# Patient Record
Sex: Female | Born: 1955 | Race: White | Hispanic: No | Marital: Married
Health system: Southern US, Academic
[De-identification: ages and names within clinical notes are randomized; demographics above are authoritative.]

## PROBLEM LIST (undated history)

## (undated) ENCOUNTER — Ambulatory Visit

## (undated) ENCOUNTER — Encounter

## (undated) ENCOUNTER — Ambulatory Visit
Payer: MEDICARE | Attending: Student in an Organized Health Care Education/Training Program | Primary: Student in an Organized Health Care Education/Training Program

## (undated) ENCOUNTER — Ambulatory Visit: Payer: MEDICARE | Attending: Rheumatology | Primary: Rheumatology

## (undated) ENCOUNTER — Telehealth

## (undated) ENCOUNTER — Encounter: Attending: Dermatology | Primary: Dermatology

## (undated) ENCOUNTER — Ambulatory Visit: Payer: MEDICARE

## (undated) ENCOUNTER — Encounter
Attending: Student in an Organized Health Care Education/Training Program | Primary: Student in an Organized Health Care Education/Training Program

## (undated) DIAGNOSIS — Q2112 Patent foramen ovale: Secondary | ICD-10-CM

## (undated) DIAGNOSIS — I471 Supraventricular tachycardia: Secondary | ICD-10-CM

## (undated) DIAGNOSIS — I341 Nonrheumatic mitral (valve) prolapse: Secondary | ICD-10-CM

## (undated) DIAGNOSIS — T4145XA Adverse effect of unspecified anesthetic, initial encounter: Secondary | ICD-10-CM

## (undated) DIAGNOSIS — E063 Autoimmune thyroiditis: Secondary | ICD-10-CM

## (undated) DIAGNOSIS — E781 Pure hyperglyceridemia: Secondary | ICD-10-CM

## (undated) DIAGNOSIS — M549 Dorsalgia, unspecified: Secondary | ICD-10-CM

## (undated) DIAGNOSIS — R5382 Chronic fatigue, unspecified: Secondary | ICD-10-CM

## (undated) DIAGNOSIS — I4719 Other supraventricular tachycardia: Secondary | ICD-10-CM

## (undated) DIAGNOSIS — M310 Hypersensitivity angiitis: Secondary | ICD-10-CM

## (undated) DIAGNOSIS — L719 Rosacea, unspecified: Secondary | ICD-10-CM

## (undated) DIAGNOSIS — C55 Malignant neoplasm of uterus, part unspecified: Secondary | ICD-10-CM

## (undated) DIAGNOSIS — K219 Gastro-esophageal reflux disease without esophagitis: Secondary | ICD-10-CM

## (undated) DIAGNOSIS — Z9889 Other specified postprocedural states: Secondary | ICD-10-CM

## (undated) DIAGNOSIS — C541 Malignant neoplasm of endometrium: Secondary | ICD-10-CM

## (undated) DIAGNOSIS — G9332 Myalgic encephalomyelitis/chronic fatigue syndrome: Secondary | ICD-10-CM

## (undated) DIAGNOSIS — IMO0001 Reserved for inherently not codable concepts without codable children: Secondary | ICD-10-CM

## (undated) DIAGNOSIS — R05 Cough: Secondary | ICD-10-CM

## (undated) DIAGNOSIS — R413 Other amnesia: Secondary | ICD-10-CM

## (undated) DIAGNOSIS — D510 Vitamin B12 deficiency anemia due to intrinsic factor deficiency: Secondary | ICD-10-CM

## (undated) DIAGNOSIS — J45909 Unspecified asthma, uncomplicated: Secondary | ICD-10-CM

## (undated) DIAGNOSIS — M79673 Pain in unspecified foot: Secondary | ICD-10-CM

## (undated) DIAGNOSIS — R112 Nausea with vomiting, unspecified: Secondary | ICD-10-CM

## (undated) DIAGNOSIS — T8859XA Other complications of anesthesia, initial encounter: Secondary | ICD-10-CM

## (undated) DIAGNOSIS — M797 Fibromyalgia: Secondary | ICD-10-CM

## (undated) DIAGNOSIS — G2581 Restless legs syndrome: Secondary | ICD-10-CM

## (undated) DIAGNOSIS — E039 Hypothyroidism, unspecified: Secondary | ICD-10-CM

## (undated) DIAGNOSIS — R059 Cough, unspecified: Secondary | ICD-10-CM

## (undated) DIAGNOSIS — G4730 Sleep apnea, unspecified: Secondary | ICD-10-CM

## (undated) DIAGNOSIS — E786 Lipoprotein deficiency: Secondary | ICD-10-CM

## (undated) DIAGNOSIS — I499 Cardiac arrhythmia, unspecified: Secondary | ICD-10-CM

## (undated) DIAGNOSIS — K224 Dyskinesia of esophagus: Secondary | ICD-10-CM

## (undated) HISTORY — PX: BUNIONECTOMY: SHX129

## (undated) HISTORY — DX: Restless legs syndrome: G25.81

## (undated) HISTORY — DX: Supraventricular tachycardia: I47.1

## (undated) HISTORY — DX: Dorsalgia, unspecified: M54.9

## (undated) HISTORY — DX: Malignant neoplasm of uterus, part unspecified: C55

## (undated) HISTORY — DX: Myalgic encephalomyelitis/chronic fatigue syndrome: G93.32

## (undated) HISTORY — DX: Gastro-esophageal reflux disease without esophagitis: K21.9

## (undated) HISTORY — PX: US ECHOCARDIOGRAPHY: HXRAD669

## (undated) HISTORY — DX: Reserved for inherently not codable concepts without codable children: IMO0001

## (undated) HISTORY — DX: Autoimmune thyroiditis: E06.3

## (undated) HISTORY — DX: Other amnesia: R41.3

## (undated) HISTORY — PX: LUNG BIOPSY: SHX5088

## (undated) HISTORY — PX: HAMMER TOE SURGERY: SHX385

## (undated) HISTORY — PX: METATARSAL OSTEOTOMY: SHX1641

## (undated) HISTORY — DX: Lipoprotein deficiency: E78.6

## (undated) HISTORY — DX: Cough, unspecified: R05.9

## (undated) HISTORY — PX: NECK SURGERY: SHX720

## (undated) HISTORY — DX: Malignant neoplasm of endometrium: C54.1

## (undated) HISTORY — PX: COLONOSCOPY: SHX5424

## (undated) HISTORY — PX: VESICOVAGINAL FISTULA CLOSURE W/ TAH: SUR271

## (undated) HISTORY — PX: OTHER SURGICAL HISTORY: SHX169

## (undated) HISTORY — PX: TONSILLECTOMY: SUR1361

## (undated) HISTORY — DX: Other supraventricular tachycardia: I47.19

## (undated) HISTORY — DX: Pain in unspecified foot: M79.673

## (undated) HISTORY — DX: Dyskinesia of esophagus: K22.4

## (undated) HISTORY — DX: Chronic fatigue, unspecified: R53.82

## (undated) HISTORY — DX: Pure hyperglyceridemia: E78.1

## (undated) HISTORY — DX: Hypothyroidism, unspecified: E03.9

## (undated) HISTORY — PX: CHOLECYSTECTOMY: SHX55

## (undated) HISTORY — DX: Hypersensitivity angiitis: M31.0

## (undated) HISTORY — DX: Rosacea, unspecified: L71.9

## (undated) HISTORY — DX: Vitamin B12 deficiency anemia due to intrinsic factor deficiency: D51.0

## (undated) HISTORY — DX: Cough: R05

## (undated) HISTORY — PX: ABDOMINAL HYSTERECTOMY: SHX81

---
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
## 1998-01-20 ENCOUNTER — Other Ambulatory Visit: Admission: RE | Admit: 1998-01-20 | Discharge: 1998-01-20 | Payer: Self-pay | Admitting: *Deleted

## 1998-05-24 ENCOUNTER — Ambulatory Visit (HOSPITAL_COMMUNITY): Admission: RE | Admit: 1998-05-24 | Discharge: 1998-05-24 | Payer: Self-pay | Admitting: Family Medicine

## 1998-09-07 ENCOUNTER — Other Ambulatory Visit: Admission: RE | Admit: 1998-09-07 | Discharge: 1998-09-07 | Payer: Self-pay | Admitting: *Deleted

## 1999-03-19 ENCOUNTER — Emergency Department (HOSPITAL_COMMUNITY): Admission: EM | Admit: 1999-03-19 | Discharge: 1999-03-19 | Payer: Self-pay | Admitting: *Deleted

## 1999-03-19 ENCOUNTER — Encounter: Payer: Self-pay | Admitting: Emergency Medicine

## 1999-09-26 ENCOUNTER — Other Ambulatory Visit: Admission: RE | Admit: 1999-09-26 | Discharge: 1999-09-26 | Payer: Self-pay | Admitting: Obstetrics & Gynecology

## 2000-06-01 ENCOUNTER — Encounter: Payer: Self-pay | Admitting: Emergency Medicine

## 2000-06-01 ENCOUNTER — Emergency Department (HOSPITAL_COMMUNITY): Admission: EM | Admit: 2000-06-01 | Discharge: 2000-06-02 | Payer: Self-pay | Admitting: Emergency Medicine

## 2000-08-16 ENCOUNTER — Ambulatory Visit (HOSPITAL_COMMUNITY): Admission: RE | Admit: 2000-08-16 | Discharge: 2000-08-16 | Payer: Self-pay | Admitting: Gastroenterology

## 2000-08-16 ENCOUNTER — Encounter (INDEPENDENT_AMBULATORY_CARE_PROVIDER_SITE_OTHER): Payer: Self-pay | Admitting: Specialist

## 2000-11-22 ENCOUNTER — Other Ambulatory Visit: Admission: RE | Admit: 2000-11-22 | Discharge: 2000-11-22 | Payer: Self-pay | Admitting: *Deleted

## 2001-12-12 ENCOUNTER — Encounter: Admission: RE | Admit: 2001-12-12 | Discharge: 2001-12-12 | Payer: Self-pay | Admitting: Gastroenterology

## 2001-12-12 ENCOUNTER — Encounter: Payer: Self-pay | Admitting: Gastroenterology

## 2003-07-07 ENCOUNTER — Encounter: Admission: RE | Admit: 2003-07-07 | Discharge: 2003-10-05 | Payer: Self-pay | Admitting: Family Medicine

## 2003-07-25 ENCOUNTER — Emergency Department (HOSPITAL_COMMUNITY): Admission: EM | Admit: 2003-07-25 | Discharge: 2003-07-25 | Payer: Self-pay | Admitting: Emergency Medicine

## 2003-07-25 ENCOUNTER — Encounter: Payer: Self-pay | Admitting: Emergency Medicine

## 2003-12-23 ENCOUNTER — Encounter: Admission: RE | Admit: 2003-12-23 | Discharge: 2003-12-23 | Payer: Self-pay | Admitting: Family Medicine

## 2004-01-06 ENCOUNTER — Encounter: Admission: RE | Admit: 2004-01-06 | Discharge: 2004-01-06 | Payer: Self-pay | Admitting: Family Medicine

## 2005-03-02 ENCOUNTER — Ambulatory Visit (HOSPITAL_BASED_OUTPATIENT_CLINIC_OR_DEPARTMENT_OTHER): Admission: RE | Admit: 2005-03-02 | Discharge: 2005-03-02 | Payer: Self-pay | Admitting: Urology

## 2005-07-19 ENCOUNTER — Ambulatory Visit (HOSPITAL_COMMUNITY): Admission: RE | Admit: 2005-07-19 | Discharge: 2005-07-19 | Payer: Self-pay | Admitting: Family Medicine

## 2005-08-22 ENCOUNTER — Ambulatory Visit (HOSPITAL_COMMUNITY): Admission: RE | Admit: 2005-08-22 | Discharge: 2005-08-22 | Payer: Self-pay | Admitting: Gastroenterology

## 2005-08-22 ENCOUNTER — Encounter (INDEPENDENT_AMBULATORY_CARE_PROVIDER_SITE_OTHER): Payer: Self-pay | Admitting: *Deleted

## 2006-10-30 HISTORY — PX: LYMPH NODE DISSECTION: SHX5087

## 2006-10-30 HISTORY — PX: HYSTERECTOMY ABDOMINAL WITH SALPINGO-OOPHORECTOMY: SHX6792

## 2007-01-02 ENCOUNTER — Ambulatory Visit (HOSPITAL_BASED_OUTPATIENT_CLINIC_OR_DEPARTMENT_OTHER): Admission: RE | Admit: 2007-01-02 | Discharge: 2007-01-02 | Payer: Self-pay | Admitting: Urology

## 2007-10-31 HISTORY — PX: CHOLECYSTECTOMY: SHX55

## 2007-12-19 ENCOUNTER — Inpatient Hospital Stay (HOSPITAL_COMMUNITY): Admission: RE | Admit: 2007-12-19 | Discharge: 2007-12-20 | Payer: Self-pay | Admitting: Orthopedic Surgery

## 2008-02-19 ENCOUNTER — Ambulatory Visit (HOSPITAL_COMMUNITY): Admission: RE | Admit: 2008-02-19 | Discharge: 2008-02-19 | Payer: Self-pay | Admitting: Dermatology

## 2008-02-19 ENCOUNTER — Encounter (INDEPENDENT_AMBULATORY_CARE_PROVIDER_SITE_OTHER): Payer: Self-pay | Admitting: Interventional Radiology

## 2008-05-13 ENCOUNTER — Ambulatory Visit (HOSPITAL_BASED_OUTPATIENT_CLINIC_OR_DEPARTMENT_OTHER): Admission: RE | Admit: 2008-05-13 | Discharge: 2008-05-13 | Payer: Self-pay | Admitting: Urology

## 2009-02-11 ENCOUNTER — Encounter: Admission: RE | Admit: 2009-02-11 | Discharge: 2009-02-11 | Payer: Self-pay | Admitting: Gastroenterology

## 2009-02-16 ENCOUNTER — Encounter (INDEPENDENT_AMBULATORY_CARE_PROVIDER_SITE_OTHER): Payer: Self-pay | Admitting: General Surgery

## 2009-02-16 ENCOUNTER — Ambulatory Visit (HOSPITAL_COMMUNITY): Admission: RE | Admit: 2009-02-16 | Discharge: 2009-02-17 | Payer: Self-pay | Admitting: General Surgery

## 2009-05-05 ENCOUNTER — Ambulatory Visit (HOSPITAL_BASED_OUTPATIENT_CLINIC_OR_DEPARTMENT_OTHER): Admission: RE | Admit: 2009-05-05 | Discharge: 2009-05-06 | Payer: Self-pay | Admitting: Urology

## 2010-05-26 ENCOUNTER — Encounter: Admission: RE | Admit: 2010-05-26 | Discharge: 2010-05-26 | Payer: Self-pay | Admitting: Family Medicine

## 2010-06-23 ENCOUNTER — Ambulatory Visit: Payer: Self-pay | Admitting: Internal Medicine

## 2010-06-24 ENCOUNTER — Encounter: Payer: Self-pay | Admitting: Internal Medicine

## 2010-11-21 ENCOUNTER — Encounter
Admission: RE | Admit: 2010-11-21 | Discharge: 2010-11-21 | Payer: Self-pay | Source: Home / Self Care | Attending: Obstetrics and Gynecology | Admitting: Obstetrics and Gynecology

## 2010-12-01 NOTE — Miscellaneous (Signed)
Summary: Orders Update pft charges  Clinical Lists Changes  Orders: Added new Service order of Carbon Monoxide diffusing w/capacity (94720) - Signed Added new Service order of Lung Volumes (94240) - Signed Added new Service order of Spirometry (Pre & Post) (94060) - Signed 

## 2011-01-16 ENCOUNTER — Ambulatory Visit
Admission: RE | Admit: 2011-01-16 | Discharge: 2011-01-16 | Disposition: A | Discharge status: Home / Self Care | Payer: Self-pay | Source: Ambulatory Visit | Attending: Orthopedic Surgery | Admitting: Orthopedic Surgery

## 2011-01-16 ENCOUNTER — Other Ambulatory Visit: Payer: Self-pay | Admitting: Orthopedic Surgery

## 2011-01-16 DIAGNOSIS — M5090 Cervical disc disorder, unspecified, unspecified cervical region: Secondary | ICD-10-CM

## 2011-01-27 ENCOUNTER — Other Ambulatory Visit: Payer: Self-pay | Admitting: Orthopedic Surgery

## 2011-01-27 DIAGNOSIS — M542 Cervicalgia: Secondary | ICD-10-CM

## 2011-01-31 ENCOUNTER — Ambulatory Visit
Admission: RE | Admit: 2011-01-31 | Discharge: 2011-01-31 | Disposition: A | Discharge status: Home / Self Care | Payer: Medicare Other | Source: Ambulatory Visit | Attending: Orthopedic Surgery | Admitting: Orthopedic Surgery

## 2011-01-31 DIAGNOSIS — M542 Cervicalgia: Secondary | ICD-10-CM

## 2011-02-05 LAB — POCT I-STAT 4, (NA,K, GLUC, HGB,HCT)
HCT: 43 % (ref 36.0–46.0)
Sodium: 141 mEq/L (ref 135–145)

## 2011-02-08 LAB — DIFFERENTIAL
Eosinophils Absolute: 0.2 10*3/uL (ref 0.0–0.7)
Eosinophils Relative: 3 % (ref 0–5)
Lymphs Abs: 1.4 10*3/uL (ref 0.7–4.0)
Monocytes Relative: 8 % (ref 3–12)
Neutro Abs: 5 10*3/uL (ref 1.7–7.7)
Neutrophils Relative %: 69 % (ref 43–77)

## 2011-02-08 LAB — COMPREHENSIVE METABOLIC PANEL
ALT: 16 U/L (ref 0–35)
Albumin: 4 g/dL (ref 3.5–5.2)
BUN: 10 mg/dL (ref 6–23)
CO2: 29 mEq/L (ref 19–32)
Calcium: 9.2 mg/dL (ref 8.4–10.5)
Chloride: 102 mEq/L (ref 96–112)
Creatinine, Ser: 1.01 mg/dL (ref 0.4–1.2)
Glucose, Bld: 96 mg/dL (ref 70–99)
Total Protein: 7.4 g/dL (ref 6.0–8.3)

## 2011-02-08 LAB — CBC
MCHC: 34.5 g/dL (ref 30.0–36.0)
MCV: 89.1 fL (ref 78.0–100.0)
Platelets: 331 10*3/uL (ref 150–400)
RDW: 12.6 % (ref 11.5–15.5)
WBC: 7.2 10*3/uL (ref 4.0–10.5)

## 2011-03-01 ENCOUNTER — Inpatient Hospital Stay (HOSPITAL_COMMUNITY)
Admission: RE | Admit: 2011-03-01 | Discharge: 2011-03-02 | DRG: 473 | Disposition: A | Discharge status: Home / Self Care | Payer: PRIVATE HEALTH INSURANCE | Source: Ambulatory Visit | Attending: Orthopedic Surgery | Admitting: Orthopedic Surgery

## 2011-03-01 ENCOUNTER — Ambulatory Visit (HOSPITAL_COMMUNITY): Payer: PRIVATE HEALTH INSURANCE

## 2011-03-01 DIAGNOSIS — M5380 Other specified dorsopathies, site unspecified: Secondary | ICD-10-CM | POA: Diagnosis present

## 2011-03-01 DIAGNOSIS — K219 Gastro-esophageal reflux disease without esophagitis: Secondary | ICD-10-CM | POA: Diagnosis present

## 2011-03-01 DIAGNOSIS — K589 Irritable bowel syndrome without diarrhea: Secondary | ICD-10-CM | POA: Diagnosis present

## 2011-03-01 DIAGNOSIS — IMO0001 Reserved for inherently not codable concepts without codable children: Secondary | ICD-10-CM | POA: Diagnosis present

## 2011-03-01 DIAGNOSIS — D649 Anemia, unspecified: Secondary | ICD-10-CM | POA: Diagnosis present

## 2011-03-01 DIAGNOSIS — J45909 Unspecified asthma, uncomplicated: Secondary | ICD-10-CM | POA: Diagnosis present

## 2011-03-01 DIAGNOSIS — F329 Major depressive disorder, single episode, unspecified: Secondary | ICD-10-CM | POA: Diagnosis present

## 2011-03-01 DIAGNOSIS — Z79899 Other long term (current) drug therapy: Secondary | ICD-10-CM

## 2011-03-01 DIAGNOSIS — M5020 Other cervical disc displacement, unspecified cervical region: Principal | ICD-10-CM | POA: Diagnosis present

## 2011-03-01 DIAGNOSIS — F3289 Other specified depressive episodes: Secondary | ICD-10-CM | POA: Diagnosis present

## 2011-03-01 LAB — CBC
HCT: 36.6 % (ref 36.0–46.0)
Hemoglobin: 12.7 g/dL (ref 12.0–15.0)
Hemoglobin: 13 g/dL (ref 12.0–15.0)
MCHC: 34.4 g/dL (ref 30.0–36.0)
Platelets: 391 10*3/uL (ref 150–400)
RBC: 3.86 MIL/uL — ABNORMAL LOW (ref 3.87–5.11)
RDW: 14.6 % (ref 11.5–15.5)
RDW: 14.5 % (ref 11.5–15.5)
WBC: 11.7 10*3/uL — ABNORMAL HIGH (ref 4.0–10.5)

## 2011-03-01 LAB — BASIC METABOLIC PANEL
Calcium: 9.7 mg/dL (ref 8.4–10.5)
Calcium: 9.7 mg/dL (ref 8.4–10.5)
Chloride: 100 mEq/L (ref 96–112)
Creatinine, Ser: 0.9 mg/dL (ref 0.4–1.2)
GFR calc Af Amer: >60 mL/min (ref >60)
GFR calc non Af Amer: >60 mL/min (ref >60)
GFR calc non Af Amer: >60 mL/min (ref >60)
Potassium: 4.3 mEq/L (ref 3.5–5.1)
Potassium: 4.5 mEq/L (ref 3.5–5.1)
Sodium: 136 mEq/L (ref 135–145)

## 2011-03-01 LAB — SURGICAL PCR SCREEN: MRSA, PCR: NEGATIVE

## 2011-03-02 NOTE — Op Note (Signed)
[name], [name]                  ACCOUNT NO.:  [id number]  MEDICAL RECORD NO.:  [id number]           PATIENT TYPE:  I  LOCATION:  5011                         FACILITY:  [facility]  PHYSICIAN:  Alvy Beal, MD    DATE OF BIRTH:  February 10, 1956  DATE OF PROCEDURE:  03/01/2011 DATE OF DISCHARGE:                              OPERATIVE REPORT   PREOPERATIVE DIAGNOSIS:  C6-7 disk herniation.  POSTOPERATIVE DIAGNOSIS:  C6-7 disk herniation.  OPERATIVE PROCEDURE:  Removal of exostosis at C5-6, 2ACDF at C6-7.  COMPLICATIONS:  None.  CONDITION:  Stable.  HISTORY:  This is a very pleasant 55 year old who 3 years ago had a C5-6 total disk replacement, has done exceptionally well.  Recently, she started having significant left arm pain, weakness, and severe pain. Clinically, she had weakness in the triceps in the C7 distribution on the left side as well as numbness and dysesthesias.  CT myelogram demonstrated the disk herniation at C6-7.  As a result, we elected to proceed with surgery.  All appropriate risks, benefits, and alternatives were discussed with the patient.  Consent was obtained.  INSTRUMENTATION SYSTEM USED:  Synthes zero profile anterior cervical PEEK cage and plate with a 04-VW down screw and 14 mm up screw.  OPERATIVE NOTE:  The patient was brought to the operating room, placed supine on the operating table.  After successful induction of general anesthesia and endotracheal intubation, TEDs and SCDs were applied. Rolled towels were placed in the shoulders.  The shoulders were taped down.  Neck was prepped and draped in standard fashion.  Appropriate time-out was then done to confirm patient and procedure, affected extremity, and all other pertinent important data.  Because of the previous left-sided approach, I elected to go on the right in order to decrease the scar mobilization.  She did have preoperative ENT clearance, which demonstrated normal vocal cord motion.   Midline incision and transverse was made over the C6-7 disk space.  Sharp dissection was carried out down deep to the platysma.  Platysma was sharply incised.  I continued dissecting along the medial border of sternocleidomastoid.  I identified the carotid sheath and protected it laterally with a finger, mobilize the trachea and esophagus medially, and protected it with a thyroid retractor.  I then dissected down to the remaining deep cervical and prevertebral fascia to the anterior longitudinal ligament.  At this point, I could clearly visualize the anterior cervical spine.  I placed a needle into the C6-7 disk and confirmed that I was at the appropriate level.  I then mobilized the anterior longitudinal ligament at the level of the disk space.  I then mobilized the longus coli muscles out laterally to expose the uncovertebral joints.  Self-retaining Caspar retractor was placed in the wound, expanded, endotracheal cuff was deflated and reinflated.  It was held in place and then distraction pins were placed into bodies of C6-7 and C6-7 disk space was distracted.  An annulotomy was performed with 15- blade scalpel and then I used a combination to a tear pituitary rongeurs, curettes, and Kerrison rongeurs to remove all of the  disk material.  There was a fragment of disk material in the posterior lateral aspect on the left side, which I was able to remove with a micropituitary rongeur.  I then took down the osteophyte.  I then used the nerve hook to create a defect in the posterior longitudinal ligament and then resected the posterior longitudinal ligament with a #1 Kerrison punch.  At this point, I can then pass the nerve hook underneath the uncovertebral joints bilaterally and centrally and there was no further compression.  The thecal sac and nerve root were well decompressed.  I rasped the endplates, I had bleeding bone, and then I trialed with the spacers, took a 7 mm lordotic zero  profile plate, packed it with 2 mL of Osteocel mixed with DBX and malleted into position.  I had excellent positioning.  I then used an awl and placed a 40-mm screw through the plate and into the C6 vertebral body and 16-mm screw into the C7 vertebral body.  The screws were torqued to the appropriate depth.  I then evaluated C5-6 prosthesis.  There was a small osteophyte, which I was able to resect with 1-mm Kerrison.  I did not do an aggressive exostosis for fear of damaging the disk prosthesis itself.  I was able to remove some osteophyte.  I irrigated copiously normal saline, returned the trachea and esophagus midline, closed the platysma with interrupted 2-0 Vicryl sutures and 3-0 Monocryl for the skin.  Steri- Strips and dry dressing were applied.  The patient was extubated and transferred to PACU without incident.  At the end of the case, all needle and sponge counts were correct.     Alvy Beal, MD     DDB/MEDQ  D:  03/01/2011  T:  03/02/2011  Job:  [id number]  Electronically Signed by Venita Lick MD on 03/02/2011 09:42:09 PM

## 2011-03-14 NOTE — Op Note (Signed)
NAME:  [name], [name]                  ACCOUNT NO.:  [id number]   MEDICAL RECORD NO.:  [id number]          PATIENT TYPE:  OIB   LOCATION:  5118                         FACILITY:  [facility]   PHYSICIAN:  Ollen Gross. Vernell Morgans, M.D. DATE OF BIRTH:  [birth year]   DATE OF PROCEDURE:  02/16/2009  DATE OF DISCHARGE:                               OPERATIVE REPORT   PREOPERATIVE DIAGNOSIS:  Gallstones.   POSTOPERATIVE DIAGNOSIS:  Gallstones.   PROCEDURE:  Laparoscopic cholecystectomy with intraoperative  cholangiogram.   SURGEON:  Ollen Gross. Vernell Morgans, MD   ASSISTANT:  Anselm Pancoast. Zachery Dakins, MD   ANESTHESIA:  General endotracheal.   PROCEDURE:  After informed consent was obtained, the patient was brought  to the operating room and placed in a supine position on the operating  room table.  After adequate induction of general anesthesia, the  patient's abdomen was prepped with ChloraPrep and allowed to dry and  then draped in usual sterile fashion.  The area below the umbilicus was  infiltrated with 0.25% Marcaine.  A small incision was made with a 15  blade knife.  This incision was carried down through the subcutaneous  tissue bluntly with a hemostat and [employer] retractors until the linea  alba was identified.  The linea alba was incised with a 15 blade knife  and each side was grasped with Kocher clamps and elevated anteriorly.  The preperitoneal space was then probed bluntly with a hemostat until  the peritoneum was opened and access was gained to the abdominal cavity.  A 0 Vicryl pursestring stitch was placed in the fascia around the  opening.  A Hasson cannula was placed through the opening and anchored  in place with a previously placed Vicryl pursestring stitch.  The  abdomen was then insufflated with carbon dioxide without difficulty.  The patient was placed in reverse Trendelenburg position and rotated  with the right side up.  A laparoscope was then inserted through the  Hasson cannula and  the right upper quadrant was inspected.  The dome of  the gallbladder and liver were readily identified.  Next, the epigastric  region was infiltrated with 0.25% Marcaine.  A small incision was made  with 15 blade knife and 10 and 11 mm port was placed bluntly through  this incision into the abdominal cavity under direct vision.  Sites were  then chosen on the right side of the abdomen for placement of 5-mm  ports.  Each of these areas was infiltrated with 0.25% Marcaine.  Small  stab incisions were made with a 15 blade knife and 5 mm ports were  placed bluntly through these incisions into the abdominal cavity under  direct vision.  The patient's abdomen was actually very free of  adhesions on the anterior abdominal wall even though she had had  previous abdominal surgery.  A blunt grasper was placed at the lateral  most 5 mm port and used to grasp the dome of the gallbladder and  elevated anteriorly and superiorly.  Another blunt grasper was placed  through the other 5 mm port and used  to retract the body and neck of the  gallbladder.  A small filmy adhesion along the body and down near the  neck of the gallbladder was taken down sharply with laparoscopic  scissors.  A blunt dissector was then placed through the epigastric port  and using the electrocautery, the peritoneal reflection at the  gallbladder neck was opened.  Blunt dissection was then carried out in  this area until the gallbladder neck cystic duct junction was readily  identified and a good window was created.  A single clip was placed on  the gallbladder neck.  A small ductotomy was made just below the clip  with laparoscopic scissors.  A Cook catheter was then placed  percutaneously through the anterior abdominal wall under direct vision.  The [facility] catheter was then flushed and then placed within the cystic  duct and anchored in place with a clip.  Cholangiogram was obtained that  showed no filling defects, good emptying in  duodenum and good length on  the cystic duct.  The anchoring clip and catheters were then removed  from the patient.  Three clips were placed proximally on the cystic duct  and divided.  The dome was divided between the two sets of clips.  Posterior to this, the cystic artery was identified and again dissected  bluntly in circumferential manner until a good window was created.  Two  clips were placed proximally on the artery and one distally and the  artery was divided between the two.  Next, a laparoscopic hook cautery  device was used to separate the gallbladder from liver bed.  Prior to  completely detaching all of gallbladder from liver bed, the liver bed  was inspected and several small bleeding points were coagulated with the  electrocautery until the area was completely hemostatic.  The  gallbladder was then detached and wrested away from the liver bed  without difficulty with the hook cautery.  A laparoscopic bag was  inserted through the epigastric port.  The gallbladder was placed in the  bag and the bag was sealed.  The abdomen was then irrigated with copious  amounts of saline until the effluent was clear.  The liver bed was  inspected again and looked good and found to be hemostatic.  The  laparoscope was then removed from the epigastric port.  A gallbladder  grasper was placed through the Hasson cannula used to grasp and open the  bag.  The bag with the gallbladder was removed with the Hasson cannula  through the infraumbilical port without difficulty.  The fascial defect  was closed with the previously placed Vicryl pursestring stitch as well  as with another figure-of-eight 0 Vicryl stitch.  The rest of the ports  were removed under direct vision and were found to be hemostatic.  The  skin incisions were all closed with interrupted 4-0 Monocryl  subcuticular stitches and Dermabond dressings were applied.  The patient  tolerated the procedure well.  At the end of the case  all needle, sponge  and instrument counts were correct.  The patient was then extubated and  taken to recovery in stable condition.      Ollen Gross. Vernell Morgans, M.D.  Electronically Signed     PST/MEDQ  D:  02/16/2009  T:  02/16/2009  Job:  [id number]

## 2011-03-14 NOTE — Op Note (Signed)
[name], [name]                  ACCOUNT NO.:  [id number]   MEDICAL RECORD NO.:  [id number]          PATIENT TYPE:  AMB   LOCATION:  SDS                          FACILITY:  [facility]   PHYSICIAN:  Alvy Beal, MD    DATE OF BIRTH:  11/26/1955   DATE OF PROCEDURE:  12/19/2007  DATE OF DISCHARGE:                               OPERATIVE REPORT   PREOPERATIVE DIAGNOSIS:  C5-6 disk herniation with left C6 radiculopathy  and weakness.   POSTOPERATIVE DIAGNOSIS:  C5-6 disk herniation with left C6  radiculopathy and weakness.   OPERATIVE PROCEDURE:  Cervical total disk replacement, C5-6.   FIRST ASSISTANT:  Crissie Reese, PA   COMPLICATIONS:  None.   INSTRUMENTATION USED:  Synthes ProDisc-C, size 5.   HISTORY:  Is a very pleasant 55 year old woman who presents with acute  onset of severe neck and left arm pain.  Clinical and radiographic  analysis confirmed the diagnosis of C6 radicular weakness and pain as  well as a C5-6 06 disk herniation posterolateral to the left.  As a  result, appropriate treatment modalities were discussed with the patient  and husband and she elected to proceed with surgery.  All appropriate  risks, benefits and alternatives to the case were discussed with the  patient and her husband and consent was obtained.   OPERATIVE NOTE:  The patient is brought to the operating room, placed  supine on the operating table.  After successful induction of general  anesthesia and endotracheal intubation, TED SCDs were applied.  Rolled  towels were placed between the shoulder blades.  The shoulders  themselves were taped down and the neck was put into a gentle extension.  The anterior cervical spine was prepped and draped in standard fashion.  A left transverse incision was then made just below the thyroid  cartilage.  Sharp dissection was carried out down to the platysma.  The  platysma was sharply incised in accordance with a standard Clementeen Graham approach to  the anterior cervical spine.  I then identified the  omohyoid.  Because this was directly crossing the field, I elected to  sacrifice it so we could improve visualization of the C5-6 disk space.  The omohyoid was sacrificed and hemostasis was obtained.  I then was  able to sharply dissect through the deep cervical and prevertebral  fascia onto the anterior aspect of the spine.  At this point I was able  to sweep the trachea and esophagus medially and protect the carotid  sheath laterally.  I then placed a needle into the C5-6 space, took a  lateral fluoroscopy view and confirmed the appropriate position.   Once confirmed, I then I then mobilized the longus colli muscles  bilaterally out to the level of uncovertebrals.  At this point I had the  C5-6 0506 disk space clearly visualize from uncovertebral joint to  uncovertebral joint.  I then in the AP plane found the midline utilizing  the spinous processes and marker.  I then placed the awl into the  midline position and a distraction pin into the  bodies of C5 and C6.  In  the lateral plane it was assured that the pins were parallel to the  inferior endplate of C5 and the superior endplate of C6, respectively.  It should be noted that prior to this I did place the shadow line  retractor system into the wound, deflating the endotracheal cuff, and  expanded it to the appropriate length.  At this point I had excellent  mediolateral, superoinferior visualization of the C5-6 disk space.   At this point with the retractors in place, I then incised the annulus  with a 15 blade scalpel.   Using a combination of pituitary rongeur, Leksell rongeurs, curettes and  Kerrisons, I resected all of the disk material.  There was a sizeable  posterolateral central in both to the left disk herniation, which I was  able to remove.  I then developed a plane underneath the posterior  longitudinal ligament and then resected it with a 1-mm Kerrison.  At  this  point I was able to sweep a nerve hook behind the bodies of C5 and  C6 and out over into the uncovertebral joints to assure that there were  no retained free fragments of material or significant osteophytes.   The wound was copiously irrigated with normal saline and then we began  trialling.   Once at the appropriate-size trial, I checked its position in the AP and  lateral planes, confirming depth and center of position.  Once confirmed  I placed the milling guide over it and then milled the keel cuts  superior and inferior.  I then used the chisel to ensure that I had the  appropriate depth of the keel.  Once confirmed, I then cleaned out the  keel cuts and then obtained a size 5 prosthesis.  I then malleted the 5  prosthesis down to the appropriate depth.  I then irrigated copiously  with normal saline and then removed all the retracting devices and used  bone wax to ensure that there was no ongoing bleeding.  The keel cuts  and distraction pin holes were sealed with bone wax and any soft tissue  bleeders were coagulated with bipolar electrocautery.  I irrigated the  wound copiously with normal saline and at no point did I have any  significant blood-tinged irrigant coming out of the wound.  I then under  live fluoroscopy took the neck through a full range of motion and the  prosthesis itself was noted to be functioning adequately.  At this point  I then took final AP and lateral views and placed the trachea and  esophagus back to the midline.  I then closed the platysma with  interrupted 2-0 Vicryl sutures and skin with 3-0 Monocryl.  Steri-Strips  and dry dressing were applied.  The patient was extubated and put into a  soft collar and transferred to the PACU without incident.  At the end of  the case all needle and sponge counts were correct.      Alvy Beal, MD  Electronically Signed     DDB/MEDQ  D:  12/19/2007  T:  12/20/2007  Job:  [id number]

## 2011-03-14 NOTE — Op Note (Signed)
[name], BARE NO.:  [id number]   MEDICAL RECORD NO.:  [id number]           PATIENT TYPE:   LOCATION:                                 FACILITY:   PHYSICIAN:  Valetta Fuller, M.D.  DATE OF BIRTH:  16-Dec-1955   DATE OF PROCEDURE:  05/05/2009  DATE OF DISCHARGE:                               OPERATIVE REPORT   PREOPERATIVE DIAGNOSIS:  Stress urinary incontinence.   POSTOPERATIVE DIAGNOSIS:  Stress urinary incontinence.   PROCEDURE PERFORMED:  1. Transvaginal tape mid-urethral sling Virgina Jock).  2. Flexible cystourethroscopy.   SURGEON:  Dr. Barron Alvine.   RESIDENT:  Magda Paganini.   ANESTHESIA:  General endotracheal anesthesia.   DRAINS:  18-French Foley catheter to gravity.   COMPLICATIONS:  None.   INDICATIONS FOR PROCEDURE:  [name] is a 55 year old Caucasian female  who underwent a mid-urethral sling approximately 2 years ago.  Initially  she did well.  However, 6 months afterwards underwent a hysterectomy and  then subsequently began developing progression and recurrence of her  stress urinary incontinence.  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  She has interstitial cystitis, as well, which is at its  normal baseline level.  She was counseled on treatment options and  elected to proceed with a redo mid-urethral sling.   DESCRIPTION OF PROCEDURE IN DETAIL:  [name] was brought back to the  operating room, correctly identified via wrist band.  A preoperative  time-out was called to confirm the correct patient, procedure and site.  After successful induction of general endotracheal anesthesia, she was  positioned in the dorsal lithotomy position and great efforts were made  to reduce any pressure on bony prominences.  Intravenous antibiotics  were administered.  Her perineum and lower abdomen were prepped and  draped in the usual sterile fashion.  Surgeons were  sterilely gowned and  gloved.   A weighted vaginal speculum was placed.  A mixture of epinephrine and  saline was used to hydrodissect the anterior vaginal wall.  We then  proceeded to make approximately a 1-inch incision in the anterior  vaginal wall.  We then dissected, using sharp and blunt dissection,  towards the patient's right shoulder, breaking through the endopelvic  fascia with blunt dissection behind the pubic bone.  We then performed a  similar dissection on the left side, breaking through the endopelvic  fascia with blunt dissection, up towards the left shoulder, feeling for  the inferior pubic ramus.  Please note a Foley catheter was inserted and  the patient's bladder was drained at the beginning of the procedure.   We then palpated the lower abdomen in the suprapubic region for the  superior portion of the pubis.  Approximately one fingerbreadth cranial  to the suprapubic ramus, we made two separate incisions through the  skin, just lateral to the midline on each side.  We then proceeded by  using a needle passer, which was passed suprapubically down through the  vaginal  incision with a finger in place to guide the placement of the  needle in the correct orientation.  This was performed on the right and  the left sides.   The Foley catheter was then removed.  Flexible cystoscopy was performed.  It was evident there was perforation of the left needle passer.  This  was removed and additional passage was performed more laterally.  Flexible cystourethroscopy was then once again performed and no  perforation of the bladder was identified.  The bladder was normal in  shape and capacity.  There was no evidence of any abnormal diverticula,  lesions, stones or foreign bodies.  Bilateral ureteral orifices were  noted in the usual anatomic position and seen to be effluxing clear  yellow urine.  The cystoscope was then removed.  Foley catheter was  placed.  The mesh was then  placed onto the passers and pulled into  place.  A right-angled hemostat was then placed between the mesh and the  urethra to ensure an adequate amount of tension.  The arms of the mesh  were pulled up into place through the abdominal wall.  They were trimmed  at the level of the skin.  Dermabond was applied to these small  incisions.   The wound was copiously irrigated with antibiotic solution.  The vaginal  incision was then closed with a running 2-0 Vicryl.  Estrogen-soaked  vaginal packing was then placed into the vagina which was noted to be  hemostatic at the termination of the procedure.   It was at that point that the procedure was terminated.  Please note  that Dr. Barron Alvine was present, available and participating in all  aspects of this patient's care.   DISPOSITION:  The patient tolerated the procedure well, was extubated  and transported to the PACU in stable condition.     ______________________________  Valentina Lucks, M.D.  Electronically Signed    KR/MEDQ  D:  05/05/2009  T:  05/05/2009  Job:  [id number]

## 2011-03-14 NOTE — Op Note (Signed)
NAMEALICIA, SEIB                  ACCOUNT NO.:  [id number]   MEDICAL RECORD NO.:  [id number]          PATIENT TYPE:  AMB   LOCATION:  [facility]                         FACILITY:  [facility]   PHYSICIAN:  Valetta Fuller, M.D.  DATE OF BIRTH:  August 07, 1956   DATE OF PROCEDURE:  05/13/2008  DATE OF DISCHARGE:                               OPERATIVE REPORT   PREOPERATIVE DIAGNOSIS:  Chronic pelvic pain, with painful bladder  syndrome.   POSTOPERATIVE DIAGNOSIS:  Chronic pelvic pain, with painful bladder  syndrome.   PROCEDURE PERFORMED:  Cystoscopy, with hydraulic overdistention of  bladder and instillation of Clorpactin and Marcaine.   SURGEON:  Valetta Fuller, M.D.   ANESTHESIA:  General.   INDICATIONS:  Ms. Kopper is a 55 year old female.  She has had a pretty  longstanding history of pelvic pain and in the past undergone cystoscopy  with hydraulic overdistention of the bladder.  Endoscopically, she had  evidence of interstitial cystitis at that time and did get improvement  with that procedure.  She subsequently has had suburethral sling  performed, and that was uncomplicated and generally has worked well for  her.  She came in recently, complaining of increased bladder pain with  dyspareunia and increased bladder overactivity.  No evidence of  infection.  We talked about several options, and she opted for repeat  hydraulic overdistention of the bladder.  We thought that was a  reasonable approach.   TECHNIQUE AND FINDINGS:  The patient was brought to the operating room,  where she had successful induction of general anesthesia.  She was  placed in the lithotomy position and prepped and draped in the usual  manner.  Initial cystoscopy revealed unremarkable bladder, with no other  pathology.  She underwent hydraulic over distention of her bladder for 5  minutes at 80-100 cmH2O pressure.  Capacity was measured at 600 mL.  The  patient had 3-4+ diffuse glomerular hemorrhaging, with grossly  bloody  effluent at the end of distention.  She had a repeat hydraulic  overdistention for an additional 2-3 minutes, and her bladder was then  decompressed.  Clorpactin was instilled in her bladder, with a standard  concentration, a few hundred mL at a time under gravity drainage.  That  was then drained, and Marcaine was instilled in her bladder.  She  appeared to tolerate the procedure well was taken to the recovery room  in stable condition.           ______________________________  Valetta Fuller, M.D.  Electronically Signed     DSG/MEDQ  D:  05/13/2008  T:  05/13/2008  Job:  [id number]

## 2011-03-17 NOTE — Consult Note (Signed)
[place]  Patient:    [name], [name]                           MRN: [id number] Proc. Date: 06/02/00 Adm. Date:  [id number] Attending:  Lanell Persons CC:         Darci Needle, M.D.             Miguel Aschoff, M.D.             Chales Salmon. Abigail Miyamoto, M.D.                          Consultation Report  REFERRING PHYSICIAN:  Miguel Aschoff, M.D.  REASON FOR CONSULTATION:  Chest pain.  HISTORY OF PRESENT ILLNESS:  Lynnita Somma is a 55 year old female who presented to the emergency room on June 01, 2000, with complaints of retrosternal chest pressure, which initially started on August 2.  The patient took one sublingual nitroglycerin, went to bed and the pain subsequently resolved.  On August 3 she again noted retrosternal chest pressure scored as a 7/10 and presented to the emergency room and the pain was then relieved with two sublingual nitroglycerins.  She had associated shortness of breath but no nausea or vomiting.  Upon arrival to the emergency room the pain resolved to a 2/10.  Past medical history is significant for ER presentation in May of 2000 for chest pain.  She was discharged from the emergency room and a stress Cardiolite was performed as an outpatient.  There was no evidence of inducible ischemia by report.  The patient has been having recent problems with tachyarrhythmias and presyncope.  An Event monitor was placed.  She was noted to have a heart rate of over 200 beats per minute per the patient.  The Event monitor strips are not currently available.  She has a scheduled appointment with Dr. Katrinka Blazing on August 17.  There is no radiation of the pain.  There is no aggravating or precipitating factors noted.  The pain appears to be relieved somewhat by a sublingual nitroglycerin.  CARDIAC RISKS FACTORS:  No identifiable cardiac risk factors.  PAST MEDICAL HISTORY:  Significant for chronic fatigue syndrome, depression, thyroiditis, allergic  rhinitis, vasculitis, insomnia.  Mitral valve prolapse by patients report.  Anemia.  Urticaria vasculitis.  PAST SURGICAL HISTORY:  Significant for tonsillectomy, exploratory laparotomy for pelvic pain.  SOCIAL HISTORY:  She is married, has no occupation.  No tobacco use.  Rare alcohol use.  FAMILY HISTORY:  Significant for arthritis, thyroiditis, asthma, ulcerative colitis.  ALLERGIES:  CIPRO which results in vomiting and ______ results in a rash. ASPIRIN and NSAIDs result in antephialtics.  REVIEW OF SYSTEMS:  Presyncope as noted above.  Insomnia.  Increasing fluttering sensation.  Hospital course was reviewed.  The patient has been admitted for observation. Serial cardiac enzymes have been negative.  Her first initial CK was 167, troponin I was less than 0.03.  Second set of CKs was 114 with a CK-MB of 0.7. Troponin I was less than 0.03.  Hematocrit was noted to be 32.3, white count was 7.7, platelet count was 427.  INR was within normal limits.  Chest x-ray revealed no acute disease.  Electrolytes are unremarkable.  Several ECGs have revealed normal sinus rhythm.  No evidence of ischemia. There are frequent PACs noted on both the ECGs and telemetry strips.  PHYSICAL EXAMINATION:  VITAL SIGNS:  Her serial blood pressure has ranged from 120/130 over a diastolic of 80.   She has been afebrile.  Her heart rate has ranged from 84 to 106 beats per minute.  O2 saturation have been 98%.  The patient was reading a book upon my initial examination.  HEENT:  Unremarkable.  There is no neck vein distension noted.  The carotids reveal no bruit.  There is no thyromegaly.  PULMONARY:  Examination reveals breath sounds which are equal and clear to auscultation.  CARDIOVASCULAR:  Examination reveals a regular rate and rhythm.  There is no murmur noted on examination.  PMI is within normal limits.  ABDOMEN:  Soft and benign.  No bruits are noted.  EXTREMITIES:  Did not reveal any  peripheral edema.  NEUROLOGICAL:  Nonfocal.  SKIN:  Warm and dry.  IMPRESSION: 1. Prolonged episode of chest pain in a patient with no identifiable risk    factors, normal ECG and normal stress Cardiolite in May of 2000.  The    stress Cardiolite will be reviewed but at this point no further ischemic    cardiac work-up is indicated. 2. Palpitations and history of thyroiditis and questionable mitral valve    prolapse.  By examination there is no evidence of mitral valve prolapse.    Would initiate Lopressor 50 mg p.o. b.i.d.  This may off symptomatic    relief.  The patient should keep her scheduled followup with Dr. Katrinka Blazing    for further evaluation of her Event monitors strips. 3. Chest pain.  This may be gastrointestinal esophageal spasm in nature.    Esophageal spasm will respond to nitroglycerin as well.  Would initiate    Protonix 40 mg p.o. q.d. and GI cocktail.  Thank you for this consultation.  Will discuss the patient further with you. DD:  06/02/00 TD:  06/04/00 Job: [id number]

## 2011-03-17 NOTE — Procedures (Signed)
[facility]  Patient:    [name], [name]                           MRN: [id number] Proc. Date: 08/16/00 Attending:  Verlin Grills, M.D.                           Procedure Report  REFERRING PHYSICIAN:  Chales Salmon. Abigail Miyamoto, M.D.  PROCEDURE:  Esophagogastroduodenoscopy, small bowel biopsy, colonoscopy, and colonic biopsy.  PROCEDURE INDICATION:  Ms. [name] is a 55 year old female with unexplained iron deficiency anemia and alternating diarrhea with constipated bowel movements.  Her father has inflammatory bowel disease.  She denies a personal or family history of colorectal cancer.  [name] also has chronic gastroesophageal reflux and has been on antireflux therapy for years.  I discussed with [name] the complications associated with esophagogastroduodenoscopy, colonoscopy, biopsy, and polypectomy, including intestinal bleeding and intestinal perforation.  Ms. Macmullen has signed the operative permit.  MEDICATION ALLERGIES:  CIPRO, MINOCIN, and ASPIRIN.  CHRONIC MEDICATIONS:  Protonix, Wellbutrin SR, Synthroid, Claritin, Colchicine, Dapsone, Nu-Iron, Ambien, Lopressor, Reglan, amitriptyline, Oxycontin, vitamin C, coenzyme Q, vitamin A, calcium, vitamin B12 sublingually.  PAST MEDICAL HISTORY:  Chronic fatigue syndrome, leukoplastic vasculitis, Hashimotos thyroiditis, paroxysmal atrial tachycardia, gastroesophageal reflux disease, laparoscopy in 1987.  ENDOSCOPIST:  Verlin Grills, M.D.  PREMEDICATION:  Versed 12 mg, Demerol 100 mg.  ENDOSCOPE:  Olympus pediatric colonoscope.  PROCEDURE:  Esophagogastroduodenoscopy with small bowel biopsies using the pediatric colonoscope.  DESCRIPTION OF PROCEDURE:  After obtaining informed consent, the patient was placed in the left lateral decubitus position.  I administered intravenous Demerol and intravenous Versed to achieve conscious sedation for the procedure.  The patients blood pressure,  oxygen saturation, and cardiac rhythm were monitored throughout the procedure and documented in the medical record.  The Olympus pediatric colonoscope was passed through the posterior hypopharynx into the proximal esophagus without difficulty.  The hypopharynx, larynx, and vocal cords appeared normal.  Esophagoscopy:  The proximal, mid-, and lower segments of the esophagus appeared completely normal.  The squamocolumnar junction and the esophagogastric junction are noted at 41 cm from the incisor teeth. Endoscopically there is no evidence for the presence of Barretts esophagitis, esophageal mucosal scarring, erosive esophagitis, or esophageal ulcers.  Gastroscopy:  Endoscopically there is no evidence of a hiatal hernia. Retroflexed view of the gastric cardia and fundus was normal.  The diaphragmatic hiatus was minimally patulous.  Endoscopic appearance of the gastric body, antrum, and pylorus appeared completely normal.  Duodenoscopy:  The duodenal bulb, mid-duodenum, and distal duodenum appeared normal using the pediatric colonoscope.  Five biopsies were taken from the second and third portions of the duodenum to rule out celiac sprue.  ASSESSMENT:  Normal esophagogastroduodenoscopy.  Small bowel biopsies to rule out celiac sprue pending.  PROCEDURE:  Proctocolonoscopy to the cecum with colonic biopsies.  DESCRIPTION OF PROCEDURE:  Anal inspection revealed small but soft external hemorrhoids.  Digital rectal exam was normal.  The Olympus pediatric video colonoscope was introduced into the rectum and under direct vision advanced to the cecum as identified by a normal-appearing ileocecal valve.  Colonic preparation for the exam today was excellent.  Rectum normal.  Sigmoid colon and descending colon normal.  Splenic flexure normal.  Transverse colon normal.  Hepatic flexure normal.  Ascending colon normal.  Cecum and ileocecal valve normal.  Three biopsies were taken  from  the cecum-ascending colon; three biopsies were taken from the descending colon.  All colonic biopsies were submitted in one pathology bottle to rule out lymphocytic-collagenous colitis.  ASSESSMENT:  Normal proctocolonoscopy to the cecum.  Colonic biopsies to rule out lymphocytic-collagenous colitis pending. DD:  08/16/00 TD:  08/16/00 Job: [id number]

## 2011-03-17 NOTE — Op Note (Signed)
[name], [name]                  ACCOUNT NO.:  [id number]   MEDICAL RECORD NO.:  [id number]          PATIENT TYPE:  AMB   LOCATION:  [facility]                         FACILITY:  [facility]   PHYSICIAN:  Valetta Fuller, M.D.  DATE OF BIRTH:  [birth year]   DATE OF PROCEDURE:  03/02/2005  DATE OF DISCHARGE:                                 OPERATIVE REPORT   PREOPERATIVE DIAGNOSES:  Chronic pelvic pain, urinary frequency, rule out  interstitial cystitis.   POSTOPERATIVE DIAGNOSES:  Chronic pelvic pain, urinary frequency, rule out  interstitial cystitis.   PROCEDURES PERFORMED:  Urethral dilation, cystoscopy, hydraulic over-  distention of bladder with instillation of Clorpactin and Marcaine.   SURGEON:  Valetta Fuller, M.D.   INDICATIONS:  [name] is a 55 year old female who has had problems with  recurrent bacterial cystitis but also long-standing symptoms of bladder  overactivity and more recently increased signs and symptoms suggesting  potential painful bladder syndrome. The patient has been in to see [place]  recently with some increased symptoms, and culture was negative. Her urines  have been relatively clear, yet she has had persistent problems with  frequency, nocturia, dyspareunia, and some chronic suprapubic and pelvic  pressure and discomfort. We discussed several options with her and did  discuss the potential diagnostic and therapeutic benefits of hydraulic over-  distention of the bladder. She wanted to proceed with that evaluation.   TECHNIQUE AND FINDINGS:  The patient was brought to the operating room where  she had successful induction of general anesthesia. She was given 1 gram of  IV Ancef. She was placed in the lithotomy position and prepped and draped in  the usual manner. Urethral dilation was performed to approximately 28-  [place]. She had slight urethral snugness but no real significant  obstruction. Initial cystoscopy revealed a completely unremarkable bladder.  She  underwent hydraulic over-distention to approximately 100 cm of water  pressure for five minutes. Capacity was measured to 700 cc. The patient did  have moderate diffuse glomerular hemorrhaging, consistent at least with some  evidence of interstitial cystitis. She underwent a repeat hydraulic over-  distention for two additional minutes. We then drained her bladder and  instilled some Clorpactin under gravity drainage at a standard  concentration. The Clorpactin was then irrigated out of her bladder, and  Marcaine was instilled. The patient appeared to tolerate the procedure well  with no obvious complications. She was brought to the recovery room in  stable condition.      DSG/MEDQ  D:  03/03/2005  T:  03/03/2005  Job:  [id number]

## 2011-03-17 NOTE — Op Note (Signed)
NAMEANAE, HAMS                  ACCOUNT NO.:  [id number]   MEDICAL RECORD NO.:  [id number]          PATIENT TYPE:  AMB   LOCATION:  [facility]                         FACILITY:  [facility]   PHYSICIAN:  Valetta Fuller, M.D.  DATE OF BIRTH:  April 21, 1956   DATE OF PROCEDURE:  01/02/2007  DATE OF DISCHARGE:                               OPERATIVE REPORT   PREOPERATIVE DIAGNOSIS:  Stress incontinence.   POSTOPERATIVE DIAGNOSIS:  Stress incontinence.   PROCEDURE PERFORMED:  Suburethral sling Virgina Jock).   SURGEON:  Valetta Fuller, M.D.   ANESTHESIA:  General.   INDICATIONS:  Ms. Dam is a 55 year old female who has been a  longstanding patient of mine.  She has had some chronic problems with  painful bladder syndrome and probable interstitial cystitis.  She has  had progressive longstanding stress incontinence.  The patient did have  video urodynamics which showed good bladder emptying.  The patient did  have some mild loss of bladder compliance.  She had documented stress  incontinence with a Valsalva leak point pressure of 60 cm of water  pressure.  She had no obvious instability.  We felt she was a reasonable  candidate for suburethral sling and the patient underwent extensive  counseling with regard to advantages and disadvantages, potential  complications.  Full informed consent has been obtained.   TECHNIQUE AND FINDINGS:  The patient was brought to the operating room.  She had received preoperative Keflex.  She had successful induction of  general anesthesia and was placed a moderate lithotomy position.  Foley  catheter was inserted and the bladder completely drained.  A weighted  vaginal speculum was utilized.  The patient did have a grade II  cystocele, but we felt that that was not particular asymptomatic and did  not feel that a formal grafting or cystocele repair was really required.  The anterior vaginal mucosa was infiltrated.  A small midline incision  was made over the mid  urethra.  Vaginal planes were then established  until we were able to palpate the retropubic space bilaterally.  Two  small poke holes were made just lateral to the midline above the pubic  symphysis.  Again, we made sure the bladder was completely decompressed  and then passed the needles with direct digital finger control  bilaterally.  Once the needles were in good position and coming out on  both sides of the urethra, the Foley catheter was removed and flexible  cystoscopy was performed.  There was no evidence of any needle injury or  perforation of the bladder and the needle position appeared to be good.  The [place] sling was then attached to the end of the needles on both sides  and the sling was brought up through the suprapubic incisions.  A right  angle clamp was placed at the mid urethra to assure loose release  tensioning of the sling.  The sleeve was then removed and redundant  sling material was cut at the level of the skin.  The sling appeared to  be nicely position at the mid urethra without undue  tension.  The area  was copiously irrigated.  The vaginal incision was then closed with a  running 2-0 Vicryl  suture.  Some vaginal packing with bacitracin was utilized.  Foley  catheter was left indwelling and was draining clear urine.  Suprapubic  incisions were closed with Dermabond.  The patient was brought to the  recovery room in stable condition.  Sponge and needle counts were  correct.           ______________________________  Valetta Fuller, M.D.  Electronically Signed     DSG/MEDQ  D:  01/02/2007  T:  01/02/2007  Job:  [id number]

## 2011-03-17 NOTE — Op Note (Signed)
[name], [name]                  ACCOUNT NO.:  [id number]   MEDICAL RECORD NO.:  [id number]          PATIENT TYPE:  AMB   LOCATION:  ENDO                         FACILITY:  [facility]   PHYSICIAN:  Danise Edge, M.D.   DATE OF BIRTH:  06-09-1956   DATE OF PROCEDURE:  08/22/2005  DATE OF DISCHARGE:                                 OPERATIVE REPORT   PROCEDURE:  Esophagogastroduodenoscopy.   ENDOSCOPIST:  Danise Edge, M.D.   REFERRING PHYSICIAN:  Chales Salmon. Abigail Miyamoto, M.D.   INDICATIONS:  [name] is a 55 year old female born July 22, 1956.  [name] has intermittent esophageal dysphagia localized to the mid  retrosternal area.  She underwent a swallowing study performed by speech  pathology.  Her oral pharyngeal swallowing function is completely normal.  Esophagogastroduodenoscopy is scheduled to assess the patency of her  esophageal lumen and to perform esophageal dilation of esophageal strictures  if present.   [name] esophagogastroduodenoscopy and colonoscopy were completely  normal.   PREMEDICATION:  Versed 7.5 mg, Demerol 50 mg.   PROCEDURE:  After obtaining informed consent, [name] was placed in the  left lateral decubitus position.  I administered intravenous Demerol and  intravenous Versed to achieve conscious sedation for the procedure.  The  patient's blood pressure, oxygen saturation and cardiac rhythm were  monitored throughout the procedure and documented in the medical record.   The Olympus gastroscope was passed through the posterior hypopharynx into  the  proximal esophagus without difficulty. The hypopharynx, larynx and  vocal cords appeared completely normal.   Esophagoscopy: The proximal mid and lower segments of the esophageal mucosa  appear completely normal.  The squamocolumnar junction and esophagogastric  junction are noted at 40 cm from the incisor teeth. There is no endoscopic  evidence for the presence of Barrett's esophagus,  erosive esophagitis,  esophageal mucosal stricture formation or esophageal obstruction.   Gastroscopy:  Retroflexed view of the gastric cardia and fundus was normal.  There are a few small (less than 2 mm) polyps in the distal gastric body  which appear completely benign; biopsies were performed to rule out  Helicobacter pylori.  The gastric antrum and pylorus appear completely  normal.   Duodenoscopy:  The duodenal bulb and second portion of duodenum appear  completely normal.   ASSESSMENT:  Normal esophagogastroduodenoscopy except for the presence of a  few small polyps in the distal gastric body which were biopsied to rule out  Helicobacter pylori.   RECOMMENDATIONS:  I suspect [name] has esophageal dysmotility accounting  for her intermittent solid food dysphagia.  The lower esophageal sphincter  was widely patent and I doubt she has achalasia.   I will discuss starting sublingual Nulev (hyoscyamine) 0.25 mg prior to  meals.           ______________________________  Danise Edge, M.D.     MJ/MEDQ  D:  08/22/2005  T:  08/22/2005  Job:  [id number]   cc:   Chales Salmon. Abigail Miyamoto, M.D.  Fax: [phone]

## 2011-03-17 NOTE — H&P (Signed)
[place]. [facility]  Patient:    [name], [name]                           MRN: [id number] Adm. Date:  [id number] Disc. Date: [id number] Attending:  Lanell Persons                         History and Physical  NO DICTATION DD:  06/02/00 TD:  06/02/00 Job: [id number]

## 2011-07-24 LAB — CBC
HCT: 33.5 — ABNORMAL LOW
HCT: 40.7
Hemoglobin: 13.8
MCV: 89.3
Platelets: 426 — ABNORMAL HIGH
RBC: 4.61
RDW: 13.9
WBC: 11.4 — ABNORMAL HIGH
WBC: 6.7

## 2011-07-24 LAB — BASIC METABOLIC PANEL
BUN: 6
CO2: 28
Chloride: 104
GFR calc non Af Amer: >60
Glucose, Bld: 109 — ABNORMAL HIGH
Potassium: 4.1
Potassium: 4.3
Sodium: 137

## 2011-07-25 LAB — CBC
HCT: 37.6
Platelets: 358
RDW: 14.9

## 2011-07-28 LAB — POCT I-STAT 4, (NA,K, GLUC, HGB,HCT)
HCT: 44
Hemoglobin: 15
Operator id: 114531

## 2011-08-24 DIAGNOSIS — R9389 Abnormal findings on diagnostic imaging of other specified body structures: Secondary | ICD-10-CM | POA: Insufficient documentation

## 2011-11-06 DIAGNOSIS — I776 Arteritis, unspecified: Secondary | ICD-10-CM | POA: Diagnosis not present

## 2011-11-06 DIAGNOSIS — Z79899 Other long term (current) drug therapy: Secondary | ICD-10-CM | POA: Diagnosis not present

## 2011-11-06 DIAGNOSIS — L282 Other prurigo: Secondary | ICD-10-CM | POA: Diagnosis not present

## 2011-11-06 DIAGNOSIS — D239 Other benign neoplasm of skin, unspecified: Secondary | ICD-10-CM | POA: Diagnosis not present

## 2011-11-14 DIAGNOSIS — I776 Arteritis, unspecified: Secondary | ICD-10-CM | POA: Diagnosis not present

## 2011-11-16 DIAGNOSIS — Z780 Asymptomatic menopausal state: Secondary | ICD-10-CM | POA: Diagnosis not present

## 2011-11-16 DIAGNOSIS — Z13820 Encounter for screening for osteoporosis: Secondary | ICD-10-CM | POA: Diagnosis not present

## 2011-11-18 DIAGNOSIS — J069 Acute upper respiratory infection, unspecified: Secondary | ICD-10-CM | POA: Diagnosis not present

## 2011-12-11 DIAGNOSIS — I471 Supraventricular tachycardia: Secondary | ICD-10-CM | POA: Diagnosis not present

## 2011-12-14 DIAGNOSIS — M542 Cervicalgia: Secondary | ICD-10-CM | POA: Diagnosis not present

## 2012-01-15 DIAGNOSIS — R1310 Dysphagia, unspecified: Secondary | ICD-10-CM | POA: Diagnosis not present

## 2012-01-15 DIAGNOSIS — K219 Gastro-esophageal reflux disease without esophagitis: Secondary | ICD-10-CM | POA: Diagnosis not present

## 2012-01-16 ENCOUNTER — Other Ambulatory Visit: Payer: Self-pay | Admitting: Family Medicine

## 2012-01-16 DIAGNOSIS — K219 Gastro-esophageal reflux disease without esophagitis: Secondary | ICD-10-CM

## 2012-01-19 DIAGNOSIS — N3010 Interstitial cystitis (chronic) without hematuria: Secondary | ICD-10-CM | POA: Diagnosis not present

## 2012-01-23 ENCOUNTER — Ambulatory Visit
Admission: RE | Admit: 2012-01-23 | Discharge: 2012-01-23 | Disposition: A | Discharge status: Home / Self Care | Payer: PRIVATE HEALTH INSURANCE | Source: Ambulatory Visit | Attending: Family Medicine | Admitting: Family Medicine

## 2012-01-23 DIAGNOSIS — K219 Gastro-esophageal reflux disease without esophagitis: Secondary | ICD-10-CM

## 2012-02-02 DIAGNOSIS — E039 Hypothyroidism, unspecified: Secondary | ICD-10-CM | POA: Diagnosis not present

## 2012-02-05 DIAGNOSIS — L719 Rosacea, unspecified: Secondary | ICD-10-CM | POA: Diagnosis not present

## 2012-02-05 DIAGNOSIS — L282 Other prurigo: Secondary | ICD-10-CM | POA: Diagnosis not present

## 2012-02-05 DIAGNOSIS — I776 Arteritis, unspecified: Secondary | ICD-10-CM | POA: Diagnosis not present

## 2012-02-05 DIAGNOSIS — Z79899 Other long term (current) drug therapy: Secondary | ICD-10-CM | POA: Diagnosis not present

## 2012-02-13 ENCOUNTER — Other Ambulatory Visit (HOSPITAL_COMMUNITY): Payer: Self-pay | Admitting: Gastroenterology

## 2012-02-13 DIAGNOSIS — R110 Nausea: Secondary | ICD-10-CM | POA: Diagnosis not present

## 2012-02-13 DIAGNOSIS — K219 Gastro-esophageal reflux disease without esophagitis: Secondary | ICD-10-CM | POA: Diagnosis not present

## 2012-02-21 ENCOUNTER — Ambulatory Visit (HOSPITAL_COMMUNITY)
Admission: RE | Admit: 2012-02-21 | Discharge: 2012-02-21 | Disposition: A | Discharge status: Home / Self Care | Payer: PRIVATE HEALTH INSURANCE | Source: Ambulatory Visit | Attending: Gastroenterology | Admitting: Gastroenterology

## 2012-02-21 DIAGNOSIS — R142 Eructation: Secondary | ICD-10-CM | POA: Insufficient documentation

## 2012-02-21 DIAGNOSIS — R110 Nausea: Secondary | ICD-10-CM

## 2012-02-21 DIAGNOSIS — K3184 Gastroparesis: Secondary | ICD-10-CM | POA: Diagnosis not present

## 2012-02-21 DIAGNOSIS — R141 Gas pain: Secondary | ICD-10-CM | POA: Diagnosis not present

## 2012-02-21 DIAGNOSIS — R143 Flatulence: Secondary | ICD-10-CM | POA: Insufficient documentation

## 2012-02-21 MED ORDER — TECHNETIUM TC 99M SULFUR COLLOID
2.0000 | Freq: Once | INTRAVENOUS | Status: AC | PRN
  Administered 2012-02-21: 2 via INTRAVENOUS

## 2012-03-06 DIAGNOSIS — J209 Acute bronchitis, unspecified: Secondary | ICD-10-CM | POA: Diagnosis not present

## 2012-03-06 DIAGNOSIS — J019 Acute sinusitis, unspecified: Secondary | ICD-10-CM | POA: Diagnosis not present

## 2012-03-19 DIAGNOSIS — M5000 Cervical disc disorder with myelopathy, unspecified cervical region: Secondary | ICD-10-CM | POA: Diagnosis not present

## 2012-03-19 DIAGNOSIS — Q761 Klippel-Feil syndrome: Secondary | ICD-10-CM | POA: Diagnosis not present

## 2012-04-05 DIAGNOSIS — R1310 Dysphagia, unspecified: Secondary | ICD-10-CM | POA: Diagnosis not present

## 2012-04-05 DIAGNOSIS — K219 Gastro-esophageal reflux disease without esophagitis: Secondary | ICD-10-CM | POA: Diagnosis not present

## 2012-04-09 DIAGNOSIS — Z79899 Other long term (current) drug therapy: Secondary | ICD-10-CM | POA: Diagnosis not present

## 2012-04-09 DIAGNOSIS — N959 Unspecified menopausal and perimenopausal disorder: Secondary | ICD-10-CM | POA: Diagnosis not present

## 2012-04-09 DIAGNOSIS — I498 Other specified cardiac arrhythmias: Secondary | ICD-10-CM | POA: Diagnosis not present

## 2012-04-09 DIAGNOSIS — I776 Arteritis, unspecified: Secondary | ICD-10-CM | POA: Diagnosis not present

## 2012-04-09 DIAGNOSIS — R5382 Chronic fatigue, unspecified: Secondary | ICD-10-CM | POA: Diagnosis not present

## 2012-04-09 DIAGNOSIS — Z808 Family history of malignant neoplasm of other organs or systems: Secondary | ICD-10-CM | POA: Diagnosis not present

## 2012-04-09 DIAGNOSIS — R918 Other nonspecific abnormal finding of lung field: Secondary | ICD-10-CM | POA: Diagnosis not present

## 2012-04-09 DIAGNOSIS — E039 Hypothyroidism, unspecified: Secondary | ICD-10-CM | POA: Diagnosis not present

## 2012-04-09 DIAGNOSIS — N3010 Interstitial cystitis (chronic) without hematuria: Secondary | ICD-10-CM | POA: Diagnosis not present

## 2012-04-09 DIAGNOSIS — C549 Malignant neoplasm of corpus uteri, unspecified: Secondary | ICD-10-CM | POA: Diagnosis not present

## 2012-04-09 DIAGNOSIS — D510 Vitamin B12 deficiency anemia due to intrinsic factor deficiency: Secondary | ICD-10-CM | POA: Diagnosis not present

## 2012-04-09 DIAGNOSIS — IMO0002 Reserved for concepts with insufficient information to code with codable children: Secondary | ICD-10-CM | POA: Diagnosis not present

## 2012-04-16 DIAGNOSIS — G4700 Insomnia, unspecified: Secondary | ICD-10-CM | POA: Diagnosis not present

## 2012-04-16 DIAGNOSIS — K219 Gastro-esophageal reflux disease without esophagitis: Secondary | ICD-10-CM | POA: Diagnosis not present

## 2012-04-16 DIAGNOSIS — R5382 Chronic fatigue, unspecified: Secondary | ICD-10-CM | POA: Diagnosis not present

## 2012-04-16 DIAGNOSIS — J45909 Unspecified asthma, uncomplicated: Secondary | ICD-10-CM | POA: Diagnosis not present

## 2012-04-16 DIAGNOSIS — E039 Hypothyroidism, unspecified: Secondary | ICD-10-CM | POA: Diagnosis not present

## 2012-04-16 DIAGNOSIS — M2550 Pain in unspecified joint: Secondary | ICD-10-CM | POA: Diagnosis not present

## 2012-04-17 DIAGNOSIS — Z79899 Other long term (current) drug therapy: Secondary | ICD-10-CM | POA: Diagnosis not present

## 2012-04-18 DIAGNOSIS — M25579 Pain in unspecified ankle and joints of unspecified foot: Secondary | ICD-10-CM | POA: Diagnosis not present

## 2012-05-04 DIAGNOSIS — M545 Low back pain: Secondary | ICD-10-CM | POA: Diagnosis not present

## 2012-05-04 DIAGNOSIS — M62838 Other muscle spasm: Secondary | ICD-10-CM | POA: Diagnosis not present

## 2012-05-12 DIAGNOSIS — J019 Acute sinusitis, unspecified: Secondary | ICD-10-CM | POA: Diagnosis not present

## 2012-05-20 DIAGNOSIS — C549 Malignant neoplasm of corpus uteri, unspecified: Secondary | ICD-10-CM | POA: Diagnosis not present

## 2012-05-22 DIAGNOSIS — D510 Vitamin B12 deficiency anemia due to intrinsic factor deficiency: Secondary | ICD-10-CM | POA: Diagnosis not present

## 2012-05-22 DIAGNOSIS — E039 Hypothyroidism, unspecified: Secondary | ICD-10-CM | POA: Diagnosis not present

## 2012-05-22 DIAGNOSIS — G609 Hereditary and idiopathic neuropathy, unspecified: Secondary | ICD-10-CM | POA: Diagnosis not present

## 2012-06-03 DIAGNOSIS — L282 Other prurigo: Secondary | ICD-10-CM | POA: Diagnosis not present

## 2012-06-03 DIAGNOSIS — M25579 Pain in unspecified ankle and joints of unspecified foot: Secondary | ICD-10-CM | POA: Diagnosis not present

## 2012-06-03 DIAGNOSIS — I776 Arteritis, unspecified: Secondary | ICD-10-CM | POA: Diagnosis not present

## 2012-06-05 DIAGNOSIS — K219 Gastro-esophageal reflux disease without esophagitis: Secondary | ICD-10-CM | POA: Diagnosis not present

## 2012-06-05 DIAGNOSIS — R1310 Dysphagia, unspecified: Secondary | ICD-10-CM | POA: Diagnosis not present

## 2012-07-10 DIAGNOSIS — R1310 Dysphagia, unspecified: Secondary | ICD-10-CM | POA: Diagnosis not present

## 2012-07-10 DIAGNOSIS — IMO0001 Reserved for inherently not codable concepts without codable children: Secondary | ICD-10-CM | POA: Diagnosis not present

## 2012-07-10 DIAGNOSIS — R5382 Chronic fatigue, unspecified: Secondary | ICD-10-CM | POA: Diagnosis not present

## 2012-07-10 DIAGNOSIS — I471 Supraventricular tachycardia: Secondary | ICD-10-CM | POA: Diagnosis not present

## 2012-07-10 DIAGNOSIS — E039 Hypothyroidism, unspecified: Secondary | ICD-10-CM | POA: Diagnosis not present

## 2012-07-10 DIAGNOSIS — R1312 Dysphagia, oropharyngeal phase: Secondary | ICD-10-CM | POA: Diagnosis not present

## 2012-07-10 DIAGNOSIS — K219 Gastro-esophageal reflux disease without esophagitis: Secondary | ICD-10-CM | POA: Diagnosis not present

## 2012-07-17 DIAGNOSIS — R5382 Chronic fatigue, unspecified: Secondary | ICD-10-CM | POA: Diagnosis not present

## 2012-07-17 DIAGNOSIS — Z23 Encounter for immunization: Secondary | ICD-10-CM | POA: Diagnosis not present

## 2012-07-17 DIAGNOSIS — F411 Generalized anxiety disorder: Secondary | ICD-10-CM | POA: Diagnosis not present

## 2012-07-17 DIAGNOSIS — J45909 Unspecified asthma, uncomplicated: Secondary | ICD-10-CM | POA: Diagnosis not present

## 2012-07-17 DIAGNOSIS — R1310 Dysphagia, unspecified: Secondary | ICD-10-CM | POA: Diagnosis not present

## 2012-07-23 DIAGNOSIS — H2510 Age-related nuclear cataract, unspecified eye: Secondary | ICD-10-CM | POA: Diagnosis not present

## 2012-07-23 DIAGNOSIS — H2513 Age-related nuclear cataract, bilateral: Secondary | ICD-10-CM | POA: Insufficient documentation

## 2012-07-23 DIAGNOSIS — H43819 Vitreous degeneration, unspecified eye: Secondary | ICD-10-CM | POA: Diagnosis not present

## 2012-07-24 ENCOUNTER — Other Ambulatory Visit: Payer: Self-pay | Admitting: Family Medicine

## 2012-07-24 DIAGNOSIS — R911 Solitary pulmonary nodule: Secondary | ICD-10-CM

## 2012-07-26 ENCOUNTER — Ambulatory Visit
Admission: RE | Admit: 2012-07-26 | Discharge: 2012-07-26 | Disposition: A | Discharge status: Home / Self Care | Payer: PRIVATE HEALTH INSURANCE | Source: Ambulatory Visit | Attending: Family Medicine | Admitting: Family Medicine

## 2012-07-26 DIAGNOSIS — R911 Solitary pulmonary nodule: Secondary | ICD-10-CM

## 2012-07-26 DIAGNOSIS — R918 Other nonspecific abnormal finding of lung field: Secondary | ICD-10-CM | POA: Diagnosis not present

## 2012-07-31 DIAGNOSIS — R1310 Dysphagia, unspecified: Secondary | ICD-10-CM | POA: Diagnosis not present

## 2012-07-31 DIAGNOSIS — K229 Disease of esophagus, unspecified: Secondary | ICD-10-CM | POA: Diagnosis not present

## 2012-08-08 DIAGNOSIS — R351 Nocturia: Secondary | ICD-10-CM | POA: Diagnosis not present

## 2012-08-09 DIAGNOSIS — E039 Hypothyroidism, unspecified: Secondary | ICD-10-CM | POA: Diagnosis not present

## 2012-08-21 DIAGNOSIS — Z79899 Other long term (current) drug therapy: Secondary | ICD-10-CM | POA: Diagnosis not present

## 2012-08-22 DIAGNOSIS — H43319 Vitreous membranes and strands, unspecified eye: Secondary | ICD-10-CM | POA: Diagnosis not present

## 2012-09-06 DIAGNOSIS — R1310 Dysphagia, unspecified: Secondary | ICD-10-CM | POA: Diagnosis not present

## 2012-09-06 DIAGNOSIS — K219 Gastro-esophageal reflux disease without esophagitis: Secondary | ICD-10-CM | POA: Diagnosis not present

## 2012-09-09 DIAGNOSIS — I776 Arteritis, unspecified: Secondary | ICD-10-CM | POA: Diagnosis not present

## 2012-09-09 DIAGNOSIS — L282 Other prurigo: Secondary | ICD-10-CM | POA: Diagnosis not present

## 2012-09-09 DIAGNOSIS — Z79899 Other long term (current) drug therapy: Secondary | ICD-10-CM | POA: Diagnosis not present

## 2012-09-12 DIAGNOSIS — R351 Nocturia: Secondary | ICD-10-CM | POA: Diagnosis not present

## 2012-09-12 DIAGNOSIS — R350 Frequency of micturition: Secondary | ICD-10-CM | POA: Diagnosis not present

## 2012-10-08 DIAGNOSIS — Z124 Encounter for screening for malignant neoplasm of cervix: Secondary | ICD-10-CM | POA: Diagnosis not present

## 2012-10-08 DIAGNOSIS — F526 Dyspareunia not due to a substance or known physiological condition: Secondary | ICD-10-CM | POA: Diagnosis not present

## 2012-10-08 DIAGNOSIS — Z09 Encounter for follow-up examination after completed treatment for conditions other than malignant neoplasm: Secondary | ICD-10-CM | POA: Diagnosis not present

## 2012-10-08 DIAGNOSIS — Z8542 Personal history of malignant neoplasm of other parts of uterus: Secondary | ICD-10-CM | POA: Diagnosis not present

## 2012-10-08 DIAGNOSIS — N951 Menopausal and female climacteric states: Secondary | ICD-10-CM | POA: Diagnosis not present

## 2012-10-08 DIAGNOSIS — Z90710 Acquired absence of both cervix and uterus: Secondary | ICD-10-CM | POA: Diagnosis not present

## 2012-10-08 DIAGNOSIS — R918 Other nonspecific abnormal finding of lung field: Secondary | ICD-10-CM | POA: Diagnosis not present

## 2012-10-10 DIAGNOSIS — J3089 Other allergic rhinitis: Secondary | ICD-10-CM | POA: Diagnosis not present

## 2012-10-10 DIAGNOSIS — J45909 Unspecified asthma, uncomplicated: Secondary | ICD-10-CM | POA: Diagnosis not present

## 2012-10-10 DIAGNOSIS — J301 Allergic rhinitis due to pollen: Secondary | ICD-10-CM | POA: Diagnosis not present

## 2012-10-10 DIAGNOSIS — T781XXA Other adverse food reactions, not elsewhere classified, initial encounter: Secondary | ICD-10-CM | POA: Diagnosis not present

## 2012-10-15 DIAGNOSIS — E039 Hypothyroidism, unspecified: Secondary | ICD-10-CM | POA: Diagnosis not present

## 2012-10-16 DIAGNOSIS — F411 Generalized anxiety disorder: Secondary | ICD-10-CM | POA: Diagnosis not present

## 2012-10-16 DIAGNOSIS — M79609 Pain in unspecified limb: Secondary | ICD-10-CM | POA: Diagnosis not present

## 2012-10-16 DIAGNOSIS — R5382 Chronic fatigue, unspecified: Secondary | ICD-10-CM | POA: Diagnosis not present

## 2012-10-16 DIAGNOSIS — J45909 Unspecified asthma, uncomplicated: Secondary | ICD-10-CM | POA: Diagnosis not present

## 2012-10-16 DIAGNOSIS — G4700 Insomnia, unspecified: Secondary | ICD-10-CM | POA: Diagnosis not present

## 2012-10-16 DIAGNOSIS — E559 Vitamin D deficiency, unspecified: Secondary | ICD-10-CM | POA: Diagnosis not present

## 2012-10-16 DIAGNOSIS — Z0000 Encounter for general adult medical examination without abnormal findings: Secondary | ICD-10-CM | POA: Diagnosis not present

## 2012-10-16 DIAGNOSIS — Z13220 Encounter for screening for lipoid disorders: Secondary | ICD-10-CM | POA: Diagnosis not present

## 2012-11-13 DIAGNOSIS — L719 Rosacea, unspecified: Secondary | ICD-10-CM | POA: Diagnosis not present

## 2012-11-13 DIAGNOSIS — L820 Inflamed seborrheic keratosis: Secondary | ICD-10-CM | POA: Diagnosis not present

## 2013-02-05 DIAGNOSIS — R351 Nocturia: Secondary | ICD-10-CM | POA: Diagnosis not present

## 2013-02-10 DIAGNOSIS — L719 Rosacea, unspecified: Secondary | ICD-10-CM | POA: Diagnosis not present

## 2013-02-10 DIAGNOSIS — L282 Other prurigo: Secondary | ICD-10-CM | POA: Diagnosis not present

## 2013-02-10 DIAGNOSIS — L259 Unspecified contact dermatitis, unspecified cause: Secondary | ICD-10-CM | POA: Diagnosis not present

## 2013-02-10 DIAGNOSIS — I776 Arteritis, unspecified: Secondary | ICD-10-CM | POA: Diagnosis not present

## 2013-02-12 DIAGNOSIS — I471 Supraventricular tachycardia: Secondary | ICD-10-CM | POA: Diagnosis not present

## 2013-02-12 DIAGNOSIS — K219 Gastro-esophageal reflux disease without esophagitis: Secondary | ICD-10-CM | POA: Diagnosis not present

## 2013-02-18 DIAGNOSIS — R5382 Chronic fatigue, unspecified: Secondary | ICD-10-CM | POA: Diagnosis not present

## 2013-02-18 DIAGNOSIS — G4700 Insomnia, unspecified: Secondary | ICD-10-CM | POA: Diagnosis not present

## 2013-02-18 DIAGNOSIS — F329 Major depressive disorder, single episode, unspecified: Secondary | ICD-10-CM | POA: Diagnosis not present

## 2013-02-18 DIAGNOSIS — J45909 Unspecified asthma, uncomplicated: Secondary | ICD-10-CM | POA: Diagnosis not present

## 2013-05-21 DIAGNOSIS — L821 Other seborrheic keratosis: Secondary | ICD-10-CM | POA: Diagnosis not present

## 2013-05-21 DIAGNOSIS — Z79899 Other long term (current) drug therapy: Secondary | ICD-10-CM | POA: Diagnosis not present

## 2013-05-21 DIAGNOSIS — I776 Arteritis, unspecified: Secondary | ICD-10-CM | POA: Diagnosis not present

## 2013-05-21 DIAGNOSIS — M2550 Pain in unspecified joint: Secondary | ICD-10-CM | POA: Diagnosis not present

## 2013-05-21 DIAGNOSIS — D239 Other benign neoplasm of skin, unspecified: Secondary | ICD-10-CM | POA: Diagnosis not present

## 2013-05-21 DIAGNOSIS — L719 Rosacea, unspecified: Secondary | ICD-10-CM | POA: Diagnosis not present

## 2013-05-21 DIAGNOSIS — L282 Other prurigo: Secondary | ICD-10-CM | POA: Diagnosis not present

## 2013-06-05 DIAGNOSIS — C549 Malignant neoplasm of corpus uteri, unspecified: Secondary | ICD-10-CM | POA: Diagnosis not present

## 2013-07-02 ENCOUNTER — Other Ambulatory Visit: Payer: Self-pay | Admitting: Dermatology

## 2013-07-02 DIAGNOSIS — R238 Other skin changes: Secondary | ICD-10-CM | POA: Diagnosis not present

## 2013-07-02 DIAGNOSIS — L608 Other nail disorders: Secondary | ICD-10-CM | POA: Diagnosis not present

## 2013-07-02 DIAGNOSIS — IMO0002 Reserved for concepts with insufficient information to code with codable children: Secondary | ICD-10-CM | POA: Diagnosis not present

## 2013-07-02 DIAGNOSIS — L282 Other prurigo: Secondary | ICD-10-CM | POA: Diagnosis not present

## 2013-07-24 DIAGNOSIS — M2550 Pain in unspecified joint: Secondary | ICD-10-CM | POA: Diagnosis not present

## 2013-07-24 DIAGNOSIS — Z23 Encounter for immunization: Secondary | ICD-10-CM | POA: Diagnosis not present

## 2013-08-05 DIAGNOSIS — L723 Sebaceous cyst: Secondary | ICD-10-CM | POA: Diagnosis not present

## 2013-08-05 DIAGNOSIS — Z09 Encounter for follow-up examination after completed treatment for conditions other than malignant neoplasm: Secondary | ICD-10-CM | POA: Diagnosis not present

## 2013-08-05 DIAGNOSIS — L719 Rosacea, unspecified: Secondary | ICD-10-CM | POA: Diagnosis not present

## 2013-08-07 DIAGNOSIS — E039 Hypothyroidism, unspecified: Secondary | ICD-10-CM | POA: Diagnosis not present

## 2013-09-04 ENCOUNTER — Encounter: Payer: Self-pay | Admitting: Podiatrist

## 2013-09-04 ENCOUNTER — Ambulatory Visit (INDEPENDENT_AMBULATORY_CARE_PROVIDER_SITE_OTHER): Payer: Medicare Other | Admitting: Podiatrist

## 2013-09-04 ENCOUNTER — Ambulatory Visit (INDEPENDENT_AMBULATORY_CARE_PROVIDER_SITE_OTHER): Payer: Medicare Other

## 2013-09-04 VITALS — BP 132/70 | HR 84 | Resp 12 | Ht 65.0 in | Wt 171.0 lb

## 2013-09-04 DIAGNOSIS — M2010 Hallux valgus (acquired), unspecified foot: Secondary | ICD-10-CM | POA: Diagnosis not present

## 2013-09-04 DIAGNOSIS — M19079 Primary osteoarthritis, unspecified ankle and foot: Secondary | ICD-10-CM | POA: Diagnosis not present

## 2013-09-04 DIAGNOSIS — M19072 Primary osteoarthritis, left ankle and foot: Secondary | ICD-10-CM

## 2013-09-04 DIAGNOSIS — M2040 Other hammer toe(s) (acquired), unspecified foot: Secondary | ICD-10-CM | POA: Diagnosis not present

## 2013-09-04 DIAGNOSIS — R52 Pain, unspecified: Secondary | ICD-10-CM

## 2013-09-04 NOTE — Patient Instructions (Signed)
Pre-Operative Instructions  Congratulations, you have decided to take an important step to improving your quality of life.  You can be assured that the doctors of [facility] will be with you every step of the way.  1. Plan to be at the surgery center/hospital at least 1 (one) hour prior to your scheduled time unless otherwise directed by the surgical center/hospital staff.  You must have a responsible adult accompany you, remain during the surgery and drive you home.  Make sure you have directions to the surgical center/hospital and know how to get there on time. 2. For hospital based surgery you will need to obtain a history and physical form from your family physician within 1 month prior to the date of surgery- we will give you a form for you primary physician.  3. We make every effort to accommodate the date you request for surgery.  There are however, times where surgery dates or times have to be moved.  We will contact you as soon as possible if a change in schedule is required.   4. No Aspirin/Ibuprofen for one week before surgery.  If you are on aspirin, any non-steroidal anti-inflammatory medications (Mobic, Aleve, Ibuprofen) you should stop taking it 7 days prior to your surgery.  You make take Tylenol  For pain prior to surgery.  5. Medications- If you are taking daily heart and blood pressure medications, seizure, reflux, allergy, asthma, anxiety, pain or diabetes medications, make sure the surgery center/hospital is aware before the day of surgery so they may notify you which medications to take or avoid the day of surgery. 6. No food or drink after midnight the night before surgery unless directed otherwise by surgical center/hospital staff. 7. No alcoholic beverages 24 hours prior to surgery.  No smoking 24 hours prior to or 24 hours after surgery. 8. Wear loose pants or shorts- loose enough to fit over bandages, boots, and casts. 9. No slip on shoes, sneakers are best. 10. Bring  your boot with you to the surgery center/hospital.  Also bring crutches or a walker if your physician has prescribed it for you.  If you do not have this equipment, it will be provided for you after surgery. 11. If you have not been contracted by the surgery center/hospital by the day before your surgery, call to confirm the date and time of your surgery. 12. Leave-time from work may vary depending on the type of surgery you have.  Appropriate arrangements should be made prior to surgery with your employer. 13. Prescriptions will be provided immediately following surgery by your doctor.  Have these filled as soon as possible after surgery and take the medication as directed. 14. Remove nail polish on the operative foot. 15. Wash the night before surgery.  The night before surgery wash the foot and leg well with the antibacterial soap provided and water paying special attention to beneath the toenails and in between the toes.  Rinse thoroughly with water and dry well with a towel.  Perform this wash unless told not to do so by your physician.  Enclosed: 1 Ice pack (please put in freezer the night before surgery)   1 Hibiclens skin cleaner   Pre-op Instructions  If you have any questions regarding the instructions, do not hesitate to call our office.  [place]: [address] [place], [place] [address] [phone]  [place]: [address] [place], [place] [address] [phone]  [place]: [address]  [place], [place] [address] [phone]  Dr. Richard   Tuchman DPM, Dr. Norman Regal DPM Dr. Richard Sikora DPM, Dr. M. Todd Hyatt DPM, Dr. Sherill Wegener DPM 

## 2013-09-04 NOTE — Progress Notes (Signed)
Bunions right over left. Medial drifting toe 2nd b/l, swollen left 3rd toe.  MRN: [id number] Name: [name]  Sex: female Age: 57 y.o. DOB: 09/27/1956  Provider: Marlowe Aschoff P  Allergies: Aspirin; Ciprofloxacin; and Latex   Chief Complaint  Patient presents with  . Toe Pain    '' B/L 2ND TOE ARE SORE AND CHECK TOENAILS FOR FUNGUS''     HPI: Patient is 57 y.o. female who presents today for pain in the second toes bilaterally. Pain is more severe on the left foot compared to the right. she also complains of pain in the bunion prominences bilaterally as well. Swelling of the left third digit is noted as well.  No past medical history on file.     Medication List       This list is accurate as of: 09/04/13  4:59 PM.  Always use your most recent med list.               buPROPion 150 MG 12 hr tablet  Commonly known as:  ZYBAN  Take 150 mg by mouth 2 (two) times daily.     calcium-vitamin D 500-200 MG-UNIT per tablet  Commonly known as:  OSCAL WITH D  Take 1 tablet by mouth.     cephALEXin 250 MG capsule  Commonly known as:  KEFLEX  Take by mouth 4 (four) times daily.     DULoxetine 30 MG capsule  Commonly known as:  CYMBALTA  Take 30 mg by mouth daily.     estradiol 2 MG vaginal ring  Commonly known as:  ESTRING  Place 2 mg vaginally every 3 (three) months. follow package directions     estrogens (conjugated) 0.45 MG tablet  Commonly known as:  PREMARIN  Take 0.45 mg by mouth daily. Take daily for 21 days then do not take for 7 days.     ferrous gluconate 325 MG tablet  Commonly known as:  FERGON  Take 325 mg by mouth daily with breakfast.     fluconazole 100 MG tablet  Commonly known as:  DIFLUCAN  Take 100 mg by mouth daily.     fluticasone-salmeterol 115-21 MCG/ACT inhaler  Commonly known as:  ADVAIR HFA  Inhale 2 puffs into the lungs 2 (two) times daily.     levothyroxine 150 MCG tablet  Commonly known as:  SYNTHROID, LEVOTHROID  Take 150 mcg by  mouth daily before breakfast.     metoprolol succinate 50 MG 24 hr tablet  Commonly known as:  TOPROL-XL  Take 50 mg by mouth daily. Take with or immediately following a meal.     nitroGLYCERIN 0.4 MG SL tablet  Commonly known as:  NITROSTAT  Place 0.4 mg under the tongue every 5 (five) minutes as needed for chest pain.     pentosan polysulfate 100 MG capsule  Commonly known as:  ELMIRON  Take 100 mg by mouth 3 (three) times daily.     traMADol 50 MG tablet  Commonly known as:  ULTRAM  Take by mouth every 6 (six) hours as needed.        Meds ordered this encounter  Medications  . buPROPion (ZYBAN) 150 MG 12 hr tablet    Sig: Take 150 mg by mouth 2 (two) times daily.  . traMADol (ULTRAM) 50 MG tablet    Sig: Take by mouth every 6 (six) hours as needed.  . DULoxetine (CYMBALTA) 30 MG capsule    Sig: Take 30 mg by mouth daily.  Marland Kitchen  fluticasone-salmeterol (ADVAIR HFA) 115-21 MCG/ACT inhaler    Sig: Inhale 2 puffs into the lungs 2 (two) times daily.  . ferrous gluconate (FERGON) 325 MG tablet    Sig: Take 325 mg by mouth daily with breakfast.  . levothyroxine (SYNTHROID, LEVOTHROID) 150 MCG tablet    Sig: Take 150 mcg by mouth daily before breakfast.  . pentosan polysulfate (ELMIRON) 100 MG capsule    Sig: Take 100 mg by mouth 3 (three) times daily.  . metoprolol succinate (TOPROL-XL) 50 MG 24 hr tablet    Sig: Take 50 mg by mouth daily. Take with or immediately following a meal.  . nitroGLYCERIN (NITROSTAT) 0.4 MG SL tablet    Sig: Place 0.4 mg under the tongue every 5 (five) minutes as needed for chest pain.  Marland Kitchen estrogens, conjugated, (PREMARIN) 0.45 MG tablet    Sig: Take 0.45 mg by mouth daily. Take daily for 21 days then do not take for 7 days.  Marland Kitchen estradiol (ESTRING) 2 MG vaginal ring    Sig: Place 2 mg vaginally every 3 (three) months. follow package directions  . cephALEXin (KEFLEX) 250 MG capsule    Sig: Take by mouth 4 (four) times daily.  . fluconazole (DIFLUCAN) 100  MG tablet    Sig: Take 100 mg by mouth daily.  . calcium-vitamin D (OSCAL WITH D) 500-200 MG-UNIT per tablet    Sig: Take 1 tablet by mouth.    No past surgical history on file.   History  Substance Use Topics  . Smoking status: Never Smoker   . Smokeless tobacco: Not on file  . Alcohol Use: No    No family history on file.  Review of Systems  DATA OBTAINED: from patient  GENERAL: Feels well no fevers, no fatigue, no changes in appetite SKIN: No itching, no rashes, no open lesions, no wounds EYES: No eye pain,no redness, no discharge EARS: No earache,no ringing of ears, no recent change in hearing NOSE: No congestion, no drainage, no bleeding  MOUTH/THROAT: No mouth pain, No sore throat, No difficulty chewing or swallowing  RESPIRATORY: No cough, no wheezing, no SOB CARDIAC: No chest pain,no heart palpitations,no new onset lower extremity edema  GI: No abdominal pain, No Nausea, no vomiting, no diarrhea, no heartburn or no reflux  GU: No dysuria, no increased frequency or urgency MUSCULOSKELETAL: + joint and muscle pain NEUROLOGIC: Awake, alert, appropriate to situation, No change in mental status. SKIN:  + change in toenails PSYCHIATRIC: No overt anxiety or sadness.No behavior issue.  AMBULATION:  Ambulates unassisted  Filed Vitals:   09/04/13 1342  BP: 132/70  Pulse: 84  Resp: 12    Physical Exam  GENERAL APPEARANCE: Alert, conversant. Appropriately groomed. No acute distress.  VASCULAR: Pedal pulses palpable and strong bilateral.  Capillary refill time is immediate to all digits,  Proximal to distal cooling it warm to warm.  Digital hair growth is present bilateral  NEUROLOGIC: sensation is intact epicritically and protectively to 5.07 monofilament at 5/5 sites bilateral.  Light touch is intact bilateral, vibratory sensation intact bilateral, achilles tendon reflex is intact bilateral.  MUSCULOSKELETAL: acceptable muscle strength, tone and stability bilateral.   Moderate hallux abductovalgus deformity is noted bilateral with significant decrease in range of motion at the first metatarsophalangeal joint right compared to the left. Medial deviation of the second digit at the metatarsophalangeal joint is noted bilaterally. Swelling of the left third digit is also present. Pes planovalgus deformity noted.  DERMATOLOGIC: skin color, texture, and turger are  within normal limits. Mild pre-ulcerative lesion of the second digit left is noted from friction against the hallux. Mild yellowish discoloration of the left hallux where the second toe is overriding the hallux is noted. It does not appear to be fungal in nature. 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 At this time discussed surgical correction of the deformities as mentioned above. The patient is interested in surgery and we did discuss a hammertoe correction with shortening metatarsal osteotomy re\re balancing of the metatarsophalangeal joint at the second as well as a hallux abductovalgus to correction left with possible screw fixation. We did go over the consent form the patient's questions were encouraged and answered to the best of my ability. She will be scheduled for surgery on this left foot are her convenience. If she has any questions or concerns regarding the surgery she is instructed to contact me.   Delories Heinz, DPM

## 2013-09-08 DIAGNOSIS — Z1231 Encounter for screening mammogram for malignant neoplasm of breast: Secondary | ICD-10-CM | POA: Diagnosis not present

## 2013-09-23 ENCOUNTER — Encounter: Payer: 59 | Admitting: Podiatry

## 2013-10-01 ENCOUNTER — Telehealth: Payer: Self-pay | Admitting: *Deleted

## 2013-10-01 NOTE — Telephone Encounter (Signed)
Pt cancelled surgery states her husban has to have surgery 10/03/2013.  Rescheduled to 11/05/2013, and informed Dr Irving Shows and Dr Stacie Acres.

## 2013-10-08 ENCOUNTER — Telehealth: Payer: Self-pay | Admitting: Cardiology

## 2013-10-08 MED ORDER — CLOPIDOGREL BISULFATE 75 MG PO TABS: 75.0000 mg | ORAL_TABLET | Freq: Every day | ORAL | Status: DC

## 2013-10-08 NOTE — Telephone Encounter (Signed)
Chang in medication, Plavix 75 mg added once daily.

## 2013-10-10 ENCOUNTER — Telehealth: Payer: Self-pay | Admitting: *Deleted

## 2013-10-10 NOTE — Telephone Encounter (Signed)
Pt states her cardiologist recently put her on Plavix, does she need to go off it?  I instructed pt to ask her cardiologist and follow his instruction and to inform our office.  I will inform Dr Irving Shows.

## 2013-10-13 ENCOUNTER — Telehealth: Payer: Self-pay | Admitting: Cardiology

## 2013-10-13 NOTE — Telephone Encounter (Signed)
Pt called because Dr. Anne Fu order Plavix medication on the last office visit. Pt has not started taken this medication. Pt is scheduled for foot surgery 11/05/13 then another surgery in march. Pt would like to know what DR. Skains recommends for her to do.

## 2013-10-13 NOTE — Telephone Encounter (Signed)
Pt is aware of MD's recommendations. Pt will call back when she start the medication in march 2015.

## 2013-10-13 NOTE — Telephone Encounter (Signed)
Hold Plavix start until after the surgeries.

## 2013-10-13 NOTE — Telephone Encounter (Signed)
New Problem:  Pt states she is scheduled for foot surgery on  1/7 then another foot surgery in March. Pt is wanting to know how she needs to take her plavix. Pt is requesting a call back.

## 2013-10-14 DIAGNOSIS — Z90710 Acquired absence of both cervix and uterus: Secondary | ICD-10-CM | POA: Diagnosis not present

## 2013-10-14 DIAGNOSIS — C549 Malignant neoplasm of corpus uteri, unspecified: Secondary | ICD-10-CM | POA: Diagnosis not present

## 2013-10-14 DIAGNOSIS — I498 Other specified cardiac arrhythmias: Secondary | ICD-10-CM | POA: Diagnosis not present

## 2013-10-14 DIAGNOSIS — E063 Autoimmune thyroiditis: Secondary | ICD-10-CM | POA: Diagnosis not present

## 2013-10-14 DIAGNOSIS — L408 Other psoriasis: Secondary | ICD-10-CM | POA: Diagnosis not present

## 2013-10-14 DIAGNOSIS — K130 Diseases of lips: Secondary | ICD-10-CM | POA: Diagnosis not present

## 2013-10-14 DIAGNOSIS — Z79899 Other long term (current) drug therapy: Secondary | ICD-10-CM | POA: Diagnosis not present

## 2013-10-14 DIAGNOSIS — R5382 Chronic fatigue, unspecified: Secondary | ICD-10-CM | POA: Diagnosis not present

## 2013-10-14 DIAGNOSIS — I776 Arteritis, unspecified: Secondary | ICD-10-CM | POA: Diagnosis not present

## 2013-10-14 DIAGNOSIS — I789 Disease of capillaries, unspecified: Secondary | ICD-10-CM | POA: Diagnosis not present

## 2013-10-14 DIAGNOSIS — R911 Solitary pulmonary nodule: Secondary | ICD-10-CM | POA: Diagnosis not present

## 2013-10-14 DIAGNOSIS — N3010 Interstitial cystitis (chronic) without hematuria: Secondary | ICD-10-CM | POA: Diagnosis not present

## 2013-10-14 DIAGNOSIS — E039 Hypothyroidism, unspecified: Secondary | ICD-10-CM | POA: Diagnosis not present

## 2013-10-14 DIAGNOSIS — Z808 Family history of malignant neoplasm of other organs or systems: Secondary | ICD-10-CM | POA: Diagnosis not present

## 2013-10-14 DIAGNOSIS — D510 Vitamin B12 deficiency anemia due to intrinsic factor deficiency: Secondary | ICD-10-CM | POA: Diagnosis not present

## 2013-10-17 DIAGNOSIS — G4700 Insomnia, unspecified: Secondary | ICD-10-CM | POA: Diagnosis not present

## 2013-10-17 DIAGNOSIS — I471 Supraventricular tachycardia: Secondary | ICD-10-CM | POA: Diagnosis not present

## 2013-10-17 DIAGNOSIS — L988 Other specified disorders of the skin and subcutaneous tissue: Secondary | ICD-10-CM | POA: Diagnosis not present

## 2013-10-17 DIAGNOSIS — R5382 Chronic fatigue, unspecified: Secondary | ICD-10-CM | POA: Diagnosis not present

## 2013-10-17 DIAGNOSIS — K219 Gastro-esophageal reflux disease without esophagitis: Secondary | ICD-10-CM | POA: Diagnosis not present

## 2013-10-17 DIAGNOSIS — Z136 Encounter for screening for cardiovascular disorders: Secondary | ICD-10-CM | POA: Diagnosis not present

## 2013-10-17 DIAGNOSIS — E039 Hypothyroidism, unspecified: Secondary | ICD-10-CM | POA: Diagnosis not present

## 2013-10-17 DIAGNOSIS — Z0000 Encounter for general adult medical examination without abnormal findings: Secondary | ICD-10-CM | POA: Diagnosis not present

## 2013-10-17 DIAGNOSIS — J45909 Unspecified asthma, uncomplicated: Secondary | ICD-10-CM | POA: Diagnosis not present

## 2013-11-04 ENCOUNTER — Telehealth: Payer: Self-pay | Admitting: *Deleted

## 2013-11-04 NOTE — Telephone Encounter (Signed)
Pt asked if she needed crutches.  I informed pt, she would have minimal weight-bearing post-op 5 minutes/hour for the 1st week, she could use crutches, cane or walker for balance.  Pt states understanding.

## 2013-11-05 ENCOUNTER — Encounter: Payer: Self-pay | Admitting: Podiatrist

## 2013-11-05 DIAGNOSIS — M2010 Hallux valgus (acquired), unspecified foot: Secondary | ICD-10-CM

## 2013-11-05 DIAGNOSIS — K219 Gastro-esophageal reflux disease without esophagitis: Secondary | ICD-10-CM | POA: Diagnosis not present

## 2013-11-05 DIAGNOSIS — M21549 Acquired clubfoot, unspecified foot: Secondary | ICD-10-CM | POA: Diagnosis not present

## 2013-11-05 DIAGNOSIS — M2040 Other hammer toe(s) (acquired), unspecified foot: Secondary | ICD-10-CM | POA: Diagnosis not present

## 2013-11-05 DIAGNOSIS — M216X9 Other acquired deformities of unspecified foot: Secondary | ICD-10-CM | POA: Diagnosis not present

## 2013-11-05 DIAGNOSIS — M25579 Pain in unspecified ankle and joints of unspecified foot: Secondary | ICD-10-CM | POA: Diagnosis not present

## 2013-11-07 ENCOUNTER — Telehealth: Payer: Self-pay | Admitting: *Deleted

## 2013-11-07 MED ORDER — MEPERIDINE HCL 50 MG PO TABS: ORAL_TABLET | ORAL | Status: DC

## 2013-11-07 MED ORDER — PROMETHAZINE HCL 25 MG PO TABS: ORAL_TABLET | ORAL | Status: DC

## 2013-11-07 NOTE — Telephone Encounter (Addendum)
Pt states she is having severe itching with the Oxycodone, and Tylenol isn't helping to pain.  Pt states no other problems other tan the pain.  Dr Valentina Lucks ordered Demerol 50mg  #20 one tablet every 4 - 6 hours prn pain, and Phenergan 25mg  #20 one every 4 - 6 hours prn nausea.  I informed pt, the rx would need to be picked up at the [facility] office .

## 2013-11-11 NOTE — Progress Notes (Signed)
1) [place] bunionectomy left foot 2) Metatarsal osteotomy 2nd met left foot 3) Hammer toe repair 2nd toe left foot

## 2013-11-12 ENCOUNTER — Ambulatory Visit (INDEPENDENT_AMBULATORY_CARE_PROVIDER_SITE_OTHER): Payer: Medicare Other | Admitting: Podiatrist

## 2013-11-12 ENCOUNTER — Ambulatory Visit (INDEPENDENT_AMBULATORY_CARE_PROVIDER_SITE_OTHER): Payer: Medicare Other

## 2013-11-12 ENCOUNTER — Encounter: Payer: Self-pay | Admitting: Podiatrist

## 2013-11-12 VITALS — BP 106/64 | HR 74 | Resp 16

## 2013-11-12 DIAGNOSIS — M2010 Hallux valgus (acquired), unspecified foot: Secondary | ICD-10-CM

## 2013-11-12 DIAGNOSIS — Z9889 Other specified postprocedural states: Secondary | ICD-10-CM

## 2013-11-13 ENCOUNTER — Telehealth: Payer: Self-pay | Admitting: *Deleted

## 2013-11-13 MED ORDER — MEPERIDINE HCL 50 MG PO TABS: ORAL_TABLET | ORAL | Status: DC

## 2013-11-13 NOTE — Telephone Encounter (Addendum)
Saw Dr. Valentina Lucks yesterday for post-op appointment.  We talked about additional pain medicine.  She forgot to write it for me and I forgot to remind her.  I have 5 pills left don't want to run out over the weekend.  She had prescribed me Demerol.  I can have someone to come by to pick it up.  Please let me know.  I returned her call and told her Dr. Valentina Lucks said she did forget.  She wrote the prescription and you can come by to pick it up.

## 2013-11-13 NOTE — Telephone Encounter (Signed)
Message copied by Lolita Rieger on Thu Nov 13, 2013  4:42 PM ------      Message from: Bronson Ing      Created: Thu Nov 13, 2013  4:37 PM      Regarding: pain med       Demerol Rx Written-- sorry!! I did forget.  She can come by whenever ------

## 2013-11-14 NOTE — Progress Notes (Signed)
Subjective: Patient presents today1 week status post foot surgery of the left foot.  Date of surgery 11/05/2013. Patient denies nausea, vomiting, fevers, chills or night sweats.  Denies calf pain or tenderness to the operative side. Relates her foot is uncomfortable however the Demerol pain medication is helping with her discomfort.  Objective:  Neurovascular status is intact with palpable pedal pulses DP and PT bilateral at 2+ out of 4. Neurological sensation is intact and unchanged as per prior to surgery. Excellent appearance of the postoperative foot is noted. Moderate swelling is present which is normal one week postoperative. No redness, no streaking no signs of infection are present. Excellent alignment and position of the operative foot is seen status post left foot surgery  Assessment: Status post [facility] bunion correction, hammertoe correction second toe, shortening second metatarsal osteotomy all on the left foot. Date of surgery 11/05/2013   Plan: Sterile dressing was removed and excellent appearance of the foot was seen. X-rays reveal good alignment and position of the left foot status post foot surgery. A new dry sterile compressive dressing was applied and the patient was instructed on continued use of her air fracture walker. I will see her back in one week and she will have her sutures removed he'll be able to get the foot wet at that time. The patient is taking Demerol and is doing well with his pain medication. Will write her a prescription refill. I will see her back in one week

## 2013-11-19 ENCOUNTER — Encounter: Payer: Self-pay | Admitting: Podiatrist

## 2013-11-19 ENCOUNTER — Ambulatory Visit (INDEPENDENT_AMBULATORY_CARE_PROVIDER_SITE_OTHER): Payer: Medicare Other | Admitting: Podiatrist

## 2013-11-19 VITALS — BP 88/51 | HR 71

## 2013-11-19 DIAGNOSIS — M216X2 Other acquired deformities of left foot: Secondary | ICD-10-CM

## 2013-11-19 DIAGNOSIS — M2010 Hallux valgus (acquired), unspecified foot: Secondary | ICD-10-CM

## 2013-11-19 DIAGNOSIS — M216X9 Other acquired deformities of unspecified foot: Secondary | ICD-10-CM

## 2013-11-19 DIAGNOSIS — M2040 Other hammer toe(s) (acquired), unspecified foot: Secondary | ICD-10-CM

## 2013-11-19 DIAGNOSIS — Z9889 Other specified postprocedural states: Secondary | ICD-10-CM

## 2013-11-19 NOTE — Progress Notes (Signed)
   Subjective: Patient presents today 2 week status post foot surgery of the left foot. Date of surgery 11/05/2013. Patient denies nausea, vomiting, fevers, chills or night sweats. Denies calf pain or tenderness to the operative side. States it does get tender and hurts on top of the left foot in the second toe is a little funny.  Objective: Neurovascular status is intact with palpable pedal pulses DP and PT bilateral at 2+ out of 4. Neurological sensation is intact and unchanged as per prior to surgery. Excellent appearance of the postoperative foot is noted. Moderate swelling is present which is normal postoperative. No redness, no streaking no signs of infection are present. Excellent alignment and position of the operative foot is seen status post left foot surgery incision sites are well coapted in sutures are removed today without complication. The left second toe is a little  hypermobile but is healing nicely.  Assessment: Status post [facility] bunion correction, hammertoe correction second toe, shortening second metatarsal osteotomy all on the left foot. Date of surgery 11/05/2013   Plan: Sutures are removed and A new dry sterile compressive dressing was applied and the patient was instructed on continued use of her air fracture walker. I did dispense in [place]co shoe and gave her instructions on weaning into it. He doing very well with this left foot and is considering right foot surgery in the future. I'll see her back in 2 weeks and we can discuss how the left foot is doing decide when she is ready to the right.

## 2013-12-03 ENCOUNTER — Ambulatory Visit (INDEPENDENT_AMBULATORY_CARE_PROVIDER_SITE_OTHER): Payer: Medicare Other

## 2013-12-03 ENCOUNTER — Ambulatory Visit (INDEPENDENT_AMBULATORY_CARE_PROVIDER_SITE_OTHER): Payer: Medicare Other | Admitting: Podiatrist

## 2013-12-03 ENCOUNTER — Encounter: Payer: Self-pay | Admitting: Podiatrist

## 2013-12-03 VITALS — BP 100/57 | HR 77 | Resp 16

## 2013-12-03 DIAGNOSIS — Z9889 Other specified postprocedural states: Secondary | ICD-10-CM

## 2013-12-03 NOTE — Progress Notes (Signed)
    Subjective: Patient presents today 4 weeks status post foot surgery of the left foot- [place] bunion correction, hammertoe repair at dipj, shortening 2nd metatarsal osteotomy. Date of surgery 11/05/2013. Patient denies nausea, vomiting, fevers, chills or night sweats. Denies calf pain or tenderness to the operative side. States the second toe continues to be a little floppy. She also notices a purple color to the second toe when she stands for long periods of time. She's been wearing some slip on sandals and she's not quite ready for regular shoes.   Objective: Neurovascular status is intact with palpable pedal pulses DP and PT bilateral at 2+ out of 4. Neurological sensation is intact and unchanged as per prior to surgery. Excellent appearance of the postoperative foot is noted. Moderate swelling is present which is normal postoperative. No redness, no streaking no signs of infection are present. Excellent alignment and position of the operative foot is seen status post left foot surgery incision sites are well coapted in sutures are removed today without complication. The left second toe is still a little hypermobile but is healing nicely. X-rays are reviewed and show good postoperative appearance and radiographic healing.  Assessment: Status post [facility] bunion correction, hammertoe correction second toe, shortening second metatarsal osteotomy all on the left foot. Date of surgery 11/05/2013   Plan: Recommended continued range of motion exercises at the first metatarsophalangeal joint. Recommended she wean into a good supportive running shoe. I'll see her back in one month for followup she needs any pain medication she will call. She's also to massage the second toe.

## 2014-01-02 ENCOUNTER — Ambulatory Visit (INDEPENDENT_AMBULATORY_CARE_PROVIDER_SITE_OTHER): Payer: Medicare Other | Admitting: Podiatrist

## 2014-01-02 ENCOUNTER — Encounter: Payer: Self-pay | Admitting: Podiatrist

## 2014-01-02 ENCOUNTER — Ambulatory Visit (INDEPENDENT_AMBULATORY_CARE_PROVIDER_SITE_OTHER): Payer: Medicare Other

## 2014-01-02 VITALS — BP 126/73 | HR 72 | Resp 18

## 2014-01-02 DIAGNOSIS — Z9889 Other specified postprocedural states: Secondary | ICD-10-CM

## 2014-01-02 DIAGNOSIS — Z09 Encounter for follow-up examination after completed treatment for conditions other than malignant neoplasm: Secondary | ICD-10-CM

## 2014-01-02 DIAGNOSIS — M2010 Hallux valgus (acquired), unspecified foot: Secondary | ICD-10-CM

## 2014-01-02 DIAGNOSIS — M216X2 Other acquired deformities of left foot: Secondary | ICD-10-CM

## 2014-01-02 DIAGNOSIS — M216X9 Other acquired deformities of unspecified foot: Secondary | ICD-10-CM

## 2014-01-02 NOTE — Progress Notes (Signed)
   Subjective: Patient presents today 8 weeks status post foot surgery of the left foot- [place] bunion correction, hammertoe repair at dipj, shortening 2nd metatarsal osteotomy. Date of surgery 11/05/2013. She is wearing regular shoes and has noticed improvement.  She still has swelling of the second toe and has noticed stiffness in the great toe joint that is not improving with home exercise.  She is also interested in considering surgical correction of the right foot with a similar procedure to be performed.   Objective: Neurovascular status is intact with palpable pedal pulses DP and PT bilateral at 2+ out of 4. Neurological sensation is intact and unchanged as per prior to surgery. Excellent appearance of the postoperative foot is noted. Mild swelling is present which is normal postoperative. No redness, no streaking no signs of infection are present. Excellent alignment and position of the operative foot is seen status post left foot surgery.  Decrease in range of motion at the first MPJ is noted and The left second toe is still a little hypermobile but is healing nicely. X-rays are reviewed and show good postoperative appearance and radiographic healing. Swelling at the second toe itself is also present  Assessment: Status post [facility] bunion correction, hammertoe correction second toe, shortening second metatarsal osteotomy all on the left foot. Date of surgery 11/05/2013   Plan: recommended physical therapy with Barbaraann Barthel to help the rehab on the left foot.  He will assist with range of motion exercises in restoring the range of motion back to the toe.  We also discussed surgery on the right foot.  The consent form was discussed and all 3 pages were signed.  I will see her for the surgical correction and for her post op follow up.  Plan is to correct her bunion and her second toe as we did on the left.   If any ?'s or concerns arise, she is to call.

## 2014-01-02 NOTE — Progress Notes (Deleted)
° °  Subjective:    Patient ID: [name], female    DOB: 1956-01-29, 58 y.o.   MRN: [id number]  HPI I am doing good on my left foot and it was done on 11-05-13     Review of Systems     Objective:   Physical Exam        Assessment & Plan:

## 2014-01-02 NOTE — Patient Instructions (Signed)
Pre-Operative Instructions  Congratulations, you have decided to take an important step to improving your quality of life.  You can be assured that the doctors of [facility] will be with you every step of the way.  1. Plan to be at the surgery center/hospital at least 1 (one) hour prior to your scheduled time unless otherwise directed by the surgical center/hospital staff.  You must have a responsible adult accompany you, remain during the surgery and drive you home.  Make sure you have directions to the surgical center/hospital and know how to get there on time. 2. For hospital based surgery you will need to obtain a history and physical form from your family physician within 1 month prior to the date of surgery- we will give you a form for you primary physician.  3. We make every effort to accommodate the date you request for surgery.  There are however, times where surgery dates or times have to be moved.  We will contact you as soon as possible if a change in schedule is required.   4. No Aspirin/Ibuprofen for one week before surgery.  If you are on aspirin, any non-steroidal anti-inflammatory medications (Mobic, Aleve, Ibuprofen) you should stop taking it 7 days prior to your surgery.  You make take Tylenol  For pain prior to surgery.  5. Medications- If you are taking daily heart and blood pressure medications, seizure, reflux, allergy, asthma, anxiety, pain or diabetes medications, make sure the surgery center/hospital is aware before the day of surgery so they may notify you which medications to take or avoid the day of surgery. 6. No food or drink after midnight the night before surgery unless directed otherwise by surgical center/hospital staff. 7. No alcoholic beverages 24 hours prior to surgery.  No smoking 24 hours prior to or 24 hours after surgery. 8. Wear loose pants or shorts- loose enough to fit over bandages, boots, and casts. 9. No slip on shoes, sneakers are best. 10. Bring  your boot with you to the surgery center/hospital.  Also bring crutches or a walker if your physician has prescribed it for you.  If you do not have this equipment, it will be provided for you after surgery. 11. If you have not been contracted by the surgery center/hospital by the day before your surgery, call to confirm the date and time of your surgery. 12. Leave-time from work may vary depending on the type of surgery you have.  Appropriate arrangements should be made prior to surgery with your employer. 13. Prescriptions will be provided immediately following surgery by your doctor.  Have these filled as soon as possible after surgery and take the medication as directed. 14. Remove nail polish on the operative foot. 15. Wash the night before surgery.  The night before surgery wash the foot and leg well with the antibacterial soap provided and water paying special attention to beneath the toenails and in between the toes.  Rinse thoroughly with water and dry well with a towel.  Perform this wash unless told not to do so by your physician.  Enclosed: 1 Ice pack (please put in freezer the night before surgery)   1 Hibiclens skin cleaner   Pre-op Instructions  If you have any questions regarding the instructions, do not hesitate to call our office.  [place]: [address] [place], [place] [address] [phone]  [place]: [address] [place], [place] [address] [phone]  [place]: [address]  [place], [place] [address] [phone]  Dr. Richard   Tuchman DPM, Dr. Norman Regal DPM Dr. Richard Sikora DPM, Dr. M. Todd Hyatt DPM, Dr. Vonceil Upshur DPM 

## 2014-01-12 DIAGNOSIS — M25579 Pain in unspecified ankle and joints of unspecified foot: Secondary | ICD-10-CM | POA: Diagnosis not present

## 2014-01-12 DIAGNOSIS — M21619 Bunion of unspecified foot: Secondary | ICD-10-CM | POA: Diagnosis not present

## 2014-01-12 DIAGNOSIS — M25676 Stiffness of unspecified foot, not elsewhere classified: Secondary | ICD-10-CM | POA: Diagnosis not present

## 2014-01-12 DIAGNOSIS — M25673 Stiffness of unspecified ankle, not elsewhere classified: Secondary | ICD-10-CM | POA: Diagnosis not present

## 2014-01-16 DIAGNOSIS — M21619 Bunion of unspecified foot: Secondary | ICD-10-CM | POA: Diagnosis not present

## 2014-01-16 DIAGNOSIS — M25579 Pain in unspecified ankle and joints of unspecified foot: Secondary | ICD-10-CM | POA: Diagnosis not present

## 2014-01-16 DIAGNOSIS — M25673 Stiffness of unspecified ankle, not elsewhere classified: Secondary | ICD-10-CM | POA: Diagnosis not present

## 2014-01-16 DIAGNOSIS — M25676 Stiffness of unspecified foot, not elsewhere classified: Secondary | ICD-10-CM | POA: Diagnosis not present

## 2014-01-19 DIAGNOSIS — M25676 Stiffness of unspecified foot, not elsewhere classified: Secondary | ICD-10-CM | POA: Diagnosis not present

## 2014-01-19 DIAGNOSIS — M25579 Pain in unspecified ankle and joints of unspecified foot: Secondary | ICD-10-CM | POA: Diagnosis not present

## 2014-01-19 DIAGNOSIS — M21619 Bunion of unspecified foot: Secondary | ICD-10-CM | POA: Diagnosis not present

## 2014-01-19 DIAGNOSIS — M25673 Stiffness of unspecified ankle, not elsewhere classified: Secondary | ICD-10-CM | POA: Diagnosis not present

## 2014-01-22 DIAGNOSIS — M21619 Bunion of unspecified foot: Secondary | ICD-10-CM | POA: Diagnosis not present

## 2014-01-22 DIAGNOSIS — M25676 Stiffness of unspecified foot, not elsewhere classified: Secondary | ICD-10-CM | POA: Diagnosis not present

## 2014-01-22 DIAGNOSIS — M25579 Pain in unspecified ankle and joints of unspecified foot: Secondary | ICD-10-CM | POA: Diagnosis not present

## 2014-01-22 DIAGNOSIS — M25673 Stiffness of unspecified ankle, not elsewhere classified: Secondary | ICD-10-CM | POA: Diagnosis not present

## 2014-01-23 ENCOUNTER — Other Ambulatory Visit: Payer: Self-pay | Admitting: Physician Assistant

## 2014-01-23 DIAGNOSIS — L280 Lichen simplex chronicus: Secondary | ICD-10-CM | POA: Diagnosis not present

## 2014-01-23 DIAGNOSIS — L988 Other specified disorders of the skin and subcutaneous tissue: Secondary | ICD-10-CM | POA: Diagnosis not present

## 2014-01-23 DIAGNOSIS — D485 Neoplasm of uncertain behavior of skin: Secondary | ICD-10-CM | POA: Diagnosis not present

## 2014-01-26 DIAGNOSIS — M25673 Stiffness of unspecified ankle, not elsewhere classified: Secondary | ICD-10-CM | POA: Diagnosis not present

## 2014-01-26 DIAGNOSIS — M25579 Pain in unspecified ankle and joints of unspecified foot: Secondary | ICD-10-CM | POA: Diagnosis not present

## 2014-01-26 DIAGNOSIS — M21619 Bunion of unspecified foot: Secondary | ICD-10-CM | POA: Diagnosis not present

## 2014-01-26 DIAGNOSIS — M25676 Stiffness of unspecified foot, not elsewhere classified: Secondary | ICD-10-CM | POA: Diagnosis not present

## 2014-02-03 DIAGNOSIS — M25673 Stiffness of unspecified ankle, not elsewhere classified: Secondary | ICD-10-CM | POA: Diagnosis not present

## 2014-02-03 DIAGNOSIS — M25579 Pain in unspecified ankle and joints of unspecified foot: Secondary | ICD-10-CM | POA: Diagnosis not present

## 2014-02-03 DIAGNOSIS — M21619 Bunion of unspecified foot: Secondary | ICD-10-CM | POA: Diagnosis not present

## 2014-02-03 DIAGNOSIS — M25676 Stiffness of unspecified foot, not elsewhere classified: Secondary | ICD-10-CM | POA: Diagnosis not present

## 2014-02-04 ENCOUNTER — Encounter: Payer: Self-pay | Admitting: Podiatrist

## 2014-02-04 DIAGNOSIS — M21549 Acquired clubfoot, unspecified foot: Secondary | ICD-10-CM | POA: Diagnosis not present

## 2014-02-04 DIAGNOSIS — M25579 Pain in unspecified ankle and joints of unspecified foot: Secondary | ICD-10-CM | POA: Diagnosis not present

## 2014-02-04 DIAGNOSIS — M2010 Hallux valgus (acquired), unspecified foot: Secondary | ICD-10-CM

## 2014-02-04 DIAGNOSIS — J45909 Unspecified asthma, uncomplicated: Secondary | ICD-10-CM | POA: Diagnosis not present

## 2014-02-04 DIAGNOSIS — M21619 Bunion of unspecified foot: Secondary | ICD-10-CM | POA: Diagnosis not present

## 2014-02-04 DIAGNOSIS — M216X9 Other acquired deformities of unspecified foot: Secondary | ICD-10-CM | POA: Diagnosis not present

## 2014-02-04 NOTE — Progress Notes (Unsigned)
Dr Valentina Lucks performed - correction bunion of right foot by shifting bone into correct position and holding with screw                                     - shortening 2nd metatarsal head right foot Dr Valentina Lucks ordered - Demerol 50mg  #60 1 or 2 tablets every 4 to 6 hours prn pain                                 - Phenergan 25mg  #30 1 tablet every 6 hours prn nausea or vomiting.                                 - Keflex 500mg  #15 1 tablet tid.

## 2014-02-11 ENCOUNTER — Ambulatory Visit: Payer: PRIVATE HEALTH INSURANCE | Admitting: Cardiology

## 2014-02-11 ENCOUNTER — Ambulatory Visit (INDEPENDENT_AMBULATORY_CARE_PROVIDER_SITE_OTHER): Payer: Medicare Other

## 2014-02-11 ENCOUNTER — Ambulatory Visit (INDEPENDENT_AMBULATORY_CARE_PROVIDER_SITE_OTHER): Payer: Medicare Other | Admitting: Podiatrist

## 2014-02-11 ENCOUNTER — Encounter: Payer: Self-pay | Admitting: Podiatrist

## 2014-02-11 VITALS — BP 132/68 | HR 60 | Resp 16 | Ht 65.0 in | Wt 155.0 lb

## 2014-02-11 DIAGNOSIS — M2010 Hallux valgus (acquired), unspecified foot: Secondary | ICD-10-CM

## 2014-02-11 MED ORDER — MEPERIDINE HCL 50 MG PO TABS: ORAL_TABLET | ORAL | Status: DC

## 2014-02-11 NOTE — Progress Notes (Signed)
Subjective: Patient presents with her husband today1 week status post foot surgery of the right foot.  Date of surgery 02/04/14. Patient denies nausea, vomiting, fevers, chills or night sweats.  Denies calf pain or tenderness to the operative side. Relates this foot has been much more painful than the left foot was right after its surgery in December.  States she feels a heartbeat in her foot.  States she has been elevating as instructed and has had her boot off moreso this time.  Relates she has taken more pain medication with this foot.  She is tolerating the pain medication well now.    Objective:  Neurovascular status is intact with palpable pedal pulses DP and PT bilateral at 2+ out of 4. Neurological sensation is intact and unchanged as per prior to surgery. Significant swelling is present at the first and second metatarsal head an dmidfoot region.  Incision sites are well coapted and sutures are intact.   Excellent alignment of the postoperative foot is noted.  2nd toe left foot is still loose at the mpj. Patient states it catches on her great toe and is annoying.  We are utilizing physical therapy with minimal improvement.    xrays show good alignment and position of osteotomy sites of the right foot.  Swelling is present and notable on xray  Assessment: Status post  [facility] bunion correction, 2nd metatarsal osteotomy right foot  Plan:  Redressed the foot with a dry sterile and compressive dressing.  Recommended strict elevation above the heart.  Recommended icing.  New rx for demerol dispensed.  Discussed "stumpy" the left 2nd toe and we can consider a possible skin plasty if it does not stabilize in the future.  For now we will focus on the right foot healing.

## 2014-02-11 NOTE — Patient Instructions (Signed)
Continue to ice and elevate your foot-- it is doing great-- xrays look wonderful.  A little on the swollen side but that will go down.  Call if you have any ?'s!

## 2014-02-18 ENCOUNTER — Encounter: Payer: Medicare Other | Admitting: Podiatrist

## 2014-02-18 ENCOUNTER — Ambulatory Visit (INDEPENDENT_AMBULATORY_CARE_PROVIDER_SITE_OTHER): Payer: Self-pay | Admitting: Podiatrist

## 2014-02-18 ENCOUNTER — Encounter: Payer: Self-pay | Admitting: Podiatrist

## 2014-02-18 VITALS — BP 112/60 | HR 74 | Resp 12

## 2014-02-18 DIAGNOSIS — Z09 Encounter for follow-up examination after completed treatment for conditions other than malignant neoplasm: Secondary | ICD-10-CM

## 2014-02-18 DIAGNOSIS — M2010 Hallux valgus (acquired), unspecified foot: Secondary | ICD-10-CM

## 2014-02-18 NOTE — Progress Notes (Signed)
Subjective: Patient presents with her husband today 2 weeks status post foot surgery of the right foot. Date of surgery 02/04/14. Patient denies nausea, vomiting, fevers, chills or night sweats. Relates her swelling has gone down significantly over the past week.  She has been wearing her boot and elevating her foot.   Objective: Neurovascular status is intact with palpable pedal pulses DP and PT bilateral at 2+ out of 4. Neurological sensation is intact and unchanged as per prior to surgery. Improvement in swelling is present at the first and second metatarsal head and midfoot region. Incision sites are well coapted and sutures are intact. Excellent alignment of the postoperative foot is noted. 2nd toe left foot is still loose at the mpj. Patient states it catches on her great toe and is annoying. We are utilizing physical therapy with minimal improvement.  xrays show good alignment and position of osteotomy sites of the right foot. Swelling is improving Assessment: Status post [place] bunion correction, 2nd metatarsal osteotomy right foot  Plan: Redressed the foot with a dry sterile and compressive dressing. Recommended use of a darco shoe and a new shoe was dispensed.   Discussed "stumpy" the left 2nd toe and we can consider a possible skin plasty if it does not stabilize in the future. For now we will focus on the right foot healing. I will recheck again in 2-3  Weeks

## 2014-02-23 ENCOUNTER — Ambulatory Visit (INDEPENDENT_AMBULATORY_CARE_PROVIDER_SITE_OTHER): Payer: Medicare Other

## 2014-02-23 ENCOUNTER — Encounter: Payer: Self-pay | Admitting: Podiatry

## 2014-02-23 ENCOUNTER — Ambulatory Visit (INDEPENDENT_AMBULATORY_CARE_PROVIDER_SITE_OTHER): Payer: Medicare Other | Admitting: Podiatry

## 2014-02-23 VITALS — BP 127/69 | HR 80 | Temp 98.7°F | Resp 12

## 2014-02-23 DIAGNOSIS — Z9889 Other specified postprocedural states: Secondary | ICD-10-CM | POA: Diagnosis not present

## 2014-02-23 DIAGNOSIS — L02619 Cutaneous abscess of unspecified foot: Secondary | ICD-10-CM

## 2014-02-23 DIAGNOSIS — L03119 Cellulitis of unspecified part of limb: Secondary | ICD-10-CM

## 2014-02-23 MED ORDER — AMOXICILLIN-POT CLAVULANATE 875-125 MG PO TABS: 1.0000 | ORAL_TABLET | Freq: Two times a day (BID) | ORAL | Status: DC

## 2014-02-23 NOTE — Progress Notes (Signed)
Subjective:     Patient ID: [name], female   DOB: 1956-05-20, 58 y.o.   MRN: [id number]  HPI patient states she needs started to notice swelling of her right foot last night and that she had been on her foot over the weekend but not excessive. She is approximate 20 days after foot surgery of the right foot by Dr. Rolley Sims   Review of Systems     Objective:   Physical Exam Neurovascular status intact with erythema and edema surrounding the first metatarsal right were previous bunion was done. It is localized to this area and there is no current drainage and there was no proximal edema erythema or lymph node distention. Temperature was 98.8 and she does not give a history of systemic indications of infection    Assessment:     Probable localized infection right with possibility of inflammation secondary activity present    Plan:     Placed on Augmentin 875 mg and gave instructions on elevation and sterile dressing that we applied today. If any systemic signs of infection were to occur she is to go straight to the emergency room and she is to check her temperature at least 2 times per day. She is to see Dr. entertain on Thursday and earlier if any problems should occur

## 2014-02-27 ENCOUNTER — Encounter: Payer: Self-pay | Admitting: Podiatrist

## 2014-02-27 ENCOUNTER — Ambulatory Visit (INDEPENDENT_AMBULATORY_CARE_PROVIDER_SITE_OTHER): Payer: Self-pay | Admitting: Podiatrist

## 2014-02-27 VITALS — BP 121/72 | HR 96 | Resp 18

## 2014-02-27 DIAGNOSIS — Z9889 Other specified postprocedural states: Secondary | ICD-10-CM

## 2014-02-27 DIAGNOSIS — L03119 Cellulitis of unspecified part of limb: Principal | ICD-10-CM

## 2014-02-27 DIAGNOSIS — L02619 Cutaneous abscess of unspecified foot: Secondary | ICD-10-CM

## 2014-02-27 MED ORDER — AMOXICILLIN-POT CLAVULANATE 875-125 MG PO TABS: 1.0000 | ORAL_TABLET | Freq: Two times a day (BID) | ORAL | Status: DC

## 2014-02-27 NOTE — Progress Notes (Signed)
I had surgery 02/04/14 on my right foot and I saw Dr. Paulla Dolly this past Monday and was hot and discolored over the weekend and very swollen and have been on antibotics since Monday and it is a little better today  [name] presents today for a followup after developing a post op infection to the right foot.  She has been on augmentin since Monday and states the discoloration and swelling is starting to subside.  She denies any calf pain or tenderness. Denies an elevated temperature or systemic signs of infection  Objective:  No streaking or lyphangitis is present.  No dehiscence of the incisions present on the right foot. The swelling is present and redness is subsiding on the right foot.  Mild calor is present.  xrays taken and are unchanged.  Good alignment is present. No changes in soft tissue seen.  Assessment:  Post operative infection right foot Plan recommended [name] stay on the antibiotics for another 10 days and wrote her another rx to extend the one she is currently taking.  i will then see her back for her post op check and if she has any problems or concerns she will call

## 2014-03-04 ENCOUNTER — Ambulatory Visit (INDEPENDENT_AMBULATORY_CARE_PROVIDER_SITE_OTHER): Payer: Medicare Other | Admitting: Cardiology

## 2014-03-04 ENCOUNTER — Encounter: Payer: Self-pay | Admitting: *Deleted

## 2014-03-04 VITALS — BP 133/66 | HR 82 | Ht 65.0 in | Wt 160.2 lb

## 2014-03-04 DIAGNOSIS — Z823 Family history of stroke: Secondary | ICD-10-CM | POA: Diagnosis not present

## 2014-03-04 DIAGNOSIS — I471 Supraventricular tachycardia, unspecified: Secondary | ICD-10-CM

## 2014-03-04 NOTE — Patient Instructions (Signed)
Your physician wants you to follow-up in: 1 year with DR. Skains You will receive a reminder letter in the mail two months in advance. If you don't receive a letter, please call our office to schedule the follow-up appointment.  Your physician recommends that you continue on your current medications as directed. Please refer to the Current Medication list given to you today.

## 2014-03-04 NOTE — Progress Notes (Signed)
[place]. [address], [address] [place], [place]  [address] Phone: [phone] Fax:  [phone]  Date:  03/04/2014   ID:  [name], DOB May 27, 1956, MRN [id number]  PCP:  Tawanna Solo, MD   History of Present Illness: [name] is a 58 y.o. female with PAT (paroxysmal atrial tachycardia) who underwent echocardiogram and nuclear stress test on 07/11/10 both reassuring showing normal EF, no ischemia, mild diastolic dysfunction here for followup. This testing was performed secondary to burning/chest tightness and breathlessness. Possible GERD.she has had an evaluation by pulmonary medicine and was given Advair and this has helped considerably with her breathlessness. She was diagnosed with asthma.  She's also had paroxysmal atrial tachycardia for which beta blocker is being utilized and helpful.   Unfortunately her main concerns currently are her autoimmune condition, leukocytoclastic vasculitis. Her dermatologist has been prescribing her methotrexate. She is being monitored closely. This can make her skin very sensitive to the touch.  02/12/13 - this has been a stressful year for her. Her mother with Alzheimer's has been sick. She has moved. She found that she had a sister. Overall, no significant cardiac symptoms. She has gained some weight.   03/04/14 - Sister with stroke. Feeding tube. At one point she was concerned about her on primary prevention for stroke and she cannot take aspirin because of severe allergy. She has not been taking Plavix for primary prevention. I'm fine with this. She has lost a good amount of weight. Recent hammertoe surgery, bunion removal. Had a postoperative infection. Taking antibiotics.    Wt Readings from Last 3 Encounters:  03/04/14 160 lb 3.2 oz (72.666 kg)  02/11/14 155 lb (70.308 kg)  09/04/13 171 lb (77.565 kg)     Past Medical History  Diagnosis Date  . Chronic fatigue syndrome     Dr. Sabra Heck  . High triglycerides   . Low HDL (under 40)    . Back pain   . Hypothyroidism     Dr. Lewis Shock 4/14  . Esophageal spasm     GERD Dr. Sabra Heck, Dr. Wynetta Emery; egd 2006 nl; UGI 3/13 Small HH, moderate GERD, nl motility; seen at [facility] ([place]), egd? neg, sone improvement on sucralfate susp  . Paroxysmal atrial tachycardia     Dr. Marlou Porch  . Uterine carcinoma     Dr. Polly Cobia  . Pernicious anemia     Dr. Sabra Heck  . Leukocytoclastic vasculitis     Dr. Tonia Brooms  . Foot pain     Dr. Rushie Nyhan  . RLS (restless legs syndrome)     low ferritin Dr. Sabra Heck  . Cough     /SOB, PFT's nl, improved with Bronchodilation 8/11  . Reflux     ? delayed gastric emptying--GES nl 01/2012 (6% at 2hrs)    Past Surgical History  Procedure Laterality Date  . Bunionectomy    . Metatarsal osteotomy    . Hammer toe surgery    . US echocardiography      with Nuclear test, Dr. Marlou Porch, Low risk  . Lung biopsy      L Lower lung pulm nodules, largest 4.88mm, low risk, repeat CT in 1 yr now following with pulm at [facility]; 9/13 Stable tiny lung nodules, no f/u needed  . Colonoscopy      scr, 12/2008 ([facility]) nl    Current Outpatient Prescriptions  Medication Sig Dispense Refill  . amoxicillin-clavulanate (AUGMENTIN) 875-125 MG per tablet Take 1 tablet by mouth 2 (two) times daily.  20 tablet  0  . buPROPion (WELLBUTRIN SR) 150 MG 12 hr tablet Take 150 mg by mouth 2 (two) times daily.      . calcium-vitamin D (OSCAL WITH D) 500-200 MG-UNIT per tablet Take 1 tablet by mouth.      . cephALEXin (KEFLEX) 250 MG capsule Take by mouth daily.       . Clindamycin Phos-Benzoyl Perox (ACANYA EX) Apply topically as directed.      . colchicine 0.6 MG tablet Take 0.6 mg by mouth daily.      . DULoxetine (CYMBALTA) 30 MG capsule Take 90 mg by mouth daily.       Marland Kitchen estradiol (ESTRING) 2 MG vaginal ring Place 2 mg vaginally every 3 (three) months. follow package directions      . estrogens, conjugated, (PREMARIN) 0.45 MG tablet Take 0.45 mg by mouth daily. Take daily for 21 days then do  not take for 7 days.      . ferrous gluconate (FERGON) 325 MG tablet Take 325 mg by mouth daily with breakfast.      . fexofenadine (ALLEGRA) 180 MG tablet Take 180 mg by mouth daily.      . fluconazole (DIFLUCAN) 100 MG tablet Take 100 mg by mouth daily.      . fluticasone-salmeterol (ADVAIR HFA) 115-21 MCG/ACT inhaler Inhale 2 puffs into the lungs 2 (two) times daily.      Marland Kitchen levothyroxine (SYNTHROID, LEVOTHROID) 150 MCG tablet Take 150 mcg by mouth daily before breakfast.      . meperidine (DEMEROL) 50 MG tablet 1-2 tabs po every 4 to 6 hours as needed for pain  40 tablet  0  . metoprolol succinate (TOPROL-XL) 50 MG 24 hr tablet Take 50 mg by mouth daily. Take with or immediately following a meal.      . nitroGLYCERIN (NITROSTAT) 0.4 MG SL tablet Place 0.4 mg under the tongue every 5 (five) minutes as needed for chest pain.      . NON FORMULARY Clobestasol As directed      . pentosan polysulfate (ELMIRON) 100 MG capsule Take 100 mg by mouth 2 (two) times daily.       . promethazine (PHENERGAN) 25 MG tablet as needed. Take one tablet every 4 to 6 hours for nausea.      . traMADol (ULTRAM) 50 MG tablet Take by mouth every 6 (six) hours as needed.       No current facility-administered medications for this visit.    Allergies:    Allergies  Allergen Reactions  . Aspirin Anaphylaxis  . Ciprofloxacin Nausea And Vomiting  . Ambien [Zolpidem Tartrate]     Hallucinations  . Minocycline     Rash  . Shellfish Allergy     Rash  . Latex Rash    Social History:  The patient  reports that she has never smoked. She does not have any smokeless tobacco history on file. She reports that she does not drink alcohol or use illicit drugs.   No family history on file.  ROS:  Please see the history of present illness.   Denies any fevers, chills, orthopnea, PND   All other systems reviewed and negative.   PHYSICAL EXAM: VS:  BP 133/66  Pulse 82  Ht 5\' 5"  (1.651 m)  Wt 160 lb 3.2 oz (72.666 kg)   BMI 26.66 kg/m2 Well nourished, well developed, in no acute distress HEENT: normal, [place]/AT, EOMI Neck: no JVD, normal carotid upstroke, no bruit Cardiac:  normal  S1, S2; RRR; no murmur Lungs:  clear to auscultation bilaterally, no wheezing, rhonchi or rales Abd: soft, nontender, no hepatomegaly, no bruits Ext: no edema, foot in soft device Skin: warm and dry GU: deferred Neuro: no focal abnormalities noted, AAO x 3  EKG:  03/04/14-sinus rhythm, 83, LVH, left axis deviation     ASSESSMENT AND PLAN:  1. Paroxysmal atrial tachycardia-continue with metoprolol 50 mg. No changes made. 2. Family history of stroke-sister. Continue with aggressive primary prevention strategies, diet, exercise. LDL was 100 on 12/14. At one point I have given her Plavix to take at her request for primary prevention because she cannot take aspirin. She has not been taking the Plavix. I did tell her that this was rather aggressive to take for primary prevention and could cause bleeding. At this point, we will not use Plavix.  Signed, Candee Furbish, MD [facility]  03/04/2014 11:35 AM

## 2014-03-06 ENCOUNTER — Ambulatory Visit (INDEPENDENT_AMBULATORY_CARE_PROVIDER_SITE_OTHER): Payer: Medicare Other | Admitting: Podiatrist

## 2014-03-06 ENCOUNTER — Ambulatory Visit (INDEPENDENT_AMBULATORY_CARE_PROVIDER_SITE_OTHER): Payer: Medicare Other

## 2014-03-06 ENCOUNTER — Encounter: Payer: Self-pay | Admitting: Podiatrist

## 2014-03-06 VITALS — BP 109/59 | HR 75 | Resp 18

## 2014-03-06 DIAGNOSIS — Z09 Encounter for follow-up examination after completed treatment for conditions other than malignant neoplasm: Secondary | ICD-10-CM

## 2014-03-06 NOTE — Progress Notes (Signed)
Had surgery done on 02/04/14 on my right foot and I am better  Subjective: Patient presents today for followup of her right foot cellulitis. She states the foot is better in the redness has resolved. She relates she does not have any pain in the foot at this time. Overall she states she's improved.  Objective: Excellent clinical and radiographic appearance of the right foot is seen on x-ray. No redness, no swelling, no sign of infection is present on the right foot. Incision line is well coapted with no dehiscence present. A very superficial eschar is present which is about to slough off. No systemic or localized sign of infection is present right foot.  Assessment: Cellulitis status post right foot surgery  Plan: Recommended the patient stay in her anklet. She may also wean out of her Darco shoe into a good supportive athletic sneaker. She is to increase the frequency of her range of motion exercises at the first metatarsophalangeal joint right. She may also get back to walking for exercise. I will see her back for her two-month followup in one month. If any redness, swelling or signs of infection arise or any concerns arise she is instructed to call me immediately.

## 2014-03-11 ENCOUNTER — Encounter: Payer: Medicare Other | Admitting: Podiatrist

## 2014-03-11 DIAGNOSIS — E039 Hypothyroidism, unspecified: Secondary | ICD-10-CM | POA: Diagnosis not present

## 2014-03-11 DIAGNOSIS — Z8249 Family history of ischemic heart disease and other diseases of the circulatory system: Secondary | ICD-10-CM | POA: Diagnosis not present

## 2014-03-19 ENCOUNTER — Other Ambulatory Visit (HOSPITAL_COMMUNITY): Payer: Self-pay | Admitting: "Endocrinology

## 2014-03-19 DIAGNOSIS — Z823 Family history of stroke: Secondary | ICD-10-CM

## 2014-03-26 ENCOUNTER — Ambulatory Visit (HOSPITAL_COMMUNITY)
Admission: RE | Admit: 2014-03-26 | Discharge: 2014-03-26 | Disposition: A | Discharge status: Home / Self Care | Payer: Medicare Other | Source: Ambulatory Visit | Attending: Family Medicine | Admitting: Family Medicine

## 2014-03-26 DIAGNOSIS — Z823 Family history of stroke: Secondary | ICD-10-CM | POA: Insufficient documentation

## 2014-03-26 NOTE — Progress Notes (Signed)
VASCULAR LAB PRELIMINARY  PRELIMINARY  PRELIMINARY  PRELIMINARY  Carotid Dopplers completed.    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  Vertebral artery flow is antegrade.  Iantha Fallen, RVT 03/26/2014, 11:16 AM

## 2014-04-08 ENCOUNTER — Ambulatory Visit (INDEPENDENT_AMBULATORY_CARE_PROVIDER_SITE_OTHER): Payer: Medicare Other

## 2014-04-08 ENCOUNTER — Encounter: Payer: Self-pay | Admitting: Podiatrist

## 2014-04-08 ENCOUNTER — Ambulatory Visit (INDEPENDENT_AMBULATORY_CARE_PROVIDER_SITE_OTHER): Payer: Medicare Other | Admitting: Podiatrist

## 2014-04-08 VITALS — BP 105/64 | HR 78 | Resp 12

## 2014-04-08 DIAGNOSIS — Z09 Encounter for follow-up examination after completed treatment for conditions other than malignant neoplasm: Secondary | ICD-10-CM

## 2014-04-08 DIAGNOSIS — M2010 Hallux valgus (acquired), unspecified foot: Secondary | ICD-10-CM

## 2014-04-09 NOTE — Progress Notes (Signed)
Subjective: Patient presents today for followup of her right foot status post bunion correction and shortening 2nd metatarsal osteotomy.  She states she still has discomfort and she gets a discreet area of purplish discoloration on the dorsal aspect of the foot near the area of the incision sites.  Overall the swelling has improved and the redness she was experiencing has resolved.  She is also concerned at the contracture of the left 2nd digit at the mpj and the slight curvature of the distal ipj of the right 2nd toe.    Objective: Excellent appearance of the right foot is seen at this time.  No area of purplish discoloration is appreciated but she has been sitting today. No redness, no swelling, no sign of infection is present on the right foot. Incision line is well coapted with no dehiscence present.  Distal ipj of the 2nd toe right has a slight medial curvature.  This is not new since surgery it was just not addressed as it was not a painful concern.  Assessment: status post right foot surgery   Plan: recommended physical therapy for both feet to address her concerns.  She sill see [name].  I will see her back in 1 month for continued follow up.

## 2014-04-17 DIAGNOSIS — E039 Hypothyroidism, unspecified: Secondary | ICD-10-CM | POA: Diagnosis not present

## 2014-04-17 DIAGNOSIS — R5382 Chronic fatigue, unspecified: Secondary | ICD-10-CM | POA: Diagnosis not present

## 2014-04-17 DIAGNOSIS — J45909 Unspecified asthma, uncomplicated: Secondary | ICD-10-CM | POA: Diagnosis not present

## 2014-04-17 DIAGNOSIS — F329 Major depressive disorder, single episode, unspecified: Secondary | ICD-10-CM | POA: Diagnosis not present

## 2014-04-17 DIAGNOSIS — G9332 Myalgic encephalomyelitis/chronic fatigue syndrome: Secondary | ICD-10-CM | POA: Diagnosis not present

## 2014-04-20 DIAGNOSIS — N3010 Interstitial cystitis (chronic) without hematuria: Secondary | ICD-10-CM | POA: Diagnosis not present

## 2014-04-20 DIAGNOSIS — R351 Nocturia: Secondary | ICD-10-CM | POA: Diagnosis not present

## 2014-04-20 DIAGNOSIS — R3129 Other microscopic hematuria: Secondary | ICD-10-CM | POA: Diagnosis not present

## 2014-04-20 DIAGNOSIS — N393 Stress incontinence (female) (male): Secondary | ICD-10-CM | POA: Diagnosis not present

## 2014-04-21 DIAGNOSIS — M25676 Stiffness of unspecified foot, not elsewhere classified: Secondary | ICD-10-CM | POA: Diagnosis not present

## 2014-04-21 DIAGNOSIS — M25579 Pain in unspecified ankle and joints of unspecified foot: Secondary | ICD-10-CM | POA: Diagnosis not present

## 2014-04-21 DIAGNOSIS — M21619 Bunion of unspecified foot: Secondary | ICD-10-CM | POA: Diagnosis not present

## 2014-04-21 DIAGNOSIS — M25673 Stiffness of unspecified ankle, not elsewhere classified: Secondary | ICD-10-CM | POA: Diagnosis not present

## 2014-04-23 DIAGNOSIS — M21619 Bunion of unspecified foot: Secondary | ICD-10-CM | POA: Diagnosis not present

## 2014-04-23 DIAGNOSIS — M25579 Pain in unspecified ankle and joints of unspecified foot: Secondary | ICD-10-CM | POA: Diagnosis not present

## 2014-04-23 DIAGNOSIS — M25676 Stiffness of unspecified foot, not elsewhere classified: Secondary | ICD-10-CM | POA: Diagnosis not present

## 2014-04-27 DIAGNOSIS — M25676 Stiffness of unspecified foot, not elsewhere classified: Secondary | ICD-10-CM | POA: Diagnosis not present

## 2014-04-27 DIAGNOSIS — M25579 Pain in unspecified ankle and joints of unspecified foot: Secondary | ICD-10-CM | POA: Diagnosis not present

## 2014-04-27 DIAGNOSIS — M21619 Bunion of unspecified foot: Secondary | ICD-10-CM | POA: Diagnosis not present

## 2014-04-30 DIAGNOSIS — M25676 Stiffness of unspecified foot, not elsewhere classified: Secondary | ICD-10-CM | POA: Diagnosis not present

## 2014-04-30 DIAGNOSIS — M25579 Pain in unspecified ankle and joints of unspecified foot: Secondary | ICD-10-CM | POA: Diagnosis not present

## 2014-04-30 DIAGNOSIS — M21619 Bunion of unspecified foot: Secondary | ICD-10-CM | POA: Diagnosis not present

## 2014-05-06 ENCOUNTER — Ambulatory Visit (INDEPENDENT_AMBULATORY_CARE_PROVIDER_SITE_OTHER): Payer: Medicare Other | Admitting: Podiatrist

## 2014-05-06 ENCOUNTER — Encounter: Payer: Self-pay | Admitting: Podiatrist

## 2014-05-06 VITALS — BP 113/63 | HR 73 | Resp 18

## 2014-05-06 DIAGNOSIS — Z09 Encounter for follow-up examination after completed treatment for conditions other than malignant neoplasm: Secondary | ICD-10-CM

## 2014-05-06 DIAGNOSIS — M25673 Stiffness of unspecified ankle, not elsewhere classified: Secondary | ICD-10-CM | POA: Diagnosis not present

## 2014-05-06 DIAGNOSIS — M2040 Other hammer toe(s) (acquired), unspecified foot: Secondary | ICD-10-CM

## 2014-05-06 DIAGNOSIS — M25579 Pain in unspecified ankle and joints of unspecified foot: Secondary | ICD-10-CM | POA: Diagnosis not present

## 2014-05-06 DIAGNOSIS — M25676 Stiffness of unspecified foot, not elsewhere classified: Secondary | ICD-10-CM | POA: Diagnosis not present

## 2014-05-06 DIAGNOSIS — M21619 Bunion of unspecified foot: Secondary | ICD-10-CM | POA: Diagnosis not present

## 2014-05-06 MED ORDER — PREGABALIN 25 MG PO CAPS: 25.0000 mg | ORAL_CAPSULE | Freq: Every day | ORAL | Status: DC

## 2014-05-06 NOTE — Progress Notes (Signed)
Right foot is doing ok and end of day it is swollen and from the top to the end of the toes it stays purple and the left 2nd toe stays purple and is curved and the physical therapy is going good but they really move the toes a lot  [name] presents today for followup of bilateral feet status post surgery. She states that Barbaraann Barthel is doing well with physical therapy however her right foot still has swelling at the little purple. She also states that the left second toe is starting to flex and she has a bump. She also has a curvature at the tip of the right second toe as well. She also has some numbness at night.  Objective: Neurovascular status is unchanged improvement in the right foot is noted. Swelling is decreased. At the distal interphalangeal joint there is a slight lateral deviation of the toe right second, left second toe has a contracture at the proximal interphalangeal joint which has developed status post surgery  Assessment: Status post bilateral foot surgery, hammertoe contracture  Plan: Discussed tweaking the second toes with arthroplasty procedures a distal interphalangeal joint procedure will be performed on the right second toe and a proximal interphalangeal joint procedure will be performed on the left with pin fixation left only. We discussed the risks alternatives and benefits of surgery and the patient wishes to proceed she will be seen at [facility] specialty surgery center on outpatient basis. Consents were signed and questions were encouraged and answered to the best of my ability. Also started her on Neurontin for the nerve pain a very low dose was prescribed

## 2014-05-06 NOTE — Patient Instructions (Signed)
Pre-Operative Instructions  Congratulations, you have decided to take an important step to improving your quality of life.  You can be assured that the doctors of [facility] will be with you every step of the way.  1. Plan to be at the surgery center/hospital at least 1 (one) hour prior to your scheduled time unless otherwise directed by the surgical center/hospital staff.  You must have a responsible adult accompany you, remain during the surgery and drive you home.  Make sure you have directions to the surgical center/hospital and know how to get there on time. 2. For hospital based surgery you will need to obtain a history and physical form from your family physician within 1 month prior to the date of surgery- we will give you a form for you primary physician.  3. We make every effort to accommodate the date you request for surgery.  There are however, times where surgery dates or times have to be moved.  We will contact you as soon as possible if a change in schedule is required.   4. No Aspirin/Ibuprofen for one week before surgery.  If you are on aspirin, any non-steroidal anti-inflammatory medications (Mobic, Aleve, Ibuprofen) you should stop taking it 7 days prior to your surgery.  You make take Tylenol  For pain prior to surgery.  5. Medications- If you are taking daily heart and blood pressure medications, seizure, reflux, allergy, asthma, anxiety, pain or diabetes medications, make sure the surgery center/hospital is aware before the day of surgery so they may notify you which medications to take or avoid the day of surgery. 6. No food or drink after midnight the night before surgery unless directed otherwise by surgical center/hospital staff. 7. No alcoholic beverages 24 hours prior to surgery.  No smoking 24 hours prior to or 24 hours after surgery. 8. Wear loose pants or shorts- loose enough to fit over bandages, boots, and casts. 9. No slip on shoes, sneakers are best. 10. Bring  your boot with you to the surgery center/hospital.  Also bring crutches or a walker if your physician has prescribed it for you.  If you do not have this equipment, it will be provided for you after surgery. 11. If you have not been contracted by the surgery center/hospital by the day before your surgery, call to confirm the date and time of your surgery. 12. Leave-time from work may vary depending on the type of surgery you have.  Appropriate arrangements should be made prior to surgery with your employer. 13. Prescriptions will be provided immediately following surgery by your doctor.  Have these filled as soon as possible after surgery and take the medication as directed. 14. Remove nail polish on the operative foot. 15. Wash the night before surgery.  The night before surgery wash the foot and leg well with the antibacterial soap provided and water paying special attention to beneath the toenails and in between the toes.  Rinse thoroughly with water and dry well with a towel.  Perform this wash unless told not to do so by your physician.  Enclosed: 1 Ice pack (please put in freezer the night before surgery)   1 Hibiclens skin cleaner   Pre-op Instructions  If you have any questions regarding the instructions, do not hesitate to call our office.  [place]: [address] [place], [place] [address] [phone]  [place]: [address] [place], [place] [address] [phone]  [place]: [address]  [place], [place] [address] [phone]  Dr. Richard   Tuchman DPM, Dr. Norman Regal DPM Dr. Richard Sikora DPM, Dr. M. Todd Hyatt DPM, Dr. Dionne Knoop DPM 

## 2014-05-08 ENCOUNTER — Telehealth: Payer: Self-pay | Admitting: *Deleted

## 2014-05-08 NOTE — Telephone Encounter (Signed)
Pt called stating she is having surgery on 7.15.15 and she is wanting to know how long the pin will be in her toe. Told pt usually 4 weeks. Pt understood.

## 2014-05-13 DIAGNOSIS — I499 Cardiac arrhythmia, unspecified: Secondary | ICD-10-CM | POA: Diagnosis not present

## 2014-05-13 DIAGNOSIS — M2040 Other hammer toe(s) (acquired), unspecified foot: Secondary | ICD-10-CM | POA: Diagnosis not present

## 2014-05-15 ENCOUNTER — Telehealth: Payer: Self-pay | Admitting: *Deleted

## 2014-05-15 NOTE — Telephone Encounter (Signed)
Had surgery on Wednesday.  She put a pin in my left foot.  I fell out of bed last night.  I caught myself putting down my foot.  I don't think I hurt it.  I did feel it!  I want to make sure I didn't hurt anything.  Call me back.  I returned her call.  I asked her how her foot is doing.  She stated it hurts but it's only been 2 days. She stated she was lying on the bed and tried to reach down to get her dog and dog moved.  She stated she just tumbled over and put her foot down by instinct.  I asked her if she hit the pin.  She stated no she didn't hit the pin, foot landed on the side away from the pin.  She said she just wanted to make sure it was okay.  I told her it should be fine.  She stated thank you for calling.

## 2014-05-20 ENCOUNTER — Encounter: Payer: Self-pay | Admitting: Podiatrist

## 2014-05-20 ENCOUNTER — Ambulatory Visit (INDEPENDENT_AMBULATORY_CARE_PROVIDER_SITE_OTHER): Payer: Medicare Other

## 2014-05-20 ENCOUNTER — Ambulatory Visit (INDEPENDENT_AMBULATORY_CARE_PROVIDER_SITE_OTHER): Payer: Medicare Other | Admitting: Podiatrist

## 2014-05-20 VITALS — BP 120/69 | HR 89 | Resp 13 | Ht 65.0 in | Wt 157.0 lb

## 2014-05-20 DIAGNOSIS — M2040 Other hammer toe(s) (acquired), unspecified foot: Secondary | ICD-10-CM | POA: Diagnosis not present

## 2014-05-20 MED ORDER — AMOXICILLIN-POT CLAVULANATE 875-125 MG PO TABS: 1.0000 | ORAL_TABLET | Freq: Two times a day (BID) | ORAL | Status: DC

## 2014-05-22 NOTE — Progress Notes (Signed)
Subjective: Patient presents today1 week status post foot surgery of the both feet-- distal arthroplasty 2nd toe right and proximal ipj arthroplasty 2nd toe left with pin fixation.  Date of surgery 05-13-2014. Patient denies nausea, vomiting, fevers, chills or night sweats.  Denies calf pain or tenderness to the operative side. Relates the right 2nd toe is a little pink but not painful  Objective:  Neurovascular status is intact with palpable pedal pulses DP and PT bilateral at 2+ out of 4. Neurological sensation is intact and unchanged as per prior to surgery. Excellent appearance of the postoperative foot is noted. Pink discoloration to the right 2nd toe is noted.  Incision site is well coapted with sutures in place. Is in good position and placement left second toe  Assessment: Status post second toe surgery bilateral  Plan:  Redressed the incision sites with a dry sterile compressive dressing. Instructed the patient to keep the feet dry. Resuming antibiotic for the second toe on the right foot and I will see her back next week for followup and removal of sutures only. She will continue wearing her [place] shoes.

## 2014-05-27 ENCOUNTER — Ambulatory Visit (INDEPENDENT_AMBULATORY_CARE_PROVIDER_SITE_OTHER): Payer: Medicare Other | Admitting: Podiatrist

## 2014-05-27 ENCOUNTER — Encounter: Payer: Self-pay | Admitting: Podiatrist

## 2014-05-27 VITALS — BP 98/60 | HR 66 | Resp 16

## 2014-05-27 DIAGNOSIS — M2040 Other hammer toe(s) (acquired), unspecified foot: Secondary | ICD-10-CM

## 2014-05-28 NOTE — Progress Notes (Signed)
Subjective: [name] presents today for HER-2 week postoperative followup regarding surgery and second toes bilateral. She states she's doing well overall and taken antibiotics instructed.  Objective: Excellent appearance of second digits bilateral is noted. The right second toe slightly laterally deviates from the pressure of the hallux. The second toe is in rectus alignment with pin fixation in place. No redness, no swelling, no sign of infection is present.  Assessment: Status post digital arthroplasty second digit bilateral  Plan: Removed sutures at today's visit she may get the foot wet but she does need to dry it and wear protective dressing of the left second toe at the pin. I will see her back in 2 weeks for her 4 week followup where x-rays will be taken and we will decide if it's time to remove the pin. If she has any concerns prior that visit she will call

## 2014-06-10 ENCOUNTER — Other Ambulatory Visit: Payer: Self-pay | Admitting: Podiatrist

## 2014-06-10 ENCOUNTER — Telehealth: Payer: Self-pay | Admitting: *Deleted

## 2014-06-10 MED ORDER — AMOXICILLIN-POT CLAVULANATE 875-125 MG PO TABS: 1.0000 | ORAL_TABLET | Freq: Two times a day (BID) | ORAL | Status: DC

## 2014-06-10 NOTE — Telephone Encounter (Signed)
I returned her call.  I told her Dr. Valentina Lucks said to soak in Epsom salt water twice a day, apply a bandaid and triple antibiotic ointment.  She said she doesn't think it's ingrown, it's in the nail bed.  However, she said my toe is red and swollen.  It wakes me up at night.  I told her she said she would order an antibiotic.  Will send it to [facility].  She said tell her thanks so much!

## 2014-06-10 NOTE — Telephone Encounter (Signed)
Big toe on right foot, my nail is so painful.  It has awakened me the past 2 nights.  What can I do until my appointment on Friday?  It's painful.

## 2014-06-12 ENCOUNTER — Ambulatory Visit (INDEPENDENT_AMBULATORY_CARE_PROVIDER_SITE_OTHER): Payer: Medicare Other

## 2014-06-12 ENCOUNTER — Encounter: Payer: Self-pay | Admitting: Podiatrist

## 2014-06-12 ENCOUNTER — Telehealth: Payer: Self-pay | Admitting: *Deleted

## 2014-06-12 ENCOUNTER — Ambulatory Visit (INDEPENDENT_AMBULATORY_CARE_PROVIDER_SITE_OTHER): Payer: Medicare Other | Admitting: Podiatrist

## 2014-06-12 VITALS — BP 100/64 | HR 77 | Resp 16

## 2014-06-12 DIAGNOSIS — M2040 Other hammer toe(s) (acquired), unspecified foot: Secondary | ICD-10-CM | POA: Diagnosis not present

## 2014-06-12 NOTE — Progress Notes (Signed)
Subjective: [name] presents today for followup of bilateral hammertoe surgery. Her pin is going to be removed on the left foot today. She states she's been doing well and following instructions. She also states she developed pain behind the right great toe and right second toe and I put her on antibiotics and it seems to have helped. There was no sign of drainage or malodor to the toe. Objective neurovascular status is intact and unchanged. The right hallux does not appear to be inflamed, red, swollen or no malodor is noted. No pus or purulence is seen. Right second toe is purplish discoloration and a bit cooler than the remainder of the toes the distal interphalangeal joint is still slightly laterally deviated postoperatively and swollen. The left second toe is in rectus alignment good alignment and position with pin fixation in good position both clinically and radiographically. Assessment: Status post hammertoe correction bilateral Plan: Removed the pin without complication at today's visit. Also recommended she continue on the antibiotic for the right foot. Recommended a cream through [facility] pharmacy. This will be delivered to her. I will see her back in 3 weeks for followup. We are keeping a eye on the right second toe.

## 2014-06-12 NOTE — Patient Instructions (Signed)
Wear your surgical shoe on your left foot for 5 more days then you may transition into sneakers or sandals you find comfortable.  I'll have the cream company send you the compounding medication to help the nerve endings on the right foot shape up!

## 2014-06-12 NOTE — Telephone Encounter (Signed)
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  [place] Ph: [phone]

## 2014-06-15 DIAGNOSIS — N3020 Other chronic cystitis without hematuria: Secondary | ICD-10-CM | POA: Diagnosis not present

## 2014-06-15 DIAGNOSIS — R3129 Other microscopic hematuria: Secondary | ICD-10-CM | POA: Diagnosis not present

## 2014-06-17 DIAGNOSIS — R5382 Chronic fatigue, unspecified: Secondary | ICD-10-CM | POA: Diagnosis not present

## 2014-06-17 DIAGNOSIS — G9332 Myalgic encephalomyelitis/chronic fatigue syndrome: Secondary | ICD-10-CM | POA: Diagnosis not present

## 2014-06-17 DIAGNOSIS — R42 Dizziness and giddiness: Secondary | ICD-10-CM | POA: Diagnosis not present

## 2014-06-24 DIAGNOSIS — E7800 Pure hypercholesterolemia, unspecified: Secondary | ICD-10-CM | POA: Diagnosis not present

## 2014-06-24 DIAGNOSIS — M949 Disorder of cartilage, unspecified: Secondary | ICD-10-CM | POA: Diagnosis not present

## 2014-06-24 DIAGNOSIS — M899 Disorder of bone, unspecified: Secondary | ICD-10-CM | POA: Diagnosis not present

## 2014-06-24 DIAGNOSIS — E039 Hypothyroidism, unspecified: Secondary | ICD-10-CM | POA: Diagnosis not present

## 2014-06-24 DIAGNOSIS — Z8249 Family history of ischemic heart disease and other diseases of the circulatory system: Secondary | ICD-10-CM | POA: Diagnosis not present

## 2014-06-26 ENCOUNTER — Telehealth: Payer: Self-pay | Admitting: *Deleted

## 2014-06-26 NOTE — Telephone Encounter (Signed)
I called and informed her to go ahead and wrap it with a bandage and apply a little compression.  If it's not better by Monday, call and schedule an appointment.  She stated that is what I went ahead and did.  I will call on Monday if I need to, thanks for calling.

## 2014-06-26 NOTE — Telephone Encounter (Signed)
I'm calling regarding, actually she did surgery on both of my feet, my left foot in particular.  My second toe which was my Hammer Toe is very very swollen.  So much so it's putting pressure on my big toe.  So, I didn't know if she would have me wrap that in a compression kind of manner to hopefully deal with some of the swelling?  It's really really aggravating the feeling out of my big toe.  Maybe you can talk to her about that and call me back.  I'd appreciate that, thank you so much.

## 2014-07-03 ENCOUNTER — Encounter: Payer: Medicare Other | Admitting: Podiatrist

## 2014-07-10 ENCOUNTER — Ambulatory Visit (INDEPENDENT_AMBULATORY_CARE_PROVIDER_SITE_OTHER): Payer: Medicare Other | Admitting: Podiatrist

## 2014-07-10 ENCOUNTER — Encounter: Payer: Self-pay | Admitting: Podiatrist

## 2014-07-10 ENCOUNTER — Ambulatory Visit (INDEPENDENT_AMBULATORY_CARE_PROVIDER_SITE_OTHER): Payer: Medicare Other

## 2014-07-10 VITALS — BP 114/72 | HR 93 | Resp 13 | Ht 65.0 in | Wt 160.0 lb

## 2014-07-10 DIAGNOSIS — M2040 Other hammer toe(s) (acquired), unspecified foot: Secondary | ICD-10-CM

## 2014-07-10 DIAGNOSIS — Z09 Encounter for follow-up examination after completed treatment for conditions other than malignant neoplasm: Secondary | ICD-10-CM

## 2014-07-10 NOTE — Progress Notes (Signed)
   Subjective:    Patient ID: [name], female    DOB: Dec 16, 1955, 58 y.o.   MRN: [id number]  HPI Comments: Clarisse Gouge 05/13/2014 B/L 2nd toe hammer toe repair  Pt states left 2nd toe has a knot and is painful to squeeze, right 2nd toe hurts in shoes.     Review of Systems     Objective:   Physical Exam Neurovascular status intact and unchanged. Left 2nd toe still swollen at base pain with direct pressure is noted.. Rectus alignment and position is noted at the proximal interphalangeal joint. Right 2nd toe still slightly laterally deviated at the distal interphalangeal joint. No pain is reported.    Assessment & Plan:  Healing arthroplasty second toe bilateral  Plan: She will continue to wrap the second toe of the left foot and use compression stockings. May consider an injection was 3 months postoperative has past and all the swelling has resolved. Also discussed a skin plasty of the right second toe to help straighten it perfectly however we will wait until the swelling resolves to find out final position.

## 2014-07-13 ENCOUNTER — Other Ambulatory Visit: Payer: Self-pay | Admitting: Obstetrics & Gynecology

## 2014-07-13 DIAGNOSIS — N898 Other specified noninflammatory disorders of vagina: Secondary | ICD-10-CM | POA: Diagnosis not present

## 2014-07-13 DIAGNOSIS — N951 Menopausal and female climacteric states: Secondary | ICD-10-CM | POA: Diagnosis not present

## 2014-07-16 LAB — CYTOLOGY - PAP

## 2014-07-21 ENCOUNTER — Other Ambulatory Visit: Payer: Self-pay | Admitting: Obstetrics & Gynecology

## 2014-07-21 DIAGNOSIS — Z01419 Encounter for gynecological examination (general) (routine) without abnormal findings: Secondary | ICD-10-CM | POA: Diagnosis not present

## 2014-07-21 DIAGNOSIS — Z124 Encounter for screening for malignant neoplasm of cervix: Secondary | ICD-10-CM | POA: Diagnosis not present

## 2014-07-22 LAB — CYTOLOGY - PAP

## 2014-08-12 ENCOUNTER — Other Ambulatory Visit: Payer: Self-pay | Admitting: Physician Assistant

## 2014-08-12 DIAGNOSIS — L281 Prurigo nodularis: Secondary | ICD-10-CM | POA: Diagnosis not present

## 2014-08-12 DIAGNOSIS — D485 Neoplasm of uncertain behavior of skin: Secondary | ICD-10-CM | POA: Diagnosis not present

## 2014-08-14 ENCOUNTER — Ambulatory Visit (INDEPENDENT_AMBULATORY_CARE_PROVIDER_SITE_OTHER): Payer: Medicare Other | Admitting: Podiatrist

## 2014-08-14 ENCOUNTER — Encounter: Payer: Self-pay | Admitting: Podiatrist

## 2014-08-14 ENCOUNTER — Ambulatory Visit (INDEPENDENT_AMBULATORY_CARE_PROVIDER_SITE_OTHER): Payer: Medicare Other

## 2014-08-14 VITALS — BP 129/68 | HR 78 | Resp 13

## 2014-08-14 DIAGNOSIS — M2040 Other hammer toe(s) (acquired), unspecified foot: Secondary | ICD-10-CM

## 2014-08-14 DIAGNOSIS — Z09 Encounter for follow-up examination after completed treatment for conditions other than malignant neoplasm: Secondary | ICD-10-CM

## 2014-08-14 NOTE — Progress Notes (Signed)
  Chief Complaint  Patient presents with  . Routine Post Op    DOS 05/13/2014 "I guess they're okay, I still don't have the same sensation, and it still doesn't look like what I thought it should, and I'm not up to my normal activities yet."  B/L 2nd Hammer toe repair     HPI: Patient is 58 y.o. female who presents today for post of follow up s/p most recently arthroplasty dipj right and fusion pipj left second digits.  She is overall still unhappy with the appearance and feeling in the feet. She relates the second toes are crowded by the first toes and uncomfortable.  There is still swelling in the left second toe and some discomfort when walking  Physical Exam  Neurovascular status is intact and unchanged. The right hallux is slightly laterally deviated and pressing on the right second toe. The left hallux is also slightly laterally deviated and pressing on the left second toe. Second toe is moderately swollen and in rectus alignment and position. X-rays look great. Fixation in good alignment and position. Status post bilateral foot surgery  Assessment: Status post bilateral foot surgery  Plan: Recommended another round of physical therapy with Barbaraann Barthel. We'll try and get the swelling down and help with range of motion. Would consider an Aiken osteotomy bilateral if she still complains of crowding. We'll see her back in 4 weeks for recheck to check on her progress.

## 2014-08-18 NOTE — Addendum Note (Signed)
Addended by: Lolita Rieger on: 08/18/2014 12:20 PM   Modules accepted: Orders

## 2014-08-19 DIAGNOSIS — Z23 Encounter for immunization: Secondary | ICD-10-CM | POA: Diagnosis not present

## 2014-08-31 DIAGNOSIS — M2042 Other hammer toe(s) (acquired), left foot: Secondary | ICD-10-CM | POA: Diagnosis not present

## 2014-08-31 DIAGNOSIS — M25674 Stiffness of right foot, not elsewhere classified: Secondary | ICD-10-CM | POA: Diagnosis not present

## 2014-08-31 DIAGNOSIS — M2041 Other hammer toe(s) (acquired), right foot: Secondary | ICD-10-CM | POA: Diagnosis not present

## 2014-08-31 DIAGNOSIS — M25675 Stiffness of left foot, not elsewhere classified: Secondary | ICD-10-CM | POA: Diagnosis not present

## 2014-09-03 DIAGNOSIS — M25674 Stiffness of right foot, not elsewhere classified: Secondary | ICD-10-CM | POA: Diagnosis not present

## 2014-09-03 DIAGNOSIS — M2041 Other hammer toe(s) (acquired), right foot: Secondary | ICD-10-CM | POA: Diagnosis not present

## 2014-09-03 DIAGNOSIS — M2042 Other hammer toe(s) (acquired), left foot: Secondary | ICD-10-CM | POA: Diagnosis not present

## 2014-09-03 DIAGNOSIS — M25675 Stiffness of left foot, not elsewhere classified: Secondary | ICD-10-CM | POA: Diagnosis not present

## 2014-09-08 DIAGNOSIS — M2041 Other hammer toe(s) (acquired), right foot: Secondary | ICD-10-CM | POA: Diagnosis not present

## 2014-09-08 DIAGNOSIS — M2042 Other hammer toe(s) (acquired), left foot: Secondary | ICD-10-CM | POA: Diagnosis not present

## 2014-09-08 DIAGNOSIS — M25674 Stiffness of right foot, not elsewhere classified: Secondary | ICD-10-CM | POA: Diagnosis not present

## 2014-09-08 DIAGNOSIS — M25675 Stiffness of left foot, not elsewhere classified: Secondary | ICD-10-CM | POA: Diagnosis not present

## 2014-09-11 ENCOUNTER — Encounter: Payer: Self-pay | Admitting: Podiatrist

## 2014-09-11 ENCOUNTER — Ambulatory Visit (INDEPENDENT_AMBULATORY_CARE_PROVIDER_SITE_OTHER): Payer: Medicare Other | Admitting: Podiatrist

## 2014-09-11 VITALS — BP 99/56 | HR 81 | Resp 16

## 2014-09-11 DIAGNOSIS — M2041 Other hammer toe(s) (acquired), right foot: Secondary | ICD-10-CM | POA: Diagnosis not present

## 2014-09-11 DIAGNOSIS — M25674 Stiffness of right foot, not elsewhere classified: Secondary | ICD-10-CM | POA: Diagnosis not present

## 2014-09-11 DIAGNOSIS — M2042 Other hammer toe(s) (acquired), left foot: Secondary | ICD-10-CM | POA: Diagnosis not present

## 2014-09-11 DIAGNOSIS — Z09 Encounter for follow-up examination after completed treatment for conditions other than malignant neoplasm: Secondary | ICD-10-CM

## 2014-09-11 DIAGNOSIS — M2012 Hallux valgus (acquired), left foot: Secondary | ICD-10-CM

## 2014-09-11 DIAGNOSIS — M25675 Stiffness of left foot, not elsewhere classified: Secondary | ICD-10-CM | POA: Diagnosis not present

## 2014-09-11 DIAGNOSIS — M2040 Other hammer toe(s) (acquired), unspecified foot: Secondary | ICD-10-CM

## 2014-09-11 NOTE — Patient Instructions (Signed)
Raynaud's Syndrome Raynaud's Syndrome is a disorder of the blood vessels in your hands and feet. It occurs when small arteries of the arms/hands or legs/feet become sensitive to cold or emotional upset. This causes the arteries to constrict, or narrow, and reduces blood flow to the area. The color in the fingers or toes changes from white to bluish to red and this is not usually painful. There may be numbness and tingling. Sores on the skin (ulcers) can form. Symptoms are usually relieved by warming. HOME CARE INSTRUCTIONS   Avoid exposure to cold. Keep your whole body warm and dry. Dress in layers. Wear mittens or gloves when handling ice or frozen food and when outdoors. Use holders for glasses or cans containing cold drinks. If possible, stay indoors during cold weather.  Limit your use of caffeine. Switch to decaffeinated coffee, tea, and soda pop. Avoid chocolate.  Avoid smoking or being around cigarette smoke. Smoke will make symptoms worse.  Wear loose fitting socks and comfortable, roomy shoes.  Avoid vibrating tools and machinery.  If possible, avoid stressful and emotional situations. Exercise, meditation and yoga may help you cope with stress. Biofeedback may be useful.  Ask your caregiver about medicine (calcium channel blockers) that may control Raynaud's phenomena. SEEK MEDICAL CARE IF:   Your discomfort becomes worse, despite conservative treatment.  You develop sores on your fingers and toes that do not heal. Document Released: 10/13/2000 Document Revised: 01/08/2012 Document Reviewed: 10/20/2008 ExitCare Patient Information 2015 [employer]. This information is not intended to replace advice given to you by your health care provider. Make sure you discuss any questions you have with your health care provider.  

## 2014-09-11 NOTE — Progress Notes (Signed)
Inject left 2nd toedya.. raynauds               HPI: Patient is 58 y.o. female who presents today for post of follow up s/p most recently arthroplasty dipj right and fusion pipj left second digits.  She states the second toes are still swollen and are rubbing against the great toes. There is still swelling in the left second toe and some discomfort when walking.     Physical Exam  Neurovascular status is intact and unchanged. The right hallux is slightly laterally deviated and pressing on the right second toe. The left hallux is also slightly laterally deviated and pressing on the left second toe. Second toe left is moderately swollen and in rectus alignment and position.  Assessment: Status post bilateral foot surgery  Plan: injected the left 2nd toe with kenalog and marcaine mix at the base due to her continued swelling.  Recommended she continue to see Barbaraann Barthel.  Discussed the possibility that she may have signs of raynauds, dispensed information so that she may read over and see if she notices any of these signs or symptoms.  We will wait until the swelling has had an adequate amount of time to decrease.  She will be seen back in 4 weeks.

## 2014-09-14 DIAGNOSIS — Z1231 Encounter for screening mammogram for malignant neoplasm of breast: Secondary | ICD-10-CM | POA: Diagnosis not present

## 2014-09-28 DIAGNOSIS — Z8249 Family history of ischemic heart disease and other diseases of the circulatory system: Secondary | ICD-10-CM | POA: Diagnosis not present

## 2014-09-28 DIAGNOSIS — E039 Hypothyroidism, unspecified: Secondary | ICD-10-CM | POA: Diagnosis not present

## 2014-09-28 DIAGNOSIS — Z780 Asymptomatic menopausal state: Secondary | ICD-10-CM | POA: Diagnosis not present

## 2014-09-30 DIAGNOSIS — R202 Paresthesia of skin: Secondary | ICD-10-CM | POA: Diagnosis not present

## 2014-09-30 DIAGNOSIS — Z09 Encounter for follow-up examination after completed treatment for conditions other than malignant neoplasm: Secondary | ICD-10-CM | POA: Diagnosis not present

## 2014-09-30 DIAGNOSIS — R208 Other disturbances of skin sensation: Secondary | ICD-10-CM | POA: Diagnosis not present

## 2014-09-30 DIAGNOSIS — T798XXA Other early complications of trauma, initial encounter: Secondary | ICD-10-CM | POA: Diagnosis not present

## 2014-09-30 DIAGNOSIS — B958 Unspecified staphylococcus as the cause of diseases classified elsewhere: Secondary | ICD-10-CM | POA: Diagnosis not present

## 2014-10-09 ENCOUNTER — Encounter: Payer: Self-pay | Admitting: Podiatrist

## 2014-10-09 ENCOUNTER — Ambulatory Visit (INDEPENDENT_AMBULATORY_CARE_PROVIDER_SITE_OTHER): Payer: Medicare Other | Admitting: Podiatrist

## 2014-10-09 VITALS — BP 114/63 | HR 94 | Resp 16

## 2014-10-09 DIAGNOSIS — M2040 Other hammer toe(s) (acquired), unspecified foot: Secondary | ICD-10-CM

## 2014-10-09 NOTE — Patient Instructions (Signed)
You have been prescribed a topical pain medications through a specialty compounding pharmacy called [facility] out of [place], [place]. A representative from the pharmacy will call to ensure you would like to receive the medication. If you do not hear from them within two business days please call to confirm they have received the prescription. Their telephone number is [phone].    If you have any other questions or concerns regarding the medication please call our office.

## 2014-10-12 NOTE — Progress Notes (Signed)
                HPI: Patient is 58 y.o. female who presents today for post of follow up s/p most recently arthroplasty dipj right and fusion pipj left second digits.  She states the second toes are still swollen, purple and are rubbing against the great toes. There is still swelling in the left second toe and some discomfort when walking.  She has been going to PT with Barbaraann Barthel and is finding no improvement.     Physical Exam  Neurovascular status is intact.  There is a purlpish discoloration to the left 2nd toe in comparison to the right. The right hallux is slightly laterally deviated and pressing on the right second toe. The left hallux is also slightly laterally deviated and pressing on the left second toe. Second toe left is moderately swollen and in rectus alignment and position.-- slightly improved from the last visit where I injected the base of the toe.   Assessment: Status post bilateral foot surgery, purplish discoloration of the left 2nd toe.   Plan: recommened a topical compounding cream to try and increase blood flow and decrease the skin discoloration to the toe.  Will rx this at [facility] for [place].  She will be seen back in 6 weeks for follow up.

## 2014-10-13 ENCOUNTER — Telehealth: Payer: Self-pay | Admitting: *Deleted

## 2014-10-13 NOTE — Telephone Encounter (Signed)
Prescription sent to [facility] for Verapamil 10% cream, smallest size available. Diagnosis: Hammer Toe, Refills: 0

## 2014-10-13 NOTE — Telephone Encounter (Signed)
-----   Message from Bronson Ing, DPM sent at 10/12/2014 12:58 PM EST ----- Regarding: [facility] rx Hey D,  Could you send an RX into [facility] for [place].  I need a write in rx of plain verapimil 10% and the smallest size they have.  Thanks!!

## 2014-10-15 DIAGNOSIS — N951 Menopausal and female climacteric states: Secondary | ICD-10-CM | POA: Diagnosis not present

## 2014-11-03 DIAGNOSIS — E780 Pure hypercholesterolemia: Secondary | ICD-10-CM | POA: Diagnosis not present

## 2014-11-03 DIAGNOSIS — E039 Hypothyroidism, unspecified: Secondary | ICD-10-CM | POA: Diagnosis not present

## 2014-11-03 DIAGNOSIS — N951 Menopausal and female climacteric states: Secondary | ICD-10-CM | POA: Diagnosis not present

## 2014-11-03 DIAGNOSIS — Z8249 Family history of ischemic heart disease and other diseases of the circulatory system: Secondary | ICD-10-CM | POA: Diagnosis not present

## 2014-11-20 ENCOUNTER — Ambulatory Visit: Payer: Medicare Other | Admitting: Podiatrist

## 2014-12-02 ENCOUNTER — Encounter: Payer: Self-pay | Admitting: Podiatrist

## 2014-12-02 ENCOUNTER — Ambulatory Visit (INDEPENDENT_AMBULATORY_CARE_PROVIDER_SITE_OTHER): Payer: Medicare Other | Admitting: Podiatrist

## 2014-12-02 VITALS — BP 121/72 | HR 76 | Resp 12

## 2014-12-02 DIAGNOSIS — Z09 Encounter for follow-up examination after completed treatment for conditions other than malignant neoplasm: Secondary | ICD-10-CM

## 2014-12-04 DIAGNOSIS — J45998 Other asthma: Secondary | ICD-10-CM | POA: Diagnosis not present

## 2014-12-04 DIAGNOSIS — E039 Hypothyroidism, unspecified: Secondary | ICD-10-CM | POA: Diagnosis not present

## 2014-12-04 DIAGNOSIS — K219 Gastro-esophageal reflux disease without esophagitis: Secondary | ICD-10-CM | POA: Diagnosis not present

## 2014-12-04 DIAGNOSIS — Z0000 Encounter for general adult medical examination without abnormal findings: Secondary | ICD-10-CM | POA: Diagnosis not present

## 2014-12-04 DIAGNOSIS — Z23 Encounter for immunization: Secondary | ICD-10-CM | POA: Diagnosis not present

## 2014-12-04 DIAGNOSIS — R5382 Chronic fatigue, unspecified: Secondary | ICD-10-CM | POA: Diagnosis not present

## 2014-12-04 DIAGNOSIS — F329 Major depressive disorder, single episode, unspecified: Secondary | ICD-10-CM | POA: Diagnosis not present

## 2014-12-04 DIAGNOSIS — E663 Overweight: Secondary | ICD-10-CM | POA: Diagnosis not present

## 2014-12-04 DIAGNOSIS — T63441A Toxic effect of venom of bees, accidental (unintentional), initial encounter: Secondary | ICD-10-CM | POA: Diagnosis not present

## 2014-12-04 DIAGNOSIS — F411 Generalized anxiety disorder: Secondary | ICD-10-CM | POA: Diagnosis not present

## 2014-12-04 DIAGNOSIS — L959 Vasculitis limited to the skin, unspecified: Secondary | ICD-10-CM | POA: Diagnosis not present

## 2014-12-10 DIAGNOSIS — T798XXA Other early complications of trauma, initial encounter: Secondary | ICD-10-CM | POA: Diagnosis not present

## 2014-12-10 DIAGNOSIS — B958 Unspecified staphylococcus as the cause of diseases classified elsewhere: Secondary | ICD-10-CM | POA: Diagnosis not present

## 2014-12-10 DIAGNOSIS — L080 Pyoderma: Secondary | ICD-10-CM | POA: Diagnosis not present

## 2014-12-10 DIAGNOSIS — R208 Other disturbances of skin sensation: Secondary | ICD-10-CM | POA: Diagnosis not present

## 2014-12-20 NOTE — Progress Notes (Signed)
Chief Complaint  Patient presents with  . Routine Post Op    "General stuff I have numbness in two toes and discolor when I've been up on them too much and they're definitely swollen at the end of the day."  right 1, 2nd toes - numb, and left 2nd toe changes color                   HPI: Patient is 59 y.o. female who presents today for post of follow up s/p most recently arthroplasty dipj right and fusion pipj left second digits.  She states the second toes are still swollen, purple and are rubbing against the great toes. There is still swelling in the left second toe and some discomfort when walking.  She has tried topical creams, physical therapy, manual manipulation and has had no relief or improvement  Physical Exam  Neurovascular status is intact.  There is a purlpish discoloration to the left 2nd toe in comparison to the right. The right hallux is slightly laterally deviated and pressing on the right second toe. The left hallux is also slightly laterally deviated and pressing on the left second toe. Second toe left is moderately swollen and in rectus alignment and position.-- no improvement where I injected the base of the toe.  Chronic swelling, discoloration, and discomfort are reported since surgery with minimal improvement,  Coolness to the toes and purplish discoloration could indicate small vessel raynauds type symptoms  Assessment: Status post bilateral foot surgery, purplish discoloration of the left 2nd toe. - questionable raynauds  Plan: discussed that we have exhausted conservative therapies with the exception of TENS. She is willing to try a tens unit to address this post operative condition.  I will have this ordered for her.  She will call if she feels no improvement is noted after 4 weeks of therapy.

## 2014-12-21 ENCOUNTER — Telehealth: Payer: Self-pay | Admitting: *Deleted

## 2014-12-21 NOTE — Telephone Encounter (Signed)
Entered in error.  Patient has 2 charts.

## 2014-12-21 NOTE — Telephone Encounter (Signed)
-----   Message from Bronson Ing, DPM sent at 12/20/2014 10:14 PM EST ----- Regarding: tens unit for [name]? Did we order the tens unit for [name] yet?  Just happened to think about it.  Thanks!  E

## 2014-12-21 NOTE — Telephone Encounter (Signed)
"  Call me today.  Follow-up on TENS Unit from 12/02/2014 appointment.  This is my second call.  I am really feeling like you do not want to keep me as a patient."    I attempted to contact [name] and [name] at [facility] for the TENS units but both numbers were no longer in service.  I left a message at the [place] office to give me a call.  I returned her call and apologized.  We are trying to find a representative to order it from.  The company we normally use, [employer], is apparently going through some transitions.  We are trying to find out who our new representative is and where we can order it from if not through them.  "You'll keep me in the loop I hope."  Yes, we will definitely keep you informed.

## 2014-12-21 NOTE — Telephone Encounter (Signed)
I received a call from [facility] and they gave me Hunter Caudle's number.  It is [phone].  I called and spoke to [facility] and he requested demographic and insurance information.  Fax it to [phone].  I faxed him the requested information.

## 2014-12-25 DIAGNOSIS — D239 Other benign neoplasm of skin, unspecified: Secondary | ICD-10-CM | POA: Diagnosis not present

## 2014-12-25 DIAGNOSIS — L281 Prurigo nodularis: Secondary | ICD-10-CM | POA: Diagnosis not present

## 2014-12-28 DIAGNOSIS — E063 Autoimmune thyroiditis: Secondary | ICD-10-CM | POA: Diagnosis not present

## 2014-12-28 DIAGNOSIS — N951 Menopausal and female climacteric states: Secondary | ICD-10-CM | POA: Diagnosis not present

## 2014-12-30 DIAGNOSIS — D539 Nutritional anemia, unspecified: Secondary | ICD-10-CM | POA: Diagnosis not present

## 2014-12-30 DIAGNOSIS — E559 Vitamin D deficiency, unspecified: Secondary | ICD-10-CM | POA: Diagnosis not present

## 2014-12-30 DIAGNOSIS — E063 Autoimmune thyroiditis: Secondary | ICD-10-CM | POA: Diagnosis not present

## 2014-12-30 DIAGNOSIS — R7989 Other specified abnormal findings of blood chemistry: Secondary | ICD-10-CM | POA: Diagnosis not present

## 2014-12-30 DIAGNOSIS — E780 Pure hypercholesterolemia: Secondary | ICD-10-CM | POA: Diagnosis not present

## 2015-01-12 DIAGNOSIS — N951 Menopausal and female climacteric states: Secondary | ICD-10-CM | POA: Diagnosis not present

## 2015-01-12 DIAGNOSIS — N898 Other specified noninflammatory disorders of vagina: Secondary | ICD-10-CM | POA: Diagnosis not present

## 2015-01-13 DIAGNOSIS — E063 Autoimmune thyroiditis: Secondary | ICD-10-CM | POA: Diagnosis not present

## 2015-01-13 DIAGNOSIS — N951 Menopausal and female climacteric states: Secondary | ICD-10-CM | POA: Diagnosis not present

## 2015-03-10 DIAGNOSIS — N951 Menopausal and female climacteric states: Secondary | ICD-10-CM | POA: Diagnosis not present

## 2015-03-10 DIAGNOSIS — E780 Pure hypercholesterolemia: Secondary | ICD-10-CM | POA: Diagnosis not present

## 2015-03-15 DIAGNOSIS — E063 Autoimmune thyroiditis: Secondary | ICD-10-CM | POA: Diagnosis not present

## 2015-03-15 DIAGNOSIS — R635 Abnormal weight gain: Secondary | ICD-10-CM | POA: Diagnosis not present

## 2015-03-15 DIAGNOSIS — E039 Hypothyroidism, unspecified: Secondary | ICD-10-CM | POA: Diagnosis not present

## 2015-03-22 ENCOUNTER — Encounter: Payer: Self-pay | Admitting: Cardiology

## 2015-03-22 ENCOUNTER — Ambulatory Visit (INDEPENDENT_AMBULATORY_CARE_PROVIDER_SITE_OTHER): Payer: Medicare Other | Admitting: Cardiology

## 2015-03-22 VITALS — BP 110/70 | HR 88 | Ht 65.0 in | Wt 174.2 lb

## 2015-03-22 DIAGNOSIS — I951 Orthostatic hypotension: Secondary | ICD-10-CM | POA: Diagnosis not present

## 2015-03-22 DIAGNOSIS — I471 Supraventricular tachycardia: Secondary | ICD-10-CM

## 2015-03-22 MED ORDER — METOPROLOL SUCCINATE ER 25 MG PO TB24: 25.0000 mg | ORAL_TABLET | Freq: Every day | ORAL | Status: DC

## 2015-03-22 NOTE — Progress Notes (Signed)
[facility] [address], [address], [place]  [address] Phone: [phone] Fax:  [phone]  Date:  03/22/2015   ID:  [name], DOB 06-May-1956, MRN [id number]  PCP:  Tawanna Solo, MD   History of Present Illness: [name] is a 59 y.o. female with PAT (paroxysmal atrial tachycardia) who underwent echocardiogram and nuclear stress test on 07/11/10 both reassuring showing normal EF, no ischemia, mild diastolic dysfunction here for followup. This testing was performed secondary to burning/chest tightness and breathlessness. Possible GERD.she has had an evaluation by pulmonary medicine and was given Advair and this has helped considerably with her breathlessness. She was diagnosed with asthma.  She's also had paroxysmal atrial tachycardia for which beta blocker is being utilized and helpful.   Unfortunately her main concerns currently are her autoimmune condition, leukocytoclastic vasculitis. Her dermatologist has been prescribing her methotrexate. She is being monitored closely. This can make her skin very sensitive to the touch.  02/12/13 - this has been a stressful year for her. Her mother with Alzheimer's has been sick. She has moved. She found that she had a sister. Overall, no significant cardiac symptoms. She has gained some weight.   03/04/14 - Sister with stroke. Feeding tube. At one point she was concerned about her on primary prevention for stroke and she cannot take aspirin because of severe allergy. She has not been taking Plavix for primary prevention. I'm fine with this. She has lost a good amount of weight. Recent hammertoe surgery, bunion removal. Had a postoperative infection. Taking antibiotics.  02/2015 - Orthostatic, dizzy cut back on Toprol.  Sister died 13-Dec-2022, mother died as well. No chest pain, no shortness of breath, no syncope. She was having orthostatic dizziness.   Wt Readings from Last 3 Encounters:  03/22/15 174 lb 4 oz (79.039 kg)  07/10/14 160 lb  (72.576 kg)  05/20/14 157 lb (71.215 kg)     Past Medical History  Diagnosis Date  . Chronic fatigue syndrome     Dr. Sabra Heck  . High triglycerides   . Low HDL (under 40)   . Back pain   . Hypothyroidism     Dr. Lewis Shock 4/14  . Esophageal spasm     GERD Dr. Sabra Heck, Dr. Wynetta Emery; egd 2006 nl; UGI 3/13 Small HH, moderate GERD, nl motility; seen at [facility] ([place]), egd? neg, sone improvement on sucralfate susp  . Paroxysmal atrial tachycardia     Dr. Marlou Porch  . Uterine carcinoma     Dr. Polly Cobia  . Pernicious anemia     Dr. Sabra Heck  . Leukocytoclastic vasculitis     Dr. Tonia Brooms  . Foot pain     Dr. Rushie Nyhan  . RLS (restless legs syndrome)     low ferritin Dr. Sabra Heck  . Cough     /SOB, PFT's nl, improved with Bronchodilation 8/11  . Reflux     ? delayed gastric emptying--GES nl 01/2012 (6% at 2hrs)    Past Surgical History  Procedure Laterality Date  . Bunionectomy    . Metatarsal osteotomy    . Hammer toe surgery    . US echocardiography      with Nuclear test, Dr. Marlou Porch, Low risk  . Lung biopsy      L Lower lung pulm nodules, largest 4.29mm, low risk, repeat CT in 1 yr now following with pulm at [facility]; 9/13 Stable tiny lung nodules, no f/u needed  . Colonoscopy      scr, 12/2008 (  [facility]) nl    Current Outpatient Prescriptions  Medication Sig Dispense Refill  . buPROPion (WELLBUTRIN XL) 150 MG 24 hr tablet Take 150 mg by mouth daily.    Marland Kitchen dexlansoprazole (DEXILANT) 60 MG capsule Take 60 mg by mouth daily.    . DULoxetine (CYMBALTA) 30 MG capsule Take 90 mg by mouth daily.     Marland Kitchen estradiol (CLIMARA - DOSED IN MG/24 HR) 0.075 mg/24hr patch Place 0.075 mg onto the skin once a week.    . estradiol (ESTRACE) 0.1 MG/GM vaginal cream Place 1 Applicatorful vaginally at bedtime.    . fexofenadine (ALLEGRA) 180 MG tablet Take 180 mg by mouth daily.    . fluticasone-salmeterol (ADVAIR HFA) 115-21 MCG/ACT inhaler Inhale 2 puffs into the lungs 2 (two) times daily.    Marland Kitchen gabapentin  (NEURONTIN) 100 MG capsule Take 100 mg by mouth 4 (four) times daily.    Marland Kitchen levothyroxine (SYNTHROID, LEVOTHROID) 150 MCG tablet Take 150 mcg by mouth daily before breakfast.    . metoprolol succinate (TOPROL-XL) 25 MG 24 hr tablet Take 25 mg by mouth daily.    . nitroGLYCERIN (NITROSTAT) 0.4 MG SL tablet Place 0.4 mg under the tongue every 5 (five) minutes as needed for chest pain.    . NON FORMULARY Clobestasol As directed    . pentosan polysulfate (ELMIRON) 100 MG capsule Take 100 mg by mouth 2 (two) times daily.     . traMADol (ULTRAM) 50 MG tablet Take by mouth every 6 (six) hours as needed.    . mirabegron ER (MYRBETRIQ) 50 MG TB24 tablet Take 50 mg by mouth daily.    . vitamin B-12 (CYANOCOBALAMIN) 1000 MCG tablet Take 1,000 mcg by mouth daily.     No current facility-administered medications for this visit.    Allergies:    Allergies  Allergen Reactions  . Aspirin Anaphylaxis  . Ciprofloxacin Nausea And Vomiting  . Ambien [Zolpidem Tartrate]     Hallucinations  . Minocycline     Rash  . Shellfish Allergy     Rash  . Latex Rash    Social History:  The patient  reports that she has never smoked. She does not have any smokeless tobacco history on file. She reports that she does not drink alcohol or use illicit drugs.   No family history on file.  ROS:  Please see the history of present illness.   Denies any fevers, chills, orthopnea, PND   All other systems reviewed and negative.   PHYSICAL EXAM: VS:  BP 110/70 mmHg  Pulse 88  Ht 5\' 5"  (1.651 m)  Wt 174 lb 4 oz (79.039 kg)  BMI 29.00 kg/m2  SpO2 98% Well nourished, well developed, in no acute distress HEENT: normal, [place]/AT, EOMI Neck: no JVD, normal carotid upstroke, no bruit Cardiac:  normal S1, S2; RRR; no murmur Lungs:  clear to auscultation bilaterally, no wheezing, rhonchi or rales Abd: soft, nontender, no hepatomegaly, no bruits Ext: no edema, foot in soft device Skin: warm and dry GU: deferred Neuro: no  focal abnormalities noted, AAO x 3  EKG:  03/22/15-normal rhythm, 82, nonspecific T-wave changes-03/04/14-sinus rhythm, 83, LVH, left axis deviation     ASSESSMENT AND PLAN:  1. Paroxysmal atrial tachycardia-decreased metoprolol from 50 to 25 mg. Felt dizzy at one point. Orthostatic.  2. Family history of stroke-sister, unfortunately died. Continue with aggressive primary prevention strategies, diet, exercise. LDL was 100 on 12/14. At one point I have given her Plavix to take at her  request for primary prevention because she cannot take aspirin. She has not been taking the Plavix. I did tell her that this was rather aggressive to take for primary prevention and could cause bleeding. At this point, we will not use Plavix. Continue exercise. 3. 1 year follow up.  Signed, Candee Furbish, MD [facility]  03/22/2015 11:15 AM

## 2015-03-22 NOTE — Patient Instructions (Signed)
Medication Instructions:  Your physician recommends that you continue on your current medications as directed. Please refer to the Current Medication list given to you today.  Follow-Up: Follow up in 1 year with Dr. Skains.  You will receive a letter in the mail 2 months before you are due.  Please call us when you receive this letter to schedule your follow up appointment.  Thank you for choosing [facility]!!       

## 2015-04-14 DIAGNOSIS — M62838 Other muscle spasm: Secondary | ICD-10-CM | POA: Diagnosis not present

## 2015-04-14 DIAGNOSIS — R0789 Other chest pain: Secondary | ICD-10-CM | POA: Diagnosis not present

## 2015-04-28 DIAGNOSIS — E039 Hypothyroidism, unspecified: Secondary | ICD-10-CM | POA: Diagnosis not present

## 2015-04-29 ENCOUNTER — Telehealth: Payer: Self-pay | Admitting: *Deleted

## 2015-04-29 ENCOUNTER — Ambulatory Visit: Payer: Medicare Other

## 2015-04-29 ENCOUNTER — Ambulatory Visit (INDEPENDENT_AMBULATORY_CARE_PROVIDER_SITE_OTHER): Payer: Medicare Other | Admitting: Podiatry

## 2015-04-29 ENCOUNTER — Encounter: Payer: Self-pay | Admitting: Podiatry

## 2015-04-29 VITALS — BP 116/72 | HR 77 | Resp 15

## 2015-04-29 DIAGNOSIS — M79671 Pain in right foot: Secondary | ICD-10-CM

## 2015-04-29 DIAGNOSIS — M779 Enthesopathy, unspecified: Secondary | ICD-10-CM

## 2015-04-29 DIAGNOSIS — M79672 Pain in left foot: Secondary | ICD-10-CM

## 2015-04-29 MED ORDER — DICLOFENAC SODIUM 75 MG PO TBEC: 75.0000 mg | DELAYED_RELEASE_TABLET | Freq: Two times a day (BID) | ORAL | Status: DC

## 2015-04-29 MED ORDER — TRIAMCINOLONE ACETONIDE 10 MG/ML IJ SUSP
10.0000 mg | Freq: Once | INTRAMUSCULAR | Status: AC
  Administered 2015-04-29: 10 mg

## 2015-04-29 NOTE — Telephone Encounter (Signed)
Pt states she was seen today and was to get an antiinflammatory, but it was not in the pharmacy.  Dr. Paulla Dolly ordered Diclofenac 75mg  #30 1 tablet bid no refills.

## 2015-04-29 NOTE — Telephone Encounter (Signed)
Entered in error

## 2015-04-29 NOTE — Progress Notes (Signed)
Subjective:     Patient ID: [name], female   DOB: 05/04/1956, 59 y.o.   MRN: [id number]  HPI patient presents stating that I have been having a lot of pain around my big toe joint right and my second toe joint left with inflammation especially over the last month. My mother passed away and I was on my feet quite a bit and that may be part of this problem but it seems to be getting gradually worse   Review of Systems     Objective:   Physical Exam Neurovascular status intact with an approximate two-year history of having osteotomy first and second metatarsal bilateral by Dr. Valentina Lucks. Patient's found to have inflammation and pain around the right hallux lateral side with fluid buildup and is noted to have some restriction of motion and on the left second MPJ it is quite tender when pressed    Assessment:     Inflammatory capsulitis of the first MPJ right and the second MPJ left with possibility of excessive standing causing some of the symptoms she is experiencing    Plan:     H&P x-rays reviewed and conditions discussed. Today I went ahead did a careful injection lateral joint right 3 mg Kenalog 5 mg Xylocaine and then went ahead for the left did proximal nerve block 60 mg I can Marcaine mixture aspirated the joint getting out clear fluid and injected with half cc of dexamethasone Kenalog. Reappoint to reevaluate in the next several weeks and also start diclofenac 75 mg twice a day

## 2015-06-02 DIAGNOSIS — M2550 Pain in unspecified joint: Secondary | ICD-10-CM | POA: Diagnosis not present

## 2015-06-02 DIAGNOSIS — M791 Myalgia: Secondary | ICD-10-CM | POA: Diagnosis not present

## 2015-06-02 DIAGNOSIS — E663 Overweight: Secondary | ICD-10-CM | POA: Diagnosis not present

## 2015-06-02 DIAGNOSIS — F411 Generalized anxiety disorder: Secondary | ICD-10-CM | POA: Diagnosis not present

## 2015-06-02 DIAGNOSIS — J45998 Other asthma: Secondary | ICD-10-CM | POA: Diagnosis not present

## 2015-06-02 DIAGNOSIS — R5382 Chronic fatigue, unspecified: Secondary | ICD-10-CM | POA: Diagnosis not present

## 2015-06-02 DIAGNOSIS — R109 Unspecified abdominal pain: Secondary | ICD-10-CM | POA: Diagnosis not present

## 2015-06-02 DIAGNOSIS — F329 Major depressive disorder, single episode, unspecified: Secondary | ICD-10-CM | POA: Diagnosis not present

## 2015-06-03 ENCOUNTER — Other Ambulatory Visit: Payer: Self-pay | Admitting: Family Medicine

## 2015-06-03 DIAGNOSIS — R109 Unspecified abdominal pain: Secondary | ICD-10-CM

## 2015-06-11 ENCOUNTER — Other Ambulatory Visit: Payer: Medicare Other

## 2015-06-11 ENCOUNTER — Ambulatory Visit
Admission: RE | Admit: 2015-06-11 | Discharge: 2015-06-11 | Disposition: A | Discharge status: Home / Self Care | Payer: Medicare Other | Source: Ambulatory Visit | Attending: Family Medicine | Admitting: Family Medicine

## 2015-06-11 DIAGNOSIS — M2550 Pain in unspecified joint: Secondary | ICD-10-CM | POA: Diagnosis not present

## 2015-06-11 DIAGNOSIS — M79641 Pain in right hand: Secondary | ICD-10-CM | POA: Diagnosis not present

## 2015-06-11 DIAGNOSIS — M79642 Pain in left hand: Secondary | ICD-10-CM | POA: Diagnosis not present

## 2015-06-11 DIAGNOSIS — M791 Myalgia: Secondary | ICD-10-CM | POA: Diagnosis not present

## 2015-06-11 DIAGNOSIS — M310 Hypersensitivity angiitis: Secondary | ICD-10-CM | POA: Diagnosis not present

## 2015-06-11 DIAGNOSIS — R109 Unspecified abdominal pain: Secondary | ICD-10-CM | POA: Diagnosis not present

## 2015-06-11 DIAGNOSIS — R5382 Chronic fatigue, unspecified: Secondary | ICD-10-CM | POA: Diagnosis not present

## 2015-06-11 MED ORDER — IOPAMIDOL (ISOVUE-300) INJECTION 61%
100.0000 mL | Freq: Once | INTRAVENOUS | Status: AC | PRN
  Administered 2015-06-11: 100 mL via INTRAVENOUS

## 2015-06-24 DIAGNOSIS — M545 Low back pain: Secondary | ICD-10-CM | POA: Diagnosis not present

## 2015-06-24 DIAGNOSIS — M310 Hypersensitivity angiitis: Secondary | ICD-10-CM | POA: Diagnosis not present

## 2015-06-24 DIAGNOSIS — R5382 Chronic fatigue, unspecified: Secondary | ICD-10-CM | POA: Diagnosis not present

## 2015-06-24 DIAGNOSIS — M791 Myalgia: Secondary | ICD-10-CM | POA: Diagnosis not present

## 2015-06-24 DIAGNOSIS — M2550 Pain in unspecified joint: Secondary | ICD-10-CM | POA: Diagnosis not present

## 2015-06-24 DIAGNOSIS — G8929 Other chronic pain: Secondary | ICD-10-CM | POA: Diagnosis not present

## 2015-06-29 DIAGNOSIS — N3010 Interstitial cystitis (chronic) without hematuria: Secondary | ICD-10-CM | POA: Diagnosis not present

## 2015-07-08 DIAGNOSIS — M545 Low back pain: Secondary | ICD-10-CM | POA: Diagnosis not present

## 2015-07-08 DIAGNOSIS — R2689 Other abnormalities of gait and mobility: Secondary | ICD-10-CM | POA: Diagnosis not present

## 2015-07-08 DIAGNOSIS — M5432 Sciatica, left side: Secondary | ICD-10-CM | POA: Diagnosis not present

## 2015-07-08 DIAGNOSIS — M6281 Muscle weakness (generalized): Secondary | ICD-10-CM | POA: Diagnosis not present

## 2015-07-12 DIAGNOSIS — M545 Low back pain: Secondary | ICD-10-CM | POA: Diagnosis not present

## 2015-07-12 DIAGNOSIS — M6281 Muscle weakness (generalized): Secondary | ICD-10-CM | POA: Diagnosis not present

## 2015-07-12 DIAGNOSIS — M5432 Sciatica, left side: Secondary | ICD-10-CM | POA: Diagnosis not present

## 2015-07-12 DIAGNOSIS — R2689 Other abnormalities of gait and mobility: Secondary | ICD-10-CM | POA: Diagnosis not present

## 2015-07-14 DIAGNOSIS — M5432 Sciatica, left side: Secondary | ICD-10-CM | POA: Diagnosis not present

## 2015-07-14 DIAGNOSIS — M545 Low back pain: Secondary | ICD-10-CM | POA: Diagnosis not present

## 2015-07-14 DIAGNOSIS — M6281 Muscle weakness (generalized): Secondary | ICD-10-CM | POA: Diagnosis not present

## 2015-07-14 DIAGNOSIS — R2689 Other abnormalities of gait and mobility: Secondary | ICD-10-CM | POA: Diagnosis not present

## 2015-07-15 DIAGNOSIS — R2689 Other abnormalities of gait and mobility: Secondary | ICD-10-CM | POA: Diagnosis not present

## 2015-07-15 DIAGNOSIS — M6281 Muscle weakness (generalized): Secondary | ICD-10-CM | POA: Diagnosis not present

## 2015-07-15 DIAGNOSIS — M545 Low back pain: Secondary | ICD-10-CM | POA: Diagnosis not present

## 2015-07-15 DIAGNOSIS — M5432 Sciatica, left side: Secondary | ICD-10-CM | POA: Diagnosis not present

## 2015-07-27 DIAGNOSIS — N951 Menopausal and female climacteric states: Secondary | ICD-10-CM | POA: Diagnosis not present

## 2015-07-28 DIAGNOSIS — M5432 Sciatica, left side: Secondary | ICD-10-CM | POA: Diagnosis not present

## 2015-07-28 DIAGNOSIS — M545 Low back pain: Secondary | ICD-10-CM | POA: Diagnosis not present

## 2015-07-28 DIAGNOSIS — M6281 Muscle weakness (generalized): Secondary | ICD-10-CM | POA: Diagnosis not present

## 2015-07-28 DIAGNOSIS — R2689 Other abnormalities of gait and mobility: Secondary | ICD-10-CM | POA: Diagnosis not present

## 2015-07-29 DIAGNOSIS — M5432 Sciatica, left side: Secondary | ICD-10-CM | POA: Diagnosis not present

## 2015-07-29 DIAGNOSIS — R2689 Other abnormalities of gait and mobility: Secondary | ICD-10-CM | POA: Diagnosis not present

## 2015-07-29 DIAGNOSIS — M6281 Muscle weakness (generalized): Secondary | ICD-10-CM | POA: Diagnosis not present

## 2015-07-29 DIAGNOSIS — M545 Low back pain: Secondary | ICD-10-CM | POA: Diagnosis not present

## 2015-08-02 ENCOUNTER — Other Ambulatory Visit: Payer: Self-pay | Admitting: Obstetrics & Gynecology

## 2015-08-02 DIAGNOSIS — M545 Low back pain: Secondary | ICD-10-CM | POA: Diagnosis not present

## 2015-08-02 DIAGNOSIS — M5432 Sciatica, left side: Secondary | ICD-10-CM | POA: Diagnosis not present

## 2015-08-02 DIAGNOSIS — R2689 Other abnormalities of gait and mobility: Secondary | ICD-10-CM | POA: Diagnosis not present

## 2015-08-02 DIAGNOSIS — M6281 Muscle weakness (generalized): Secondary | ICD-10-CM | POA: Diagnosis not present

## 2015-08-02 DIAGNOSIS — Z1272 Encounter for screening for malignant neoplasm of vagina: Secondary | ICD-10-CM | POA: Diagnosis not present

## 2015-08-03 LAB — CYTOLOGY - PAP

## 2015-08-04 DIAGNOSIS — M545 Low back pain: Secondary | ICD-10-CM | POA: Diagnosis not present

## 2015-08-04 DIAGNOSIS — M5432 Sciatica, left side: Secondary | ICD-10-CM | POA: Diagnosis not present

## 2015-08-04 DIAGNOSIS — R2689 Other abnormalities of gait and mobility: Secondary | ICD-10-CM | POA: Diagnosis not present

## 2015-08-04 DIAGNOSIS — M6281 Muscle weakness (generalized): Secondary | ICD-10-CM | POA: Diagnosis not present

## 2015-08-05 DIAGNOSIS — M6281 Muscle weakness (generalized): Secondary | ICD-10-CM | POA: Diagnosis not present

## 2015-08-05 DIAGNOSIS — M545 Low back pain: Secondary | ICD-10-CM | POA: Diagnosis not present

## 2015-08-05 DIAGNOSIS — R2689 Other abnormalities of gait and mobility: Secondary | ICD-10-CM | POA: Diagnosis not present

## 2015-08-05 DIAGNOSIS — M5432 Sciatica, left side: Secondary | ICD-10-CM | POA: Diagnosis not present

## 2015-08-11 DIAGNOSIS — R2689 Other abnormalities of gait and mobility: Secondary | ICD-10-CM | POA: Diagnosis not present

## 2015-08-11 DIAGNOSIS — M5432 Sciatica, left side: Secondary | ICD-10-CM | POA: Diagnosis not present

## 2015-08-11 DIAGNOSIS — M545 Low back pain: Secondary | ICD-10-CM | POA: Diagnosis not present

## 2015-08-11 DIAGNOSIS — M6281 Muscle weakness (generalized): Secondary | ICD-10-CM | POA: Diagnosis not present

## 2015-08-12 DIAGNOSIS — M6281 Muscle weakness (generalized): Secondary | ICD-10-CM | POA: Diagnosis not present

## 2015-08-12 DIAGNOSIS — R2689 Other abnormalities of gait and mobility: Secondary | ICD-10-CM | POA: Diagnosis not present

## 2015-08-12 DIAGNOSIS — M5432 Sciatica, left side: Secondary | ICD-10-CM | POA: Diagnosis not present

## 2015-08-12 DIAGNOSIS — M545 Low back pain: Secondary | ICD-10-CM | POA: Diagnosis not present

## 2015-08-16 DIAGNOSIS — R2689 Other abnormalities of gait and mobility: Secondary | ICD-10-CM | POA: Diagnosis not present

## 2015-08-16 DIAGNOSIS — M5432 Sciatica, left side: Secondary | ICD-10-CM | POA: Diagnosis not present

## 2015-08-16 DIAGNOSIS — M545 Low back pain: Secondary | ICD-10-CM | POA: Diagnosis not present

## 2015-08-16 DIAGNOSIS — M6281 Muscle weakness (generalized): Secondary | ICD-10-CM | POA: Diagnosis not present

## 2015-08-18 DIAGNOSIS — M545 Low back pain: Secondary | ICD-10-CM | POA: Diagnosis not present

## 2015-08-18 DIAGNOSIS — M6281 Muscle weakness (generalized): Secondary | ICD-10-CM | POA: Diagnosis not present

## 2015-08-18 DIAGNOSIS — R2689 Other abnormalities of gait and mobility: Secondary | ICD-10-CM | POA: Diagnosis not present

## 2015-08-18 DIAGNOSIS — M5432 Sciatica, left side: Secondary | ICD-10-CM | POA: Diagnosis not present

## 2015-08-19 DIAGNOSIS — M6281 Muscle weakness (generalized): Secondary | ICD-10-CM | POA: Diagnosis not present

## 2015-08-19 DIAGNOSIS — M545 Low back pain: Secondary | ICD-10-CM | POA: Diagnosis not present

## 2015-08-19 DIAGNOSIS — M5432 Sciatica, left side: Secondary | ICD-10-CM | POA: Diagnosis not present

## 2015-08-19 DIAGNOSIS — R2689 Other abnormalities of gait and mobility: Secondary | ICD-10-CM | POA: Diagnosis not present

## 2015-08-19 DIAGNOSIS — Z23 Encounter for immunization: Secondary | ICD-10-CM | POA: Diagnosis not present

## 2015-08-24 DIAGNOSIS — M791 Myalgia: Secondary | ICD-10-CM | POA: Diagnosis not present

## 2015-08-24 DIAGNOSIS — M2550 Pain in unspecified joint: Secondary | ICD-10-CM | POA: Diagnosis not present

## 2015-08-24 DIAGNOSIS — M545 Low back pain: Secondary | ICD-10-CM | POA: Diagnosis not present

## 2015-08-24 DIAGNOSIS — R5382 Chronic fatigue, unspecified: Secondary | ICD-10-CM | POA: Diagnosis not present

## 2015-08-24 DIAGNOSIS — G8929 Other chronic pain: Secondary | ICD-10-CM | POA: Diagnosis not present

## 2015-08-24 DIAGNOSIS — M5416 Radiculopathy, lumbar region: Secondary | ICD-10-CM | POA: Diagnosis not present

## 2015-08-24 DIAGNOSIS — M310 Hypersensitivity angiitis: Secondary | ICD-10-CM | POA: Diagnosis not present

## 2015-08-26 DIAGNOSIS — M5432 Sciatica, left side: Secondary | ICD-10-CM | POA: Diagnosis not present

## 2015-08-26 DIAGNOSIS — R2689 Other abnormalities of gait and mobility: Secondary | ICD-10-CM | POA: Diagnosis not present

## 2015-08-26 DIAGNOSIS — M545 Low back pain: Secondary | ICD-10-CM | POA: Diagnosis not present

## 2015-08-26 DIAGNOSIS — M6281 Muscle weakness (generalized): Secondary | ICD-10-CM | POA: Diagnosis not present

## 2015-08-27 ENCOUNTER — Other Ambulatory Visit: Payer: Self-pay | Admitting: Rheumatology

## 2015-08-27 DIAGNOSIS — M5416 Radiculopathy, lumbar region: Secondary | ICD-10-CM

## 2015-08-30 DIAGNOSIS — M5432 Sciatica, left side: Secondary | ICD-10-CM | POA: Diagnosis not present

## 2015-08-30 DIAGNOSIS — M545 Low back pain: Secondary | ICD-10-CM | POA: Diagnosis not present

## 2015-08-30 DIAGNOSIS — M6281 Muscle weakness (generalized): Secondary | ICD-10-CM | POA: Diagnosis not present

## 2015-08-30 DIAGNOSIS — R2689 Other abnormalities of gait and mobility: Secondary | ICD-10-CM | POA: Diagnosis not present

## 2015-08-31 DIAGNOSIS — M6281 Muscle weakness (generalized): Secondary | ICD-10-CM | POA: Diagnosis not present

## 2015-08-31 DIAGNOSIS — M5432 Sciatica, left side: Secondary | ICD-10-CM | POA: Diagnosis not present

## 2015-08-31 DIAGNOSIS — M545 Low back pain: Secondary | ICD-10-CM | POA: Diagnosis not present

## 2015-08-31 DIAGNOSIS — R2689 Other abnormalities of gait and mobility: Secondary | ICD-10-CM | POA: Diagnosis not present

## 2015-09-02 DIAGNOSIS — M6281 Muscle weakness (generalized): Secondary | ICD-10-CM | POA: Diagnosis not present

## 2015-09-02 DIAGNOSIS — M545 Low back pain: Secondary | ICD-10-CM | POA: Diagnosis not present

## 2015-09-02 DIAGNOSIS — R2689 Other abnormalities of gait and mobility: Secondary | ICD-10-CM | POA: Diagnosis not present

## 2015-09-02 DIAGNOSIS — M5432 Sciatica, left side: Secondary | ICD-10-CM | POA: Diagnosis not present

## 2015-09-06 DIAGNOSIS — M545 Low back pain: Secondary | ICD-10-CM | POA: Diagnosis not present

## 2015-09-06 DIAGNOSIS — R2689 Other abnormalities of gait and mobility: Secondary | ICD-10-CM | POA: Diagnosis not present

## 2015-09-06 DIAGNOSIS — M5432 Sciatica, left side: Secondary | ICD-10-CM | POA: Diagnosis not present

## 2015-09-06 DIAGNOSIS — M6281 Muscle weakness (generalized): Secondary | ICD-10-CM | POA: Diagnosis not present

## 2015-09-08 ENCOUNTER — Other Ambulatory Visit: Payer: Medicare Other

## 2015-09-08 DIAGNOSIS — M6281 Muscle weakness (generalized): Secondary | ICD-10-CM | POA: Diagnosis not present

## 2015-09-08 DIAGNOSIS — M545 Low back pain: Secondary | ICD-10-CM | POA: Diagnosis not present

## 2015-09-08 DIAGNOSIS — M5432 Sciatica, left side: Secondary | ICD-10-CM | POA: Diagnosis not present

## 2015-09-08 DIAGNOSIS — R2689 Other abnormalities of gait and mobility: Secondary | ICD-10-CM | POA: Diagnosis not present

## 2015-09-09 DIAGNOSIS — M6281 Muscle weakness (generalized): Secondary | ICD-10-CM | POA: Diagnosis not present

## 2015-09-09 DIAGNOSIS — M5432 Sciatica, left side: Secondary | ICD-10-CM | POA: Diagnosis not present

## 2015-09-09 DIAGNOSIS — R2689 Other abnormalities of gait and mobility: Secondary | ICD-10-CM | POA: Diagnosis not present

## 2015-09-09 DIAGNOSIS — M545 Low back pain: Secondary | ICD-10-CM | POA: Diagnosis not present

## 2015-09-15 DIAGNOSIS — M6281 Muscle weakness (generalized): Secondary | ICD-10-CM | POA: Diagnosis not present

## 2015-09-15 DIAGNOSIS — R2689 Other abnormalities of gait and mobility: Secondary | ICD-10-CM | POA: Diagnosis not present

## 2015-09-15 DIAGNOSIS — M545 Low back pain: Secondary | ICD-10-CM | POA: Diagnosis not present

## 2015-09-15 DIAGNOSIS — M5432 Sciatica, left side: Secondary | ICD-10-CM | POA: Diagnosis not present

## 2015-09-19 DIAGNOSIS — M6281 Muscle weakness (generalized): Secondary | ICD-10-CM | POA: Diagnosis not present

## 2015-09-19 DIAGNOSIS — M545 Low back pain: Secondary | ICD-10-CM | POA: Diagnosis not present

## 2015-09-19 DIAGNOSIS — R2689 Other abnormalities of gait and mobility: Secondary | ICD-10-CM | POA: Diagnosis not present

## 2015-09-19 DIAGNOSIS — M5432 Sciatica, left side: Secondary | ICD-10-CM | POA: Diagnosis not present

## 2015-09-20 DIAGNOSIS — R2689 Other abnormalities of gait and mobility: Secondary | ICD-10-CM | POA: Diagnosis not present

## 2015-09-20 DIAGNOSIS — M545 Low back pain: Secondary | ICD-10-CM | POA: Diagnosis not present

## 2015-09-20 DIAGNOSIS — M6281 Muscle weakness (generalized): Secondary | ICD-10-CM | POA: Diagnosis not present

## 2015-09-20 DIAGNOSIS — M5432 Sciatica, left side: Secondary | ICD-10-CM | POA: Diagnosis not present

## 2015-09-27 DIAGNOSIS — M545 Low back pain: Secondary | ICD-10-CM | POA: Diagnosis not present

## 2015-09-27 DIAGNOSIS — R2689 Other abnormalities of gait and mobility: Secondary | ICD-10-CM | POA: Diagnosis not present

## 2015-09-27 DIAGNOSIS — M5432 Sciatica, left side: Secondary | ICD-10-CM | POA: Diagnosis not present

## 2015-09-27 DIAGNOSIS — M6281 Muscle weakness (generalized): Secondary | ICD-10-CM | POA: Diagnosis not present

## 2015-10-19 DIAGNOSIS — D239 Other benign neoplasm of skin, unspecified: Secondary | ICD-10-CM | POA: Diagnosis not present

## 2015-10-19 DIAGNOSIS — L281 Prurigo nodularis: Secondary | ICD-10-CM | POA: Diagnosis not present

## 2015-10-19 DIAGNOSIS — L719 Rosacea, unspecified: Secondary | ICD-10-CM | POA: Diagnosis not present

## 2015-10-20 DIAGNOSIS — Z1231 Encounter for screening mammogram for malignant neoplasm of breast: Secondary | ICD-10-CM | POA: Diagnosis not present

## 2015-10-21 DIAGNOSIS — M5432 Sciatica, left side: Secondary | ICD-10-CM | POA: Diagnosis not present

## 2015-10-21 DIAGNOSIS — M6281 Muscle weakness (generalized): Secondary | ICD-10-CM | POA: Diagnosis not present

## 2015-10-21 DIAGNOSIS — M545 Low back pain: Secondary | ICD-10-CM | POA: Diagnosis not present

## 2015-10-21 DIAGNOSIS — R2689 Other abnormalities of gait and mobility: Secondary | ICD-10-CM | POA: Diagnosis not present

## 2015-11-01 DIAGNOSIS — M545 Low back pain: Secondary | ICD-10-CM | POA: Diagnosis not present

## 2015-11-01 DIAGNOSIS — M5432 Sciatica, left side: Secondary | ICD-10-CM | POA: Diagnosis not present

## 2015-11-01 DIAGNOSIS — R2689 Other abnormalities of gait and mobility: Secondary | ICD-10-CM | POA: Diagnosis not present

## 2015-11-01 DIAGNOSIS — M6281 Muscle weakness (generalized): Secondary | ICD-10-CM | POA: Diagnosis not present

## 2015-11-03 DIAGNOSIS — M5432 Sciatica, left side: Secondary | ICD-10-CM | POA: Diagnosis not present

## 2015-11-03 DIAGNOSIS — M6281 Muscle weakness (generalized): Secondary | ICD-10-CM | POA: Diagnosis not present

## 2015-11-03 DIAGNOSIS — M545 Low back pain: Secondary | ICD-10-CM | POA: Diagnosis not present

## 2015-11-03 DIAGNOSIS — R2689 Other abnormalities of gait and mobility: Secondary | ICD-10-CM | POA: Diagnosis not present

## 2015-11-04 DIAGNOSIS — M5432 Sciatica, left side: Secondary | ICD-10-CM | POA: Diagnosis not present

## 2015-11-04 DIAGNOSIS — R2689 Other abnormalities of gait and mobility: Secondary | ICD-10-CM | POA: Diagnosis not present

## 2015-11-04 DIAGNOSIS — M545 Low back pain: Secondary | ICD-10-CM | POA: Diagnosis not present

## 2015-11-04 DIAGNOSIS — M6281 Muscle weakness (generalized): Secondary | ICD-10-CM | POA: Diagnosis not present

## 2015-11-10 DIAGNOSIS — M6281 Muscle weakness (generalized): Secondary | ICD-10-CM | POA: Diagnosis not present

## 2015-11-10 DIAGNOSIS — R2689 Other abnormalities of gait and mobility: Secondary | ICD-10-CM | POA: Diagnosis not present

## 2015-11-10 DIAGNOSIS — M5432 Sciatica, left side: Secondary | ICD-10-CM | POA: Diagnosis not present

## 2015-11-10 DIAGNOSIS — M545 Low back pain: Secondary | ICD-10-CM | POA: Diagnosis not present

## 2015-11-17 DIAGNOSIS — J069 Acute upper respiratory infection, unspecified: Secondary | ICD-10-CM | POA: Diagnosis not present

## 2015-12-08 DIAGNOSIS — Z0000 Encounter for general adult medical examination without abnormal findings: Secondary | ICD-10-CM | POA: Diagnosis not present

## 2015-12-08 DIAGNOSIS — E039 Hypothyroidism, unspecified: Secondary | ICD-10-CM | POA: Diagnosis not present

## 2015-12-08 DIAGNOSIS — I471 Supraventricular tachycardia: Secondary | ICD-10-CM | POA: Diagnosis not present

## 2015-12-08 DIAGNOSIS — E782 Mixed hyperlipidemia: Secondary | ICD-10-CM | POA: Diagnosis not present

## 2015-12-08 DIAGNOSIS — F329 Major depressive disorder, single episode, unspecified: Secondary | ICD-10-CM | POA: Diagnosis not present

## 2015-12-08 DIAGNOSIS — K219 Gastro-esophageal reflux disease without esophagitis: Secondary | ICD-10-CM | POA: Diagnosis not present

## 2015-12-08 DIAGNOSIS — L959 Vasculitis limited to the skin, unspecified: Secondary | ICD-10-CM | POA: Diagnosis not present

## 2015-12-08 DIAGNOSIS — R5382 Chronic fatigue, unspecified: Secondary | ICD-10-CM | POA: Diagnosis not present

## 2015-12-08 DIAGNOSIS — J45998 Other asthma: Secondary | ICD-10-CM | POA: Diagnosis not present

## 2015-12-08 DIAGNOSIS — F411 Generalized anxiety disorder: Secondary | ICD-10-CM | POA: Diagnosis not present

## 2015-12-16 DIAGNOSIS — R2689 Other abnormalities of gait and mobility: Secondary | ICD-10-CM | POA: Diagnosis not present

## 2015-12-16 DIAGNOSIS — M5432 Sciatica, left side: Secondary | ICD-10-CM | POA: Diagnosis not present

## 2015-12-16 DIAGNOSIS — M545 Low back pain: Secondary | ICD-10-CM | POA: Diagnosis not present

## 2015-12-16 DIAGNOSIS — M6281 Muscle weakness (generalized): Secondary | ICD-10-CM | POA: Diagnosis not present

## 2015-12-27 DIAGNOSIS — M545 Low back pain: Secondary | ICD-10-CM | POA: Diagnosis not present

## 2015-12-27 DIAGNOSIS — R2689 Other abnormalities of gait and mobility: Secondary | ICD-10-CM | POA: Diagnosis not present

## 2015-12-27 DIAGNOSIS — D473 Essential (hemorrhagic) thrombocythemia: Secondary | ICD-10-CM | POA: Diagnosis not present

## 2015-12-27 DIAGNOSIS — M6281 Muscle weakness (generalized): Secondary | ICD-10-CM | POA: Diagnosis not present

## 2015-12-27 DIAGNOSIS — M5432 Sciatica, left side: Secondary | ICD-10-CM | POA: Diagnosis not present

## 2015-12-29 DIAGNOSIS — R2689 Other abnormalities of gait and mobility: Secondary | ICD-10-CM | POA: Diagnosis not present

## 2015-12-29 DIAGNOSIS — M5432 Sciatica, left side: Secondary | ICD-10-CM | POA: Diagnosis not present

## 2015-12-29 DIAGNOSIS — M545 Low back pain: Secondary | ICD-10-CM | POA: Diagnosis not present

## 2015-12-29 DIAGNOSIS — M6281 Muscle weakness (generalized): Secondary | ICD-10-CM | POA: Diagnosis not present

## 2016-01-03 DIAGNOSIS — R2689 Other abnormalities of gait and mobility: Secondary | ICD-10-CM | POA: Diagnosis not present

## 2016-01-03 DIAGNOSIS — M545 Low back pain: Secondary | ICD-10-CM | POA: Diagnosis not present

## 2016-01-03 DIAGNOSIS — M5432 Sciatica, left side: Secondary | ICD-10-CM | POA: Diagnosis not present

## 2016-01-03 DIAGNOSIS — Z4789 Encounter for other orthopedic aftercare: Secondary | ICD-10-CM | POA: Diagnosis not present

## 2016-01-03 DIAGNOSIS — M6281 Muscle weakness (generalized): Secondary | ICD-10-CM | POA: Diagnosis not present

## 2016-01-06 DIAGNOSIS — M1812 Unilateral primary osteoarthritis of first carpometacarpal joint, left hand: Secondary | ICD-10-CM | POA: Diagnosis not present

## 2016-01-18 DIAGNOSIS — M542 Cervicalgia: Secondary | ICD-10-CM | POA: Diagnosis not present

## 2016-01-24 DIAGNOSIS — Z4789 Encounter for other orthopedic aftercare: Secondary | ICD-10-CM | POA: Diagnosis not present

## 2016-01-24 DIAGNOSIS — M542 Cervicalgia: Secondary | ICD-10-CM | POA: Diagnosis not present

## 2016-02-01 DIAGNOSIS — M1812 Unilateral primary osteoarthritis of first carpometacarpal joint, left hand: Secondary | ICD-10-CM | POA: Diagnosis not present

## 2016-02-22 DIAGNOSIS — M310 Hypersensitivity angiitis: Secondary | ICD-10-CM | POA: Diagnosis not present

## 2016-02-22 DIAGNOSIS — M5412 Radiculopathy, cervical region: Secondary | ICD-10-CM | POA: Diagnosis not present

## 2016-02-22 DIAGNOSIS — Z79899 Other long term (current) drug therapy: Secondary | ICD-10-CM | POA: Diagnosis not present

## 2016-02-22 DIAGNOSIS — M2550 Pain in unspecified joint: Secondary | ICD-10-CM | POA: Diagnosis not present

## 2016-02-22 DIAGNOSIS — M5416 Radiculopathy, lumbar region: Secondary | ICD-10-CM | POA: Diagnosis not present

## 2016-02-22 DIAGNOSIS — M150 Primary generalized (osteo)arthritis: Secondary | ICD-10-CM | POA: Diagnosis not present

## 2016-03-14 DIAGNOSIS — E063 Autoimmune thyroiditis: Secondary | ICD-10-CM | POA: Diagnosis not present

## 2016-03-14 DIAGNOSIS — E039 Hypothyroidism, unspecified: Secondary | ICD-10-CM | POA: Diagnosis not present

## 2016-03-21 ENCOUNTER — Ambulatory Visit (INDEPENDENT_AMBULATORY_CARE_PROVIDER_SITE_OTHER): Payer: Medicare Other | Admitting: Cardiology

## 2016-03-21 ENCOUNTER — Encounter: Payer: Self-pay | Admitting: Cardiology

## 2016-03-21 VITALS — BP 122/78 | HR 87 | Ht 65.0 in | Wt 181.1 lb

## 2016-03-21 DIAGNOSIS — R0789 Other chest pain: Secondary | ICD-10-CM | POA: Diagnosis not present

## 2016-03-21 DIAGNOSIS — E785 Hyperlipidemia, unspecified: Secondary | ICD-10-CM | POA: Diagnosis not present

## 2016-03-21 DIAGNOSIS — I471 Supraventricular tachycardia: Secondary | ICD-10-CM

## 2016-03-21 DIAGNOSIS — I951 Orthostatic hypotension: Secondary | ICD-10-CM | POA: Diagnosis not present

## 2016-03-21 MED ORDER — NITROGLYCERIN 0.4 MG SL SUBL: 0.4000 mg | SUBLINGUAL_TABLET | SUBLINGUAL | Status: DC | PRN

## 2016-03-21 MED ORDER — ASPIRIN EC 81 MG PO TBEC: 81.0000 mg | DELAYED_RELEASE_TABLET | Freq: Every day | ORAL | Status: DC

## 2016-03-21 MED ORDER — ATORVASTATIN CALCIUM 10 MG PO TABS: 10.0000 mg | ORAL_TABLET | Freq: Every day | ORAL | Status: DC

## 2016-03-21 NOTE — Progress Notes (Signed)
[facility] [address] [place], [place]  [address] Phone: [phone] Fax:  [phone]  Date:  03/21/2016   ID:  [name], DOB 12-Jun-1956, MRN [id number]  PCP:  Tawanna Solo, MD   History of Present Illness: [name] is a 60 y.o. female with PAT (paroxysmal atrial tachycardia) who underwent echocardiogram and nuclear stress test on 07/11/10 both reassuring showing normal EF, no ischemia, mild diastolic dysfunction here for followup. This testing was performed secondary to burning/chest tightness and breathlessness. Possible GERD.she has had an evaluation by pulmonary medicine and was given Advair and this has helped considerably with her breathlessness. She was diagnosed with asthma.  She's also had paroxysmal atrial tachycardia for which beta blocker is being utilized and helpful.   Unfortunately her main concerns currently are her autoimmune condition, leukocytoclastic vasculitis. Her dermatologist has been prescribing her methotrexate. She is being monitored closely. This can make her skin very sensitive to the touch.  02/12/13 - this has been a stressful year for her. Her mother with Alzheimer's has been sick. She has moved. She found that she had a sister. Overall, no significant cardiac symptoms. She has gained some weight.   03/04/14 - Sister with stroke. Feeding tube. At one point she was concerned about her on primary prevention for stroke and she cannot take aspirin because of severe allergy. She has not been taking Plavix for primary prevention. I'm fine with this. She has lost a good amount of weight. Recent hammertoe surgery, bunion removal. Had a postoperative infection. Taking antibiotics.  02/2015 - Orthostatic, dizzy cut back on Toprol.  Sister died 12-07-2022, mother died as well. No chest pain, no shortness of breath, no syncope. She was having orthostatic dizziness.  03/21/16 C7 surgery disc, still grieving loss of sister, Dr. Rolena Infante. Left tingling arm. Chest  pain last weeks, no fluttering.neuropathy in feet. Hard to sleep. Atypical chest discomfort associated with arm pain and neck disease. Nonexertional. Discussed lipids. Discussed feet burning at rest.   Wt Readings from Last 3 Encounters:  03/21/16 181 lb 1.9 oz (82.155 kg)  03/22/15 174 lb 4 oz (79.039 kg)  07/10/14 160 lb (72.576 kg)     Past Medical History  Diagnosis Date  . Chronic fatigue syndrome     Dr. Sabra Heck  . High triglycerides   . Low HDL (under 40)   . Back pain   . Hypothyroidism     Dr. Lewis Shock 4/14  . Esophageal spasm     GERD Dr. Sabra Heck, Dr. Wynetta Emery; egd 2006 nl; UGI 3/13 Small HH, moderate GERD, nl motility; seen at [facility] ([place]), egd? neg, sone improvement on sucralfate susp  . Paroxysmal atrial tachycardia (HCC)     Dr. Marlou Porch  . Uterine carcinoma ([place])     Dr. Polly Cobia  . Pernicious anemia     Dr. Sabra Heck  . Leukocytoclastic vasculitis ([place])     Dr. Tonia Brooms  . Foot pain     Dr. Rushie Nyhan  . RLS (restless legs syndrome)     low ferritin Dr. Sabra Heck  . Cough     /SOB, PFT's nl, improved with Bronchodilation 8/11  . Reflux     ? delayed gastric emptying--GES nl 01/2012 (6% at 2hrs)    Past Surgical History  Procedure Laterality Date  . Bunionectomy    . Metatarsal osteotomy    . Hammer toe surgery    . US echocardiography      with Nuclear test, Dr. Marlou Porch,  Low risk  . Lung biopsy      L Lower lung pulm nodules, largest 4.20mm, low risk, repeat CT in 1 yr now following with pulm at [facility]; 9/13 Stable tiny lung nodules, no f/u needed  . Colonoscopy      scr, 12/2008 ([facility]) nl    Current Outpatient Prescriptions  Medication Sig Dispense Refill  . buPROPion (WELLBUTRIN XL) 150 MG 24 hr tablet Take 150 mg by mouth daily.    . Cholecalciferol (VITAMIN D3) 1000 units CAPS Take 2,000 capsules by mouth daily.    . Clindamycin Phos-Benzoyl Perox gel Apply 1 application topically at bedtime. (ROSACEA)    . dexlansoprazole (DEXILANT) 60 MG capsule Take 60 mg  by mouth daily.    . DULoxetine (CYMBALTA) 30 MG capsule Take 90 mg by mouth daily.     Marland Kitchen EPINEPHrine (EPIPEN 2-PAK) 0.3 mg/0.3 mL IJ SOAJ injection Inject 0.3 mg into the muscle as needed (ALLERGIC REACTIONS).    Marland Kitchen estradiol (ESTRACE) 0.1 MG/GM vaginal cream Place 1 Applicatorful vaginally at bedtime.    Marland Kitchen estradiol (ESTRACE) 2 MG tablet Take 2 mg by mouth daily.    . fluticasone-salmeterol (ADVAIR HFA) 115-21 MCG/ACT inhaler Inhale 2 puffs into the lungs 2 (two) times daily as needed ((ASTHMA)).     Marland Kitchen levothyroxine (SYNTHROID, LEVOTHROID) 150 MCG tablet Take 150 mcg by mouth daily before breakfast.    . meloxicam (MOBIC) 15 MG tablet Take 15 mg by mouth daily.    . metoprolol succinate (TOPROL-XL) 25 MG 24 hr tablet Take 1 tablet (25 mg total) by mouth daily. 30 tablet 11  . mirabegron ER (MYRBETRIQ) 50 MG TB24 tablet Take 50 mg by mouth daily.    . nitroGLYCERIN (NITROSTAT) 0.4 MG SL tablet Place 1 tablet (0.4 mg total) under the tongue every 5 (five) minutes as needed for chest pain. 25 tablet 3  . NON FORMULARY Clobestasol As directed    . pentosan polysulfate (ELMIRON) 100 MG capsule Take 100 mg by mouth 2 (two) times daily.     Marland Kitchen URELLE (URELLE/URISED) 81 MG TABS tablet Take 1-2 tablets by mouth every 6 (six) hours as needed for bladder spasms.    . vitamin B-12 (CYANOCOBALAMIN) 1000 MCG tablet Take 1,000 mcg by mouth daily.     No current facility-administered medications for this visit.    Allergies:    Allergies  Allergen Reactions  . Aspirin Anaphylaxis  . Ciprofloxacin Nausea And Vomiting  . Ambien [Zolpidem Tartrate]     Hallucinations  . Minocycline     Rash  . Shellfish Allergy     Rash  . Latex Rash    Social History:  The patient  reports that she has never smoked. She does not have any smokeless tobacco history on file. She reports that she does not drink alcohol or use illicit drugs.   No family history on file.  ROS:  Please see the history of present illness.    Denies any fevers, chills, orthopnea, PND   All other systems reviewed and negative.   PHYSICAL EXAM: VS:  BP 122/78 mmHg  Pulse 87  Ht 5\' 5"  (1.651 m)  Wt 181 lb 1.9 oz (82.155 kg)  BMI 30.14 kg/m2  SpO2 99% Well nourished, well developed, in no acute distress HEENT: normal, [place]/AT, EOMI Neck: no JVD, normal carotid upstroke, no bruit Cardiac:  normal S1, S2; RRR; no murmur Lungs:  clear to auscultation bilaterally, no wheezing, rhonchi or rales Abd: soft, nontender, no hepatomegaly,  no bruits Ext: no edema, foot in soft device Skin: warm and drygood capillary refill GU: deferred Neuro: no focal abnormalities noted, AAO x 3  EKG:  EKG was ordered today 03/21/16-sinus rhythm, sinus arrhythmia, heart rate 82, PAC noted. 03/22/15-normal rhythm, 82, nonspecific T-wave changes-03/04/14-sinus rhythm, 83, LVH, left axis deviation    Carotid Doppler 03/26/14 - 1-39% ICA stenosis. Mild.   LDL 133, HDL 30s  ASSESSMENT AND PLAN:  1. Paroxysmal atrial tachycardia-decreased metoprolol from 50 to 25 mg. Felt dizzy at one point. Orthostatic.  2. Family history of stroke-sister, unfortunately died. Continue with aggressive primary prevention strategies, diet, exercise. LDL was 33 most recent check. She tried once again aspirin 81 mg and was able to take this without difficulty. She discussed this with her allergist and I am proponent of this especially with her sister and father's history of stroke. No need for Plavix.  Continue exercise. She is limited however by her fibromyalgia and peripheral neuropathies. 3. Hyperlipidemia-LDL 133, atorvastatin 10 mg. We will try. If she is having any worsening side effects, she will let me know. In 2 months we will recheck a lipid panel and ALT. 4. Bilateral feet burning at night only-not with exertion. Sounds neuropathic. She will discuss this further with Dr. Sabra Heck. I do not think this is a circulatory issue. 5. Arm pain/cervical disc disease. Dr. Rolena Infante has been  monitoring. Prior neck surgery. 6. 1 year follow up.  Signed, Candee Furbish, MD [facility]  03/21/2016 9:29 AM

## 2016-03-21 NOTE — Patient Instructions (Addendum)
Medication Instructions:  Your physician has recommended you make the following change in your medication:  1.  START Atorvastatin 10 mg taking 1 tablet daily 2.  START Aspirin 81 mg taking 1 tablet daily  Labwork: 2 MONTHS:  FASTING LIPID & ALT  Testing/Procedures:   None ordered  Follow-Up: Your physician wants you to follow-up in: [place] will receive a reminder letter in the mail two months in advance. If you don't receive a letter, please call our office to schedule the follow-up appointment.   Any Other Special Instructions Will Be Listed Below (If Applicable).    If you need a refill on your cardiac medications before your next appointment, please call your pharmacy.

## 2016-03-28 DIAGNOSIS — M5090 Cervical disc disorder, unspecified, unspecified cervical region: Secondary | ICD-10-CM | POA: Diagnosis not present

## 2016-04-16 ENCOUNTER — Other Ambulatory Visit: Payer: Self-pay | Admitting: Cardiology

## 2016-04-18 DIAGNOSIS — M5136 Other intervertebral disc degeneration, lumbar region: Secondary | ICD-10-CM | POA: Diagnosis not present

## 2016-04-19 DIAGNOSIS — L309 Dermatitis, unspecified: Secondary | ICD-10-CM | POA: Diagnosis not present

## 2016-04-19 DIAGNOSIS — L281 Prurigo nodularis: Secondary | ICD-10-CM | POA: Diagnosis not present

## 2016-04-19 DIAGNOSIS — L710 Perioral dermatitis: Secondary | ICD-10-CM | POA: Diagnosis not present

## 2016-05-10 DIAGNOSIS — M50222 Other cervical disc displacement at C5-C6 level: Secondary | ICD-10-CM | POA: Diagnosis not present

## 2016-05-16 DIAGNOSIS — G959 Disease of spinal cord, unspecified: Secondary | ICD-10-CM | POA: Diagnosis not present

## 2016-05-16 DIAGNOSIS — M5442 Lumbago with sciatica, left side: Secondary | ICD-10-CM | POA: Diagnosis not present

## 2016-05-16 DIAGNOSIS — M542 Cervicalgia: Secondary | ICD-10-CM | POA: Diagnosis not present

## 2016-05-23 ENCOUNTER — Other Ambulatory Visit (INDEPENDENT_AMBULATORY_CARE_PROVIDER_SITE_OTHER): Payer: Medicare Other

## 2016-05-23 DIAGNOSIS — I4719 Other supraventricular tachycardia: Secondary | ICD-10-CM

## 2016-05-23 DIAGNOSIS — I471 Supraventricular tachycardia: Secondary | ICD-10-CM

## 2016-05-23 DIAGNOSIS — Z79899 Other long term (current) drug therapy: Secondary | ICD-10-CM | POA: Diagnosis not present

## 2016-05-23 DIAGNOSIS — I951 Orthostatic hypotension: Secondary | ICD-10-CM

## 2016-05-23 LAB — LIPID PANEL
CHOLESTEROL: 132 mg/dL (ref 125–200)
HDL: 46 mg/dL (ref >=46)
LDL CALC: 40 mg/dL (ref <130)
Total CHOL/HDL Ratio: 2.9 Ratio (ref <=5.0)
Triglycerides: 232 mg/dL — ABNORMAL HIGH (ref <150)
VLDL: 46 mg/dL — AB (ref <30)

## 2016-05-23 LAB — ALT: ALT: 12 U/L (ref 6–29)

## 2016-05-25 ENCOUNTER — Telehealth: Payer: Self-pay | Admitting: Cardiology

## 2016-05-25 DIAGNOSIS — M5442 Lumbago with sciatica, left side: Secondary | ICD-10-CM | POA: Diagnosis not present

## 2016-05-25 NOTE — Telephone Encounter (Signed)
Follow Up ° ° ° ° °Pt is returning call from earlier. Please call. °

## 2016-05-26 NOTE — Telephone Encounter (Signed)
Follow Up: ° ° ° °Returning your call from yesterday. °

## 2016-05-26 NOTE — Telephone Encounter (Signed)
Left message for pt that her results are available for review on MyChart.  Not changes are needed but she should continue to follow a low fat diet.  Requested she call back with any questions/concerns.

## 2016-06-05 DIAGNOSIS — M5442 Lumbago with sciatica, left side: Secondary | ICD-10-CM | POA: Diagnosis not present

## 2016-06-05 DIAGNOSIS — M5416 Radiculopathy, lumbar region: Secondary | ICD-10-CM | POA: Diagnosis not present

## 2016-06-05 DIAGNOSIS — M1288 Other specific arthropathies, not elsewhere classified, other specified site: Secondary | ICD-10-CM | POA: Diagnosis not present

## 2016-06-08 DIAGNOSIS — I471 Supraventricular tachycardia: Secondary | ICD-10-CM | POA: Diagnosis not present

## 2016-06-08 DIAGNOSIS — J45998 Other asthma: Secondary | ICD-10-CM | POA: Diagnosis not present

## 2016-06-08 DIAGNOSIS — F329 Major depressive disorder, single episode, unspecified: Secondary | ICD-10-CM | POA: Diagnosis not present

## 2016-06-08 DIAGNOSIS — Z23 Encounter for immunization: Secondary | ICD-10-CM | POA: Diagnosis not present

## 2016-06-08 DIAGNOSIS — F411 Generalized anxiety disorder: Secondary | ICD-10-CM | POA: Diagnosis not present

## 2016-06-08 DIAGNOSIS — R5382 Chronic fatigue, unspecified: Secondary | ICD-10-CM | POA: Diagnosis not present

## 2016-06-20 DIAGNOSIS — E559 Vitamin D deficiency, unspecified: Secondary | ICD-10-CM | POA: Diagnosis not present

## 2016-06-20 DIAGNOSIS — K5901 Slow transit constipation: Secondary | ICD-10-CM | POA: Diagnosis not present

## 2016-06-20 DIAGNOSIS — K219 Gastro-esophageal reflux disease without esophagitis: Secondary | ICD-10-CM | POA: Diagnosis not present

## 2016-06-20 DIAGNOSIS — Z79899 Other long term (current) drug therapy: Secondary | ICD-10-CM | POA: Diagnosis not present

## 2016-06-27 DIAGNOSIS — M542 Cervicalgia: Secondary | ICD-10-CM | POA: Diagnosis not present

## 2016-06-28 DIAGNOSIS — M47816 Spondylosis without myelopathy or radiculopathy, lumbar region: Secondary | ICD-10-CM | POA: Diagnosis not present

## 2016-07-17 DIAGNOSIS — M5416 Radiculopathy, lumbar region: Secondary | ICD-10-CM | POA: Diagnosis not present

## 2016-07-17 DIAGNOSIS — M542 Cervicalgia: Secondary | ICD-10-CM | POA: Diagnosis not present

## 2016-07-17 DIAGNOSIS — M1288 Other specific arthropathies, not elsewhere classified, other specified site: Secondary | ICD-10-CM | POA: Diagnosis not present

## 2016-07-25 DIAGNOSIS — M47816 Spondylosis without myelopathy or radiculopathy, lumbar region: Secondary | ICD-10-CM | POA: Diagnosis not present

## 2016-08-14 DIAGNOSIS — M1288 Other specific arthropathies, not elsewhere classified, other specified site: Secondary | ICD-10-CM | POA: Diagnosis not present

## 2016-08-14 DIAGNOSIS — M5090 Cervical disc disorder, unspecified, unspecified cervical region: Secondary | ICD-10-CM | POA: Diagnosis not present

## 2016-08-14 DIAGNOSIS — M5416 Radiculopathy, lumbar region: Secondary | ICD-10-CM | POA: Diagnosis not present

## 2016-08-14 DIAGNOSIS — M5442 Lumbago with sciatica, left side: Secondary | ICD-10-CM | POA: Diagnosis not present

## 2016-08-22 DIAGNOSIS — M5416 Radiculopathy, lumbar region: Secondary | ICD-10-CM | POA: Diagnosis not present

## 2016-08-22 DIAGNOSIS — Z79899 Other long term (current) drug therapy: Secondary | ICD-10-CM | POA: Diagnosis not present

## 2016-08-22 DIAGNOSIS — M5412 Radiculopathy, cervical region: Secondary | ICD-10-CM | POA: Diagnosis not present

## 2016-08-22 DIAGNOSIS — M150 Primary generalized (osteo)arthritis: Secondary | ICD-10-CM | POA: Diagnosis not present

## 2016-08-22 DIAGNOSIS — M310 Hypersensitivity angiitis: Secondary | ICD-10-CM | POA: Diagnosis not present

## 2016-08-22 DIAGNOSIS — M2550 Pain in unspecified joint: Secondary | ICD-10-CM | POA: Diagnosis not present

## 2016-08-25 DIAGNOSIS — M47818 Spondylosis without myelopathy or radiculopathy, sacral and sacrococcygeal region: Secondary | ICD-10-CM | POA: Diagnosis not present

## 2016-08-25 DIAGNOSIS — M47816 Spondylosis without myelopathy or radiculopathy, lumbar region: Secondary | ICD-10-CM | POA: Diagnosis not present

## 2016-08-30 ENCOUNTER — Other Ambulatory Visit: Payer: Self-pay | Admitting: Obstetrics & Gynecology

## 2016-08-30 DIAGNOSIS — Z1272 Encounter for screening for malignant neoplasm of vagina: Secondary | ICD-10-CM | POA: Diagnosis not present

## 2016-08-30 DIAGNOSIS — N951 Menopausal and female climacteric states: Secondary | ICD-10-CM | POA: Diagnosis not present

## 2016-08-30 DIAGNOSIS — Z131 Encounter for screening for diabetes mellitus: Secondary | ICD-10-CM | POA: Diagnosis not present

## 2016-08-31 DIAGNOSIS — M533 Sacrococcygeal disorders, not elsewhere classified: Secondary | ICD-10-CM | POA: Diagnosis not present

## 2016-08-31 LAB — CYTOLOGY - PAP

## 2016-09-06 DIAGNOSIS — R5382 Chronic fatigue, unspecified: Secondary | ICD-10-CM | POA: Diagnosis not present

## 2016-09-06 DIAGNOSIS — G8929 Other chronic pain: Secondary | ICD-10-CM | POA: Diagnosis not present

## 2016-09-06 DIAGNOSIS — M5416 Radiculopathy, lumbar region: Secondary | ICD-10-CM | POA: Diagnosis not present

## 2016-09-06 DIAGNOSIS — M5412 Radiculopathy, cervical region: Secondary | ICD-10-CM | POA: Diagnosis not present

## 2016-09-11 DIAGNOSIS — R5382 Chronic fatigue, unspecified: Secondary | ICD-10-CM | POA: Diagnosis not present

## 2016-09-11 DIAGNOSIS — M5416 Radiculopathy, lumbar region: Secondary | ICD-10-CM | POA: Diagnosis not present

## 2016-09-11 DIAGNOSIS — M5412 Radiculopathy, cervical region: Secondary | ICD-10-CM | POA: Diagnosis not present

## 2016-09-11 DIAGNOSIS — G8929 Other chronic pain: Secondary | ICD-10-CM | POA: Diagnosis not present

## 2016-09-13 DIAGNOSIS — M5412 Radiculopathy, cervical region: Secondary | ICD-10-CM | POA: Diagnosis not present

## 2016-09-13 DIAGNOSIS — G8929 Other chronic pain: Secondary | ICD-10-CM | POA: Diagnosis not present

## 2016-09-13 DIAGNOSIS — R5382 Chronic fatigue, unspecified: Secondary | ICD-10-CM | POA: Diagnosis not present

## 2016-09-13 DIAGNOSIS — M5416 Radiculopathy, lumbar region: Secondary | ICD-10-CM | POA: Diagnosis not present

## 2016-09-14 ENCOUNTER — Encounter: Payer: Self-pay | Admitting: Podiatry

## 2016-09-14 ENCOUNTER — Ambulatory Visit (INDEPENDENT_AMBULATORY_CARE_PROVIDER_SITE_OTHER): Payer: Medicare Other

## 2016-09-14 ENCOUNTER — Ambulatory Visit (INDEPENDENT_AMBULATORY_CARE_PROVIDER_SITE_OTHER): Payer: Medicare Other | Admitting: Podiatry

## 2016-09-14 DIAGNOSIS — M898X9 Other specified disorders of bone, unspecified site: Secondary | ICD-10-CM

## 2016-09-14 DIAGNOSIS — L600 Ingrowing nail: Secondary | ICD-10-CM

## 2016-09-14 DIAGNOSIS — M2021 Hallux rigidus, right foot: Secondary | ICD-10-CM

## 2016-09-14 NOTE — Patient Instructions (Signed)

## 2016-09-14 NOTE — Progress Notes (Signed)
Subjective:     Patient ID: [name], female   DOB: Dec 13, 1955, 60 y.o.   MRN: [id number]  HPI patient states she has a painful ingrown on her left big toe that she cannot take out herself and the big toe joint of her right is becoming increasingly sore and making it hard to walk with and she's tried anti-inflammatories and other treatment without relief   Review of Systems     Objective:   Physical Exam Neurovascular status intact muscle strength was adequate range of motion within normal limits with patient found to have significant loss of motion first MPJ left with crepitus and pain around the first MPJ and on the left medial border the hallux is incurvated sore when pressed and difficult to wear shoe gear    Assessment:     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 She wants this done and will reappoint for consult will also be removal of screw at the same time and for the left hallux I recommended correction and reviewed correction going over all possible complications with procedure and at this time infiltrated 60 [place] times like Marcaine mixture removing the medial border exposing matrix and applying phenol 3 applications 30 seconds followed by alcohol lavage and sterile dressing. Gave instructions on soaks and reappoint

## 2016-10-02 DIAGNOSIS — G8929 Other chronic pain: Secondary | ICD-10-CM | POA: Diagnosis not present

## 2016-10-02 DIAGNOSIS — M5416 Radiculopathy, lumbar region: Secondary | ICD-10-CM | POA: Diagnosis not present

## 2016-10-02 DIAGNOSIS — R5382 Chronic fatigue, unspecified: Secondary | ICD-10-CM | POA: Diagnosis not present

## 2016-10-02 DIAGNOSIS — M5412 Radiculopathy, cervical region: Secondary | ICD-10-CM | POA: Diagnosis not present

## 2016-10-04 DIAGNOSIS — R5382 Chronic fatigue, unspecified: Secondary | ICD-10-CM | POA: Diagnosis not present

## 2016-10-04 DIAGNOSIS — M5416 Radiculopathy, lumbar region: Secondary | ICD-10-CM | POA: Diagnosis not present

## 2016-10-04 DIAGNOSIS — M5412 Radiculopathy, cervical region: Secondary | ICD-10-CM | POA: Diagnosis not present

## 2016-10-04 DIAGNOSIS — G8929 Other chronic pain: Secondary | ICD-10-CM | POA: Diagnosis not present

## 2016-10-11 DIAGNOSIS — F419 Anxiety disorder, unspecified: Secondary | ICD-10-CM | POA: Diagnosis not present

## 2016-10-11 DIAGNOSIS — N951 Menopausal and female climacteric states: Secondary | ICD-10-CM | POA: Diagnosis not present

## 2016-10-16 ENCOUNTER — Telehealth: Payer: Self-pay | Admitting: Podiatry

## 2016-10-16 NOTE — Telephone Encounter (Signed)
Patient called saying she has hurt her hand and needs to cancel her surgery scheduled for 07 November 2016 with Dr. Paulla Dolly. She will call back at a later time to reschedule the surgery consult and the surgery. I have cancelled the surgery consult scheduled on 01 November 2016.

## 2016-10-20 DIAGNOSIS — Z1231 Encounter for screening mammogram for malignant neoplasm of breast: Secondary | ICD-10-CM | POA: Diagnosis not present

## 2016-10-25 ENCOUNTER — Other Ambulatory Visit: Payer: Self-pay | Admitting: Physician Assistant

## 2016-10-25 DIAGNOSIS — K5904 Chronic idiopathic constipation: Secondary | ICD-10-CM | POA: Diagnosis not present

## 2016-10-25 DIAGNOSIS — M545 Low back pain: Secondary | ICD-10-CM | POA: Diagnosis not present

## 2016-10-25 DIAGNOSIS — R1032 Left lower quadrant pain: Secondary | ICD-10-CM

## 2016-10-25 DIAGNOSIS — R197 Diarrhea, unspecified: Secondary | ICD-10-CM | POA: Diagnosis not present

## 2016-10-26 ENCOUNTER — Ambulatory Visit
Admission: RE | Admit: 2016-10-26 | Discharge: 2016-10-26 | Disposition: A | Discharge status: Home / Self Care | Payer: Medicare Other | Source: Ambulatory Visit | Attending: Physician Assistant | Admitting: Physician Assistant

## 2016-10-26 DIAGNOSIS — R1032 Left lower quadrant pain: Secondary | ICD-10-CM | POA: Diagnosis not present

## 2016-10-26 MED ORDER — IOPAMIDOL (ISOVUE-300) INJECTION 61%: 100.0000 mL | Freq: Once | INTRAVENOUS | Status: DC | PRN

## 2016-10-27 DIAGNOSIS — R351 Nocturia: Secondary | ICD-10-CM | POA: Diagnosis not present

## 2016-10-27 DIAGNOSIS — N3010 Interstitial cystitis (chronic) without hematuria: Secondary | ICD-10-CM | POA: Diagnosis not present

## 2016-10-31 DIAGNOSIS — M1812 Unilateral primary osteoarthritis of first carpometacarpal joint, left hand: Secondary | ICD-10-CM | POA: Diagnosis not present

## 2016-11-01 ENCOUNTER — Ambulatory Visit: Payer: Medicare Other | Admitting: Podiatry

## 2016-11-14 DIAGNOSIS — L509 Urticaria, unspecified: Secondary | ICD-10-CM | POA: Diagnosis not present

## 2016-11-14 DIAGNOSIS — M791 Myalgia: Secondary | ICD-10-CM | POA: Diagnosis not present

## 2016-11-14 DIAGNOSIS — M310 Hypersensitivity angiitis: Secondary | ICD-10-CM | POA: Diagnosis not present

## 2016-11-14 DIAGNOSIS — Z6830 Body mass index (BMI) 30.0-30.9, adult: Secondary | ICD-10-CM | POA: Diagnosis not present

## 2016-11-14 DIAGNOSIS — E669 Obesity, unspecified: Secondary | ICD-10-CM | POA: Diagnosis not present

## 2016-11-14 DIAGNOSIS — M2550 Pain in unspecified joint: Secondary | ICD-10-CM | POA: Diagnosis not present

## 2016-11-28 DIAGNOSIS — M318 Other specified necrotizing vasculopathies: Secondary | ICD-10-CM | POA: Diagnosis not present

## 2016-11-28 DIAGNOSIS — L719 Rosacea, unspecified: Secondary | ICD-10-CM | POA: Diagnosis not present

## 2016-12-04 DIAGNOSIS — N951 Menopausal and female climacteric states: Secondary | ICD-10-CM | POA: Diagnosis not present

## 2016-12-04 DIAGNOSIS — R5382 Chronic fatigue, unspecified: Secondary | ICD-10-CM | POA: Diagnosis not present

## 2016-12-12 ENCOUNTER — Encounter (HOSPITAL_COMMUNITY): Payer: Self-pay | Admitting: Emergency Medicine

## 2016-12-12 ENCOUNTER — Emergency Department (HOSPITAL_COMMUNITY)
Admission: EM | Admit: 2016-12-12 | Discharge: 2016-12-13 | Disposition: A | Discharge status: Home / Self Care | Payer: Medicare Other | Attending: Emergency Medicine | Admitting: Emergency Medicine

## 2016-12-12 DIAGNOSIS — R0602 Shortness of breath: Secondary | ICD-10-CM | POA: Insufficient documentation

## 2016-12-12 DIAGNOSIS — Z91040 Latex allergy status: Secondary | ICD-10-CM | POA: Insufficient documentation

## 2016-12-12 DIAGNOSIS — Z7982 Long term (current) use of aspirin: Secondary | ICD-10-CM | POA: Insufficient documentation

## 2016-12-12 DIAGNOSIS — E039 Hypothyroidism, unspecified: Secondary | ICD-10-CM | POA: Diagnosis not present

## 2016-12-12 DIAGNOSIS — T7801XA Anaphylactic reaction due to peanuts, initial encounter: Secondary | ICD-10-CM | POA: Diagnosis not present

## 2016-12-12 DIAGNOSIS — Z8542 Personal history of malignant neoplasm of other parts of uterus: Secondary | ICD-10-CM | POA: Diagnosis not present

## 2016-12-12 DIAGNOSIS — T782XXA Anaphylactic shock, unspecified, initial encounter: Secondary | ICD-10-CM

## 2016-12-12 DIAGNOSIS — T7840XA Allergy, unspecified, initial encounter: Secondary | ICD-10-CM | POA: Diagnosis present

## 2016-12-12 MED ORDER — METHYLPREDNISOLONE SODIUM SUCC 125 MG IJ SOLR
125.0000 mg | Freq: Once | INTRAMUSCULAR | Status: AC
  Administered 2016-12-12: 125 mg via INTRAVENOUS
  Filled 2016-12-12: qty 2

## 2016-12-12 MED ORDER — EPINEPHRINE 0.3 MG/0.3ML IJ SOAJ
0.3000 mg | Freq: Once | INTRAMUSCULAR | Status: AC
  Administered 2016-12-12: 0.3 mg via INTRAMUSCULAR
  Filled 2016-12-12: qty 0.3

## 2016-12-12 MED ORDER — ALBUTEROL SULFATE (2.5 MG/3ML) 0.083% IN NEBU
5.0000 mg | INHALATION_SOLUTION | Freq: Once | RESPIRATORY_TRACT | Status: AC
  Administered 2016-12-12: 5 mg via RESPIRATORY_TRACT
  Filled 2016-12-12: qty 6

## 2016-12-12 MED ORDER — DIPHENHYDRAMINE HCL 50 MG/ML IJ SOLN
50.0000 mg | Freq: Once | INTRAMUSCULAR | Status: AC
Start: 2016-12-12 — End: 2016-12-12
  Administered 2016-12-12: 50 mg via INTRAVENOUS
  Filled 2016-12-12: qty 1

## 2016-12-12 NOTE — ED Triage Notes (Signed)
Pt here for coughing and hoarse voice after eating peanuts today; pt denies prior hx of peanut allergy

## 2016-12-12 NOTE — ED Provider Notes (Signed)
[place] DEPT Provider Note   CSN: [id number] Arrival date & time: 12/12/16  1817     History   Chief Complaint Chief Complaint  Patient presents with  . Allergic Reaction    HPI Jill UEHARA is a 61 y.o. female.Patient developed shortness of breath and hoarseness as well as red rash, slightly pruritic 5:30 PM today. Other associated symptoms include cough. Symptoms started 30 minutes after eating peanuts. She treated self with Allegra and an EpiPen, symptoms of slightly worsened. No other associated symptoms.  HPI  Past Medical History:  Diagnosis Date  . Back pain   . Chronic fatigue syndrome    Dr. Sabra Heck  . Cough    /SOB, PFT's nl, improved with Bronchodilation 8/11  . Esophageal spasm    GERD Dr. Sabra Heck, Dr. Wynetta Emery; egd 2006 nl; UGI 3/13 Small HH, moderate GERD, nl motility; seen at [facility] ([place]), egd? neg, sone improvement on sucralfate susp  . Foot pain    Dr. Rushie Nyhan  . High triglycerides   . Hypothyroidism    Dr. Lewis Shock 4/14  . Leukocytoclastic vasculitis ([place])    Dr. Tonia Brooms  . Low HDL (under 40)   . Paroxysmal atrial tachycardia (HCC)    Dr. Marlou Porch  . Pernicious anemia    Dr. Sabra Heck  . Reflux    ? delayed gastric emptying--GES nl 01/2012 (6% at 2hrs)  . RLS (restless legs syndrome)    low ferritin Dr. Sabra Heck  . Uterine carcinoma ([place])    Dr. Polly Cobia  Rosacea Urticarial vasculitis  Patient Active Problem List   Diagnosis Date Noted  . Orthostatic hypotension 03/22/2015  . Paroxysmal atrial tachycardia [facility])     Past Surgical History:  Procedure Laterality Date  . BUNIONECTOMY    . COLONOSCOPY     scr, 12/2008 ([facility]) nl  . HAMMER TOE SURGERY    . LUNG BIOPSY     L Lower lung pulm nodules, largest 4.28mm, low risk, repeat CT in 1 yr now following with pulm at [facility]; 9/13 Stable tiny lung nodules, no f/u needed  . METATARSAL OSTEOTOMY    . US ECHOCARDIOGRAPHY     with Nuclear test, Dr. Marlou Porch, Low risk    OB History    No data  available       Home Medications    Prior to Admission medications   Medication Sig Start Date End Date Taking? Authorizing Provider  aspirin EC 81 MG tablet Take 1 tablet (81 mg total) by mouth daily. 03/21/16   Jerline Pain, MD  atorvastatin (LIPITOR) 10 MG tablet Take 1 tablet (10 mg total) by mouth daily. 03/21/16   Jerline Pain, MD  buPROPion (WELLBUTRIN XL) 150 MG 24 hr tablet Take 150 mg by mouth daily.    Historical Provider, MD  Cholecalciferol (VITAMIN D3) 1000 units CAPS Take 2,000 capsules by mouth daily.    Historical Provider, MD  Clindamycin Phos-Benzoyl Perox gel Apply 1 application topically at bedtime. (ROSACEA)    Historical Provider, MD  dexlansoprazole (DEXILANT) 60 MG capsule Take 60 mg by mouth daily.    Historical Provider, MD  DULoxetine (CYMBALTA) 30 MG capsule Take 90 mg by mouth daily.     Historical Provider, MD  EPINEPHrine (EPIPEN 2-PAK) 0.3 mg/0.3 mL IJ SOAJ injection Inject 0.3 mg into the muscle as needed (ALLERGIC REACTIONS).    Historical Provider, MD  estradiol (ESTRACE) 0.1 MG/GM vaginal cream Place 1 Applicatorful vaginally at bedtime.    Historical Provider, MD  estradiol (ESTRACE)  2 MG tablet Take 2 mg by mouth daily.    Historical Provider, MD  fluticasone-salmeterol (ADVAIR HFA) 115-21 MCG/ACT inhaler Inhale 2 puffs into the lungs 2 (two) times daily as needed ((ASTHMA)).     Historical Provider, MD  levothyroxine (SYNTHROID, LEVOTHROID) 150 MCG tablet Take 150 mcg by mouth daily before breakfast.    Historical Provider, MD  meloxicam (MOBIC) 15 MG tablet Take 15 mg by mouth daily.    Historical Provider, MD  metoprolol succinate (TOPROL-XL) 25 MG 24 hr tablet Take 1 tablet (25 mg total) by mouth daily. 04/17/16   Jerline Pain, MD  mirabegron ER (MYRBETRIQ) 50 MG TB24 tablet Take 50 mg by mouth daily.    Historical Provider, MD  nitroGLYCERIN (NITROSTAT) 0.4 MG SL tablet Place 1 tablet (0.4 mg total) under the tongue every 5 (five) minutes as needed  for chest pain. 03/21/16   Jerline Pain, MD  NON FORMULARY Clobestasol As directed    Historical Provider, MD  pentosan polysulfate (ELMIRON) 100 MG capsule Take 100 mg by mouth 2 (two) times daily.     Historical Provider, MD  URELLE (URELLE/URISED) 81 MG TABS tablet Take 1-2 tablets by mouth every 6 (six) hours as needed for bladder spasms.    Historical Provider, MD  vitamin B-12 (CYANOCOBALAMIN) 1000 MCG tablet Take 1,000 mcg by mouth daily.    Historical Provider, MD    Family History History reviewed. No pertinent family history.  Social History Social History  Substance Use Topics  . Smoking status: Never Smoker  . Smokeless tobacco: Not on file  . Alcohol use No     Allergies   Ciprofloxacin; Ambien [zolpidem tartrate]; Minocycline; Peanut-containing drug products; Shellfish allergy; and Latex   Review of Systems Review of Systems  Constitutional: Negative.   HENT: Positive for voice change.   Respiratory: Positive for cough and shortness of breath.   Cardiovascular: Negative.   Gastrointestinal: Negative.   Musculoskeletal: Negative.   Skin: Positive for rash.  Neurological: Negative.   Psychiatric/Behavioral: Negative.   All other systems reviewed and are negative.    Physical Exam Updated Vital Signs BP 148/81 (BP Location: Right Arm)   Pulse 107   Temp 98.5 F (36.9 C) (Oral)   Resp 18   SpO2 100%   Physical Exam  Constitutional: She appears well-developed and well-nourished.  HENT:  Head: Normocephalic and atraumatic.  Mouth/Throat: Oropharynx is clear and moist.  Slightly hoarse  Eyes: Conjunctivae are normal. Pupils are equal, round, and reactive to light.  Neck: Neck supple. No tracheal deviation present. No thyromegaly present.  Cardiovascular: Regular rhythm.   No murmur heard. Mildly tachycardic  Pulmonary/Chest: Effort normal.  Minimal and neck sclerae wheeze. Occasional cough  Abdominal: Soft. Bowel sounds are normal. She exhibits no  distension. There is no tenderness.  Musculoskeletal: Normal range of motion. She exhibits no edema or tenderness.  Neurological: She is alert. Coordination normal.  Skin: Skin is warm and dry. No rash noted.  Pinkish rash or red about throat no definite lesion  Psychiatric: She has a normal mood and affect.  Nursing note and vitals reviewed.    ED Treatments / Results  Labs (all labs ordered are listed, but only abnormal results are displayed) Labs Reviewed - No data to display  EKG  EKG Interpretation None       Radiology No results found.  Procedures Procedures (including critical care time)  Medications Ordered in ED Medications  methylPREDNISolone sodium succinate (SOLU-MEDROL) 125  mg/2 mL injection 125 mg (not administered)  EPINEPHrine (EPI-PEN) injection 0.3 mg (not administered)  diphenhydrAMINE (BENADRYL) injection 50 mg (not administered)  albuterol (PROVENTIL) (2.5 MG/3ML) 0.083% nebulizer solution 5 mg (not administered)     Initial Impression / Assessment and Plan / ED Course  I have reviewed the triage vital signs and the nursing notes.  Pertinent labs & imaging results that were available during my care of the patient were reviewed by me and considered in my medical decision making (see chart for details).   7:35 p.m. feels improved after treatment with intramuscular epinephrine, IV Solu-Medrol, nebulized albuterol and IV Benadryl.  11:55 PM patient is alert. Skin no longer reddened. Telling secretions well. Denies dyspnea. She feels "slight mucus in my throat" otherwise asymptomatic. She has no swelling of oropharynx. Voice has returned to normal. He feels ready to go home. She is not lightheaded on standing. Plan prescription EpiPen, prednisone, Benadryl 50 mg every hours as needed for itch.  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

## 2016-12-13 MED ORDER — EPINEPHRINE 0.3 MG/0.3ML IJ SOAJ: 0.3000 mg | Freq: Once | INTRAMUSCULAR | 0 refills | Status: AC

## 2016-12-13 MED ORDER — PREDNISONE 20 MG PO TABS: ORAL_TABLET | ORAL | 0 refills | Status: DC

## 2016-12-13 NOTE — Discharge Instructions (Signed)
Avoid peanuts until all of your doctors that you are allergic to peanuts. If your symptoms worsen, use the EpiPen immediately and return to the emergency Department immediately. Take prednisone as directed starting tomorrow morning. Take Benadryl 50 mg every 6 hours as needed for itch.

## 2016-12-18 DIAGNOSIS — E669 Obesity, unspecified: Secondary | ICD-10-CM | POA: Diagnosis not present

## 2016-12-18 DIAGNOSIS — R5382 Chronic fatigue, unspecified: Secondary | ICD-10-CM | POA: Diagnosis not present

## 2016-12-18 DIAGNOSIS — M310 Hypersensitivity angiitis: Secondary | ICD-10-CM | POA: Diagnosis not present

## 2016-12-18 DIAGNOSIS — M150 Primary generalized (osteo)arthritis: Secondary | ICD-10-CM | POA: Diagnosis not present

## 2016-12-18 DIAGNOSIS — Z91010 Allergy to peanuts: Secondary | ICD-10-CM | POA: Diagnosis not present

## 2016-12-18 DIAGNOSIS — Z6830 Body mass index (BMI) 30.0-30.9, adult: Secondary | ICD-10-CM | POA: Diagnosis not present

## 2016-12-20 DIAGNOSIS — G8918 Other acute postprocedural pain: Secondary | ICD-10-CM | POA: Diagnosis not present

## 2016-12-20 DIAGNOSIS — M1812 Unilateral primary osteoarthritis of first carpometacarpal joint, left hand: Secondary | ICD-10-CM | POA: Diagnosis not present

## 2017-01-02 DIAGNOSIS — Z4789 Encounter for other orthopedic aftercare: Secondary | ICD-10-CM | POA: Diagnosis not present

## 2017-01-02 DIAGNOSIS — M1812 Unilateral primary osteoarthritis of first carpometacarpal joint, left hand: Secondary | ICD-10-CM | POA: Diagnosis not present

## 2017-01-04 DIAGNOSIS — M310 Hypersensitivity angiitis: Secondary | ICD-10-CM | POA: Diagnosis not present

## 2017-01-08 DIAGNOSIS — R1032 Left lower quadrant pain: Secondary | ICD-10-CM | POA: Diagnosis not present

## 2017-01-08 DIAGNOSIS — K5904 Chronic idiopathic constipation: Secondary | ICD-10-CM | POA: Diagnosis not present

## 2017-01-09 DIAGNOSIS — Z4789 Encounter for other orthopedic aftercare: Secondary | ICD-10-CM | POA: Diagnosis not present

## 2017-01-09 DIAGNOSIS — M1812 Unilateral primary osteoarthritis of first carpometacarpal joint, left hand: Secondary | ICD-10-CM | POA: Diagnosis not present

## 2017-01-10 ENCOUNTER — Encounter (HOSPITAL_COMMUNITY): Payer: Self-pay | Admitting: Emergency Medicine

## 2017-01-10 ENCOUNTER — Emergency Department (HOSPITAL_COMMUNITY)
Admission: EM | Admit: 2017-01-10 | Discharge: 2017-01-10 | Disposition: A | Discharge status: Home / Self Care | Payer: Medicare Other | Attending: Emergency Medicine | Admitting: Emergency Medicine

## 2017-01-10 DIAGNOSIS — R21 Rash and other nonspecific skin eruption: Secondary | ICD-10-CM | POA: Diagnosis not present

## 2017-01-10 DIAGNOSIS — Z91040 Latex allergy status: Secondary | ICD-10-CM | POA: Insufficient documentation

## 2017-01-10 DIAGNOSIS — E039 Hypothyroidism, unspecified: Secondary | ICD-10-CM | POA: Insufficient documentation

## 2017-01-10 DIAGNOSIS — Z7982 Long term (current) use of aspirin: Secondary | ICD-10-CM | POA: Insufficient documentation

## 2017-01-10 DIAGNOSIS — Z8542 Personal history of malignant neoplasm of other parts of uterus: Secondary | ICD-10-CM | POA: Diagnosis not present

## 2017-01-10 DIAGNOSIS — Z91010 Allergy to peanuts: Secondary | ICD-10-CM | POA: Insufficient documentation

## 2017-01-10 MED ORDER — METHYLPREDNISOLONE SODIUM SUCC 125 MG IJ SOLR
125.0000 mg | Freq: Once | INTRAMUSCULAR | Status: AC
  Administered 2017-01-10: 125 mg via INTRAVENOUS
  Filled 2017-01-10: qty 2

## 2017-01-10 MED ORDER — FAMOTIDINE IN NACL 20-0.9 MG/50ML-% IV SOLN
20.0000 mg | Freq: Once | INTRAVENOUS | Status: AC
  Administered 2017-01-10: 20 mg via INTRAVENOUS
  Filled 2017-01-10: qty 50

## 2017-01-10 MED ORDER — DIPHENHYDRAMINE HCL 50 MG/ML IJ SOLN
50.0000 mg | Freq: Once | INTRAMUSCULAR | Status: AC
  Administered 2017-01-10: 50 mg via INTRAVENOUS
  Filled 2017-01-10: qty 1

## 2017-01-10 MED ORDER — PREDNISONE 20 MG PO TABS
60.0000 mg | ORAL_TABLET | Freq: Once | ORAL | Status: AC
  Administered 2017-01-10: 60 mg via ORAL
  Filled 2017-01-10: qty 3

## 2017-01-10 MED ORDER — PREDNISONE 10 MG (21) PO TBPK: ORAL_TABLET | Freq: Every day | ORAL | 0 refills | Status: DC

## 2017-01-10 MED ORDER — SODIUM CHLORIDE 0.9 % IV BOLUS (SEPSIS)
500.0000 mL | Freq: Once | INTRAVENOUS | Status: AC
  Administered 2017-01-10: 500 mL via INTRAVENOUS

## 2017-01-10 NOTE — ED Triage Notes (Signed)
Pt states she has a rash on neck and chest and throat with hoarseness and a feeling feeling in throat. Pt denies any sob or difficultly swallowing.

## 2017-01-10 NOTE — Discharge Instructions (Signed)
You were given benadryl, famotidine, prednisone and solumedrol in the ED.  You are being discharged with a prednisone taper, please take this as prescribed.  Continue taking benadryl and your zantac as recommended by your rheumatologist.  Monitor your symptoms and monitor for signs of airway compromise, return to the ED if your symptoms become concerning in any way.  Please call your rheumatologist and schedule an appointment for a re-evaluation within 2-3 days to ensure your symptoms are well controlled.  Follow up with your allergist/immunologist appointment as scheduled.

## 2017-01-10 NOTE — ED Notes (Signed)
Stats has vasculititis and now thinks it is affecting her vocal cords , she is hoarse, started when she work up this am, also has hive s all over urticaria , body and limbs

## 2017-01-10 NOTE — ED Provider Notes (Signed)
[name] Provider Note   CSN: [id number] Arrival date & time: 01/10/17  1019     History   Chief Complaint Chief Complaint  Patient presents with  . Rash  . Sore Throat    HPI [name] is a 61 y.o. female with pertinent pmh of urticarial vasculitis Presents to the emergency department reporting burning/pruritic/based erythematous diffuse rash all over her body consistent with her typical vasculitis rash associated with voice hoarseness. Patient states she was first diagnosed with vasculitis 30 years ago, it intermittently goes into remission and flares up. Patient states that January 2018 she developed her typical rash which is persisting. Before January 2018 patient states her last vasculitis flare was in 2014. Patient states that since January 2018 she has been dealing with this rash, has seen her rheumatologist (Dr. Judene Companion) who has prescribed her bursts of prednisone, Benadryl, Zantac, Solu-Medrol which sometimes help. Patient states that last week she called her rheumatologist for her worsening rash and was given a shot of Solu-Medrol and 3 days of prednisone which "did not do anything".  Patient states that last night she was unable to sleep due to her rash. Patient states that her rash has never caused her voice to be hoarse and was concerned which brought her to the emergency department. Patient has a history of peanut allergy, was seen in the ED 1 month ago with rash and hoarse voice. At that time patient was treated and discharged with improvement in her symptoms and rash.  Patient has an upcoming appointment with an allergist/immunologist 01/24/17.  HPI  Past Medical History:  Diagnosis Date  . Back pain   . Chronic fatigue syndrome    Dr. Sabra Heck  . Cough    /SOB, PFT's nl, improved with Bronchodilation 8/11  . Esophageal spasm    GERD Dr. Sabra Heck, Dr. Wynetta Emery; egd 2006 nl; UGI 3/13 Small HH, moderate GERD, nl motility; seen at [facility] ([place]), egd? neg, sone  improvement on sucralfate susp  . Foot pain    Dr. Rushie Nyhan  . High triglycerides   . Hypothyroidism    Dr. Lewis Shock 4/14  . Leukocytoclastic vasculitis ([place])    Dr. Tonia Brooms  . Low HDL (under 40)   . Paroxysmal atrial tachycardia (HCC)    Dr. Marlou Porch  . Pernicious anemia    Dr. Sabra Heck  . Reflux    ? delayed gastric emptying--GES nl 01/2012 (6% at 2hrs)  . RLS (restless legs syndrome)    low ferritin Dr. Sabra Heck  . Uterine carcinoma ([place])    Dr. Polly Cobia    Patient Active Problem List   Diagnosis Date Noted  . Orthostatic hypotension 03/22/2015  . Paroxysmal atrial tachycardia [facility])     Past Surgical History:  Procedure Laterality Date  . BUNIONECTOMY    . COLONOSCOPY     scr, 12/2008 ([facility]) nl  . HAMMER TOE SURGERY    . LUNG BIOPSY     L Lower lung pulm nodules, largest 4.40mm, low risk, repeat CT in 1 yr now following with pulm at [facility]; 9/13 Stable tiny lung nodules, no f/u needed  . METATARSAL OSTEOTOMY    . US ECHOCARDIOGRAPHY     with Nuclear test, Dr. Marlou Porch, Low risk    OB History    No data available       Home Medications    Prior to Admission medications   Medication Sig Start Date End Date Taking? Authorizing Provider  ALPRAZolam Duanne Moron) 1 MG tablet Take 0.5 mg by  mouth daily.    Yes Historical Provider, MD  aspirin EC 81 MG tablet Take 1 tablet (81 mg total) by mouth daily. 03/21/16  Yes Jerline Pain, MD  atorvastatin (LIPITOR) 10 MG tablet Take 1 tablet (10 mg total) by mouth daily. 03/21/16  Yes Jerline Pain, MD  Cholecalciferol (VITAMIN D3) 1000 units CAPS Take 2,000 Units by mouth daily.    Yes Historical Provider, MD  dexlansoprazole (DEXILANT) 60 MG capsule Take 60 mg by mouth daily.   Yes Historical Provider, MD  DULoxetine (CYMBALTA) 30 MG capsule Take 90 mg by mouth daily.    Yes Historical Provider, MD  fluticasone-salmeterol (ADVAIR HFA) 115-21 MCG/ACT inhaler Inhale 2 puffs into the lungs 2 (two) times daily as needed ((ASTHMA)).    Yes  Historical Provider, MD  gabapentin (NEURONTIN) 300 MG capsule Take 300 mg by mouth at bedtime.   Yes Historical Provider, MD  levothyroxine (SYNTHROID, LEVOTHROID) 150 MCG tablet Take 150 mcg by mouth daily before breakfast.   Yes Historical Provider, MD  linaclotide (LINZESS) 145 MCG CAPS capsule Take 145 mcg by mouth daily before breakfast.   Yes Historical Provider, MD  meloxicam (MOBIC) 15 MG tablet Take 15 mg by mouth daily.   Yes Historical Provider, MD  metoprolol succinate (TOPROL-XL) 25 MG 24 hr tablet Take 1 tablet (25 mg total) by mouth daily. 04/17/16  Yes Jerline Pain, MD  mirabegron ER (MYRBETRIQ) 50 MG TB24 tablet Take 50 mg by mouth daily.   Yes Historical Provider, MD  nitroGLYCERIN (NITROSTAT) 0.4 MG SL tablet Place 1 tablet (0.4 mg total) under the tongue every 5 (five) minutes as needed for chest pain. 03/21/16  Yes Jerline Pain, MD  pentosan polysulfate (ELMIRON) 100 MG capsule Take 100 mg by mouth 2 (two) times daily.    Yes Historical Provider, MD  venlafaxine XR (EFFEXOR-XR) 75 MG 24 hr capsule Take 150 mg by mouth daily with breakfast.   Yes Historical Provider, MD  vitamin B-12 (CYANOCOBALAMIN) 1000 MCG tablet Take 1,000 mcg by mouth daily.   Yes Historical Provider, MD  predniSONE (STERAPRED UNI-PAK 21 TAB) 10 MG (21) TBPK tablet Take by mouth daily. Take 6 tabs by mouth daily  for 2 days, then 5 tabs for 2 days, then 4 tabs for 2 days, then 3 tabs for 2 days, 2 tabs for 2 days, then 1 tab by mouth daily for 2 days 01/10/17   Kinnie Feil, PA-C    Family History No family history on file.  Social History Social History  Substance Use Topics  . Smoking status: Never Smoker  . Smokeless tobacco: Not on file  . Alcohol use No     Allergies   Ambien [zolpidem tartrate]; Minocycline; Peanut-containing drug products; Ciprofloxacin; Latex; and Shellfish allergy   Review of Systems Review of Systems  Constitutional: Negative for appetite change, chills and  fever.  HENT: Positive for voice change. Negative for congestion and sore throat.   Eyes: Negative for visual disturbance.  Respiratory: Negative for cough, choking, chest tightness and shortness of breath.   Cardiovascular: Negative for chest pain and palpitations.  Gastrointestinal: Negative for abdominal pain, constipation, diarrhea, nausea and vomiting.  Genitourinary: Negative for difficulty urinating and hematuria.  Musculoskeletal: Negative for arthralgias and myalgias.  Skin: Positive for rash. Negative for wound.  Neurological: Negative for dizziness, seizures, syncope, weakness, light-headedness, numbness and headaches.  Hematological: Does not bruise/bleed easily.  Psychiatric/Behavioral: Negative.      Physical Exam Updated  Vital Signs BP 133/68 (BP Location: Left Arm)   Pulse 77   Temp 98.8 F (37.1 C) (Oral)   Resp 16   Ht 5\' 5"  (1.651 m)   Wt 82.1 kg   SpO2 100%   BMI 30.12 kg/m   Physical Exam  Constitutional: She is oriented to person, place, and time. She appears well-developed and well-nourished.  HENT:  Head: Normocephalic and atraumatic.  Nose: Nose normal.  Mouth/Throat: Oropharynx is clear and moist. No oropharyngeal exudate.  No perioral or oral lesions.  Oral airway is widely patient.    Eyes: Conjunctivae and EOM are normal. Pupils are equal, round, and reactive to light.  Neck: Normal range of motion. Neck supple. No JVD present.  Cardiovascular: Normal rate, regular rhythm, normal heart sounds and intact distal pulses.   No murmur heard. Pulmonary/Chest: Effort normal and breath sounds normal. No respiratory distress. She has no wheezes. She has no rales. She exhibits no tenderness.  Patient speaking in full sentences without signs of respiratory distress.   Abdominal: Soft. Bowel sounds are normal. There is no tenderness.  Musculoskeletal: Normal range of motion. She exhibits no deformity.  Lymphadenopathy:    She has no cervical adenopathy.    Neurological: She is alert and oriented to person, place, and time. No sensory deficit.  Skin: Skin is warm and dry. Capillary refill takes less than 2 seconds. Rash noted.  Blanchable, tender, raised and erythematous rash diffusely over anterior/posterior neck, trunk, bilateral upper and lower extremities. Rash does not affect palms of the hands or soles of the feet.  Psychiatric: She has a normal mood and affect. Her behavior is normal. Judgment and thought content normal.  Nursing note and vitals reviewed.    ED Treatments / Results  Labs (all labs ordered are listed, but only abnormal results are displayed) Labs Reviewed - No data to display  EKG  EKG Interpretation None       Radiology No results found.  Procedures Procedures (including critical care time)  Medications Ordered in ED Medications  sodium chloride 0.9 % bolus 500 mL (0 mLs Intravenous Stopped 01/10/17 1505)  diphenhydrAMINE (BENADRYL) injection 50 mg (50 mg Intravenous Given 01/10/17 1249)  predniSONE (DELTASONE) tablet 60 mg (60 mg Oral Given 01/10/17 1249)  famotidine (PEPCID) IVPB 20 mg premix (0 mg Intravenous Stopped 01/10/17 1505)  methylPREDNISolone sodium succinate (SOLU-MEDROL) 125 mg/2 mL injection 125 mg (125 mg Intravenous Given 01/10/17 1442)     Initial Impression / Assessment and Plan / ED Course  I have reviewed the triage vital signs and the nursing notes.  Pertinent labs & imaging results that were available during my care of the patient were reviewed by me and considered in my medical decision making (see chart for details).    61 year old female with history of urticarial vasculitis presents with typical flare of her vasculitis rash. She is closely followed by Dr. Oran Rein, rheumatologist.  It appears the patient has been struggling to control her rash since recent flare on January 2018. She has received oral and IV steroids by her rheumatologist in clinic which occasionally help. She is  taking daily Zantac and Benadryl for her symptoms. Patient has upcoming appointment with allergist/immunologist for further workup of her vasculitis rash. Patient requesting medications to help her with her rash, states she has not been able to sleep, states that getting rid of her rash and symptoms is her priority today. Patient's voice is slightly hoarse, difficult to assess however her oral airway is  widely patent and patient is not having any issues breathing. She denies throat swelling or difficulty breathing or talking. She tolerated oral fluids and food in the ED. I personally called Dr. Oran Rein, her rheumatologist, to discuss ED treatment. Dr. Oran Rein advised Benadryl and oral prednisone taper. Dr. Oran Rein advised that patient's rash is atypical vasculitis, resembling urticaria more than a typical vasculitis rash. Dr. Oran Rein reinforced the importance of follow-up with allergist/immunologist for further workup. I communicated this with the patient and his spouse at bedside. Discussed plan to discharge with Benadryl in prednisone taper, however patient states she would like IV steroids. I think this is reasonable. Patient was given IV fluids, Benadryl and Solu-Medrol in the ED and was discharged with prednisone taper. Advised patient to follow up with appointment with allergist. She will follow-up with her rheumatologist in 2-3 days to ensure her symptoms are tolerable. Patient and spouse are agreeable to discharge.  Patient, ED treatment and discharge plan was discussed with supervising physician who is agreeable with plan.   Final Clinical Impressions(s) / ED Diagnoses   Final diagnoses:  Rash    New Prescriptions New Prescriptions   PREDNISONE (STERAPRED UNI-PAK 21 TAB) 10 MG (21) TBPK TABLET    Take by mouth daily. Take 6 tabs by mouth daily  for 2 days, then 5 tabs for 2 days, then 4 tabs for 2 days, then 3 tabs for 2 days, 2 tabs for 2 days, then 1 tab by mouth daily for 2 days     Kinnie Feil,  PA-C 01/10/17 [place], MD 01/10/17 [phone]

## 2017-01-10 NOTE — ED Notes (Signed)
Pt able to ambulate to restroom ?

## 2017-01-24 DIAGNOSIS — J3081 Allergic rhinitis due to animal (cat) (dog) hair and dander: Secondary | ICD-10-CM | POA: Diagnosis not present

## 2017-01-24 DIAGNOSIS — J4540 Moderate persistent asthma, uncomplicated: Secondary | ICD-10-CM | POA: Diagnosis not present

## 2017-01-24 DIAGNOSIS — J3089 Other allergic rhinitis: Secondary | ICD-10-CM | POA: Diagnosis not present

## 2017-01-24 DIAGNOSIS — Z91010 Allergy to peanuts: Secondary | ICD-10-CM | POA: Diagnosis not present

## 2017-01-25 DIAGNOSIS — Z1159 Encounter for screening for other viral diseases: Secondary | ICD-10-CM | POA: Diagnosis not present

## 2017-01-25 DIAGNOSIS — Z5181 Encounter for therapeutic drug level monitoring: Secondary | ICD-10-CM | POA: Diagnosis not present

## 2017-01-25 DIAGNOSIS — M318 Other specified necrotizing vasculopathies: Secondary | ICD-10-CM | POA: Diagnosis not present

## 2017-01-25 DIAGNOSIS — Z79899 Other long term (current) drug therapy: Secondary | ICD-10-CM | POA: Diagnosis not present

## 2017-01-26 DIAGNOSIS — Z91010 Allergy to peanuts: Secondary | ICD-10-CM | POA: Diagnosis not present

## 2017-01-30 DIAGNOSIS — M1812 Unilateral primary osteoarthritis of first carpometacarpal joint, left hand: Secondary | ICD-10-CM | POA: Diagnosis not present

## 2017-01-30 DIAGNOSIS — Z4789 Encounter for other orthopedic aftercare: Secondary | ICD-10-CM | POA: Diagnosis not present

## 2017-01-31 DIAGNOSIS — E669 Obesity, unspecified: Secondary | ICD-10-CM | POA: Diagnosis not present

## 2017-01-31 DIAGNOSIS — Z0000 Encounter for general adult medical examination without abnormal findings: Secondary | ICD-10-CM | POA: Diagnosis not present

## 2017-01-31 DIAGNOSIS — E782 Mixed hyperlipidemia: Secondary | ICD-10-CM | POA: Diagnosis not present

## 2017-01-31 DIAGNOSIS — I471 Supraventricular tachycardia: Secondary | ICD-10-CM | POA: Diagnosis not present

## 2017-01-31 DIAGNOSIS — N951 Menopausal and female climacteric states: Secondary | ICD-10-CM | POA: Diagnosis not present

## 2017-01-31 DIAGNOSIS — I776 Arteritis, unspecified: Secondary | ICD-10-CM | POA: Diagnosis not present

## 2017-01-31 DIAGNOSIS — J45998 Other asthma: Secondary | ICD-10-CM | POA: Diagnosis not present

## 2017-01-31 DIAGNOSIS — Z6830 Body mass index (BMI) 30.0-30.9, adult: Secondary | ICD-10-CM | POA: Diagnosis not present

## 2017-01-31 DIAGNOSIS — F329 Major depressive disorder, single episode, unspecified: Secondary | ICD-10-CM | POA: Diagnosis not present

## 2017-01-31 DIAGNOSIS — K219 Gastro-esophageal reflux disease without esophagitis: Secondary | ICD-10-CM | POA: Diagnosis not present

## 2017-01-31 DIAGNOSIS — E039 Hypothyroidism, unspecified: Secondary | ICD-10-CM | POA: Diagnosis not present

## 2017-02-06 DIAGNOSIS — M318 Other specified necrotizing vasculopathies: Secondary | ICD-10-CM | POA: Diagnosis not present

## 2017-02-06 DIAGNOSIS — Z79899 Other long term (current) drug therapy: Secondary | ICD-10-CM | POA: Diagnosis not present

## 2017-02-08 DIAGNOSIS — M1812 Unilateral primary osteoarthritis of first carpometacarpal joint, left hand: Secondary | ICD-10-CM | POA: Diagnosis not present

## 2017-02-12 DIAGNOSIS — M1812 Unilateral primary osteoarthritis of first carpometacarpal joint, left hand: Secondary | ICD-10-CM | POA: Diagnosis not present

## 2017-02-15 DIAGNOSIS — M1812 Unilateral primary osteoarthritis of first carpometacarpal joint, left hand: Secondary | ICD-10-CM | POA: Diagnosis not present

## 2017-02-28 DIAGNOSIS — L959 Vasculitis limited to the skin, unspecified: Secondary | ICD-10-CM | POA: Diagnosis present

## 2017-02-28 DIAGNOSIS — N951 Menopausal and female climacteric states: Secondary | ICD-10-CM | POA: Diagnosis not present

## 2017-02-28 DIAGNOSIS — N3010 Interstitial cystitis (chronic) without hematuria: Secondary | ICD-10-CM | POA: Insufficient documentation

## 2017-03-01 DIAGNOSIS — Z4789 Encounter for other orthopedic aftercare: Secondary | ICD-10-CM | POA: Diagnosis not present

## 2017-03-01 DIAGNOSIS — M1812 Unilateral primary osteoarthritis of first carpometacarpal joint, left hand: Secondary | ICD-10-CM | POA: Diagnosis not present

## 2017-03-14 DIAGNOSIS — E063 Autoimmune thyroiditis: Secondary | ICD-10-CM | POA: Diagnosis not present

## 2017-03-14 DIAGNOSIS — E039 Hypothyroidism, unspecified: Secondary | ICD-10-CM | POA: Diagnosis not present

## 2017-03-16 DIAGNOSIS — Z79899 Other long term (current) drug therapy: Secondary | ICD-10-CM | POA: Diagnosis not present

## 2017-03-16 DIAGNOSIS — M318 Other specified necrotizing vasculopathies: Secondary | ICD-10-CM | POA: Diagnosis not present

## 2017-03-27 DIAGNOSIS — M25521 Pain in right elbow: Secondary | ICD-10-CM | POA: Diagnosis not present

## 2017-03-27 DIAGNOSIS — M318 Other specified necrotizing vasculopathies: Secondary | ICD-10-CM | POA: Diagnosis not present

## 2017-03-27 DIAGNOSIS — M25522 Pain in left elbow: Secondary | ICD-10-CM | POA: Diagnosis not present

## 2017-03-27 DIAGNOSIS — Z5181 Encounter for therapeutic drug level monitoring: Secondary | ICD-10-CM | POA: Diagnosis not present

## 2017-03-29 ENCOUNTER — Ambulatory Visit (INDEPENDENT_AMBULATORY_CARE_PROVIDER_SITE_OTHER): Payer: Medicare Other | Admitting: Cardiology

## 2017-03-29 ENCOUNTER — Encounter: Payer: Self-pay | Admitting: Cardiology

## 2017-03-29 VITALS — BP 116/66 | HR 75 | Ht 65.0 in | Wt 181.6 lb

## 2017-03-29 DIAGNOSIS — L958 Other vasculitis limited to the skin: Secondary | ICD-10-CM

## 2017-03-29 DIAGNOSIS — E7800 Pure hypercholesterolemia, unspecified: Secondary | ICD-10-CM | POA: Diagnosis not present

## 2017-03-29 DIAGNOSIS — I471 Supraventricular tachycardia: Secondary | ICD-10-CM | POA: Diagnosis not present

## 2017-03-29 DIAGNOSIS — I4719 Other supraventricular tachycardia: Secondary | ICD-10-CM

## 2017-03-29 DIAGNOSIS — M318 Other specified necrotizing vasculopathies: Secondary | ICD-10-CM | POA: Diagnosis not present

## 2017-03-29 NOTE — Progress Notes (Signed)
[name]. [address], [address] [place], [place]  [address] Phone: [phone] Fax:  [phone]  Date:  03/29/2017   ID:  [name], DOB May 08, 1956, MRN [id number]  PCP:  Kathyrn Lass, MD   History of Present Illness: [name] is a 61 y.o. female with PAT (paroxysmal atrial tachycardia) who underwent echocardiogram and nuclear stress test on 07/11/10 both reassuring showing normal EF, no ischemia, mild diastolic dysfunction here for followup. This testing was performed secondary to burning/chest tightness and breathlessness. Possible GERD.she has had an evaluation by pulmonary medicine and was given Advair and this has helped considerably with her breathlessness. She was diagnosed with asthma.  She's also had paroxysmal atrial tachycardia for which beta blocker is being utilized and helpful.   Unfortunately her main concerns currently are her autoimmune condition, leukocytoclastic vasculitis. Her dermatologist has been prescribing her methotrexate. She is being monitored closely. This can make her skin very sensitive to the touch.  02/12/13 - this has been a stressful year for her. Her mother with Alzheimer's has been sick. She has moved. She found that she had a sister. Overall, no significant cardiac symptoms. She has gained some weight.   03/04/14 - Sister with stroke. Feeding tube. At one point she was concerned about her on primary prevention for stroke and she cannot take aspirin because of severe allergy. She has not been taking Plavix for primary prevention. I'm fine with this. She has lost a good amount of weight. Recent hammertoe surgery, bunion removal. Had a postoperative infection. Taking antibiotics.  02/2015 - Orthostatic, dizzy cut back on Toprol.  Sister died 11/19/2022, mother died as well. No chest pain, no shortness of breath, no syncope. She was having orthostatic dizziness.  03/21/16 C7 surgery disc, still grieving loss of sister, Dr. Rolena Infante. Left tingling arm. Chest  pain last weeks, no fluttering.neuropathy in feet. Hard to sleep. Atypical chest discomfort associated with arm pain and neck disease. Nonexertional. Discussed lipids. Discussed feet burning at rest.  03/29/17 - heart doing well, no CP, no SOB. Rare palpitations. Vasculitis came out of remission. Rash is quite severe, painful.   Wt Readings from Last 3 Encounters:  03/29/17 181 lb 9.6 oz (82.4 kg)  01/10/17 181 lb (82.1 kg)  03/21/16 181 lb 1.9 oz (82.2 kg)     Past Medical History:  Diagnosis Date  . Back pain   . Chronic fatigue syndrome    Dr. Sabra Heck  . Cough    /SOB, PFT's nl, improved with Bronchodilation 8/11  . Esophageal spasm    GERD Dr. Sabra Heck, Dr. Wynetta Emery; egd 2006 nl; UGI 3/13 Small HH, moderate GERD, nl motility; seen at [facility] ([place]), egd? neg, sone improvement on sucralfate susp  . Foot pain    Dr. Rushie Nyhan  . High triglycerides   . Hypothyroidism    Dr. Lewis Shock 4/14  . Leukocytoclastic vasculitis ([place])    Dr. Tonia Brooms  . Low HDL (under 40)   . Paroxysmal atrial tachycardia (HCC)    Dr. Marlou Porch  . Pernicious anemia    Dr. Sabra Heck  . Reflux    ? delayed gastric emptying--GES nl 01/2012 (6% at 2hrs)  . RLS (restless legs syndrome)    low ferritin Dr. Sabra Heck  . Uterine carcinoma (HCC)    Dr. Polly Cobia    Past Surgical History:  Procedure Laterality Date  . BUNIONECTOMY    . COLONOSCOPY     scr, 12/2008 ([facility]) nl  . HAMMER TOE SURGERY    .  LUNG BIOPSY     L Lower lung pulm nodules, largest 4.80mm, low risk, repeat CT in 1 yr now following with pulm at [facility]; 9/13 Stable tiny lung nodules, no f/u needed  . METATARSAL OSTEOTOMY    . US ECHOCARDIOGRAPHY     with Nuclear test, Dr. Marlou Porch, Low risk    Current Outpatient Prescriptions  Medication Sig Dispense Refill  . albuterol (PROVENTIL HFA;VENTOLIN HFA) 108 (90 Base) MCG/ACT inhaler Inhale 2 puffs into the lungs every 4 (four) hours as needed for wheezing or shortness of breath.    . ALPRAZolam (XANAX) 1 MG  tablet Take 0.5 mg by mouth daily.     Marland Kitchen aspirin EC 81 MG tablet Take 1 tablet (81 mg total) by mouth daily. 90 tablet 3  . atorvastatin (LIPITOR) 10 MG tablet Take 1 tablet (10 mg total) by mouth daily. 90 tablet 3  . Bioflavonoid Products (PERIDIN-C PO) Take 200 mg by mouth daily.    Marland Kitchen buPROPion (ZYBAN) 150 MG 12 hr tablet Take 450 mg by mouth every morning.    . Cholecalciferol (VITAMIN D3) 1000 units CAPS Take 2,000 Units by mouth daily.     . clindamycin (CLINDAGEL) 1 % gel Apply 1 application topically daily.    Marland Kitchen dexlansoprazole (DEXILANT) 60 MG capsule Take 60 mg by mouth daily.    . Digestive Enzymes (ENZYMATIC DIGESTANT PO) Take 1 tablet by mouth 2 (two) times daily.    . DULoxetine (CYMBALTA) 30 MG capsule Take 90 mg by mouth daily.     Marland Kitchen EPINEPHrine 0.3 mg/0.3 mL IJ SOAJ injection Inject 0.3 mg into the muscle once.    . fexofenadine (ALLEGRA) 180 MG tablet Take 180 mg by mouth daily.    . fluticasone-salmeterol (ADVAIR HFA) 115-21 MCG/ACT inhaler Inhale 2 puffs into the lungs 2 (two) times daily as needed ((ASTHMA)).     . folic acid (FOLVITE) 1 MG tablet Take 1 mg by mouth daily.    Marland Kitchen gabapentin (NEURONTIN) 300 MG capsule Take 300 mg by mouth at bedtime.    . hydroxychloroquine (PLAQUENIL) 200 MG tablet Take 200 mg by mouth 2 (two) times daily.    Marland Kitchen levothyroxine (SYNTHROID, LEVOTHROID) 150 MCG tablet Take 150 mcg by mouth daily before breakfast.    . linaclotide (LINZESS) 145 MCG CAPS capsule Take 145 mcg by mouth daily before breakfast.    . meloxicam (MOBIC) 15 MG tablet Take 15 mg by mouth daily.    . Methotrexate, PF, 25 MG/0.5ML SOAJ Inject 1 mL into the skin once a week.    . metoprolol succinate (TOPROL-XL) 25 MG 24 hr tablet Take 1 tablet (25 mg total) by mouth daily. 30 tablet 11  . mirabegron ER (MYRBETRIQ) 50 MG TB24 tablet Take 50 mg by mouth daily.    . nitroGLYCERIN (NITROSTAT) 0.4 MG SL tablet Place 1 tablet (0.4 mg total) under the tongue every 5 (five) minutes as  needed for chest pain. 25 tablet 3  . NONFORMULARY OR COMPOUNDED ITEM Apply 1 application topically at bedtime. azaleic acid 15%/metronidazole 1%/ivermectin 1%    . pentosan polysulfate (ELMIRON) 100 MG capsule Take 100 mg by mouth daily.     . ranitidine (ZANTAC) 150 MG tablet Take 150 mg by mouth at bedtime.    . vitamin B-12 (CYANOCOBALAMIN) 1000 MCG tablet Take 1,000 mcg by mouth daily.     No current facility-administered medications for this visit.     Allergies:    Allergies  Allergen Reactions  .  Ambien [Zolpidem Tartrate] Other (See Comments)    Hallucinations  . Bee Venom Itching, Shortness Of Breath and Swelling  . Minocycline     Rash  . Peanut-Containing Drug Products Anaphylaxis  . Tape Itching, Rash and Swelling  . Ciprofloxacin Nausea And Vomiting  . Latex Rash  . Shellfish Allergy Rash    Social History:  The patient  reports that she has never smoked. She has never used smokeless tobacco. She reports that she does not drink alcohol or use drugs.   No family history on file.  ROS:  Please see the history of present illness.   No syncope, no bleeding, no orthopnea, no PND.   PHYSICAL EXAM: VS:  BP 116/66   Pulse 75   Ht 5\' 5"  (1.651 m)   Wt 181 lb 9.6 oz (82.4 kg)   BMI 30.22 kg/m  GEN: Well nourished, well developed, in no acute distress  HEENT: normal  Neck: no JVD, carotid bruits, or masses Cardiac: RRR; no murmurs, rubs, or gallops,no edema  Respiratory:  clear to auscultation bilaterally, normal work of breathing GI: soft, nontender, nondistended, + BS MS: no deformity or atrophy  Skin: warm and dry, no rash Neuro:  Alert and Oriented x 3, Strength and sensation are intact Psych: euthymic mood, full affect   EKG:  EKG was ordered today 03/29/17-sinus rhythm sinus arrhythmia 75 personally viewed --03/21/16-sinus rhythm, sinus arrhythmia, heart rate 82, PAC noted. 03/22/15-normal rhythm, 82, nonspecific T-wave changes-03/04/14-sinus rhythm, 83, LVH, left  axis deviation    Carotid Doppler 03/26/14 - 1-39% ICA stenosis. Mild.   LDL 133, HDL 30s  ASSESSMENT AND PLAN:  Paroxysmal atrial tachycardia  - Doing well on metoprolol. No side effects. No recent severe palpitations.  Vasculitis urticarial  - Now seeing [facility] immune dermatologist, Dr. Will Bonnet. She showed me pictures of her rashes. Methotrexate.  Hyperlipidemia  - Continue with statin use. Doing well.   Arm pain/cervical disc disease. Dr. Rolena Infante has been monitoring. Prior neck surgery.  1 year follow up.  Signed, Candee Furbish, MD [facility]  03/29/2017 2:39 PM

## 2017-03-29 NOTE — Patient Instructions (Signed)

## 2017-03-30 ENCOUNTER — Other Ambulatory Visit: Payer: Self-pay

## 2017-03-30 DIAGNOSIS — I471 Supraventricular tachycardia: Secondary | ICD-10-CM

## 2017-03-30 DIAGNOSIS — M1812 Unilateral primary osteoarthritis of first carpometacarpal joint, left hand: Secondary | ICD-10-CM | POA: Diagnosis not present

## 2017-05-01 DIAGNOSIS — N3010 Interstitial cystitis (chronic) without hematuria: Secondary | ICD-10-CM | POA: Diagnosis not present

## 2017-05-05 ENCOUNTER — Other Ambulatory Visit: Payer: Self-pay | Admitting: Cardiology

## 2017-05-09 DIAGNOSIS — M25521 Pain in right elbow: Secondary | ICD-10-CM | POA: Diagnosis not present

## 2017-05-09 DIAGNOSIS — M25522 Pain in left elbow: Secondary | ICD-10-CM | POA: Diagnosis not present

## 2017-05-09 DIAGNOSIS — Z5181 Encounter for therapeutic drug level monitoring: Secondary | ICD-10-CM | POA: Diagnosis not present

## 2017-06-04 MED ORDER — LYRICA 75 MG CAPSULE
ORAL_CAPSULE | 0 refills | 0.00000 days | Status: CP
Start: 2017-06-04 — End: 2017-08-06

## 2017-06-13 MED ORDER — HYDROXYCHLOROQUINE 200 MG TABLET
ORAL_TABLET | Freq: Two times a day (BID) | ORAL | 2 refills | 0.00000 days | Status: CP
Start: 2017-06-13 — End: 2017-06-29

## 2017-06-16 DIAGNOSIS — M542 Cervicalgia: Secondary | ICD-10-CM | POA: Diagnosis not present

## 2017-06-16 DIAGNOSIS — G8929 Other chronic pain: Secondary | ICD-10-CM | POA: Diagnosis not present

## 2017-06-16 DIAGNOSIS — M5442 Lumbago with sciatica, left side: Secondary | ICD-10-CM | POA: Diagnosis not present

## 2017-06-20 MED ORDER — METHOTREXATE SODIUM (PF) 25 MG/ML INJECTION SOLUTION
0 refills | 0.00000 days | Status: CP
Start: 2017-06-20 — End: 2017-06-29

## 2017-06-29 ENCOUNTER — Ambulatory Visit
Admission: RE | Admit: 2017-06-29 | Discharge: 2017-06-29 | Payer: MEDICARE | Attending: Dermatology | Admitting: Dermatology

## 2017-06-29 DIAGNOSIS — R682 Dry mouth, unspecified: Secondary | ICD-10-CM

## 2017-06-29 DIAGNOSIS — M318 Other specified necrotizing vasculopathies: Principal | ICD-10-CM

## 2017-06-29 DIAGNOSIS — L282 Other prurigo: Secondary | ICD-10-CM

## 2017-06-29 DIAGNOSIS — Z79899 Other long term (current) drug therapy: Secondary | ICD-10-CM

## 2017-06-29 DIAGNOSIS — L309 Dermatitis, unspecified: Secondary | ICD-10-CM

## 2017-06-29 DIAGNOSIS — L308 Other specified dermatitis: Secondary | ICD-10-CM | POA: Diagnosis not present

## 2017-06-29 MED ORDER — LEUCOVORIN CALCIUM 5 MG TABLET
ORAL_TABLET | 1 refills | 0 days | Status: CP
Start: 2017-06-29 — End: 2017-09-28

## 2017-06-29 MED ORDER — METHOTREXATE SODIUM (PF) 25 MG/ML INJECTION SOLUTION
0 refills | 0 days | Status: CP
Start: 2017-06-29 — End: 2017-09-28

## 2017-06-29 MED ORDER — HYDROXYCHLOROQUINE 200 MG TABLET
ORAL_TABLET | Freq: Two times a day (BID) | ORAL | 2 refills | 0 days | Status: CP
Start: 2017-06-29 — End: 2017-08-24

## 2017-06-29 MED ORDER — NEEDLE (DISP) 27 GAUGE X 1/2"
0 refills | 0 days | Status: CP
Start: 2017-06-29 — End: ?

## 2017-07-03 ENCOUNTER — Other Ambulatory Visit: Payer: Self-pay | Admitting: Cardiology

## 2017-07-05 DIAGNOSIS — M533 Sacrococcygeal disorders, not elsewhere classified: Secondary | ICD-10-CM | POA: Diagnosis not present

## 2017-07-09 DIAGNOSIS — M1812 Unilateral primary osteoarthritis of first carpometacarpal joint, left hand: Secondary | ICD-10-CM | POA: Diagnosis not present

## 2017-07-17 DIAGNOSIS — M542 Cervicalgia: Secondary | ICD-10-CM | POA: Diagnosis not present

## 2017-07-18 MED ORDER — ONDANSETRON 8 MG DISINTEGRATING TABLET
ORAL_TABLET | Freq: Three times a day (TID) | ORAL | 2 refills | 0.00000 days | Status: CP | PRN
Start: 2017-07-18 — End: 2017-08-17

## 2017-08-06 MED ORDER — PREGABALIN 75 MG CAPSULE
ORAL_CAPSULE | 0 refills | 0.00000 days | Status: CP
Start: 2017-08-06 — End: 2017-08-24

## 2017-08-08 DIAGNOSIS — M2550 Pain in unspecified joint: Secondary | ICD-10-CM | POA: Diagnosis not present

## 2017-08-08 DIAGNOSIS — E663 Overweight: Secondary | ICD-10-CM | POA: Diagnosis not present

## 2017-08-08 DIAGNOSIS — R5382 Chronic fatigue, unspecified: Secondary | ICD-10-CM | POA: Diagnosis not present

## 2017-08-08 DIAGNOSIS — F329 Major depressive disorder, single episode, unspecified: Secondary | ICD-10-CM | POA: Diagnosis not present

## 2017-08-08 DIAGNOSIS — J45998 Other asthma: Secondary | ICD-10-CM | POA: Diagnosis not present

## 2017-08-08 DIAGNOSIS — Z23 Encounter for immunization: Secondary | ICD-10-CM | POA: Diagnosis not present

## 2017-08-08 DIAGNOSIS — Z6828 Body mass index (BMI) 28.0-28.9, adult: Secondary | ICD-10-CM | POA: Diagnosis not present

## 2017-08-17 ENCOUNTER — Other Ambulatory Visit: Payer: Self-pay

## 2017-08-17 MED ORDER — ATORVASTATIN CALCIUM 10 MG PO TABS: 10.0000 mg | ORAL_TABLET | Freq: Every day | ORAL | 3 refills | Status: DC

## 2017-08-17 MED ORDER — METOPROLOL SUCCINATE ER 25 MG PO TB24: 25.0000 mg | ORAL_TABLET | Freq: Every day | ORAL | 3 refills | Status: DC

## 2017-08-17 MED ORDER — METHOTREXATE SODIUM 25 MG/ML INJECTION SOLUTION
SUBCUTANEOUS | 0 refills | 0.00000 days | Status: CP
Start: 2017-08-17 — End: 2017-09-28

## 2017-08-21 MED ORDER — ATORVASTATIN CALCIUM 10 MG PO TABS: 10.0000 mg | ORAL_TABLET | Freq: Every day | ORAL | 3 refills | Status: DC

## 2017-08-21 MED ORDER — METOPROLOL SUCCINATE ER 25 MG PO TB24: 25.0000 mg | ORAL_TABLET | Freq: Every day | ORAL | 3 refills | Status: DC

## 2017-08-21 NOTE — Addendum Note (Signed)
Addended by: Derl Barrow on: 08/21/2017 08:33 AM   Modules accepted: Orders

## 2017-08-24 MED ORDER — PREGABALIN 75 MG CAPSULE
ORAL_CAPSULE | 0 refills | 0.00000 days | Status: CP
Start: 2017-08-24 — End: 2017-12-06

## 2017-08-24 MED ORDER — HYDROXYCHLOROQUINE 200 MG TABLET
ORAL_TABLET | Freq: Two times a day (BID) | ORAL | 2 refills | 0.00000 days | Status: CP
Start: 2017-08-24 — End: 2017-09-28

## 2017-09-13 DIAGNOSIS — Z79899 Other long term (current) drug therapy: Secondary | ICD-10-CM | POA: Diagnosis not present

## 2017-09-13 DIAGNOSIS — Z124 Encounter for screening for malignant neoplasm of cervix: Secondary | ICD-10-CM | POA: Diagnosis not present

## 2017-09-24 DIAGNOSIS — J3089 Other allergic rhinitis: Secondary | ICD-10-CM | POA: Diagnosis not present

## 2017-09-24 DIAGNOSIS — Z91010 Allergy to peanuts: Secondary | ICD-10-CM | POA: Diagnosis not present

## 2017-09-24 DIAGNOSIS — J4540 Moderate persistent asthma, uncomplicated: Secondary | ICD-10-CM | POA: Diagnosis not present

## 2017-09-24 DIAGNOSIS — J3081 Allergic rhinitis due to animal (cat) (dog) hair and dander: Secondary | ICD-10-CM | POA: Diagnosis not present

## 2017-09-28 ENCOUNTER — Ambulatory Visit
Admission: RE | Admit: 2017-09-28 | Discharge: 2017-09-28 | Payer: MEDICARE | Attending: Dermatology | Admitting: Dermatology

## 2017-09-28 DIAGNOSIS — R110 Nausea: Secondary | ICD-10-CM

## 2017-09-28 DIAGNOSIS — L281 Prurigo nodularis: Secondary | ICD-10-CM

## 2017-09-28 DIAGNOSIS — Z79899 Other long term (current) drug therapy: Secondary | ICD-10-CM

## 2017-09-28 DIAGNOSIS — L539 Erythematous condition, unspecified: Secondary | ICD-10-CM

## 2017-09-28 DIAGNOSIS — M318 Other specified necrotizing vasculopathies: Principal | ICD-10-CM

## 2017-09-28 DIAGNOSIS — L538 Other specified erythematous conditions: Secondary | ICD-10-CM | POA: Diagnosis not present

## 2017-09-28 MED ORDER — METHOTREXATE SODIUM (PF) 25 MG/ML INJECTION SOLUTION
0 refills | 0 days | Status: CP
Start: 2017-09-28 — End: 2017-12-10

## 2017-09-28 MED ORDER — HYDROXYCHLOROQUINE 200 MG TABLET
ORAL_TABLET | Freq: Two times a day (BID) | ORAL | 2 refills | 0.00000 days | Status: CP
Start: 2017-09-28 — End: 2018-01-04

## 2017-09-28 MED ORDER — LEUCOVORIN CALCIUM 5 MG TABLET
ORAL_TABLET | 1 refills | 0 days | Status: CP
Start: 2017-09-28 — End: 2017-11-28

## 2017-09-28 MED ORDER — ONDANSETRON HCL 8 MG TABLET
ORAL_TABLET | Freq: Three times a day (TID) | ORAL | 1 refills | 0 days | Status: CP | PRN
Start: 2017-09-28 — End: 2017-10-29

## 2017-10-31 DIAGNOSIS — Z1231 Encounter for screening mammogram for malignant neoplasm of breast: Secondary | ICD-10-CM | POA: Diagnosis not present

## 2017-11-28 ENCOUNTER — Ambulatory Visit: Admit: 2017-11-28 | Discharge: 2017-11-28 | Payer: MEDICARE

## 2017-11-28 DIAGNOSIS — M79641 Pain in right hand: Secondary | ICD-10-CM

## 2017-11-28 DIAGNOSIS — M797 Fibromyalgia: Secondary | ICD-10-CM

## 2017-11-28 DIAGNOSIS — M318 Other specified necrotizing vasculopathies: Principal | ICD-10-CM

## 2017-11-28 DIAGNOSIS — M79642 Pain in left hand: Secondary | ICD-10-CM

## 2017-11-28 DIAGNOSIS — M2550 Pain in unspecified joint: Secondary | ICD-10-CM

## 2017-11-28 DIAGNOSIS — M150 Primary generalized (osteo)arthritis: Secondary | ICD-10-CM

## 2017-11-28 DIAGNOSIS — Z5181 Encounter for therapeutic drug level monitoring: Secondary | ICD-10-CM

## 2017-11-28 DIAGNOSIS — Z79899 Other long term (current) drug therapy: Secondary | ICD-10-CM

## 2017-11-28 DIAGNOSIS — M7062 Trochanteric bursitis, left hip: Secondary | ICD-10-CM

## 2017-11-28 DIAGNOSIS — Z9225 Personal history of immunosupression therapy: Secondary | ICD-10-CM | POA: Diagnosis not present

## 2017-11-28 DIAGNOSIS — G9332 Myalgic encephalomyelitis/chronic fatigue syndrome: Secondary | ICD-10-CM | POA: Diagnosis present

## 2017-11-28 DIAGNOSIS — M19071 Primary osteoarthritis, right ankle and foot: Secondary | ICD-10-CM | POA: Diagnosis not present

## 2017-11-28 DIAGNOSIS — R5382 Chronic fatigue, unspecified: Secondary | ICD-10-CM | POA: Diagnosis present

## 2017-11-28 DIAGNOSIS — M545 Low back pain: Secondary | ICD-10-CM | POA: Diagnosis not present

## 2017-11-28 DIAGNOSIS — M19072 Primary osteoarthritis, left ankle and foot: Secondary | ICD-10-CM | POA: Diagnosis not present

## 2017-11-28 DIAGNOSIS — Z7982 Long term (current) use of aspirin: Secondary | ICD-10-CM | POA: Diagnosis not present

## 2017-11-28 DIAGNOSIS — M1811 Unilateral primary osteoarthritis of first carpometacarpal joint, right hand: Secondary | ICD-10-CM | POA: Diagnosis not present

## 2017-11-28 DIAGNOSIS — M25552 Pain in left hip: Secondary | ICD-10-CM | POA: Diagnosis not present

## 2017-11-28 DIAGNOSIS — R208 Other disturbances of skin sensation: Secondary | ICD-10-CM | POA: Diagnosis not present

## 2017-11-28 DIAGNOSIS — D510 Vitamin B12 deficiency anemia due to intrinsic factor deficiency: Secondary | ICD-10-CM | POA: Diagnosis present

## 2017-11-28 DIAGNOSIS — M19042 Primary osteoarthritis, left hand: Secondary | ICD-10-CM | POA: Diagnosis not present

## 2017-11-28 DIAGNOSIS — M79674 Pain in right toe(s): Secondary | ICD-10-CM | POA: Diagnosis not present

## 2017-11-28 DIAGNOSIS — M25512 Pain in left shoulder: Secondary | ICD-10-CM | POA: Diagnosis not present

## 2017-11-28 DIAGNOSIS — I471 Supraventricular tachycardia: Secondary | ICD-10-CM | POA: Diagnosis not present

## 2017-11-28 DIAGNOSIS — M25511 Pain in right shoulder: Secondary | ICD-10-CM | POA: Diagnosis not present

## 2017-11-28 DIAGNOSIS — Z7951 Long term (current) use of inhaled steroids: Secondary | ICD-10-CM | POA: Diagnosis not present

## 2017-11-28 DIAGNOSIS — R5383 Other fatigue: Secondary | ICD-10-CM | POA: Diagnosis present

## 2017-11-28 DIAGNOSIS — R110 Nausea: Secondary | ICD-10-CM | POA: Diagnosis not present

## 2017-11-28 DIAGNOSIS — Z792 Long term (current) use of antibiotics: Secondary | ICD-10-CM | POA: Diagnosis not present

## 2017-11-28 DIAGNOSIS — E039 Hypothyroidism, unspecified: Secondary | ICD-10-CM | POA: Diagnosis not present

## 2017-11-28 DIAGNOSIS — M542 Cervicalgia: Secondary | ICD-10-CM | POA: Diagnosis not present

## 2017-11-28 DIAGNOSIS — Z7409 Other reduced mobility: Secondary | ICD-10-CM | POA: Diagnosis not present

## 2017-11-28 DIAGNOSIS — M180 Bilateral primary osteoarthritis of first carpometacarpal joints: Secondary | ICD-10-CM | POA: Diagnosis not present

## 2017-11-28 DIAGNOSIS — E063 Autoimmune thyroiditis: Secondary | ICD-10-CM | POA: Insufficient documentation

## 2017-11-28 DIAGNOSIS — M19041 Primary osteoarthritis, right hand: Secondary | ICD-10-CM | POA: Diagnosis not present

## 2017-11-28 MED ORDER — LEUCOVORIN CALCIUM 5 MG TABLET
ORAL_TABLET | ORAL | 1 refills | 0 days | Status: CP
Start: 2017-11-28 — End: 2018-01-04

## 2017-11-28 MED ORDER — DICLOFENAC 1 % TOPICAL GEL
Freq: Four times a day (QID) | TOPICAL | 6 refills | 0.00000 days | Status: CP
Start: 2017-11-28 — End: 2018-11-28

## 2017-12-06 DIAGNOSIS — M25652 Stiffness of left hip, not elsewhere classified: Secondary | ICD-10-CM | POA: Diagnosis not present

## 2017-12-06 DIAGNOSIS — M6281 Muscle weakness (generalized): Secondary | ICD-10-CM | POA: Diagnosis not present

## 2017-12-06 DIAGNOSIS — M25552 Pain in left hip: Secondary | ICD-10-CM | POA: Diagnosis not present

## 2017-12-06 DIAGNOSIS — M25651 Stiffness of right hip, not elsewhere classified: Secondary | ICD-10-CM | POA: Diagnosis not present

## 2017-12-06 MED ORDER — PREGABALIN 75 MG CAPSULE
ORAL_CAPSULE | 0 refills | 0.00000 days | Status: CP
Start: 2017-12-06 — End: ?

## 2017-12-10 DIAGNOSIS — M25651 Stiffness of right hip, not elsewhere classified: Secondary | ICD-10-CM | POA: Diagnosis not present

## 2017-12-10 DIAGNOSIS — M6281 Muscle weakness (generalized): Secondary | ICD-10-CM | POA: Diagnosis not present

## 2017-12-10 DIAGNOSIS — M25652 Stiffness of left hip, not elsewhere classified: Secondary | ICD-10-CM | POA: Diagnosis not present

## 2017-12-10 DIAGNOSIS — M25552 Pain in left hip: Secondary | ICD-10-CM | POA: Diagnosis not present

## 2017-12-10 MED ORDER — METHOTREXATE SODIUM (PF) 25 MG/ML INJECTION SOLUTION
0 refills | 0 days | Status: CP
Start: 2017-12-10 — End: 2018-01-04

## 2017-12-11 DIAGNOSIS — M1812 Unilateral primary osteoarthritis of first carpometacarpal joint, left hand: Secondary | ICD-10-CM | POA: Insufficient documentation

## 2017-12-11 DIAGNOSIS — M13842 Other specified arthritis, left hand: Secondary | ICD-10-CM | POA: Diagnosis not present

## 2017-12-12 ENCOUNTER — Encounter: Payer: Self-pay | Admitting: Cardiology

## 2017-12-12 ENCOUNTER — Ambulatory Visit (INDEPENDENT_AMBULATORY_CARE_PROVIDER_SITE_OTHER): Payer: Medicare Other | Admitting: Cardiology

## 2017-12-12 VITALS — BP 108/64 | HR 72 | Ht 65.0 in | Wt 164.4 lb

## 2017-12-12 DIAGNOSIS — R0789 Other chest pain: Secondary | ICD-10-CM | POA: Diagnosis not present

## 2017-12-12 DIAGNOSIS — I471 Supraventricular tachycardia: Secondary | ICD-10-CM | POA: Diagnosis not present

## 2017-12-12 DIAGNOSIS — E7800 Pure hypercholesterolemia, unspecified: Secondary | ICD-10-CM | POA: Diagnosis not present

## 2017-12-12 DIAGNOSIS — I4719 Other supraventricular tachycardia: Secondary | ICD-10-CM

## 2017-12-12 MED ORDER — NITROGLYCERIN 0.4 MG SL SUBL: 0.4000 mg | SUBLINGUAL_TABLET | SUBLINGUAL | 3 refills | Status: DC | PRN

## 2017-12-12 NOTE — Progress Notes (Signed)
[place] [facility]. [address], [address] [place], [place]  [address] Phone: [phone] Fax:  [phone]  Date:  12/12/2017   ID:  [name], DOB 03/28/1956, MRN [id number]  PCP:  Kathyrn Lass, MD   History of Present Illness: [name] is a 62 y.o. female with PAT (paroxysmal atrial tachycardia) who underwent echocardiogram and nuclear stress test on 07/11/10 both reassuring showing normal EF, no ischemia, mild diastolic dysfunction here for followup. This testing was performed secondary to burning/chest tightness and breathlessness. Possible GERD.she has had an evaluation by pulmonary medicine and was given Advair and this has helped considerably with her breathlessness. She was diagnosed with asthma.  She's also had paroxysmal atrial tachycardia for which beta blocker is being utilized and helpful.   Unfortunately her main concerns currently are her autoimmune condition, leukocytoclastic vasculitis. Her dermatologist has been prescribing her methotrexate. She is being monitored closely. This can make her skin very sensitive to the touch.  02/12/13 - this has been a stressful year for her. Her mother with Alzheimer's has been sick. She has moved. She found that she had a sister. Overall, no significant cardiac symptoms. She has gained some weight.   03/04/14 - Sister with stroke. Feeding tube. At one point she was concerned about her on primary prevention for stroke and she cannot take aspirin because of severe allergy. She has not been taking Plavix for primary prevention. I'm fine with this. She has lost a good amount of weight. Recent hammertoe surgery, bunion removal. Had a postoperative infection. Taking antibiotics.  02/2015 - Orthostatic, dizzy cut back on Toprol.  Sister died November 24, 2022, mother died as well. No chest pain, no shortness of breath, no syncope. She was having orthostatic dizziness.  03/21/16 C7 surgery disc, still grieving loss of sister, Dr. Rolena Infante. Left tingling arm. Chest  pain last weeks, no fluttering.neuropathy in feet. Hard to sleep. Atypical chest discomfort associated with arm pain and neck disease. Nonexertional. Discussed lipids. Discussed feet burning at rest.  03/29/17 - heart doing well, no CP, no SOB. Rare palpitations. Vasculitis came out of remission. Rash is quite severe, painful.  12/12/17 - had to use NTG once. Had been feeling some financial stress.  Bill had recently changed his job.  This was a centralized chest discomfort that seem to be relieved with the nitroglycerin.  This is not been a recurring theme.  This was nonexertional.  She has been diagnosed with fibromyalgia as well.  No shortness of breath, no syncope.  No associated palpitations.  She is back in the swimming pool and she has lost 20 pounds.   Wt Readings from Last 3 Encounters:  12/12/17 164 lb 6.4 oz (74.6 kg)  03/29/17 181 lb 9.6 oz (82.4 kg)  01/10/17 181 lb (82.1 kg)     Past Medical History:  Diagnosis Date  . Back pain   . Chronic fatigue syndrome    Dr. Sabra Heck  . Cough    /SOB, PFT's nl, improved with Bronchodilation 8/11  . Esophageal spasm    GERD Dr. Sabra Heck, Dr. Wynetta Emery; egd 2006 nl; UGI 3/13 Small HH, moderate GERD, nl motility; seen at [facility] ([place]), egd? neg, sone improvement on sucralfate susp  . Foot pain    Dr. Rushie Nyhan  . High triglycerides   . Hypothyroidism    Dr. Lewis Shock 4/14  . Leukocytoclastic vasculitis ([place])    Dr. Tonia Brooms  . Low HDL (under 40)   . Paroxysmal atrial tachycardia ([place])  Dr. Marlou Porch  . Pernicious anemia    Dr. Sabra Heck  . Reflux    ? delayed gastric emptying--GES nl 01/2012 (6% at 2hrs)  . RLS (restless legs syndrome)    low ferritin Dr. Sabra Heck  . Uterine carcinoma (HCC)    Dr. Polly Cobia    Past Surgical History:  Procedure Laterality Date  . BUNIONECTOMY    . COLONOSCOPY     scr, 12/2008 ([facility]) nl  . HAMMER TOE SURGERY    . LUNG BIOPSY     L Lower lung pulm nodules, largest 4.60mm, low risk, repeat CT in 1 yr now  following with pulm at [facility]; 9/13 Stable tiny lung nodules, no f/u needed  . METATARSAL OSTEOTOMY    . US ECHOCARDIOGRAPHY     with Nuclear test, Dr. Marlou Porch, Low risk    Current Outpatient Medications  Medication Sig Dispense Refill  . albuterol (PROVENTIL HFA;VENTOLIN HFA) 108 (90 Base) MCG/ACT inhaler Inhale 2 puffs into the lungs every 4 (four) hours as needed for wheezing or shortness of breath.    . ALPRAZolam (XANAX) 1 MG tablet Take 0.5 mg by mouth daily.     Marland Kitchen aspirin EC 81 MG tablet Take 1 tablet (81 mg total) by mouth daily. 90 tablet 3  . atorvastatin (LIPITOR) 10 MG tablet Take 1 tablet (10 mg total) by mouth daily. 90 tablet 3  . Bioflavonoid Products (PERIDIN-C PO) Take 200 mg by mouth daily.    Marland Kitchen buPROPion (ZYBAN) 150 MG 12 hr tablet Take 450 mg by mouth every morning.    . Cholecalciferol (VITAMIN D3) 1000 units CAPS Take 2,000 Units by mouth daily.     Marland Kitchen dexlansoprazole (DEXILANT) 60 MG capsule Take 60 mg by mouth daily.    . diclofenac sodium (VOLTAREN) 1 % GEL Apply 2 g topically 3 (three) times daily.    . Digestive Enzymes (ENZYMATIC DIGESTANT PO) Take 1 tablet by mouth 2 (two) times daily.    Marland Kitchen doxycycline (PERIOSTAT) 20 MG tablet Take 20 mg by mouth 2 (two) times daily.    . DULoxetine (CYMBALTA) 30 MG capsule Take 90 mg by mouth daily.     Marland Kitchen EPINEPHrine 0.3 mg/0.3 mL IJ SOAJ injection Inject 0.3 mg into the muscle as needed (allergies).     . fexofenadine (ALLEGRA) 180 MG tablet Take 180 mg by mouth daily.    . fluticasone-salmeterol (ADVAIR HFA) 115-21 MCG/ACT inhaler Inhale 2 puffs into the lungs 2 (two) times daily as needed ((ASTHMA)).     . hydroxychloroquine (PLAQUENIL) 200 MG tablet Take 200 mg by mouth 2 (two) times daily.    Marland Kitchen leucovorin (WELLCOVORIN) 5 MG tablet Take 5 tablets by mouth every 7 (seven) days.    Marland Kitchen levothyroxine (SYNTHROID, LEVOTHROID) 150 MCG tablet Take 150 mcg by mouth daily before breakfast.    . linaclotide (LINZESS) 145 MCG CAPS  capsule Take 145 mcg by mouth daily before breakfast.    . meloxicam (MOBIC) 15 MG tablet Take 15 mg by mouth daily.    . Methotrexate, PF, 25 MG/0.5ML SOAJ Inject 1 mL into the skin once a week.    . metoprolol succinate (TOPROL-XL) 25 MG 24 hr tablet Take 1 tablet (25 mg total) by mouth daily. 90 tablet 3  . mirabegron ER (MYRBETRIQ) 50 MG TB24 tablet Take 50 mg by mouth daily.    . nitroGLYCERIN (NITROSTAT) 0.4 MG SL tablet Place 1 tablet (0.4 mg total) under the tongue every 5 (five) minutes as needed for chest  pain. 25 tablet 3  . NONFORMULARY OR COMPOUNDED ITEM Apply 1 application topically at bedtime. azaleic acid 15%/metronidazole 1%/ivermectin 1%    . ondansetron (ZOFRAN) 8 MG tablet Take 8 mg by mouth every 8 (eight) hours as needed for nausea or vomiting.    . pentosan polysulfate (ELMIRON) 100 MG capsule Take 100 mg by mouth daily.     . pregabalin (LYRICA) 75 MG capsule Take 75 mg by mouth 2 (two) times daily.    . ranitidine (ZANTAC) 150 MG tablet Take 150 mg by mouth at bedtime.    . vitamin B-12 (CYANOCOBALAMIN) 1000 MCG tablet Take 1,000 mcg by mouth daily.     No current facility-administered medications for this visit.     Allergies:    Allergies  Allergen Reactions  . Ambien [Zolpidem Tartrate] Other (See Comments)    Hallucinations  . Bee Venom Itching, Shortness Of Breath and Swelling  . Minocycline     Rash  . Peanut-Containing Drug Products Anaphylaxis  . Tape Itching, Rash and Swelling  . Ciprofloxacin Nausea And Vomiting  . Latex Rash  . Shellfish Allergy Rash    Social History:  The patient  reports that  has never smoked. she has never used smokeless tobacco. She reports that she does not drink alcohol or use drugs.   No family history on file.  ROS:  Please see the history of present illness.   No syncope, no bleeding, no orthopnea, no PND.   PHYSICAL EXAM: VS:  BP 108/64   Pulse 72   Ht 5\' 5"  (1.651 m)   Wt 164 lb 6.4 oz (74.6 kg)   BMI 27.36  kg/m  GEN: Well nourished, well developed, in no acute distress  HEENT: normal  Neck: no JVD, carotid bruits, or masses Cardiac: RRR; no murmurs, rubs, or gallops,no edema  Respiratory:  clear to auscultation bilaterally, normal work of breathing GI: soft, nontender, nondistended, + BS MS: no deformity or atrophy  Skin: warm and dry, no rash Neuro:  Alert and Oriented x 3, Strength and sensation are intact Psych: euthymic mood, full affect    EKG:  EKG was ordered today 03/29/17-sinus rhythm sinus arrhythmia 75 personally viewed --03/21/16-sinus rhythm, sinus arrhythmia, heart rate 82, PAC noted. 03/22/15-normal rhythm, 82, nonspecific T-wave changes-03/04/14-sinus rhythm, 83, LVH, left axis deviation    Carotid Doppler 03/26/14 - 1-39% ICA stenosis. Mild.   LDL 133, HDL 30s  ASSESSMENT AND PLAN:   Atypical chest pain  - isolated NTG use. If returns will have low threshold for stress testing. Discussed. May be stress related.   Paroxysmal atrial tachycardia  - Doing well on metoprolol. No side effects. No recent severe palpitations. No change.   Vasculitis urticarial  - Now seeing [facility] immune dermatologist, Dr. Will Bonnet. She showed me pictures of her rashes. Methotrexate. Doing well. Has lost weight possibly because of nausea associated with this.   Hyperlipidemia  - Continue with statin use. Doing well. No changes   Arm pain/cervical disc disease. Dr. Rolena Infante has been monitoring. Prior neck surgery.   1 year follow up.  Signed, Candee Furbish, MD [facility]  12/12/2017 2:01 PM

## 2017-12-12 NOTE — Patient Instructions (Signed)
Medication Instructions:  Your physician recommends that you continue on your current medications as directed. Please refer to the Current Medication list given to you today.  Labwork: None  Testing/Procedures: None  Follow-Up: Your physician wants you to follow-up in: 1 year with Dr. Skains.  You will receive a reminder letter in the mail two months in advance. If you don't receive a letter, please call our office to schedule the follow-up appointment.   Any Other Special Instructions Will Be Listed Below (If Applicable).     If you need a refill on your cardiac medications before your next appointment, please call your pharmacy.   

## 2017-12-14 DIAGNOSIS — M7062 Trochanteric bursitis, left hip: Secondary | ICD-10-CM | POA: Diagnosis not present

## 2017-12-14 DIAGNOSIS — M25552 Pain in left hip: Secondary | ICD-10-CM | POA: Diagnosis not present

## 2017-12-17 DIAGNOSIS — M6281 Muscle weakness (generalized): Secondary | ICD-10-CM | POA: Diagnosis not present

## 2017-12-17 DIAGNOSIS — M25552 Pain in left hip: Secondary | ICD-10-CM | POA: Diagnosis not present

## 2017-12-17 DIAGNOSIS — M25651 Stiffness of right hip, not elsewhere classified: Secondary | ICD-10-CM | POA: Diagnosis not present

## 2017-12-17 DIAGNOSIS — M25652 Stiffness of left hip, not elsewhere classified: Secondary | ICD-10-CM | POA: Diagnosis not present

## 2017-12-19 DIAGNOSIS — M25552 Pain in left hip: Secondary | ICD-10-CM | POA: Diagnosis not present

## 2017-12-19 DIAGNOSIS — M25651 Stiffness of right hip, not elsewhere classified: Secondary | ICD-10-CM | POA: Diagnosis not present

## 2017-12-19 DIAGNOSIS — M25652 Stiffness of left hip, not elsewhere classified: Secondary | ICD-10-CM | POA: Diagnosis not present

## 2017-12-19 DIAGNOSIS — M6281 Muscle weakness (generalized): Secondary | ICD-10-CM | POA: Diagnosis not present

## 2017-12-21 DIAGNOSIS — M25652 Stiffness of left hip, not elsewhere classified: Secondary | ICD-10-CM | POA: Diagnosis not present

## 2017-12-21 DIAGNOSIS — M25552 Pain in left hip: Secondary | ICD-10-CM | POA: Diagnosis not present

## 2017-12-21 DIAGNOSIS — M25651 Stiffness of right hip, not elsewhere classified: Secondary | ICD-10-CM | POA: Diagnosis not present

## 2017-12-21 DIAGNOSIS — M6281 Muscle weakness (generalized): Secondary | ICD-10-CM | POA: Diagnosis not present

## 2017-12-27 DIAGNOSIS — M25552 Pain in left hip: Secondary | ICD-10-CM | POA: Diagnosis not present

## 2017-12-27 DIAGNOSIS — M25652 Stiffness of left hip, not elsewhere classified: Secondary | ICD-10-CM | POA: Diagnosis not present

## 2017-12-27 DIAGNOSIS — M25651 Stiffness of right hip, not elsewhere classified: Secondary | ICD-10-CM | POA: Diagnosis not present

## 2017-12-27 DIAGNOSIS — M6281 Muscle weakness (generalized): Secondary | ICD-10-CM | POA: Diagnosis not present

## 2018-01-02 DIAGNOSIS — M6281 Muscle weakness (generalized): Secondary | ICD-10-CM | POA: Diagnosis not present

## 2018-01-02 DIAGNOSIS — M25652 Stiffness of left hip, not elsewhere classified: Secondary | ICD-10-CM | POA: Diagnosis not present

## 2018-01-02 DIAGNOSIS — M25552 Pain in left hip: Secondary | ICD-10-CM | POA: Diagnosis not present

## 2018-01-02 DIAGNOSIS — M25651 Stiffness of right hip, not elsewhere classified: Secondary | ICD-10-CM | POA: Diagnosis not present

## 2018-01-04 ENCOUNTER — Ambulatory Visit: Admit: 2018-01-04 | Discharge: 2018-01-05 | Payer: MEDICARE

## 2018-01-04 DIAGNOSIS — M318 Other specified necrotizing vasculopathies: Principal | ICD-10-CM

## 2018-01-04 DIAGNOSIS — L719 Rosacea, unspecified: Secondary | ICD-10-CM

## 2018-01-04 DIAGNOSIS — L609 Nail disorder, unspecified: Secondary | ICD-10-CM

## 2018-01-04 MED ORDER — METHOTREXATE SODIUM (PF) 25 MG/ML INJECTION SOLUTION
2 refills | 0 days | Status: CP
Start: 2018-01-04 — End: 2018-04-19

## 2018-01-04 MED ORDER — LEUCOVORIN CALCIUM 5 MG TABLET
ORAL_TABLET | ORAL | 1 refills | 0.00000 days | Status: CP
Start: 2018-01-04 — End: 2018-04-19

## 2018-01-04 MED ORDER — HYDROXYCHLOROQUINE 200 MG TABLET
ORAL_TABLET | Freq: Two times a day (BID) | ORAL | 2 refills | 0.00000 days | Status: CP
Start: 2018-01-04 — End: 2018-04-19

## 2018-01-04 MED ORDER — METRONIDAZOLE 0.75 % TOPICAL CREAM
Freq: Two times a day (BID) | TOPICAL | 5 refills | 0 days | Status: CP
Start: 2018-01-04 — End: 2018-04-19

## 2018-01-07 DIAGNOSIS — M25652 Stiffness of left hip, not elsewhere classified: Secondary | ICD-10-CM | POA: Diagnosis not present

## 2018-01-07 DIAGNOSIS — M25552 Pain in left hip: Secondary | ICD-10-CM | POA: Diagnosis not present

## 2018-01-07 DIAGNOSIS — M6281 Muscle weakness (generalized): Secondary | ICD-10-CM | POA: Diagnosis not present

## 2018-01-07 DIAGNOSIS — M25651 Stiffness of right hip, not elsewhere classified: Secondary | ICD-10-CM | POA: Diagnosis not present

## 2018-01-09 DIAGNOSIS — M25552 Pain in left hip: Secondary | ICD-10-CM | POA: Diagnosis not present

## 2018-01-09 DIAGNOSIS — M25651 Stiffness of right hip, not elsewhere classified: Secondary | ICD-10-CM | POA: Diagnosis not present

## 2018-01-09 DIAGNOSIS — M25652 Stiffness of left hip, not elsewhere classified: Secondary | ICD-10-CM | POA: Diagnosis not present

## 2018-01-09 DIAGNOSIS — M6281 Muscle weakness (generalized): Secondary | ICD-10-CM | POA: Diagnosis not present

## 2018-01-22 DIAGNOSIS — M7062 Trochanteric bursitis, left hip: Secondary | ICD-10-CM | POA: Insufficient documentation

## 2018-01-29 DIAGNOSIS — N3010 Interstitial cystitis (chronic) without hematuria: Secondary | ICD-10-CM | POA: Diagnosis not present

## 2018-01-29 DIAGNOSIS — R8279 Other abnormal findings on microbiological examination of urine: Secondary | ICD-10-CM | POA: Diagnosis not present

## 2018-02-04 DIAGNOSIS — M2550 Pain in unspecified joint: Secondary | ICD-10-CM | POA: Diagnosis not present

## 2018-02-04 DIAGNOSIS — Z0000 Encounter for general adult medical examination without abnormal findings: Secondary | ICD-10-CM | POA: Diagnosis not present

## 2018-02-04 DIAGNOSIS — F325 Major depressive disorder, single episode, in full remission: Secondary | ICD-10-CM | POA: Diagnosis not present

## 2018-02-04 DIAGNOSIS — J45998 Other asthma: Secondary | ICD-10-CM | POA: Diagnosis not present

## 2018-02-04 DIAGNOSIS — Z23 Encounter for immunization: Secondary | ICD-10-CM | POA: Diagnosis not present

## 2018-02-04 DIAGNOSIS — E663 Overweight: Secondary | ICD-10-CM | POA: Diagnosis not present

## 2018-02-04 DIAGNOSIS — F329 Major depressive disorder, single episode, unspecified: Secondary | ICD-10-CM | POA: Diagnosis not present

## 2018-02-04 DIAGNOSIS — T63441A Toxic effect of venom of bees, accidental (unintentional), initial encounter: Secondary | ICD-10-CM | POA: Diagnosis not present

## 2018-02-04 DIAGNOSIS — Z8542 Personal history of malignant neoplasm of other parts of uterus: Secondary | ICD-10-CM | POA: Diagnosis not present

## 2018-02-04 DIAGNOSIS — Z79899 Other long term (current) drug therapy: Secondary | ICD-10-CM | POA: Diagnosis not present

## 2018-02-04 DIAGNOSIS — R5382 Chronic fatigue, unspecified: Secondary | ICD-10-CM | POA: Diagnosis not present

## 2018-02-04 DIAGNOSIS — Z1389 Encounter for screening for other disorder: Secondary | ICD-10-CM | POA: Diagnosis not present

## 2018-02-13 DIAGNOSIS — N951 Menopausal and female climacteric states: Secondary | ICD-10-CM | POA: Diagnosis not present

## 2018-02-13 DIAGNOSIS — G4700 Insomnia, unspecified: Secondary | ICD-10-CM | POA: Diagnosis not present

## 2018-02-14 ENCOUNTER — Ambulatory Visit: Admit: 2018-02-14 | Discharge: 2018-02-14 | Payer: MEDICARE | Attending: Rheumatology | Primary: Rheumatology

## 2018-02-14 DIAGNOSIS — M79641 Pain in right hand: Principal | ICD-10-CM

## 2018-02-25 DIAGNOSIS — M25652 Stiffness of left hip, not elsewhere classified: Secondary | ICD-10-CM | POA: Diagnosis not present

## 2018-02-25 DIAGNOSIS — M25552 Pain in left hip: Secondary | ICD-10-CM | POA: Diagnosis not present

## 2018-02-25 DIAGNOSIS — M6281 Muscle weakness (generalized): Secondary | ICD-10-CM | POA: Diagnosis not present

## 2018-02-25 DIAGNOSIS — M25651 Stiffness of right hip, not elsewhere classified: Secondary | ICD-10-CM | POA: Diagnosis not present

## 2018-02-27 DIAGNOSIS — M25552 Pain in left hip: Secondary | ICD-10-CM | POA: Diagnosis not present

## 2018-02-27 DIAGNOSIS — M6281 Muscle weakness (generalized): Secondary | ICD-10-CM | POA: Diagnosis not present

## 2018-02-27 DIAGNOSIS — M25651 Stiffness of right hip, not elsewhere classified: Secondary | ICD-10-CM | POA: Diagnosis not present

## 2018-02-27 DIAGNOSIS — M25652 Stiffness of left hip, not elsewhere classified: Secondary | ICD-10-CM | POA: Diagnosis not present

## 2018-03-11 DIAGNOSIS — M25552 Pain in left hip: Secondary | ICD-10-CM | POA: Diagnosis not present

## 2018-03-11 DIAGNOSIS — M25652 Stiffness of left hip, not elsewhere classified: Secondary | ICD-10-CM | POA: Diagnosis not present

## 2018-03-11 DIAGNOSIS — M25651 Stiffness of right hip, not elsewhere classified: Secondary | ICD-10-CM | POA: Diagnosis not present

## 2018-03-11 DIAGNOSIS — M6281 Muscle weakness (generalized): Secondary | ICD-10-CM | POA: Diagnosis not present

## 2018-03-13 DIAGNOSIS — M25652 Stiffness of left hip, not elsewhere classified: Secondary | ICD-10-CM | POA: Diagnosis not present

## 2018-03-13 DIAGNOSIS — M25651 Stiffness of right hip, not elsewhere classified: Secondary | ICD-10-CM | POA: Diagnosis not present

## 2018-03-13 DIAGNOSIS — M6281 Muscle weakness (generalized): Secondary | ICD-10-CM | POA: Diagnosis not present

## 2018-03-13 DIAGNOSIS — M25552 Pain in left hip: Secondary | ICD-10-CM | POA: Diagnosis not present

## 2018-03-14 DIAGNOSIS — E039 Hypothyroidism, unspecified: Secondary | ICD-10-CM | POA: Diagnosis not present

## 2018-03-14 DIAGNOSIS — E063 Autoimmune thyroiditis: Secondary | ICD-10-CM | POA: Diagnosis not present

## 2018-04-01 DIAGNOSIS — M25552 Pain in left hip: Secondary | ICD-10-CM | POA: Diagnosis not present

## 2018-04-01 DIAGNOSIS — M6281 Muscle weakness (generalized): Secondary | ICD-10-CM | POA: Diagnosis not present

## 2018-04-01 DIAGNOSIS — M25651 Stiffness of right hip, not elsewhere classified: Secondary | ICD-10-CM | POA: Diagnosis not present

## 2018-04-01 DIAGNOSIS — M25652 Stiffness of left hip, not elsewhere classified: Secondary | ICD-10-CM | POA: Diagnosis not present

## 2018-04-03 ENCOUNTER — Ambulatory Visit: Payer: Medicare Other | Admitting: Cardiology

## 2018-04-03 DIAGNOSIS — M25552 Pain in left hip: Secondary | ICD-10-CM | POA: Diagnosis not present

## 2018-04-03 DIAGNOSIS — M25652 Stiffness of left hip, not elsewhere classified: Secondary | ICD-10-CM | POA: Diagnosis not present

## 2018-04-03 DIAGNOSIS — M25651 Stiffness of right hip, not elsewhere classified: Secondary | ICD-10-CM | POA: Diagnosis not present

## 2018-04-03 DIAGNOSIS — M6281 Muscle weakness (generalized): Secondary | ICD-10-CM | POA: Diagnosis not present

## 2018-04-08 DIAGNOSIS — M6281 Muscle weakness (generalized): Secondary | ICD-10-CM | POA: Diagnosis not present

## 2018-04-08 DIAGNOSIS — M25651 Stiffness of right hip, not elsewhere classified: Secondary | ICD-10-CM | POA: Diagnosis not present

## 2018-04-08 DIAGNOSIS — M25552 Pain in left hip: Secondary | ICD-10-CM | POA: Diagnosis not present

## 2018-04-08 DIAGNOSIS — M25652 Stiffness of left hip, not elsewhere classified: Secondary | ICD-10-CM | POA: Diagnosis not present

## 2018-04-10 DIAGNOSIS — M25651 Stiffness of right hip, not elsewhere classified: Secondary | ICD-10-CM | POA: Diagnosis not present

## 2018-04-10 DIAGNOSIS — M25652 Stiffness of left hip, not elsewhere classified: Secondary | ICD-10-CM | POA: Diagnosis not present

## 2018-04-10 DIAGNOSIS — M25552 Pain in left hip: Secondary | ICD-10-CM | POA: Diagnosis not present

## 2018-04-10 DIAGNOSIS — M6281 Muscle weakness (generalized): Secondary | ICD-10-CM | POA: Diagnosis not present

## 2018-04-19 ENCOUNTER — Ambulatory Visit: Admit: 2018-04-19 | Discharge: 2018-04-20 | Payer: MEDICARE

## 2018-04-19 DIAGNOSIS — Z79899 Other long term (current) drug therapy: Principal | ICD-10-CM

## 2018-04-19 DIAGNOSIS — L719 Rosacea, unspecified: Secondary | ICD-10-CM

## 2018-04-19 DIAGNOSIS — M318 Other specified necrotizing vasculopathies: Secondary | ICD-10-CM

## 2018-04-19 DIAGNOSIS — R110 Nausea: Secondary | ICD-10-CM

## 2018-04-19 MED ORDER — LEUCOVORIN CALCIUM 5 MG TABLET
ORAL_TABLET | ORAL | 1 refills | 0.00000 days | Status: CP
Start: 2018-04-19 — End: ?

## 2018-04-19 MED ORDER — METRONIDAZOLE 0.75 % TOPICAL CREAM
Freq: Two times a day (BID) | TOPICAL | 5 refills | 0 days | Status: CP
Start: 2018-04-19 — End: ?

## 2018-04-19 MED ORDER — ONDANSETRON HCL 8 MG TABLET
ORAL_TABLET | Freq: Three times a day (TID) | ORAL | 1 refills | 0 days | Status: CP | PRN
Start: 2018-04-19 — End: 2019-03-26

## 2018-04-19 MED ORDER — METHOTREXATE SODIUM (PF) 25 MG/ML INJECTION SOLUTION
2 refills | 0 days | Status: CP
Start: 2018-04-19 — End: ?

## 2018-04-19 MED ORDER — HYDROXYCHLOROQUINE 200 MG TABLET
ORAL_TABLET | Freq: Two times a day (BID) | ORAL | 5 refills | 0.00000 days | Status: CP
Start: 2018-04-19 — End: 2019-04-19

## 2018-04-24 DIAGNOSIS — M25552 Pain in left hip: Secondary | ICD-10-CM | POA: Diagnosis not present

## 2018-04-24 DIAGNOSIS — M25651 Stiffness of right hip, not elsewhere classified: Secondary | ICD-10-CM | POA: Diagnosis not present

## 2018-04-24 DIAGNOSIS — M6281 Muscle weakness (generalized): Secondary | ICD-10-CM | POA: Diagnosis not present

## 2018-04-24 DIAGNOSIS — M25652 Stiffness of left hip, not elsewhere classified: Secondary | ICD-10-CM | POA: Diagnosis not present

## 2018-05-15 DIAGNOSIS — E039 Hypothyroidism, unspecified: Secondary | ICD-10-CM | POA: Diagnosis not present

## 2018-05-21 DIAGNOSIS — Z79899 Other long term (current) drug therapy: Secondary | ICD-10-CM | POA: Diagnosis not present

## 2018-06-03 ENCOUNTER — Encounter (HOSPITAL_COMMUNITY): Payer: Self-pay | Admitting: General Practice

## 2018-06-03 ENCOUNTER — Inpatient Hospital Stay (HOSPITAL_COMMUNITY)
Admission: EM | Admit: 2018-06-03 | Discharge: 2018-06-15 | DRG: 329 | Disposition: A | Discharge status: Home Health | Payer: Medicare Other | Attending: Internal Medicine | Admitting: Internal Medicine

## 2018-06-03 ENCOUNTER — Other Ambulatory Visit: Payer: Self-pay

## 2018-06-03 ENCOUNTER — Emergency Department (HOSPITAL_COMMUNITY): Payer: Medicare Other

## 2018-06-03 DIAGNOSIS — Z791 Long term (current) use of non-steroidal anti-inflammatories (NSAID): Secondary | ICD-10-CM

## 2018-06-03 DIAGNOSIS — E876 Hypokalemia: Secondary | ICD-10-CM | POA: Clinically undetermined

## 2018-06-03 DIAGNOSIS — Z79890 Hormone replacement therapy (postmenopausal): Secondary | ICD-10-CM

## 2018-06-03 DIAGNOSIS — R10817 Generalized abdominal tenderness: Secondary | ICD-10-CM | POA: Diagnosis not present

## 2018-06-03 DIAGNOSIS — K631 Perforation of intestine (nontraumatic): Secondary | ICD-10-CM | POA: Diagnosis present

## 2018-06-03 DIAGNOSIS — E781 Pure hyperglyceridemia: Secondary | ICD-10-CM | POA: Diagnosis present

## 2018-06-03 DIAGNOSIS — Z91010 Allergy to peanuts: Secondary | ICD-10-CM

## 2018-06-03 DIAGNOSIS — K659 Peritonitis, unspecified: Secondary | ICD-10-CM | POA: Diagnosis present

## 2018-06-03 DIAGNOSIS — K529 Noninfective gastroenteritis and colitis, unspecified: Secondary | ICD-10-CM

## 2018-06-03 DIAGNOSIS — Z91013 Allergy to seafood: Secondary | ICD-10-CM

## 2018-06-03 DIAGNOSIS — G9332 Myalgic encephalomyelitis/chronic fatigue syndrome: Secondary | ICD-10-CM | POA: Diagnosis present

## 2018-06-03 DIAGNOSIS — D72829 Elevated white blood cell count, unspecified: Secondary | ICD-10-CM

## 2018-06-03 DIAGNOSIS — N179 Acute kidney failure, unspecified: Secondary | ICD-10-CM

## 2018-06-03 DIAGNOSIS — Z888 Allergy status to other drugs, medicaments and biological substances status: Secondary | ICD-10-CM

## 2018-06-03 DIAGNOSIS — I776 Arteritis, unspecified: Secondary | ICD-10-CM | POA: Diagnosis present

## 2018-06-03 DIAGNOSIS — K562 Volvulus: Principal | ICD-10-CM | POA: Diagnosis present

## 2018-06-03 DIAGNOSIS — G2581 Restless legs syndrome: Secondary | ICD-10-CM | POA: Diagnosis present

## 2018-06-03 DIAGNOSIS — E669 Obesity, unspecified: Secondary | ICD-10-CM | POA: Diagnosis present

## 2018-06-03 DIAGNOSIS — D510 Vitamin B12 deficiency anemia due to intrinsic factor deficiency: Secondary | ICD-10-CM | POA: Diagnosis present

## 2018-06-03 DIAGNOSIS — K55069 Acute infarction of intestine, part and extent unspecified: Secondary | ICD-10-CM | POA: Diagnosis present

## 2018-06-03 DIAGNOSIS — R739 Hyperglycemia, unspecified: Secondary | ICD-10-CM | POA: Diagnosis present

## 2018-06-03 DIAGNOSIS — J45909 Unspecified asthma, uncomplicated: Secondary | ICD-10-CM | POA: Diagnosis present

## 2018-06-03 DIAGNOSIS — D649 Anemia, unspecified: Secondary | ICD-10-CM | POA: Clinically undetermined

## 2018-06-03 DIAGNOSIS — Z7982 Long term (current) use of aspirin: Secondary | ICD-10-CM

## 2018-06-03 DIAGNOSIS — Z0189 Encounter for other specified special examinations: Secondary | ICD-10-CM

## 2018-06-03 DIAGNOSIS — R1084 Generalized abdominal pain: Secondary | ICD-10-CM | POA: Diagnosis not present

## 2018-06-03 DIAGNOSIS — K5641 Fecal impaction: Secondary | ICD-10-CM | POA: Diagnosis present

## 2018-06-03 DIAGNOSIS — L959 Vasculitis limited to the skin, unspecified: Secondary | ICD-10-CM | POA: Diagnosis present

## 2018-06-03 DIAGNOSIS — I10 Essential (primary) hypertension: Secondary | ICD-10-CM | POA: Diagnosis present

## 2018-06-03 DIAGNOSIS — M797 Fibromyalgia: Secondary | ICD-10-CM | POA: Diagnosis present

## 2018-06-03 DIAGNOSIS — R110 Nausea: Secondary | ICD-10-CM | POA: Diagnosis not present

## 2018-06-03 DIAGNOSIS — Z6828 Body mass index (BMI) 28.0-28.9, adult: Secondary | ICD-10-CM

## 2018-06-03 DIAGNOSIS — N3090 Cystitis, unspecified without hematuria: Secondary | ICD-10-CM | POA: Diagnosis present

## 2018-06-03 DIAGNOSIS — R1030 Lower abdominal pain, unspecified: Secondary | ICD-10-CM | POA: Diagnosis not present

## 2018-06-03 DIAGNOSIS — K9189 Other postprocedural complications and disorders of digestive system: Secondary | ICD-10-CM

## 2018-06-03 DIAGNOSIS — Z66 Do not resuscitate: Secondary | ICD-10-CM | POA: Diagnosis present

## 2018-06-03 DIAGNOSIS — Z79899 Other long term (current) drug therapy: Secondary | ICD-10-CM

## 2018-06-03 DIAGNOSIS — E8770 Fluid overload, unspecified: Secondary | ICD-10-CM | POA: Diagnosis not present

## 2018-06-03 DIAGNOSIS — Z91040 Latex allergy status: Secondary | ICD-10-CM

## 2018-06-03 DIAGNOSIS — R5382 Chronic fatigue, unspecified: Secondary | ICD-10-CM | POA: Diagnosis present

## 2018-06-03 DIAGNOSIS — K567 Ileus, unspecified: Secondary | ICD-10-CM | POA: Diagnosis not present

## 2018-06-03 DIAGNOSIS — N3010 Interstitial cystitis (chronic) without hematuria: Secondary | ICD-10-CM | POA: Diagnosis present

## 2018-06-03 DIAGNOSIS — Z90710 Acquired absence of both cervix and uterus: Secondary | ICD-10-CM

## 2018-06-03 DIAGNOSIS — E039 Hypothyroidism, unspecified: Secondary | ICD-10-CM | POA: Diagnosis present

## 2018-06-03 DIAGNOSIS — R5383 Other fatigue: Secondary | ICD-10-CM | POA: Diagnosis present

## 2018-06-03 DIAGNOSIS — F418 Other specified anxiety disorders: Secondary | ICD-10-CM | POA: Diagnosis present

## 2018-06-03 DIAGNOSIS — Z91030 Bee allergy status: Secondary | ICD-10-CM

## 2018-06-03 DIAGNOSIS — K5289 Other specified noninfective gastroenteritis and colitis: Secondary | ICD-10-CM | POA: Diagnosis present

## 2018-06-03 DIAGNOSIS — K5900 Constipation, unspecified: Secondary | ICD-10-CM | POA: Diagnosis not present

## 2018-06-03 DIAGNOSIS — E785 Hyperlipidemia, unspecified: Secondary | ICD-10-CM | POA: Diagnosis present

## 2018-06-03 DIAGNOSIS — F329 Major depressive disorder, single episode, unspecified: Secondary | ICD-10-CM | POA: Diagnosis not present

## 2018-06-03 DIAGNOSIS — Z881 Allergy status to other antibiotic agents status: Secondary | ICD-10-CM

## 2018-06-03 DIAGNOSIS — R109 Unspecified abdominal pain: Secondary | ICD-10-CM

## 2018-06-03 DIAGNOSIS — G894 Chronic pain syndrome: Secondary | ICD-10-CM | POA: Insufficient documentation

## 2018-06-03 HISTORY — DX: Fibromyalgia: M79.7

## 2018-06-03 HISTORY — DX: Nonrheumatic mitral (valve) prolapse: I34.1

## 2018-06-03 LAB — CBC
HCT: 41.9 % (ref 36.0–46.0)
Hemoglobin: 14.1 g/dL (ref 12.0–15.0)
MCH: 30.9 pg (ref 26.0–34.0)
MCHC: 33.7 g/dL (ref 30.0–36.0)
MCV: 91.9 fL (ref 78.0–100.0)
PLATELETS: 380 10*3/uL (ref 150–400)
RBC: 4.56 MIL/uL (ref 3.87–5.11)
RDW: 11.6 % (ref 11.5–15.5)
WBC: 16.6 10*3/uL — AB (ref 4.0–10.5)

## 2018-06-03 LAB — COMPREHENSIVE METABOLIC PANEL
ALBUMIN: 3.9 g/dL (ref 3.5–5.0)
ALK PHOS: 91 U/L (ref 38–126)
ALT: 21 U/L (ref 0–44)
AST: 20 U/L (ref 15–41)
Anion gap: 12 (ref 5–15)
BILIRUBIN TOTAL: 1.1 mg/dL (ref 0.3–1.2)
BUN: 22 mg/dL (ref 8–23)
CO2: 23 mmol/L (ref 22–32)
CREATININE: 0.84 mg/dL (ref 0.44–1.00)
Calcium: 9.1 mg/dL (ref 8.9–10.3)
Chloride: 104 mmol/L (ref 98–111)
GFR calc Af Amer: >60 mL/min (ref >60)
GLUCOSE: 165 mg/dL — AB (ref 70–99)
POTASSIUM: 3.3 mmol/L — AB (ref 3.5–5.1)
Sodium: 139 mmol/L (ref 135–145)
Total Protein: 6.9 g/dL (ref 6.5–8.1)

## 2018-06-03 LAB — LIPASE, BLOOD: Lipase: 42 U/L (ref 11–51)

## 2018-06-03 LAB — HEMOGLOBIN A1C
HEMOGLOBIN A1C: 5.3 % (ref 4.8–5.6)
MEAN PLASMA GLUCOSE: 105.41 mg/dL

## 2018-06-03 LAB — LACTIC ACID, PLASMA: LACTIC ACID, VENOUS: 1.4 mmol/L (ref 0.5–1.9)

## 2018-06-03 MED ORDER — METOPROLOL SUCCINATE ER 25 MG PO TB24
25.0000 mg | ORAL_TABLET | Freq: Every day | ORAL | Status: DC
  Administered 2018-06-04 – 2018-06-05 (×2): 25 mg via ORAL
  Filled 2018-06-03 (×3): qty 1

## 2018-06-03 MED ORDER — DULOXETINE HCL 60 MG PO CPEP
90.0000 mg | ORAL_CAPSULE | Freq: Every day | ORAL | Status: DC
  Administered 2018-06-03 – 2018-06-05 (×3): 90 mg via ORAL
  Filled 2018-06-03 (×4): qty 1

## 2018-06-03 MED ORDER — LEVOTHYROXINE SODIUM 25 MCG PO TABS
137.0000 ug | ORAL_TABLET | Freq: Every day | ORAL | Status: DC
  Administered 2018-06-03 – 2018-06-06 (×3): 137 ug via ORAL
  Filled 2018-06-03 (×4): qty 1

## 2018-06-03 MED ORDER — ALPRAZOLAM 0.25 MG PO TABS: 0.5000 mg | ORAL_TABLET | Freq: Every evening | ORAL | Status: DC | PRN

## 2018-06-03 MED ORDER — ACETAMINOPHEN 325 MG PO TABS
650.0000 mg | ORAL_TABLET | Freq: Four times a day (QID) | ORAL | Status: DC | PRN
  Administered 2018-06-03 – 2018-06-04 (×2): 650 mg via ORAL
  Filled 2018-06-03 (×2): qty 2

## 2018-06-03 MED ORDER — PENTOSAN POLYSULFATE SODIUM 100 MG PO CAPS
100.0000 mg | ORAL_CAPSULE | Freq: Two times a day (BID) | ORAL | Status: DC
  Administered 2018-06-03 – 2018-06-05 (×5): 100 mg via ORAL
  Filled 2018-06-03 (×7): qty 1

## 2018-06-03 MED ORDER — ONDANSETRON HCL 4 MG/2ML IJ SOLN
4.0000 mg | Freq: Once | INTRAMUSCULAR | Status: AC | PRN
  Administered 2018-06-03: 4 mg via INTRAVENOUS
  Filled 2018-06-03 (×2): qty 2

## 2018-06-03 MED ORDER — SODIUM CHLORIDE 0.9 % IV SOLN
8.0000 mg | Freq: Three times a day (TID) | INTRAVENOUS | Status: DC
  Administered 2018-06-03 – 2018-06-14 (×32): 8 mg via INTRAVENOUS
  Filled 2018-06-03 (×40): qty 4

## 2018-06-03 MED ORDER — ACETAMINOPHEN 650 MG RE SUPP: 650.0000 mg | Freq: Four times a day (QID) | RECTAL | Status: DC | PRN

## 2018-06-03 MED ORDER — POLYETHYLENE GLYCOL 3350 17 G PO PACK: 17.0000 g | PACK | Freq: Every day | ORAL | Status: DC | PRN

## 2018-06-03 MED ORDER — CIPROFLOXACIN IN D5W 400 MG/200ML IV SOLN
400.0000 mg | Freq: Once | INTRAVENOUS | Status: DC
  Filled 2018-06-03: qty 200

## 2018-06-03 MED ORDER — MORPHINE SULFATE (PF) 4 MG/ML IV SOLN
4.0000 mg | Freq: Once | INTRAVENOUS | Status: AC
  Administered 2018-06-03: 4 mg via INTRAVENOUS
  Filled 2018-06-03: qty 1

## 2018-06-03 MED ORDER — SODIUM CHLORIDE 0.9 % IV SOLN: 8.0000 mg | Freq: Three times a day (TID) | INTRAVENOUS | Status: DC

## 2018-06-03 MED ORDER — ENOXAPARIN SODIUM 40 MG/0.4ML ~~LOC~~ SOLN
40.0000 mg | Freq: Every day | SUBCUTANEOUS | Status: DC
  Administered 2018-06-03 – 2018-06-14 (×12): 40 mg via SUBCUTANEOUS
  Filled 2018-06-03 (×14): qty 0.4

## 2018-06-03 MED ORDER — POTASSIUM CHLORIDE 10 MEQ/100ML IV SOLN
10.0000 meq | INTRAVENOUS | Status: AC
  Administered 2018-06-03: 10 meq via INTRAVENOUS
  Filled 2018-06-03: qty 100

## 2018-06-03 MED ORDER — ALBUTEROL SULFATE (2.5 MG/3ML) 0.083% IN NEBU: 2.5000 mg | INHALATION_SOLUTION | RESPIRATORY_TRACT | Status: DC | PRN

## 2018-06-03 MED ORDER — VITAMIN B-12 1000 MCG PO TABS
1000.0000 ug | ORAL_TABLET | Freq: Every day | ORAL | Status: DC
  Administered 2018-06-03 – 2018-06-05 (×3): 1000 ug via ORAL
  Filled 2018-06-03 (×5): qty 1

## 2018-06-03 MED ORDER — PERIDIN-C 200-50-150 MG PO TABS: 2.0000 | ORAL_TABLET | Freq: Three times a day (TID) | ORAL | Status: DC

## 2018-06-03 MED ORDER — LINACLOTIDE 145 MCG PO CAPS
145.0000 ug | ORAL_CAPSULE | Freq: Every day | ORAL | Status: DC | PRN
  Administered 2018-06-04: 145 ug via ORAL
  Filled 2018-06-03 (×3): qty 1

## 2018-06-03 MED ORDER — SODIUM CHLORIDE 0.9 % IV BOLUS
1000.0000 mL | Freq: Once | INTRAVENOUS | Status: AC
  Administered 2018-06-03: 1000 mL via INTRAVENOUS

## 2018-06-03 MED ORDER — LEUCOVORIN CALCIUM 25 MG PO TABS
125.0000 mg | ORAL_TABLET | ORAL | Status: DC
  Filled 2018-06-03: qty 5

## 2018-06-03 MED ORDER — ONDANSETRON HCL 4 MG/2ML IJ SOLN
4.0000 mg | Freq: Four times a day (QID) | INTRAMUSCULAR | Status: DC | PRN
  Administered 2018-06-03 (×2): 4 mg via INTRAVENOUS
  Filled 2018-06-03: qty 2

## 2018-06-03 MED ORDER — FAMOTIDINE IN NACL 20-0.9 MG/50ML-% IV SOLN
20.0000 mg | Freq: Two times a day (BID) | INTRAVENOUS | Status: DC
  Administered 2018-06-03 – 2018-06-07 (×9): 20 mg via INTRAVENOUS
  Filled 2018-06-03 (×9): qty 50

## 2018-06-03 MED ORDER — MELOXICAM 7.5 MG PO TABS
15.0000 mg | ORAL_TABLET | Freq: Every day | ORAL | Status: DC
  Administered 2018-06-03: 15 mg via ORAL
  Filled 2018-06-03: qty 2

## 2018-06-03 MED ORDER — KETOROLAC TROMETHAMINE 15 MG/ML IJ SOLN
15.0000 mg | Freq: Four times a day (QID) | INTRAMUSCULAR | Status: DC | PRN
  Administered 2018-06-03: 15 mg via INTRAVENOUS
  Filled 2018-06-03: qty 1

## 2018-06-03 MED ORDER — HYDROXYCHLOROQUINE SULFATE 200 MG PO TABS
200.0000 mg | ORAL_TABLET | Freq: Two times a day (BID) | ORAL | Status: DC
  Administered 2018-06-03 – 2018-06-04 (×4): 200 mg via ORAL
  Filled 2018-06-03 (×5): qty 1

## 2018-06-03 MED ORDER — IOHEXOL 300 MG/ML  SOLN
100.0000 mL | Freq: Once | INTRAMUSCULAR | Status: AC | PRN
  Administered 2018-06-03: 100 mL via INTRAVENOUS

## 2018-06-03 MED ORDER — BISACODYL 10 MG RE SUPP
10.0000 mg | Freq: Every day | RECTAL | Status: DC | PRN
  Administered 2018-06-04: 10 mg via RECTAL
  Filled 2018-06-03: qty 1

## 2018-06-03 MED ORDER — ONDANSETRON HCL 4 MG PO TABS: 4.0000 mg | ORAL_TABLET | Freq: Four times a day (QID) | ORAL | Status: DC | PRN

## 2018-06-03 MED ORDER — PIPERACILLIN-TAZOBACTAM 3.375 G IVPB
3.3750 g | Freq: Once | INTRAVENOUS | Status: AC
Start: 2018-06-03 — End: 2018-06-03
  Administered 2018-06-03: 3.375 g via INTRAVENOUS
  Filled 2018-06-03: qty 50

## 2018-06-03 MED ORDER — MORPHINE SULFATE (PF) 4 MG/ML IV SOLN
4.0000 mg | Freq: Once | INTRAVENOUS | Status: AC
Start: 2018-06-03 — End: 2018-06-03
  Administered 2018-06-03: 4 mg via INTRAVENOUS
  Filled 2018-06-03: qty 1

## 2018-06-03 MED ORDER — SODIUM CHLORIDE 0.9 % IV SOLN
INTRAVENOUS | Status: DC
  Administered 2018-06-03 – 2018-06-04 (×4): via INTRAVENOUS

## 2018-06-03 MED ORDER — METHOTREXATE (PF) 25 MG/0.5ML ~~LOC~~ SOAJ: 0.6000 mL | SUBCUTANEOUS | Status: DC

## 2018-06-03 MED ORDER — ASPIRIN EC 81 MG PO TBEC
81.0000 mg | DELAYED_RELEASE_TABLET | Freq: Every day | ORAL | Status: DC
  Administered 2018-06-03 – 2018-06-05 (×3): 81 mg via ORAL
  Filled 2018-06-03 (×4): qty 1

## 2018-06-03 MED ORDER — LORATADINE 10 MG PO TABS
10.0000 mg | ORAL_TABLET | Freq: Every day | ORAL | Status: DC
  Administered 2018-06-03 – 2018-06-05 (×3): 10 mg via ORAL
  Filled 2018-06-03 (×3): qty 1

## 2018-06-03 MED ORDER — MOMETASONE FURO-FORMOTEROL FUM 200-5 MCG/ACT IN AERO
2.0000 | INHALATION_SPRAY | Freq: Two times a day (BID) | RESPIRATORY_TRACT | Status: DC
  Administered 2018-06-03 – 2018-06-15 (×21): 2 via RESPIRATORY_TRACT
  Filled 2018-06-03 (×2): qty 8.8

## 2018-06-03 MED ORDER — MAGNESIUM CITRATE PO SOLN: 1.0000 | Freq: Once | ORAL | Status: DC | PRN

## 2018-06-03 MED ORDER — PROMETHAZINE HCL 25 MG/ML IJ SOLN
12.5000 mg | Freq: Four times a day (QID) | INTRAMUSCULAR | Status: DC | PRN
  Administered 2018-06-04 – 2018-06-05 (×2): 12.5 mg via INTRAVENOUS
  Filled 2018-06-03 (×4): qty 1

## 2018-06-03 MED ORDER — METRONIDAZOLE IN NACL 5-0.79 MG/ML-% IV SOLN: 500.0000 mg | Freq: Once | INTRAVENOUS | Status: DC

## 2018-06-03 MED ORDER — MILK AND MOLASSES ENEMA
1.0000 | Freq: Once | RECTAL | Status: AC
  Administered 2018-06-03: 250 mL via RECTAL
  Filled 2018-06-03: qty 250

## 2018-06-03 MED ORDER — LEUCOVORIN CALCIUM 25 MG PO TABS: 125.0000 mg | ORAL_TABLET | ORAL | Status: DC

## 2018-06-03 MED ORDER — PREGABALIN 75 MG PO CAPS
75.0000 mg | ORAL_CAPSULE | Freq: Two times a day (BID) | ORAL | Status: DC
  Administered 2018-06-03 – 2018-06-05 (×5): 75 mg via ORAL
  Filled 2018-06-03: qty 1
  Filled 2018-06-03 (×5): qty 3

## 2018-06-03 MED ORDER — POTASSIUM CHLORIDE 10 MEQ/100ML IV SOLN
10.0000 meq | Freq: Once | INTRAVENOUS | Status: AC
  Administered 2018-06-03: 10 meq via INTRAVENOUS
  Filled 2018-06-03: qty 100

## 2018-06-03 MED ORDER — ATORVASTATIN CALCIUM 10 MG PO TABS
10.0000 mg | ORAL_TABLET | Freq: Every day | ORAL | Status: DC
  Administered 2018-06-03 – 2018-06-05 (×3): 10 mg via ORAL
  Filled 2018-06-03 (×4): qty 1

## 2018-06-03 MED ORDER — ONDANSETRON HCL 4 MG/2ML IJ SOLN
4.0000 mg | Freq: Once | INTRAMUSCULAR | Status: AC
  Administered 2018-06-03: 4 mg via INTRAVENOUS
  Filled 2018-06-03: qty 2

## 2018-06-03 MED ORDER — VENLAFAXINE HCL 37.5 MG PO TABS
37.5000 mg | ORAL_TABLET | Freq: Two times a day (BID) | ORAL | Status: DC
  Administered 2018-06-03 – 2018-06-05 (×5): 37.5 mg via ORAL
  Filled 2018-06-03 (×6): qty 1

## 2018-06-03 NOTE — ED Notes (Signed)
Admitting at bedside 

## 2018-06-03 NOTE — Progress Notes (Signed)
Patient states she can not do the enema right now but will do later today.

## 2018-06-03 NOTE — ED Notes (Signed)
Patient's husband reports that she is unable to take Cipro because "she'll have projectile vomit."

## 2018-06-03 NOTE — ED Notes (Signed)
Pt is having some results primarily liquid

## 2018-06-03 NOTE — ED Notes (Signed)
The pt has had mostly liquid with some small scattered stool

## 2018-06-03 NOTE — Consult Note (Signed)
Referring Provider: Dr. Lorin Mercy Primary Care Physician:  Kathyrn Lass, MD Primary Gastroenterologist:  Dr. Cristina Gong  Reason for Consultation:  Fecal impaction  HPI: [name] is a 62 y.o. female with chronic constipation on Linzess for over a year presents with fecal impaction. She has been on Linzess 145 mcg/day prn after feeling like the 145 mcg dose was too strong for her compared to the 72 mcg dose. She had not taken any Linzess for a week stating that she has felt too sick to take it. Last BM was Wednesday. Had small amount of stool from an enema in ER. CT showed a fecal impaction and stercoral colitis. Has not been willing to take the enemas today in the hospital and her husband reports that she had vomiting today. Complaining of abdominal pain. Denies rectal bleeding. Reports a normal colonoscopy almost 10 years ago by an outside facility.  Past Medical History:  Diagnosis Date  . Back pain   . Chronic fatigue syndrome    Dr. Sabra Heck  . Cough    /SOB, PFT's nl, improved with Bronchodilation 8/11  . Esophageal spasm    GERD Dr. Sabra Heck, Dr. Wynetta Emery; egd 2006 nl; UGI 3/13 Small HH, moderate GERD, nl motility; seen at [facility] ([place]), egd? neg, sone improvement on sucralfate susp  . Fibromyalgia   . Foot pain    Dr. Rushie Nyhan  . High triglycerides   . Hypothyroidism    Dr. Lewis Shock 4/14  . Leukocytoclastic vasculitis ([place])    Dr. Tonia Brooms  . Low HDL (under 40)   . Mitral valve prolapse   . Paroxysmal atrial tachycardia (HCC)    Dr. Marlou Porch  . Pernicious anemia    Dr. Sabra Heck  . Reflux    ? delayed gastric emptying--GES nl 01/2012 (6% at 2hrs)  . RLS (restless legs syndrome)    low ferritin Dr. Sabra Heck  . Uterine carcinoma (HCC)    Dr. Polly Cobia    Past Surgical History:  Procedure Laterality Date  . ABDOMINAL HYSTERECTOMY    . BUNIONECTOMY    . CHOLECYSTECTOMY    . COLONOSCOPY     scr, 12/2008 ([facility]) nl  . HAMMER TOE SURGERY    . LUNG BIOPSY     L Lower lung pulm nodules,  largest 4.76mm, low risk, repeat CT in 1 yr now following with pulm at [facility]; 9/13 Stable tiny lung nodules, no f/u needed  . METATARSAL OSTEOTOMY    . NECK SURGERY    . US ECHOCARDIOGRAPHY     with Nuclear test, Dr. Marlou Porch, Low risk  . VESICOVAGINAL FISTULA CLOSURE W/ TAH      Prior to Admission medications   Medication Sig Start Date End Date Taking? Authorizing Provider  albuterol (PROVENTIL HFA;VENTOLIN HFA) 108 (90 Base) MCG/ACT inhaler Inhale 2 puffs into the lungs every 4 (four) hours as needed for wheezing or shortness of breath. 06/13/16  Yes [provider]  ALPRAZolam Duanne Moron) 1 MG tablet Take 0.5-1 mg by mouth at bedtime as needed for anxiety or sleep.    Yes [provider]  aspirin EC 81 MG tablet Take 1 tablet (81 mg total) by mouth daily. 03/21/16  Yes Jerline Pain, MD  atorvastatin (LIPITOR) 10 MG tablet Take 1 tablet (10 mg total) by mouth daily. 08/21/17  Yes Jerline Pain, MD  Bioflavonoid Products (PERIDIN-C PO) Take 2 tablets by mouth 3 (three) times daily.    Yes [provider]  Cholecalciferol (VITAMIN D3) 1000 units CAPS Take 2,000 Units  by mouth daily.    Yes [provider]  clindamycin (CLINDAGEL) 1 % gel Apply 1 application topically daily.   Yes [provider]  dexlansoprazole (DEXILANT) 60 MG capsule Take 60 mg by mouth daily.   Yes [provider]  diclofenac sodium (VOLTAREN) 1 % GEL Apply 2 g topically 3 (three) times daily as needed (pain).    Yes [provider]  doxycycline (PERIOSTAT) 20 MG tablet Take 20 mg by mouth 2 (two) times daily.   Yes [provider]  DULoxetine (CYMBALTA) 30 MG capsule Take 90 mg by mouth daily.    Yes [provider]  EPINEPHrine 0.3 mg/0.3 mL IJ SOAJ injection Inject 0.3 mg into the muscle daily as needed (allergic reaction).    Yes [provider]  fexofenadine (ALLEGRA) 180 MG tablet Take 180 mg by mouth daily.   Yes [provider]  fluticasone-salmeterol (ADVAIR HFA) 115-21 MCG/ACT inhaler Inhale 2 puffs into the lungs 2 (two) times daily as needed ((ASTHMA)).    Yes [provider]  hydroxychloroquine (PLAQUENIL) 200 MG tablet Take 200 mg by mouth 2 (two) times daily.   Yes [provider]  leucovorin (WELLCOVORIN) 5 MG tablet Take 25 tablets by mouth every 7 (seven) days. wednesday 11/28/17  Yes [provider]  levothyroxine (SYNTHROID, LEVOTHROID) 150 MCG tablet Take 137 mcg by mouth daily before breakfast.    Yes [provider]  linaclotide (LINZESS) 145 MCG CAPS capsule Take 145 mcg by mouth daily as needed (constipation).    Yes [provider]  meloxicam (MOBIC) 15 MG tablet Take 15 mg by mouth daily.   Yes [provider]  Methotrexate, PF, 25 MG/0.5ML SOAJ Inject 0.6 mLs into the skin once a week. tuesday   Yes [provider]  metoprolol succinate (TOPROL-XL) 25 MG 24 hr tablet Take 1 tablet (25 mg total) by mouth daily. 08/21/17  Yes Jerline Pain, MD  METRONIDAZOLE EX Apply 1 application topically 2 (two) times daily.   Yes [provider]  nitroGLYCERIN (NITROSTAT) 0.4 MG SL tablet Place 1 tablet (0.4 mg total) under the tongue every 5 (five) minutes as needed for chest pain. 12/12/17  Yes Jerline Pain, MD  ondansetron (ZOFRAN) 8 MG tablet Take 8 mg by mouth every 8 (eight) hours as needed for nausea or vomiting.   Yes [provider]  pentosan polysulfate (ELMIRON) 100 MG capsule Take 100 mg by mouth 2 (two) times daily.    Yes [provider]  pregabalin (LYRICA) 75 MG capsule Take 75 mg by mouth 2 (two) times daily. 12/06/17  Yes [provider]  venlafaxine (EFFEXOR) 37.5 MG tablet Take 37.5 mg by mouth 2 (two) times daily.   Yes [provider]  vitamin B-12 (CYANOCOBALAMIN) 1000 MCG tablet Take 1,000 mcg by mouth daily.   Yes [provider]    Scheduled Meds: . aspirin EC   81 mg Oral Daily  . atorvastatin  10 mg Oral Daily  . DULoxetine  90 mg Oral Daily  . enoxaparin (LOVENOX) injection  40 mg Subcutaneous Daily  . hydroxychloroquine  200 mg Oral BID  . [START ON 06/05/2018] leucovorin  125 mg Oral Q7 days  . levothyroxine  137 mcg Oral QAC breakfast  . loratadine  10 mg Oral Daily  . [START ON 06/04/2018] Methotrexate (PF)  0.6 mL Subcutaneous Weekly  . [START ON 06/04/2018] metoprolol succinate  25 mg Oral Daily  . mometasone-formoterol  2 puff Inhalation BID  . pentosan polysulfate  100 mg Oral BID  . pregabalin  75 mg Oral BID  . venlafaxine  37.5 mg Oral BID  . vitamin B-12  1,000 mcg Oral Daily   Continuous Infusions: . sodium chloride 100 mL/hr at 06/03/18 0845  . famotidine (PEPCID) IV 20 mg (06/03/18 1019)  . ondansetron (ZOFRAN) IV     PRN Meds:.acetaminophen **OR** acetaminophen, albuterol, ALPRAZolam, bisacodyl, ketorolac, linaclotide, magnesium citrate, polyethylene glycol, promethazine  Allergies as of 06/03/2018 - Review Complete 06/03/2018  Allergen Reaction Noted  . Ambien [zolpidem tartrate] Other (See Comments) 03/04/2014  . Bee venom Itching, Shortness Of Breath, and Swelling 05/21/1962  . Minocycline  03/04/2014  . Peanut-containing drug products Anaphylaxis 12/12/2016  . Tape Itching, Rash, and Swelling 07/23/2007  . Ciprofloxacin Nausea And Vomiting 01/31/2011  . Latex Rash 01/31/2011  . Shellfish allergy Rash 03/04/2014    History reviewed. No pertinent family history.  Social History   Socioeconomic History  . Marital status: Married    Spouse name: Not on file  . Number of children: Not on file  . Years of education: Not on file  . Highest education level: Not on file  Occupational History  . Not on file  Social Needs  . Financial resource strain: Not on file  . Food insecurity:    Worry: Not on file    Inability: Not on file  . Transportation needs:    Medical: Not on file    Non-medical: Not on file  Tobacco  Use  . Smoking status: Never Smoker  . Smokeless tobacco: Never Used  Substance and Sexual Activity  . Alcohol use: No  . Drug use: No  . Sexual activity: Not on file  Lifestyle  . Physical activity:    Days per week: Not on file    Minutes per session: Not on file  . Stress: Not on file  Relationships  . Social connections:    Talks on phone: Not on file    Gets together: Not on file    Attends religious service: Not on file    Active member of club or organization: Not on file    Attends meetings of clubs or organizations: Not on file    Relationship status: Not on file  . Intimate partner violence:    Fear of current or ex partner: Not on file    Emotionally abused: Not on file    Physically abused: Not on file    Forced sexual activity: Not on file  Other Topics Concern  . Not on file  Social History Narrative  . Not on file    Review of Systems: All negative except as stated above in HPI.  Physical Exam: Vital signs: Vitals:   06/03/18 0830 06/03/18 1336  BP: (!) 149/65 135/60  Pulse: 79 91  Resp: 16 14  Temp: 98.3 F (36.8 C) 98.1 F (36.7 C)  SpO2: 96% 96%   Last BM Date: 06/03/18 General:  Lethargic, mild acute distress, uncomfortable, pleasant Head: normocephalic, atraumatic Eyes: anicteric sclera ENT: oropharynx clear Neck: supple, nontender Lungs:  Clear throughout to auscultation.   No wheezes, crackles, or rhonchi. No acute distress. Heart:  Regular rate and rhythm; no murmurs, clicks, rubs,  or gallops. Abdomen: diffuse tenderness (greatest in LLQ) with guarding, soft, nondistended, +BS  Rectal:  Deferred Ext: no edema  GI:  Lab Results: Recent Labs    06/03/18 0140  WBC 16.6*  HGB 14.1  HCT 41.9  PLT 380   BMET Recent Labs    06/03/18 0140  NA 139  K 3.3*  CL 104  CO2 23  GLUCOSE 165*  BUN 22  CREATININE 0.84  CALCIUM 9.1   LFT Recent Labs    06/03/18 0140  PROT 6.9  ALBUMIN 3.9  AST 20  ALT 21  ALKPHOS 91   BILITOT 1.1   PT/INR No results for input(s): LABPROT, INR in the last 72 hours.   Studies/Results: Ct Abdomen Pelvis W Contrast  Result Date: 06/03/2018 CLINICAL DATA:  Lower abdominal pain x2 days with nausea and vomiting. EXAM: CT ABDOMEN AND PELVIS WITH CONTRAST TECHNIQUE: Multidetector CT imaging of the abdomen and pelvis was performed using the standard protocol following bolus administration of intravenous contrast. CONTRAST:  140mL OMNIPAQUE IOHEXOL 300 MG/ML  SOLN COMPARISON:  10/26/2016 FINDINGS: Lower chest: Normal heart size without pericardial effusion. Subsegmental atelectasis at the lung bases. Small hiatal hernia. Hepatobiliary: Cholecystectomy. No space-occupying mass of the liver. No biliary dilatation. Pancreas: Normal Spleen: Normal Adrenals/Urinary Tract: Normal bilateral adrenal glands. Symmetric cortical enhancement of both kidneys without nephrolithiasis nor obstructive uropathy. The urinary bladder is unremarkable for the degree of distention. Stomach/Bowel: The stomach is decompressed. The duodenal sweep and ligament of Treitz position is normal. Small bowel dilatation or inflammation. The distal and terminal ileum are unremarkable. The appendix is not well visualized. Increased colonic stool burden is noted within the cecum and ascending colon. Additionally there is a large amount of stool with transmural thickening and involving the rectosigmoid suspicious for stercoral colitis. Associated small amount free fluid is seen in the pelvis. Vascular/Lymphatic: No significant vascular findings are present. No enlarged abdominal or pelvic lymph nodes. Reproductive: Status post hysterectomy. No adnexal masses. Other: Small amount of free fluid in the pelvis as described. Musculoskeletal: No acute nor suspicious osseous abnormalities. Lower lumbar facet arthropathy with mild disc flattening at L4-5 and anterolisthesis of L4 on L5. IMPRESSION: Moderate stool retention within the  rectosigmoid with transmural thickening, adjacent small volume of free fluid and inflammation consistent with stercoral colitis. Electronically Signed   By: Ashley Royalty M.D.   On: 06/03/2018 03:54    Impression/Plan: Fecal impaction and stercoral colitis on CT in need of aggressive enemas that after discussing she is willing to receive. If the enemas work, then will consider Miralax bowel prep. I do not think a flex sig is needed but will need an updated colonoscopy this year as an outpt. At discharge needs to do the Linzess 145 mcg/day unless diarrhea occurs and then would use it every other day or every 2 days instead of as needed. Will follow.    LOS: 0 days   [place] C.  06/03/2018, 4:39 PM  Questions please call [phone]

## 2018-06-03 NOTE — ED Provider Notes (Signed)
[facility] EMERGENCY DEPARTMENT Provider Note   CSN: [id number] Arrival date & time: 06/03/18  0115     History   Chief Complaint Chief Complaint  Patient presents with  . Abdominal Pain    HPI [name] is a 62 y.o. female.  Patient is a 62 year old female with past medical history of GERD, chronic fatigue, mitral valve prolapse.  She presents today for evaluation of abdominal pain.  This is been ongoing for the past 3 days and worsening.  Her pain is across the lower abdomen in the left lower quadrant, right lower quadrant, and suprapubic region.  The pain is constant and worse with movement and change in position and palpation.  She states that she has not had a bowel movement in several days.  She tried an enema with no relief.  Prior surgical history includes cholecystectomy and total hysterectomy.  The history is provided by the patient.  Abdominal Pain   This is a new problem. Episode onset: 3 days ago. The problem occurs constantly. The problem has been gradually worsening. The pain is associated with an unknown factor. The pain is located in the RLQ, LLQ and suprapubic region. The quality of the pain is cramping. The pain is moderate. Associated symptoms include nausea, vomiting and constipation. Pertinent negatives include fever, diarrhea and hematochezia. Nothing relieves the symptoms.    Past Medical History:  Diagnosis Date  . Back pain   . Chronic fatigue syndrome    Dr. Sabra Heck  . Cough    /SOB, PFT's nl, improved with Bronchodilation 8/11  . Esophageal spasm    GERD Dr. Sabra Heck, Dr. Wynetta Emery; egd 2006 nl; UGI 3/13 Small HH, moderate GERD, nl motility; seen at [facility] ([place]), egd? neg, sone improvement on sucralfate susp  . Foot pain    Dr. Rushie Nyhan  . High triglycerides   . Hypothyroidism    Dr. Lewis Shock 4/14  . Leukocytoclastic vasculitis ([place])    Dr. Tonia Brooms  . Low HDL (under 40)   . Paroxysmal atrial tachycardia (HCC)    Dr. Marlou Porch    . Pernicious anemia    Dr. Sabra Heck  . Reflux    ? delayed gastric emptying--GES nl 01/2012 (6% at 2hrs)  . RLS (restless legs syndrome)    low ferritin Dr. Sabra Heck  . Uterine carcinoma ([place])    Dr. Polly Cobia    Patient Active Problem List   Diagnosis Date Noted  . Orthostatic hypotension 03/22/2015  . Paroxysmal atrial tachycardia [facility])     Past Surgical History:  Procedure Laterality Date  . BUNIONECTOMY    . COLONOSCOPY     scr, 12/2008 ([facility]) nl  . HAMMER TOE SURGERY    . LUNG BIOPSY     L Lower lung pulm nodules, largest 4.58mm, low risk, repeat CT in 1 yr now following with pulm at [facility]; 9/13 Stable tiny lung nodules, no f/u needed  . METATARSAL OSTEOTOMY    . US ECHOCARDIOGRAPHY     with Nuclear test, Dr. Marlou Porch, Low risk     OB History   None      Home Medications    Prior to Admission medications   Medication Sig Start Date End Date Taking? Authorizing Provider  albuterol (PROVENTIL HFA;VENTOLIN HFA) 108 (90 Base) MCG/ACT inhaler Inhale 2 puffs into the lungs every 4 (four) hours as needed for wheezing or shortness of breath. 06/13/16   [provider]  ALPRAZolam Duanne Moron) 1 MG tablet Take 0.5 mg by mouth daily.  [provider]  aspirin EC 81 MG tablet Take 1 tablet (81 mg total) by mouth daily. 03/21/16   Jerline Pain, MD  atorvastatin (LIPITOR) 10 MG tablet Take 1 tablet (10 mg total) by mouth daily. 08/21/17   Jerline Pain, MD  Bioflavonoid Products (PERIDIN-C PO) Take 200 mg by mouth daily.    [provider]  buPROPion (ZYBAN) 150 MG 12 hr tablet Take 450 mg by mouth every morning.    [provider]  Cholecalciferol (VITAMIN D3) 1000 units CAPS Take 2,000 Units by mouth daily.     [provider]  dexlansoprazole (DEXILANT) 60 MG capsule Take 60 mg by mouth daily.    [provider]  diclofenac sodium (VOLTAREN) 1 % GEL Apply 2 g topically 3 (three) times daily.    [provider]  Digestive  Enzymes (ENZYMATIC DIGESTANT PO) Take 1 tablet by mouth 2 (two) times daily.    [provider]  doxycycline (PERIOSTAT) 20 MG tablet Take 20 mg by mouth 2 (two) times daily.    [provider]  DULoxetine (CYMBALTA) 30 MG capsule Take 90 mg by mouth daily.     [provider]  EPINEPHrine 0.3 mg/0.3 mL IJ SOAJ injection Inject 0.3 mg into the muscle as needed (allergies).     [provider]  fexofenadine (ALLEGRA) 180 MG tablet Take 180 mg by mouth daily.    [provider]  fluticasone-salmeterol (ADVAIR HFA) 115-21 MCG/ACT inhaler Inhale 2 puffs into the lungs 2 (two) times daily as needed ((ASTHMA)).     [provider]  hydroxychloroquine (PLAQUENIL) 200 MG tablet Take 200 mg by mouth 2 (two) times daily.    [provider]  leucovorin (WELLCOVORIN) 5 MG tablet Take 5 tablets by mouth every 7 (seven) days. 11/28/17   [provider]  levothyroxine (SYNTHROID, LEVOTHROID) 150 MCG tablet Take 150 mcg by mouth daily before breakfast.    [provider]  linaclotide (LINZESS) 145 MCG CAPS capsule Take 145 mcg by mouth daily before breakfast.    [provider]  meloxicam (MOBIC) 15 MG tablet Take 15 mg by mouth daily.    [provider]  Methotrexate, PF, 25 MG/0.5ML SOAJ Inject 1 mL into the skin once a week.    [provider]  metoprolol succinate (TOPROL-XL) 25 MG 24 hr tablet Take 1 tablet (25 mg total) by mouth daily. 08/21/17   Jerline Pain, MD  mirabegron ER (MYRBETRIQ) 50 MG TB24 tablet Take 50 mg by mouth daily.    [provider]  nitroGLYCERIN (NITROSTAT) 0.4 MG SL tablet Place 1 tablet (0.4 mg total) under the tongue every 5 (five) minutes as needed for chest pain. 12/12/17   Jerline Pain, MD  NONFORMULARY OR COMPOUNDED ITEM Apply 1 application topically at bedtime. azaleic acid 15%/metronidazole 1%/ivermectin 1%    [provider]  ondansetron (ZOFRAN) 8 MG  tablet Take 8 mg by mouth every 8 (eight) hours as needed for nausea or vomiting.    [provider]  pentosan polysulfate (ELMIRON) 100 MG capsule Take 100 mg by mouth daily.     [provider]  pregabalin (LYRICA) 75 MG capsule Take 75 mg by mouth 2 (two) times daily. 12/06/17   [provider]  ranitidine (ZANTAC) 150 MG tablet Take 150 mg by mouth at bedtime.    [provider]  vitamin B-12 (CYANOCOBALAMIN) 1000 MCG tablet Take 1,000 mcg by mouth daily.  [provider]    Family History No family history on file.  Social History Social History   Tobacco Use  . Smoking status: Never Smoker  . Smokeless tobacco: Never Used  Substance Use Topics  . Alcohol use: No  . Drug use: No     Allergies   Ambien [zolpidem tartrate]; Bee venom; Minocycline; Peanut-containing drug products; Tape; Ciprofloxacin; Latex; and Shellfish allergy   Review of Systems Review of Systems  Constitutional: Negative for fever.  Gastrointestinal: Positive for abdominal pain, constipation, nausea and vomiting. Negative for diarrhea and hematochezia.  All other systems reviewed and are negative.    Physical Exam Updated Vital Signs BP (!) 157/69 (BP Location: Left Arm)   Pulse 74   Temp 98.7 F (37.1 C) (Oral)   Resp 16   Ht 5\' 5"  (1.651 m)   Wt 74.8 kg (165 lb)   SpO2 97%   BMI 27.46 kg/m   Physical Exam  Constitutional: She is oriented to person, place, and time. She appears well-developed and well-nourished. No distress.  HENT:  Head: Normocephalic and atraumatic.  Neck: Normal range of motion. Neck supple.  Cardiovascular: Normal rate and regular rhythm. Exam reveals no gallop and no friction rub.  No murmur heard. Pulmonary/Chest: Effort normal and breath sounds normal. No respiratory distress. She has no wheezes.  Abdominal: Soft. Bowel sounds are normal. She exhibits no distension. There is tenderness in the right lower quadrant,  suprapubic area and left lower quadrant. There is no rigidity, no rebound and no guarding.  Musculoskeletal: Normal range of motion.  Neurological: She is alert and oriented to person, place, and time.  Skin: Skin is warm and dry. She is not diaphoretic.  Nursing note and vitals reviewed.    ED Treatments / Results  Labs (all labs ordered are listed, but only abnormal results are displayed) Labs Reviewed  LIPASE, BLOOD  COMPREHENSIVE METABOLIC PANEL  CBC  URINALYSIS, ROUTINE W REFLEX MICROSCOPIC    EKG None  Radiology No results found.  Procedures Procedures (including critical care time)  Medications Ordered in ED Medications  ondansetron (ZOFRAN) injection 4 mg (has no administration in time range)  ondansetron (ZOFRAN) injection 4 mg (has no administration in time range)  sodium chloride 0.9 % bolus 1,000 mL (has no administration in time range)  morphine 4 MG/ML injection 4 mg (has no administration in time range)     Initial Impression / Assessment and Plan / ED Course  I have reviewed the triage vital signs and the nursing notes.  Pertinent labs & imaging results that were available during my care of the patient were reviewed by me and considered in my medical decision making (see chart for details).  Patient presenting with a 3-day history of lower abdominal pain.  She has a white count of 17,000 and CT scan shows stercoral colitis.  She was given a soapsuds enema, however was unable to produce a bowel movement.  The stool is not amenable to manual disimpaction due to location.  She was given Cipro and Flagyl and will be admitted to the hospitalist service under the care of Dr. Myna Hidalgo.  Final Clinical Impressions(s) / ED Diagnoses   Final diagnoses:  None    ED Discharge Orders    None       Veryl Speak, MD 06/03/18 [id number]

## 2018-06-03 NOTE — ED Triage Notes (Signed)
Patient c/o lower abd pain x2 days with nausea/vomiting. Last BM was Wednesday.

## 2018-06-03 NOTE — H&P (Addendum)
History and Physical    [name] WRU:[id number] DOB: 12-18-1955 DOA: 06/03/2018   PCP: Kathyrn Lass, MD   Patient coming from:  Home    Chief Complaint: Abdominal pain   HPI: [name] is a 62 y.o. female with medical history significant for chronic fatigue syndrome, history of GERD, with last with last EGD 2013, normal, history of upper GI small hiatal hernia, history of irritable bowel syndrome followed by Dr. Cristina Gong, hypothyroidism, hyperlipidemia, history of pernicious anemia, restless leg syndrome, depression, feels like a site of vasculitis, presenting with 3-day history of acute worsening, cramping abdominal pain, diffuse, tender to light palpation, extending to the suprapubic area.  The patient states that has not had a bowel movement since Sunday prior to sending to the hospital.  She tried an enema with no relief.  No recent surgeries, but she did have a cholecystectomy a hysterectomy in the past.  She denies any fever, chills, hematochezia, but she does have nausea, and vomiting associated with this.   Denies any respiratory complaints. Denies any chest pain or palpitations. Denies lower extremity swelling.  Denies any dysuria. Denies abnormal skin rashes, or neuropathy. Denies any bleeding issues such as epistaxis, hematemesis, hematuria or hematochezia. Ambulating without difficulty but admits has not been walking frequently or drinking enough water   ED Course:  BP (!) 149/65 (BP Location: Left Arm)   Pulse 79   Temp 98.3 F (36.8 C) (Oral)   Resp 16   Ht 5\' 5"  (1.651 m)   Wt 75.3 kg (166 lb 0.1 oz)   SpO2 96%   BMI 27.62 kg/m   Received Zofran, IV fluids, and also received 1 dose of morphine. WBC 17 Potassium is 3.3  Glucose 165 Lipase 42 CT scan of the abdomen and pelvis shows stercoral colitis.  She was given a soapsuds enema, but was unable to produce a bowel movement.  The stool was not amenable to manual disimpaction due to location. Patient has been given  Cipro and Flagyl, and is going to be admitted for further management.  Review of Systems:  As per HPI otherwise all other systems reviewed and are negative  Past Medical History:  Diagnosis Date  . Back pain   . Chronic fatigue syndrome    Dr. Sabra Heck  . Cough    /SOB, PFT's nl, improved with Bronchodilation 8/11  . Esophageal spasm    GERD Dr. Sabra Heck, Dr. Wynetta Emery; egd 2006 nl; UGI 3/13 Small HH, moderate GERD, nl motility; seen at [facility] ([place]), egd? neg, sone improvement on sucralfate susp  . Fibromyalgia   . Foot pain    Dr. Rushie Nyhan  . High triglycerides   . Hypothyroidism    Dr. Lewis Shock 4/14  . Leukocytoclastic vasculitis ([place])    Dr. Tonia Brooms  . Low HDL (under 40)   . Mitral valve prolapse   . Paroxysmal atrial tachycardia (HCC)    Dr. Marlou Porch  . Pernicious anemia    Dr. Sabra Heck  . Reflux    ? delayed gastric emptying--GES nl 01/2012 (6% at 2hrs)  . RLS (restless legs syndrome)    low ferritin Dr. Sabra Heck  . Uterine carcinoma (HCC)    Dr. Polly Cobia    Past Surgical History:  Procedure Laterality Date  . ABDOMINAL HYSTERECTOMY    . BUNIONECTOMY    . CHOLECYSTECTOMY    . COLONOSCOPY     scr, 12/2008 ([facility]) nl  . HAMMER TOE SURGERY    . LUNG BIOPSY  L Lower lung pulm nodules, largest 4.47mm, low risk, repeat CT in 1 yr now following with pulm at [facility]; 9/13 Stable tiny lung nodules, no f/u needed  . METATARSAL OSTEOTOMY    . NECK SURGERY    . US ECHOCARDIOGRAPHY     with Nuclear test, Dr. Marlou Porch, Low risk  . VESICOVAGINAL FISTULA CLOSURE W/ TAH      Social History Social History   Socioeconomic History  . Marital status: Married    Spouse name: Not on file  . Number of children: Not on file  . Years of education: Not on file  . Highest education level: Not on file  Occupational History  . Not on file  Social Needs  . Financial resource strain: Not on file  . Food insecurity:    Worry: Not on file    Inability: Not on file  . Transportation needs:     Medical: Not on file    Non-medical: Not on file  Tobacco Use  . Smoking status: Never Smoker  . Smokeless tobacco: Never Used  Substance and Sexual Activity  . Alcohol use: No  . Drug use: No  . Sexual activity: Not on file  Lifestyle  . Physical activity:    Days per week: Not on file    Minutes per session: Not on file  . Stress: Not on file  Relationships  . Social connections:    Talks on phone: Not on file    Gets together: Not on file    Attends religious service: Not on file    Active member of club or organization: Not on file    Attends meetings of clubs or organizations: Not on file    Relationship status: Not on file  . Intimate partner violence:    Fear of current or ex partner: Not on file    Emotionally abused: Not on file    Physically abused: Not on file    Forced sexual activity: Not on file  Other Topics Concern  . Not on file  Social History Narrative  . Not on file     Allergies  Allergen Reactions  . Ambien [Zolpidem Tartrate] Other (See Comments)    Hallucinations  . Bee Venom Itching, Shortness Of Breath and Swelling  . Minocycline     Rash  . Peanut-Containing Drug Products Anaphylaxis  . Tape Itching, Rash and Swelling  . Ciprofloxacin Nausea And Vomiting  . Latex Rash  . Shellfish Allergy Rash    History reviewed. No pertinent family history.     Prior to Admission medications   Medication Sig Start Date End Date Taking? Authorizing Provider  albuterol (PROVENTIL HFA;VENTOLIN HFA) 108 (90 Base) MCG/ACT inhaler Inhale 2 puffs into the lungs every 4 (four) hours as needed for wheezing or shortness of breath. 06/13/16   [provider]  ALPRAZolam Duanne Moron) 1 MG tablet Take 0.5 mg by mouth daily.     [provider]  aspirin EC 81 MG tablet Take 1 tablet (81 mg total) by mouth daily. 03/21/16   Jerline Pain, MD  atorvastatin (LIPITOR) 10 MG tablet Take 1 tablet (10 mg total) by mouth daily. 08/21/17   Jerline Pain, MD    Bioflavonoid Products (PERIDIN-C PO) Take 200 mg by mouth daily.    [provider]  buPROPion (ZYBAN) 150 MG 12 hr tablet Take 450 mg by mouth every morning.    [provider]  Cholecalciferol (VITAMIN D3) 1000 units CAPS Take 2,000 Units  by mouth daily.     [provider]  dexlansoprazole (DEXILANT) 60 MG capsule Take 60 mg by mouth daily.    [provider]  diclofenac sodium (VOLTAREN) 1 % GEL Apply 2 g topically 3 (three) times daily.    [provider]  Digestive Enzymes (ENZYMATIC DIGESTANT PO) Take 1 tablet by mouth 2 (two) times daily.    [provider]  doxycycline (PERIOSTAT) 20 MG tablet Take 20 mg by mouth 2 (two) times daily.    [provider]  DULoxetine (CYMBALTA) 30 MG capsule Take 90 mg by mouth daily.     [provider]  EPINEPHrine 0.3 mg/0.3 mL IJ SOAJ injection Inject 0.3 mg into the muscle as needed (allergies).     [provider]  fexofenadine (ALLEGRA) 180 MG tablet Take 180 mg by mouth daily.    [provider]  fluticasone-salmeterol (ADVAIR HFA) 115-21 MCG/ACT inhaler Inhale 2 puffs into the lungs 2 (two) times daily as needed ((ASTHMA)).     [provider]  hydroxychloroquine (PLAQUENIL) 200 MG tablet Take 200 mg by mouth 2 (two) times daily.    [provider]  leucovorin (WELLCOVORIN) 5 MG tablet Take 5 tablets by mouth every 7 (seven) days. 11/28/17   [provider]  levothyroxine (SYNTHROID, LEVOTHROID) 150 MCG tablet Take 150 mcg by mouth daily before breakfast.    [provider]  linaclotide (LINZESS) 145 MCG CAPS capsule Take 145 mcg by mouth daily before breakfast.    [provider]  meloxicam (MOBIC) 15 MG tablet Take 15 mg by mouth daily.    [provider]  Methotrexate, PF, 25 MG/0.5ML SOAJ Inject 1 mL into the skin once a week.    [provider]  metoprolol succinate (TOPROL-XL) 25 MG 24 hr  tablet Take 1 tablet (25 mg total) by mouth daily. 08/21/17   Jerline Pain, MD  mirabegron ER (MYRBETRIQ) 50 MG TB24 tablet Take 50 mg by mouth daily.    [provider]  nitroGLYCERIN (NITROSTAT) 0.4 MG SL tablet Place 1 tablet (0.4 mg total) under the tongue every 5 (five) minutes as needed for chest pain. 12/12/17   Jerline Pain, MD  NONFORMULARY OR COMPOUNDED ITEM Apply 1 application topically at bedtime. azaleic acid 15%/metronidazole 1%/ivermectin 1%    [provider]  ondansetron (ZOFRAN) 8 MG tablet Take 8 mg by mouth every 8 (eight) hours as needed for nausea or vomiting.    [provider]  pentosan polysulfate (ELMIRON) 100 MG capsule Take 100 mg by mouth daily.     [provider]  pregabalin (LYRICA) 75 MG capsule Take 75 mg by mouth 2 (two) times daily. 12/06/17   [provider]  ranitidine (ZANTAC) 150 MG tablet Take 150 mg by mouth at bedtime.    [provider]  vitamin B-12 (CYANOCOBALAMIN) 1000 MCG tablet Take 1,000 mcg by mouth daily.    [provider]     Physical Exam:  Vitals:   06/03/18 4696 06/03/18 0808 06/03/18 0809 06/03/18 0830  BP:    (!) 149/65  Pulse: 81 79 80 79  Resp:    16  Temp:    98.3 F (36.8 C)  TempSrc:    Oral  SpO2: 100% 99% 95% 96%  Weight:    75.3 kg (166 lb 0.1 oz)  Height:    5\' 5"  (1.651 m)   Constitutional: NAD, but very uncomfortable due to abdominal pain  Eyes: PERRL,  lids and conjunctivae normal ENMT: Mucous membranes are dry without exudate or lesions  Neck: normal, supple, no masses, no thyromegaly Respiratory: clear to auscultation bilaterally, no wheezing, no crackles. Normal respiratory effort  Cardiovascular: Regular rate and rhythm,  murmur, rubs or gallops. No extremity edema. 2+ pedal pulses. No carotid bruits.  Abdomen: distended, diffusely tender to light palpation  No hepatosplenomegaly. Bowel sounds positive.  Musculoskeletal: no clubbing / cyanosis.  Moves all extremities Skin: no jaundice, No lesions.  Neurologic: Sensation intact  Strength equal in all extremities Psychiatric:   Alert and oriented x 3. Tearful due to pain     Labs on Admission: I have personally reviewed following labs and imaging studies  CBC: Recent Labs  Lab 06/03/18 0140  WBC 16.6*  HGB 14.1  HCT 41.9  MCV 91.9  PLT 681    Basic Metabolic Panel: Recent Labs  Lab 06/03/18 0140  NA 139  K 3.3*  CL 104  CO2 23  GLUCOSE 165*  BUN 22  CREATININE 0.84  CALCIUM 9.1    GFR: Estimated Creatinine Clearance: 69.6 mL/min (by C-G formula based on SCr of 0.84 mg/dL).  Liver Function Tests: Recent Labs  Lab 06/03/18 0140  AST 20  ALT 21  ALKPHOS 91  BILITOT 1.1  PROT 6.9  ALBUMIN 3.9   Recent Labs  Lab 06/03/18 0140  LIPASE 42   No results for input(s): AMMONIA in the last 168 hours.  Coagulation Profile: No results for input(s): INR, PROTIME in the last 168 hours.  Cardiac Enzymes: No results for input(s): CKTOTAL, CKMB, CKMBINDEX, TROPONINI in the last 168 hours.  BNP (last 3 results) No results for input(s): PROBNP in the last 8760 hours.  HbA1C: No results for input(s): HGBA1C in the last 72 hours.  CBG: No results for input(s): GLUCAP in the last 168 hours.  Lipid Profile: No results for input(s): CHOL, HDL, LDLCALC, TRIG, CHOLHDL, LDLDIRECT in the last 72 hours.  Thyroid Function Tests: No results for input(s): TSH, T4TOTAL, FREET4, T3FREE, THYROIDAB in the last 72 hours.  Anemia Panel: No results for input(s): VITAMINB12, FOLATE, FERRITIN, TIBC, IRON, RETICCTPCT in the last 72 hours.  Urine analysis: No results found for: COLORURINE, APPEARANCEUR, LABSPEC, PHURINE, GLUCOSEU, HGBUR, BILIRUBINUR, KETONESUR, PROTEINUR, UROBILINOGEN, NITRITE, LEUKOCYTESUR  Sepsis Labs: @LABRCNTIP (procalcitonin:4,lacticidven:4) )No results found for this or any previous visit (from the past 240 hour(s)).   Radiological Exams on  Admission: Ct Abdomen Pelvis W Contrast  Result Date: 06/03/2018 CLINICAL DATA:  Lower abdominal pain x2 days with nausea and vomiting. EXAM: CT ABDOMEN AND PELVIS WITH CONTRAST TECHNIQUE: Multidetector CT imaging of the abdomen and pelvis was performed using the standard protocol following bolus administration of intravenous contrast. CONTRAST:  114mL OMNIPAQUE IOHEXOL 300 MG/ML  SOLN COMPARISON:  10/26/2016 FINDINGS: Lower chest: Normal heart size without pericardial effusion. Subsegmental atelectasis at the lung bases. Small hiatal hernia. Hepatobiliary: Cholecystectomy. No space-occupying mass of the liver. No biliary dilatation. Pancreas: Normal Spleen: Normal Adrenals/Urinary Tract: Normal bilateral adrenal glands. Symmetric cortical enhancement of both kidneys without nephrolithiasis nor obstructive uropathy. The urinary bladder is unremarkable for the degree of distention. Stomach/Bowel: The stomach is decompressed. The duodenal sweep and ligament of Treitz position is normal. Small bowel dilatation or inflammation. The distal and terminal ileum are unremarkable. The appendix is not well visualized. Increased colonic stool burden is noted within the cecum and ascending colon. Additionally there is a large amount of stool with transmural thickening and involving the rectosigmoid suspicious for stercoral colitis.  Associated small amount free fluid is seen in the pelvis. Vascular/Lymphatic: No significant vascular findings are present. No enlarged abdominal or pelvic lymph nodes. Reproductive: Status post hysterectomy. No adnexal masses. Other: Small amount of free fluid in the pelvis as described. Musculoskeletal: No acute nor suspicious osseous abnormalities. Lower lumbar facet arthropathy with mild disc flattening at L4-5 and anterolisthesis of L4 on L5. IMPRESSION: Moderate stool retention within the rectosigmoid with transmural thickening, adjacent small volume of free fluid and inflammation consistent  with stercoral colitis. Electronically Signed   By: Ashley Royalty M.D.   On: 06/03/2018 03:54    EKG: Independently reviewed.  Assessment/Plan Principal Problem:   Fecal impaction (HCC) Active Problems:   Enterocolitis   Asthma   Chronic fatigue syndrome   Hypothyroidism   Pernicious anemia   Vasculitis of skin    Fecal impaction with severe abdominal pain due to stercoral colitis. History of  IBS, chronic constipation on Linzess not taken for 1 week , no history of bowel obstruction. Last bowel movement 3 days ago. CT scan of the abdomen and pelvis shows stercoral colitis.WBC 17. Lipase 42  She was given a soapsuds enema, but was unable to produce a bowel movement.  The stool was not amenable to manual disimpaction due to location. Had one warm enema with minimal amount of stool.  Received Zofran, 1 L  IV fluids, and also received 1 dose of morphine. Patient has been given Cipro and Flagyl, and is going to be admitted for further management.  Admit to floor/Obs  Liquid diet  Lactic acid IVF at 100 cc/h prn Zofran   Limited IV narcotics for pain to prevent worsening constipation  Encourage mobilization Continue Zosyn order for intrabdominal coverage  Blood cultures Follow CBC daily Lactic acid  GI consult was called, Dr. Michail Sermon . He recommended Warm water enema x2 today for constipation and will see patient this afternoon. Low suspicion for invasive procedures  Resume Linzess   Hypothyroidism: Continue home Synthroid  History of vasculitis of the skin, followed by dermatology.  The patient is on methotrexate ,leucovorin weekly, plaquenil Resume MTX and Leucovorin weekly on 8/6 and 8/7 respectively Resume plaquenil  History of cystitis, on Elmiron Continue Elmiron Check UA   Anxiety and Depression Continue Xanax for anxiety and Cymbalta, Effexor  for depression   Restless leg syndrome Continue Lyrica    Hypertension BP 149/65  Pulse 79   Continue home  anti-hypertensive medications   Hyperlipidemia Continue home statins   Elevated Glucose Patient noted to have elevated Glucose in labs at 165 at NPO  . No prior documented history of DM Check A1C Check CBG bid   Hypokalemia, may be due  Current K 3.3, likely dietary  Replenish KCL x2 runs  Repeat BMET in am    DVT prophylaxis:  Lovenox Code Status:    FUll  Family Communication:  Discussed with patient Disposition Plan: Expect patient to be discharged to home after condition improves Consults called:    GI, Dr. Michail Sermon  Admission status:  Obs [place]    Sharene Butters, PA-C [facility]   Amion text  [phone]   06/03/2018, 9:26 AM

## 2018-06-03 NOTE — Progress Notes (Signed)
Patient states she feels nauseous. Will administer prn zofran. Patient still not willing to do enema.

## 2018-06-03 NOTE — Progress Notes (Signed)
Received patient from ED. Patient independently ambulated to BR from wheelchair and to bed. Patient instructed on use of call bell and room phone. Patient resting in bed. Will continue to monitor.

## 2018-06-03 NOTE — Progress Notes (Addendum)
Patient will not take Miralax, she states it makes her very sick.

## 2018-06-04 DIAGNOSIS — J45909 Unspecified asthma, uncomplicated: Secondary | ICD-10-CM | POA: Diagnosis not present

## 2018-06-04 DIAGNOSIS — Z4682 Encounter for fitting and adjustment of non-vascular catheter: Secondary | ICD-10-CM | POA: Diagnosis not present

## 2018-06-04 DIAGNOSIS — L959 Vasculitis limited to the skin, unspecified: Secondary | ICD-10-CM | POA: Diagnosis not present

## 2018-06-04 DIAGNOSIS — K562 Volvulus: Secondary | ICD-10-CM | POA: Diagnosis not present

## 2018-06-04 DIAGNOSIS — E039 Hypothyroidism, unspecified: Secondary | ICD-10-CM | POA: Diagnosis not present

## 2018-06-04 DIAGNOSIS — Z7982 Long term (current) use of aspirin: Secondary | ICD-10-CM | POA: Diagnosis not present

## 2018-06-04 DIAGNOSIS — Z79890 Hormone replacement therapy (postmenopausal): Secondary | ICD-10-CM | POA: Diagnosis not present

## 2018-06-04 DIAGNOSIS — M797 Fibromyalgia: Secondary | ICD-10-CM | POA: Diagnosis present

## 2018-06-04 DIAGNOSIS — K559 Vascular disorder of intestine, unspecified: Secondary | ICD-10-CM | POA: Diagnosis not present

## 2018-06-04 DIAGNOSIS — N179 Acute kidney failure, unspecified: Secondary | ICD-10-CM | POA: Diagnosis not present

## 2018-06-04 DIAGNOSIS — R1084 Generalized abdominal pain: Secondary | ICD-10-CM | POA: Diagnosis not present

## 2018-06-04 DIAGNOSIS — Z79899 Other long term (current) drug therapy: Secondary | ICD-10-CM | POA: Diagnosis not present

## 2018-06-04 DIAGNOSIS — R109 Unspecified abdominal pain: Secondary | ICD-10-CM | POA: Diagnosis not present

## 2018-06-04 DIAGNOSIS — K529 Noninfective gastroenteritis and colitis, unspecified: Secondary | ICD-10-CM | POA: Diagnosis not present

## 2018-06-04 DIAGNOSIS — K631 Perforation of intestine (nontraumatic): Secondary | ICD-10-CM | POA: Diagnosis not present

## 2018-06-04 DIAGNOSIS — E8770 Fluid overload, unspecified: Secondary | ICD-10-CM | POA: Diagnosis not present

## 2018-06-04 DIAGNOSIS — F329 Major depressive disorder, single episode, unspecified: Secondary | ICD-10-CM | POA: Diagnosis not present

## 2018-06-04 DIAGNOSIS — R5382 Chronic fatigue, unspecified: Secondary | ICD-10-CM | POA: Diagnosis present

## 2018-06-04 DIAGNOSIS — E876 Hypokalemia: Secondary | ICD-10-CM | POA: Diagnosis not present

## 2018-06-04 DIAGNOSIS — Z91040 Latex allergy status: Secondary | ICD-10-CM | POA: Diagnosis not present

## 2018-06-04 DIAGNOSIS — D72829 Elevated white blood cell count, unspecified: Secondary | ICD-10-CM | POA: Diagnosis not present

## 2018-06-04 DIAGNOSIS — Z881 Allergy status to other antibiotic agents status: Secondary | ICD-10-CM | POA: Diagnosis not present

## 2018-06-04 DIAGNOSIS — D510 Vitamin B12 deficiency anemia due to intrinsic factor deficiency: Secondary | ICD-10-CM | POA: Diagnosis not present

## 2018-06-04 DIAGNOSIS — K5289 Other specified noninfective gastroenteritis and colitis: Secondary | ICD-10-CM | POA: Diagnosis not present

## 2018-06-04 DIAGNOSIS — K5641 Fecal impaction: Secondary | ICD-10-CM | POA: Diagnosis not present

## 2018-06-04 DIAGNOSIS — Z91013 Allergy to seafood: Secondary | ICD-10-CM | POA: Diagnosis not present

## 2018-06-04 DIAGNOSIS — K55069 Acute infarction of intestine, part and extent unspecified: Secondary | ICD-10-CM | POA: Diagnosis not present

## 2018-06-04 DIAGNOSIS — Z791 Long term (current) use of non-steroidal anti-inflammatories (NSAID): Secondary | ICD-10-CM | POA: Diagnosis not present

## 2018-06-04 DIAGNOSIS — Z91010 Allergy to peanuts: Secondary | ICD-10-CM | POA: Diagnosis not present

## 2018-06-04 DIAGNOSIS — K659 Peritonitis, unspecified: Secondary | ICD-10-CM | POA: Diagnosis not present

## 2018-06-04 DIAGNOSIS — Z888 Allergy status to other drugs, medicaments and biological substances status: Secondary | ICD-10-CM | POA: Diagnosis not present

## 2018-06-04 DIAGNOSIS — Z90710 Acquired absence of both cervix and uterus: Secondary | ICD-10-CM | POA: Diagnosis not present

## 2018-06-04 DIAGNOSIS — K5900 Constipation, unspecified: Secondary | ICD-10-CM | POA: Diagnosis not present

## 2018-06-04 DIAGNOSIS — Z91030 Bee allergy status: Secondary | ICD-10-CM | POA: Diagnosis not present

## 2018-06-04 DIAGNOSIS — K9189 Other postprocedural complications and disorders of digestive system: Secondary | ICD-10-CM | POA: Diagnosis not present

## 2018-06-04 DIAGNOSIS — I739 Peripheral vascular disease, unspecified: Secondary | ICD-10-CM | POA: Diagnosis not present

## 2018-06-04 DIAGNOSIS — K567 Ileus, unspecified: Secondary | ICD-10-CM | POA: Diagnosis not present

## 2018-06-04 LAB — DIFFERENTIAL
Abs Immature Granulocytes: 0.2 10*3/uL — ABNORMAL HIGH (ref 0.0–0.1)
BASOS ABS: 0.1 10*3/uL (ref 0.0–0.1)
BASOS PCT: 0 %
Eosinophils Absolute: 0 10*3/uL (ref 0.0–0.7)
Eosinophils Relative: 0 %
Immature Granulocytes: 1 %
LYMPHS PCT: 2 %
Lymphs Abs: 0.6 10*3/uL — ABNORMAL LOW (ref 0.7–4.0)
MONO ABS: 1.6 10*3/uL — AB (ref 0.1–1.0)
MONOS PCT: 5 %
NEUTROS PCT: 92 %
Neutro Abs: 29 10*3/uL — ABNORMAL HIGH (ref 1.7–7.7)

## 2018-06-04 LAB — CBC
HEMATOCRIT: 36.6 % (ref 36.0–46.0)
Hemoglobin: 12.5 g/dL (ref 12.0–15.0)
MCH: 31 pg (ref 26.0–34.0)
MCHC: 34.2 g/dL (ref 30.0–36.0)
MCV: 90.8 fL (ref 78.0–100.0)
Platelets: 334 10*3/uL (ref 150–400)
RBC: 4.03 MIL/uL (ref 3.87–5.11)
RDW: 11.7 % (ref 11.5–15.5)
WBC: 31.6 10*3/uL — ABNORMAL HIGH (ref 4.0–10.5)

## 2018-06-04 LAB — BASIC METABOLIC PANEL
Anion gap: 12 (ref 5–15)
BUN: 27 mg/dL — AB (ref 8–23)
CHLORIDE: 102 mmol/L (ref 98–111)
CO2: 21 mmol/L — ABNORMAL LOW (ref 22–32)
CREATININE: 1.42 mg/dL — AB (ref 0.44–1.00)
Calcium: 8.6 mg/dL — ABNORMAL LOW (ref 8.9–10.3)
GFR calc Af Amer: 45 mL/min — ABNORMAL LOW (ref >60)
GFR calc non Af Amer: 38 mL/min — ABNORMAL LOW (ref >60)
GLUCOSE: 123 mg/dL — AB (ref 70–99)
POTASSIUM: 3.5 mmol/L (ref 3.5–5.1)
Sodium: 135 mmol/L (ref 135–145)

## 2018-06-04 LAB — MAGNESIUM: MAGNESIUM: 1.7 mg/dL (ref 1.7–2.4)

## 2018-06-04 MED ORDER — MINERAL OIL RE ENEM
1.0000 | ENEMA | Freq: Once | RECTAL | Status: DC
  Filled 2018-06-04: qty 1

## 2018-06-04 MED ORDER — SORBITOL 70 % SOLN
960.0000 mL | TOPICAL_OIL | Freq: Once | ORAL | Status: AC
  Administered 2018-06-04: 960 mL via RECTAL
  Filled 2018-06-04 (×2): qty 473

## 2018-06-04 MED ORDER — POTASSIUM CHLORIDE CRYS ER 20 MEQ PO TBCR
40.0000 meq | EXTENDED_RELEASE_TABLET | Freq: Once | ORAL | Status: AC
  Administered 2018-06-04: 40 meq via ORAL
  Filled 2018-06-04: qty 2

## 2018-06-04 MED ORDER — PANTOPRAZOLE SODIUM 40 MG PO TBEC
40.0000 mg | DELAYED_RELEASE_TABLET | Freq: Every day | ORAL | Status: DC
  Administered 2018-06-04 – 2018-06-05 (×2): 40 mg via ORAL
  Filled 2018-06-04 (×3): qty 1

## 2018-06-04 NOTE — Progress Notes (Signed)
No BM for my shift, following SMOG enema.

## 2018-06-04 NOTE — Progress Notes (Signed)
[name]  [name] 62 y.o. 1956-09-09   Subjective: Minimal loose stool yesterday following tap water enemas. No stool today. Complaining of abd pain. Ambulated in hall.  Objective: Vital signs: Vitals:   06/04/18 0839 06/04/18 1630  BP:  (!) 112/51  Pulse:  90  Resp:  18  Temp:  98.4 F (36.9 C)  SpO2: 96% 99%    Physical Exam: Gen: lethargic, mild acute distress  HEENT: anicteric sclera CV: RRR Chest: CTA B Abd: diffuse tenderness with guarding, mild distention, +BS Ext: no edema  Lab Results: Recent Labs    06/03/18 0140 06/04/18 0445  NA 139 135  K 3.3* 3.5  CL 104 102  CO2 23 21*  GLUCOSE 165* 123*  BUN 22 27*  CREATININE 0.84 1.42*  CALCIUM 9.1 8.6*  MG  --  1.7   Recent Labs    06/03/18 0140  AST 20  ALT 21  ALKPHOS 91  BILITOT 1.1  PROT 6.9  ALBUMIN 3.9   Recent Labs    06/03/18 0140 06/04/18 0445  WBC 16.6* 31.6*  NEUTROABS  --  29.0*  HGB 14.1 12.5  HCT 41.9 36.6  MCV 91.9 90.8  PLT 380 334      Assessment/Plan: Fecal impaction with no significant results from tap water enema. SMOG enema ordered and if that is successful then will give Trilyte tomorrow since she reports severe nausea with Miralax in the past. Continue clear liquids and supportive care.   Jill C. 06/04/2018, 4:59 PM  Questions please call [phone] ID: [name], female   DOB: 1956-02-23, 62 y.o.   MRN: [id number]

## 2018-06-04 NOTE — Progress Notes (Signed)
Administered 736mL of 970mL SMOG enema. Patient stated she can not tolerate any more solution. Will continue to monitor.

## 2018-06-04 NOTE — Progress Notes (Signed)
Progress Note    [name]  UYQ:[id number] DOB: 10-27-1956  DOA: 06/03/2018 PCP: Kathyrn Lass, MD    Brief Narrative:     Medical records reviewed and are as summarized below:  [name] is an 62 y.o. female with a Past Medical History of uterine CA; RLS; PAT; hypothyroidism; fibromyalgia/chronic fatigue (not on chronic opiates); and leukocytoclastic vasculitis who presents with severe abdominal pain x 2 days with n/v.  Patient has a h/o constipation and takes Linzess prn.  Her last BM was Wednesday.  She was found to have a stercoral ulcer with surrounding colitis and moderate stool retention.  Antibiotics are probably not overly beneficial in this situation, but will defer to GI.  She needs relief from constipation; Dr. Michail Sermon will see the patient and recommends treatment with a warm water enema.   Assessment/Plan:   Principal Problem:   Fecal impaction (HCC) Active Problems:   Enterocolitis   Asthma   Chronic fatigue syndrome   Hypothyroidism   Pernicious anemia   Vasculitis of skin   Leukocytosis (leucocytosis)  Fecal impaction with severe abdominal pain due to stercoral colitis.  -History of  IBS, chronic constipation on Linzess not taken for 1 week , no history of bowel obstruction. Last bowel movement 3 days ago. CT scan of the abdomen and pelvis shows stercoral colitis. -stool was not amenable to manual disimpaction due to location. Had one warm enema with minimal amount of stool.   Encourage mobilization Blood cultures Follow CBC daily GI consult: Dr. Schooler:in need of aggressive enemas that after discussing she is willing to receive. If the enemas work, then will consider Miralax bowel prep. I do not think a flex sig is needed but will need an updated colonoscopy this year as an outpt. At discharge needs to do the Linzess 145 mcg/day unless diarrhea occurs and then would use it every other day or every 2 days instead of as needed.   Leukocytosis -?  Etiology -U/A pending -patient is on immunocompromising medications so will need to be monitored closely -hold on abx but will monitor for signs of perforation  AKI -IVF -bladder scan 0 -recheck in AM  Hypothyroidism: Continue home Synthroid  History of vasculitis of the skin, followed by dermatology.  The patient is on methotrexate ,leucovorin weekly, plaquenil Resume MTX and Leucovorin weekly on 8/6 and 8/7 respectively Resume plaquenil -monitor cautiously as patient has WBC count elevating  History of cystitis, on Elmiron Continue Elmiron Check UA   Anxiety and Depression Continue Xanax for anxiety and Cymbalta, Effexor  for depression   Restless leg syndrome Continue Lyrica   Hypertension  Continue home anti-hypertensive medications   Hyperlipidemia Continue home statins  Elevated Glucose  HgbA1c <6  Hypokalemia -replete and check Mg    Family Communication/Anticipated D/C date and plan/Code Status   DVT prophylaxis: Lovenox ordered. Code Status: DNR.  Family Communication: none Disposition Plan:    Medical Consultants:    GI     Subjective:   Only a small amount of stool after enema  Objective:    Vitals:   06/03/18 1336 06/03/18 2045 06/04/18 0533 06/04/18 0839  BP: 135/60 135/61 132/69   Pulse: 91 90 97   Resp: 14 17 17    Temp: 98.1 F (36.7 C) 98.5 F (36.9 C) 99.2 F (37.3 C)   TempSrc: Oral Oral Oral   SpO2: 96% 96% 96% 96%  Weight:      Height:  Intake/Output Summary (Last 24 hours) at 06/04/2018 1355 Last data filed at 06/04/2018 0938 Gross per 24 hour  Intake 2166.3 ml  Output 300 ml  Net 1866.3 ml   Filed Weights   06/03/18 0118 06/03/18 0830  Weight: 74.8 kg (165 lb) 75.3 kg (166 lb 0.1 oz)    Exam: Awake but ill appearing rrr Diminished bowel sounds, tenderness in LLQ, no rebound tenderness No LE edema guarded  Data Reviewed:   I have personally reviewed following labs and imaging  studies:  Labs: Labs show the following:   Basic Metabolic Panel: Recent Labs  Lab 06/03/18 0140 06/04/18 0445  NA 139 135  K 3.3* 3.5  CL 104 102  CO2 23 21*  GLUCOSE 165* 123*  BUN 22 27*  CREATININE 0.84 1.42*  CALCIUM 9.1 8.6*   GFR Estimated Creatinine Clearance: 41.2 mL/min (A) (by C-G formula based on SCr of 1.42 mg/dL (H)). Liver Function Tests: Recent Labs  Lab 06/03/18 0140  AST 20  ALT 21  ALKPHOS 91  BILITOT 1.1  PROT 6.9  ALBUMIN 3.9   Recent Labs  Lab 06/03/18 0140  LIPASE 42   No results for input(s): AMMONIA in the last 168 hours. Coagulation profile No results for input(s): INR, PROTIME in the last 168 hours.  CBC: Recent Labs  Lab 06/03/18 0140 06/04/18 0445  WBC 16.6* 31.6*  NEUTROABS  --  29.0*  HGB 14.1 12.5  HCT 41.9 36.6  MCV 91.9 90.8  PLT 380 334   Cardiac Enzymes: No results for input(s): CKTOTAL, CKMB, CKMBINDEX, TROPONINI in the last 168 hours. BNP (last 3 results) No results for input(s): PROBNP in the last 8760 hours. CBG: No results for input(s): GLUCAP in the last 168 hours. D-Dimer: No results for input(s): DDIMER in the last 72 hours. Hgb A1c: Recent Labs    06/03/18 0918  HGBA1C 5.3   Lipid Profile: No results for input(s): CHOL, HDL, LDLCALC, TRIG, CHOLHDL, LDLDIRECT in the last 72 hours. Thyroid function studies: No results for input(s): TSH, T4TOTAL, T3FREE, THYROIDAB in the last 72 hours.  Invalid input(s): FREET3 Anemia work up: No results for input(s): VITAMINB12, FOLATE, FERRITIN, TIBC, IRON, RETICCTPCT in the last 72 hours. Sepsis Labs: Recent Labs  Lab 06/03/18 0140 06/03/18 0918 06/04/18 0445  WBC 16.6*  --  31.6*  LATICACIDVEN  --  1.4  --     Microbiology Recent Results (from the past 240 hour(s))  Culture, blood (routine x 2)     Status: None (Preliminary result)   Collection Time: 06/03/18  9:20 AM  Result Value Ref Range Status   Specimen Description BLOOD LEFT ANTECUBITAL   Final   Special Requests   Final    BOTTLES DRAWN AEROBIC ONLY Blood Culture adequate volume   Culture   Final    NO GROWTH 1 DAY Performed at [facility], [address] [place], [place] [address]    Report Status PENDING  Incomplete  Culture, blood (routine x 2)     Status: None (Preliminary result)   Collection Time: 06/03/18  9:30 AM  Result Value Ref Range Status   Specimen Description BLOOD BLOOD LEFT HAND  Final   Special Requests   Final    BOTTLES DRAWN AEROBIC ONLY Blood Culture adequate volume   Culture   Final    NO GROWTH 1 DAY Performed at [facility], [place] [address] [place], [place] [address]    Report Status PENDING  Incomplete  Procedures and diagnostic studies:  Ct Abdomen Pelvis W Contrast  Result Date: 06/03/2018 CLINICAL DATA:  Lower abdominal pain x2 days with nausea and vomiting. EXAM: CT ABDOMEN AND PELVIS WITH CONTRAST TECHNIQUE: Multidetector CT imaging of the abdomen and pelvis was performed using the standard protocol following bolus administration of intravenous contrast. CONTRAST:  180mL OMNIPAQUE IOHEXOL 300 MG/ML  SOLN COMPARISON:  10/26/2016 FINDINGS: Lower chest: Normal heart size without pericardial effusion. Subsegmental atelectasis at the lung bases. Small hiatal hernia. Hepatobiliary: Cholecystectomy. No space-occupying mass of the liver. No biliary dilatation. Pancreas: Normal Spleen: Normal Adrenals/Urinary Tract: Normal bilateral adrenal glands. Symmetric cortical enhancement of both kidneys without nephrolithiasis nor obstructive uropathy. The urinary bladder is unremarkable for the degree of distention. Stomach/Bowel: The stomach is decompressed. The duodenal sweep and ligament of Treitz position is normal. Small bowel dilatation or inflammation. The distal and terminal ileum are unremarkable. The appendix is not well visualized. Increased colonic stool burden is noted within the cecum and ascending colon. Additionally there is a  large amount of stool with transmural thickening and involving the rectosigmoid suspicious for stercoral colitis. Associated small amount free fluid is seen in the pelvis. Vascular/Lymphatic: No significant vascular findings are present. No enlarged abdominal or pelvic lymph nodes. Reproductive: Status post hysterectomy. No adnexal masses. Other: Small amount of free fluid in the pelvis as described. Musculoskeletal: No acute nor suspicious osseous abnormalities. Lower lumbar facet arthropathy with mild disc flattening at L4-5 and anterolisthesis of L4 on L5. IMPRESSION: Moderate stool retention within the rectosigmoid with transmural thickening, adjacent small volume of free fluid and inflammation consistent with stercoral colitis. Electronically Signed   By: Ashley Royalty M.D.   On: 06/03/2018 03:54    Medications:   . aspirin EC  81 mg Oral Daily  . atorvastatin  10 mg Oral Daily  . DULoxetine  90 mg Oral Daily  . enoxaparin (LOVENOX) injection  40 mg Subcutaneous Daily  . hydroxychloroquine  200 mg Oral BID  . [START ON 06/05/2018] leucovorin  125 mg Oral Q7 days  . levothyroxine  137 mcg Oral QAC breakfast  . loratadine  10 mg Oral Daily  . Methotrexate (PF)  0.6 mL Subcutaneous Weekly  . metoprolol succinate  25 mg Oral Daily  . mineral oil  1 enema Rectal Once  . mometasone-formoterol  2 puff Inhalation BID  . pentosan polysulfate  100 mg Oral BID  . pregabalin  75 mg Oral BID  . venlafaxine  37.5 mg Oral BID  . vitamin B-12  1,000 mcg Oral Daily   Continuous Infusions: . sodium chloride 100 mL/hr at 06/04/18 1117  . famotidine (PEPCID) IV 20 mg (06/04/18 1126)  . ondansetron (ZOFRAN) IV 8 mg (06/04/18 0526)     LOS: 0 days   Geradine Girt  [facility]   *Please refer to [email or web address], password [email or web address] to get updated schedule on who will round on this patient, as hospitalists switch teams weekly. If 7PM-7AM, please contact night-coverage at [email or web address], password [email or web address] for any  overnight needs.  06/04/2018, 1:55 PM

## 2018-06-05 ENCOUNTER — Inpatient Hospital Stay (HOSPITAL_COMMUNITY): Payer: Medicare Other | Admitting: Anesthesiology

## 2018-06-05 ENCOUNTER — Inpatient Hospital Stay (HOSPITAL_COMMUNITY): Payer: Medicare Other

## 2018-06-05 ENCOUNTER — Encounter (HOSPITAL_COMMUNITY): Payer: Self-pay | Admitting: Anesthesiology

## 2018-06-05 ENCOUNTER — Encounter (HOSPITAL_COMMUNITY)
Admission: EM | Disposition: A | Discharge status: Home Health | Payer: Self-pay | Source: Home / Self Care | Attending: Internal Medicine

## 2018-06-05 DIAGNOSIS — R109 Unspecified abdominal pain: Secondary | ICD-10-CM

## 2018-06-05 HISTORY — PX: PARTIAL COLECTOMY: SHX5273

## 2018-06-05 HISTORY — PX: COLOSTOMY: SHX63

## 2018-06-05 LAB — URINALYSIS, ROUTINE W REFLEX MICROSCOPIC
Bilirubin Urine: NEGATIVE
GLUCOSE, UA: NEGATIVE mg/dL
Hgb urine dipstick: NEGATIVE
Ketones, ur: NEGATIVE mg/dL
Leukocytes, UA: NEGATIVE
NITRITE: NEGATIVE
Protein, ur: 100 mg/dL — AB
SPECIFIC GRAVITY, URINE: 1.033 — AB (ref 1.005–1.030)
pH: 5 (ref 5.0–8.0)

## 2018-06-05 LAB — BASIC METABOLIC PANEL
Anion gap: 12 (ref 5–15)
BUN: 31 mg/dL — AB (ref 8–23)
CHLORIDE: 105 mmol/L (ref 98–111)
CO2: 16 mmol/L — AB (ref 22–32)
CREATININE: 1.81 mg/dL — AB (ref 0.44–1.00)
Calcium: 8.5 mg/dL — ABNORMAL LOW (ref 8.9–10.3)
GFR calc Af Amer: 33 mL/min — ABNORMAL LOW (ref >60)
GFR calc non Af Amer: 29 mL/min — ABNORMAL LOW (ref >60)
Glucose, Bld: 108 mg/dL — ABNORMAL HIGH (ref 70–99)
Potassium: 3.3 mmol/L — ABNORMAL LOW (ref 3.5–5.1)
Sodium: 133 mmol/L — ABNORMAL LOW (ref 135–145)

## 2018-06-05 LAB — CBC
HEMATOCRIT: 34.2 % — AB (ref 36.0–46.0)
HEMOGLOBIN: 11.2 g/dL — AB (ref 12.0–15.0)
MCH: 30.2 pg (ref 26.0–34.0)
MCHC: 32.7 g/dL (ref 30.0–36.0)
MCV: 92.2 fL (ref 78.0–100.0)
Platelets: 323 10*3/uL (ref 150–400)
RBC: 3.71 MIL/uL — ABNORMAL LOW (ref 3.87–5.11)
RDW: 12.2 % (ref 11.5–15.5)
WBC: 32.6 10*3/uL — ABNORMAL HIGH (ref 4.0–10.5)

## 2018-06-05 LAB — SURGICAL PCR SCREEN
MRSA, PCR: NEGATIVE
STAPHYLOCOCCUS AUREUS: NEGATIVE

## 2018-06-05 SURGERY — COLECTOMY, PARTIAL
Anesthesia: General | Site: Abdomen

## 2018-06-05 MED ORDER — MAGNESIUM SULFATE 2 GM/50ML IV SOLN
2.0000 g | Freq: Once | INTRAVENOUS | Status: AC
  Administered 2018-06-05: 2 g via INTRAVENOUS
  Filled 2018-06-05: qty 50

## 2018-06-05 MED ORDER — OXYCODONE HCL 5 MG/5ML PO SOLN
5.0000 mg | Freq: Once | ORAL | Status: DC | PRN
Start: 2018-06-05 — End: 2018-06-05

## 2018-06-05 MED ORDER — PHENYLEPHRINE 40 MCG/ML (10ML) SYRINGE FOR IV PUSH (FOR BLOOD PRESSURE SUPPORT)
PREFILLED_SYRINGE | INTRAVENOUS | Status: AC
  Filled 2018-06-05: qty 10

## 2018-06-05 MED ORDER — LIDOCAINE HCL (CARDIAC) PF 100 MG/5ML IV SOSY
PREFILLED_SYRINGE | INTRAVENOUS | Status: DC | PRN
  Administered 2018-06-05: 60 mg via INTRAVENOUS

## 2018-06-05 MED ORDER — SUCCINYLCHOLINE CHLORIDE 20 MG/ML IJ SOLN
INTRAMUSCULAR | Status: DC | PRN
  Administered 2018-06-05: 70 mg via INTRAVENOUS

## 2018-06-05 MED ORDER — DIPHENHYDRAMINE HCL 50 MG/ML IJ SOLN
12.5000 mg | Freq: Four times a day (QID) | INTRAMUSCULAR | Status: DC | PRN
  Filled 2018-06-05: qty 0.25

## 2018-06-05 MED ORDER — FENTANYL CITRATE (PF) 250 MCG/5ML IJ SOLN
INTRAMUSCULAR | Status: AC
  Filled 2018-06-05: qty 5

## 2018-06-05 MED ORDER — LACTATED RINGERS IV SOLN
INTRAVENOUS | Status: DC
  Administered 2018-06-05 (×3): via INTRAVENOUS

## 2018-06-05 MED ORDER — HYDROMORPHONE 1 MG/ML IV SOLN
INTRAVENOUS | Status: DC
  Administered 2018-06-05: 25 mg via INTRAVENOUS
  Administered 2018-06-06: 0.6 mg via INTRAVENOUS
  Administered 2018-06-06: 0.9 mg via INTRAVENOUS
  Administered 2018-06-06: 0.3 mg via INTRAVENOUS
  Administered 2018-06-06: 0.9 mg via INTRAVENOUS
  Administered 2018-06-07: 0.6 mg via INTRAVENOUS
  Administered 2018-06-07: 9 mg via INTRAVENOUS
  Administered 2018-06-07: 0.6 mg via INTRAVENOUS
  Administered 2018-06-07: 0.9 mg via INTRAVENOUS
  Administered 2018-06-07: 0 mg via INTRAVENOUS
  Administered 2018-06-07: 0.6 mg via INTRAVENOUS
  Administered 2018-06-08 (×2): 1.5 mg via INTRAVENOUS
  Administered 2018-06-08: 1.2 mg via INTRAVENOUS
  Administered 2018-06-08: 2.4 mg via INTRAVENOUS
  Administered 2018-06-08: 1.5 mg via INTRAVENOUS
  Administered 2018-06-08: 0.6 mL via INTRAVENOUS
  Administered 2018-06-08: 0.6 mg via INTRAVENOUS
  Administered 2018-06-09: 3.3 mg via INTRAVENOUS
  Administered 2018-06-09: 0 mg via INTRAVENOUS
  Administered 2018-06-09: 2.1 mg via INTRAVENOUS
  Administered 2018-06-09: 0.6 mg via INTRAVENOUS
  Administered 2018-06-10: 0.9 mg via INTRAVENOUS
  Administered 2018-06-10: 2.1 mg via INTRAVENOUS
  Administered 2018-06-10: 1.5 mg via INTRAVENOUS
  Administered 2018-06-10: 0 mg via INTRAVENOUS
  Filled 2018-06-05 (×2): qty 25

## 2018-06-05 MED ORDER — FENTANYL CITRATE (PF) 100 MCG/2ML IJ SOLN
25.0000 ug | INTRAMUSCULAR | Status: DC | PRN
  Administered 2018-06-05 (×2): 50 ug via INTRAVENOUS

## 2018-06-05 MED ORDER — ROCURONIUM BROMIDE 100 MG/10ML IV SOLN
INTRAVENOUS | Status: DC | PRN
  Administered 2018-06-05: 40 mg via INTRAVENOUS
  Administered 2018-06-05: 10 mg via INTRAVENOUS

## 2018-06-05 MED ORDER — FENTANYL CITRATE (PF) 100 MCG/2ML IJ SOLN
INTRAMUSCULAR | Status: DC | PRN
  Administered 2018-06-05: 100 ug via INTRAVENOUS

## 2018-06-05 MED ORDER — NALOXONE HCL 0.4 MG/ML IJ SOLN
0.4000 mg | INTRAMUSCULAR | Status: DC | PRN
  Filled 2018-06-05: qty 1

## 2018-06-05 MED ORDER — SODIUM CHLORIDE 0.9 % IV SOLN
INTRAVENOUS | Status: DC | PRN
  Administered 2018-06-05: 2 g via INTRAVENOUS

## 2018-06-05 MED ORDER — LIDOCAINE 2% (20 MG/ML) 5 ML SYRINGE
INTRAMUSCULAR | Status: AC
  Filled 2018-06-05: qty 5

## 2018-06-05 MED ORDER — POTASSIUM CHLORIDE IN NACL 20-0.9 MEQ/L-% IV SOLN
INTRAVENOUS | Status: DC
  Administered 2018-06-05: 08:00:00 via INTRAVENOUS
  Filled 2018-06-05: qty 1000

## 2018-06-05 MED ORDER — MORPHINE SULFATE (PF) 2 MG/ML IV SOLN
1.0000 mg | INTRAVENOUS | Status: DC | PRN
  Administered 2018-06-07: 1 mg via INTRAVENOUS
  Filled 2018-06-05: qty 1

## 2018-06-05 MED ORDER — DEXTROSE-NACL 5-0.9 % IV SOLN
INTRAVENOUS | Status: AC
  Administered 2018-06-05 – 2018-06-07 (×4): via INTRAVENOUS

## 2018-06-05 MED ORDER — SODIUM CHLORIDE 0.9% FLUSH: 9.0000 mL | INTRAVENOUS | Status: DC | PRN

## 2018-06-05 MED ORDER — ROCURONIUM BROMIDE 10 MG/ML (PF) SYRINGE
PREFILLED_SYRINGE | INTRAVENOUS | Status: AC
  Filled 2018-06-05: qty 10

## 2018-06-05 MED ORDER — DEXAMETHASONE SODIUM PHOSPHATE 4 MG/ML IJ SOLN
INTRAMUSCULAR | Status: DC | PRN
  Administered 2018-06-05: 5 mg via INTRAVENOUS

## 2018-06-05 MED ORDER — SODIUM CHLORIDE 0.9 % IV SOLN
INTRAVENOUS | Status: DC | PRN
  Administered 2018-06-05: 25 ug/min via INTRAVENOUS

## 2018-06-05 MED ORDER — PROPOFOL 10 MG/ML IV BOLUS
INTRAVENOUS | Status: AC
  Filled 2018-06-05: qty 20

## 2018-06-05 MED ORDER — DIPHENHYDRAMINE HCL 12.5 MG/5ML PO ELIX
12.5000 mg | ORAL_SOLUTION | Freq: Four times a day (QID) | ORAL | Status: DC | PRN
  Filled 2018-06-05: qty 5

## 2018-06-05 MED ORDER — ONDANSETRON HCL 4 MG/2ML IJ SOLN
INTRAMUSCULAR | Status: AC
  Filled 2018-06-05: qty 2

## 2018-06-05 MED ORDER — CEFOTETAN DISODIUM-DEXTROSE 2-2.08 GM-%(50ML) IV SOLR
2.0000 g | INTRAVENOUS | Status: DC
  Filled 2018-06-05: qty 50

## 2018-06-05 MED ORDER — SUCCINYLCHOLINE CHLORIDE 200 MG/10ML IV SOSY
PREFILLED_SYRINGE | INTRAVENOUS | Status: AC
  Filled 2018-06-05: qty 20

## 2018-06-05 MED ORDER — 0.9 % SODIUM CHLORIDE (POUR BTL) OPTIME
TOPICAL | Status: DC | PRN
  Administered 2018-06-05 (×8): 1000 mL

## 2018-06-05 MED ORDER — PIPERACILLIN-TAZOBACTAM 3.375 G IVPB
3.3750 g | Freq: Three times a day (TID) | INTRAVENOUS | Status: DC
  Administered 2018-06-05: 3.375 g via INTRAVENOUS

## 2018-06-05 MED ORDER — SUGAMMADEX SODIUM 200 MG/2ML IV SOLN
INTRAVENOUS | Status: DC | PRN
  Administered 2018-06-05: 200 mg via INTRAVENOUS

## 2018-06-05 MED ORDER — ONDANSETRON HCL 4 MG/2ML IJ SOLN
INTRAMUSCULAR | Status: DC | PRN
  Administered 2018-06-05: 4 mg via INTRAVENOUS

## 2018-06-05 MED ORDER — SODIUM CHLORIDE 0.9 % IV SOLN
Freq: Once | INTRAVENOUS | Status: AC
Start: 2018-06-05 — End: 2018-06-05
  Administered 2018-06-05: 20:00:00 via INTRAVENOUS

## 2018-06-05 MED ORDER — ONDANSETRON HCL 4 MG/2ML IJ SOLN
4.0000 mg | Freq: Four times a day (QID) | INTRAMUSCULAR | Status: DC | PRN
  Filled 2018-06-05 (×3): qty 2

## 2018-06-05 MED ORDER — PHENYLEPHRINE HCL 10 MG/ML IJ SOLN
INTRAMUSCULAR | Status: DC | PRN
  Administered 2018-06-05: 120 ug via INTRAVENOUS
  Administered 2018-06-05: 80 ug via INTRAVENOUS

## 2018-06-05 MED ORDER — SODIUM CHLORIDE 0.9 % IV SOLN
2.0000 g | INTRAVENOUS | Status: DC
  Filled 2018-06-05: qty 2

## 2018-06-05 MED ORDER — PIPERACILLIN-TAZOBACTAM 3.375 G IVPB
3.3750 g | Freq: Three times a day (TID) | INTRAVENOUS | Status: DC
  Administered 2018-06-05 – 2018-06-14 (×26): 3.375 g via INTRAVENOUS
  Filled 2018-06-05 (×24): qty 50

## 2018-06-05 MED ORDER — MIDAZOLAM HCL 2 MG/2ML IJ SOLN
INTRAMUSCULAR | Status: AC
  Filled 2018-06-05: qty 2

## 2018-06-05 MED ORDER — MIDAZOLAM HCL 5 MG/5ML IJ SOLN
INTRAMUSCULAR | Status: DC | PRN
  Administered 2018-06-05: 1 mg via INTRAVENOUS

## 2018-06-05 MED ORDER — PROPOFOL 10 MG/ML IV BOLUS
INTRAVENOUS | Status: DC | PRN
  Administered 2018-06-05: 130 mg via INTRAVENOUS
  Administered 2018-06-05: 20 mg via INTRAVENOUS

## 2018-06-05 MED ORDER — STERILE WATER FOR IRRIGATION IR SOLN
Status: DC | PRN
  Administered 2018-06-05: 1000 mL

## 2018-06-05 MED ORDER — OXYCODONE HCL 5 MG PO TABS: 5.0000 mg | ORAL_TABLET | Freq: Once | ORAL | Status: DC | PRN

## 2018-06-05 MED ORDER — FENTANYL CITRATE (PF) 100 MCG/2ML IJ SOLN
INTRAMUSCULAR | Status: AC
  Administered 2018-06-05: 20:00:00
  Filled 2018-06-05: qty 2

## 2018-06-05 MED ORDER — DEXAMETHASONE SODIUM PHOSPHATE 10 MG/ML IJ SOLN
INTRAMUSCULAR | Status: AC
  Filled 2018-06-05: qty 1

## 2018-06-05 SURGICAL SUPPLY — 53 items
BLADE CLIPPER SURG (BLADE) ×2 IMPLANT
BNDG GAUZE ELAST 4 BULKY (GAUZE/BANDAGES/DRESSINGS) ×2 IMPLANT
CHLORAPREP W/TINT 26ML (MISCELLANEOUS) ×4 IMPLANT
DRAIN CHANNEL 19F RND (DRAIN) ×2 IMPLANT
DRAPE HALF SHEET 40X57 (DRAPES) ×6 IMPLANT
DRAPE LAPAROSCOPIC ABDOMINAL (DRAPES) ×4 IMPLANT
DRAPE UTILITY XL STRL (DRAPES) ×12 IMPLANT
DRAPE WARM FLUID 44X44 (DRAPE) ×4 IMPLANT
ELECT BLADE 6.5 EXT (BLADE) ×4 IMPLANT
ELECT CAUTERY BLADE 6.4 (BLADE) ×8 IMPLANT
ELECT REM PT RETURN 9FT ADLT (ELECTROSURGICAL) ×4
ELECTRODE REM PT RTRN 9FT ADLT (ELECTROSURGICAL) ×2 IMPLANT
EVACUATOR SILICONE 100CC (DRAIN) ×2 IMPLANT
GAUZE SPONGE 4X4 12PLY STRL (GAUZE/BANDAGES/DRESSINGS) ×2 IMPLANT
GLOVE BIOGEL PI IND STRL 7.5 (GLOVE) IMPLANT
GLOVE BIOGEL PI IND STRL 8 (GLOVE) ×4 IMPLANT
GLOVE BIOGEL PI INDICATOR 7.5 (GLOVE) ×2
GLOVE BIOGEL PI INDICATOR 8 (GLOVE) ×4
GLOVE SURG SS PI 6.5 STRL IVOR (GLOVE) ×4 IMPLANT
GLOVE SURG SS PI 7.0 STRL IVOR (GLOVE) ×8 IMPLANT
GLOVE SURG SS PI 7.5 STRL IVOR (GLOVE) ×4 IMPLANT
GLOVE SURG SS PI 8.0 STRL IVOR (GLOVE) ×4 IMPLANT
GOWN STRL REUS W/ TWL LRG LVL3 (GOWN DISPOSABLE) ×8 IMPLANT
GOWN STRL REUS W/ TWL XL LVL3 (GOWN DISPOSABLE) ×4 IMPLANT
GOWN STRL REUS W/TWL LRG LVL3 (GOWN DISPOSABLE) ×4
GOWN STRL REUS W/TWL XL LVL3 (GOWN DISPOSABLE) ×8
KIT BASIN OR (CUSTOM PROCEDURE TRAY) ×4 IMPLANT
KIT COLOSTOMY ILEOSTOMY 2.75 (WOUND CARE) ×2 IMPLANT
KIT TURNOVER KIT B (KITS) ×4 IMPLANT
LIGASURE IMPACT 36 18CM CVD LR (INSTRUMENTS) ×2 IMPLANT
NS IRRIG 1000ML POUR BTL (IV SOLUTION) ×20 IMPLANT
Neptune e-sep 165mm suction sleeve ×2 IMPLANT
PACK GENERAL/GYN (CUSTOM PROCEDURE TRAY) ×4 IMPLANT
PAD ABD 8X10 STRL (GAUZE/BANDAGES/DRESSINGS) ×2 IMPLANT
PAD ARMBOARD 7.5X6 YLW CONV (MISCELLANEOUS) ×4 IMPLANT
PENCIL SMOKE EVACUATOR (MISCELLANEOUS) ×2 IMPLANT
SPONGE LAP 18X18 X RAY DECT (DISPOSABLE) ×10 IMPLANT
STAPLER CUT CVD 40MM GREEN (STAPLE) ×2 IMPLANT
STAPLER PROXIMATE 75MM BLUE (STAPLE) ×2 IMPLANT
STAPLER VISISTAT 35W (STAPLE) ×4 IMPLANT
SUCTION POOLE TIP (SUCTIONS) ×4 IMPLANT
SUT ETHILON 2 0 FS 18 (SUTURE) ×4 IMPLANT
SUT PDS AB 1 TP1 54 (SUTURE) ×4 IMPLANT
SUT SILK 2 0 SH CR/8 (SUTURE) ×4 IMPLANT
SUT SILK 2 0 TIES 10X30 (SUTURE) ×4 IMPLANT
SUT SILK 3 0 SH CR/8 (SUTURE) ×4 IMPLANT
SUT SILK 3 0 TIES 10X30 (SUTURE) ×4 IMPLANT
SUT VIC AB 3-0 SH 18 (SUTURE) ×2 IMPLANT
TAPE CLOTH SURG 6X10 WHT LF (GAUZE/BANDAGES/DRESSINGS) ×2 IMPLANT
TRAY FOL W/BAG SLVR 16FR STRL (SET/KITS/TRAYS/PACK) IMPLANT
TRAY FOLEY W/BAG SLVR 16FR LF (SET/KITS/TRAYS/PACK) ×4
WATER STERILE IRR 1000ML POUR (IV SOLUTION) ×2 IMPLANT
YANKAUER SUCT BULB TIP NO VENT (SUCTIONS) ×4 IMPLANT

## 2018-06-05 NOTE — Progress Notes (Signed)
Received call from CT stating that CT abd/pelvis would have to be done without contrast due to pt's GFR level.  PA notified.  [name]

## 2018-06-05 NOTE — Progress Notes (Addendum)
Progress Note    [name]  ZJI:[id number] DOB: 19-Jun-1956  DOA: 06/03/2018 PCP: Kathyrn Lass, MD    Brief Narrative:     Medical records reviewed and are as summarized below:  [name] is an 62 y.o. female with a Past Medical History of uterine CA; RLS; PAT; hypothyroidism; fibromyalgia/chronic fatigue (not on chronic opiates); and leukocytoclastic vasculitis who presents with severe abdominal pain x 2 days with n/v.  Patient has a h/o constipation and takes Linzess prn.  Her last BM was Wednesday.  She was found to have a stercoral ulcer with surrounding colitis and moderate stool retention.    Assessment/Plan:   Principal Problem:   Fecal impaction (HCC) Active Problems:   Enterocolitis   Asthma   Chronic fatigue syndrome   Hypothyroidism   Pernicious anemia   Vasculitis of skin   Leukocytosis (leucocytosis)  Fecal impaction with severe abdominal pain due to stercoral colitis.  -History of  IBS, chronic constipation on Linzess not taken for 1 week , no history of bowel obstruction. Last bowel movement 3 days ago. CT scan of the abdomen and pelvis shows stercoral colitis. -stool was not amenable to manual disimpaction due to location. Had one warm enema with minimal amount of stool.   On 8/7 developed rebound tenderness, KUB ordered to r/o perf, General surgery consult placed FBP:ZWCHE dilatation with scattered air-fluid levels. A degree of bowel obstruction is felt to be present. Note that there are loops of both small and large bowel dilatation with air-fluid levels in these areas. Question functional bowel obstruction due to relative obstruction of sigmoid colon due to stool. The appearance currently is consistent with the findings suggesting stercoral colitis distally on recent CT.  Leukocytosis -add abx coverage for abdominal issues  AKI -IVF -trending up, renal U/S w/o obstruction  Hypothyroidism: Continue home Synthroid- may need to change to  IV  History of vasculitis of the skin, followed by dermatology.  The patient is on methotrexate ,leucovorin weekly, plaquenil -hold meds  History of cystitis, on Elmiron Continue Elmiron  Anxiety and Depression Continue Xanax for anxiety and Cymbalta, Effexor  for depression   Restless leg syndrome Continue Lyrica   Hypertension  NPO for now PRN IV   Hyperlipidemia Continue home statins  Elevated Glucose  HgbA1c <6  Hypokalemia -repleted in IVF  Changes in patient's status are concerning, depending on results of scans, may need transfer to a SDU  Family Communication/Anticipated D/C date and plan/Code Status   DVT prophylaxis: Lovenox ordered. Code Status: DNR.  Family Communication: none Disposition Plan:    Medical Consultants:    GI  General surgery     Subjective:   C/o worsening abdominal pain  Objective:    Vitals:   06/04/18 2134 06/05/18 0529 06/05/18 0648 06/05/18 0820  BP:  114/62    Pulse:  92    Resp:  18    Temp:  99.3 F (37.4 C)    TempSrc:  Oral    SpO2: 94% 96%  95%  Weight:   82.2 kg (181 lb 3.5 oz)   Height:        Intake/Output Summary (Last 24 hours) at 06/05/2018 1240 Last data filed at 06/05/2018 1034 Gross per 24 hour  Intake 4527.82 ml  Output 100 ml  Net 4427.82 ml   Filed Weights   06/03/18 0118 06/03/18 0830 06/05/18 0648  Weight: 74.8 kg (165 lb) 75.3 kg (166 lb 0.1 oz) 82.2 kg (181 lb 3.5  oz)    Exam: In  Bed, ill appearing rrr + rebound tenderness Min LE edema A+Ox3  Data Reviewed:   I have personally reviewed following labs and imaging studies:  Labs: Labs show the following:   Basic Metabolic Panel: Recent Labs  Lab 06/03/18 0140 06/04/18 0445 05-Jul-2018 0550  NA 139 135 133*  K 3.3* 3.5 3.3*  CL 104 102 105  CO2 23 21* 16*  GLUCOSE 165* 123* 108*  BUN 22 27* 31*  CREATININE 0.84 1.42* 1.81*  CALCIUM 9.1 8.6* 8.5*  MG  --  1.7  --    GFR Estimated Creatinine Clearance: 33.7  mL/min (A) (by C-G formula based on SCr of 1.81 mg/dL (H)). Liver Function Tests: Recent Labs  Lab 06/03/18 0140  AST 20  ALT 21  ALKPHOS 91  BILITOT 1.1  PROT 6.9  ALBUMIN 3.9   Recent Labs  Lab 06/03/18 0140  LIPASE 42   No results for input(s): AMMONIA in the last 168 hours. Coagulation profile No results for input(s): INR, PROTIME in the last 168 hours.  CBC: Recent Labs  Lab 06/03/18 0140 06/04/18 0445 07-05-18 0550  WBC 16.6* 31.6* 32.6*  NEUTROABS  --  29.0*  --   HGB 14.1 12.5 11.2*  HCT 41.9 36.6 34.2*  MCV 91.9 90.8 92.2  PLT 380 334 323   Cardiac Enzymes: No results for input(s): CKTOTAL, CKMB, CKMBINDEX, TROPONINI in the last 168 hours. BNP (last 3 results) No results for input(s): PROBNP in the last 8760 hours. CBG: No results for input(s): GLUCAP in the last 168 hours. D-Dimer: No results for input(s): DDIMER in the last 72 hours. Hgb A1c: Recent Labs    06/03/18 0918  HGBA1C 5.3   Lipid Profile: No results for input(s): CHOL, HDL, LDLCALC, TRIG, CHOLHDL, LDLDIRECT in the last 72 hours. Thyroid function studies: No results for input(s): TSH, T4TOTAL, T3FREE, THYROIDAB in the last 72 hours.  Invalid input(s): FREET3 Anemia work up: No results for input(s): VITAMINB12, FOLATE, FERRITIN, TIBC, IRON, RETICCTPCT in the last 72 hours. Sepsis Labs: Recent Labs  Lab 06/03/18 0140 06/03/18 0918 06/04/18 0445 07/05/18 0550  WBC 16.6*  --  31.6* 32.6*  LATICACIDVEN  --  1.4  --   --     Microbiology Recent Results (from the past 240 hour(s))  Culture, blood (routine x 2)     Status: None (Preliminary result)   Collection Time: 06/03/18  9:20 AM  Result Value Ref Range Status   Specimen Description BLOOD LEFT ANTECUBITAL  Final   Special Requests   Final    BOTTLES DRAWN AEROBIC ONLY Blood Culture adequate volume   Culture   Final    NO GROWTH 1 DAY Performed at [facility], [address], [place], [place] [address]    Report  Status PENDING  Incomplete  Culture, blood (routine x 2)     Status: None (Preliminary result)   Collection Time: 06/03/18  9:30 AM  Result Value Ref Range Status   Specimen Description BLOOD BLOOD LEFT HAND  Final   Special Requests   Final    BOTTLES DRAWN AEROBIC ONLY Blood Culture adequate volume   Culture   Final    NO GROWTH 1 DAY Performed at [facility], [place] [address] [place], [place] [address]    Report Status PENDING  Incomplete    Procedures and diagnostic studies:  US Renal  Result Date: 07-05-2018 CLINICAL DATA:  Acute kidney injury EXAM: RENAL /  URINARY TRACT ULTRASOUND COMPLETE COMPARISON:  None. FINDINGS: Right Kidney: Length: 11.3 cm. Echogenicity within normal limits. No mass or hydronephrosis visualized. Left Kidney: Length: 11 cm. Echogenicity within normal limits. No mass or hydronephrosis visualized. Bladder: Appears normal for degree of bladder distention. IMPRESSION: Normal renal ultrasound. Electronically Signed   By: Kathreen Devoid   On: 06/05/2018 10:50   Dg Abd Acute W/chest  Result Date: 06/05/2018 CLINICAL DATA:  Constipation; abdominal pain EXAM: DG ABDOMEN ACUTE W/ 1V CHEST COMPARISON:  Chest radiograph Mar 01, 2011; CT abdomen and pelvis June 03, 2018 FINDINGS: PA chest: There is atelectatic change in the right base. Lungs elsewhere are clear. Heart size and pulmonary vascularity are normal. No adenopathy. Supine and upright abdomen: There is distension of the distal sigmoid colon and upper rectum due to stool. There are loops of dilated bowel with scattered air-fluid levels. No free air evident. Surgical clips are noted in the right upper quadrant region. There are phleboliths in the pelvis. IMPRESSION: Bowel dilatation with scattered air-fluid levels. A degree of bowel obstruction is felt to be present. Note that there are loops of both small and large bowel dilatation with air-fluid levels in these areas. Question functional bowel obstruction due to  relative obstruction of sigmoid colon due to stool. The appearance currently is consistent with the findings suggesting stercoral colitis distally on recent CT. No free air evident. Right base atelectasis.  Lungs elsewhere clear. Electronically Signed   By: Lowella Grip III M.D.   On: 06/05/2018 09:06    Medications:   . aspirin EC  81 mg Oral Daily  . atorvastatin  10 mg Oral Daily  . DULoxetine  90 mg Oral Daily  . enoxaparin (LOVENOX) injection  40 mg Subcutaneous Daily  . levothyroxine  137 mcg Oral QAC breakfast  . loratadine  10 mg Oral Daily  . metoprolol succinate  25 mg Oral Daily  . mometasone-formoterol  2 puff Inhalation BID  . pantoprazole  40 mg Oral Daily  . pentosan polysulfate  100 mg Oral BID  . pregabalin  75 mg Oral BID  . venlafaxine  37.5 mg Oral BID  . vitamin B-12  1,000 mcg Oral Daily   Continuous Infusions: . 0.9 % NaCl with KCl 20 mEq / L 75 mL/hr at 06/05/18 0814  . famotidine (PEPCID) IV 20 mg (06/05/18 1032)  . ondansetron [facility]) IV 8 mg (06/05/18 0511)  . piperacillin-tazobactam (ZOSYN)  IV 3.375 g (06/05/18 1129)     LOS: 1 day   Geradine Girt  [facility]   *Please refer to [email or web address], password [email or web address] to get updated schedule on who will round on this patient, as hospitalists switch teams weekly. If 7PM-7AM, please contact night-coverage at [email or web address], password [email or web address] for any overnight needs.  06/05/2018, 12:40 PM

## 2018-06-05 NOTE — Anesthesia Preprocedure Evaluation (Signed)
Anesthesia Evaluation  Patient identified by MRN, date of birth, ID band Patient awake    Reviewed: Allergy & Precautions, NPO status , Patient's Chart, lab work & pertinent test results  History of Anesthesia Complications Negative for: history of anesthetic complications  Airway Mallampati: II  TM Distance: >3 FB Neck ROM: Full    Dental  (+) Teeth Intact   Pulmonary asthma ,    breath sounds clear to auscultation       Cardiovascular + Peripheral Vascular Disease   Rhythm:Regular     Neuro/Psych  Neuromuscular disease negative psych ROS   GI/Hepatic Neg liver ROS, Sigmoid volvulus    Endo/Other  Hypothyroidism   Renal/GU negative Renal ROS  negative genitourinary   Musculoskeletal  (+) Fibromyalgia -  Abdominal   Peds negative pediatric ROS (+)  Hematology  (+) anemia ,   Anesthesia Other Findings   Reproductive/Obstetrics                             Anesthesia Physical Anesthesia Plan  ASA: II  Anesthesia Plan: General   Post-op Pain Management:    Induction: Intravenous, Rapid sequence and Cricoid pressure planned  PONV Risk Score and Plan: 3 and Ondansetron and Dexamethasone  Airway Management Planned: Oral ETT  Additional Equipment: None  Intra-op Plan:   Post-operative Plan: Extubation in OR  Informed Consent: I have reviewed the patients History and Physical, chart, labs and discussed the procedure including the risks, benefits and alternatives for the proposed anesthesia with the patient or authorized representative who has indicated his/her understanding and acceptance.   Dental advisory given  Plan Discussed with: CRNA and Surgeon  Anesthesia Plan Comments:         Anesthesia Quick Evaluation

## 2018-06-05 NOTE — Anesthesia Procedure Notes (Signed)
Procedure Name: Intubation Date/Time: 06/05/2018 5:19 PM Performed by: Oletta Lamas, CRNA Pre-anesthesia Checklist: Patient identified, Emergency Drugs available, Suction available and Patient being monitored Patient Re-evaluated:Patient Re-evaluated prior to induction Oxygen Delivery Method: Circle System Utilized Preoxygenation: Pre-oxygenation with 100% oxygen Induction Type: IV induction and Rapid sequence Laryngoscope Size: Miller and 2 Grade View: Grade II Tube type: Oral Tube size: 7.0 mm Number of attempts: 1 Airway Equipment and Method: Stylet and Oral airway Placement Confirmation: ETT inserted through vocal cords under direct vision,  positive ETCO2 and breath sounds checked- equal and bilateral Secured at: 22 cm Tube secured with: Tape Dental Injury: Teeth and Oropharynx as per pre-operative assessment

## 2018-06-05 NOTE — Anesthesia Postprocedure Evaluation (Signed)
Anesthesia Post Note  Patient: [name]  Procedure(s) Performed: EXPLORATORY LAPAROTOMY AND PARTIAL COLECTOMY (N/A Abdomen) COLOSTOMY (Left Abdomen)     Patient location during evaluation: PACU Anesthesia Type: General Level of consciousness: awake and alert Pain management: pain level controlled Vital Signs Assessment: post-procedure vital signs reviewed and stable Respiratory status: spontaneous breathing, nonlabored ventilation, respiratory function stable and patient connected to nasal cannula oxygen Cardiovascular status: blood pressure returned to baseline and stable Postop Assessment: no apparent nausea or vomiting Anesthetic complications: no    Last Vitals:  Vitals:   06/05/18 2039 06/05/18 2120  BP:  103/61  Pulse: 85 86  Resp: 20 18  Temp:  (!) 36.4 C  SpO2: 97% 97%    Last Pain:  Vitals:   06/05/18 2120  TempSrc: Oral  PainSc:                  Tiajuana Amass

## 2018-06-05 NOTE — Progress Notes (Signed)
Pharmacy Antibiotic Note  [name] is a 62 y.o. female admitted on 06/03/2018 with abdominal pain, found to have fecal impaction with sterocoral colitis.   Pharmacy has been consulted for Zosyn dosing.  Renal function is worsening.  Afebrile and WBC increased to 32.6   Plan: Zosyn EID 3.375gm IV Q8H Monitor renal fxn, clinical progress   Height: 5\' 5"  (165.1 cm) Weight: 181 lb 3.5 oz (82.2 kg) IBW/kg (Calculated) : 57  Temp (24hrs), Avg:98.9 F (37.2 C), Min:98.4 F (36.9 C), Max:99.3 F (37.4 C)  Recent Labs  Lab 06/03/18 0140 06/03/18 0918 06/04/18 0445 06/05/18 0550  WBC 16.6*  --  31.6* 32.6*  CREATININE 0.84  --  1.42* 1.81*  LATICACIDVEN  --  1.4  --   --     Estimated Creatinine Clearance: 33.7 mL/min (A) (by C-G formula based on SCr of 1.81 mg/dL (H)).    Allergies  Allergen Reactions  . Ambien [Zolpidem Tartrate] Other (See Comments)    Hallucinations  . Bee Venom Itching, Shortness Of Breath and Swelling  . Minocycline     Rash  . Peanut-Containing Drug Products Anaphylaxis  . Tape Itching, Rash and Swelling  . Ciprofloxacin Nausea And Vomiting  . Latex Rash  . Shellfish Allergy Rash     Zosyn 8/7 >>  8/5 BCx - NGTD   Adriene Padula D. Mina Marble, PharmD, BCPS, [place] 06/05/2018, 8:39 AM

## 2018-06-05 NOTE — Transfer of Care (Signed)
Immediate Anesthesia Transfer of Care Note  Patient: [name]  Procedure(s) Performed: EXPLORATORY LAPAROTOMY AND PARTIAL COLECTOMY (N/A Abdomen) COLOSTOMY (Left Abdomen)  Patient Location: PACU  Anesthesia Type:General  Level of Consciousness: awake, alert , oriented and patient cooperative  Airway & Oxygen Therapy: Patient Spontanous Breathing and Patient connected to nasal cannula oxygen  Post-op Assessment: Report given to RN and Post -op Vital signs reviewed and stable  Post vital signs: Reviewed and stable  Last Vitals:  Vitals Value Taken Time  BP 107/58 06/05/2018  7:08 PM  Temp 36.6 C 06/05/2018  7:06 PM  Pulse 89 06/05/2018  7:12 PM  Resp 28 06/05/2018  7:12 PM  SpO2 92 % 06/05/2018  7:12 PM  Vitals shown include unvalidated device data.  Last Pain:  Vitals:   06/05/18 1528  TempSrc: Oral  PainSc:       Patients Stated Pain Goal: 0 (31/51/76 1607)  Complications: No apparent anesthesia complications

## 2018-06-05 NOTE — Progress Notes (Signed)
[facility] Progress Note  ESPARANZA KRIDER 62 y.o. Oct 30, 1956   Subjective: Abdominal pain; nausea; No BMs with SMOG enema or tap water enemas. NG placed this morning. Husband at bedside.  Objective: Vital signs: Vitals:   06/05/18 0529 06/05/18 0820  BP: 114/62   Pulse: 92   Resp: 18   Temp: 99.3 F (37.4 C)   SpO2: 96% 95%    Physical Exam: Gen: lethargic, elderly, mild acute distress  HEENT: anicteric sclera CV: RRR Chest: CTA B Abd: diffuse tenderness with guarding, +distention, +BS Ext: no edema  Lab Results: Recent Labs    06/04/18 0445 06/05/18 0550  NA 135 133*  K 3.5 3.3*  CL 102 105  CO2 21* 16*  GLUCOSE 123* 108*  BUN 27* 31*  CREATININE 1.42* 1.81*  CALCIUM 8.6* 8.5*  MG 1.7  --    Recent Labs    06/03/18 0140  AST 20  ALT 21  ALKPHOS 91  BILITOT 1.1  PROT 6.9  ALBUMIN 3.9   Recent Labs    06/04/18 0445 06/05/18 0550  WBC 31.6* 32.6*  NEUTROABS 29.0*  --   HGB 12.5 11.2*  HCT 36.6 34.2*  MCV 90.8 92.2  PLT 334 323      Assessment/Plan: Obstipation/Fecal Impaction - no BMs with enemas; Air fluid levels on Xray and NG placed. Leucocytosis. Lack of benefit from enemas and elevated WBC concerning with abdominal exam and may need surgical intervention. No free air on Xray today. My recommendation would be stat CT and if no perforation then to do manual disimpaction in OR under anethesia. D/W Dr. Rosendo Gros.   [place] 06/05/2018, 12:20 PM  Questions please call [phone] ID: [name], female   DOB: 04/26/1956, 62 y.o.   MRN: [id number]

## 2018-06-05 NOTE — Progress Notes (Signed)
Pt returned from CT After viewing the CT and discussing with Dr. Jacqualyn Posey, it appears that pt had a sigmoid volvulus. I d/w the pt  And her husband findings on CT. The plan is to proceed to the OR emergently for Ex lap possible ostomy. I d/w the pt the risks and benefits of the procedure to include but not limited to: Infection, bleeding, damage to surrounding structure, possible need for further surgery.  The patient voiced understanding and wishes to proceed.

## 2018-06-05 NOTE — Consult Note (Addendum)
[facility] Surgery Consult Note  [name] 05/27/1956  [id number].    Requesting MD: Eliseo Squires, DO Chief Complaint/Reason for Consult: stercoral colitis  HPI:  [name] is a 62 y/o F with a PMH uterine CA, hypothyroidism, fibromyalgia, constipation, and leukocytoclastic vasculitis who presented to [facility] with a cc of abdominal pain. Patient reports that 6 days ago, after dinner, she had an "incomplete BM". The days following this she tried to have another bowel movement by taking gas-x, Linzess, and over the counter medications. She reports abdominal pain starting about 4 days ago and becoming progressively worse, associated with chills, nausea, and vomiting. Reports that her last real BM was Wednesday (7 days ago).   Work-up in ED significant for a stercoral colitis of rectosigmoid colon and increasing colonic stool burden. Patient was admitted and GI consulted. General surgery has been asked to see for worsening abdominal pain and concerns for peritonitis.  ROS: Review of Systems  Constitutional: Positive for chills. Negative for fever.  Gastrointestinal: Positive for abdominal pain, nausea and vomiting.  All other systems reviewed and are negative.   History reviewed. No pertinent family history.  Past Medical History:  Diagnosis Date  . Back pain   . Chronic fatigue syndrome    Dr. Sabra Heck  . Cough    /SOB, PFT's nl, improved with Bronchodilation 8/11  . Esophageal spasm    GERD Dr. Sabra Heck, Dr. Wynetta Emery; egd 2006 nl; UGI 3/13 Small HH, moderate GERD, nl motility; seen at [facility] ([place]), egd? neg, sone improvement on sucralfate susp  . Fibromyalgia   . Foot pain    Dr. Rushie Nyhan  . High triglycerides   . Hypothyroidism    Dr. Lewis Shock 4/14  . Leukocytoclastic vasculitis ([place])    Dr. Tonia Brooms  . Low HDL (under 40)   . Mitral valve prolapse   . Paroxysmal atrial tachycardia (HCC)    Dr. Marlou Porch  . Pernicious anemia    Dr. Sabra Heck  . Reflux    ? delayed gastric emptying--GES  nl 01/2012 (6% at 2hrs)  . RLS (restless legs syndrome)    low ferritin Dr. Sabra Heck  . Uterine carcinoma (HCC)    Dr. Polly Cobia    Past Surgical History:  Procedure Laterality Date  . ABDOMINAL HYSTERECTOMY    . BUNIONECTOMY    . CHOLECYSTECTOMY    . COLONOSCOPY     scr, 12/2008 ([facility]) nl  . HAMMER TOE SURGERY    . LUNG BIOPSY     L Lower lung pulm nodules, largest 4.102m, low risk, repeat CT in 1 yr now [facility] Stable tiny lung nodules, no f/u needed  . METATARSAL OSTEOTOMY    . NECK SURGERY    . [place]OCARDIOGRAPHY     with Nuclear test, Dr. SMarlou Porch Low risk  . VESICOVAGINAL FISTULA CLOSURE W/ TAH      Social History:  reports that she has never smoked. She has never used smokeless tobacco. She reports that she does not drink alcohol or use drugs.  Allergies:  Allergies  Allergen Reactions  . Ambien [Zolpidem Tartrate] Other (See Comments)    Hallucinations  . Bee Venom Itching, Shortness Of Breath and Swelling  . Minocycline     Rash  . Peanut-Containing Drug Products Anaphylaxis  . Tape Itching, Rash and Swelling  . Ciprofloxacin Nausea And Vomiting  . Latex Rash  . Shellfish Allergy Rash    Medications Prior to Admission  Medication Sig Dispense Refill  . albuterol (PROVENTIL  HFA;VENTOLIN HFA) 108 (90 Base) MCG/ACT inhaler Inhale 2 puffs into the lungs every 4 (four) hours as needed for wheezing or shortness of breath.    . ALPRAZolam (XANAX) 1 MG tablet Take 0.5-1 mg by mouth at bedtime as needed for anxiety or sleep.     Marland Kitchen aspirin EC 81 MG tablet Take 1 tablet (81 mg total) by mouth daily. 90 tablet 3  . atorvastatin (LIPITOR) 10 MG tablet Take 1 tablet (10 mg total) by mouth daily. 90 tablet 3  . Bioflavonoid Products (PERIDIN-C PO) Take 2 tablets by mouth 3 (three) times daily.     . Cholecalciferol (VITAMIN D3) 1000 units CAPS Take 2,000 Units by mouth daily.     . clindamycin (CLINDAGEL) 1 % gel Apply 1 application topically daily.    Marland Kitchen  dexlansoprazole (DEXILANT) 60 MG capsule Take 60 mg by mouth daily.    . diclofenac sodium (VOLTAREN) 1 % GEL Apply 2 g topically 3 (three) times daily as needed (pain).     Marland Kitchen doxycycline (PERIOSTAT) 20 MG tablet Take 20 mg by mouth 2 (two) times daily.    . DULoxetine (CYMBALTA) 30 MG capsule Take 90 mg by mouth daily.     Marland Kitchen EPINEPHrine 0.3 mg/0.3 mL IJ SOAJ injection Inject 0.3 mg into the muscle daily as needed (allergic reaction).     . fexofenadine (ALLEGRA) 180 MG tablet Take 180 mg by mouth daily.    . fluticasone-salmeterol (ADVAIR HFA) 115-21 MCG/ACT inhaler Inhale 2 puffs into the lungs 2 (two) times daily as needed ((ASTHMA)).     . hydroxychloroquine (PLAQUENIL) 200 MG tablet Take 200 mg by mouth 2 (two) times daily.    Marland Kitchen leucovorin (WELLCOVORIN) 5 MG tablet Take 25 tablets by mouth every 7 (seven) days. wednesday    . levothyroxine (SYNTHROID, LEVOTHROID) 150 MCG tablet Take 137 mcg by mouth daily before breakfast.     . linaclotide (LINZESS) 145 MCG CAPS capsule Take 145 mcg by mouth daily as needed (constipation).     . meloxicam (MOBIC) 15 MG tablet Take 15 mg by mouth daily.    . Methotrexate, PF, 25 MG/0.5ML SOAJ Inject 0.6 mLs into the skin once a week. tuesday    . metoprolol succinate (TOPROL-XL) 25 MG 24 hr tablet Take 1 tablet (25 mg total) by mouth daily. 90 tablet 3  . METRONIDAZOLE EX Apply 1 application topically 2 (two) times daily.    . nitroGLYCERIN (NITROSTAT) 0.4 MG SL tablet Place 1 tablet (0.4 mg total) under the tongue every 5 (five) minutes as needed for chest pain. 25 tablet 3  . ondansetron (ZOFRAN) 8 MG tablet Take 8 mg by mouth every 8 (eight) hours as needed for nausea or vomiting.    . pentosan polysulfate (ELMIRON) 100 MG capsule Take 100 mg by mouth 2 (two) times daily.     . pregabalin (LYRICA) 75 MG capsule Take 75 mg by mouth 2 (two) times daily.    Marland Kitchen venlafaxine (EFFEXOR) 37.5 MG tablet Take 37.5 mg by mouth 2 (two) times daily.    . vitamin B-12  (CYANOCOBALAMIN) 1000 MCG tablet Take 1,000 mcg by mouth daily.      Blood pressure 114/62, pulse 92, temperature 99.3 F (37.4 C), temperature source Oral, resp. rate 18, height 5' 5"  (1.651 m), weight 82.2 kg (181 lb 3.5 oz), SpO2 95 %. Physical Exam: Physical Exam  Constitutional: She is oriented to person, place, and time. She appears well-developed and well-nourished.  Non-toxic appearance.  HENT:  Head: Normocephalic and atraumatic.  Eyes: Pupils are equal, round, and reactive to light. EOM are normal. Scleral icterus is present.  Cardiovascular: Normal rate, regular rhythm, normal heart sounds and intact distal pulses. Exam reveals no gallop.  No murmur heard. Pulmonary/Chest: Effort normal and breath sounds normal. No stridor. No respiratory distress. She has no wheezes. She has no rhonchi. She has no rales.  Abdominal: Normal appearance. She exhibits distension. Bowel sounds are decreased. There is generalized tenderness and tenderness in the left lower quadrant. There is rebound and guarding. No hernia.  Neurological: She is alert and oriented to person, place, and time.  Skin: Skin is warm and dry. No rash noted.  Psychiatric: She has a normal mood and affect. Her behavior is normal.    Results for orders placed or performed during the hospital encounter of 06/03/18 (from the past 48 hour(s))  Basic metabolic panel     Status: Abnormal   Collection Time: 06/04/18  4:45 AM  Result Value Ref Range   Sodium 135 135 - 145 mmol/L   Potassium 3.5 3.5 - 5.1 mmol/L   Chloride 102 98 - 111 mmol/L   CO2 21 (L) 22 - 32 mmol/L   Glucose, Bld 123 (H) 70 - 99 mg/dL   BUN 27 (H) 8 - 23 mg/dL   Creatinine, Ser 1.42 (H) 0.44 - 1.00 mg/dL   Calcium 8.6 (L) 8.9 - 10.3 mg/dL   GFR calc non Af Amer 38 (L) >60 mL/min   GFR calc Af Amer 45 (L) >60 mL/min    Comment: (NOTE) The eGFR has been calculated using the CKD EPI equation. This calculation has not been validated in all clinical  situations. eGFR's persistently <60 mL/min signify possible Chronic Kidney Disease.    Anion gap 12 5 - 15    Comment: Performed at [facility] [address], [place], [place] [address]  CBC     Status: Abnormal   Collection Time: 06/04/18  4:45 AM  Result Value Ref Range   WBC 31.6 (H) 4.0 - 10.5 K/uL   RBC 4.03 3.87 - 5.11 MIL/uL   Hemoglobin 12.5 12.0 - 15.0 g/dL   HCT 36.6 36.0 - 46.0 %   MCV 90.8 78.0 - 100.0 fL   MCH 31.0 26.0 - 34.0 pg   MCHC 34.2 30.0 - 36.0 g/dL   RDW 11.7 11.5 - 15.5 %   Platelets 334 150 - 400 K/uL    Comment: Performed at [facility], [place] [address] [place], [place] [address]  Differential     Status: Abnormal   Collection Time: 06/04/18  4:45 AM  Result Value Ref Range   Neutrophils Relative % 92 %   Neutro Abs 29.0 (H) 1.7 - 7.7 K/uL   Lymphocytes Relative 2 %   Lymphs Abs 0.6 (L) 0.7 - 4.0 K/uL   Monocytes Relative 5 %   Monocytes Absolute 1.6 (H) 0.1 - 1.0 K/uL   Eosinophils Relative 0 %   Eosinophils Absolute 0.0 0.0 - 0.7 K/uL   Basophils Relative 0 %   Basophils Absolute 0.1 0.0 - 0.1 K/uL   Immature Granulocytes 1 %   Abs Immature Granulocytes 0.2 (H) 0.0 - 0.1 K/uL    Comment: Performed at [facility] [address], [place], [place] [address]  Magnesium     Status: None   Collection Time: 06/04/18  4:45 AM  Result Value Ref Range   Magnesium 1.7 1.7 -  2.4 mg/dL    Comment: Performed at [facility], [place] [address], [place], [place] [address]  Urinalysis, Routine w reflex microscopic     Status: Abnormal   Collection Time: 06/04/18  7:39 PM  Result Value Ref Range   Color, Urine AMBER (A) YELLOW    Comment: BIOCHEMICALS MAY BE AFFECTED BY COLOR   APPearance HAZY (A) CLEAR   Specific Gravity, Urine 1.033 (H) 1.005 - 1.030   pH 5.0 5.0 - 8.0   Glucose, UA NEGATIVE NEGATIVE mg/dL   Hgb urine dipstick NEGATIVE NEGATIVE   Bilirubin Urine NEGATIVE NEGATIVE   Ketones, ur NEGATIVE NEGATIVE mg/dL    Protein, ur 100 (A) NEGATIVE mg/dL   Nitrite NEGATIVE NEGATIVE   Leukocytes, UA NEGATIVE NEGATIVE   RBC / HPF 6-10 0 - 5 RBC/hpf   WBC, UA 6-10 0 - 5 WBC/hpf   Bacteria, UA RARE (A) NONE SEEN   Squamous Epithelial / LPF 0-5 0 - 5   Mucus PRESENT    Hyaline Casts, UA PRESENT    Granular Casts, UA PRESENT     Comment: Performed at [facility], [place] [address], [place], [place] [address]  CBC     Status: Abnormal   Collection Time: 06/05/18  5:50 AM  Result Value Ref Range   WBC 32.6 (H) 4.0 - 10.5 K/uL   RBC 3.71 (L) 3.87 - 5.11 MIL/uL   Hemoglobin 11.2 (L) 12.0 - 15.0 g/dL   HCT 34.2 (L) 36.0 - 46.0 %   MCV 92.2 78.0 - 100.0 fL   MCH 30.2 26.0 - 34.0 pg   MCHC 32.7 30.0 - 36.0 g/dL   RDW 12.2 11.5 - 15.5 %   Platelets 323 150 - 400 K/uL    Comment: Performed at [facility], [place] [address] [place], [place] [address]  Basic metabolic panel     Status: Abnormal   Collection Time: 06/05/18  5:50 AM  Result Value Ref Range   Sodium 133 (L) 135 - 145 mmol/L   Potassium 3.3 (L) 3.5 - 5.1 mmol/L   Chloride 105 98 - 111 mmol/L   CO2 16 (L) 22 - 32 mmol/L   Glucose, Bld 108 (H) 70 - 99 mg/dL   BUN 31 (H) 8 - 23 mg/dL   Creatinine, Ser 1.81 (H) 0.44 - 1.00 mg/dL   Calcium 8.5 (L) 8.9 - 10.3 mg/dL   GFR calc non Af Amer 29 (L) >60 mL/min   GFR calc Af Amer 33 (L) >60 mL/min    Comment: (NOTE) The eGFR has been calculated using the CKD EPI equation. This calculation has not been validated in all clinical situations. eGFR's persistently <60 mL/min signify possible Chronic Kidney Disease.    Anion gap 12 5 - 15    Comment: Performed at [facility] [address], [place], [place] [address]   US Renal  Result Date: 06/05/2018 CLINICAL DATA:  Acute kidney injury EXAM: RENAL / URINARY TRACT ULTRASOUND COMPLETE COMPARISON:  None. FINDINGS: Right Kidney: Length: 11.3 cm. Echogenicity within normal limits. No mass or hydronephrosis visualized. Left Kidney: Length: 11  cm. Echogenicity within normal limits. No mass or hydronephrosis visualized. Bladder: Appears normal for degree of bladder distention. IMPRESSION: Normal renal ultrasound. Electronically Signed   By: Kathreen Devoid   On: 06/05/2018 10:50   Dg Abd Acute W/chest  Result Date: 06/05/2018 CLINICAL DATA:  Constipation; abdominal pain EXAM: DG ABDOMEN ACUTE W/ 1V CHEST COMPARISON:  Chest  radiograph Mar 01, 2011; CT abdomen and pelvis June 03, 2018 FINDINGS: PA chest: There is atelectatic change in the right base. Lungs elsewhere are clear. Heart size and pulmonary vascularity are normal. No adenopathy. Supine and upright abdomen: There is distension of the distal sigmoid colon and upper rectum due to stool. There are loops of dilated bowel with scattered air-fluid levels. No free air evident. Surgical clips are noted in the right upper quadrant region. There are phleboliths in the pelvis. IMPRESSION: Bowel dilatation with scattered air-fluid levels. A degree of bowel obstruction is felt to be present. Note that there are loops of both small and large bowel dilatation with air-fluid levels in these areas. Question functional bowel obstruction due to relative obstruction of sigmoid colon due to stool. The appearance currently is consistent with the findings suggesting stercoral colitis distally on recent CT. No free air evident. Right base atelectasis.  Lungs elsewhere clear. Electronically Signed   By: Lowella Grip III M.D.   On: 06/05/2018 09:06   Assessment/Plan Fecal impaction Stercoral colitis  Obstipation  -  Afebrile, VSS -  WBC 32.6, peritonitis on exam  -  AXR negative for free air  -  No BM/flatus after multiple enemas.  -  STAT CT Abd/pelvis to r/o interim perforation. If not perforated then will likely proceed to OR for fecal disimpaction under general anesthesia.   Jill Alexanders, [facility] 06/05/2018, 12:59 PM Pager: [phone] Consults: [phone] Mon-Fri 7:00  am-4:30 pm Sat-Sun 7:00 am-11:30 am

## 2018-06-05 NOTE — Op Note (Signed)
06/05/2018  6:45 PM  PATIENT:  [name]  62 y.o. female  PRE-OPERATIVE DIAGNOSIS: Sigmoid volvulus  POST-OPERATIVE DIAGNOSIS: Sigmoid volvulus with necrotic bowel  PROCEDURE:  Procedure(s): EXPLORATORY LAPAROTOMY , sigmoid COLECTOMY, COLOSTOMY (N/A)  SURGEON:  Surgeon(s) and Role:    * Ralene Ok, MD - Primary    * Cornett, Marcello Moores, MD - Assisting  ANESTHESIA:   general  EBL: 25 cc  BLOOD ADMINISTERED:none  DRAINS: (18 [place]) Jackson-Pratt drain(s) with closed bulb suction in the pelvis  LOCAL MEDICATIONS USED:  NONE  SPECIMEN:  Source of Specimen: Sigmoid colon, volvulized  DISPOSITION OF SPECIMEN:  PATHOLOGY  COUNTS:  YES  TOURNIQUET:  * No tourniquets in log *  DICTATION: .Dragon Dictation  Indication procedure: Patient is a 62 year old female who was having abdominal pain and peritoneal signs.  Patient underwent CT scan which did reveal sigmoid volvulus.  Secondary to this patient was taken back emergently for exploratory laparotomy.  Details of procedure: Patient was taken to the operating placed supine position with bilateral SCDs in place.  Patient underwent general endotracheal intubation.  Patient was then prepped and draped in standard fashion.  A timeout was called all facts verified.    A #10 blade was used to make a midline lower midline incision, cautery was used to maintain hemostasis and dissection was taken down to the midline fascia.  This was elevated between 2 Kocher's.  The peritoneal cavity was entered bluntly using a hemostat.  The fascia was then extended to the length of the skin incision.  This did extend just cephalad to the umbilicus.  At this time the small bowel was eviscerated.  The sigmoid colon could be seen necrotic within the pelvis.  At this time the small bowel was manually mobilized.  Secondary to necrosis there was a large spillage of stool within the abdominal cavity.  At this time the volvulized portion of the sigmoid colon  was brought up to the skin level.  There was an apparent adhesion to the left lower quadrant/pelvic wall that the area was volvulized at.  This was lysed.  At this time a mesenteric window was made at the rectum at the point of normal rectum.  A green load contour stapler was then used to transect the proximal portion of the viable rectum.  It should be noted that no Prolene stitch was placed at the staple line.  At this time the mesentery was ligated using a LigaSure device.  We proceeded proximally.  A portion of the left colon was visualized to be healthy.  This was transected using a 75 GIA stapler.  At this time secondary to large amount of stool spillage the abdominal cavity was irrigated out with multiple liters of saline.  An 89 [place] Blake drain was then placed via a stab incision to the right lower quadrant into the pelvis.  This was secured to the skin using a 2-0 nylon x1.  At this time the skin of the ostomy site was chosen.  Cautery device was used to retract the skin.  Cautery was used to create a circular incision.  The subcutaneous tissue was then incised.  The anterior fascia was incised in a cruciate fashion.  The muscles were then retracted laterally medially.  The posterior fascia was then incised.  2 fingerbreadths were allowed to be brought up through the ostomy site.  The left colon was then brought up through the ostomy site.  At this time the omentum was brought over the  midline area.  The fascia was then reapproximated using a single-stranded, #1 PDS in a standard running fashion.  Subcutaneous tissue was then irrigated out with sterile saline.  This was packed with saline soaked Kerlix.  At this time the ostomy was matured using 3-0 Vicryl in interrupted fashion.  Patient tired procedure well was taken to the recovery room in stable condition.  Assistant was crucial to help with exposure and mobilization of the sigmoid colon.  PLAN OF CARE: Admit to inpatient   PATIENT  DISPOSITION:  PACU - hemodynamically stable.   Delay start of Pharmacological VTE agent (>24hrs) due to surgical blood loss or risk of bleeding: No

## 2018-06-06 ENCOUNTER — Encounter (HOSPITAL_COMMUNITY): Payer: Self-pay | Admitting: General Surgery

## 2018-06-06 ENCOUNTER — Inpatient Hospital Stay: Payer: Self-pay

## 2018-06-06 LAB — CBC
HCT: 37.8 % (ref 36.0–46.0)
Hemoglobin: 12.6 g/dL (ref 12.0–15.0)
MCH: 30.7 pg (ref 26.0–34.0)
MCHC: 33.3 g/dL (ref 30.0–36.0)
MCV: 92 fL (ref 78.0–100.0)
PLATELETS: 450 10*3/uL — AB (ref 150–400)
RBC: 4.11 MIL/uL (ref 3.87–5.11)
RDW: 12.3 % (ref 11.5–15.5)
WBC: 18.1 10*3/uL — AB (ref 4.0–10.5)

## 2018-06-06 LAB — BASIC METABOLIC PANEL
Anion gap: 12 (ref 5–15)
BUN: 33 mg/dL — ABNORMAL HIGH (ref 8–23)
CO2: 16 mmol/L — ABNORMAL LOW (ref 22–32)
Calcium: 8.1 mg/dL — ABNORMAL LOW (ref 8.9–10.3)
Chloride: 107 mmol/L (ref 98–111)
Creatinine, Ser: 1.42 mg/dL — ABNORMAL HIGH (ref 0.44–1.00)
GFR calc Af Amer: 45 mL/min — ABNORMAL LOW (ref >60)
GFR, EST NON AFRICAN AMERICAN: 38 mL/min — AB (ref >60)
GLUCOSE: 168 mg/dL — AB (ref 70–99)
Potassium: 3.7 mmol/L (ref 3.5–5.1)
Sodium: 135 mmol/L (ref 135–145)

## 2018-06-06 MED ORDER — LORAZEPAM 2 MG/ML IJ SOLN: 0.5000 mg | Freq: Four times a day (QID) | INTRAMUSCULAR | Status: DC | PRN

## 2018-06-06 MED ORDER — METHOCARBAMOL 1000 MG/10ML IJ SOLN
500.0000 mg | Freq: Three times a day (TID) | INTRAVENOUS | Status: DC
  Administered 2018-06-06 – 2018-06-14 (×23): 500 mg via INTRAVENOUS
  Filled 2018-06-06 (×2): qty 5
  Filled 2018-06-06: qty 500
  Filled 2018-06-06: qty 5
  Filled 2018-06-06: qty 500
  Filled 2018-06-06 (×4): qty 5
  Filled 2018-06-06: qty 500
  Filled 2018-06-06 (×22): qty 5

## 2018-06-06 MED ORDER — LEVOTHYROXINE SODIUM 100 MCG IV SOLR: 68.5000 ug | Freq: Every day | INTRAVENOUS | Status: DC

## 2018-06-06 MED ORDER — LEVOTHYROXINE SODIUM 100 MCG IV SOLR
68.5000 ug | Freq: Every day | INTRAVENOUS | Status: DC
  Administered 2018-06-07 – 2018-06-13 (×7): 68.5 ug via INTRAVENOUS
  Filled 2018-06-06 (×7): qty 5

## 2018-06-06 MED ORDER — ACETAMINOPHEN 10 MG/ML IV SOLN
1000.0000 mg | Freq: Four times a day (QID) | INTRAVENOUS | Status: AC
  Administered 2018-06-06 – 2018-06-07 (×4): 1000 mg via INTRAVENOUS
  Filled 2018-06-06 (×4): qty 100

## 2018-06-06 MED ORDER — SODIUM CHLORIDE 0.9% FLUSH
10.0000 mL | INTRAVENOUS | Status: DC | PRN
  Administered 2018-06-11 – 2018-06-14 (×2): 10 mL
  Filled 2018-06-06 (×2): qty 40

## 2018-06-06 MED ORDER — SODIUM CHLORIDE 0.9% FLUSH
10.0000 mL | Freq: Two times a day (BID) | INTRAVENOUS | Status: DC
  Administered 2018-06-06: 10 mL
  Administered 2018-06-08: 30 mL
  Administered 2018-06-09 – 2018-06-14 (×2): 10 mL

## 2018-06-06 NOTE — Progress Notes (Signed)
Peripherally Inserted Central Catheter/Midline Placement  The IV Nurse has discussed with the patient and/or persons authorized to consent for the patient, the purpose of this procedure and the potential benefits and risks involved with this procedure.  The benefits include less needle sticks, lab draws from the catheter, and the patient may be discharged home with the catheter. Risks include, but not limited to, infection, bleeding, blood clot (thrombus formation), and puncture of an artery; nerve damage and irregular heartbeat and possibility to perform a PICC exchange if needed/ordered by physician.  Alternatives to this procedure were also discussed.  Bard Power PICC patient education guide, fact sheet on infection prevention and patient information card has been provided to patient /or left at bedside.    PICC/Midline Placement Documentation  PICC Double Lumen 06/06/18 PICC Right Brachial 34 cm 0 cm (Active)  Indication for Insertion or Continuance of Line Administration of hyperosmolar/irritating solutions (i.e. TPN, Vancomycin, etc.) 06/06/2018  3:01 PM  Exposed Catheter (cm) 0 cm 06/06/2018  3:01 PM  Site Assessment Clean;Dry;Intact 06/06/2018  3:01 PM  Lumen #1 Status Flushed;Saline locked;Blood return noted 06/06/2018  3:01 PM  Lumen #2 Status Flushed;Saline locked;Blood return noted 06/06/2018  3:01 PM  Dressing Type Transparent 06/06/2018  3:01 PM  Dressing Status Clean;Dry;Intact;Antimicrobial disc in place 06/06/2018  3:01 PM  Dressing Change Due 06/13/18 06/06/2018  3:01 PM       Gordan Payment 06/06/2018, 3:03 PM

## 2018-06-06 NOTE — Progress Notes (Signed)
[facility] Progress Note  [name] 62 y.o. 12/13/1955   Subjective: S/P sigmoid colectomy and colostomy for sigmoid volvulus. Postop pain on PCA pump.  Objective: Vital signs: Vitals:   06/06/18 0449 06/06/18 0835  BP:    Pulse:    Resp: 17 17  Temp:    SpO2: 95% 95%  T 97.5, P 86, BP 103/61  Physical Exam: Gen: lethargic, +acute distress, well-nourished HEENT: anicteric sclera CV: RRR Chest: CTA B Abd: abdominal surgical bandage with ostomy bag - not removed Ext: no edema  Lab Results: Recent Labs    06/04/18 0445 06/05/18 0550 06/06/18 0444  NA 135 133* 135  K 3.5 3.3* 3.7  CL 102 105 107  CO2 21* 16* 16*  GLUCOSE 123* 108* 168*  BUN 27* 31* 33*  CREATININE 1.42* 1.81* 1.42*  CALCIUM 8.6* 8.5* 8.1*  MG 1.7  --   --    No results for input(s): AST, ALT, ALKPHOS, BILITOT, PROT, ALBUMIN in the last 72 hours. Recent Labs    06/04/18 0445 06/05/18 0550 06/06/18 0444  WBC 31.6* 32.6* 18.1*  NEUTROABS 29.0*  --   --   HGB 12.5 11.2* 12.6  HCT 36.6 34.2* 37.8  MCV 90.8 92.2 92.0  PLT 334 323 450*      Assessment/Plan: Sigmoid volvulus - s/p sigmoid colectomy and colostomy. Postop management per surgery. Will continue to follow along as well. Appreciate Dr. Johney Frame and his colleagues care of this patient.   Jill C. 06/06/2018, 9:48 AM  Questions please call [phone] ID: [name], female   DOB: Oct 25, 1956, 62 y.o.   MRN: [id number]

## 2018-06-06 NOTE — Progress Notes (Signed)
Progress Note    [name]  EGB:[id number] DOB: 10-16-1956  DOA: 06/03/2018 PCP: Kathyrn Lass, MD    Brief Narrative:     Medical records reviewed and are as summarized below:  [name] is an 62 y.o. female with a Past Medical History of uterine CA; RLS; PAT; hypothyroidism; fibromyalgia/chronic fatigue (not on chronic opiates); and leukocytoclastic vasculitis who presents with severe abdominal pain x 2 days with n/v.  Patient has a h/o constipation and takes Linzess prn.  Her last BM was Wednesday.  She was found to have a stercoral ulcer with surrounding colitis and moderate stool retention.  Did not respond to enemas and on the AM of 8/7 patient's conditioned worsened with peritonitis.  KUB showed air fluid levels, CT scan showed volvulus and patient was taken for an ex lap and eventual sigmoid colectomy/colostomy.  Recovery is expected to be long with anticipated ileus and possible abdominal abscess.     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 Rosendo Gros -IV abx -course/hospitalization most likely will be complicated with possible ileus/abdominal abscess  -PCA per general surgery  Leukocytosis -IV abx  AKI -IVF -no obstruction -improving  Hypothyroidism: Change to 1/2 home dose via IV  History of vasculitis of the skin, followed by dermatology.  The patient is on methotrexate ,leucovorin weekly, plaquenil -hold meds  History of cystitis, on [place] D/c PO meds  Anxiety and Depression Po meds on hold  Restless leg syndrome Hold PO meds  Hypertension  NPO IV if  needed  Hyperlipidemia Hold PO meds  Elevated Glucose  HgbA1c <6  Hypokalemia -repleted in IVF -monitor    Family Communication/Anticipated D/C date and plan/Code Status   DVT prophylaxis: Lovenox ordered. Code Status: DNR.  Family Communication: husband Disposition Plan:    Medical Consultants:    GI  General surgery     Subjective:   sleepy  Objective:    Vitals:   06/06/18 0042 06/06/18 0449 06/06/18 0835 06/06/18 1031  BP:    133/68  Pulse:    98  Resp: 17 17 17  (!) 21  Temp:    98.3 F (36.8 C)  TempSrc:    Oral  SpO2: 96% 95% 95% 93%  Weight:      Height:        Intake/Output Summary (Last 24 hours) at 06/06/2018 1254 Last data filed at 06/06/2018 0700 Gross per 24 hour  Intake 2863.06 ml  Output 600 ml  Net 2263.06 ml   Filed Weights   06/03/18 0118 06/03/18 0830 06/05/18 0648  Weight: 74.8 kg 75.3 kg 82.2 kg    Exam: In chair, NG tube No wheezing Min LE edema rrr Soft/tender abd Foley in place Pleasant/cooperative   Data Reviewed:   I have personally reviewed following labs and imaging studies:  Labs: Labs show the following:   Basic Metabolic Panel: Recent Labs  Lab 06/03/18 0140 06/04/18 0445 06/05/18 0550 06/06/18 0444  NA 139 135 133* 135  K 3.3* 3.5 3.3* 3.7  CL 104 102 105 107  CO2 23 21* 16* 16*  GLUCOSE 165* 123* 108* 168*  BUN 22  27* 31* 33*  CREATININE 0.84 1.42* 1.81* 1.42*  CALCIUM 9.1 8.6* 8.5* 8.1*  MG  --  1.7  --   --    GFR Estimated Creatinine Clearance: 43 mL/min (A) (by C-G formula based on SCr of 1.42 mg/dL (H)). Liver Function Tests: Recent Labs  Lab 06/03/18 0140  AST 20  ALT 21  ALKPHOS 91  BILITOT 1.1  PROT 6.9  ALBUMIN 3.9   Recent Labs  Lab 06/03/18 0140  LIPASE 42   No results for input(s): AMMONIA in the last 168 hours. Coagulation profile No results for input(s): INR, PROTIME in the last 168 hours.  CBC: Recent Labs  Lab 06/03/18 0140 06/04/18 0445  06/05/18 0550 06/06/18 0444  WBC 16.6* 31.6* 32.6* 18.1*  NEUTROABS  --  29.0*  --   --   HGB 14.1 12.5 11.2* 12.6  HCT 41.9 36.6 34.2* 37.8  MCV 91.9 90.8 92.2 92.0  PLT 380 334 323 450*   Cardiac Enzymes: No results for input(s): CKTOTAL, CKMB, CKMBINDEX, TROPONINI in the last 168 hours. BNP (last 3 results) No results for input(s): PROBNP in the last 8760 hours. CBG: No results for input(s): GLUCAP in the last 168 hours. D-Dimer: No results for input(s): DDIMER in the last 72 hours. Hgb A1c: No results for input(s): HGBA1C in the last 72 hours. Lipid Profile: No results for input(s): CHOL, HDL, LDLCALC, TRIG, CHOLHDL, LDLDIRECT in the last 72 hours. Thyroid function studies: No results for input(s): TSH, T4TOTAL, T3FREE, THYROIDAB in the last 72 hours.  Invalid input(s): FREET3 Anemia work up: No results for input(s): VITAMINB12, FOLATE, FERRITIN, TIBC, IRON, RETICCTPCT in the last 72 hours. Sepsis Labs: Recent Labs  Lab 06/03/18 0140 06/03/18 0918 06/04/18 0445 06/05/18 0550 06/06/18 0444  WBC 16.6*  --  31.6* 32.6* 18.1*  LATICACIDVEN  --  1.4  --   --   --     Microbiology Recent Results (from the past 240 hour(s))  Culture, blood (routine x 2)     Status: None (Preliminary result)   Collection Time: 06/03/18  9:20 AM  Result Value Ref Range Status   Specimen Description BLOOD LEFT ANTECUBITAL  Final   Special Requests   Final    BOTTLES DRAWN AEROBIC ONLY Blood Culture adequate volume   Culture   Final    NO GROWTH 2 DAYS Performed at [facility], [address], [place], [place] [address]    Report Status PENDING  Incomplete  Culture, blood (routine x 2)     Status: None (Preliminary result)   Collection Time: 06/03/18  9:30 AM  Result Value Ref Range Status   Specimen Description BLOOD BLOOD LEFT HAND  Final   Special Requests   Final    BOTTLES DRAWN AEROBIC ONLY Blood Culture adequate volume   Culture   Final    NO GROWTH 2 DAYS Performed  at [facility], [place] [address], [place], [place] [address]    Report Status PENDING  Incomplete  Surgical pcr screen     Status: None   Collection Time: 06/05/18  4:27 PM  Result Value Ref Range Status   MRSA, PCR NEGATIVE NEGATIVE Final   Staphylococcus aureus NEGATIVE NEGATIVE Final    Comment: (NOTE) The Xpert SA Assay (FDA approved for NASAL specimens in patients 38 years of age and older), is one component of a comprehensive surveillance program. It is not intended to diagnose infection nor to guide or monitor treatment. Performed at  [facility], [place] [address] [place], [place] [address]     Procedures and diagnostic studies:  Ct Abdomen Pelvis Wo Contrast  Result Date: 06/05/2018 CLINICAL DATA:  62 year old female with a history of abdominal pain with nausea vomiting. Constipation. EXAM: CT ABDOMEN AND PELVIS WITHOUT CONTRAST TECHNIQUE: Multidetector CT imaging of the abdomen and pelvis was performed following the standard protocol without IV contrast. COMPARISON:  06/03/2018 FINDINGS: Lower chest: Trace atelectasis and pleural fluid at the bilateral lung bases. Hepatobiliary: Cranial caudal span of the right liver measures 16.4 cm. Surgical changes of cholecystectomy. Pancreas: Unremarkable pancreas. Spleen: Unremarkable spleen Adrenals/Urinary Tract: Bilateral adrenal glands unremarkable. There has been delayed excretion from the right kidney collecting system with mild hydronephrosis. Contrast present through the length of the proximal and mid segment of the right ureter. Left kidney demonstrates near complete evacuation/excretion of contrast from the collecting system and the left ureter. Urinary bladder demonstrates retention of some contrast. Stomach/Bowel: Gastric tube terminates within the stomach. Stomach is decompressed. Distended loops of small bowel with associated air-fluid levels. Since the prior CT there has been significant fluid accumulation throughout the  length of the colon, with distention of cecum, right colon, transverse colon and descending colon. There is similar configuration of formed stool within the sigmoid colon, with a loop of sigmoid colon and associated twisted mesentery of the low abdomen/pelvis. The distal rectum is decompressed. Crescent of air along the anterior aspect of the sigmoid colon on image 70 favored to be within the lumen of bowel. Vascular/Lymphatic: Mild atherosclerosis of the abdominal aorta. No adenopathy. Small volume of free fluid within the left pelvis, present on the comparison CT, likely reactive. Reproductive: Hysterectomy Other: Small fat containing umbilical hernia. Musculoskeletal: No acute displaced fracture. Degenerative changes of the spine. IMPRESSION: CT demonstrates sigmoid volvulus with signs of worsening bowel obstruction. Crescent of air at the anterior aspect of stool-filled sigmoid colon I would favored to be within bowel lumen, as there are no other signs of bowel perforation on the CT. These results were discussed at the time of interpretation on 06/05/2018 at 3:41 pm with Dr. Ralene Ok. Delayed excretion of contrast from the right collecting system, potentially secondary to distal inflammation or compression of the right ureter secondary to pelvic changes. Electronically Signed   By: Corrie Mckusick D.O.   On: 06/05/2018 15:43   US Renal  Result Date: 06/05/2018 CLINICAL DATA:  Acute kidney injury EXAM: RENAL / URINARY TRACT ULTRASOUND COMPLETE COMPARISON:  None. FINDINGS: Right Kidney: Length: 11.3 cm. Echogenicity within normal limits. No mass or hydronephrosis visualized. Left Kidney: Length: 11 cm. Echogenicity within normal limits. No mass or hydronephrosis visualized. Bladder: Appears normal for degree of bladder distention. IMPRESSION: Normal renal ultrasound. Electronically Signed   By: Kathreen Devoid   On: 06/05/2018 10:50   Dg Abd Acute W/chest  Result Date: 06/05/2018 CLINICAL DATA:   Constipation; abdominal pain EXAM: DG ABDOMEN ACUTE W/ 1V CHEST COMPARISON:  Chest radiograph Mar 01, 2011; CT abdomen and pelvis June 03, 2018 FINDINGS: PA chest: There is atelectatic change in the right base. Lungs elsewhere are clear. Heart size and pulmonary vascularity are normal. No adenopathy. Supine and upright abdomen: There is distension of the distal sigmoid colon and upper rectum due to stool. There are loops of dilated bowel with scattered air-fluid levels. No free air evident. Surgical clips are noted in the right upper quadrant region. There are phleboliths in the pelvis. IMPRESSION: Bowel dilatation with scattered air-fluid levels. A  degree of bowel obstruction is felt to be present. Note that there are loops of both small and large bowel dilatation with air-fluid levels in these areas. Question functional bowel obstruction due to relative obstruction of sigmoid colon due to stool. The appearance currently is consistent with the findings suggesting stercoral colitis distally on recent CT. No free air evident. Right base atelectasis.  Lungs elsewhere clear. Electronically Signed   By: Lowella Grip III M.D.   On: 06/05/2018 09:06   Dg Abd Portable 1v  Result Date: 06/05/2018 CLINICAL DATA:  Nasogastric tube position EXAM: PORTABLE ABDOMEN - 1 VIEW COMPARISON:  Portable exam 1206 hours compared to 06/05/2018 at 0827 hours FINDINGS: Nasogastric tube coiled in proximal stomach with tip near fundus. Air-filled loops of large and small bowel again identified in the upper abdomen. Surgical clips RIGHT upper quadrant. Bones demineralized. IMPRESSION: Nasogastric tube coiled in proximal stomach. Electronically Signed   By: Lavonia Dana M.D.   On: 06/05/2018 13:02   [place] Ekg Site Rite  Result Date: 06/06/2018 If Site Rite image not attached, placement could not be confirmed due to current cardiac rhythm.   Medications:   . cefoTEtan (CEFOTAN) IV  2 g Intravenous 60 min Pre-Op  . enoxaparin  (LOVENOX) injection  40 mg Subcutaneous Daily  . HYDROmorphone   Intravenous Q4H  . [START ON 06/07/2018] levothyroxine  68.5 mcg Intravenous Daily  . metoprolol succinate  25 mg Oral Daily  . mometasone-formoterol  2 puff Inhalation BID   Continuous Infusions: . acetaminophen    . dextrose 5 % and 0.9% NaCl 125 mL/hr at 06/06/18 0834  . famotidine (PEPCID) IV 20 mg (06/06/18 1051)  . methocarbamol (ROBAXIN) IV    . ondansetron (ZOFRAN) IV 8 mg (06/06/18 0530)  . piperacillin-tazobactam (ZOSYN)  IV 3.375 g (06/06/18 0530)     LOS: 2 days   Geradine Girt  [facility]   *Please refer to [email or web address], password [email or web address] to get updated schedule on who will round on this patient, as hospitalists switch teams weekly. If 7PM-7AM, please contact night-coverage at [email or web address], password [email or web address] for any overnight needs.  06/06/2018, 12:54 PM

## 2018-06-06 NOTE — Progress Notes (Signed)
PHARMACY - ADULT TOTAL PARENTERAL NUTRITION CONSULT NOTE   Pharmacy Consult for TPN Indication: s/p Sigmoid Colectomy/colostomy - possible ileus  Patient Measurements: Height: _0  (165.1 cm) Weight: 181 lb 3.5 oz (82.2 kg) IBW/kg (Calculated) : 57 TPN AdjBW (KG): 61.6 Body mass index is 30.16 kg/m. Usual Weight: 82.2kg  Assessment:  62yo female admitted with severe abdominal pain with N&V.  She was found to have sigmoid volvulus with bowel necrosis and perforation  She had a exp. lap on 8/7 and we have been asked to initiate TPN therapy for her.  A PICC line has been ordered but not yet placed.    GI: s/p sigmoid colectomy/colostomy on 8/7  Endo: No hx. of diabetes, A1c = 5.3.  She had an elevated serum glucose but no CBG's being done. Insulin requirements in the past 24 hours:  Lytes:  Electrolytes are within range with a borderline low Magnesium on 8/6.  Her illness presented over 2-3 days and normal eating prior to this. Renal:  She had a normal creatinine upon admit (8/5 = 0.84) which has now trended upward to 1.41 today with an estimated crcl of 61m/min.  Not sure that all UOP is being documented but currently at 0.137mkg/hr. Pulm:  She is currently on room air - was using 2L [place] yesterday Cards: VSS Hepatobil: AST/ALT wnl as is alk. Phos. Neuro:Pain issues - on Diluadid PCA ID: Zosyn 3.375gm q8 - need to watch renal fxn.  TPN Access: TPN start date: Nutritional Goals (per RD recommendation on ): kCal: Protein:  Fluid:  Goal TPN rate is __ ml/hr (provides )  Current Nutrition:   Plan:  -Will start TPN tomorrow since 12N deadline has passed and PICC not yet in place.. - I have ordered TPN labs for AM to evaluate Mag/Phos for potential refeeding syndrome. - Will f/u kcal recommendations per RD and begin TPN to meet these goals.  NiRober MinionPharmD., MS Clinical Pharmacist Pager:  [phone] you for allowing pharmacy to be part of this patients care  team. 06/06/2018,1:19 PM

## 2018-06-06 NOTE — Progress Notes (Signed)
[facility] Progress Note  1 Day Post-Op  Subjective: CC:  Husband at bedside. Endorses pain, PCA helps some but mostly makes her sleepy. Belching a little. Has not looked at her abdomen yet. Discussed the surgery with her this morning and answered her questions. Discussed possibility of post-operative ileus and intra-abdominal abscess.  Objective: Vital signs in last 24 hours: Temp:  [97.5 F (36.4 C)-99.3 F (37.4 C)] 97.5 F (36.4 C) (08/07 2120) Pulse Rate:  [85-99] 86 (08/07 2120) Resp:  [17-27] 17 (08/08 0835) BP: (88-123)/(53-75) 103/61 (08/07 2120) SpO2:  [91 %-97 %] 95 % (08/08 0835) Last BM Date: 06/04/18  Intake/Output from previous day: 08/07 0701 - 08/08 0700 In: 2863.1 [I.V.:2650; IV Piggyback:213.1] Out: 600 [Urine:300; Drains:150; Blood:150] Intake/Output this shift: No intake/output data recorded.  PE: Gen:  Alert, NAD, pleasant and cooperative  HEENT: NG tube with small amt bilious contents in cannister  Card:  Regular rate and rhythm Pulm:  Normal effort Abd: Soft, appropriately tender, midline dressing clean and dry, colostomy left central abdomen with viable stoma, no gas or stool in pouch. Hypoactive BS. R abdominal JP drain with 150 cc overnight. Sanguinous. GU: foley in place, urine a little dark  Skin: warm and dry, no rashes  Psych: A&Ox3   Lab Results:  Recent Labs    06/05/18 0550 06/06/18 0444  WBC 32.6* 18.1*  HGB 11.2* 12.6  HCT 34.2* 37.8  PLT 323 450*   BMET Recent Labs    06/05/18 0550 06/06/18 0444  NA 133* 135  K 3.3* 3.7  CL 105 107  CO2 16* 16*  GLUCOSE 108* 168*  BUN 31* 33*  CREATININE 1.81* 1.42*  CALCIUM 8.5* 8.1*   PT/INR No results for input(s): LABPROT, INR in the last 72 hours. CMP     Component Value Date/Time   NA 135 06/06/2018 0444   K 3.7 06/06/2018 0444   CL 107 06/06/2018 0444   CO2 16 (L) 06/06/2018 0444   GLUCOSE 168 (H) 06/06/2018 0444   BUN 33 (H) 06/06/2018 0444   CREATININE  1.42 (H) 06/06/2018 0444   CALCIUM 8.1 (L) 06/06/2018 0444   PROT 6.9 06/03/2018 0140   ALBUMIN 3.9 06/03/2018 0140   AST 20 06/03/2018 0140   ALT 21 06/03/2018 0140   ALKPHOS 91 06/03/2018 0140   BILITOT 1.1 06/03/2018 0140   GFRNONAA 38 (L) 06/06/2018 0444   GFRAA 45 (L) 06/06/2018 0444   Lipase     Component Value Date/Time   LIPASE 42 06/03/2018 0140       Studies/Results: Ct Abdomen Pelvis Wo Contrast  Result Date: 06/05/2018 CLINICAL DATA:  62 year old female with a history of abdominal pain with nausea vomiting. Constipation. EXAM: CT ABDOMEN AND PELVIS WITHOUT CONTRAST TECHNIQUE: Multidetector CT imaging of the abdomen and pelvis was performed following the standard protocol without IV contrast. COMPARISON:  06/03/2018 FINDINGS: Lower chest: Trace atelectasis and pleural fluid at the bilateral lung bases. Hepatobiliary: Cranial caudal span of the right liver measures 16.4 cm. Surgical changes of cholecystectomy. Pancreas: Unremarkable pancreas. Spleen: Unremarkable spleen Adrenals/Urinary Tract: Bilateral adrenal glands unremarkable. There has been delayed excretion from the right kidney collecting system with mild hydronephrosis. Contrast present through the length of the proximal and mid segment of the right ureter. Left kidney demonstrates near complete evacuation/excretion of contrast from the collecting system and the left ureter. Urinary bladder demonstrates retention of some contrast. Stomach/Bowel: Gastric tube terminates within the stomach. Stomach is decompressed. Distended loops of small bowel  with associated air-fluid levels. Since the prior CT there has been significant fluid accumulation throughout the length of the colon, with distention of cecum, right colon, transverse colon and descending colon. There is similar configuration of formed stool within the sigmoid colon, with a loop of sigmoid colon and associated twisted mesentery of the low abdomen/pelvis. The distal  rectum is decompressed. Crescent of air along the anterior aspect of the sigmoid colon on image 70 favored to be within the lumen of bowel. Vascular/Lymphatic: Mild atherosclerosis of the abdominal aorta. No adenopathy. Small volume of free fluid within the left pelvis, present on the comparison CT, likely reactive. Reproductive: Hysterectomy Other: Small fat containing umbilical hernia. Musculoskeletal: No acute displaced fracture. Degenerative changes of the spine. IMPRESSION: CT demonstrates sigmoid volvulus with signs of worsening bowel obstruction. Crescent of air at the anterior aspect of stool-filled sigmoid colon I would favored to be within bowel lumen, as there are no other signs of bowel perforation on the CT. These results were discussed at the time of interpretation on 06/05/2018 at 3:41 pm with Dr. Ralene Ok. Delayed excretion of contrast from the right collecting system, potentially secondary to distal inflammation or compression of the right ureter secondary to pelvic changes. Electronically Signed   By: Corrie Mckusick D.O.   On: 06/05/2018 15:43   US Renal  Result Date: 06/05/2018 CLINICAL DATA:  Acute kidney injury EXAM: RENAL / URINARY TRACT ULTRASOUND COMPLETE COMPARISON:  None. FINDINGS: Right Kidney: Length: 11.3 cm. Echogenicity within normal limits. No mass or hydronephrosis visualized. Left Kidney: Length: 11 cm. Echogenicity within normal limits. No mass or hydronephrosis visualized. Bladder: Appears normal for degree of bladder distention. IMPRESSION: Normal renal ultrasound. Electronically Signed   By: Kathreen Devoid   On: 06/05/2018 10:50   Dg Abd Acute W/chest  Result Date: 06/05/2018 CLINICAL DATA:  Constipation; abdominal pain EXAM: DG ABDOMEN ACUTE W/ 1V CHEST COMPARISON:  Chest radiograph Mar 01, 2011; CT abdomen and pelvis June 03, 2018 FINDINGS: PA chest: There is atelectatic change in the right base. Lungs elsewhere are clear. Heart size and pulmonary vascularity are  normal. No adenopathy. Supine and upright abdomen: There is distension of the distal sigmoid colon and upper rectum due to stool. There are loops of dilated bowel with scattered air-fluid levels. No free air evident. Surgical clips are noted in the right upper quadrant region. There are phleboliths in the pelvis. IMPRESSION: Bowel dilatation with scattered air-fluid levels. A degree of bowel obstruction is felt to be present. Note that there are loops of both small and large bowel dilatation with air-fluid levels in these areas. Question functional bowel obstruction due to relative obstruction of sigmoid colon due to stool. The appearance currently is consistent with the findings suggesting stercoral colitis distally on recent CT. No free air evident. Right base atelectasis.  Lungs elsewhere clear. Electronically Signed   By: Lowella Grip III M.D.   On: 06/05/2018 09:06   Dg Abd Portable 1v  Result Date: 06/05/2018 CLINICAL DATA:  Nasogastric tube position EXAM: PORTABLE ABDOMEN - 1 VIEW COMPARISON:  Portable exam 1206 hours compared to 06/05/2018 at 0827 hours FINDINGS: Nasogastric tube coiled in proximal stomach with tip near fundus. Air-filled loops of large and small bowel again identified in the upper abdomen. Surgical clips RIGHT upper quadrant. Bones demineralized. IMPRESSION: Nasogastric tube coiled in proximal stomach. Electronically Signed   By: Lavonia Dana M.D.   On: 06/05/2018 13:02    Anti-infectives: Anti-infectives (From admission, onward)   Start  Dose/Rate Route Frequency Ordered Stop   06/06/18 0600  cefoTEtan (CEFOTAN) 2 g in sodium chloride 0.9 % 100 mL IVPB  Status:  Discontinued     2 g 200 mL/hr over 30 Minutes Intravenous On call to O.R. 06/05/18 1608 06/05/18 1624   06/06/18 0600  cefoTEtan in Dextrose 5% (CEFOTAN) IVPB 2 g     2 g 100 mL/hr over 30 Minutes Intravenous 60 min pre-op 06/05/18 1625     06/05/18 1930  piperacillin-tazobactam (ZOSYN) IVPB 3.375 g      3.375 g 12.5 mL/hr over 240 Minutes Intravenous Every 8 hours 06/05/18 1904     06/05/18 0900  piperacillin-tazobactam (ZOSYN) IVPB 3.375 g  Status:  Discontinued     3.375 g 12.5 mL/hr over 240 Minutes Intravenous Every 8 hours 06/05/18 0833 06/05/18 1904   06/03/18 1000  hydroxychloroquine (PLAQUENIL) tablet 200 mg  Status:  Discontinued     200 mg Oral 2 times daily 06/03/18 0742 06/05/18 0731   06/03/18 0730  piperacillin-tazobactam (ZOSYN) IVPB 3.375 g     3.375 g 12.5 mL/hr over 240 Minutes Intravenous  Once 06/03/18 0726 06/03/18 1246   06/03/18 0630  ciprofloxacin (CIPRO) IVPB 400 mg  Status:  Discontinued     400 mg 200 mL/hr over 60 Minutes Intravenous  Once 06/03/18 0617 06/03/18 0725   06/03/18 0630  metroNIDAZOLE (FLAGYL) IVPB 500 mg  Status:  Discontinued     500 mg 100 mL/hr over 60 Minutes Intravenous  Once 06/03/18 0617 06/03/18 0725     Assessment/Plan AKI Hypothyroidism Anxiety and depression Restless leg syndrome HLD  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 Rosendo Gros -  POD#1  -  Afebrile, VSS, leukocytosis and kidney function improving -  Continue NPO, IVF, NG tube to LIWS and await further bowel function  -  BID wet-to-dry dressing changes to midline wound  -  WOC consult for colostomy education -  PCA for pain, add IV tylenol and robaxin  -  PT eval, hopefully OOB to chair today  FEN: NPO, IVF, NG tube to LIWS  ID: Zosyn 8/8 >>  VTE: SCD's, Lovenox  Foley: continue today, follow UOP. D/C tomorrow POD#2    LOS: 2 days    [name] , [facility] 06/06/2018, 10:12 AM Pager: [phone] Consults: [phone] Mon-Fri 7:00 am-4:30 pm Sat-Sun 7:00 am-11:30 am

## 2018-06-06 NOTE — Consult Note (Signed)
[facility] Nurse ostomy consult note Stoma type/location: LLQ Colostomy  Post op day 1 Stomal assessment/size: 1 1/8" dark stoma, visualized through pouch Peristomal assessment: pouch intact Treatment options for stomal/peristomal skin: will order barrier rings Output none in pouch Ostomy pouching: 2pc. 2 1/4" will be used.  2 3/4" in place now Education provided: Spouse at bedside.  Discussed overall ostomy care.  Twice weekly pouch changes.  Emptying when 1/3 full.  Activity as tolerate.  Patient is groggy and dozing in chair. Spouse is interested and attentive.  Written materials are provided for review.  Spouse states they have a neighbor that is a Marine scientist and is interested in helping after discharge.  Enrolled patient in [place] program: No [facility] team will follow.  Domenic Moras RN BSN [place] Pager [phone]

## 2018-06-07 DIAGNOSIS — K562 Volvulus: Secondary | ICD-10-CM | POA: Diagnosis present

## 2018-06-07 DIAGNOSIS — D649 Anemia, unspecified: Secondary | ICD-10-CM | POA: Clinically undetermined

## 2018-06-07 DIAGNOSIS — K631 Perforation of intestine (nontraumatic): Secondary | ICD-10-CM | POA: Diagnosis present

## 2018-06-07 DIAGNOSIS — E876 Hypokalemia: Secondary | ICD-10-CM | POA: Clinically undetermined

## 2018-06-07 DIAGNOSIS — N179 Acute kidney failure, unspecified: Secondary | ICD-10-CM

## 2018-06-07 LAB — GLUCOSE, CAPILLARY
GLUCOSE-CAPILLARY: 109 mg/dL — AB (ref 70–99)
Glucose-Capillary: 104 mg/dL — ABNORMAL HIGH (ref 70–99)

## 2018-06-07 LAB — COMPREHENSIVE METABOLIC PANEL
ALBUMIN: 1.9 g/dL — AB (ref 3.5–5.0)
ALT: 17 U/L (ref 0–44)
ANION GAP: 10 (ref 5–15)
AST: 20 U/L (ref 15–41)
Alkaline Phosphatase: 54 U/L (ref 38–126)
BILIRUBIN TOTAL: 0.7 mg/dL (ref 0.3–1.2)
BUN: 38 mg/dL — AB (ref 8–23)
CHLORIDE: 110 mmol/L (ref 98–111)
CO2: 17 mmol/L — AB (ref 22–32)
Calcium: 7.9 mg/dL — ABNORMAL LOW (ref 8.9–10.3)
Creatinine, Ser: 1.48 mg/dL — ABNORMAL HIGH (ref 0.44–1.00)
GFR calc Af Amer: 42 mL/min — ABNORMAL LOW (ref >60)
GFR calc non Af Amer: 37 mL/min — ABNORMAL LOW (ref >60)
GLUCOSE: 116 mg/dL — AB (ref 70–99)
POTASSIUM: 3.4 mmol/L — AB (ref 3.5–5.1)
SODIUM: 137 mmol/L (ref 135–145)
TOTAL PROTEIN: 4.7 g/dL — AB (ref 6.5–8.1)

## 2018-06-07 LAB — DIFFERENTIAL
Basophils Absolute: 0.2 10*3/uL — ABNORMAL HIGH (ref 0.0–0.1)
Basophils Relative: 1 %
EOS ABS: 0.2 10*3/uL (ref 0.0–0.7)
EOS PCT: 1 %
LYMPHS ABS: 0.7 10*3/uL (ref 0.7–4.0)
Lymphocytes Relative: 4 %
MONO ABS: 1.3 10*3/uL — AB (ref 0.1–1.0)
Monocytes Relative: 8 %
NEUTROS PCT: 86 %
Neutro Abs: 14.3 10*3/uL — ABNORMAL HIGH (ref 1.7–7.7)
WBC MORPHOLOGY: INCREASED

## 2018-06-07 LAB — IRON AND TIBC
IRON: 9 ug/dL — AB (ref 28–170)
Saturation Ratios: 6 % — ABNORMAL LOW (ref 10.4–31.8)
TIBC: 147 ug/dL — AB (ref 250–450)
UIBC: 138 ug/dL

## 2018-06-07 LAB — CBC
HCT: 29.5 % — ABNORMAL LOW (ref 36.0–46.0)
HEMOGLOBIN: 9.5 g/dL — AB (ref 12.0–15.0)
MCH: 29.9 pg (ref 26.0–34.0)
MCHC: 32.2 g/dL (ref 30.0–36.0)
MCV: 92.8 fL (ref 78.0–100.0)
Platelets: 387 10*3/uL (ref 150–400)
RBC: 3.18 MIL/uL — ABNORMAL LOW (ref 3.87–5.11)
RDW: 12.6 % (ref 11.5–15.5)
WBC: 16.7 10*3/uL — ABNORMAL HIGH (ref 4.0–10.5)

## 2018-06-07 LAB — VITAMIN B12: Vitamin B-12: 652 pg/mL (ref 180–914)

## 2018-06-07 LAB — FOLATE: FOLATE: 8.5 ng/mL (ref >5.9)

## 2018-06-07 LAB — PREALBUMIN: Prealbumin: <5 mg/dL — ABNORMAL LOW (ref 18–38)

## 2018-06-07 LAB — TRIGLYCERIDES: TRIGLYCERIDES: 78 mg/dL (ref <150)

## 2018-06-07 LAB — FERRITIN: FERRITIN: 356 ng/mL — AB (ref 11–307)

## 2018-06-07 LAB — MAGNESIUM: Magnesium: 2.4 mg/dL (ref 1.7–2.4)

## 2018-06-07 LAB — PHOSPHORUS: PHOSPHORUS: 2.4 mg/dL — AB (ref 2.5–4.6)

## 2018-06-07 MED ORDER — TRAVASOL 10 % IV SOLN
INTRAVENOUS | Status: AC
  Administered 2018-06-07: 17:00:00 via INTRAVENOUS
  Filled 2018-06-07: qty 432

## 2018-06-07 MED ORDER — METOPROLOL TARTRATE 5 MG/5ML IV SOLN
5.0000 mg | Freq: Four times a day (QID) | INTRAVENOUS | Status: DC
  Administered 2018-06-07 – 2018-06-10 (×12): 5 mg via INTRAVENOUS
  Filled 2018-06-07 (×12): qty 5

## 2018-06-07 MED ORDER — POTASSIUM PHOSPHATES 15 MMOLE/5ML IV SOLN
30.0000 mmol | Freq: Once | INTRAVENOUS | Status: AC
  Administered 2018-06-07: 30 mmol via INTRAVENOUS
  Filled 2018-06-07: qty 10

## 2018-06-07 MED ORDER — POTASSIUM CHLORIDE 10 MEQ/100ML IV SOLN
10.0000 meq | INTRAVENOUS | Status: AC
  Administered 2018-06-07 (×3): 10 meq via INTRAVENOUS
  Filled 2018-06-07: qty 100

## 2018-06-07 MED ORDER — SODIUM CHLORIDE 0.9 % IV SOLN
1.0000 g | Freq: Once | INTRAVENOUS | Status: AC
  Administered 2018-06-07: 1 g via INTRAVENOUS
  Filled 2018-06-07: qty 10

## 2018-06-07 MED ORDER — FAMOTIDINE IN NACL 20-0.9 MG/50ML-% IV SOLN: 20.0000 mg | Freq: Once | INTRAVENOUS | Status: DC

## 2018-06-07 MED ORDER — DEXTROSE-NACL 5-0.9 % IV SOLN
INTRAVENOUS | Status: DC
  Administered 2018-06-08: 08:00:00 via INTRAVENOUS

## 2018-06-07 MED ORDER — INSULIN ASPART 100 UNIT/ML ~~LOC~~ SOLN
0.0000 [IU] | Freq: Four times a day (QID) | SUBCUTANEOUS | Status: DC
  Administered 2018-06-09 – 2018-06-12 (×7): 1 [IU] via SUBCUTANEOUS

## 2018-06-07 NOTE — Consult Note (Addendum)
[facility] Nurse ostomy consult note Stoma type/location: LLQ colostomy.  Pouch change and teaching with spouse at bedside.   Stomal assessment/size: 1 1/4"  Dark and moist, watching bottom hemisphere for separation Peristomal assessment: intact  Midline surgical incision  Treatment options for stomal/peristomal skin: Barrier ring Output No stool in pouch Ostomy pouching: 2pc. 2 1/4" pouch with barrier ring Education provided: Spouse at bedside and observed pouch change.  Asked questions regarding showering, activity level with ostomy (no change because of presence of colostomy) and emptying when 1/3 full.  Twice weekly pouch changes.  Enrolled patient in [place] program: Yes today [facility] team will follow.   Domenic Moras RN BSN [place] Pager [phone]

## 2018-06-07 NOTE — Progress Notes (Signed)
PROGRESS NOTE    [name]  WUJ:[id number] DOB: January 29, 1956 DOA: 06/03/2018 PCP: Kathyrn Lass, MD    Brief Narrative:  [name] is an 62 y.o. female with a Past Medical History of uterine CA; RLS; PAT; hypothyroidism; fibromyalgia/chronic fatigue (not on chronic opiates); and leukocytoclastic vasculitis who presents with severe abdominal pain x 2 days with n/v. Patient has a h/o constipation and takes Linzess prn. Her last BM was Wednesday. She was found to have a stercoral ulcer with surrounding colitis and moderate stool retention.  Did not respond to enemas and on the AM of 8/7 patient's conditioned worsened with peritonitis.  KUB showed air fluid levels, CT scan showed volvulus and patient was taken for an ex lap and eventual sigmoid colectomy/colostomy.  Recovery is expected to be long with anticipated ileus and possible abdominal abscess.     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 -Wound care following. -Patient status post PICC line and on TPN.  Continue empiric IV Zosyn. -General surgery following and managing. -course/hospitalization most likely will be complicated with possible ileus/abdominal abscess  -PCA per general surgery  Leukocytosis -Questionable etiology.  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  Leukocytosis trending down.  Blood cultures with no growth to date.  Patient afebrile.  Urinalysis with negative nitrites, negative leukocytes.  Continue IV Zosyn.    AKI -Likely secondary to prerenal azotemia.  Improving with  gentle hydration.  Follow.   Hypothyroidism: Continue IV Synthroid.  Outpatient follow-up.  History of vasculitis of the skin,followed by dermatology. The patient is on methotrexate ,leucovorin weekly, plaquenil -Patient currently n.p.o.  Could resume oral medications once tolerating oral intake.   History of cystitis, on [place] D/c PO meds  Anxiety and Depression Oral meds on hold.  Restless leg syndrome Patient currently n.p.o.  Continue to hold oral meds until patient on a diet.    Hypertension Change Toprol-XL to IV Lopressor and titrate as needed.   Hyperlipidemia Continue to hold oral meds until patient has been started on a diet and tolerating oral intake.   Elevated Glucose  HgbA1c <6  Hypokalemia -We will give a few rounds of IV potassium.  Follow.     DVT prophylaxis: Lovenox Code Status: DNR Family Communication: Updated patient and husband at bedside. Disposition Plan: To be determined.   Consultants:   Gastroenterology: Dr. Michail Sermon 06/03/2018  General surgery: Dr. Rosendo Gros 06/05/2018  Procedures:  Renal ultrasound 06/05/2018  Acute abdominal series 06/05/2018  Abdominal film 06/05/2018  CT abdomen and pelvis 06/03/2018, 06/05/2018  Exploratory laparotomy, sigmoid colectomy, colostomy per Drs. Ramirez/Cornett 06/05/2018  Antimicrobials:   IV Zosyn 06/03/2018>>>>   Subjective: Denies any nausea or emesis.  No significant overt bleeding.  No flatus.  No significant stool noted in colostomy per patient which was recently changed with very minimal blood noted.  No chest pain.  No shortness of breath.  Objective: Vitals:   06/07/18 0350 06/07/18 0443 06/07/18 0816 06/07/18 1028  BP:  125/62    Pulse:  90    Resp: 18 16 16  Temp:  98 F (36.7 C)    TempSrc:  Oral    SpO2: 96% 97% 97% 96%  Weight:      Height:        Intake/Output Summary (Last 24 hours) at 06/07/2018 1201 Last data filed at 06/07/2018 0845 Gross per 24 hour  Intake 2118.52  ml  Output 1050 ml  Net 1068.52 ml   Filed Weights   06/03/18 0118 06/03/18 0830 06/05/18 0648  Weight: 74.8 kg 75.3 kg 82.2 kg    Examination:  General exam: Appears calm and comfortable.  NG tube in place. Respiratory system: Clear to auscultation anterior lung fields.  No wheezes, no crackles, no rhonchi.  Respiratory effort normal. Cardiovascular system: S1 & S2 heard, RRR. No JVD, murmurs, rubs, gallops or clicks. No pedal edema. Gastrointestinal system: Abdomen is diffusely tender to palpation, soft, nondistended.  Colostomy intact.  Central nervous system: Alert and oriented. No focal neurological deficits. Extremities: Symmetric 5 x 5 power. Skin: No rashes, lesions or ulcers Psychiatry: Judgement and insight appear normal. Mood & affect appropriate.     Data Reviewed: I have personally reviewed following labs and imaging studies  CBC: Recent Labs  Lab 06/03/18 0140 06/04/18 0445 06/05/18 0550 06/06/18 0444 06/07/18 0406  WBC 16.6* 31.6* 32.6* 18.1* 16.7*  NEUTROABS  --  29.0*  --   --  14.3*  HGB 14.1 12.5 11.2* 12.6 9.5*  HCT 41.9 36.6 34.2* 37.8 29.5*  MCV 91.9 90.8 92.2 92.0 92.8  PLT 380 334 323 450* 185   Basic Metabolic Panel: Recent Labs  Lab 06/03/18 0140 06/04/18 0445 06/05/18 0550 06/06/18 0444 06/07/18 0406  NA 139 135 133* 135 137  K 3.3* 3.5 3.3* 3.7 3.4*  CL 104 102 105 107 110  CO2 23 21* 16* 16* 17*  GLUCOSE 165* 123* 108* 168* 116*  BUN 22 27* 31* 33* 38*  CREATININE 0.84 1.42* 1.81* 1.42* 1.48*  CALCIUM 9.1 8.6* 8.5* 8.1* 7.9*  MG  --  1.7  --   --  2.4  PHOS  --   --   --   --  2.4*   GFR: Estimated Creatinine Clearance: 41.2 mL/min (A) (by C-G formula based on SCr of 1.48 mg/dL (H)). Liver Function Tests: Recent Labs  Lab 06/03/18 0140 06/07/18 0406  AST 20 20  ALT 21 17  ALKPHOS 91 54  BILITOT 1.1 0.7  PROT 6.9 4.7*  ALBUMIN 3.9 1.9*   Recent Labs  Lab 06/03/18 0140  LIPASE 42   No results for input(s): AMMONIA  in the last 168 hours. Coagulation Profile: No results for input(s): INR, PROTIME in the last 168 hours. Cardiac Enzymes: No results for input(s): CKTOTAL, CKMB, CKMBINDEX, TROPONINI in the last 168 hours. BNP (last 3 results) No results for input(s): PROBNP in the last 8760 hours. HbA1C: No results for input(s): HGBA1C in the last 72 hours. CBG: No results for input(s): GLUCAP in the last 168 hours. Lipid Profile: Recent Labs    06/07/18 0406  TRIG 78   Thyroid Function Tests: No results for input(s): TSH, T4TOTAL, FREET4, T3FREE, THYROIDAB in the last 72 hours. Anemia Panel: Recent Labs    06/07/18 0406  VITAMINB12 652  FOLATE 8.5  TIBC 147*  IRON 9*   Sepsis Labs: Recent Labs  Lab 06/03/18 0918  LATICACIDVEN 1.4    Recent Results (from the past 240 hour(s))  Culture, blood (routine x 2)     Status: None (Preliminary result)   Collection  Time: 06/03/18  9:20 AM  Result Value Ref Range Status   Specimen Description BLOOD LEFT ANTECUBITAL  Final   Special Requests   Final    BOTTLES DRAWN AEROBIC ONLY Blood Culture adequate volume   Culture   Final    NO GROWTH 3 DAYS Performed at [facility], [address], [place], [place] [address]    Report Status PENDING  Incomplete  Culture, blood (routine x 2)     Status: None (Preliminary result)   Collection Time: 06/03/18  9:30 AM  Result Value Ref Range Status   Specimen Description BLOOD BLOOD LEFT HAND  Final   Special Requests   Final    BOTTLES DRAWN AEROBIC ONLY Blood Culture adequate volume   Culture   Final    NO GROWTH 3 DAYS Performed at [facility], [place] [address] [place], [place] [address]    Report Status PENDING  Incomplete  Surgical pcr screen     Status: None   Collection Time: 06/05/18  4:27 PM  Result Value Ref Range Status   MRSA, PCR NEGATIVE NEGATIVE Final   Staphylococcus aureus NEGATIVE NEGATIVE Final    Comment: (NOTE) The Xpert SA Assay (FDA approved for NASAL specimens  in patients 10 years of age and older), is one component of a comprehensive surveillance program. It is not intended to diagnose infection nor to guide or monitor treatment. Performed at [facility], [place] [address], [place],  [address]          Radiology Studies: Ct Abdomen Pelvis Wo Contrast  Result Date: 06/05/2018 CLINICAL DATA:  62 year old female with a history of abdominal pain with nausea vomiting. Constipation. EXAM: CT ABDOMEN AND PELVIS WITHOUT CONTRAST TECHNIQUE: Multidetector CT imaging of the abdomen and pelvis was performed following the standard protocol without IV contrast. COMPARISON:  06/03/2018 FINDINGS: Lower chest: Trace atelectasis and pleural fluid at the bilateral lung bases. Hepatobiliary: Cranial caudal span of the right liver measures 16.4 cm. Surgical changes of cholecystectomy. Pancreas: Unremarkable pancreas. Spleen: Unremarkable spleen Adrenals/Urinary Tract: Bilateral adrenal glands unremarkable. There has been delayed excretion from the right kidney collecting system with mild hydronephrosis. Contrast present through the length of the proximal and mid segment of the right ureter. Left kidney demonstrates near complete evacuation/excretion of contrast from the collecting system and the left ureter. Urinary bladder demonstrates retention of some contrast. Stomach/Bowel: Gastric tube terminates within the stomach. Stomach is decompressed. Distended loops of small bowel with associated air-fluid levels. Since the prior CT there has been significant fluid accumulation throughout the length of the colon, with distention of cecum, right colon, transverse colon and descending colon. There is similar configuration of formed stool within the sigmoid colon, with a loop of sigmoid colon and associated twisted mesentery of the low abdomen/pelvis. The distal rectum is decompressed. Crescent of air along the anterior aspect of the sigmoid colon on image 70 favored to be  within the lumen of bowel. Vascular/Lymphatic: Mild atherosclerosis of the abdominal aorta. No adenopathy. Small volume of free fluid within the left pelvis, present on the comparison CT, likely reactive. Reproductive: Hysterectomy Other: Small fat containing umbilical hernia. Musculoskeletal: No acute displaced fracture. Degenerative changes of the spine. IMPRESSION: CT demonstrates sigmoid volvulus with signs of worsening bowel obstruction. Crescent of air at the anterior aspect of stool-filled sigmoid colon I would favored to be within bowel lumen, as there are no other signs of bowel perforation on the CT. These results were discussed  at the time of interpretation on 06/05/2018 at 3:41 pm with Dr. Ralene Ok. Delayed excretion of contrast from the right collecting system, potentially secondary to distal inflammation or compression of the right ureter secondary to pelvic changes. Electronically Signed   By: Corrie Mckusick D.O.   On: 06/05/2018 15:43   Dg Abd Portable 1v  Result Date: 06/05/2018 CLINICAL DATA:  Nasogastric tube position EXAM: PORTABLE ABDOMEN - 1 VIEW COMPARISON:  Portable exam 1206 hours compared to 06/05/2018 at 0827 hours FINDINGS: Nasogastric tube coiled in proximal stomach with tip near fundus. Air-filled loops of large and small bowel again identified in the upper abdomen. Surgical clips RIGHT upper quadrant. Bones demineralized. IMPRESSION: Nasogastric tube coiled in proximal stomach. Electronically Signed   By: Lavonia Dana M.D.   On: 06/05/2018 13:02   [place] Ekg Site Rite  Result Date: 06/06/2018 If Site Rite image not attached, placement could not be confirmed due to current cardiac rhythm.       Scheduled Meds: . cefoTEtan (CEFOTAN) IV  2 g Intravenous 60 min Pre-Op  . enoxaparin (LOVENOX) injection  40 mg Subcutaneous Daily  . HYDROmorphone   Intravenous Q4H  . insulin aspart  0-9 Units Subcutaneous Q6H  . levothyroxine  68.5 mcg Intravenous Daily  . metoprolol  tartrate  5 mg Intravenous Q6H  . mometasone-formoterol  2 puff Inhalation BID  . sodium chloride flush  10-40 mL Intracatheter Q12H   Continuous Infusions: . calcium gluconate    . dextrose 5 % and 0.9% NaCl 125 mL/hr at 06/06/18 1735  . dextrose 5 % and 0.9% NaCl    . famotidine (PEPCID) IV    . methocarbamol (ROBAXIN) IV 500 mg (06/07/18 0021)  . ondansetron [facility]) IV 8 mg (06/07/18 0601)  . piperacillin-tazobactam (ZOSYN)  IV 3.375 g (06/07/18 0333)  . potassium chloride 10 mEq (06/07/18 1102)  . potassium PHOSPHATE IVPB (in mmol)    . TPN ADULT (ION)       LOS: 3 days    Time spent: 40 minutes    Irine Seal, MD [facility] Pager [phone]  If 7PM-7AM, please contact night-coverage [email or web address] Password [email or web address] 06/07/2018, 12:01 PM

## 2018-06-07 NOTE — Progress Notes (Signed)
2 Days Post-Op   Subjective/Chief Complaint: Pt doing well   Objective: Vital signs in last 24 hours: Temp:  [98 F (36.7 C)-98.8 F (37.1 C)] 98 F (36.7 C) (08/09 0443) Pulse Rate:  [90-100] 90 (08/09 0443) Resp:  [15-21] 16 (08/09 0816) BP: (119-133)/(58-68) 125/62 (08/09 0443) SpO2:  [93 %-98 %] 97 % (08/09 0816) Last BM Date: 06/04/18  Intake/Output from previous day: 08/08 0701 - 08/09 0700 In: 2118.5 [I.V.:1250; IV Piggyback:868.5] Out: 950 [Urine:600; Emesis/NG output:300; Drains:50] Intake/Output this shift: No intake/output data recorded.  General appearance: alert and cooperative GI: soft, nttp, midline wound dressed, ostomy patent/bruised, Jp SS  Lab Results:  Recent Labs    06/06/18 0444 06/07/18 0406  WBC 18.1* 16.7*  HGB 12.6 9.5*  HCT 37.8 29.5*  PLT 450* 387   BMET Recent Labs    06/06/18 0444 06/07/18 0406  NA 135 137  K 3.7 3.4*  CL 107 110  CO2 16* 17*  GLUCOSE 168* 116*  BUN 33* 38*  CREATININE 1.42* 1.48*  CALCIUM 8.1* 7.9*   PT/INR No results for input(s): LABPROT, INR in the last 72 hours. ABG No results for input(s): PHART, HCO3 in the last 72 hours.  Invalid input(s): PCO2, PO2  Studies/Results: Ct Abdomen Pelvis Wo Contrast  Result Date: 06/05/2018 CLINICAL DATA:  62 year old female with a history of abdominal pain with nausea vomiting. Constipation. EXAM: CT ABDOMEN AND PELVIS WITHOUT CONTRAST TECHNIQUE: Multidetector CT imaging of the abdomen and pelvis was performed following the standard protocol without IV contrast. COMPARISON:  06/03/2018 FINDINGS: Lower chest: Trace atelectasis and pleural fluid at the bilateral lung bases. Hepatobiliary: Cranial caudal span of the right liver measures 16.4 cm. Surgical changes of cholecystectomy. Pancreas: Unremarkable pancreas. Spleen: Unremarkable spleen Adrenals/Urinary Tract: Bilateral adrenal glands unremarkable. There has been delayed excretion from the right kidney collecting  system with mild hydronephrosis. Contrast present through the length of the proximal and mid segment of the right ureter. Left kidney demonstrates near complete evacuation/excretion of contrast from the collecting system and the left ureter. Urinary bladder demonstrates retention of some contrast. Stomach/Bowel: Gastric tube terminates within the stomach. Stomach is decompressed. Distended loops of small bowel with associated air-fluid levels. Since the prior CT there has been significant fluid accumulation throughout the length of the colon, with distention of cecum, right colon, transverse colon and descending colon. There is similar configuration of formed stool within the sigmoid colon, with a loop of sigmoid colon and associated twisted mesentery of the low abdomen/pelvis. The distal rectum is decompressed. Crescent of air along the anterior aspect of the sigmoid colon on image 70 favored to be within the lumen of bowel. Vascular/Lymphatic: Mild atherosclerosis of the abdominal aorta. No adenopathy. Small volume of free fluid within the left pelvis, present on the comparison CT, likely reactive. Reproductive: Hysterectomy Other: Small fat containing umbilical hernia. Musculoskeletal: No acute displaced fracture. Degenerative changes of the spine. IMPRESSION: CT demonstrates sigmoid volvulus with signs of worsening bowel obstruction. Crescent of air at the anterior aspect of stool-filled sigmoid colon I would favored to be within bowel lumen, as there are no other signs of bowel perforation on the CT. These results were discussed at the time of interpretation on 06/05/2018 at 3:41 pm with Dr. Ralene Ok. Delayed excretion of contrast from the right collecting system, potentially secondary to distal inflammation or compression of the right ureter secondary to pelvic changes. Electronically Signed   By: Corrie Mckusick D.O.   On: 06/05/2018 15:43  Dg Abd Portable 1v  Result Date: 06/05/2018 CLINICAL DATA:   Nasogastric tube position EXAM: PORTABLE ABDOMEN - 1 VIEW COMPARISON:  Portable exam 1206 hours compared to 06/05/2018 at 0827 hours FINDINGS: Nasogastric tube coiled in proximal stomach with tip near fundus. Air-filled loops of large and small bowel again identified in the upper abdomen. Surgical clips RIGHT upper quadrant. Bones demineralized. IMPRESSION: Nasogastric tube coiled in proximal stomach. Electronically Signed   By: Lavonia Dana M.D.   On: 06/05/2018 13:02   [place] Ekg Site Rite  Result Date: 06/06/2018 If Site Rite image not attached, placement could not be confirmed due to current cardiac rhythm.   Anti-infectives: Anti-infectives (From admission, onward)   Start     Dose/Rate Route Frequency Ordered Stop   06/06/18 0600  cefoTEtan (CEFOTAN) 2 g in sodium chloride 0.9 % 100 mL IVPB  Status:  Discontinued     2 g 200 mL/hr over 30 Minutes Intravenous On call to O.R. 06/05/18 1608 06/05/18 1624   06/06/18 0600  cefoTEtan in Dextrose 5% (CEFOTAN) IVPB 2 g     2 g 100 mL/hr over 30 Minutes Intravenous 60 min pre-op 06/05/18 1625     06/05/18 1930  piperacillin-tazobactam (ZOSYN) IVPB 3.375 g     3.375 g 12.5 mL/hr over 240 Minutes Intravenous Every 8 hours 06/05/18 1904     06/05/18 0900  piperacillin-tazobactam (ZOSYN) IVPB 3.375 g  Status:  Discontinued     3.375 g 12.5 mL/hr over 240 Minutes Intravenous Every 8 hours 06/05/18 0833 06/05/18 1904   06/03/18 1000  hydroxychloroquine (PLAQUENIL) tablet 200 mg  Status:  Discontinued     200 mg Oral 2 times daily 06/03/18 0742 06/05/18 0731   06/03/18 0730  piperacillin-tazobactam (ZOSYN) IVPB 3.375 g     3.375 g 12.5 mL/hr over 240 Minutes Intravenous  Once 06/03/18 0726 06/03/18 1246   06/03/18 0630  ciprofloxacin (CIPRO) IVPB 400 mg  Status:  Discontinued     400 mg 200 mL/hr over 60 Minutes Intravenous  Once 06/03/18 0617 06/03/18 0725   06/03/18 0630  metroNIDAZOLE (FLAGYL) IVPB 500 mg  Status:  Discontinued     500 mg 100  mL/hr over 60 Minutes Intravenous  Once 06/03/18 0617 06/03/18 0725      Assessment/Plan: s/p Procedure(s): EXPLORATORY LAPAROTOMY AND PARTIAL COLECTOMY (N/A) COLOSTOMY (Left) AKI Hypothyroidism Anxiety and depression Restless leg syndrome HLD  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 Rosendo Gros -  POD#2  -  Afebrile, VSS, leukocytosis trending down and kidney stable -  Continue NPO, IVF, NG tube to LIWS and await further bowel function  -  BID wet-to-dry dressing changes to midline wound  -  PCA for pain, add IV tylenol and robaxin  -  PT eval, hopefully OOB to chair today  FEN: NPO, IVF, NG tube to LIWS  ID: Zosyn 8/8 >>  VTE: SCD's, Lovenox  Foley: DC today    LOS: 3 days    Rosario Jacks., Anne Hahn 06/07/2018

## 2018-06-07 NOTE — Progress Notes (Signed)
Initial Nutrition Assessment  DOCUMENTATION CODES:   Obesity unspecified  INTERVENTION:   -TPN management per pharmacy -RD will follow for diet advancement and supplement as appropriate  NUTRITION DIAGNOSIS:   Inadequate oral intake related to altered GI function as evidenced by NPO status.  GOAL:   Patient will meet greater than or equal to 90% of their needs  MONITOR:   Diet advancement, Labs, Weight trends, Skin, I & O's  REASON FOR ASSESSMENT:   Consult New TPN/TNA  ASSESSMENT:   [name] is a 62 y.o. female with a Past Medical History of uterine CA; RLS; PAT; hypothyroidism; fibromyalgia/chronic fatigue (not on chronic opiates); and leukocytoclastic vasculitis who presents with severe abdominal pain x 2 days with n/v.    Pt admitted with fecal impaction.   8/7- NGT placed; CT revealed sigmoid volvulus; s/p ex-lap. Sigmoid colectomy with colostomy 8/8- PICC placed  Reviewed I/O's: +1.2 L x 24 hours and +9.7 L since admission. 300 ml NGT output x 24 hours  Case discussed with RN prior to visit, who confirms plan to start TPN tonight.   Spoke with pt at bedside, who reports decline in appetite over the past 3 months. Pt would typically consume breakfast of eggs and toast and lunch of soup and sandwich. She endorses she has lost about 20-30# since the beginning of the year, however, this is not consistent with wt hx. Pt reports she has been more weak over the past several months, but has been forcing herself to be active.   Pt understands rationale for NPO diet order. Explained to pt how she will receive nutrition via TPN and PICC line.   Per pharmacy note, plan to initiate TPN @ 30 ml/hr, which will provide 660 kcals and 43 grams protein. This meets 37% of estimated kcal needs and 37% of estimated protein needs.  Labs reviewed: K: 3.4 (on IV supplementation).   NUTRITION - FOCUSED PHYSICAL EXAM:    Most Recent Value  Orbital Region  No depletion  Upper Arm  Region  No depletion  Thoracic and Lumbar Region  No depletion  Buccal Region  No depletion  Temple Region  No depletion  Clavicle Bone Region  No depletion  Clavicle and Acromion Bone Region  No depletion  Scapular Bone Region  No depletion  Dorsal Hand  No depletion  Patellar Region  No depletion  Anterior Thigh Region  No depletion  Posterior Calf Region  No depletion  Edema (RD Assessment)  None  Hair  Reviewed  Eyes  Reviewed  Mouth  Reviewed  Skin  Reviewed  Nails  Reviewed       Diet Order:   Diet Order            Diet NPO time specified  Diet effective now              EDUCATION NEEDS:   Education needs have been addressed  Skin:  Skin Assessment: Skin Integrity Issues: Skin Integrity Issues:: Incisions Incisions: closed abdominal incision  Last BM:  06/04/18 (no colostomy output yet)  Height:   Ht Readings from Last 1 Encounters:  06/03/18 5\' 5"  (1.651 m)    Weight:   Wt Readings from Last 1 Encounters:  06/05/18 82.2 kg    Ideal Body Weight:  56.8 kg  BMI:  Body mass index is 30.16 kg/m.  Estimated Nutritional Needs:   Kcal:  1800-2000  Protein:  115-130 grams  Fluid:  > 1.8 L    Belanna Manring A.  Jimmye Norman, RD, LDN, CDE Pager: [phone] After hours Pager: [phone]

## 2018-06-07 NOTE — Progress Notes (Signed)
PHARMACY - ADULT TOTAL PARENTERAL NUTRITION CONSULT NOTE   Pharmacy Consult:  TPN Indication:  Possible ileus  Patient Measurements: Height: 5\' 5"  (165.1 cm) Weight: 181 lb 3.5 oz (82.2 kg) IBW/kg (Calculated) : 57 TPN AdjBW (KG): 61.6 Body mass index is 30.16 kg/m.  Assessment:  50 YOF admitted on 06/03/18 with severe abdominal pain with N&V for 3 days.  PMH significant for IBS and chronic constipation.  She was found to have sigmoid volvulus with bowel necrosis and underwent ex-lap on 06/05/18.  Patient has been essentially NPO since 05/31/18 and Pharmacy asked to manage TPN.  Patient was eating regularly prior to illness.  GI: ileus.  BL prealbumin < 5, NG O/P 39mL, drain O/P 380mL.  Scheduled Zofran (last QTc 429ms) Endo: IV Synthroid.  A1c 5.3% - AM glucose controlled Insulin requirements in the past 24 hours: N/A Lytes: K 3.4 (goal >/= 4 for ileus, 3 runs ordered), Phos 2.4 (KPhos 30 mmol ordered), low CO2, Mag high normal post 2gm on 8/7 Renal: SCr 1.48 (BL SCr 0.8), BUN 38, U/S negative - UOP 0.4 ml/kg/hr, D5NS at 125 ml/hr Pulm: stable on RA - Dulera Cards: VSS Hepatobil: LFTs / tbili / TG WNL Neuro: Dilaudid PCA, Robaxin - pain score 0-4 ID: Zosyn (8/7 >> ) for IAI - afebrile, WBC down 16.7, BCx NGTD TPN Access: PICC placed 8/8/1 TPN start date: 06/07/18   Nutritional Goals (RD rec pending): 1500-1600 kCal, 90-105gm protein, ~1.8L per day  Current Nutrition:  NPO   Plan:  Initiate TPN at 30 ml/hr (goal rate ~70 ml/hr) TPN will provide 43g AA, 101g CHO and 14g ILE for a total of 660 kCal, meeting 43% of needs Electrolytes in TPN: standard except max acetate Daily multivitamin and trace elements in TPN D/C Pepcid bolus and add 20mg  in daily TPN Start sensitive SSI Q6H Reduce D5NS to 95 ml/hr when TPN starts Standard TPN labs and nursing care orders Pharmacy will sign off of Zosyn dosing and follow peripherally.  Thank you for the consult!   Kamillah Didonato D. Mina Marble, PharmD, BCPS,  [place] 06/07/2018, 9:06 AM

## 2018-06-08 LAB — BASIC METABOLIC PANEL
Anion gap: 7 (ref 5–15)
BUN: 25 mg/dL — AB (ref 8–23)
CO2: 20 mmol/L — ABNORMAL LOW (ref 22–32)
Calcium: 7.8 mg/dL — ABNORMAL LOW (ref 8.9–10.3)
Chloride: 112 mmol/L — ABNORMAL HIGH (ref 98–111)
Creatinine, Ser: 0.92 mg/dL (ref 0.44–1.00)
GFR calc Af Amer: >60 mL/min (ref >60)
GLUCOSE: 117 mg/dL — AB (ref 70–99)
POTASSIUM: 3.4 mmol/L — AB (ref 3.5–5.1)
SODIUM: 139 mmol/L (ref 135–145)

## 2018-06-08 LAB — CULTURE, BLOOD (ROUTINE X 2)
CULTURE: NO GROWTH
Culture: NO GROWTH
SPECIAL REQUESTS: ADEQUATE
Special Requests: ADEQUATE

## 2018-06-08 LAB — GLUCOSE, CAPILLARY
GLUCOSE-CAPILLARY: 104 mg/dL — AB (ref 70–99)
GLUCOSE-CAPILLARY: 109 mg/dL — AB (ref 70–99)
GLUCOSE-CAPILLARY: 115 mg/dL — AB (ref 70–99)
Glucose-Capillary: 124 mg/dL — ABNORMAL HIGH (ref 70–99)

## 2018-06-08 LAB — CBC
HCT: 28.1 % — ABNORMAL LOW (ref 36.0–46.0)
Hemoglobin: 9.4 g/dL — ABNORMAL LOW (ref 12.0–15.0)
MCH: 30.8 pg (ref 26.0–34.0)
MCHC: 33.5 g/dL (ref 30.0–36.0)
MCV: 92.1 fL (ref 78.0–100.0)
PLATELETS: 392 10*3/uL (ref 150–400)
RBC: 3.05 MIL/uL — AB (ref 3.87–5.11)
RDW: 13.1 % (ref 11.5–15.5)
WBC: 17.7 10*3/uL — ABNORMAL HIGH (ref 4.0–10.5)

## 2018-06-08 LAB — MAGNESIUM: MAGNESIUM: 2.1 mg/dL (ref 1.7–2.4)

## 2018-06-08 LAB — PHOSPHORUS: Phosphorus: 2.8 mg/dL (ref 2.5–4.6)

## 2018-06-08 MED ORDER — POTASSIUM CHLORIDE 2 MEQ/ML IV SOLN
INTRAVENOUS | Status: AC
  Filled 2018-06-08 (×2): qty 1000

## 2018-06-08 MED ORDER — POTASSIUM CHLORIDE 2 MEQ/ML IV SOLN
INTRAVENOUS | Status: AC
  Administered 2018-06-08: 10:00:00 via INTRAVENOUS
  Filled 2018-06-08: qty 1000

## 2018-06-08 MED ORDER — POTASSIUM CHLORIDE 10 MEQ/50ML IV SOLN
10.0000 meq | INTRAVENOUS | Status: AC
  Administered 2018-06-08 (×3): 10 meq via INTRAVENOUS
  Filled 2018-06-08 (×3): qty 50

## 2018-06-08 MED ORDER — POTASSIUM CHLORIDE 2 MEQ/ML IV SOLN: INTRAVENOUS | Status: DC

## 2018-06-08 MED ORDER — TRAVASOL 10 % IV SOLN
INTRAVENOUS | Status: AC
  Administered 2018-06-08: 18:00:00 via INTRAVENOUS
  Filled 2018-06-08: qty 831.6

## 2018-06-08 MED ORDER — POTASSIUM PHOSPHATES 15 MMOLE/5ML IV SOLN
15.0000 mmol | Freq: Once | INTRAVENOUS | Status: AC
  Administered 2018-06-08: 15 mmol via INTRAVENOUS
  Filled 2018-06-08: qty 5

## 2018-06-08 NOTE — Progress Notes (Addendum)
3 Days Post-Op  Subjective: Alert and cooperative.  Oriented.  Minimal distress NG in place No flatus or stool per ostomy yet Afebrile.  Heart rate 81.  BP 129/63.  SPO2 97%  Potassium 3.4.  Glucose 117.  Creatinine 0.92.  WBC 17,700.  Hemoglobin 9.4, stable On TNA per pharmacy.  30 cc/h.  Objective: Vital signs in last 24 hours: Temp:  [97.9 F (36.6 C)-99.9 F (37.7 C)] 98.8 F (37.1 C) (08/10 0617) Pulse Rate:  [80-91] 81 (08/10 0617) Resp:  [16-20] 17 (08/10 0617) BP: (129-142)/(63-73) 129/63 (08/10 0617) SpO2:  [96 %-98 %] 97 % (08/10 0617) Weight:  [82.2 kg] 82.2 kg (08/10 0500) Last BM Date: 06/04/18  Intake/Output from previous day: 08/09 0701 - 08/10 0700 In: 3144.5 [I.V.:1779; IV Piggyback:1365.5] Out: 910 [Urine:300; Emesis/NG output:550; Drains:60] Intake/Output this shift: No intake/output data recorded.  General appearance: Pleasant and oriented.  Minimal distress. Resp: clear to auscultation bilaterally GI: Soft.  Obese.  Minimal tenderness.  Midline wound clean and packed open.  Ostomy pink.  No stool or flatus in bag.  JP serosanguineous  Lab Results:  Results for orders placed or performed during the hospital encounter of 06/03/18 (from the past 24 hour(s))  Glucose, capillary     Status: Abnormal   Collection Time: 06/07/18  5:25 PM  Result Value Ref Range   Glucose-Capillary 104 (H) 70 - 99 mg/dL  Glucose, capillary     Status: Abnormal   Collection Time: 06/07/18 11:56 PM  Result Value Ref Range   Glucose-Capillary 109 (H) 70 - 99 mg/dL  CBC     Status: Abnormal   Collection Time: 06/08/18  4:08 AM  Result Value Ref Range   WBC 17.7 (H) 4.0 - 10.5 K/uL   RBC 3.05 (L) 3.87 - 5.11 MIL/uL   Hemoglobin 9.4 (L) 12.0 - 15.0 g/dL   HCT 28.1 (L) 36.0 - 46.0 %   MCV 92.1 78.0 - 100.0 fL   MCH 30.8 26.0 - 34.0 pg   MCHC 33.5 30.0 - 36.0 g/dL   RDW 13.1 11.5 - 15.5 %   Platelets 392 150 - 400 K/uL  Basic metabolic panel     Status: Abnormal    Collection Time: 06/08/18  4:08 AM  Result Value Ref Range   Sodium 139 135 - 145 mmol/L   Potassium 3.4 (L) 3.5 - 5.1 mmol/L   Chloride 112 (H) 98 - 111 mmol/L   CO2 20 (L) 22 - 32 mmol/L   Glucose, Bld 117 (H) 70 - 99 mg/dL   BUN 25 (H) 8 - 23 mg/dL   Creatinine, Ser 0.92 0.44 - 1.00 mg/dL   Calcium 7.8 (L) 8.9 - 10.3 mg/dL   GFR calc non Af Amer >60 >60 mL/min   GFR calc Af Amer >60 >60 mL/min   Anion gap 7 5 - 15  Magnesium     Status: None   Collection Time: 06/08/18  4:08 AM  Result Value Ref Range   Magnesium 2.1 1.7 - 2.4 mg/dL  Phosphorus     Status: None   Collection Time: 06/08/18  4:08 AM  Result Value Ref Range   Phosphorus 2.8 2.5 - 4.6 mg/dL  Glucose, capillary     Status: Abnormal   Collection Time: 06/08/18  6:16 AM  Result Value Ref Range   Glucose-Capillary 124 (H) 70 - 99 mg/dL     Studies/Results: No results found.  . cefoTEtan (CEFOTAN) IV  2 g Intravenous 60 min Pre-Op  .  enoxaparin (LOVENOX) injection  40 mg Subcutaneous Daily  . HYDROmorphone   Intravenous Q4H  . insulin aspart  0-9 Units Subcutaneous Q6H  . levothyroxine  68.5 mcg Intravenous Daily  . metoprolol tartrate  5 mg Intravenous Q6H  . mometasone-formoterol  2 puff Inhalation BID  . 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 Rosendo Gros -  POD#3 -  Afebrile, VSS, leukocytosis and creat stable -High risk for abscess and ileus. -  Continue NPO, IVF, NG tube to LIWS and await further bowel function  -  BID wet-to-dry dressing changes to midline wound  -  PCA for pain, add IV tylenol and robaxin  -  PT eval, hopefully ambulate today  Hypokalemia-IVs adjusted a bit.  Lab tomorrow  FEN:  NPO, IVF, NG tube to LIWS  ID: Zosyn 8/8 >>  VTE: SCD's, Lovenox  Foley: DC 8/9 - voiding  @PROBHOSP @  LOS: 4 days    [name] [name] 06/08/2018  . .prob

## 2018-06-08 NOTE — Progress Notes (Signed)
PROGRESS NOTE    [name]  ENI:[id number] DOB: [birth year] DOA: 06/03/2018 PCP: Kathyrn Lass, MD    Brief Narrative:  [name] is an 62 y.o. female with a Past Medical History of uterine CA; RLS; PAT; hypothyroidism; fibromyalgia/chronic fatigue (not on chronic opiates); and leukocytoclastic vasculitis who presents with severe abdominal pain x 2 days with n/v. Patient has a h/o constipation and takes Linzess prn. Her last BM was Wednesday. She was found to have a stercoral ulcer with surrounding colitis and moderate stool retention.  Did not respond to enemas and on the AM of 8/7 patient's conditioned worsened with peritonitis.  KUB showed air fluid levels, CT scan showed volvulus and patient was taken for an ex lap and eventual sigmoid colectomy/colostomy.  Recovery is expected to be long with anticipated ileus and possible abdominal abscess.     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 -Wound care following. -Patient status post PICC line and on TPN.  Continue empiric IV Zosyn. -General surgery following and managing. -course/hospitalization most likely will be complicated with possible ileus/abdominal abscess  -PCA per general surgery  Leukocytosis -Questionable etiology.  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  Leukocytosis actuating.  Patient currently afebrile.  Blood cultures with no growth to date.  Urinalysis unremarkable.  Continue empiric IV Zosyn.  AKI -Likely secondary to prerenal  azotemia.  Improved with gentle hydration.  Follow.   Hypothyroidism: Continue IV Synthroid.  Outpatient follow-up.  History of vasculitis of the skin,followed by dermatology. The patient is on methotrexate ,leucovorin weekly, plaquenil -Patient currently n.p.o.  Could resume oral medications once tolerating oral intake.   History of cystitis, on [place] Likely resume p.o. medications when bowels are moving and tolerating oral intake.   Anxiety and Depression Oral meds on hold.  Restless leg syndrome Patient currently n.p.o.  Continue to hold oral meds until patient on a diet.    Hypertension Continue IV Lopressor for blood pressure control.  Once tolerating a diet could transition back to home dose oral Toprol-XL.   Hyperlipidemia Continue to hold oral meds until patient has been started on a diet and tolerating oral intake.   Elevated Glucose  HgbA1c <6  Hypokalemia -Replete potassium per pharmacy to keep potassium greater than 4 due to ileus.  Keep magnesium greater than 2.  Follow.     DVT prophylaxis: Lovenox Code Status: DNR Family Communication: Updated patient and family at bedside. Disposition Plan: To be determined.  Per general surgery.   Consultants:   Gastroenterology: Dr. Michail Sermon 06/03/2018  General surgery: Dr. Rosendo Gros 06/05/2018  Procedures:  Renal ultrasound 06/05/2018  Acute abdominal series 06/05/2018  Abdominal film 06/05/2018  CT abdomen and pelvis 06/03/2018, 06/05/2018  Exploratory laparotomy, sigmoid colectomy, colostomy per Drs. Ramirez/Cornett 06/05/2018  Antimicrobials:   IV Zosyn 06/03/2018>>>>   Subjective: Sitting up in chair with family at bedside.  Denies any further nausea or emesis.  Still with abdominal discomfort however states slowly improving.  No flatus.  No stool output in colostomy.  No chest pain.  No shortness of breath.   Objective: Vitals:   06/08/18 0800 06/08/18 0913 06/08/18 1042 06/08/18 1200  BP:   128/74     Pulse:   85   Resp: 15  16 14   Temp:   98.6 F (37 C)   TempSrc:   Oral   SpO2: 96% 97% 96% 96%  Weight:      Height:        Intake/Output Summary (Last 24 hours) at 06/08/2018 1252 Last data filed at 06/08/2018 1018 Gross per 24 hour  Intake 3999.2 ml  Output 920 ml  Net 3079.2 ml   Filed Weights   06/03/18 0830 06/05/18 0648 06/08/18 0500  Weight: 75.3 kg 82.2 kg 82.2 kg    Examination:  General exam: Appears calm and comfortable.  NG tube in place. Respiratory system: Lungs clear to auscultation bilaterally.  No wheezes, no crackles, no rhonchi. Respiratory effort normal. Cardiovascular system: Regular rate rhythm no murmurs rubs or gallops.  No JVD.  No lower extremity edema.  Gastrointestinal system: Abdomen is diffusely tender to palpation, soft, nondistended.  Colostomy intact with minimal bloody drainage.  Central nervous system: Alert and oriented. No focal neurological deficits. Extremities: Symmetric 5 x 5 power. Skin: No rashes, lesions or ulcers Psychiatry: Judgement and insight appear normal. Mood & affect appropriate.     Data Reviewed: I have personally reviewed following labs and imaging studies  CBC: Recent Labs  Lab 06/04/18 0445 06/05/18 0550 06/06/18 0444 06/07/18 0406 06/08/18 0408  WBC 31.6* 32.6* 18.1* 16.7* 17.7*  NEUTROABS 29.0*  --   --  14.3*  --   HGB 12.5 11.2* 12.6 9.5* 9.4*  HCT 36.6 34.2* 37.8 29.5* 28.1*  MCV 90.8 92.2 92.0 92.8 92.1  PLT 334 323 450* 387 244   Basic Metabolic Panel: Recent Labs  Lab 06/04/18 0445 06/05/18 0550 06/06/18 0444 06/07/18 0406 06/08/18 0408  NA 135 133* 135 137 139  K 3.5 3.3* 3.7 3.4* 3.4*  CL 102 105 107 110 112*  CO2 21* 16* 16* 17* 20*  GLUCOSE 123* 108* 168* 116* 117*  BUN 27* 31* 33* 38* 25*  CREATININE 1.42* 1.81* 1.42* 1.48* 0.92  CALCIUM 8.6* 8.5* 8.1* 7.9* 7.8*  MG 1.7  --   --  2.4 2.1  PHOS  --   --   --  2.4* 2.8   GFR: Estimated Creatinine Clearance: 66.3 mL/min (by C-G  formula based on SCr of 0.92 mg/dL). Liver Function Tests: Recent Labs  Lab 06/03/18 0140 06/07/18 0406  AST 20 20  ALT 21 17  ALKPHOS 91 54  BILITOT 1.1 0.7  PROT 6.9 4.7*  ALBUMIN 3.9 1.9*   Recent Labs  Lab 06/03/18 0140  LIPASE 42   No results for input(s): AMMONIA in the last 168 hours. Coagulation Profile: No results for input(s): INR, PROTIME in the last 168 hours. Cardiac Enzymes: No results for input(s): CKTOTAL, CKMB, CKMBINDEX, TROPONINI in the last 168 hours. BNP (last 3 results) No results for input(s): PROBNP in the last 8760 hours. HbA1C: No results for input(s): HGBA1C in the last 72 hours. CBG: Recent Labs  Lab 06/07/18 1725 06/07/18 2356 06/08/18 0616 06/08/18 1234  GLUCAP 104* 109* 124* 104*   Lipid Profile: Recent Labs    06/07/18 0406  TRIG 78   Thyroid Function Tests: No results for input(s): TSH, T4TOTAL, FREET4, T3FREE, THYROIDAB in the last 72 hours. Anemia Panel: Recent Labs    06/07/18 0406  [id number]  652  FOLATE 8.5  FERRITIN 356*  TIBC 147*  IRON 9*   Sepsis Labs: Recent Labs  Lab 06/03/18 0918  LATICACIDVEN 1.4    Recent Results (from the past 240 hour(s))  Culture, blood (routine x 2)     Status: None   Collection Time: 06/03/18  9:20 AM  Result Value Ref Range Status   Specimen Description BLOOD LEFT ANTECUBITAL  Final   Special Requests   Final    BOTTLES DRAWN AEROBIC ONLY Blood Culture adequate volume   Culture   Final    NO GROWTH 5 DAYS Performed at [facility], [place] [address] [place], [place] [address]    Report Status 06/08/2018 FINAL  Final  Culture, blood (routine x 2)     Status: None   Collection Time: 06/03/18  9:30 AM  Result Value Ref Range Status   Specimen Description BLOOD BLOOD LEFT HAND  Final   Special Requests   Final    BOTTLES DRAWN AEROBIC ONLY Blood Culture adequate volume   Culture   Final    NO GROWTH 5 DAYS Performed at [facility], [place] [address],  [place], [place] [address]    Report Status 06/08/2018 FINAL  Final  Surgical pcr screen     Status: None   Collection Time: 06/05/18  4:27 PM  Result Value Ref Range Status   MRSA, PCR NEGATIVE NEGATIVE Final   Staphylococcus aureus NEGATIVE NEGATIVE Final    Comment: (NOTE) The Xpert SA Assay (FDA approved for NASAL specimens in patients 37 years of age and older), is one component of a comprehensive surveillance program. It is not intended to diagnose infection nor to guide or monitor treatment. Performed at [facility], [place] [address], [place], [place] [address]          Radiology Studies: No results found.      Scheduled Meds: . cefoTEtan (CEFOTAN) IV  2 g Intravenous 60 min Pre-Op  . enoxaparin (LOVENOX) injection  40 mg Subcutaneous Daily  . HYDROmorphone   Intravenous Q4H  . insulin aspart  0-9 Units Subcutaneous Q6H  . levothyroxine  68.5 mcg Intravenous Daily  . metoprolol tartrate  5 mg Intravenous Q6H  . mometasone-formoterol  2 puff Inhalation BID  . sodium chloride flush  10-40 mL Intracatheter Q12H   Continuous Infusions: . lactated ringers 1,000 mL with potassium chloride 20 mEq infusion 75 mL/hr at 06/08/18 1010  . lactated ringers 1,000 mL with potassium chloride 20 mEq infusion    . methocarbamol (ROBAXIN) IV Stopped (06/08/18 0559)  . ondansetron (ZOFRAN) IV Stopped (06/08/18 0526)  . piperacillin-tazobactam (ZOSYN)  IV 3.375 g (06/08/18 1211)  . potassium PHOSPHATE IVPB (in mmol)    . TPN ADULT (ION) 30 mL/hr at 06/08/18 1018  . TPN ADULT (ION)       LOS: 4 days    Time spent: 35 minutes    Irine Seal, MD [facility] Pager [phone]  If 7PM-7AM, please contact night-coverage [email or web address] Password [email or web address] 06/08/2018, 12:52 PM

## 2018-06-08 NOTE — Progress Notes (Signed)
PHARMACY - ADULT TOTAL PARENTERAL NUTRITION CONSULT NOTE   Pharmacy Consult:  TPN Indication:  Possible ileus  Patient Measurements: Height: 5\' 5"  (165.1 cm) Weight: 181 lb 3.5 oz (82.2 kg) IBW/kg (Calculated) : 57 TPN AdjBW (KG): 61.6 Body mass index is 30.16 kg/m.  Assessment:  6 YOF admitted on 06/03/18 with severe abdominal pain with N&V for 3 days.  PMH significant for IBS and chronic constipation.  She was found to have sigmoid volvulus with bowel necrosis and underwent ex-lap on 06/05/18.  Patient has been essentially NPO since 05/31/18 and Pharmacy asked to manage TPN.   Per documentation, patient's appetite declined over the past 3 months and has lost 20-30 lbs.  GI: ileus.  BL prealbumin < 5, NG O/P 328mL, drain O/P 363mL.  Scheduled Zofran (last QTc 422ms) Endo: IV Synthroid.  A1c 5.3% - CBGs controlled Insulin requirements in the past 24 hours: 0 unit Lytes: K 3.4 (goal >/= 4 for ileus), high CL and low CO2 - others WNL Renal: SCr down to 0.92, BUN 25, U/S negative - UOP 0.4 ml/kg/hr, LR 20K at 75/hr Pulm: stable on RA - Dulera Cards: VSS - IV metoprolol Hepatobil: LFTs / tbili / TG WNL Neuro: Dilaudid PCA, Robaxin - pain score 0-4 ID: Zosyn (8/7 >> ) for IAI - afebrile, WBC up 17.7, BCx NGTD TPN Access: PICC placed 8/8/1 TPN start date: 06/07/18   Nutritional Goals (RD rec on 8/9): 1800-2000 kCal, 115-130gm protein, ~1.8L per day  Current Nutrition:  TPN   Plan:  Increase TPN to 55 ml/hr (goal rate 80 ml/hr) TPN will provide 83g AA, 198g CHO and 29g ILE for a total of 1296 kCal, meeting ~65% of needs Electrolytes in TPN: increase all with rate increase except Mag, max acetate Daily multivitamin and trace elements in TPN Pepcid 20mg  in daily TPN Continue sensitive SSI Q6H Reduce LR 20K to 50 ml/hr when new TPN bag starts KPhos 15 mmol IV x 1 KCL x 3 runs F/U AM labs   Marlee Armenteros D. Mina Marble, PharmD, BCPS, [place] 06/08/2018, 8:14 AM

## 2018-06-08 NOTE — Progress Notes (Signed)
Patient ID: [name], female   DOB: 05/09/1956, 62 y.o.   MRN: [id number]  In better spirits today. Niece at bedside. Nurse in room.   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 Rosendo Gros on 06/05/18. She will ambulate today. Appreciate surgery's wonderful care of [name]. I will update her primary GI doctor, Dr. Cristina Gong. GI will sign off. Call us back if questions.

## 2018-06-08 NOTE — Evaluation (Signed)
Physical Therapy Evaluation Patient Details Name: [name] MRN: [id number] DOB: 1956/01/22 Today's Date: 06/08/2018   History of Present Illness  Pt is a 62 y/o female admitted secondary to abdominal pain. Pt is s/p EXPLORATORY LAPAROTOMY , sigmoid COLECTOMY, COLOSTOMY. PMH including but not limited to fibromyalgia.  Clinical Impression  Pt presented supine in bed with HOB elevated, awake and willing to participate in therapy session. Prior to admission, pt reported that she was independent with all functional mobility and ADLs. Pt lives with her husband who can provide 24/7 supervision/assistance upon d/c. Pt currently able to perform bed mobility with supervision, transfers with min guard and ambulate in hallway with min guard and use of RW. Pt would continue to benefit from skilled physical therapy services at this time while admitted and after d/c to address the below listed limitations in order to improve overall safety and independence with functional mobility.     Follow Up Recommendations Home health PT;Supervision/Assistance - 24 hour    Equipment Recommendations  Rolling walker with 5" wheels    Recommendations for Other Services       Precautions / Restrictions Precautions Precautions: Fall Precaution Comments: NG tube, colostomy Restrictions Weight Bearing Restrictions: No      Mobility  Bed Mobility Overal bed mobility: Needs Assistance Bed Mobility: Rolling;Sidelying to Sit Rolling: Supervision Sidelying to sit: Min guard       General bed mobility comments: increased time and effort, use of bed rail, min guard to achieve upright sitting  Transfers Overall transfer level: Needs assistance Equipment used: Rolling walker (2 wheeled) Transfers: Sit to/from Stand Sit to Stand: Min guard         General transfer comment: min guard for safety; good technique  Ambulation/Gait Ambulation/Gait assistance: Min guard Gait Distance (Feet): 100 Feet Assistive  device: Rolling walker (2 wheeled) Gait Pattern/deviations: Step-through pattern;Decreased stride length Gait velocity: decreased Gait velocity interpretation: <1.31 ft/sec, indicative of household ambulator General Gait Details: pt with very slow, cautious, guarded gait with RW; steady with no overt LOB or need for physical assistance, min guard for safety  Stairs            Wheelchair Mobility    Modified Rankin (Stroke Patients Only)       Balance Overall balance assessment: Needs assistance Sitting-balance support: Feet supported Sitting balance-Leahy Scale: Good     Standing balance support: During functional activity;Bilateral upper extremity supported Standing balance-Leahy Scale: Poor                               Pertinent Vitals/Pain Pain Assessment: Faces Faces Pain Scale: Hurts whole lot Pain Location: abdomen Pain Descriptors / Indicators: Sore;Grimacing;Guarding Pain Intervention(s): Monitored during session;Repositioned    Home Living Family/patient expects to be discharged to:: Private residence Living Arrangements: Spouse/significant other Available Help at Discharge: Family;Available 24 hours/day Type of Home: House Home Access: Stairs to enter Entrance Stairs-Rails: Right;Left;Can reach both Entrance Stairs-Number of Steps: 5 Home Layout: One level Home Equipment: Shower seat      Prior Function Level of Independence: Independent               Hand Dominance        Extremity/Trunk Assessment   Upper Extremity Assessment Upper Extremity Assessment: Overall WFL for tasks assessed    Lower Extremity Assessment Lower Extremity Assessment: Generalized weakness       Communication   Communication: No difficulties  Cognition Arousal/Alertness:  Awake/alert Behavior During Therapy: WFL for tasks assessed/performed Overall Cognitive Status: Within Functional Limits for tasks assessed                                         General Comments      Exercises     Assessment/Plan    PT Assessment Patient needs continued PT services  PT Problem List Decreased activity tolerance;Decreased balance;Decreased mobility;Decreased coordination;Decreased knowledge of use of DME;Decreased safety awareness;Decreased knowledge of precautions;Pain       PT Treatment Interventions DME instruction;Gait training;Stair training;Functional mobility training;Therapeutic activities;Therapeutic exercise;Neuromuscular re-education;Balance training;Patient/family education    PT Goals (Current goals can be found in the Care Plan section)  Acute Rehab PT Goals Patient Stated Goal: decrease pain PT Goal Formulation: With patient Time For Goal Achievement: 06/22/18 Potential to Achieve Goals: Good    Frequency Min 3X/week   Barriers to discharge        Co-evaluation               AM-PAC PT "6 Clicks" Daily Activity  Outcome Measure Difficulty turning over in bed (including adjusting bedclothes, sheets and blankets)?: Unable Difficulty moving from lying on back to sitting on the side of the bed? : Unable Difficulty sitting down on and standing up from a chair with arms (e.g., wheelchair, bedside commode, etc,.)?: Unable Help needed moving to and from a bed to chair (including a wheelchair)?: None Help needed walking in hospital room?: A Little Help needed climbing 3-5 steps with a railing? : A Little 6 Click Score: 13    End of Session   Activity Tolerance: Patient limited by pain Patient left: in chair;with call bell/phone within reach;with family/visitor present Nurse Communication: Mobility status PT Visit Diagnosis: Other abnormalities of gait and mobility (R26.89);Pain Pain - part of body: (abdomen)    Time: 1111-1140 PT Time Calculation (min) (ACUTE ONLY): 29 min   Charges:   PT Evaluation $PT Eval Moderate Complexity: 1 Mod PT Treatments $Gait Training: 8-22 mins         [place], [place], [place] 06/08/2018, 3:31 PM

## 2018-06-09 LAB — BASIC METABOLIC PANEL
ANION GAP: 8 (ref 5–15)
BUN: 18 mg/dL (ref 8–23)
CALCIUM: 7.9 mg/dL — AB (ref 8.9–10.3)
CO2: 24 mmol/L (ref 22–32)
Chloride: 108 mmol/L (ref 98–111)
Creatinine, Ser: 0.72 mg/dL (ref 0.44–1.00)
GFR calc non Af Amer: >60 mL/min (ref >60)
Glucose, Bld: 129 mg/dL — ABNORMAL HIGH (ref 70–99)
POTASSIUM: 3.7 mmol/L (ref 3.5–5.1)
SODIUM: 140 mmol/L (ref 135–145)

## 2018-06-09 LAB — PHOSPHORUS: Phosphorus: 2.9 mg/dL (ref 2.5–4.6)

## 2018-06-09 LAB — CBC
HCT: 30 % — ABNORMAL LOW (ref 36.0–46.0)
Hemoglobin: 10 g/dL — ABNORMAL LOW (ref 12.0–15.0)
MCH: 30.9 pg (ref 26.0–34.0)
MCHC: 33.3 g/dL (ref 30.0–36.0)
MCV: 92.6 fL (ref 78.0–100.0)
Platelets: 441 10*3/uL — ABNORMAL HIGH (ref 150–400)
RBC: 3.24 MIL/uL — AB (ref 3.87–5.11)
RDW: 13.4 % (ref 11.5–15.5)
WBC: 18.1 10*3/uL — AB (ref 4.0–10.5)

## 2018-06-09 LAB — GLUCOSE, CAPILLARY
GLUCOSE-CAPILLARY: 125 mg/dL — AB (ref 70–99)
GLUCOSE-CAPILLARY: 129 mg/dL — AB (ref 70–99)
Glucose-Capillary: 113 mg/dL — ABNORMAL HIGH (ref 70–99)

## 2018-06-09 LAB — MAGNESIUM: MAGNESIUM: 2 mg/dL (ref 1.7–2.4)

## 2018-06-09 MED ORDER — POTASSIUM CHLORIDE 10 MEQ/50ML IV SOLN
10.0000 meq | INTRAVENOUS | Status: AC
  Administered 2018-06-09 (×4): 10 meq via INTRAVENOUS
  Filled 2018-06-09 (×4): qty 50

## 2018-06-09 MED ORDER — POTASSIUM CHLORIDE 2 MEQ/ML IV SOLN
INTRAVENOUS | Status: DC
  Administered 2018-06-10: 05:00:00 via INTRAVENOUS
  Filled 2018-06-09: qty 1000

## 2018-06-09 MED ORDER — DEXTROSE 5 % IV SOLN
15.0000 mmol | Freq: Once | INTRAVENOUS | Status: AC
  Administered 2018-06-09: 15 mmol via INTRAVENOUS
  Filled 2018-06-09: qty 5

## 2018-06-09 MED ORDER — TRAVASOL 10 % IV SOLN
INTRAVENOUS | Status: AC
  Administered 2018-06-09: 17:00:00 via INTRAVENOUS
  Filled 2018-06-09: qty 1209.6

## 2018-06-09 NOTE — Progress Notes (Signed)
4 Days Post-Op  Subjective: Pleasant and alert.  Feels about the same as yesterday.  Still sore.  No stool or flatus from ostomy yet. Getting out of bed.  Voiding without difficulty. Afebrile.  Heart rate 83.  SPO2 96%. Drain output serosanguineous, 25 cc.  NG output low, 350 cc.  WBC still 18,000.  Hemoglobin 10.  Potassium 3.7.  Creatinine 0.72. TNA to increase to 80 cc/h today  Pathology benign colorectal mucosa.  Perforation.  Objective: Vital signs in last 24 hours: Temp:  [98.6 F (37 C)-99.9 F (37.7 C)] 98.9 F (37.2 C) (08/11 0405) Pulse Rate:  [80-89] 83 (08/11 0405) Resp:  [14-18] 17 (08/11 0805) BP: (128-168)/(70-89) 166/89 (08/11 0405) SpO2:  [96 %-100 %] 96 % (08/11 0856) Last BM Date: 06/04/18(from rectum)  Intake/Output from previous day: 08/10 0701 - 08/11 0700 In: 2980.2 [I.V.:2261.6; IV Piggyback:718.6] Out: 375 [Emesis/NG output:350; Drains:25] Intake/Output this shift: No intake/output data recorded.   General appearance: Pleasant and oriented.  Minimal distress. Resp: clear to auscultation bilaterally GI: Soft.  Obese.  Minimal tenderness.  Midline wound with some exudate.  Fascia intact.  And packed open.  Ostomy pink.  No stool or flatus in bag.  JP serosanguineous   Lab Results:  Results for orders placed or performed during the hospital encounter of 06/03/18 (from the past 24 hour(s))  Glucose, capillary     Status: Abnormal   Collection Time: 06/08/18 12:34 PM  Result Value Ref Range   Glucose-Capillary 104 (H) 70 - 99 mg/dL  Glucose, capillary     Status: Abnormal   Collection Time: 06/08/18  5:40 PM  Result Value Ref Range   Glucose-Capillary 115 (H) 70 - 99 mg/dL  Glucose, capillary     Status: Abnormal   Collection Time: 06/08/18 11:35 PM  Result Value Ref Range   Glucose-Capillary 109 (H) 70 - 99 mg/dL  CBC     Status: Abnormal   Collection Time: 06/09/18  4:31 AM  Result Value Ref Range   WBC 18.1 (H) 4.0 - 10.5 K/uL   RBC 3.24  (L) 3.87 - 5.11 MIL/uL   Hemoglobin 10.0 (L) 12.0 - 15.0 g/dL   HCT 30.0 (L) 36.0 - 46.0 %   MCV 92.6 78.0 - 100.0 fL   MCH 30.9 26.0 - 34.0 pg   MCHC 33.3 30.0 - 36.0 g/dL   RDW 13.4 11.5 - 15.5 %   Platelets 441 (H) 150 - 400 K/uL  Basic metabolic panel     Status: Abnormal   Collection Time: 06/09/18  4:31 AM  Result Value Ref Range   Sodium 140 135 - 145 mmol/L   Potassium 3.7 3.5 - 5.1 mmol/L   Chloride 108 98 - 111 mmol/L   CO2 24 22 - 32 mmol/L   Glucose, Bld 129 (H) 70 - 99 mg/dL   BUN 18 8 - 23 mg/dL   Creatinine, Ser 0.72 0.44 - 1.00 mg/dL   Calcium 7.9 (L) 8.9 - 10.3 mg/dL   GFR calc non Af Amer >60 >60 mL/min   GFR calc Af Amer >60 >60 mL/min   Anion gap 8 5 - 15  Magnesium     Status: None   Collection Time: 06/09/18  4:31 AM  Result Value Ref Range   Magnesium 2.0 1.7 - 2.4 mg/dL  Phosphorus     Status: None   Collection Time: 06/09/18  4:31 AM  Result Value Ref Range   Phosphorus 2.9 2.5 - 4.6 mg/dL  Glucose,  capillary     Status: Abnormal   Collection Time: 06/09/18  5:54 AM  Result Value Ref Range   Glucose-Capillary 125 (H) 70 - 99 mg/dL     Studies/Results: No results found.  . cefoTEtan (CEFOTAN) IV  2 g Intravenous 60 min Pre-Op  . enoxaparin (LOVENOX) injection  40 mg Subcutaneous Daily  . HYDROmorphone   Intravenous Q4H  . insulin aspart  0-9 Units Subcutaneous Q6H  . levothyroxine  68.5 mcg Intravenous Daily  . metoprolol tartrate  5 mg Intravenous Q6H  . mometasone-formoterol  2 puff Inhalation BID  . 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 Rosendo Gros - POD#4 - Afebrile, VSS, leukocytosis  persists. -High risk for  abscess and ileus. -Continue Zosyn - Continue NPO, IVF, NG tube to LIWS and await further bowel function   - BID wet-to-dry dressing changes to midline wound  - PCA for pain, add IV tylenol and robaxin  - PT eval, hopefully ambulate today  -On TNA  Hypokalemia-IVs adjusted a bit.  Correcting  FEN: NPO, IVF, NG tube to LIWS  ID: Zosyn 8/8 >> VTE: SCD's, Lovenox  Foley:DC 8/9 - voiding  @PROBHOSP @  LOS: 5 days    [name] 06/09/2018  .

## 2018-06-09 NOTE — Progress Notes (Signed)
Pt educated on use of pca pump. Has not used pain medicine since midnight per daughter but c/o 22/10 paain. Encouraged to utilize pain meds

## 2018-06-09 NOTE — Progress Notes (Signed)
PHARMACY - ADULT TOTAL PARENTERAL NUTRITION CONSULT NOTE   Pharmacy Consult:  TPN Indication:  Possible ileus  Patient Measurements: Height: 5\' 5"  (165.1 cm) Weight: 181 lb 3.5 oz (82.2 kg) IBW/kg (Calculated) : 57 TPN AdjBW (KG): 61.6 Body mass index is 30.16 kg/m.  Assessment:  56 YOF admitted on 06/03/18 with severe abdominal pain with N&V for 3 days.  PMH significant for IBS and chronic constipation.  She was found to have sigmoid volvulus with bowel necrosis and underwent ex-lap on 06/05/18.  Patient has been essentially NPO since 05/31/18 and Pharmacy asked to manage TPN.   Per documentation, patient's appetite declined over the past 3 months and has lost 20-30 lbs.  GI: ileus.  BL prealbumin < 5, NG O/P 379mL, drain O/P 19mL.  Scheduled Zofran (last QTc 469ms) Endo: IV Synthroid.  A1c 5.3% - CBGs controlled Insulin requirements in the past 24 hours: 0 unit Lytes: K 3.7 (goal >/= 4 for ileus, 4 runs ordered), others WNL Renal: SCr down to 0.92, BUN 25, U/S negative - LR 20K at 50/hr Pulm: stable on RA - Dulera Cards: BP elevated, HR 80s - IV metoprolol Hepatobil: LFTs / tbili / TG WNL Neuro: Dilaudid PCA, Robaxin - pain score 0-4 ID: Zosyn (8/7 >> ) for IAI - afebrile, WBC up 18.1, BCx NGTD TPN Access: PICC placed 8/8/1 TPN start date: 06/07/18   Nutritional Goals (RD rec on 8/9): 1800-2000 kCal, 115-130gm protein, ~1.8L per day  Current Nutrition:  TPN   Plan:  Increase TPN to goal rate 80 ml/hr TPN will provide 121g AA, 288g CHO and 42g ILE for a total of 1886 kCal, meeting 100% of needs Electrolytes in TPN: increase all with rate increase, max acetate Daily multivitamin and trace elements in TPN Pepcid 20mg  in daily TPN Continue sensitive SSI Q6H Reduce LR 20K to 25 ml/hr when new TPN bag starts KPhos 15 mmol IV x 1 F/U AM labs   Veronda Gabor D. Mina Marble, PharmD, BCPS, [place] 06/09/2018, 9:12 AM

## 2018-06-09 NOTE — Progress Notes (Signed)
PROGRESS NOTE    [name]  HEN:[id number] DOB: 10/15/1956 DOA: 06/03/2018 PCP: Kathyrn Lass, MD    Brief Narrative:  [name] is an 62 y.o. female with a Past Medical History of uterine CA; RLS; PAT; hypothyroidism; fibromyalgia/chronic fatigue (not on chronic opiates); and leukocytoclastic vasculitis who presents with severe abdominal pain x 2 days with n/v. Patient has a h/o constipation and takes Linzess prn. Her last BM was Wednesday. She was found to have a stercoral ulcer with surrounding colitis and moderate stool retention.  Did not respond to enemas and on the AM of 8/7 patient's conditioned worsened with peritonitis.  KUB showed air fluid levels, CT scan showed volvulus and patient was taken for an ex lap and eventual sigmoid colectomy/colostomy.  Recovery is expected to be long with anticipated ileus and possible abdominal abscess.     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 -Patient with some complaints of abdominal tightness. -Wound care following. -Patient status post PICC line and on TPN.  Continue empiric IV Zosyn. -General surgery following and managing. -course/hospitalization most likely will be complicated with possible ileus/abdominal abscess  -PCA per general surgery  Leukocytosis -Questionable etiology.  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  Leukocytosis fluctuating.  Patient currently afebrile.  Blood cultures with no growth to date.  Urinalysis unremarkable.  Continue  empiric IV Zosyn.  AKI -Likely secondary to prerenal azotemia.  Improved with hydration.  Follow.   Hypothyroidism: Continue IV Synthroid.  Outpatient follow-up.  History of vasculitis of the skin,followed by dermatology. The patient is on methotrexate ,leucovorin weekly, plaquenil -Patient currently n.p.o.  Could resume oral medications once tolerating oral intake.   History of cystitis, on [place] Likely resume p.o. medications when bowels are moving and tolerating oral intake.   Anxiety and Depression Continue to hold oral medications.   Restless leg syndrome Patient currently n.p.o.  Continue to hold oral meds until patient on a diet.    Hypertension Increase IV Lopressor to 7.5 mg IV every 6 hours.  Once tolerating a diet could transition back to home dose oral Toprol-XL.   Hyperlipidemia Continue to hold oral meds until patient has been started on a diet and tolerating oral intake.   Elevated Glucose  HgbA1c <6  Hypokalemia -Replete potassium per pharmacy to keep potassium greater than 4 due to ileus.  Keep magnesium greater than 2.  Follow.     DVT prophylaxis: Lovenox Code Status: DNR Family Communication: Updated patient and family at bedside. Disposition Plan: To be determined.  Per general surgery.   Consultants:   Gastroenterology: Dr. Michail Sermon 06/03/2018  General surgery: Dr. Rosendo Gros 06/05/2018  Procedures:  Renal ultrasound 06/05/2018  Acute abdominal series 06/05/2018  Abdominal film 06/05/2018  CT abdomen and pelvis 06/03/2018, 06/05/2018  Exploratory laparotomy, sigmoid colectomy, colostomy per Drs. Ramirez/Cornett 06/05/2018  Antimicrobials:   IV Zosyn 06/03/2018>>>>   Subjective: In bed.  States does not feel too well today.  Complaining of some abdominal tightness and pain.  No nausea or  emesis.  No flatus.  No stool output noted in colostomy.  Denies any chest pain no shortness of breath.  Patient states abdominal discomfort with no  significant change since she was seen by general surgery this morning.    Objective: Vitals:   06/09/18 0451 06/09/18 0805 06/09/18 0856 06/09/18 1200  BP:      Pulse:      Resp: 18 17  18   Temp:      TempSrc:      SpO2: 96% 98% 96% 96%  Weight:      Height:        Intake/Output Summary (Last 24 hours) at 06/09/2018 1244 Last data filed at 06/09/2018 0611 Gross per 24 hour  Intake 2075.53 ml  Output 265 ml  Net 1810.53 ml   Filed Weights   06/03/18 0830 06/05/18 0648 06/08/18 0500  Weight: 75.3 kg 82.2 kg 82.2 kg    Examination:  General exam: NAD.  NG tube in place. Respiratory system: CTAB. Respiratory effort normal. Cardiovascular system: RRR no murmurs rubs or gallops.  No JVD.  No lower extremity edema.  Gastrointestinal system: Abdomen is diffusely tender to palpation, soft, nondistended.  Colostomy intact with minimal bloody drainage.  Central nervous system: Alert and oriented. No focal neurological deficits. Extremities: Symmetric 5 x 5 power. Skin: No rashes, lesions or ulcers Psychiatry: Judgement and insight appear normal. Mood & affect appropriate.     Data Reviewed: I have personally reviewed following labs and imaging studies  CBC: Recent Labs  Lab 06/04/18 0445 06/05/18 0550 06/06/18 0444 06/07/18 0406 06/08/18 0408 06/09/18 0431  WBC 31.6* 32.6* 18.1* 16.7* 17.7* 18.1*  NEUTROABS 29.0*  --   --  14.3*  --   --   HGB 12.5 11.2* 12.6 9.5* 9.4* 10.0*  HCT 36.6 34.2* 37.8 29.5* 28.1* 30.0*  MCV 90.8 92.2 92.0 92.8 92.1 92.6  PLT 334 323 450* 387 392 053*   Basic Metabolic Panel: Recent Labs  Lab 06/04/18 0445 06/05/18 0550 06/06/18 0444 06/07/18 0406 06/08/18 0408 06/09/18 0431  NA 135 133* 135 137 139 140  K 3.5 3.3* 3.7 3.4* 3.4* 3.7  CL 102 105 107 110 112* 108  CO2 21* 16* 16* 17* 20* 24  GLUCOSE 123* 108* 168* 116* 117* 129*  BUN 27* 31* 33* 38* 25* 18  CREATININE 1.42* 1.81* 1.42* 1.48* 0.92 0.72  CALCIUM 8.6* 8.5* 8.1* 7.9*  7.8* 7.9*  MG 1.7  --   --  2.4 2.1 2.0  PHOS  --   --   --  2.4* 2.8 2.9   GFR: Estimated Creatinine Clearance: 76.2 mL/min (by C-G formula based on SCr of 0.72 mg/dL). Liver Function Tests: Recent Labs  Lab 06/03/18 0140 06/07/18 0406  AST 20 20  ALT 21 17  ALKPHOS 91 54  BILITOT 1.1 0.7  PROT 6.9 4.7*  ALBUMIN 3.9 1.9*   Recent Labs  Lab 06/03/18 0140  LIPASE 42   No results for input(s): AMMONIA in the last 168 hours. Coagulation Profile: No results for input(s): INR, PROTIME in the last 168 hours. Cardiac Enzymes: No results for input(s): CKTOTAL, CKMB, CKMBINDEX, TROPONINI in the last 168 hours. BNP (last 3 results) No results for input(s): PROBNP in the last 8760 hours. HbA1C: No results for input(s): HGBA1C in the last 72 hours. CBG: Recent Labs  Lab 06/08/18 1234 06/08/18 1740 06/08/18 2335 06/09/18 0554 06/09/18 1130  GLUCAP 104* 115* 109* 125* 129*   Lipid Profile: Recent Labs  06/07/18 0406  TRIG 78   Thyroid Function Tests: No results for input(s): TSH, T4TOTAL, FREET4, T3FREE, THYROIDAB in the last 72 hours. Anemia Panel: Recent Labs    06/07/18 0406  VITAMINB12 652  FOLATE 8.5  FERRITIN 356*  TIBC 147*  IRON 9*   Sepsis Labs: Recent Labs  Lab 06/03/18 4665  LATICACIDVEN 1.4    Recent Results (from the past 240 hour(s))  Culture, blood (routine x 2)     Status: None   Collection Time: 06/03/18  9:20 AM  Result Value Ref Range Status   Specimen Description BLOOD LEFT ANTECUBITAL  Final   Special Requests   Final    BOTTLES DRAWN AEROBIC ONLY Blood Culture adequate volume   Culture   Final    NO GROWTH 5 DAYS Performed at [facility], [place] [address] [place], [place] [address]    Report Status 06/08/2018 FINAL  Final  Culture, blood (routine x 2)     Status: None   Collection Time: 06/03/18  9:30 AM  Result Value Ref Range Status   Specimen Description BLOOD BLOOD LEFT HAND  Final   Special Requests   Final     BOTTLES DRAWN AEROBIC ONLY Blood Culture adequate volume   Culture   Final    NO GROWTH 5 DAYS Performed at [facility], [place] [address] [place], [place] [address]    Report Status 06/08/2018 FINAL  Final  Surgical pcr screen     Status: None   Collection Time: 06/05/18  4:27 PM  Result Value Ref Range Status   MRSA, PCR NEGATIVE NEGATIVE Final   Staphylococcus aureus NEGATIVE NEGATIVE Final    Comment: (NOTE) The Xpert SA Assay (FDA approved for NASAL specimens in patients 61 years of age and older), is one component of a comprehensive surveillance program. It is not intended to diagnose infection nor to guide or monitor treatment. Performed at [facility], [place] [address], [place], [place] [address]          Radiology Studies: No results found.      Scheduled Meds: . cefoTEtan (CEFOTAN) IV  2 g Intravenous 60 min Pre-Op  . enoxaparin (LOVENOX) injection  40 mg Subcutaneous Daily  . HYDROmorphone   Intravenous Q4H  . insulin aspart  0-9 Units Subcutaneous Q6H  . levothyroxine  68.5 mcg Intravenous Daily  . metoprolol tartrate  5 mg Intravenous Q6H  . mometasone-formoterol  2 puff Inhalation BID  . sodium chloride flush  10-40 mL Intracatheter Q12H   Continuous Infusions: . lactated ringers 1,000 mL with potassium chloride 20 mEq infusion 50 mL/hr at 06/08/18 1801  . lactated ringers 1,000 mL with potassium chloride 20 mEq infusion    . methocarbamol (ROBAXIN) IV 500 mg (06/09/18 0556)  . ondansetron (ZOFRAN) IV 8 mg (06/09/18 0530)  . piperacillin-tazobactam (ZOSYN)  IV 3.375 g (06/09/18 1133)  . potassium chloride 10 mEq (06/09/18 1128)  . potassium PHOSPHATE IVPB (in mmol) 15 mmol (06/09/18 1009)  . TPN ADULT (ION) 55 mL/hr at 06/09/18 0611  . TPN ADULT (ION)       LOS: 5 days    Time spent: 35 minutes    Irine Seal, MD [facility] Pager [phone]  If 7PM-7AM, please contact night-coverage [email or web address] Password  [facility] 06/09/2018, 12:44 PM

## 2018-06-10 ENCOUNTER — Inpatient Hospital Stay (HOSPITAL_COMMUNITY): Payer: Medicare Other

## 2018-06-10 LAB — COMPREHENSIVE METABOLIC PANEL
ALT: 18 U/L (ref 0–44)
ANION GAP: 7 (ref 5–15)
AST: 25 U/L (ref 15–41)
Albumin: 1.9 g/dL — ABNORMAL LOW (ref 3.5–5.0)
Alkaline Phosphatase: 92 U/L (ref 38–126)
BUN: 16 mg/dL (ref 8–23)
CALCIUM: 7.7 mg/dL — AB (ref 8.9–10.3)
CO2: 27 mmol/L (ref 22–32)
Chloride: 104 mmol/L (ref 98–111)
Creatinine, Ser: 0.67 mg/dL (ref 0.44–1.00)
GFR calc non Af Amer: >60 mL/min (ref >60)
Glucose, Bld: 121 mg/dL — ABNORMAL HIGH (ref 70–99)
Potassium: 4 mmol/L (ref 3.5–5.1)
SODIUM: 138 mmol/L (ref 135–145)
Total Bilirubin: 0.5 mg/dL (ref 0.3–1.2)
Total Protein: 5.1 g/dL — ABNORMAL LOW (ref 6.5–8.1)

## 2018-06-10 LAB — CBC
HEMATOCRIT: 31 % — AB (ref 36.0–46.0)
Hemoglobin: 9.9 g/dL — ABNORMAL LOW (ref 12.0–15.0)
MCH: 30.3 pg (ref 26.0–34.0)
MCHC: 31.9 g/dL (ref 30.0–36.0)
MCV: 94.8 fL (ref 78.0–100.0)
Platelets: 449 10*3/uL — ABNORMAL HIGH (ref 150–400)
RBC: 3.27 MIL/uL — ABNORMAL LOW (ref 3.87–5.11)
RDW: 13.6 % (ref 11.5–15.5)
WBC: 16.5 10*3/uL — AB (ref 4.0–10.5)

## 2018-06-10 LAB — GLUCOSE, CAPILLARY
GLUCOSE-CAPILLARY: 103 mg/dL — AB (ref 70–99)
GLUCOSE-CAPILLARY: 126 mg/dL — AB (ref 70–99)
GLUCOSE-CAPILLARY: 136 mg/dL — AB (ref 70–99)
Glucose-Capillary: 111 mg/dL — ABNORMAL HIGH (ref 70–99)

## 2018-06-10 LAB — DIFFERENTIAL
BASOS ABS: 0 10*3/uL (ref 0.0–0.1)
Basophils Relative: 0 %
EOS PCT: 3 %
Eosinophils Absolute: 0.5 10*3/uL (ref 0.0–0.7)
Lymphocytes Relative: 8 %
Lymphs Abs: 1.3 10*3/uL (ref 0.7–4.0)
MONO ABS: 2.1 10*3/uL — AB (ref 0.1–1.0)
Monocytes Relative: 13 %
Neutro Abs: 12.6 10*3/uL — ABNORMAL HIGH (ref 1.7–7.7)
Neutrophils Relative %: 76 %

## 2018-06-10 LAB — PHOSPHORUS: PHOSPHORUS: 3.1 mg/dL (ref 2.5–4.6)

## 2018-06-10 LAB — PREALBUMIN: PREALBUMIN: 7.2 mg/dL — AB (ref 18–38)

## 2018-06-10 LAB — TRIGLYCERIDES: TRIGLYCERIDES: 195 mg/dL — AB (ref <150)

## 2018-06-10 LAB — MAGNESIUM: Magnesium: 1.7 mg/dL (ref 1.7–2.4)

## 2018-06-10 MED ORDER — METOPROLOL TARTRATE 5 MG/5ML IV SOLN
7.5000 mg | Freq: Four times a day (QID) | INTRAVENOUS | Status: DC
  Administered 2018-06-10 – 2018-06-13 (×12): 7.5 mg via INTRAVENOUS
  Filled 2018-06-10 (×13): qty 10

## 2018-06-10 MED ORDER — TRAVASOL 10 % IV SOLN
INTRAVENOUS | Status: AC
  Administered 2018-06-10: 18:00:00 via INTRAVENOUS
  Filled 2018-06-10: qty 1209.6

## 2018-06-10 MED ORDER — ALUM & MAG HYDROXIDE-SIMETH 200-200-20 MG/5ML PO SUSP
15.0000 mL | Freq: Four times a day (QID) | ORAL | Status: DC | PRN
  Administered 2018-06-10: 15 mL via ORAL
  Filled 2018-06-10: qty 30

## 2018-06-10 MED ORDER — MAGNESIUM SULFATE 4 GM/100ML IV SOLN
4.0000 g | Freq: Once | INTRAVENOUS | Status: AC
  Administered 2018-06-10: 4 g via INTRAVENOUS
  Filled 2018-06-10: qty 100

## 2018-06-10 MED ORDER — FUROSEMIDE 10 MG/ML IJ SOLN
40.0000 mg | Freq: Two times a day (BID) | INTRAMUSCULAR | Status: AC
  Administered 2018-06-10 – 2018-06-12 (×4): 40 mg via INTRAVENOUS
  Filled 2018-06-10 (×4): qty 4

## 2018-06-10 MED ORDER — HYDROMORPHONE HCL 1 MG/ML IJ SOLN
0.5000 mg | INTRAMUSCULAR | Status: DC | PRN
  Administered 2018-06-10 – 2018-06-13 (×13): 1 mg via INTRAVENOUS
  Administered 2018-06-13: 0.5 mg via INTRAVENOUS
  Filled 2018-06-10 (×14): qty 1

## 2018-06-10 NOTE — Progress Notes (Signed)
Physical Therapy Treatment Patient Details Name: [name] MRN: [id number] DOB: 19-Aug-1956 Today's Date: 06/10/2018    History of Present Illness Pt is a 62 y/o female admitted secondary to abdominal pain. Pt is s/p EXPLORATORY LAPAROTOMY , sigmoid COLECTOMY, COLOSTOMY. PMH including but not limited to fibromyalgia.    PT Comments    Pt progressing with ambulation tolerance, was able to walk with husband this morning and then ambulate 125' with RW and perform exercises with PT. Would like her to ambulate once more time today. Very fatigued after session and reported that she did not sleep last night. PT will continue to follow.    Follow Up Recommendations  Home health PT;Supervision/Assistance - 24 hour     Equipment Recommendations  Rolling walker with 5" wheels    Recommendations for Other Services       Precautions / Restrictions Precautions Precautions: Fall Precaution Comments: NG tube, colostomy, JP Restrictions Weight Bearing Restrictions: No    Mobility  Bed Mobility Overal bed mobility: Needs Assistance Bed Mobility: Rolling;Sidelying to Sit;Sit to Sidelying Rolling: Supervision Sidelying to sit: Min assist     Sit to sidelying: Mod assist General bed mobility comments: mod A to LE's for return to SL from sitting, increased abdominal pain with bed mobility  Transfers Overall transfer level: Needs assistance Equipment used: Rolling walker (2 wheeled) Transfers: Sit to/from Stand Sit to Stand: Min guard         General transfer comment: vc's for hand position  Ambulation/Gait Ambulation/Gait assistance: Min guard Gait Distance (Feet): 125 Feet Assistive device: Rolling walker (2 wheeled) Gait Pattern/deviations: Step-through pattern;Decreased stride length Gait velocity: decreased Gait velocity interpretation: <1.31 ft/sec, indicative of household ambulator General Gait Details: pt became slightly unsteady with increased distance as she fatigued,  min A given at that point   [employer] Mobility    Modified Rankin (Stroke Patients Only)       Balance Overall balance assessment: Needs assistance Sitting-balance support: Feet supported Sitting balance-Leahy Scale: Good     Standing balance support: During functional activity;Bilateral upper extremity supported Standing balance-Leahy Scale: Poor                              Cognition Arousal/Alertness: Awake/alert Behavior During Therapy: WFL for tasks assessed/performed Overall Cognitive Status: Within Functional Limits for tasks assessed                                        Exercises Total Joint Exercises Bridges: Other (comment);5 reps(just initiation) General Exercises - Lower Extremity Ankle Circles/Pumps: AROM;Both;10 reps;Supine Heel Slides: AROM;Both;5 reps;Supine Straight Leg Raises: AROM;Supine;5 reps Hip Flexion/Marching: AROM;Both;5 reps;Standing Heel Raises: AROM;Both;5 reps;Standing Mini-Sqauts: AROM;5 reps;Standing    General Comments        Pertinent Vitals/Pain Pain Assessment: Faces Faces Pain Scale: Hurts whole lot Pain Location: abdomen Pain Descriptors / Indicators: Sore;Grimacing;Guarding Pain Intervention(s): Limited activity within patient's tolerance;Monitored during session    Home Living                      Prior Function            PT Goals (current goals can now be found in the care plan section) Acute Rehab PT Goals Patient Stated Goal: decrease pain PT  Goal Formulation: With patient Time For Goal Achievement: 06/22/18 Potential to Achieve Goals: Good Progress towards PT goals: Progressing toward goals    Frequency    Min 3X/week      PT Plan Current plan remains appropriate    Co-evaluation              AM-PAC PT "6 Clicks" Daily Activity  Outcome Measure  Difficulty turning over in bed (including adjusting bedclothes, sheets and  blankets)?: Unable Difficulty moving from lying on back to sitting on the side of the bed? : Unable Difficulty sitting down on and standing up from a chair with arms (e.g., wheelchair, bedside commode, etc,.)?: Unable Help needed moving to and from a bed to chair (including a wheelchair)?: None Help needed walking in hospital room?: A Little Help needed climbing 3-5 steps with a railing? : A Little 6 Click Score: 13    End of Session Equipment Utilized During Treatment: Gait belt Activity Tolerance: Patient limited by fatigue Patient left: with call bell/phone within reach;with family/visitor present;in bed Nurse Communication: Mobility status PT Visit Diagnosis: Other abnormalities of gait and mobility (R26.89);Pain Pain - part of body: (abdomen)     Time: 6606-0045 PT Time Calculation (min) (ACUTE ONLY): 31 min  Charges:  $Gait Training: 8-22 mins $Therapeutic Exercise: 8-22 mins                     Leighton Roach, PT  Acute Rehab Services  [place] 06/10/2018, 2:09 PM

## 2018-06-10 NOTE — Progress Notes (Signed)
PROGRESS NOTE    [name]  JYN:[id number] DOB: February 16, 1956 DOA: 06/03/2018 PCP: Kathyrn Lass, MD    Brief Narrative:  [name] is an 62 y.o. female with a Past Medical History of uterine CA; RLS; PAT; hypothyroidism; fibromyalgia/chronic fatigue (not on chronic opiates); and leukocytoclastic vasculitis who presents with severe abdominal pain x 2 days with n/v. Patient has a h/o constipation and takes Linzess prn. Her last BM was Wednesday. She was found to have a stercoral ulcer with surrounding colitis and moderate stool retention.  Did not respond to enemas and on the AM of 8/7 patient's conditioned worsened with peritonitis.  KUB showed air fluid levels, CT scan showed volvulus and patient was taken for an ex lap and eventual sigmoid colectomy/colostomy.  Recovery is expected to be long with anticipated ileus and possible abdominal abscess.     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 -Patient with some complaints of abdominal tightness. -Wound care following. -Patient status post PICC line and on TPN.  Continue empiric IV Zosyn. -General surgery following and managing. -course/hospitalization most likely will be complicated with possible ileus/abdominal abscess  -PCA has been changed to IV pain medication boluses per general surgery.   -Per Surgery.   Leukocytosis -Questionable etiology.  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  Leukocytosis fluctuating and slowly trending down.  Patient currently  afebrile.  Blood cultures with no growth to date.  Urinalysis unremarkable.  Continue empiric IV Zosyn.  AKI -Likely secondary to prerenal azotemia.  Improved with hydration.  Follow.   Hypothyroidism: IV Synthroid.  Outpatient follow-up.  History of vasculitis of the skin,followed by dermatology. The patient is on methotrexate ,leucovorin weekly, plaquenil -Patient currently n.p.o.  Could resume oral medications once tolerating oral intake.   History of cystitis, on Elmiron Likely resume p.o. medications when bowels are moving and tolerating oral intake.   Anxiety and Depression Continue to hold oral medications.   Restless leg syndrome Oral medications on hold as patient currently n.p.o.  Resume when on a diet.    Hypertension Increase IV Lopressor to 7.5 mg IV every 6 hours.  Once tolerating a diet could transition back to home dose oral Toprol-XL.   Hyperlipidemia Oral medications on hold as patient currently n.p.o.  Will resume once tolerating oral intake.    Elevated Glucose  HgbA1c <6  Hypokalemia -Replete potassium per pharmacy to keep potassium greater than 4 due to ileus.  Keep magnesium greater than 2.  IV magnesium 4 g x 1.  Follow.     DVT prophylaxis: Lovenox Code Status: DNR Family Communication: Updated patient and family at bedside. Disposition Plan: To be determined.  Per general surgery.   Consultants:   Gastroenterology: Dr. Michail Sermon 06/03/2018  General surgery: Dr. Rosendo Gros 06/05/2018  Procedures:  Renal ultrasound 06/05/2018  Acute abdominal series 06/05/2018  Abdominal film 06/05/2018  CT abdomen and pelvis 06/03/2018, 06/05/2018  Exploratory laparotomy, sigmoid colectomy, colostomy per Drs. Ramirez/Cornett 06/05/2018  Antimicrobials:   IV Zosyn 06/03/2018>>>>   Subjective:  Sitting up in bed working with PT.  Complaining of swelling in the lower extremities.  Still with some abdominal discomfort and tightness with no significant change  since yesterday.  Colostomy with no output.  Denies any chest pain or shortness of breath.    Objective: Vitals:   06/10/18 0405 06/10/18 0444 06/10/18 0700 06/10/18 1015  BP: (!) 157/74     Pulse: 86     Resp: 18   12  Temp: 98.7 F (37.1 C)     TempSrc: Oral     SpO2: 98%  96% 97%  Weight:  91.9 kg    Height:        Intake/Output Summary (Last 24 hours) at 06/10/2018 1320 Last data filed at 06/10/2018 0800 Gross per 24 hour  Intake 2450.69 ml  Output 935 ml  Net 1515.69 ml   Filed Weights   06/05/18 0648 06/08/18 0500 06/10/18 0444  Weight: 82.2 kg 82.2 kg 91.9 kg    Examination:  General exam: NG tube in place. Respiratory system: Lungs clear to auscultation bilaterally.  No wheezes, no crackles, no rhonchi. Respiratory effort normal. Cardiovascular system: Regular rate and rhythm no murmurs rubs or gallops. No JVD. 2 + BLE edema.  Gastrointestinal system: Abdomen is some diffuse tenderness to palpation, soft, non-distended. Colostomy intact with minimal bloody drainage.  Central nervous system: Alert and oriented. No focal neurological deficits. Extremities: 2 + BLE edemaSymmetric 5 x 5 power. Skin: No rashes, lesions or ulcers Psychiatry: Judgement and insight appear normal. Mood & affect appropriate.     Data Reviewed: I have personally reviewed following labs and imaging studies  CBC: Recent Labs  Lab 06/04/18 0445  06/06/18 0444 06/07/18 0406 06/08/18 0408 06/09/18 0431 06/10/18 0425  WBC 31.6*   < > 18.1* 16.7* 17.7* 18.1* 16.5*  NEUTROABS 29.0*  --   --  14.3*  --   --  12.6*  HGB 12.5   < > 12.6 9.5* 9.4* 10.0* 9.9*  HCT 36.6   < > 37.8 29.5* 28.1* 30.0* 31.0*  MCV 90.8   < > 92.0 92.8 92.1 92.6 94.8  PLT 334   < > 450* 387 392 441* 449*   < > = values in this interval not displayed.   Basic Metabolic Panel: Recent Labs  Lab 06/04/18 0445  06/06/18 0444 06/07/18 0406 06/08/18 0408 06/09/18 0431 06/10/18 0425  NA 135   < > 135 137 139 140  138  K 3.5   < > 3.7 3.4* 3.4* 3.7 4.0  CL 102   < > 107 110 112* 108 104  CO2 21*   < > 16* 17* 20* 24 27  GLUCOSE 123*   < > 168* 116* 117* 129* 121*  BUN 27*   < > 33* 38* 25* 18 16  CREATININE 1.42*   < > 1.42* 1.48* 0.92 0.72 0.67  CALCIUM 8.6*   < > 8.1* 7.9* 7.8* 7.9* 7.7*  MG 1.7  --   --  2.4 2.1 2.0 1.7  PHOS  --   --   --  2.4* 2.8 2.9 3.1   < > = values in this interval not displayed.   GFR: Estimated Creatinine Clearance: 80.7 mL/min (by C-G formula based on SCr of 0.67 mg/dL). Liver Function Tests: Recent Labs  Lab 06/07/18 0406 06/10/18 0425  AST 20 25  ALT 17 18  ALKPHOS 54 92  BILITOT 0.7 0.5  PROT 4.7* 5.1*  ALBUMIN 1.9* 1.9*   No results  for input(s): LIPASE, AMYLASE in the last 168 hours. No results for input(s): AMMONIA in the last 168 hours. Coagulation Profile: No results for input(s): INR, PROTIME in the last 168 hours. Cardiac Enzymes: No results for input(s): CKTOTAL, CKMB, CKMBINDEX, TROPONINI in the last 168 hours. BNP (last 3 results) No results for input(s): PROBNP in the last 8760 hours. HbA1C: No results for input(s): HGBA1C in the last 72 hours. CBG: Recent Labs  Lab 06/09/18 1130 06/09/18 1748 06/10/18 0023 06/10/18 0558 06/10/18 1211  GLUCAP 129* 113* 126* 111* 136*   Lipid Profile: Recent Labs    06/10/18 0425  TRIG 195*   Thyroid Function Tests: No results for input(s): TSH, T4TOTAL, FREET4, T3FREE, THYROIDAB in the last 72 hours. Anemia Panel: No results for input(s): VITAMINB12, FOLATE, FERRITIN, TIBC, IRON, RETICCTPCT in the last 72 hours. Sepsis Labs: No results for input(s): PROCALCITON, LATICACIDVEN in the last 168 hours.  Recent Results (from the past 240 hour(s))  Culture, blood (routine x 2)     Status: None   Collection Time: 06/03/18  9:20 AM  Result Value Ref Range Status   Specimen Description BLOOD LEFT ANTECUBITAL  Final   Special Requests   Final    BOTTLES DRAWN AEROBIC ONLY Blood Culture adequate  volume   Culture   Final    NO GROWTH 5 DAYS Performed at [facility], [address] [place], [place] [address]    Report Status 06/08/2018 FINAL  Final  Culture, blood (routine x 2)     Status: None   Collection Time: 06/03/18  9:30 AM  Result Value Ref Range Status   Specimen Description BLOOD BLOOD LEFT HAND  Final   Special Requests   Final    BOTTLES DRAWN AEROBIC ONLY Blood Culture adequate volume   Culture   Final    NO GROWTH 5 DAYS Performed at [facility], [place] [address] [place], [place] [address]    Report Status 06/08/2018 FINAL  Final  Surgical pcr screen     Status: None   Collection Time: 06/05/18  4:27 PM  Result Value Ref Range Status   MRSA, PCR NEGATIVE NEGATIVE Final   Staphylococcus aureus NEGATIVE NEGATIVE Final    Comment: (NOTE) The Xpert SA Assay (FDA approved for NASAL specimens in patients 34 years of age and older), is one component of a comprehensive surveillance program. It is not intended to diagnose infection nor to guide or monitor treatment. Performed at [facility], [place] [address], [place], [place] [address]          Radiology Studies: Dg Abd Portable 1v  Result Date: 06/10/2018 CLINICAL DATA:  Postoperative abdominal pain and ileus. EXAM: PORTABLE ABDOMEN - 1 VIEW COMPARISON:  CT scan and radiograph of June 05, 2018. FINDINGS: The bowel gas pattern is normal. Surgical drain is seen in the pelvis. Status post cholecystectomy. Distal tip of nasogastric tube seen in proximal stomach. IMPRESSION: No evidence of bowel obstruction or ileus. Surgical drain is noted in the pelvis. Electronically Signed   By: Marijo Conception, M.D.   On: 06/10/2018 13:12        Scheduled Meds: . enoxaparin (LOVENOX) injection  40 mg Subcutaneous Daily  . insulin aspart  0-9 Units Subcutaneous Q6H  . levothyroxine  68.5 mcg Intravenous Daily  . metoprolol tartrate  7.5 mg Intravenous Q6H  . mometasone-formoterol  2 puff Inhalation  BID  . sodium chloride flush  10-40 mL Intracatheter Q12H  Continuous Infusions: . lactated ringers 1,000 mL with potassium chloride 20 mEq infusion 25 mL/hr at 06/10/18 0442  . methocarbamol (ROBAXIN) IV Stopped (06/10/18 5927)  . ondansetron (ZOFRAN) IV Stopped (06/10/18 0546)  . piperacillin-tazobactam (ZOSYN)  IV 3.375 g (06/10/18 1301)  . TPN ADULT (ION) 80 mL/hr at 06/10/18 0636  . TPN ADULT (ION)       LOS: 6 days    Time spent: 40 minutes    Irine Seal, MD [facility] Pager [phone]  If 7PM-7AM, please contact night-coverage [email or web address] Password [email or web address] 06/10/2018, 1:20 PM

## 2018-06-10 NOTE — Care Management Note (Signed)
Case Management Note  Patient Details  Name: [name] MRN: [id number] Date of Birth: Nov 20, 1955  Subjective/Objective:                    Action/Plan:  Discussed discharge planning with patient and husband at bedside. Confirmed face sheet information. Husband Bill [phone] will be with his wife at home 24/7 for at least 2 weeks . Both aware they will be taught dressing changes and ostomy care before discharge and the home health nurse will not visit every day.   Patient has transportation and can get prescriptions filled. Expected Discharge Date:                  Expected Discharge Plan:  [place]  In-House Referral:     Discharge planning Services  CM Consult  Post Acute Care Choice:  Home Health Choice offered to:  Patient, Spouse  DME Arranged:  N/A DME Agency:  NA  HH Arranged:  RN [place] Agency:  [place]  Status of Service:  In process, will continue to follow  If discussed at Long Length of Stay Meetings, dates discussed:    Additional Comments:  Marilu Favre, RN 06/10/2018, 10:59 AM

## 2018-06-10 NOTE — Progress Notes (Addendum)
PHARMACY - ADULT TOTAL PARENTERAL NUTRITION  Pharmacy Consult:  TPN Indication:  Possible ileus  Patient Measurements: Height: 5\' 5"  (165.1 cm) Weight: 202 lb 9.6 oz (91.9 kg) IBW/kg (Calculated) : 57 TPN AdjBW (KG): 61.6 Body mass index is 33.71 kg/m.  Assessment:  79 YOF admitted on 06/03/18 with severe abdominal pain with N&V for 3 days.  PMH significant for IBS and chronic constipation.  She was found to have sigmoid volvulus with bowel necrosis and underwent ex-lap on 06/05/18.  Patient has been essentially NPO since 05/31/18 and Pharmacy asked to manage TPN.  Per documentation, patient's appetite declined over the past 3 months and has lost 20-30 lbs.  GI: ileus.  BL prealbumin 5 > 7.2, NG O/P 357mL, drain O/P 83mL.  Scheduled Zofran (last QTc 442ms) Endo: IV Synthroid.  A1c 5.3% - CBGs controlled Insulin requirements in the past 24 hours: 1 unit Lytes: K 4, Coca 9.3 others WNL Renal: SCr down to 0.6, U/S negative - LR 20K at 25/hr Pulm: stable on RA - Dulera Cards: BP elevated, HR 80s - IV metoprolol Hepatobil: LFTs / tbili / TG WNL Neuro: Dilaudid PCA, Robaxin - pain score 0-4 ID: Zosyn (8/7 >> ) for IAI - afebrile, WBC up 18.1, BCx NGTD TPN Access: PICC placed 8/8/1 TPN start date: 06/07/18   Nutritional Goals (RD rec on 8/9): 1800-2000 kCal, 115-130gm protein, ~1.8L per day  Current Nutrition:  TPN   Plan:  Increase TPN at goal rate 80 ml/hr TPN will provide 121g AA, 288g CHO and 42g ILE for a total of 1886 kCal, meeting 100% of needs Electrolytes in TPN: standard with [id number] 1:1 Daily multivitamin and trace elements in TPN Pepcid 20mg  in daily TPN Continue sensitive SSI Q6H LR 20K at 25 ml/hr Magnesium 4 g per MD     Dalayah Deahl, Jake Church 06/10/2018 9:14 AM

## 2018-06-10 NOTE — Consult Note (Signed)
[place] Nurse ostomy follow up Pouch change and teaching completed at [facility] with spouse Stoma type/location: LLQ colostomy Stomal assessment/size: Oval shaped 1 1/4 inches top to bottom, 1 1/2 inches side to side Peristomal assessment: Tissue is normal color and texture, however, the circumferential peristomal skin margin is pink.  It is not denuded, wet, or weeping, just a narrow, pink "ring" of skin right along the margin.  At the 11 - 12 o'clock position it appears that there may be a developing area of mucocutaneous separation. Treatment options for stomal/peristomal skin: Barrier ring placed Output: thick serosanginous Ostomy pouching: 2pc. Flat barrier. Education provided: Patient's spouse performed the entire emptying, removal, and placement of a new pouching system except that I cut the barrier due to the oval shape.  Also, I explained to the patient and his spouse that tomorrow morning I will come back and we will try one of our convex pouching systems and an ostomy belt.  It appears the patient is well on her way to developing a retracted stoma.  Right now it is basically flat.  It is moist but has darkened areas.   Val Riles, RN, MSN, CWOCN, CNS-BC, pager [phone]

## 2018-06-10 NOTE — Progress Notes (Signed)
Patient ID: [name], female   DOB: December 27, 1955, 62 y.o.   MRN: [id number]    5 Days Post-Op  Subjective: Patient with no new complaints.  Isn't using PCA much at all.  Pain around 6 or 7.  Ambulated once yesterday with a walker.  Objective: Vital signs in last 24 hours: Temp:  [97.7 F (36.5 C)-98.7 F (37.1 C)] 98.7 F (37.1 C) (08/12 0405) Pulse Rate:  [78-87] 86 (08/12 0405) Resp:  [9-19] 18 (08/12 0405) BP: (157-174)/(74-89) 157/74 (08/12 0405) SpO2:  [96 %-99 %] 96 % (08/12 0700) Weight:  [91.9 kg] 91.9 kg (08/12 0444) Last BM Date: 05/29/18  Intake/Output from previous day: 08/11 0701 - 08/12 0700 In: 2450.7 [I.V.:1990.2; IV Piggyback:460.5] Out: 535 [Emesis/NG output:500; Drains:10; Stool:25] Intake/Output this shift: Total I/O In: -  Out: 400 [Emesis/NG output:400]  PE: Abd: soft, appropriately tender, ostomy with no air or essentially output.  Stoma is viable.  Ostomy is leaking.  Wound is packed.  Inferior aspect of wound with some partially necrotic appearing adipose tissue on the right side.  JP drain with minimal serous output.  NGT with about 400cc of bilious output yesterdary  Lab Results:  Recent Labs    06/09/18 0431 06/10/18 0425  WBC 18.1* 16.5*  HGB 10.0* 9.9*  HCT 30.0* 31.0*  PLT 441* 449*   BMET Recent Labs    06/09/18 0431 06/10/18 0425  NA 140 138  K 3.7 4.0  CL 108 104  CO2 24 27  GLUCOSE 129* 121*  BUN 18 16  CREATININE 0.72 0.67  CALCIUM 7.9* 7.7*   PT/INR No results for input(s): LABPROT, INR in the last 72 hours. CMP     Component Value Date/Time   NA 138 06/10/2018 0425   K 4.0 06/10/2018 0425   CL 104 06/10/2018 0425   CO2 27 06/10/2018 0425   GLUCOSE 121 (H) 06/10/2018 0425   BUN 16 06/10/2018 0425   CREATININE 0.67 06/10/2018 0425   CALCIUM 7.7 (L) 06/10/2018 0425   PROT 5.1 (L) 06/10/2018 0425   ALBUMIN 1.9 (L) 06/10/2018 0425   AST 25 06/10/2018 0425   ALT 18 06/10/2018 0425   ALKPHOS 92 06/10/2018 0425   BILITOT 0.5 06/10/2018 0425   GFRNONAA >60 06/10/2018 0425   GFRAA >60 06/10/2018 0425   Lipase     Component Value Date/Time   LIPASE 42 06/03/2018 0140       Studies/Results: No results found.  Anti-infectives: Anti-infectives (From admission, onward)   Start     Dose/Rate Route Frequency Ordered Stop   06/06/18 0600  cefoTEtan (CEFOTAN) 2 g in sodium chloride 0.9 % 100 mL IVPB  Status:  Discontinued     2 g 200 mL/hr over 30 Minutes Intravenous On call to O.R. 06/05/18 1608 06/05/18 1624   06/06/18 0600  cefoTEtan in Dextrose 5% (CEFOTAN) IVPB 2 g  Status:  Discontinued     2 g 100 mL/hr over 30 Minutes Intravenous 60 min pre-op 06/05/18 1625 06/10/18 0918   06/05/18 1930  piperacillin-tazobactam (ZOSYN) IVPB 3.375 g     3.375 g 12.5 mL/hr over 240 Minutes Intravenous Every 8 hours 06/05/18 1904     06/05/18 0900  piperacillin-tazobactam (ZOSYN) IVPB 3.375 g  Status:  Discontinued     3.375 g 12.5 mL/hr over 240 Minutes Intravenous Every 8 hours 06/05/18 0833 06/05/18 1904   06/03/18 1000  hydroxychloroquine (PLAQUENIL) tablet 200 mg  Status:  Discontinued     200 mg Oral  2 times daily 06/03/18 0742 06/05/18 0731   06/03/18 0730  piperacillin-tazobactam (ZOSYN) IVPB 3.375 g     3.375 g 12.5 mL/hr over 240 Minutes Intravenous  Once 06/03/18 0726 06/03/18 1246   06/03/18 0630  ciprofloxacin (CIPRO) IVPB 400 mg  Status:  Discontinued     400 mg 200 mL/hr over 60 Minutes Intravenous  Once 06/03/18 0617 06/03/18 0725   06/03/18 0630  metroNIDAZOLE (FLAGYL) IVPB 500 mg  Status:  Discontinued     500 mg 100 mL/hr over 60 Minutes Intravenous  Once 06/03/18 0617 06/03/18 0725       Assessment/Plan s/p Procedure(s): EXPLORATORY LAPAROTOMY AND PARTIAL COLECTOMY COLOSTOMY  AKI Hypothyroidism Anxiety and depression Restless leg syndrome HLD  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 Rosendo Gros - POD#5 - Afebrile, VSS, leukocytosisimproving -ileus persists. -Continue Zosyn - Continue NPO, IVF, NG tube to LIWS and await further bowel function  -DC PCA as she isn't using it and start just IV pushes prn -PT ordered for mobilization -pulm toilet - BID wet-to-dry dressing changes to midline wound  - IV tylenol and robaxin   PCM -On TNA  Hypokalemia -resolved  FEN: NPO, IVF, NG tube to LIWS  ID: Zosyn 8/8 >> VTE: SCD's, Lovenox  Foley:DC8/9    LOS: 6 days    Henreitta Cea , [facility] 06/10/2018, 10:13 AM Pager: [phone]

## 2018-06-11 DIAGNOSIS — K9189 Other postprocedural complications and disorders of digestive system: Secondary | ICD-10-CM

## 2018-06-11 DIAGNOSIS — E8770 Fluid overload, unspecified: Secondary | ICD-10-CM

## 2018-06-11 DIAGNOSIS — K567 Ileus, unspecified: Secondary | ICD-10-CM

## 2018-06-11 LAB — CBC
HEMATOCRIT: 33.1 % — AB (ref 36.0–46.0)
Hemoglobin: 10.8 g/dL — ABNORMAL LOW (ref 12.0–15.0)
MCH: 30.3 pg (ref 26.0–34.0)
MCHC: 32.6 g/dL (ref 30.0–36.0)
MCV: 93 fL (ref 78.0–100.0)
Platelets: 534 10*3/uL — ABNORMAL HIGH (ref 150–400)
RBC: 3.56 MIL/uL — ABNORMAL LOW (ref 3.87–5.11)
RDW: 13.2 % (ref 11.5–15.5)
WBC: 19 10*3/uL — ABNORMAL HIGH (ref 4.0–10.5)

## 2018-06-11 LAB — GLUCOSE, CAPILLARY
GLUCOSE-CAPILLARY: 101 mg/dL — AB (ref 70–99)
GLUCOSE-CAPILLARY: 102 mg/dL — AB (ref 70–99)
GLUCOSE-CAPILLARY: 131 mg/dL — AB (ref 70–99)
Glucose-Capillary: 126 mg/dL — ABNORMAL HIGH (ref 70–99)

## 2018-06-11 LAB — MAGNESIUM: Magnesium: 1.9 mg/dL (ref 1.7–2.4)

## 2018-06-11 LAB — URINALYSIS, ROUTINE W REFLEX MICROSCOPIC
Bilirubin Urine: NEGATIVE
GLUCOSE, UA: NEGATIVE mg/dL
HGB URINE DIPSTICK: NEGATIVE
KETONES UR: NEGATIVE mg/dL
Leukocytes, UA: NEGATIVE
Nitrite: NEGATIVE
PROTEIN: NEGATIVE mg/dL
Specific Gravity, Urine: 1.016 (ref 1.005–1.030)
pH: 8 (ref 5.0–8.0)

## 2018-06-11 MED ORDER — PANTOPRAZOLE SODIUM 40 MG PO TBEC
40.0000 mg | DELAYED_RELEASE_TABLET | Freq: Every day | ORAL | Status: DC
  Administered 2018-06-11 – 2018-06-15 (×5): 40 mg via ORAL
  Filled 2018-06-11 (×5): qty 1

## 2018-06-11 MED ORDER — GI COCKTAIL ~~LOC~~
30.0000 mL | Freq: Once | ORAL | Status: AC
  Administered 2018-06-11: 30 mL via ORAL
  Filled 2018-06-11: qty 30

## 2018-06-11 MED ORDER — GI COCKTAIL ~~LOC~~
30.0000 mL | Freq: Three times a day (TID) | ORAL | Status: DC | PRN
  Filled 2018-06-11: qty 30

## 2018-06-11 MED ORDER — TRAVASOL 10 % IV SOLN
INTRAVENOUS | Status: DC
  Administered 2018-06-11: 18:00:00 via INTRAVENOUS
  Filled 2018-06-11: qty 1209.6

## 2018-06-11 NOTE — Care Management Important Message (Signed)
Important Message  Patient Details  Name: [name] MRN: [id number] Date of Birth: 09/18/1956   Medicare Important Message Given:  Yes    [name] [name] 06/11/2018, 11:45 AM

## 2018-06-11 NOTE — Consult Note (Signed)
[facility] CM Primary Care Navigator  06/11/2018  [name] October 25, 1956 [id number]   Met with patientand husband Rush Landmark) at the bedside toidentify possible discharge needs.  Patient reports that she had "severe lower abdominal pain and vomiting" thathad ledto thisadmission/ surgery. (Sigmoid volvulus with bowel necrosis and perforation-feculent peritonitis, status post exploratory laparotomy, sigmoid colectomy, colostomy)   Patientendorses Dr. Kath[facility]n Lass [facility].      [place] and [facility] Mail Order service to obtain medications without current difficulty.Husband mentioned that they were not able to afford Lyrica a few months back but did not seek assistance. Patient and husband were made aware to notify primary care provider if patient starts having issues with medications in the future to get assistance. Made them aware of [facility] care management services (with medication issues) if needed in the future.   Patienthas beenmanagingherownmedicationsat homewith use of "pill box" system filled every 2 weeks.  Patientstatesthatshe had been driving prior to admission/ surgery but husband will be able to providetransportation to herdoctors' appointments after discharge.  Patientlives with husband who will serve as the primary caregiver at home. Patient's son Leroy Sea- from [place]) will also assist if needed.  Anticipated plan for dischargeis homewith home health services per therapy recommendation.  Patientand husband voiced understandingto callprimarycare provider's office whenshereturnshome,for a post discharge follow-upvisitwithin1- 2 weeksor sooner if needs arise.Patient letter (with PCP's contact number) was provided asareminder.   Discussed with patient and husband regarding [facility] CM services available for health  managementandresourcesat homebutpatient indicated having no current needs or concerns for now. Patient and husband verbalized understandingof needto seekreferral from primary care provider to [facility] care management ifdeemed necessary and appropriatefor anyservicesin thefuture.  [facility] care management information was provided for futureneeds that she may have.  Patienthowever,verbally agreedand optedforEMMIcalls tofollow-up withherrecoveryat home.   Referral made for [facility] calls after discharge.   For additional questions please contact:  Edwena Felty A. Athony Coppa, BSN, RN-BC [facility] PRIMARY CARE Navigator Cell: [phone]

## 2018-06-11 NOTE — Progress Notes (Signed)
PHARMACY - ADULT TOTAL PARENTERAL NUTRITION  Pharmacy Consult:  TPN Indication:  Possible ileus  Patient Measurements: Height: 5\' 5"  (165.1 cm) Weight: 191 lb 9.3 oz (86.9 kg) IBW/kg (Calculated) : 57 TPN AdjBW (KG): 61.6 Body mass index is 31.88 kg/m.  Assessment:  71 YOF admitted on 06/03/18 with severe abdominal pain with N&V for 3 days.  PMH significant for IBS and chronic constipation.  She was found to have sigmoid volvulus with bowel necrosis and underwent ex-lap on 06/05/18.  Patient has been essentially NPO since 05/31/18. Per documentation, patient's appetite declined over the past 3 months and has lost 20-30 lbs.  GI: ileus.  BL prealbumin 5 > 7.2, NG O/P 326mL, drain O/P 67mL.  Scheduled Zofran (last QTc 438ms) Starting CLD Endo: IV Synthroid.  A1c 5.3% - CBGs controlled Insulin requirements in the past 24 hours: 2 units Lytes: K 4, Coca 9.3 others WNL Renal: SCr down to 0.6, U/S negative - LR 20K at 25/hr Pulm: stable on RA - Dulera Cards: BP elevated, HR 80s - IV metoprolol, Lasix bid Hepatobil: LFTs / tbili / TG WNL Neuro: Dilaudid PCA, Robaxin - pain score 0-4 ID: Zosyn (8/7 >> ) for IAI - afebrile, WBC up 18.1, BCx NGTD TPN Access: PICC placed 8/8/1 TPN start date: 06/07/18   Nutritional Goals (RD rec on 8/9): 1800-2000 kCal, 115-130gm protein, ~1.8L per day  Current Nutrition:  TPN   Plan:  Continue TPN at goal rate 80 ml/hr TPN will provide 121g AA, 288g CHO and 42g ILE for a total of 1886 kCal, meeting 100% of needs Electrolytes in TPN: standard with JT:[id number] 1:1 Daily multivitamin and trace elements in TPN Pepcid 20mg  in daily TPN Continue sensitive SSI Q6H LR 20K at 25 ml/hr F/u diet F/u LOT zosyn      Harvel Quale 06/11/2018 8:46 AM

## 2018-06-11 NOTE — Progress Notes (Signed)
Patient ID: [name], female   DOB: April 12, 1956, 62 y.o.   MRN: [id number]    6 Days Post-Op  Subjective: Pt looks better today and feels somewhat better.  No other complaints.  Moved with PT yesterday and ambulated in the halls.  Passing flatus in ostomy.  Some urgency and pressure with urination.  No cough.  Objective: Vital signs in last 24 hours: Temp:  [98.7 F (37.1 C)-99.1 F (37.3 C)] 98.7 F (37.1 C) (08/13 0541) Pulse Rate:  [76-80] 80 (08/13 0541) Resp:  [12-16] 16 (08/13 0541) BP: (141-158)/(62-76) 141/62 (08/13 0541) SpO2:  [95 %-99 %] 99 % (08/13 0541) Weight:  [86.9 kg] 86.9 kg (08/13 0427) Last BM Date: 05/29/18  Intake/Output from previous day: 08/12 0701 - 08/13 0700 In: 2547.5 [I.V.:2189.3; IV Piggyback:358.1] Out: 4410 [Urine:2850; Emesis/NG output:1550; Drains:10] Intake/Output this shift: No intake/output data recorded.  PE: Abd: soft, appropriately tender, midline wound with some necrotic fat at the base of the wound with still some slightly cloudy drainage from superior part of the wound.  Edges are mostly clean otherwise.  Ostomy with good feculent output today with air in the bag as well.  JP drain with minimal serous drainage. 15cc documented yesterday.  None present today.     Lab Results:  Recent Labs    06/09/18 0431 06/10/18 0425  WBC 18.1* 16.5*  HGB 10.0* 9.9*  HCT 30.0* 31.0*  PLT 441* 449*   BMET Recent Labs    06/09/18 0431 06/10/18 0425  NA 140 138  K 3.7 4.0  CL 108 104  CO2 24 27  GLUCOSE 129* 121*  BUN 18 16  CREATININE 0.72 0.67  CALCIUM 7.9* 7.7*   PT/INR No results for input(s): LABPROT, INR in the last 72 hours. CMP     Component Value Date/Time   NA 138 06/10/2018 0425   K 4.0 06/10/2018 0425   CL 104 06/10/2018 0425   CO2 27 06/10/2018 0425   GLUCOSE 121 (H) 06/10/2018 0425   BUN 16 06/10/2018 0425   CREATININE 0.67 06/10/2018 0425   CALCIUM 7.7 (L) 06/10/2018 0425   PROT 5.1 (L) 06/10/2018 0425   ALBUMIN 1.9 (L) 06/10/2018 0425   AST 25 06/10/2018 0425   ALT 18 06/10/2018 0425   ALKPHOS 92 06/10/2018 0425   BILITOT 0.5 06/10/2018 0425   GFRNONAA >60 06/10/2018 0425   GFRAA >60 06/10/2018 0425   Lipase     Component Value Date/Time   LIPASE 42 06/03/2018 0140       Studies/Results: Dg Abd Portable 1v  Result Date: 06/10/2018 CLINICAL DATA:  Postoperative abdominal pain and ileus. EXAM: PORTABLE ABDOMEN - 1 VIEW COMPARISON:  CT scan and radiograph of June 05, 2018. FINDINGS: The bowel gas pattern is normal. Surgical drain is seen in the pelvis. Status post cholecystectomy. Distal tip of nasogastric tube seen in proximal stomach. IMPRESSION: No evidence of bowel obstruction or ileus. Surgical drain is noted in the pelvis. Electronically Signed   By: Marijo Conception, M.D.   On: 06/10/2018 13:12    Anti-infectives: Anti-infectives (From admission, onward)   Start     Dose/Rate Route Frequency Ordered Stop   06/06/18 0600  cefoTEtan (CEFOTAN) 2 g in sodium chloride 0.9 % 100 mL IVPB  Status:  Discontinued     2 g 200 mL/hr over 30 Minutes Intravenous On call to O.R. 06/05/18 1608 06/05/18 1624   06/06/18 0600  cefoTEtan in Dextrose 5% (CEFOTAN) IVPB 2 g  Status:  Discontinued     2 g 100 mL/hr over 30 Minutes Intravenous 60 min pre-op 06/05/18 1625 06/10/18 0918   06/05/18 1930  piperacillin-tazobactam (ZOSYN) IVPB 3.375 g     3.375 g 12.5 mL/hr over 240 Minutes Intravenous Every 8 hours 06/05/18 1904     06/05/18 0900  piperacillin-tazobactam (ZOSYN) IVPB 3.375 g  Status:  Discontinued     3.375 g 12.5 mL/hr over 240 Minutes Intravenous Every 8 hours 06/05/18 0833 06/05/18 1904   06/03/18 1000  hydroxychloroquine (PLAQUENIL) tablet 200 mg  Status:  Discontinued     200 mg Oral 2 times daily 06/03/18 0742 06/05/18 0731   06/03/18 0730  piperacillin-tazobactam (ZOSYN) IVPB 3.375 g     3.375 g 12.5 mL/hr over 240 Minutes Intravenous  Once 06/03/18 0726 06/03/18 1246    06/03/18 0630  ciprofloxacin (CIPRO) IVPB 400 mg  Status:  Discontinued     400 mg 200 mL/hr over 60 Minutes Intravenous  Once 06/03/18 0617 06/03/18 0725   06/03/18 0630  metroNIDAZOLE (FLAGYL) IVPB 500 mg  Status:  Discontinued     500 mg 100 mL/hr over 60 Minutes Intravenous  Once 06/03/18 0617 06/03/18 0725       Assessment/Plan AKI Hypothyroidism Anxiety and depression Restless leg syndrome HLD  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 Rosendo Gros - POD#6 - Afebrile, VSS, leukocytosisimproving yesterday, awaiting return today (will check UA given some urgency to make sure this isn't source of WBC, although doubtful given she is still on abx therapy) -ileus resolving.  DC NGT and give clear liquids -Continue Zosyn -PT ordered for mobilization, HH PT recommended -pulm toilet - increase dressing changes to TID wet-to-dry dressing changes to midline wound to try and clean the base up some.  If this cleans up then maybe she can eventually have a VAC placed - IV tylenol and robaxin   PCM -On TNA -will try to wean soon if she tolerates diet  Hypokalemia -resolved  FEN: clear liquids, TNA ID: Zosyn 8/8 >> VTE: SCD's, Lovenox  Foley:DC8/9    LOS: 7 days    Henreitta Cea , [facility] 06/11/2018, 8:37 AM Pager: [phone]

## 2018-06-11 NOTE — Consult Note (Signed)
[place] Nurse ostomy follow up F/U teaching and assessment of ostomy completed in [place].  Husband present. The stoma produced 100 ml of brown pudding consistency stool. The surgical PA requested I help get the patient to the bathroom and have her empty her fecal pouch.  The primary RN, Mardene Celeste, disconnected the NG tube and with her husband pushing the IV pole and me providing touch assistance, she ambulated to the bathroom toilet and voided.  I placed an ostomy belt to provide additional support to the pouching system.  Then she ambulated to the bathroom sink where she emptied her pouch with minimal assistance.  She cleaned the pouch tail, closed it, and ambulated to the recliner chair.  She tolerated the events very well.  My plan is to return to her after lunch and evaluate the patency of the pouch. Convex pouching supplies are in the storage closet outside her room.  We still may need to switch to a convex pouch, but at this point it is not clear to me. Val Riles, RN, MSN, CWOCN, CNS-BC, pager [phone]

## 2018-06-11 NOTE — Progress Notes (Signed)
PROGRESS NOTE    [name]  ZOX:[id number] DOB: [birth year] DOA: 06/03/2018 PCP: Kathyrn Lass, MD    Brief Narrative:  [name] is an 62 y.o. female with a Past Medical History of uterine CA; RLS; PAT; hypothyroidism; fibromyalgia/chronic fatigue (not on chronic opiates); and leukocytoclastic vasculitis who presents with severe abdominal pain x 2 days with n/v. Patient has a h/o constipation and takes Linzess prn. Her last BM was Wednesday. She was found to have a stercoral ulcer with surrounding colitis and moderate stool retention.  Did not respond to enemas and on the AM of 8/7 patient's conditioned worsened with peritonitis.  KUB showed air fluid levels, CT scan showed volvulus and patient was taken for an ex lap and eventual sigmoid colectomy/colostomy.  Recovery is expected to be long with anticipated ileus and possible abdominal abscess.     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   -Patient with some complaints of abdominal tightness on 06/10/2018 which is improved.. -Wound care following. -Patient status post PICC line and on TPN.  Continue empiric IV Zosyn. -General surgery following and managing. -course/hospitalization most likely will be complicated with possible ileus/abdominal abscess  -PCA has been changed to IV pain medication boluses per general surgery.   -Per Surgery.   Leukocytosis -Questionable etiology.  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   Leukocytosis fluctuating and slowly trending down.  Patient currently afebrile.  Blood cultures with no growth to date.  Urinalysis unremarkable.  Continue empiric IV Zosyn.  AKI -Likely secondary to prerenal azotemia.  Improved with hydration.  Saline lock IV fluids.  Patient placed on IV Lasix due to volume overload.  Monitor renal function closely.  Follow.   Hypothyroidism: Continue IV Synthroid for now.  Outpatient follow-up.  History of vasculitis of the skin,followed by dermatology. The patient is on methotrexate ,leucovorin weekly, plaquenil -Patient currently n.p.o.  Could resume oral medications once tolerating oral intake.   History of cystitis, on [place] Likely resume p.o. medications when bowels are moving and tolerating oral intake.   Anxiety and Depression Continue to hold oral medications.  Once that has been advanced and patient on a solid diet will resume home regimen of medications.  Restless leg syndrome Oral medications on hold as patient currently n.p.o.  Resume when on a diet.    Hypertension Blood pressure improved with increased dose of IV Lopressor and IV Lasix.  Once patient tolerating oral intake and diet has been advanced to transition back to home regimen of oral beta-blocker.  Hyperlipidemia Oral medications on hold as patient currently n.p.o.  Will resume once tolerating oral intake.    Elevated Glucose  HgbA1c <6  Hypokalemia -Replete potassium.  Keep potassium greater than 4.  We will give a dose of IV magnesium to keep magnesium greater than 2. Follow.    Gastroesophageal reflux disease Protonix 40 mg daily.  GI cocktail as needed.  Volume overload Patient with complaints of lower extremity swelling on 06/10/2018.  Patient placed  on IV Lasix with a urine output of 2.850 L over the past 24 hours.  Continue IV Lasix for 2-3 more doses.  Reassess in the morning.  Follow.   DVT prophylaxis: Lovenox Code Status: DNR Family  Communication: Updated patient and husband at bedside. Disposition Plan: To be determined.  Per general surgery.   Consultants:   Gastroenterology: Dr. Michail Sermon 06/03/2018  General surgery: Dr. Rosendo Gros 06/05/2018  Procedures:  Renal ultrasound 06/05/2018  Acute abdominal series 06/05/2018  Abdominal film 06/05/2018  CT abdomen and pelvis 06/03/2018, 06/05/2018  Exploratory laparotomy, sigmoid colectomy, colostomy per Drs. Ramirez/Cornett 06/05/2018  Antimicrobials:   IV Zosyn 06/03/2018>>>>   Subjective: Sitting up in chair.  States she feels better than yesterday.  Feels lower extremity swelling is improving with diuretics.  No shortness of breath.  No chest pain.  Colostomy with stool and gas noted per patient.  NG tube has been removed.  Patient started on clears.  Patient with complaints of heartburn.  Objective: Vitals:   06/10/18 2205 06/11/18 0427 06/11/18 0541 06/11/18 0855  BP: (!) 158/73  (!) 141/62   Pulse: 79  80   Resp: 16  16   Temp: 99 F (37.2 C)  98.7 F (37.1 C)   TempSrc: Oral  Oral   SpO2: 99%  99% 99%  Weight:  86.9 kg    Height:        Intake/Output Summary (Last 24 hours) at 06/11/2018 1223 Last data filed at 06/11/2018 1117 Gross per 24 hour  Intake 2547.45 ml  Output 4160 ml  Net -1612.55 ml   Filed Weights   06/08/18 0500 06/10/18 0444 06/11/18 0427  Weight: 82.2 kg 91.9 kg 86.9 kg    Examination:  General exam: NAD Respiratory system: Some bibasilar crackles otherwise clear.  No wheezing.  No rhonchi.  Fair air movement. Respiratory effort normal. Cardiovascular system: RRR no murmurs rubs or gallops.  No JVD.  2+ bilateral lower extremity nonpitting edema.  Gastrointestinal system: Abdomen with less diffuse tenderness to palpation, soft, non-distended. Colostomy intact with stool noted.  Central nervous system: Alert and oriented. No focal neurological deficits. Extremities: 2 + BLE nonpitting edemaSymmetric 5 x 5 power. Skin: No rashes,  lesions or ulcers Psychiatry: Judgement and insight appear normal. Mood & affect appropriate.     Data Reviewed: I have personally reviewed following labs and imaging studies  CBC: Recent Labs  Lab 06/06/18 0444 06/07/18 0406 06/08/18 0408 06/09/18 0431 06/10/18 0425  WBC 18.1* 16.7* 17.7* 18.1* 16.5*  NEUTROABS  --  14.3*  --   --  12.6*  HGB 12.6 9.5* 9.4* 10.0* 9.9*  HCT 37.8 29.5* 28.1* 30.0* 31.0*  MCV 92.0 92.8 92.1 92.6 94.8  PLT 450* 387 392 441* 703*   Basic Metabolic Panel: Recent Labs  Lab 06/06/18 0444 06/07/18 0406 06/08/18 0408 06/09/18 0431 06/10/18 0425  NA 135 137 139 140 138  K 3.7 3.4* 3.4* 3.7 4.0  CL 107 110 112* 108 104  CO2 16* 17* 20* 24 27  GLUCOSE 168* 116* 117* 129* 121*  BUN 33* 38* 25* 18 16  CREATININE 1.42* 1.48* 0.92 0.72 0.67  CALCIUM 8.1* 7.9* 7.8* 7.9* 7.7*  MG  --  2.4 2.1 2.0 1.7  PHOS  --  2.4* 2.8 2.9 3.1   GFR: Estimated Creatinine Clearance: 78.4 mL/min (by C-G formula based on SCr of 0.67 mg/dL). Liver Function Tests: Recent Labs  Lab 06/07/18 0406 06/10/18 0425  AST 20 25  ALT 17 18  ALKPHOS 54 92  BILITOT 0.7 0.5  PROT 4.7* 5.1*  ALBUMIN 1.9* 1.9*   No results for input(s): LIPASE, AMYLASE in the last 168 hours. No results for input(s): AMMONIA in the last 168 hours. Coagulation Profile: No results for input(s): INR, PROTIME in the last 168 hours. Cardiac Enzymes: No results for input(s): CKTOTAL, CKMB, CKMBINDEX, TROPONINI in the last 168 hours. BNP (last 3 results) No results for input(s): PROBNP in the last 8760 hours. HbA1C: No results for input(s): HGBA1C in the last 72 hours. CBG: Recent Labs  Lab 06/10/18 1211 06/10/18 1817 06/11/18 0033 06/11/18 0543 06/11/18 1200  GLUCAP 136* 103* 101* 131* 126*   Lipid Profile: Recent Labs    06/10/18 0425  TRIG 195*   Thyroid Function Tests: No results for input(s): TSH, T4TOTAL, FREET4, T3FREE, THYROIDAB in the last 72 hours. Anemia Panel: No  results for input(s): VITAMINB12, FOLATE, FERRITIN, TIBC, IRON, RETICCTPCT in the last 72 hours. Sepsis Labs: No results for input(s): PROCALCITON, LATICACIDVEN in the last 168 hours.  Recent Results (from the past 240 hour(s))  Culture, blood (routine x 2)     Status: None   Collection Time: 06/03/18  9:20 AM  Result Value Ref Range Status   Specimen Description BLOOD LEFT ANTECUBITAL  Final   Special Requests   Final    BOTTLES DRAWN AEROBIC ONLY Blood Culture adequate volume   Culture   Final    NO GROWTH 5 DAYS Performed at [facility], [address], [place], [place] [address]    Report Status 06/08/2018 FINAL  Final  Culture, blood (routine x 2)     Status: None   Collection Time: 06/03/18  9:30 AM  Result Value Ref Range Status   Specimen Description BLOOD BLOOD LEFT HAND  Final   Special Requests   Final    BOTTLES DRAWN AEROBIC ONLY Blood Culture adequate volume   Culture   Final    NO GROWTH 5 DAYS Performed at [facility], [place] [address] [place], [place] [address]    Report Status 06/08/2018 FINAL  Final  Surgical pcr screen     Status: None   Collection Time: 06/05/18  4:27 PM  Result Value Ref Range Status   MRSA, PCR NEGATIVE NEGATIVE Final   Staphylococcus aureus NEGATIVE NEGATIVE Final    Comment: (NOTE) The Xpert SA Assay (FDA approved for NASAL specimens in patients 57 years of age and older), is one component of a comprehensive surveillance program. It is not intended to diagnose infection nor to guide or monitor treatment. Performed at [facility], [place] [address], [place],  [address]          Radiology Studies: Dg Abd Portable 1v  Result Date: 06/10/2018 CLINICAL DATA:  Postoperative abdominal pain and ileus. EXAM: PORTABLE ABDOMEN - 1 VIEW COMPARISON:  CT scan and radiograph of June 05, 2018. FINDINGS: The bowel gas pattern is normal. Surgical drain is seen in the pelvis. Status post cholecystectomy. Distal tip of  nasogastric tube seen in proximal stomach. IMPRESSION: No evidence of bowel obstruction or ileus. Surgical drain is noted in the pelvis. Electronically Signed   By: Marijo Conception, M.D.   On: 06/10/2018 13:12        Scheduled Meds: . enoxaparin (LOVENOX) injection  40 mg Subcutaneous Daily  . furosemide  40 mg Intravenous Q12H  . insulin aspart  0-9 Units Subcutaneous Q6H  . levothyroxine  68.5 mcg Intravenous Daily  .  metoprolol tartrate  7.5 mg Intravenous Q6H  . mometasone-formoterol  2 puff Inhalation BID  . sodium chloride flush  10-40 mL Intracatheter Q12H   Continuous Infusions: . methocarbamol (ROBAXIN) IV Stopped (06/11/18 0629)  . ondansetron (ZOFRAN) IV Stopped (06/11/18 0554)  . piperacillin-tazobactam (ZOSYN)  IV 3.375 g (06/11/18 1132)  . TPN ADULT (ION) 80 mL/hr at 06/11/18 0652  . TPN ADULT (ION)       LOS: 7 days    Time spent: 40 minutes    Irine Seal, MD [facility] Pager [phone]  If 7PM-7AM, please contact night-coverage [email or web address] Password [facility] 06/11/2018, 12:23 PM

## 2018-06-11 NOTE — Consult Note (Signed)
[place] Nurse ostomy follow up The pouch placed the morning of 06/10/18 (flat, 2 piece with barrier ring) continues to hold. No signs of leakage. Ostomy belt in place and assisting with pouch security.  Patient states she emptied the pouch a second time today, independently.  My plan is to see her again tomorrow. Val Riles, RN, MSN, CWOCN, CNS-BC, pager [phone]

## 2018-06-12 ENCOUNTER — Inpatient Hospital Stay (HOSPITAL_COMMUNITY): Payer: Medicare Other

## 2018-06-12 DIAGNOSIS — K562 Volvulus: Principal | ICD-10-CM

## 2018-06-12 DIAGNOSIS — E039 Hypothyroidism, unspecified: Secondary | ICD-10-CM

## 2018-06-12 DIAGNOSIS — L959 Vasculitis limited to the skin, unspecified: Secondary | ICD-10-CM

## 2018-06-12 DIAGNOSIS — N179 Acute kidney failure, unspecified: Secondary | ICD-10-CM

## 2018-06-12 LAB — CBC
HCT: 31.5 % — ABNORMAL LOW (ref 36.0–46.0)
Hemoglobin: 10 g/dL — ABNORMAL LOW (ref 12.0–15.0)
MCH: 29.9 pg (ref 26.0–34.0)
MCHC: 31.7 g/dL (ref 30.0–36.0)
MCV: 94.3 fL (ref 78.0–100.0)
PLATELETS: 469 10*3/uL — AB (ref 150–400)
RBC: 3.34 MIL/uL — ABNORMAL LOW (ref 3.87–5.11)
RDW: 13.1 % (ref 11.5–15.5)
WBC: 17.1 10*3/uL — ABNORMAL HIGH (ref 4.0–10.5)

## 2018-06-12 LAB — BASIC METABOLIC PANEL
Anion gap: 11 (ref 5–15)
BUN: 18 mg/dL (ref 8–23)
CALCIUM: 7.6 mg/dL — AB (ref 8.9–10.3)
CO2: 31 mmol/L (ref 22–32)
Chloride: 94 mmol/L — ABNORMAL LOW (ref 98–111)
Creatinine, Ser: 0.81 mg/dL (ref 0.44–1.00)
GFR calc Af Amer: >60 mL/min (ref >60)
GLUCOSE: 119 mg/dL — AB (ref 70–99)
POTASSIUM: 3.5 mmol/L (ref 3.5–5.1)
Sodium: 136 mmol/L (ref 135–145)

## 2018-06-12 LAB — GLUCOSE, CAPILLARY
Glucose-Capillary: 100 mg/dL — ABNORMAL HIGH (ref 70–99)
Glucose-Capillary: 111 mg/dL — ABNORMAL HIGH (ref 70–99)
Glucose-Capillary: 112 mg/dL — ABNORMAL HIGH (ref 70–99)
Glucose-Capillary: 122 mg/dL — ABNORMAL HIGH (ref 70–99)
Glucose-Capillary: 99 mg/dL (ref 70–99)

## 2018-06-12 MED ORDER — TRACE MINERALS CR-CU-MN-SE-ZN 10-1000-500-60 MCG/ML IV SOLN
INTRAVENOUS | Status: DC
  Filled 2018-06-12: qty 1209.6

## 2018-06-12 MED ORDER — TRACE MINERALS CR-CU-MN-SE-ZN 10-1000-500-60 MCG/ML IV SOLN
INTRAVENOUS | Status: DC
  Administered 2018-06-12 (×2): via INTRAVENOUS
  Filled 2018-06-12: qty 604.8

## 2018-06-12 MED ORDER — IOHEXOL 300 MG/ML  SOLN
100.0000 mL | Freq: Once | INTRAMUSCULAR | Status: AC | PRN
  Administered 2018-06-12: 100 mL via INTRAVENOUS

## 2018-06-12 MED ORDER — TRAVASOL 10 % IV SOLN: INTRAVENOUS | Status: AC

## 2018-06-12 MED ORDER — IOPAMIDOL (ISOVUE-300) INJECTION 61%
INTRAVENOUS | Status: AC
  Administered 2018-06-12: 05:00:00
  Filled 2018-06-12: qty 30

## 2018-06-12 MED ORDER — ENSURE ENLIVE PO LIQD
237.0000 mL | Freq: Two times a day (BID) | ORAL | Status: DC
  Administered 2018-06-12: 237 mL via ORAL

## 2018-06-12 NOTE — Progress Notes (Addendum)
PHARMACY - ADULT TOTAL PARENTERAL NUTRITION  Pharmacy Consult:  TPN Indication:  Possible ileus  Patient Measurements: Height: 5\' 5"  (165.1 cm) Weight: 191 lb 12.8 oz (87 kg) IBW/kg (Calculated) : 57 TPN AdjBW (KG): 61.6 Body mass index is 31.92 kg/m.  Assessment:  62 year old female admitted on 06/03/18 with severe abdominal pain with N&V for 3 days.  PMH significant for IBS and chronic constipation.  She was found to have sigmoid volvulus with bowel necrosis and underwent ex-lap on 06/05/18.  Patient has been essentially NPO since 05/31/18. Per documentation, patient's appetite declined over the past 3 months and has lost 20-30 lbs.  GI: ileus.  BL prealbumin 5 > 7.2, NG O/P 317mL, drain O/P 36mL.  Scheduled Zofran (last QTc 475ms) CLD started  Endo: IV Synthroid.  A1c 5.3% - CBGs controlled Insulin requirements in the past 24 hours: 2 units Lytes: Coca 9.3, K 3.5, lytes WNL Renal: SCr down to 0.8, U/S negative Pulm: stable on RA - Dulera Cards: BP elevated, HR 80s - IV metoprolol, Lasix bid Hepatobil: LFTs / tbili / TG WNL Neuro: Dilaudid PCA, Robaxin - pain score 0-4 ID: Zosyn (8/7 >> ) for IAI - afebrile, BCx NGTD TPN Access: PICC placed 8/8/1 TPN start date: 06/07/18   Nutritional Goals (RD rec on 8/9): 1800-2000 kCal, 115-130gm protein, ~1.8L per day  Current Nutrition:  TPN   Plan:  Continue TPN at goal rate 80 ml/hr TPN will provide 121g AA, 287g CHO and 42g ILE for a total of 1886 kCal, meeting 100% of needs Electrolytes in TPN: Increase K+, others to standard with EH:OZYYQMG 1:1 Daily multivitamin and trace elements in TPN Pepcid 20mg  in daily TPN Continue sensitive SSI q6h F/u diet F/u LOT zosyn     Harvel Quale 06/12/2018 10:10 AM   Addendum -MD asked to reduce TPN rate and wean to off tomorrow -Will keep contents the same, will start tonight's bag at 40 ml/hr and wean to off tomorrow  Harvel Quale 06/12/2018 11:28 AM

## 2018-06-12 NOTE — Consult Note (Signed)
[employer] Nurse ostomy follow up To patient's room 6N23 for pouch change.  Patient in recliner having breakfast.  I plan to return after lunch today.  No leakage from pouch placed 2 days ago. Val Riles, RN, MSN, CWOCN, CNS-BC, pager [phone]

## 2018-06-12 NOTE — Progress Notes (Signed)
Physical Therapy Treatment Patient Details Name: [name] MRN: [id number] DOB: 04/29/1956 Today's Date: 06/12/2018    History of Present Illness Pt is a 62 y/o female admitted secondary to abdominal pain. Pt is s/p exploratory laparotomy, sigmoid colectomy, and colostomy. PMH including but not limited to fibromyalgia.    PT Comments    Pt is supervision for all mobility and gait with RW.  I would like to work towards weaning pt from the [place] as she likes to flex over it.  Today was not the day to do this, however, as she reports feeling very fatigued, sore, and nauseated today.  She did not look to have touched any of her lunch liquids.  Pt left in the chair waiting for some visitors to arrive.  PT will follow acutely to progress safe mobility.    Follow Up Recommendations  No PT follow up;Supervision for mobility/OOB     Equipment Recommendations  Rolling walker with 5" wheels    Recommendations for Other Services   NA     Precautions / Restrictions Precautions Precautions: Fall Precaution Comments: colostomy, drain, abdominal    Mobility  Bed Mobility Overal bed mobility: Needs Assistance Bed Mobility: Rolling;Sidelying to Sit Rolling: Supervision Sidelying to sit: Supervision       General bed mobility comments: supervision for safety, cues for log roll and to brace with pillow, pt using bed rail for leverage to help get up  Transfers Overall transfer level: Needs assistance Equipment used: None;Rolling walker (2 wheeled) Transfers: Sit to/from [facility] Sit to Stand: Supervision Stand pivot transfers: Supervision       General transfer comment: supervision for safety, pt transferred to [facility] without AD, then stood from [facility] to walk with RW  Ambulation/Gait Ambulation/Gait assistance: Supervision Gait Distance (Feet): 130 Feet Assistive device: Rolling walker (2 wheeled) Gait Pattern/deviations: Step-through pattern;Trunk flexed Gait velocity:  decreased Gait velocity interpretation: 1.31 - 2.62 ft/sec, indicative of limited community ambulator General Gait Details: Pt with slow, steady gait pattern, flexed trunk as he abdomen is really sore this PM.  Verbal cues for upright posture.  I would like to work towards walking without an AD as I feel the walker encourages her to hunch over.           Balance Overall balance assessment: Needs assistance Sitting-balance support: Feet supported;No upper extremity supported Sitting balance-Leahy Scale: Good     Standing balance support: Single extremity supported;No upper extremity supported Standing balance-Leahy Scale: Fair                              Cognition Arousal/Alertness: Awake/alert Behavior During Therapy: WFL for tasks assessed/performed Overall Cognitive Status: Within Functional Limits for tasks assessed                                           General Comments General comments (skin integrity, edema, etc.): Pt reports complance with LE exercises given in previous session.  She wants to sit up in the chiar as she has some visitors coming soon.       Pertinent Vitals/Pain Pain Assessment: 0-10 Pain Score: 7  Pain Location: abdomen Pain Descriptors / Indicators: Sore;Grimacing;Guarding Pain Intervention(s): Limited activity within patient's tolerance;Monitored during session;Repositioned;Patient requesting pain meds-RN notified;RN gave pain meds during session  PT Goals (current goals can now be found in the care plan section) Acute Rehab PT Goals Patient Stated Goal: decrease pain Progress towards PT goals: Progressing toward goals    Frequency    Min 3X/week      PT Plan Current plan remains appropriate       AM-PAC PT "6 Clicks" Daily Activity  Outcome Measure  Difficulty turning over in bed (including adjusting bedclothes, sheets and blankets)?: A Little Difficulty moving from lying on back to  sitting on the side of the bed? : A Little Difficulty sitting down on and standing up from a chair with arms (e.g., wheelchair, bedside commode, etc,.)?: None Help needed moving to and from a bed to chair (including a wheelchair)?: None Help needed walking in hospital room?: None Help needed climbing 3-5 steps with a railing? : A Little 6 Click Score: 21    End of Session   Activity Tolerance: Patient limited by fatigue;Patient limited by pain Patient left: in chair;with call bell/phone within reach   PT Visit Diagnosis: Other abnormalities of gait and mobility (R26.89);Pain Pain - Right/Left: (incisional) Pain - part of body: (abdomen)     Time: 8786-7672 PT Time Calculation (min) (ACUTE ONLY): 17 min  Charges:  $Gait Training: 8-22 mins          Richrd Kuzniar B. [place], [place], DPT [phone]            06/12/2018, 5:17 PM

## 2018-06-12 NOTE — Consult Note (Signed)
[place] Nurse ostomy follow up In to [facility] room 6N23 to check on ostomy.  Patient asleep.  Plan to follow up tomorrow. Spouse not in room currently. Val Riles, RN, MSN, CWOCN, CNS-BC, pager [phone]

## 2018-06-12 NOTE — Progress Notes (Signed)
Nutrition Follow-up  DOCUMENTATION CODES:   Obesity unspecified  INTERVENTION:   -Continue Ensure Enlive po BID, each supplement provides 350 kcal and 20 grams of protein -TPN management per pharmacy  NUTRITION DIAGNOSIS:   Increased nutrient needs related to post-op healing, wound healing as evidenced by estimated needs.  Ongoing  GOAL:   Patient will meet greater than or equal to 90% of their needs  Met with TPN  MONITOR:   PO intake, Supplement acceptance, Diet advancement, Labs, Weight trends, Skin, I & O's  REASON FOR ASSESSMENT:   Consult New TPN/TNA  ASSESSMENT:   [name] is a 62 y.o. female with a Past Medical History of uterine CA; RLS; PAT; hypothyroidism; fibromyalgia/chronic fatigue (not on chronic opiates); and leukocytoclastic vasculitis who presents with severe abdominal pain x 2 days with n/v.    8/7- NGT placed; CT revealed sigmoid volvulus; s/p ex-lap. Sigmoid colectomy with colostomy 8/8- PICC placed 8/9- TPN initiated 8/13- NGT removed, advanced to clear liquid diet  Pt sleeping soundly at time of visit. RD did not disturb. Observed breakfast tray- pt consumed 75% of jello, 100% of grape juice, and 75% of chicken broth.   Pt has been advanced to full liquid diet for lunch today. General surgery has also ordered Ensure Enlive BID- first dose to be given at 1300.   Pt remains on TPN- infusing via PICC at 80 ml/hr, which provides 1886 kcals and 121 grams protein, meeting 100% of estimated nutritional needs.   Labs reviewed: K WDL, CBGS: 111-122 (inpatient orders for glycemic control are 0-9 units insulin aspart every 6 hours).   Diet Order:   Diet Order            Diet full liquid Room service appropriate? Yes; Fluid consistency: Thin  Diet effective now              EDUCATION NEEDS:   Education needs have been addressed  Skin:  Skin Assessment: Skin Integrity Issues: Skin Integrity Issues:: Incisions Incisions: closed abdominal  incision  Last BM:  06/11/18 (50 ml output via colostomy)  Height:   Ht Readings from Last 1 Encounters:  06/03/18 5' 5"  (1.651 m)    Weight:   Wt Readings from Last 1 Encounters:  06/12/18 87 kg    Ideal Body Weight:  56.8 kg  BMI:  Body mass index is 31.92 kg/m.  Estimated Nutritional Needs:   Kcal:  1800-2000  Protein:  115-130 grams  Fluid:  > 1.8 L    [name] A. [name], RD, LDN, CDE Pager: [phone] After hours Pager: [phone]

## 2018-06-12 NOTE — Progress Notes (Signed)
PROGRESS NOTE        PATIENT DETAILS Name: [name] Age: 62 y.o. Sex: female Date of Birth: [birth year] Admit Date: 06/03/2018 Admitting Physician Karmen Bongo, MD [id number], Lattie Haw, MD  Brief Narrative: Patient is a 62 y.o. female with history of rhythm, leukocytoclastic vasculitis of the skin, anxiety/depression severe abdominal pain, CT of the abdomen showed volvulus with bowel necrosis and perforation.  Patient underwent exploratory laparotomy with sigmoidectomy and colostomy on 8/7.  Hospital course complicated by mild volume overload and leukocytosis.  See below for further details  Subjective: Feels much better.  Brown stools in ostomy bag.  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  Postop care being managed by general surgery, on empiric antimicrobial therapy.  Still on TNA.  CT of the abdomen done today for evaluation of leukocytosis does not show any abscess.    Leukocytosis: Could be from just inflammation related to bowel surgery-thankfully CT of the abdomen does not show abscess.  Continue Zosyn-we will require a repeat scan at some point especially if leukocytosis worsens.  Acute kidney injury: Likely hemodynamically mediated, improved with hydration.  Volume overload: Improved-but still with excess volume on board-continue Lasix.  History of vasculitis involving the skin: Hold Plaquenil, methotrexate and leucovorin.  Hypothyroidism: Continue with IV levothyroxine-if diet continues to be stable we will change to oral levothyroxine in the next day or so.  Hypertension: Controlled with IV Lopressor-if diet continues to be stable we will change to oral route over the next day or so.  Anxiety with depression: Stable-once diet advanced further, will resume medication the next day or so.  Dyslipidemia: Resume statins when able.  History  of cystitis: Resume elmiron in the next day or so when diet is more stable.  DVT Prophylaxis: Prophylactic Lovenox   Code Status: Full code   Family Communication: Spouse at bedside  Disposition Plan: Remain inpatient-but will plan on Home health vs SNF on discharge and require several more days of hospital stay.  Antimicrobial agents: Anti-infectives (From admission, onward)   Start     Dose/Rate Route Frequency Ordered Stop   06/06/18 0600  cefoTEtan (CEFOTAN) 2 g in sodium chloride 0.9 % 100 mL IVPB  Status:  Discontinued     2 g 200 mL/hr over 30 Minutes Intravenous On call to O.R. 06/05/18 1608 06/05/18 1624   06/06/18 0600  cefoTEtan in Dextrose 5% (CEFOTAN) IVPB 2 g  Status:  Discontinued     2 g 100 mL/hr over 30 Minutes Intravenous 60 min pre-op 06/05/18 1625 06/10/18 0918   06/05/18 1930  piperacillin-tazobactam (ZOSYN) IVPB 3.375 g     3.375 g 12.5 mL/hr over 240 Minutes Intravenous Every 8 hours 06/05/18 1904     06/05/18 0900  piperacillin-tazobactam (ZOSYN) IVPB 3.375 g  Status:  Discontinued     3.375 g 12.5 mL/hr over 240 Minutes Intravenous Every 8 hours 06/05/18 0833 06/05/18 1904   06/03/18 1000  hydroxychloroquine (PLAQUENIL) tablet 200 mg  Status:  Discontinued     200 mg Oral 2 times daily 06/03/18 0742 06/05/18 0731   06/03/18 0730  piperacillin-tazobactam (ZOSYN) IVPB 3.375 g     3.375 g 12.5 mL/hr over 240 Minutes Intravenous  Once 06/03/18 0726 06/03/18 1246   06/03/18 0630  ciprofloxacin (CIPRO) IVPB 400 mg  Status:  Discontinued     400 mg 200 mL/hr over 60 Minutes Intravenous  Once 06/03/18 0617 06/03/18 0725   06/03/18 0630  metroNIDAZOLE (FLAGYL) IVPB 500 mg  Status:  Discontinued     500 mg 100 mL/hr over 60 Minutes Intravenous  Once 06/03/18 0617 06/03/18 0725      Procedures:  Exploratory laparotomy, sigmoid colectomy, colostomy per Drs. Ramirez/Cornett 06/05/2018  CONSULTS:  GI and general surgery  Time spent: 25 minutes-Greater than  50% of this time was spent in counseling, explanation of diagnosis, planning of further management, and coordination of care.  MEDICATIONS: Scheduled Meds: . enoxaparin (LOVENOX) injection  40 mg Subcutaneous Daily  . feeding supplement (ENSURE ENLIVE)  237 mL Oral BID BM  . insulin aspart  0-9 Units Subcutaneous Q6H  . levothyroxine  68.5 mcg Intravenous Daily  . metoprolol tartrate  7.5 mg Intravenous Q6H  . mometasone-formoterol  2 puff Inhalation BID  . pantoprazole  40 mg Oral Q0600  . sodium chloride flush  10-40 mL Intracatheter Q12H   Continuous Infusions: . methocarbamol (ROBAXIN) IV 500 mg (06/12/18 1406)  . ondansetron (ZOFRAN) IV 8 mg (06/12/18 1407)  . piperacillin-tazobactam (ZOSYN)  IV 3.375 g (06/12/18 1200)  . TPN ADULT (ION)    . TPN ADULT (ION)     PRN Meds:.albuterol, gi cocktail, HYDROmorphone (DILAUDID) injection, LORazepam, promethazine, sodium chloride flush   PHYSICAL EXAM: Vital signs: Vitals:   06/11/18 2159 06/11/18 2352 06/12/18 0501 06/12/18 1403  BP: (!) 163/69 (!) 148/68 (!) 156/65 (!) 151/77  Pulse: 83 84 84 73  Resp: 16 16 16 16   Temp: 100 F (37.8 C) (!) 100.4 F (38 C) 98.9 F (37.2 C) 99.2 F (37.3 C)  TempSrc: Oral Oral Oral Oral  SpO2: 99% 97% 99% 100%  Weight:   87 kg   Height:       Filed Weights   06/10/18 0444 06/11/18 0427 06/12/18 0501  Weight: 91.9 kg 86.9 kg 87 kg   Body mass index is 31.92 kg/m.   General appearance :Awake, alert, not in any distress. Speech Clear. Not toxic Looking Eyes:, pupils equally reactive to light and accomodation,no scleral icterus.Pink conjunctiva HEENT: Atraumatic and Normocephalic Neck: supple, no JVD. No cervical lymphadenopathy. No thyromegaly Resp:Good air entry bilaterally, no added sounds  CVS: S1 S2 regular, no murmurs.  GI: Bowel sounds present, mildly tender-dressing in place-brown stools in ostomy bag. Extremities: B/L Lower Ext shows 2+ edema, both legs are warm to  touch Neurology:  speech clear,Non focal, sensation is grossly intact. Psychiatric: Normal judgment and insight. Alert and oriented x 3. Normal mood. Musculoskeletal:No digital cyanosis Skin:No Rash, warm and dry Wounds:N/A  I have personally reviewed following labs and imaging studies  LABORATORY DATA: CBC: Recent Labs  Lab 06/07/18 0406 06/08/18 0408 06/09/18 0431 06/10/18 0425 06/11/18 1236 06/12/18 0359  WBC 16.7* 17.7* 18.1* 16.5* 19.0* 17.1*  NEUTROABS 14.3*  --   --  12.6*  --   --   HGB 9.5* 9.4* 10.0* 9.9* 10.8* 10.0*  HCT 29.5* 28.1* 30.0* 31.0* 33.1* 31.5*  MCV 92.8 92.1 92.6 94.8 93.0 94.3  PLT 387 392 441* 449* 534* 469*    Basic Metabolic Panel: Recent Labs  Lab 06/07/18 0406 06/08/18 0408 06/09/18 0431 06/10/18 0425 06/11/18 2018 06/12/18 0359  NA 137 139 140 138  --  136  K 3.4* 3.4* 3.7 4.0  --  3.5  CL 110 112* 108 104  --  94*  CO2 17*  20* 24 27  --  31  GLUCOSE 116* 117* 129* 121*  --  119*  BUN 38* 25* 18 16  --  18  CREATININE 1.48* 0.92 0.72 0.67  --  0.81  CALCIUM 7.9* 7.8* 7.9* 7.7*  --  7.6*  MG 2.4 2.1 2.0 1.7 1.9  --   PHOS 2.4* 2.8 2.9 3.1  --   --     GFR: Estimated Creatinine Clearance: 77.4 mL/min (by C-G formula based on SCr of 0.81 mg/dL).  Liver Function Tests: Recent Labs  Lab 06/07/18 0406 06/10/18 0425  AST 20 25  ALT 17 18  ALKPHOS 54 92  BILITOT 0.7 0.5  PROT 4.7* 5.1*  ALBUMIN 1.9* 1.9*   No results for input(s): LIPASE, AMYLASE in the last 168 hours. No results for input(s): AMMONIA in the last 168 hours.  Coagulation Profile: No results for input(s): INR, PROTIME in the last 168 hours.  Cardiac Enzymes: No results for input(s): CKTOTAL, CKMB, CKMBINDEX, TROPONINI in the last 168 hours.  BNP (last 3 results) No results for input(s): PROBNP in the last 8760 hours.  HbA1C: No results for input(s): HGBA1C in the last 72 hours.  CBG: Recent Labs  Lab 06/11/18 1200 06/11/18 1755 06/12/18 0034  06/12/18 0656 06/12/18 1148  GLUCAP 126* 102* 111* 122* 112*    Lipid Profile: Recent Labs    06/10/18 0425  TRIG 195*    Thyroid Function Tests: No results for input(s): TSH, T4TOTAL, FREET4, T3FREE, THYROIDAB in the last 72 hours.  Anemia Panel: No results for input(s): VITAMINB12, FOLATE, FERRITIN, TIBC, IRON, RETICCTPCT in the last 72 hours.  Urine analysis:    Component Value Date/Time   COLORURINE YELLOW 06/11/2018 0836   APPEARANCEUR CLEAR 06/11/2018 0836   LABSPEC 1.016 06/11/2018 0836   PHURINE 8.0 06/11/2018 0836   GLUCOSEU NEGATIVE 06/11/2018 0836   HGBUR NEGATIVE 06/11/2018 0836   BILIRUBINUR NEGATIVE 06/11/2018 0836   KETONESUR NEGATIVE 06/11/2018 0836   PROTEINUR NEGATIVE 06/11/2018 0836   NITRITE NEGATIVE 06/11/2018 0836   LEUKOCYTESUR NEGATIVE 06/11/2018 0836    Sepsis Labs: Lactic Acid, Venous    Component Value Date/Time   LATICACIDVEN 1.4 06/03/2018 0918    MICROBIOLOGY: Recent Results (from the past 240 hour(s))  Culture, blood (routine x 2)     Status: None   Collection Time: 06/03/18  9:20 AM  Result Value Ref Range Status   Specimen Description BLOOD LEFT ANTECUBITAL  Final   Special Requests   Final    BOTTLES DRAWN AEROBIC ONLY Blood Culture adequate volume   Culture   Final    NO GROWTH 5 DAYS Performed at [facility], [place] [address], [place], [place] [address]    Report Status 06/08/2018 FINAL  Final  Culture, blood (routine x 2)     Status: None   Collection Time: 06/03/18  9:30 AM  Result Value Ref Range Status   Specimen Description BLOOD BLOOD LEFT HAND  Final   Special Requests   Final    BOTTLES DRAWN AEROBIC ONLY Blood Culture adequate volume   Culture   Final    NO GROWTH 5 DAYS Performed at [facility], [place] [address], [place], [place] [address]    Report Status 06/08/2018 FINAL  Final  Surgical pcr screen     Status: None   Collection Time: 06/05/18  4:27 PM  Result Value Ref Range Status   MRSA,  PCR NEGATIVE NEGATIVE Final   Staphylococcus aureus NEGATIVE  NEGATIVE Final    Comment: (NOTE) The Xpert SA Assay (FDA approved for NASAL specimens in patients 9 years of age and older), is one component of a comprehensive surveillance program. It is not intended to diagnose infection nor to guide or monitor treatment. Performed at [facility], [place] [address], [place], [place] [address]     RADIOLOGY STUDIES/RESULTS: Ct Abdomen Pelvis Wo Contrast  Result Date: 06/05/2018 CLINICAL DATA:  62 year old female with a history of abdominal pain with nausea vomiting. Constipation. EXAM: CT ABDOMEN AND PELVIS WITHOUT CONTRAST TECHNIQUE: Multidetector CT imaging of the abdomen and pelvis was performed following the standard protocol without IV contrast. COMPARISON:  06/03/2018 FINDINGS: Lower chest: Trace atelectasis and pleural fluid at the bilateral lung bases. Hepatobiliary: Cranial caudal span of the right liver measures 16.4 cm. Surgical changes of cholecystectomy. Pancreas: Unremarkable pancreas. Spleen: Unremarkable spleen Adrenals/Urinary Tract: Bilateral adrenal glands unremarkable. There has been delayed excretion from the right kidney collecting system with mild hydronephrosis. Contrast present through the length of the proximal and mid segment of the right ureter. Left kidney demonstrates near complete evacuation/excretion of contrast from the collecting system and the left ureter. Urinary bladder demonstrates retention of some contrast. Stomach/Bowel: Gastric tube terminates within the stomach. Stomach is decompressed. Distended loops of small bowel with associated air-fluid levels. Since the prior CT there has been significant fluid accumulation throughout the length of the colon, with distention of cecum, right colon, transverse colon and descending colon. There is similar configuration of formed stool within the sigmoid colon, with a loop of sigmoid colon and associated twisted mesentery  of the low abdomen/pelvis. The distal rectum is decompressed. Crescent of air along the anterior aspect of the sigmoid colon on image 70 favored to be within the lumen of bowel. Vascular/Lymphatic: Mild atherosclerosis of the abdominal aorta. No adenopathy. Small volume of free fluid within the left pelvis, present on the comparison CT, likely reactive. Reproductive: Hysterectomy Other: Small fat containing umbilical hernia. Musculoskeletal: No acute displaced fracture. Degenerative changes of the spine. IMPRESSION: CT demonstrates sigmoid volvulus with signs of worsening bowel obstruction. Crescent of air at the anterior aspect of stool-filled sigmoid colon I would favored to be within bowel lumen, as there are no other signs of bowel perforation on the CT. These results were discussed at the time of interpretation on 06/05/2018 at 3:41 pm with Dr. Ralene Ok. Delayed excretion of contrast from the right collecting system, potentially secondary to distal inflammation or compression of the right ureter secondary to pelvic changes. Electronically Signed   By: Corrie Mckusick D.O.   On: 06/05/2018 15:43   Ct Abdomen Pelvis W Contrast  Result Date: 06/12/2018 CLINICAL DATA:  Fever, abdominal pain, status post exploratory laparotomy with partial colectomy on 08/07 EXAM: CT ABDOMEN AND PELVIS WITH CONTRAST TECHNIQUE: Multidetector CT imaging of the abdomen and pelvis was performed using the standard protocol following bolus administration of intravenous contrast. CONTRAST:  167mL OMNIPAQUE IOHEXOL 300 MG/ML  SOLN COMPARISON:  06/05/2018 FINDINGS: Lower chest: Trace right pleural effusion. Mild patchy right lower lobe opacity, likely atelectasis. Hepatobiliary: Liver is within normal limits. Status post cholecystectomy. No intrahepatic or extrahepatic ductal dilatation. Pancreas: Within normal limits. Spleen: Within normal limits. Adrenals/Urinary Tract: Adrenal glands are within normal limits. Kidneys are within  normal limits.  No hydronephrosis. Bladder is within normal limits. Stomach/Bowel: Stomach is within normal limits. 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 Mildly prominent loops of small bowel in the  left mid abdomen, favoring adynamic postoperative ileus. Vascular/Lymphatic: No evidence of abdominal aortic aneurysm. Mild atherosclerotic calcifications the abdominal aorta. No suspicious abdominopelvic lymphadenopathy. Reproductive: Status post hysterectomy. No adnexal masses. Other: Tiny scattered foci of nondependent gas beneath the anterior abdominal wall (for example, series 3/images 38 and 50), postsurgical. Multiple interloop fluid collections throughout the abdomen, nonspecific given recent postoperative status. Some of these may be loculated, with mild peripheral enhancement, while many may be free-flowing. The largest collection, along the jejunal mesentery in the left mid abdomen, measures up to 7.8 cm in transverse dimension but only 2.7 cm in AP dimension (series 3/image 42). Additional pelvic fluid with indwelling surgical drain. Musculoskeletal: Postsurgical changes along the midline anterior abdominal wall. Visualized osseous structures are within normal limits. 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 Multiple interloop fluid collections, as described above, nonspecific given recent postoperative status. If symptoms worsen (fever/leukocytosis), consider follow-up CT to assess for resolution and/or abscess development. Mildly prominent loops of small bowel in the left mid abdomen, favoring adynamic postoperative ileus. Additional ancillary findings as above. Electronically Signed   By: Julian Hy M.D.   On: 06/12/2018 10:47   Ct Abdomen Pelvis W Contrast  Result Date: 06/03/2018 CLINICAL DATA:  Lower abdominal pain x2 days with nausea and vomiting. EXAM: CT ABDOMEN AND PELVIS WITH CONTRAST  TECHNIQUE: Multidetector CT imaging of the abdomen and pelvis was performed using the standard protocol following bolus administration of intravenous contrast. CONTRAST:  130mL OMNIPAQUE IOHEXOL 300 MG/ML  SOLN COMPARISON:  10/26/2016 FINDINGS: Lower chest: Normal heart size without pericardial effusion. Subsegmental atelectasis at the lung bases. Small hiatal hernia. Hepatobiliary: Cholecystectomy. No space-occupying mass of the liver. No biliary dilatation. Pancreas: Normal Spleen: Normal Adrenals/Urinary Tract: Normal bilateral adrenal glands. Symmetric cortical enhancement of both kidneys without nephrolithiasis nor obstructive uropathy. The urinary bladder is unremarkable for the degree of distention. Stomach/Bowel: The stomach is decompressed. The duodenal sweep and ligament of Treitz position is normal. Small bowel dilatation or inflammation. The distal and terminal ileum are unremarkable. The appendix is not well visualized. Increased colonic stool burden is noted within the cecum and ascending colon. Additionally there is a large amount of stool with transmural thickening and involving the rectosigmoid suspicious for stercoral colitis. Associated small amount free fluid is seen in the pelvis. Vascular/Lymphatic: No significant vascular findings are present. No enlarged abdominal or pelvic lymph nodes. Reproductive: Status post hysterectomy. No adnexal masses. Other: Small amount of free fluid in the pelvis as described. Musculoskeletal: No acute nor suspicious osseous abnormalities. Lower lumbar facet arthropathy with mild disc flattening at L4-5 and anterolisthesis of L4 on L5. IMPRESSION: Moderate stool retention within the rectosigmoid with transmural thickening, adjacent small volume of free fluid and inflammation consistent with stercoral colitis. Electronically Signed   By: Ashley Royalty M.D.   On: 06/03/2018 03:54   US Renal  Result Date: 06/05/2018 CLINICAL DATA:  Acute kidney injury EXAM: RENAL /  URINARY TRACT ULTRASOUND COMPLETE COMPARISON:  None. FINDINGS: Right Kidney: Length: 11.3 cm. Echogenicity within normal limits. No mass or hydronephrosis visualized. Left Kidney: Length: 11 cm. Echogenicity within normal limits. No mass or hydronephrosis visualized. Bladder: Appears normal for degree of bladder distention. IMPRESSION: Normal renal ultrasound. Electronically Signed   By: Kathreen Devoid   On: 06/05/2018 10:50   Dg Abd Acute W/chest  Result Date: 06/05/2018 CLINICAL DATA:  Constipation; abdominal pain EXAM: DG ABDOMEN ACUTE W/ 1V CHEST COMPARISON:  Chest radiograph Mar 01, 2011; CT abdomen and pelvis June 03, 2018 FINDINGS: PA chest: There is atelectatic change in the right base. Lungs elsewhere are clear. Heart size and pulmonary vascularity are normal. No adenopathy. Supine and upright abdomen: There is distension of the distal sigmoid colon and upper rectum due to stool. There are loops of dilated bowel with scattered air-fluid levels. No free air evident. Surgical clips are noted in the right upper quadrant region. There are phleboliths in the pelvis. IMPRESSION: Bowel dilatation with scattered air-fluid levels. A degree of bowel obstruction is felt to be present. Note that there are loops of both small and large bowel dilatation with air-fluid levels in these areas. Question functional bowel obstruction due to relative obstruction of sigmoid colon due to stool. The appearance currently is consistent with the findings suggesting stercoral colitis distally on recent CT. No free air evident. Right base atelectasis.  Lungs elsewhere clear. Electronically Signed   By: Lowella Grip III M.D.   On: 06/05/2018 09:06   Dg Abd Portable 1v  Result Date: 06/10/2018 CLINICAL DATA:  Postoperative abdominal pain and ileus. EXAM: PORTABLE ABDOMEN - 1 VIEW COMPARISON:  CT scan and radiograph of June 05, 2018. FINDINGS: The bowel gas pattern is normal. Surgical drain is seen in the pelvis. Status post  cholecystectomy. Distal tip of nasogastric tube seen in proximal stomach. IMPRESSION: No evidence of bowel obstruction or ileus. Surgical drain is noted in the pelvis. Electronically Signed   By: Marijo Conception, M.D.   On: 06/10/2018 13:12   Dg Abd Portable 1v  Result Date: 06/05/2018 CLINICAL DATA:  Nasogastric tube position EXAM: PORTABLE ABDOMEN - 1 VIEW COMPARISON:  Portable exam 1206 hours compared to 06/05/2018 at 0827 hours FINDINGS: Nasogastric tube coiled in proximal stomach with tip near fundus. Air-filled loops of large and small bowel again identified in the upper abdomen. Surgical clips RIGHT upper quadrant. Bones demineralized. IMPRESSION: Nasogastric tube coiled in proximal stomach. Electronically Signed   By: Lavonia Dana M.D.   On: 06/05/2018 13:02   [place] Ekg Site Rite  Result Date: 06/06/2018 If Site Rite image not attached, placement could not be confirmed due to current cardiac rhythm.    LOS: 8 days   Oren Binet, MD  [facility]  If 7PM-7AM, please contact night-coverage  Please page via [email or web address]-click on MD name and type text message  06/12/2018, 4:56 PM

## 2018-06-12 NOTE — Progress Notes (Signed)
Patient ID: [name], female   DOB: Nov 30, 1955, 62 y.o.   MRN: [id number]    7 Days Post-Op  Subjective: Patient looks great this morning.  Took in some clear liquids yesterday.  Passing flatus and stool in bag.  No nausea.  Objective: Vital signs in last 24 hours: Temp:  [98.6 F (37 C)-100.4 F (38 C)] 98.9 F (37.2 C) (08/14 0501) Pulse Rate:  [81-86] 84 (08/14 0501) Resp:  [16-18] 16 (08/14 0501) BP: (137-163)/(62-69) 156/65 (08/14 0501) SpO2:  [97 %-99 %] 99 % (08/14 0501) Weight:  [87 kg] 87 kg (08/14 0501) Last BM Date: 06/11/18  Intake/Output from previous day: 08/13 0701 - 08/14 0700 In: 2209.3 [I.V.:1015.6; IV Piggyback:1193.7] Out: 5070 [Urine:4910; Drains:10; Stool:150] Intake/Output this shift: No intake/output data recorded.  PE: Abd: soft, appropriately tender, +BS, ND, wound has just been packed and was not undressed.  Stoma with some retraction and ostomy belt in place, but working well with good output.  JP drain with minimal serous drainage, about 1-2cc in bulb.  Lab Results:  Recent Labs    06/11/18 1236 06/12/18 0359  WBC 19.0* 17.1*  HGB 10.8* 10.0*  HCT 33.1* 31.5*  PLT 534* 469*   BMET Recent Labs    06/10/18 0425 06/12/18 0359  NA 138 136  K 4.0 3.5  CL 104 94*  CO2 27 31  GLUCOSE 121* 119*  BUN 16 18  CREATININE 0.67 0.81  CALCIUM 7.7* 7.6*   PT/INR No results for input(s): LABPROT, INR in the last 72 hours. CMP     Component Value Date/Time   NA 136 06/12/2018 0359   K 3.5 06/12/2018 0359   CL 94 (L) 06/12/2018 0359   CO2 31 06/12/2018 0359   GLUCOSE 119 (H) 06/12/2018 0359   BUN 18 06/12/2018 0359   CREATININE 0.81 06/12/2018 0359   CALCIUM 7.6 (L) 06/12/2018 0359   PROT 5.1 (L) 06/10/2018 0425   ALBUMIN 1.9 (L) 06/10/2018 0425   AST 25 06/10/2018 0425   ALT 18 06/10/2018 0425   ALKPHOS 92 06/10/2018 0425   BILITOT 0.5 06/10/2018 0425   GFRNONAA >60 06/12/2018 0359   GFRAA >60 06/12/2018 0359   Lipase       Component Value Date/Time   LIPASE 42 06/03/2018 0140       Studies/Results: Dg Abd Portable 1v  Result Date: 06/10/2018 CLINICAL DATA:  Postoperative abdominal pain and ileus. EXAM: PORTABLE ABDOMEN - 1 VIEW COMPARISON:  CT scan and radiograph of June 05, 2018. FINDINGS: The bowel gas pattern is normal. Surgical drain is seen in the pelvis. Status post cholecystectomy. Distal tip of nasogastric tube seen in proximal stomach. IMPRESSION: No evidence of bowel obstruction or ileus. Surgical drain is noted in the pelvis. Electronically Signed   By: Marijo Conception, M.D.   On: 06/10/2018 13:12    Anti-infectives: Anti-infectives (From admission, onward)   Start     Dose/Rate Route Frequency Ordered Stop   06/06/18 0600  cefoTEtan (CEFOTAN) 2 g in sodium chloride 0.9 % 100 mL IVPB  Status:  Discontinued     2 g 200 mL/hr over 30 Minutes Intravenous On call to O.R. 06/05/18 1608 06/05/18 1624   06/06/18 0600  cefoTEtan in Dextrose 5% (CEFOTAN) IVPB 2 g  Status:  Discontinued     2 g 100 mL/hr over 30 Minutes Intravenous 60 min pre-op 06/05/18 1625 06/10/18 0918   06/05/18 1930  piperacillin-tazobactam (ZOSYN) IVPB 3.375 g     3.375  g 12.5 mL/hr over 240 Minutes Intravenous Every 8 hours 06/05/18 1904     06/05/18 0900  piperacillin-tazobactam (ZOSYN) IVPB 3.375 g  Status:  Discontinued     3.375 g 12.5 mL/hr over 240 Minutes Intravenous Every 8 hours 06/05/18 0833 06/05/18 1904   06/03/18 1000  hydroxychloroquine (PLAQUENIL) tablet 200 mg  Status:  Discontinued     200 mg Oral 2 times daily 06/03/18 0742 06/05/18 0731   06/03/18 0730  piperacillin-tazobactam (ZOSYN) IVPB 3.375 g     3.375 g 12.5 mL/hr over 240 Minutes Intravenous  Once 06/03/18 0726 06/03/18 1246   06/03/18 0630  ciprofloxacin (CIPRO) IVPB 400 mg  Status:  Discontinued     400 mg 200 mL/hr over 60 Minutes Intravenous  Once 06/03/18 0617 06/03/18 0725   06/03/18 0630  metroNIDAZOLE (FLAGYL) IVPB 500 mg  Status:   Discontinued     500 mg 100 mL/hr over 60 Minutes Intravenous  Once 06/03/18 0617 06/03/18 0725       Assessment/Plan AKI Hypothyroidism Anxiety and depression Restless leg syndrome HLD  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 Rosendo Gros - POD#7 - low grade temp overnight to 100.4.  UA yesterday negative.  WBC down to 17K today from 19K yesterday.  Patient awaiting to go to CT to rule out intra-abdominal abscess -clear liquids, but told her not to drink anything today until we discuss CT scan with her incase she were to need a drain. -Continue Zosyn -PT ordered for mobilization, HH PT recommended -pulm toilet -dressing changes to TID wet-to-dry dressing changes to midline wound to try and clean the base up some.  If this cleans up then maybe she can eventually have a VAC placed - IV tylenol and robaxin  PCM -On TNA -will try to wean soon if she tolerates diet  Hypokalemia -resolved  FEN: clear liquids, TNA ID: Zosyn 8/8 >> VTE: SCD's, Lovenox  Foley:DC8/9   LOS: 8 days    Henreitta Cea , [facility] 06/12/2018, 8:20 AM Pager: [phone]

## 2018-06-13 LAB — COMPREHENSIVE METABOLIC PANEL
ALK PHOS: 99 U/L (ref 38–126)
ALT: 19 U/L (ref 0–44)
ANION GAP: 8 (ref 5–15)
AST: 25 U/L (ref 15–41)
Albumin: 1.9 g/dL — ABNORMAL LOW (ref 3.5–5.0)
BILIRUBIN TOTAL: 0.5 mg/dL (ref 0.3–1.2)
BUN: 14 mg/dL (ref 8–23)
CALCIUM: 7.7 mg/dL — AB (ref 8.9–10.3)
CO2: 29 mmol/L (ref 22–32)
CREATININE: 0.79 mg/dL (ref 0.44–1.00)
Chloride: 99 mmol/L (ref 98–111)
Glucose, Bld: 106 mg/dL — ABNORMAL HIGH (ref 70–99)
Potassium: 3.8 mmol/L (ref 3.5–5.1)
Sodium: 136 mmol/L (ref 135–145)
TOTAL PROTEIN: 4.8 g/dL — AB (ref 6.5–8.1)

## 2018-06-13 LAB — MAGNESIUM: MAGNESIUM: 2 mg/dL (ref 1.7–2.4)

## 2018-06-13 LAB — GLUCOSE, CAPILLARY
Glucose-Capillary: 116 mg/dL — ABNORMAL HIGH (ref 70–99)
Glucose-Capillary: 97 mg/dL (ref 70–99)
Glucose-Capillary: 108 mg/dL — ABNORMAL HIGH (ref 70–99)

## 2018-06-13 LAB — PHOSPHORUS: PHOSPHORUS: 3.1 mg/dL (ref 2.5–4.6)

## 2018-06-13 MED ORDER — PREGABALIN 75 MG PO CAPS
75.0000 mg | ORAL_CAPSULE | Freq: Two times a day (BID) | ORAL | Status: DC
  Administered 2018-06-13 – 2018-06-15 (×5): 75 mg via ORAL
  Filled 2018-06-13 (×5): qty 1

## 2018-06-13 MED ORDER — LEVOTHYROXINE SODIUM 25 MCG PO TABS
137.0000 ug | ORAL_TABLET | Freq: Every day | ORAL | Status: DC
  Administered 2018-06-14 – 2018-06-15 (×2): 137 ug via ORAL
  Filled 2018-06-13 (×2): qty 1

## 2018-06-13 MED ORDER — ADULT MULTIVITAMIN W/MINERALS CH
1.0000 | ORAL_TABLET | Freq: Every day | ORAL | Status: DC
  Administered 2018-06-14 – 2018-06-15 (×2): 1 via ORAL
  Filled 2018-06-13 (×2): qty 1

## 2018-06-13 MED ORDER — METOPROLOL SUCCINATE ER 25 MG PO TB24
25.0000 mg | ORAL_TABLET | Freq: Every day | ORAL | Status: DC
  Administered 2018-06-13 – 2018-06-15 (×3): 25 mg via ORAL
  Filled 2018-06-13 (×3): qty 1

## 2018-06-13 MED ORDER — VENLAFAXINE HCL 37.5 MG PO TABS
37.5000 mg | ORAL_TABLET | Freq: Two times a day (BID) | ORAL | Status: DC
  Administered 2018-06-13 – 2018-06-15 (×4): 37.5 mg via ORAL
  Filled 2018-06-13 (×4): qty 1

## 2018-06-13 MED ORDER — TRAVASOL 10 % IV SOLN: INTRAVENOUS | Status: AC

## 2018-06-13 NOTE — Progress Notes (Signed)
PROGRESS NOTE        PATIENT DETAILS Name: [name] Age: 62 y.o. Sex: female Date of Birth: [birth year] Admit Date: 06/03/2018 Admitting Physician Karmen Bongo, MD RCB:ULAGTX, Lattie Haw, MD  Brief Narrative: Patient is a 62 y.o. female with history of rhythm, leukocytoclastic vasculitis of the skin, anxiety/depression severe abdominal pain, CT of the abdomen showed volvulus with bowel necrosis and perforation.  Patient underwent exploratory laparotomy with sigmoidectomy and colostomy on 8/7.  Hospital course complicated by mild volume overload and leukocytosis.  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 TNA being tapered off today, remains on zosyn. CT Abd on 8/14 with no residual abscess.   Leukocytosis: probably related to inflammation related to bowel surgery, no abscess seen on CT abd on 8/14. Due to perforation-remains at high risk for developing abscess, remains on empiric zosyn. She has a non toxic appearance and is not febrile.    Acute kidney injury: likely hemodynamically mediated-resolved  Volume overload: improved-with IV Lasix-still with lower ext edema-continue IV Lasix-follow weights, lytes closely  History of vasculitis involving the skin: Continue to hold-Plaquenil, methotrexate and leucovorin-given recent surgery.  Hypothyroidism: Previously on IV levothyroxine-now that diet stable-change to oral levothyroxine.  Hypertension: Previously on IV Lopressor-now that diet stable, change to oral metoprolol and follow.  Anxiety with depression: Stable-now that diet stable-resume effexoronce   Dyslipidemia: Resume statins when able.  History of cystitis: Resume elmiron in the next day or so when diet is more stable.  DVT  Prophylaxis: Prophylactic Lovenox   Code Status: Full code   Family Communication: Spouse at bedside  Disposition Plan: Remain inpatient-but will plan on Home health vs SNF on discharge and require several more days of hospital stay.  Antimicrobial agents: Anti-infectives (From admission, onward)   Start     Dose/Rate Route Frequency Ordered Stop   06/06/18 0600  cefoTEtan (CEFOTAN) 2 g in sodium chloride 0.9 % 100 mL IVPB  Status:  Discontinued     2 g 200 mL/hr over 30 Minutes Intravenous On call to O.R. 06/05/18 1608 06/05/18 1624   06/06/18 0600  cefoTEtan in Dextrose 5% (CEFOTAN) IVPB 2 g  Status:  Discontinued     2 g 100 mL/hr over 30 Minutes Intravenous 60 min pre-op 06/05/18 1625 06/10/18 0918   06/05/18 1930  piperacillin-tazobactam (ZOSYN) IVPB 3.375 g     3.375 g 12.5 mL/hr over 240 Minutes Intravenous Every 8 hours 06/05/18 1904     06/05/18 0900  piperacillin-tazobactam (ZOSYN) IVPB 3.375 g  Status:  Discontinued     3.375 g 12.5 mL/hr over 240 Minutes Intravenous Every 8 hours 06/05/18 0833 06/05/18 1904   06/03/18 1000  hydroxychloroquine (PLAQUENIL) tablet 200 mg  Status:  Discontinued     200 mg Oral 2 times daily 06/03/18 0742 06/05/18 0731   06/03/18 0730  piperacillin-tazobactam (ZOSYN) IVPB 3.375 g     3.375 g 12.5 mL/hr over 240 Minutes Intravenous  Once 06/03/18 0726 06/03/18 1246   06/03/18 0630  ciprofloxacin (CIPRO) IVPB 400 mg  Status:  Discontinued     400 mg 200 mL/hr over 60 Minutes Intravenous  Once 06/03/18 0617 06/03/18 0725  06/03/18 0630  metroNIDAZOLE (FLAGYL) IVPB 500 mg  Status:  Discontinued     500 mg 100 mL/hr over 60 Minutes Intravenous  Once 06/03/18 0617 06/03/18 0725      Procedures:  Exploratory laparotomy, sigmoid colectomy, colostomy per Drs. Ramirez/Cornett 06/05/2018  CONSULTS:  GI and general surgery  Time spent: 25 minutes-Greater than 50% of this time was spent in counseling, explanation of diagnosis, planning of  further management, and coordination of care.  MEDICATIONS: Scheduled Meds: . enoxaparin (LOVENOX) injection  40 mg Subcutaneous Daily  . feeding supplement (ENSURE ENLIVE)  237 mL Oral BID BM  . insulin aspart  0-9 Units Subcutaneous Q6H  . levothyroxine  68.5 mcg Intravenous Daily  . metoprolol tartrate  7.5 mg Intravenous Q6H  . mometasone-formoterol  2 puff Inhalation BID  . [START ON 06/14/2018] multivitamin with minerals  1 tablet Oral Daily  . pantoprazole  40 mg Oral Q0600  . sodium chloride flush  10-40 mL Intracatheter Q12H   Continuous Infusions: . methocarbamol (ROBAXIN) IV 500 mg (06/13/18 0543)  . ondansetron [facility]) IV 8 mg (06/13/18 0539)  . piperacillin-tazobactam (ZOSYN)  IV Stopped (06/13/18 0737)  . TPN ADULT (ION)     PRN Meds:.albuterol, gi cocktail, HYDROmorphone (DILAUDID) injection, LORazepam, promethazine, sodium chloride flush   PHYSICAL EXAM: Vital signs: Vitals:   06/12/18 2027 06/12/18 2131 06/13/18 0535 06/13/18 0847  BP:  (!) 145/65 (!) 144/68   Pulse: 76 83 79   Resp: 18 18 18    Temp:  99.7 F (37.6 C) 99 F (37.2 C)   TempSrc:  Oral Oral   SpO2: 100% 100% 95% 97%  Weight:      Height:       Filed Weights   06/10/18 0444 06/11/18 0427 06/12/18 0501  Weight: 91.9 kg 86.9 kg 87 kg   Body mass index is 31.92 kg/m.   General appearance:Awake, alert, not in any distress.  Eyes:no scleral icterus. HEENT: Atraumatic and Normocephalic Neck: supple, no JVD. Resp:Good air entry bilaterally,no rales or rhonchi CVS: S1 S2 regular, no murmurs.  GI: Bowel sounds present, mildly tender-dressing in place. Stool in ostomy bag Extremities: B/L Lower Ext shows ++ edema, both legs are warm to touch Neurology:  Non focal Psychiatric: Normal judgment and insight. Normal mood. Musculoskeletal:No digital cyanosis Skin:No Rash, warm and dry Wounds:N/A  I have personally reviewed following labs and imaging studies  LABORATORY DATA: CBC: Recent  Labs  Lab 06/07/18 0406 06/08/18 0408 06/09/18 0431 06/10/18 0425 06/11/18 1236 06/12/18 0359  WBC 16.7* 17.7* 18.1* 16.5* 19.0* 17.1*  NEUTROABS 14.3*  --   --  12.6*  --   --   HGB 9.5* 9.4* 10.0* 9.9* 10.8* 10.0*  HCT 29.5* 28.1* 30.0* 31.0* 33.1* 31.5*  MCV 92.8 92.1 92.6 94.8 93.0 94.3  PLT 387 392 441* 449* 534* 469*    Basic Metabolic Panel: Recent Labs  Lab 06/07/18 0406 06/08/18 0408 06/09/18 0431 06/10/18 0425 06/11/18 2018 06/12/18 0359 06/13/18 0426  NA 137 139 140 138  --  136 136  K 3.4* 3.4* 3.7 4.0  --  3.5 3.8  CL 110 112* 108 104  --  94* 99  CO2 17* 20* 24 27  --  31 29  GLUCOSE 116* 117* 129* 121*  --  119* 106*  BUN 38* 25* 18 16  --  18 14  CREATININE 1.48* 0.92 0.72 0.67  --  0.81 0.79  CALCIUM 7.9* 7.8* 7.9* 7.7*  --  7.6* 7.7*  MG 2.4 2.1 2.0 1.7 1.9  --  2.0  PHOS 2.4* 2.8 2.9 3.1  --   --  3.1    GFR: Estimated Creatinine Clearance: 78.4 mL/min (by C-G formula based on SCr of 0.79 mg/dL).  Liver Function Tests: Recent Labs  Lab 06/07/18 0406 06/10/18 0425 06/13/18 0426  AST 20 25 25   ALT 17 18 19   ALKPHOS 54 92 99  BILITOT 0.7 0.5 0.5  PROT 4.7* 5.1* 4.8*  ALBUMIN 1.9* 1.9* 1.9*   No results for input(s): LIPASE, AMYLASE in the last 168 hours. No results for input(s): AMMONIA in the last 168 hours.  Coagulation Profile: No results for input(s): INR, PROTIME in the last 168 hours.  Cardiac Enzymes: No results for input(s): CKTOTAL, CKMB, CKMBINDEX, TROPONINI in the last 168 hours.  BNP (last 3 results) No results for input(s): PROBNP in the last 8760 hours.  HbA1C: No results for input(s): HGBA1C in the last 72 hours.  CBG: Recent Labs  Lab 06/12/18 0656 06/12/18 1148 06/12/18 1752 06/12/18 2350 06/13/18 0532  GLUCAP 122* 112* 100* 99 116*    Lipid Profile: No results for input(s): CHOL, HDL, LDLCALC, TRIG, CHOLHDL, LDLDIRECT in the last 72 hours.  Thyroid Function Tests: No results for input(s): TSH,  T4TOTAL, FREET4, T3FREE, THYROIDAB in the last 72 hours.  Anemia Panel: No results for input(s): VITAMINB12, FOLATE, FERRITIN, TIBC, IRON, RETICCTPCT in the last 72 hours.  Urine analysis:    Component Value Date/Time   COLORURINE YELLOW 06/11/2018 0836   APPEARANCEUR CLEAR 06/11/2018 0836   LABSPEC 1.016 06/11/2018 0836   PHURINE 8.0 06/11/2018 0836   GLUCOSEU NEGATIVE 06/11/2018 0836   HGBUR NEGATIVE 06/11/2018 0836   BILIRUBINUR NEGATIVE 06/11/2018 0836   KETONESUR NEGATIVE 06/11/2018 0836   PROTEINUR NEGATIVE 06/11/2018 0836   NITRITE NEGATIVE 06/11/2018 0836   LEUKOCYTESUR NEGATIVE 06/11/2018 0836    Sepsis Labs: Lactic Acid, Venous    Component Value Date/Time   LATICACIDVEN 1.4 06/03/2018 0918    MICROBIOLOGY: Recent Results (from the past 240 hour(s))  Surgical pcr screen     Status: None   Collection Time: 06/05/18  4:27 PM  Result Value Ref Range Status   MRSA, PCR NEGATIVE NEGATIVE Final   Staphylococcus aureus NEGATIVE NEGATIVE Final    Comment: (NOTE) The Xpert SA Assay (FDA approved for NASAL specimens in patients 8 years of age and older), is one component of a comprehensive surveillance program. It is not intended to diagnose infection nor to guide or monitor treatment. Performed at [facility], [place] [address] [place], [place] [address]     RADIOLOGY STUDIES/RESULTS: Ct Abdomen Pelvis Wo Contrast  Result Date: 06/05/2018 CLINICAL DATA:  62 year old female with a history of abdominal pain with nausea vomiting. Constipation. EXAM: CT ABDOMEN AND PELVIS WITHOUT CONTRAST TECHNIQUE: Multidetector CT imaging of the abdomen and pelvis was performed following the standard protocol without IV contrast. COMPARISON:  06/03/2018 FINDINGS: Lower chest: Trace atelectasis and pleural fluid at the bilateral lung bases. Hepatobiliary: Cranial caudal span of the right liver measures 16.4 cm. Surgical changes of cholecystectomy. Pancreas: Unremarkable pancreas.  Spleen: Unremarkable spleen Adrenals/Urinary Tract: Bilateral adrenal glands unremarkable. There has been delayed excretion from the right kidney collecting system with mild hydronephrosis. Contrast present through the length of the proximal and mid segment of the right ureter. Left kidney demonstrates near complete evacuation/excretion of contrast from the collecting system and the left ureter. Urinary bladder demonstrates retention of some contrast. Stomach/Bowel: Gastric tube terminates  within the stomach. Stomach is decompressed. Distended loops of small bowel with associated air-fluid levels. Since the prior CT there has been significant fluid accumulation throughout the length of the colon, with distention of cecum, right colon, transverse colon and descending colon. There is similar configuration of formed stool within the sigmoid colon, with a loop of sigmoid colon and associated twisted mesentery of the low abdomen/pelvis. The distal rectum is decompressed. Crescent of air along the anterior aspect of the sigmoid colon on image 70 favored to be within the lumen of bowel. Vascular/Lymphatic: Mild atherosclerosis of the abdominal aorta. No adenopathy. Small volume of free fluid within the left pelvis, present on the comparison CT, likely reactive. Reproductive: Hysterectomy Other: Small fat containing umbilical hernia. Musculoskeletal: No acute displaced fracture. Degenerative changes of the spine. IMPRESSION: CT demonstrates sigmoid volvulus with signs of worsening bowel obstruction. Crescent of air at the anterior aspect of stool-filled sigmoid colon I would favored to be within bowel lumen, as there are no other signs of bowel perforation on the CT. These results were discussed at the time of interpretation on 06/05/2018 at 3:41 pm with Dr. Ralene Ok. Delayed excretion of contrast from the right collecting system, potentially secondary to distal inflammation or compression of the right ureter secondary  to pelvic changes. Electronically Signed   By: Corrie Mckusick D.O.   On: 06/05/2018 15:43   Ct Abdomen Pelvis W Contrast  Result Date: 06/12/2018 CLINICAL DATA:  Fever, abdominal pain, status post exploratory laparotomy with partial colectomy on 08/07 EXAM: CT ABDOMEN AND PELVIS WITH CONTRAST TECHNIQUE: Multidetector CT imaging of the abdomen and pelvis was performed using the standard protocol following bolus administration of intravenous contrast. CONTRAST:  148mL OMNIPAQUE IOHEXOL 300 MG/ML  SOLN COMPARISON:  06/05/2018 FINDINGS: Lower chest: Trace right pleural effusion. Mild patchy right lower lobe opacity, likely atelectasis. Hepatobiliary: Liver is within normal limits. Status post cholecystectomy. No intrahepatic or extrahepatic ductal dilatation. Pancreas: Within normal limits. Spleen: Within normal limits. Adrenals/Urinary Tract: Adrenal glands are within normal limits. Kidneys are within normal limits.  No hydronephrosis. Bladder is within normal limits. Stomach/Bowel: Stomach is within normal limits. 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 Mildly prominent loops of small bowel in the left mid abdomen, favoring adynamic postoperative ileus. Vascular/Lymphatic: No evidence of abdominal aortic aneurysm. Mild atherosclerotic calcifications the abdominal aorta. No suspicious abdominopelvic lymphadenopathy. Reproductive: Status post hysterectomy. No adnexal masses. Other: Tiny scattered foci of nondependent gas beneath the anterior abdominal wall (for example, series 3/images 38 and 50), postsurgical. Multiple interloop fluid collections throughout the abdomen, nonspecific given recent postoperative status. Some of these may be loculated, with mild peripheral enhancement, while many may be free-flowing. The largest collection, along the jejunal mesentery in the left mid abdomen, measures up to 7.8 cm in transverse dimension but only 2.7 cm in AP dimension  (series 3/image 42). Additional pelvic fluid with indwelling surgical drain. Musculoskeletal: Postsurgical changes along the midline anterior abdominal wall. Visualized osseous structures are within normal limits. 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 Multiple interloop fluid collections, as described above, nonspecific given recent postoperative status. If symptoms worsen (fever/leukocytosis), consider follow-up CT to assess for resolution and/or abscess development. Mildly prominent loops of small bowel in the left mid abdomen, favoring adynamic postoperative ileus. Additional ancillary findings as above. Electronically Signed   By: Julian Hy M.D.   On: 06/12/2018 10:47   Ct Abdomen Pelvis W Contrast  Result Date: 06/03/2018 CLINICAL DATA:  Lower abdominal pain x2 days with nausea and vomiting. EXAM: CT ABDOMEN AND PELVIS WITH CONTRAST TECHNIQUE: Multidetector CT imaging of the abdomen and pelvis was performed using the standard protocol following bolus administration of intravenous contrast. CONTRAST:  139mL OMNIPAQUE IOHEXOL 300 MG/ML  SOLN COMPARISON:  10/26/2016 FINDINGS: Lower chest: Normal heart size without pericardial effusion. Subsegmental atelectasis at the lung bases. Small hiatal hernia. Hepatobiliary: Cholecystectomy. No space-occupying mass of the liver. No biliary dilatation. Pancreas: Normal Spleen: Normal Adrenals/Urinary Tract: Normal bilateral adrenal glands. Symmetric cortical enhancement of both kidneys without nephrolithiasis nor obstructive uropathy. The urinary bladder is unremarkable for the degree of distention. Stomach/Bowel: The stomach is decompressed. The duodenal sweep and ligament of Treitz position is normal. Small bowel dilatation or inflammation. The distal and terminal ileum are unremarkable. The appendix is not well visualized. Increased colonic stool burden is noted within the cecum and ascending colon.  Additionally there is a large amount of stool with transmural thickening and involving the rectosigmoid suspicious for stercoral colitis. Associated small amount free fluid is seen in the pelvis. Vascular/Lymphatic: No significant vascular findings are present. No enlarged abdominal or pelvic lymph nodes. Reproductive: Status post hysterectomy. No adnexal masses. Other: Small amount of free fluid in the pelvis as described. Musculoskeletal: No acute nor suspicious osseous abnormalities. Lower lumbar facet arthropathy with mild disc flattening at L4-5 and anterolisthesis of L4 on L5. IMPRESSION: Moderate stool retention within the rectosigmoid with transmural thickening, adjacent small volume of free fluid and inflammation consistent with stercoral colitis. Electronically Signed   By: Ashley Royalty M.D.   On: 06/03/2018 03:54   US Renal  Result Date: 06/05/2018 CLINICAL DATA:  Acute kidney injury EXAM: RENAL / URINARY TRACT ULTRASOUND COMPLETE COMPARISON:  None. FINDINGS: Right Kidney: Length: 11.3 cm. Echogenicity within normal limits. No mass or hydronephrosis visualized. Left Kidney: Length: 11 cm. Echogenicity within normal limits. No mass or hydronephrosis visualized. Bladder: Appears normal for degree of bladder distention. IMPRESSION: Normal renal ultrasound. Electronically Signed   By: Kathreen Devoid   On: 06/05/2018 10:50   Dg Abd Acute W/chest  Result Date: 06/05/2018 CLINICAL DATA:  Constipation; abdominal pain EXAM: DG ABDOMEN ACUTE W/ 1V CHEST COMPARISON:  Chest radiograph Mar 01, 2011; CT abdomen and pelvis June 03, 2018 FINDINGS: PA chest: There is atelectatic change in the right base. Lungs elsewhere are clear. Heart size and pulmonary vascularity are normal. No adenopathy. Supine and upright abdomen: There is distension of the distal sigmoid colon and upper rectum due to stool. There are loops of dilated bowel with scattered air-fluid levels. No free air evident. Surgical clips are noted in the  right upper quadrant region. There are phleboliths in the pelvis. IMPRESSION: Bowel dilatation with scattered air-fluid levels. A degree of bowel obstruction is felt to be present. Note that there are loops of both small and large bowel dilatation with air-fluid levels in these areas. Question functional bowel obstruction due to relative obstruction of sigmoid colon due to stool. The appearance currently is consistent with the findings suggesting stercoral colitis distally on recent CT. No free air evident. Right base atelectasis.  Lungs elsewhere clear. Electronically Signed   By: Lowella Grip III M.D.   On: 06/05/2018 09:06   Dg Abd Portable 1v  Result Date: 06/10/2018 CLINICAL DATA:  Postoperative abdominal pain and ileus. EXAM: PORTABLE ABDOMEN - 1 VIEW COMPARISON:  CT scan and radiograph of June 05, 2018. FINDINGS: The bowel gas pattern is normal.  Surgical drain is seen in the pelvis. Status post cholecystectomy. Distal tip of nasogastric tube seen in proximal stomach. IMPRESSION: No evidence of bowel obstruction or ileus. Surgical drain is noted in the pelvis. Electronically Signed   By: Marijo Conception, M.D.   On: 06/10/2018 13:12   Dg Abd Portable 1v  Result Date: 06/05/2018 CLINICAL DATA:  Nasogastric tube position EXAM: PORTABLE ABDOMEN - 1 VIEW COMPARISON:  Portable exam 1206 hours compared to 06/05/2018 at 0827 hours FINDINGS: Nasogastric tube coiled in proximal stomach with tip near fundus. Air-filled loops of large and small bowel again identified in the upper abdomen. Surgical clips RIGHT upper quadrant. Bones demineralized. IMPRESSION: Nasogastric tube coiled in proximal stomach. Electronically Signed   By: Lavonia Dana M.D.   On: 06/05/2018 13:02   [place] Ekg Site Rite  Result Date: 06/06/2018 If Site Rite image not attached, placement could not be confirmed due to current cardiac rhythm.    LOS: 9 days   Oren Binet, MD  [facility]  If 7PM-7AM, please contact  night-coverage  Please page via [email or web address]-click on MD name and type text message  06/13/2018, 11:27 AM

## 2018-06-13 NOTE — Progress Notes (Signed)
Patient ID: [name], female   DOB: 17-Oct-1956, 62 y.o.   MRN: [id number]    8 Days Post-Op  Subjective: Patient doing well.  Tolerating full liquids quite well.  No nausea.  Ostomy working well.  Objective: Vital signs in last 24 hours: Temp:  [99 F (37.2 C)-99.7 F (37.6 C)] 99 F (37.2 C) (08/15 0535) Pulse Rate:  [73-83] 79 (08/15 0535) Resp:  [16-18] 18 (08/15 0535) BP: (144-151)/(65-77) 144/68 (08/15 0535) SpO2:  [95 %-100 %] 97 % (08/15 0847) Last BM Date: 06/12/18  Intake/Output from previous day: 08/14 0701 - 08/15 0700 In: 619.7 [P.O.:240; I.V.:197.5; IV Piggyback:182.3] Out: 2895 [Urine:2700; Stool:195] Intake/Output this shift: No intake/output data recorded.  PE: Abd: soft, midline wound necrotic tissue at the base with some mild fascial separation at the inferior base.  Ostomy working well with good output.  JP drain still with minimal output.  Lab Results:  Recent Labs    06/11/18 1236 06/12/18 0359  WBC 19.0* 17.1*  HGB 10.8* 10.0*  HCT 33.1* 31.5*  PLT 534* 469*   BMET Recent Labs    06/12/18 0359 06/13/18 0426  NA 136 136  K 3.5 3.8  CL 94* 99  CO2 31 29  GLUCOSE 119* 106*  BUN 18 14  CREATININE 0.81 0.79  CALCIUM 7.6* 7.7*   PT/INR No results for input(s): LABPROT, INR in the last 72 hours. CMP     Component Value Date/Time   NA 136 06/13/2018 0426   K 3.8 06/13/2018 0426   CL 99 06/13/2018 0426   CO2 29 06/13/2018 0426   GLUCOSE 106 (H) 06/13/2018 0426   BUN 14 06/13/2018 0426   CREATININE 0.79 06/13/2018 0426   CALCIUM 7.7 (L) 06/13/2018 0426   PROT 4.8 (L) 06/13/2018 0426   ALBUMIN 1.9 (L) 06/13/2018 0426   AST 25 06/13/2018 0426   ALT 19 06/13/2018 0426   ALKPHOS 99 06/13/2018 0426   BILITOT 0.5 06/13/2018 0426   GFRNONAA >60 06/13/2018 0426   GFRAA >60 06/13/2018 0426   Lipase     Component Value Date/Time   LIPASE 42 06/03/2018 0140       Studies/Results: Ct Abdomen Pelvis W Contrast  Result Date:  06/12/2018 CLINICAL DATA:  Fever, abdominal pain, status post exploratory laparotomy with partial colectomy on 08/07 EXAM: CT ABDOMEN AND PELVIS WITH CONTRAST TECHNIQUE: Multidetector CT imaging of the abdomen and pelvis was performed using the standard protocol following bolus administration of intravenous contrast. CONTRAST:  140mL OMNIPAQUE IOHEXOL 300 MG/ML  SOLN COMPARISON:  06/05/2018 FINDINGS: Lower chest: Trace right pleural effusion. Mild patchy right lower lobe opacity, likely atelectasis. Hepatobiliary: Liver is within normal limits. Status post cholecystectomy. No intrahepatic or extrahepatic ductal dilatation. Pancreas: Within normal limits. Spleen: Within normal limits. Adrenals/Urinary Tract: Adrenal glands are within normal limits. Kidneys are within normal limits.  No hydronephrosis. Bladder is within normal limits. Stomach/Bowel: Stomach is within normal limits. 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 Mildly prominent loops of small bowel in the left mid abdomen, favoring adynamic postoperative ileus. Vascular/Lymphatic: No evidence of abdominal aortic aneurysm. Mild atherosclerotic calcifications the abdominal aorta. No suspicious abdominopelvic lymphadenopathy. Reproductive: Status post hysterectomy. No adnexal masses. Other: Tiny scattered foci of nondependent gas beneath the anterior abdominal wall (for example, series 3/images 38 and 50), postsurgical. Multiple interloop fluid collections throughout the abdomen, nonspecific given recent postoperative status. Some of these may be loculated, with mild peripheral enhancement, while many may be  free-flowing. The largest collection, along the jejunal mesentery in the left mid abdomen, measures up to 7.8 cm in transverse dimension but only 2.7 cm in AP dimension (series 3/image 42). Additional pelvic fluid with indwelling surgical drain. Musculoskeletal: Postsurgical changes along the midline anterior  abdominal wall. Visualized osseous structures are within normal limits. 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 Multiple interloop fluid collections, as described above, nonspecific given recent postoperative status. If symptoms worsen (fever/leukocytosis), consider follow-up CT to assess for resolution and/or abscess development. Mildly prominent loops of small bowel in the left mid abdomen, favoring adynamic postoperative ileus. Additional ancillary findings as above. Electronically Signed   By: Julian Hy M.D.   On: 06/12/2018 10:47    Anti-infectives: Anti-infectives (From admission, onward)   Start     Dose/Rate Route Frequency Ordered Stop   06/06/18 0600  cefoTEtan (CEFOTAN) 2 g in sodium chloride 0.9 % 100 mL IVPB  Status:  Discontinued     2 g 200 mL/hr over 30 Minutes Intravenous On call to O.R. 06/05/18 1608 06/05/18 1624   06/06/18 0600  cefoTEtan in Dextrose 5% (CEFOTAN) IVPB 2 g  Status:  Discontinued     2 g 100 mL/hr over 30 Minutes Intravenous 60 min pre-op 06/05/18 1625 06/10/18 0918   06/05/18 1930  piperacillin-tazobactam (ZOSYN) IVPB 3.375 g     3.375 g 12.5 mL/hr over 240 Minutes Intravenous Every 8 hours 06/05/18 1904     06/05/18 0900  piperacillin-tazobactam (ZOSYN) IVPB 3.375 g  Status:  Discontinued     3.375 g 12.5 mL/hr over 240 Minutes Intravenous Every 8 hours 06/05/18 0833 06/05/18 1904   06/03/18 1000  hydroxychloroquine (PLAQUENIL) tablet 200 mg  Status:  Discontinued     200 mg Oral 2 times daily 06/03/18 0742 06/05/18 0731   06/03/18 0730  piperacillin-tazobactam (ZOSYN) IVPB 3.375 g     3.375 g 12.5 mL/hr over 240 Minutes Intravenous  Once 06/03/18 0726 06/03/18 1246   06/03/18 0630  ciprofloxacin (CIPRO) IVPB 400 mg  Status:  Discontinued     400 mg 200 mL/hr over 60 Minutes Intravenous  Once 06/03/18 0617 06/03/18 0725   06/03/18 0630  metroNIDAZOLE (FLAGYL) IVPB 500 mg  Status:   Discontinued     500 mg 100 mL/hr over 60 Minutes Intravenous  Once 06/03/18 0617 06/03/18 0725       Assessment/Plan AKI Hypothyroidism Anxiety and depression Restless leg syndrome HLD  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 Rosendo Gros - POD#8 - CT with multiple small interloop fluid collections.  Not drainable.  Cont abx therapy. -adv to soft diet today -abdominal binder to help with mild wound separation.  Cont dressing changes -Continue Zosyn -mobilize -pulm toilet - IV tylenol and robaxin -HH has been ordered for care of wound and ostomy at home. -JP in place, ? DC soon  PCM -wean TNA to off today  Hypokalemia -resolved  GYK:ZLDJ diet, TNA to off today ID: Zosyn 8/8 >>cont for now and will transition to oral abx at discharge VTE: SCD's, Lovenox  Foley:DC8/9   LOS: 9 days    Henreitta Cea , [facility] 06/13/2018, 10:06 AM Pager: [phone]

## 2018-06-13 NOTE — Progress Notes (Signed)
PT Cancellation Note  Patient Details Name: MERINDA VICTORINO MRN: [id number] DOB: 01-Aug-1956   Cancelled Treatment:    Reason Eval/Treat Not Completed: Fatigue/lethargy limiting ability to participate(pt stated she is "worn out" from dressing change, ostomy bag change, etc this morning. She stated she will walk in hall with her spouse later today. She requested PT hold today. Will follow. )   Philomena Doheny 06/13/2018, 11:17 AM [phone]

## 2018-06-13 NOTE — Consult Note (Signed)
[facility] Nurse ostomy follow up Evaluation and care completed in [facility] 6N23.  Spouse in partial way through.  Saverio Danker, PA-C to bedside for stoma assessment at my request.  It is okay for the patient to discontinue the use of the ostomy belt once the abdominal binder is placed.  She can resume use of the belt once the binder is no longer necessary. Stoma type/location: LLQ colostomy Stomal assessment/size: Retracted, sloughing, oval (1.5 inches cephalocaudal, 1 1/4 lateral) Peristomal assessment: Circumferential pink "ring" along the skin margin, not significantly changed from last assessment.  Potential mucocutaneous separation occurring except for 4 to 6 o'clock.  Difficult to determine at this time if true mucocutaneous separation, versus sloughing of stoma and retraction.  Stoma remains viable and functioning. Treatment options for stomal/peristomal skin: barrier ring. One piece convex pouch placed today.  May need to resort back to two piece flat barrier with ring if leakage occurs with the convex. Output: emptied 190ml thin green/brown effluent Ostomy pouching: 1pc. [place] Enrolled patient in [employer] Start Discharge program: Spouse states has not yet received SS package.  Will complete another form for this purpose. Val Riles, RN, MSN, CWOCN, CNS-BC, pager [phone]

## 2018-06-14 LAB — CBC
HCT: 29.5 % — ABNORMAL LOW (ref 36.0–46.0)
Hemoglobin: 9.4 g/dL — ABNORMAL LOW (ref 12.0–15.0)
MCH: 30.3 pg (ref 26.0–34.0)
MCHC: 31.9 g/dL (ref 30.0–36.0)
MCV: 95.2 fL (ref 78.0–100.0)
PLATELETS: 507 10*3/uL — AB (ref 150–400)
RBC: 3.1 MIL/uL — ABNORMAL LOW (ref 3.87–5.11)
RDW: 13.1 % (ref 11.5–15.5)
WBC: 13.5 10*3/uL — ABNORMAL HIGH (ref 4.0–10.5)

## 2018-06-14 LAB — GLUCOSE, CAPILLARY
GLUCOSE-CAPILLARY: 116 mg/dL — AB (ref 70–99)
GLUCOSE-CAPILLARY: 82 mg/dL (ref 70–99)
GLUCOSE-CAPILLARY: 87 mg/dL (ref 70–99)
GLUCOSE-CAPILLARY: 94 mg/dL (ref 70–99)

## 2018-06-14 LAB — BASIC METABOLIC PANEL
ANION GAP: 6 (ref 5–15)
BUN: 9 mg/dL (ref 8–23)
CHLORIDE: 103 mmol/L (ref 98–111)
CO2: 28 mmol/L (ref 22–32)
Calcium: 7.6 mg/dL — ABNORMAL LOW (ref 8.9–10.3)
Creatinine, Ser: 0.84 mg/dL (ref 0.44–1.00)
GFR calc non Af Amer: >60 mL/min (ref >60)
Glucose, Bld: 93 mg/dL (ref 70–99)
POTASSIUM: 3.8 mmol/L (ref 3.5–5.1)
SODIUM: 137 mmol/L (ref 135–145)

## 2018-06-14 MED ORDER — AMOXICILLIN-POT CLAVULANATE 875-125 MG PO TABS: 1.0000 | ORAL_TABLET | Freq: Two times a day (BID) | ORAL | 0 refills | Status: AC

## 2018-06-14 MED ORDER — FUROSEMIDE 10 MG/ML IJ SOLN
40.0000 mg | Freq: Every day | INTRAMUSCULAR | Status: DC
  Administered 2018-06-14: 40 mg via INTRAVENOUS
  Filled 2018-06-14: qty 4

## 2018-06-14 MED ORDER — ACETAMINOPHEN 500 MG PO TABS
1000.0000 mg | ORAL_TABLET | Freq: Four times a day (QID) | ORAL | Status: DC
  Administered 2018-06-14 – 2018-06-15 (×4): 1000 mg via ORAL
  Filled 2018-06-14 (×4): qty 2

## 2018-06-14 MED ORDER — JUVEN PO PACK
1.0000 | PACK | Freq: Two times a day (BID) | ORAL | Status: DC
  Administered 2018-06-14 – 2018-06-15 (×2): 1 via ORAL
  Filled 2018-06-14 (×4): qty 1

## 2018-06-14 MED ORDER — FUROSEMIDE 10 MG/ML IJ SOLN
40.0000 mg | Freq: Two times a day (BID) | INTRAMUSCULAR | Status: DC
  Administered 2018-06-14 – 2018-06-15 (×2): 40 mg via INTRAVENOUS
  Filled 2018-06-14 (×2): qty 4

## 2018-06-14 MED ORDER — AMOXICILLIN-POT CLAVULANATE 875-125 MG PO TABS
1.0000 | ORAL_TABLET | Freq: Two times a day (BID) | ORAL | Status: DC
Start: 2018-06-14 — End: 2018-06-15
  Administered 2018-06-14 – 2018-06-15 (×3): 1 via ORAL
  Filled 2018-06-14 (×3): qty 1

## 2018-06-14 MED ORDER — OXYCODONE HCL 5 MG PO TABS: 5.0000 mg | ORAL_TABLET | ORAL | 0 refills | Status: DC | PRN

## 2018-06-14 MED ORDER — METHOCARBAMOL 500 MG PO TABS: 500.0000 mg | ORAL_TABLET | Freq: Three times a day (TID) | ORAL | 0 refills | Status: DC | PRN

## 2018-06-14 MED ORDER — METHOCARBAMOL 500 MG PO TABS
500.0000 mg | ORAL_TABLET | Freq: Three times a day (TID) | ORAL | Status: DC
  Administered 2018-06-14 – 2018-06-15 (×4): 500 mg via ORAL
  Filled 2018-06-14 (×4): qty 1

## 2018-06-14 MED ORDER — POTASSIUM CHLORIDE CRYS ER 20 MEQ PO TBCR
40.0000 meq | EXTENDED_RELEASE_TABLET | Freq: Every day | ORAL | Status: DC
  Administered 2018-06-14 – 2018-06-15 (×2): 40 meq via ORAL
  Filled 2018-06-14 (×2): qty 2

## 2018-06-14 MED ORDER — ACETAMINOPHEN 500 MG PO TABS: 1000.0000 mg | ORAL_TABLET | Freq: Four times a day (QID) | ORAL | 0 refills | Status: AC | PRN

## 2018-06-14 MED ORDER — OXYCODONE HCL 5 MG PO TABS
5.0000 mg | ORAL_TABLET | ORAL | Status: DC | PRN
  Administered 2018-06-14: 5 mg via ORAL
  Filled 2018-06-14: qty 1

## 2018-06-14 NOTE — Discharge Instructions (Signed)
Go the day prior to your appointment next week with Jill Hurl, PA-C to have your blood work drawn  [employer], [place]  Always review your discharge instruction sheet given to you by the facility where your surgery was performed.  IF YOU HAVE DISABILITY OR FAMILY LEAVE FORMS, YOU MUST BRING THEM TO THE OFFICE FOR PROCESSING.  PLEASE DO NOT GIVE THEM TO YOUR DOCTOR.  1. A prescription for pain medication may be given to you upon discharge.  Take your pain medication as prescribed, if needed.  If narcotic pain medicine is not needed, then you may take acetaminophen (Tylenol) or ibuprofen (Advil) as needed. 2. Take your usually prescribed medications unless otherwise directed. 3. If you need a refill on your pain medication, please contact your pharmacy. They will contact our office to request authorization.  Prescriptions will not be filled after 5pm or on week-ends. 4. You should follow a light diet the first few days after arrival home, such as soup and crackers, pudding, etc.unless your doctor has advised otherwise. A high-fiber, low fat diet can be resumed as tolerated.   Be sure to include lots of fluids daily. Most patients will experience some swelling and bruising on the chest and neck area.  Ice packs will help.  Swelling and bruising can take several days to resolve 5. Most patients will experience some swelling and bruising in the area of the incision. Ice pack will help. Swelling and bruising can take several days to resolve..  6. It is common to experience some constipation if taking pain medication after surgery.  Increasing fluid intake and taking a stool softener will usually help or prevent this problem from occurring.  A mild laxative (Milk of Magnesia or Miralax) should be taken according to package directions if there are no bowel movements after 48 hours. 7.  You may have steri-strips (small skin tapes)  in place directly over the incision.  These strips should be left on the skin for 7-10 days.  If your surgeon used skin glue on the incision, you may shower in 24 hours.  The glue will flake off over the next 2-3 weeks.  Any sutures or staples will be removed at the office during your follow-up visit. You may find that a light gauze bandage over your incision may keep your staples from being rubbed or pulled. You may shower and replace the bandage daily. 8. ACTIVITIES:  You may resume regular (light) daily activities beginning the next day--such as daily self-care, walking, climbing stairs--gradually increasing activities as tolerated.  You may have sexual intercourse when it is comfortable.  Refrain from any heavy lifting or straining until approved by your doctor. a. You may drive when you no longer are taking prescription pain medication, you can comfortably wear a seatbelt, and you can safely maneuver your car and apply brakes b. Return to Work: ___________________________________ 70. You should see your doctor in the office for a follow-up appointment approximately two weeks after your surgery.  Make sure that you call for this appointment within a day or two after you arrive home to insure a convenient appointment time. OTHER INSTRUCTIONS:  _____________________________________________________________ _____________________________________________________________  WHEN TO CALL YOUR DOCTOR: 1. Fever over 101.0 2. Inability to urinate 3. Nausea and/or vomiting 4. Extreme swelling or bruising 5. Continued bleeding from incision. 6. Increased pain, redness, or drainage from the incision. 7. Difficulty swallowing or breathing 8. Muscle cramping  or spasms. 9. Numbness or tingling in hands or feet or around lips.  The clinic staff is available to answer your questions during regular business hours.  Please dont hesitate to call and ask to speak to one of the nurses if you have concerns.  For  further questions, please visit [email or web address]  Pack wound with normal saline moistened gauze and cover with dry gauze.  May remove dressing prior to showering.   Colostomy, Adult, Care After Refer to this sheet in the next few weeks. These instructions provide you with information about caring for yourself after your procedure. Your health care provider may also give you more specific instructions. Your treatment has been planned according to current medical practices, but problems sometimes occur. Call your health care provider if you have any problems or questions after your procedure. What can I expect after the procedure? After the procedure, it is common to have:  Swelling at the opening that was created during the procedure (stoma).  Slight bleeding around the stoma.  Redness around the stoma.  Follow these instructions at home: Activity  Rest as needed while the stoma area heals.  Return to your normal activities as told by your health care provider. Ask your health care provider what activities are safe for you.  Avoid strenuous activity and abdominal exercises for 3 weeks or for as long as told by your health care provider.  Do not lift anything that is heavier than 10 lb (4.5 kg). Incision care   Follow instructions from your health care provider about how to take care of your incision. Make sure you: ? Wash your hands with soap and water before you change your bandage (dressing). If soap and water are not available, use hand sanitizer. ? Change your dressing as told by your health care provider. ? Leave stitches (sutures), skin glue, or adhesive strips in place. These skin closures may need to stay in place for 2 weeks or longer. If adhesive strip edges start to loosen and curl up, you may trim the loose edges. Do not remove adhesive strips completely unless your health care provider tells you to do that. Stoma Care  Keep the stoma area clean.  Clean  and dry the skin around the stoma each time you change the colostomy bag. To clean the stoma area: ? Use warm water and only use cleansers that are recommended by your health care provider. ? Rinse the stoma area with plain water. ? Dry the area well.  Use stoma powder or ointment on your skin only as told by your health care provider. Do not use any other powders, gels, wipes, or creams on your skin.  Check the stoma area every day for signs of infection. Check for: ? More redness, swelling, or pain. ? More fluid or blood. ? Pus or warmth.  Measure the stoma opening regularly and record the size. Watch for changes. Share this information with your health care provider. Bathing  Do not take baths, swim, or use a hot tub until your health care provider approves. Ask your health care provider if you can take showers. You may be able to shower with or without the colostomy bag in place. If you bathe with the bag on, dry the bag afterward.  Avoid using harsh or oily soaps when you bathe. Colostomy Bag Care  Follow instructions from your health care provider about how to empty or change the colostomy bag.  Keep colostomy supplies with you at all times.  Store  all supplies in a cool, dry place.  Empty the colostomy bag: ? Whenever it is one-third to one-half full. ? At bedtime.  Replace the bag every 2-4 days or as told by your health care provider. Driving  Do not drive for 24 hours if you received a sedative.  Do not drive or operate heavy machinery while taking prescription pain medicine. General instructions  Follow instructions from your health care provider about eating or drinking restrictions.  Take over-the-counter and prescription medicines only as told by your health care provider.  Avoid wearing clothes that are tight directly over your stoma.  Do not use any tobacco products, such as cigarettes, chewing tobacco, and e-cigarettes. If you need help quitting, ask your  health care provider.  (Women) Ask your health care provider about becoming pregnant and about using birth control. Medicines may not be absorbed normally after the procedure.  Keep all follow-up visits as told by your health care provider. This is important. Contact a health care provider if:  You are having trouble caring for your stoma or changing the colostomy bag.  You feel nauseous or you vomit.  You have a fever.  You havemore redness, swelling, or pain at the site of your stoma or around your anus.  You have more fluid or blood coming from your stoma or your anus.  Your stoma area feels warm to the touch.  You have pus coming from your stoma.  You notice a change in the size or appearance of the stoma.  You have abdominal pain, bloating, pressure, or cramping.  Your have stool more often or less often than your health care provider tells you to expect.  You are not making much urine. This may be a sign of dehydration. Get help right away if:  Your abdominal pain does not go away or it becomes severe.  You keep vomiting.  Your stool is not draining through the stoma.  You have chest pain or an irregular heartbeat. This information is not intended to replace advice given to you by your health care provider. Make sure you discuss any questions you have with your health care provider. Document Released: 03/08/2011 Document Revised: 02/24/2016 Document Reviewed: 06/29/2015 Elsevier Interactive Patient Education  2018 [employer].

## 2018-06-14 NOTE — Consult Note (Signed)
[facility] Nurse ostomy follow up Patient remains in [place].  Ostomy pouches and barrier rings at the bedside.  Patient states she may be discharged tomorrow.  No family present. The ostomy pouch I placed yesterday remains intact, no evidence of leakage.  Stoma producing thin brown stool.  Ostomy belt in place.  Abdominal binder not present. Yesterday I sent down a second form to enroll the patient in the [facility] program; as of yesterday they had not received any supplies. No new needs identified.  Patient continues to empty her own pouch.  Spouse can perform removal and application of pouching system independently. Val Riles, RN, MSN, CWOCN, CNS-BC, pager [phone]

## 2018-06-14 NOTE — Progress Notes (Signed)
Patient ID: [name], female   DOB: [birth year], 62 y.o.   MRN: [id number]    9 Days Post-Op  Subjective: Patient had a great night's sleep last night.  Feels well today.  Tolerating solid diet, just with decrease in appetite, as expected.    Objective: Vital signs in last 24 hours: Temp:  [98.6 F (37 C)-100.3 F (37.9 C)] 98.6 F (37 C) (08/16 6283) Pulse Rate:  [56-81] 70 (08/16 0608) Resp:  [18] 18 (08/16 0608) BP: (125-141)/(56-65) 125/65 (08/16 0608) SpO2:  [97 %-100 %] 98 % (08/16 0754) Last BM Date: 06/13/18  Intake/Output from previous day: 08/15 0701 - 08/16 0700 In: 3031.2 [I.V.:911.3; IV Piggyback:2119.9] Out: 2255 [Urine:1700; Drains:5; Stool:550] Intake/Output this shift: No intake/output data recorded.  PE: Abd: soft, appropriately tender, +BS, ostomy working well despite partially necrotic stoma on medial side with some separation from skin.  The lateral side is viable and it is still patent.  Wound with necrotic base and slight increase in separation of fascia from yesterday, but still minimal.       Lab Results:  Recent Labs    06/12/18 0359 06/14/18 0444  WBC 17.1* 13.5*  HGB 10.0* 9.4*  HCT 31.5* 29.5*  PLT 469* 507*   BMET Recent Labs    06/12/18 0359 06/13/18 0426  NA 136 136  K 3.5 3.8  CL 94* 99  CO2 31 29  GLUCOSE 119* 106*  BUN 18 14  CREATININE 0.81 0.79  CALCIUM 7.6* 7.7*   PT/INR No results for input(s): LABPROT, INR in the last 72 hours. CMP     Component Value Date/Time   NA 136 06/13/2018 0426   K 3.8 06/13/2018 0426   CL 99 06/13/2018 0426   CO2 29 06/13/2018 0426   GLUCOSE 106 (H) 06/13/2018 0426   BUN 14 06/13/2018 0426   CREATININE 0.79 06/13/2018 0426   CALCIUM 7.7 (L) 06/13/2018 0426   PROT 4.8 (L) 06/13/2018 0426   ALBUMIN 1.9 (L) 06/13/2018 0426   AST 25 06/13/2018 0426   ALT 19 06/13/2018 0426   ALKPHOS 99 06/13/2018 0426   BILITOT 0.5 06/13/2018 0426   GFRNONAA >60 06/13/2018 0426   GFRAA >60  06/13/2018 0426   Lipase     Component Value Date/Time   LIPASE 42 06/03/2018 0140       Studies/Results: Ct Abdomen Pelvis W Contrast  Result Date: 06/12/2018 CLINICAL DATA:  Fever, abdominal pain, status post exploratory laparotomy with partial colectomy on 08/07 EXAM: CT ABDOMEN AND PELVIS WITH CONTRAST TECHNIQUE: Multidetector CT imaging of the abdomen and pelvis was performed using the standard protocol following bolus administration of intravenous contrast. CONTRAST:  133mL OMNIPAQUE IOHEXOL 300 MG/ML  SOLN COMPARISON:  06/05/2018 FINDINGS: Lower chest: Trace right pleural effusion. Mild patchy right lower lobe opacity, likely atelectasis. Hepatobiliary: Liver is within normal limits. Status post cholecystectomy. No intrahepatic or extrahepatic ductal dilatation. Pancreas: Within normal limits. Spleen: Within normal limits. Adrenals/Urinary Tract: Adrenal glands are within normal limits. Kidneys are within normal limits.  No hydronephrosis. Bladder is within normal limits. Stomach/Bowel: Stomach is within normal limits. 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 Mildly prominent loops of small bowel in the left mid abdomen, favoring adynamic postoperative ileus. Vascular/Lymphatic: No evidence of abdominal aortic aneurysm. Mild atherosclerotic calcifications the abdominal aorta. No suspicious abdominopelvic lymphadenopathy. Reproductive: Status post hysterectomy. No adnexal masses. Other: Tiny scattered foci of nondependent gas beneath the anterior abdominal wall (for example, series 3/images  38 and 50), postsurgical. Multiple interloop fluid collections throughout the abdomen, nonspecific given recent postoperative status. Some of these may be loculated, with mild peripheral enhancement, while many may be free-flowing. The largest collection, along the jejunal mesentery in the left mid abdomen, measures up to 7.8 cm in transverse dimension but only 2.7  cm in AP dimension (series 3/image 42). Additional pelvic fluid with indwelling surgical drain. Musculoskeletal: Postsurgical changes along the midline anterior abdominal wall. Visualized osseous structures are within normal limits. 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 Multiple interloop fluid collections, as described above, nonspecific given recent postoperative status. If symptoms worsen (fever/leukocytosis), consider follow-up CT to assess for resolution and/or abscess development. Mildly prominent loops of small bowel in the left mid abdomen, favoring adynamic postoperative ileus. Additional ancillary findings as above. Electronically Signed   By: Julian Hy M.D.   On: 06/12/2018 10:47    Anti-infectives: Anti-infectives (From admission, onward)   Start     Dose/Rate Route Frequency Ordered Stop   06/14/18 1000  amoxicillin-clavulanate (AUGMENTIN) 875-125 MG per tablet 1 tablet     1 tablet Oral Every 12 hours 06/14/18 0837     06/06/18 0600  cefoTEtan (CEFOTAN) 2 g in sodium chloride 0.9 % 100 mL IVPB  Status:  Discontinued     2 g 200 mL/hr over 30 Minutes Intravenous On call to O.R. 06/05/18 1608 06/05/18 1624   06/06/18 0600  cefoTEtan in Dextrose 5% (CEFOTAN) IVPB 2 g  Status:  Discontinued     2 g 100 mL/hr over 30 Minutes Intravenous 60 min pre-op 06/05/18 1625 06/10/18 0918   06/05/18 1930  piperacillin-tazobactam (ZOSYN) IVPB 3.375 g  Status:  Discontinued     3.375 g 12.5 mL/hr over 240 Minutes Intravenous Every 8 hours 06/05/18 1904 06/14/18 0837   06/05/18 0900  piperacillin-tazobactam (ZOSYN) IVPB 3.375 g  Status:  Discontinued     3.375 g 12.5 mL/hr over 240 Minutes Intravenous Every 8 hours 06/05/18 0833 06/05/18 1904   06/03/18 1000  hydroxychloroquine (PLAQUENIL) tablet 200 mg  Status:  Discontinued     200 mg Oral 2 times daily 06/03/18 0742 06/05/18 0731   06/03/18 0730  piperacillin-tazobactam (ZOSYN)  IVPB 3.375 g     3.375 g 12.5 mL/hr over 240 Minutes Intravenous  Once 06/03/18 0726 06/03/18 1246   06/03/18 0630  ciprofloxacin (CIPRO) IVPB 400 mg  Status:  Discontinued     400 mg 200 mL/hr over 60 Minutes Intravenous  Once 06/03/18 0617 06/03/18 0725   06/03/18 0630  metroNIDAZOLE (FLAGYL) IVPB 500 mg  Status:  Discontinued     500 mg 100 mL/hr over 60 Minutes Intravenous  Once 06/03/18 0617 06/03/18 0725       Assessment/Plan AKI Hypothyroidism Anxiety and depression Restless leg syndrome HLD  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 Rosendo Gros - POD#9 - CT with multiple small interloop fluid collections.  Not drainable.  Cont abx therapy.  Will transition to augmentin today.  She will need a total of 14 days at discharge -cont soft diet today -abdominal binder to help with mild wound separation.  Cont dressing changes -Dc JP drain today -mobilize -pulm toilet -transition to oral pain medications -HH has been ordered for care of wound and ostomy at home.   PCM -off TNA, on BID ensure  Hypokalemia -resolved  KGM:WNUU diet ID: Zosyn 8/8 >>8/16, Augmentin 8/16 VTE: SCD's, Lovenox  Foley:DC8/9  Dispo: DC IV abx therapy today and transition to oral abx.  DC JP drain today.  If does well with this, plan for surgical stability for DC home tomorrow.  She will follow up with [place] in our office in 1 week with a CBC prior to that visit.  Pending those results +/- follow up CT scan.  This has been discussed with the patient as well as discharge instructions etc.  I have written scripts for her CBC, augmentin, robaxin, and pain medications for DC home.  HH is being arranged by case management.   LOS: 10 days    Henreitta Cea , [facility] Surgery 06/14/2018, 8:38 AM Pager: [phone]

## 2018-06-14 NOTE — Progress Notes (Signed)
PROGRESS NOTE        PATIENT DETAILS Name: [name] Age: 62 y.o. Sex: female Date of Birth: May 27, 1956 Admit Date: 06/03/2018 Admitting Physician Karmen Bongo, MD [id number], Lattie Haw, MD  Brief Narrative: Patient is a 62 y.o. female with history of rhythm, leukocytoclastic vasculitis of the skin, anxiety/depression severe abdominal pain, CT of the abdomen showed volvulus with bowel necrosis and perforation.  Patient underwent exploratory laparotomy with sigmoidectomy and colostomy on 8/7.  Hospital course complicated by mild volume overload and leukocytosis.  See below for further details  Subjective: Continues to improve-denies any chest pain or shortness of breath.  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  Postop issues being managed by general surgery.  Does continue to have some mild leukocytosis but is downtrending.  CT of the abdomen on 8/14 without any residual abscess.  Previously on Zosyn, has been transition to Augmentin, diet being advanced-off TNA-General surgery planning on discharging tomorrow if clinical improvement continues.  Leukocytosis: probably related to inflammation related to bowel surgery, no abscess seen on CT abd on 8/14.  Due to perforation, she remains at high risk for developing an abscess, plans are to continue with Augmentin as noted above.  She is overall much improved-and has a complete nontoxic appearance.    Acute kidney injury: Likely hemodynamically mediated, this has resolved.   Volume overload: He needs to improve-now that all IV fluids, TNA has been discontinued-suspect volume status will improve further.  Continue IV Lasix.  Follow weights, electrolytes closely.    History of vasculitis involving the skin: Continue to hold Plaquenil, methotrexate and leucovorin-given recent surgery.    Hypothyroidism: Continue  oral levothyroxine.   Hypertension: Controlled with oral metoprolol and Lasix.   Anxiety with depression: Stable, continue with Effexor.    Dyslipidemia: Resume statins when able.  History of cystitis: Resume elmiron in the next few days.  DVT Prophylaxis: Prophylactic Lovenox   Code Status: Full code   Family Communication: None at bedside  Disposition Plan: Remain inpatient-but will plan on Home health on discharge hopefully tomorrow.  Antimicrobial agents: Anti-infectives (From admission, onward)   Start     Dose/Rate Route Frequency Ordered Stop   06/14/18 1000  amoxicillin-clavulanate (AUGMENTIN) 875-125 MG per tablet 1 tablet     1 tablet Oral Every 12 hours 06/14/18 0837     06/14/18 0000  amoxicillin-clavulanate (AUGMENTIN) 875-125 MG tablet     1 tablet Oral Every 12 hours 06/14/18 0846 06/28/18 2359   06/06/18 0600  cefoTEtan (CEFOTAN) 2 g in sodium chloride 0.9 % 100 mL IVPB  Status:  Discontinued     2 g 200 mL/hr over 30 Minutes Intravenous On call to O.R. 06/05/18 1608 06/05/18 1624   06/06/18 0600  cefoTEtan in Dextrose 5% (CEFOTAN) IVPB 2 g  Status:  Discontinued     2 g 100 mL/hr over 30 Minutes Intravenous 60 min pre-op 06/05/18 1625 06/10/18 0918   06/05/18 1930  piperacillin-tazobactam (ZOSYN) IVPB 3.375 g  Status:  Discontinued     3.375 g 12.5 mL/hr over 240 Minutes Intravenous Every 8 hours 06/05/18 1904 06/14/18 0837   06/05/18 0900  piperacillin-tazobactam (ZOSYN) IVPB 3.375 g  Status:  Discontinued     3.375 g 12.5 mL/hr over 240 Minutes Intravenous Every 8 hours 06/05/18  3149 06/05/18 1904   06/03/18 1000  hydroxychloroquine (PLAQUENIL) tablet 200 mg  Status:  Discontinued     200 mg Oral 2 times daily 06/03/18 0742 06/05/18 0731   06/03/18 0730  piperacillin-tazobactam (ZOSYN) IVPB 3.375 g     3.375 g 12.5 mL/hr over 240 Minutes Intravenous  Once 06/03/18 0726 06/03/18 1246   06/03/18 0630  ciprofloxacin (CIPRO) IVPB 400 mg  Status:   Discontinued     400 mg 200 mL/hr over 60 Minutes Intravenous  Once 06/03/18 0617 06/03/18 0725   06/03/18 0630  metroNIDAZOLE (FLAGYL) IVPB 500 mg  Status:  Discontinued     500 mg 100 mL/hr over 60 Minutes Intravenous  Once 06/03/18 0617 06/03/18 0725      Procedures:  Exploratory laparotomy, sigmoid colectomy, colostomy per Drs. Ramirez/Cornett 06/05/2018  CONSULTS:  GI and general surgery  Time spent: 25 minutes-Greater than 50% of this time was spent in counseling, explanation of diagnosis, planning of further management, and coordination of care.  MEDICATIONS: Scheduled Meds: . acetaminophen  1,000 mg Oral Q6H  . amoxicillin-clavulanate  1 tablet Oral Q12H  . enoxaparin (LOVENOX) injection  40 mg Subcutaneous Daily  . feeding supplement (ENSURE ENLIVE)  237 mL Oral BID BM  . furosemide  40 mg Intravenous Daily  . insulin aspart  0-9 Units Subcutaneous Q6H  . levothyroxine  137 mcg Oral QAC breakfast  . methocarbamol  500 mg Oral TID  . metoprolol succinate  25 mg Oral Daily  . mometasone-formoterol  2 puff Inhalation BID  . multivitamin with minerals  1 tablet Oral Daily  . pantoprazole  40 mg Oral Q0600  . pregabalin  75 mg Oral BID  . sodium chloride flush  10-40 mL Intracatheter Q12H  . venlafaxine  37.5 mg Oral BID   Continuous Infusions: . ondansetron (ZOFRAN) IV 8 mg (06/14/18 0545)   PRN Meds:.albuterol, gi cocktail, LORazepam, oxyCODONE, promethazine, sodium chloride flush   PHYSICAL EXAM: Vital signs: Vitals:   06/13/18 2007 06/13/18 2033 06/14/18 0608 06/14/18 0754  BP:  (!) 133/59 125/65   Pulse:  81 70   Resp:  18 18   Temp:  99.6 F (37.6 C) 98.6 F (37 C)   TempSrc:  Oral Oral   SpO2: 97% 99% 100% 98%  Weight:      Height:       Filed Weights   06/10/18 0444 06/11/18 0427 06/12/18 0501  Weight: 91.9 kg 86.9 kg 87 kg   Body mass index is 31.92 kg/m.   General appearance:Awake, alert, not in any distress.  Eyes:no scleral  icterus. HEENT: Atraumatic and Normocephalic Neck: supple, no JVD. Resp:Good air entry bilaterally,no rales or rhonchi CVS: S1 S2 regular, no murmurs.  GI: Bowel sounds present, Non tender-dressing in place.  Brown stools in colostomy.  Extremities: B/L Lower Ext shows + edema, both legs are warm to touch Neurology:  Non focal Psychiatric: Normal judgment and insight. Normal mood. Musculoskeletal:No digital cyanosis Skin:No Rash, warm and dry Wounds:N/A  I have personally reviewed following labs and imaging studies  LABORATORY DATA: CBC: Recent Labs  Lab 06/09/18 0431 06/10/18 0425 06/11/18 1236 06/12/18 0359 06/14/18 0444  WBC 18.1* 16.5* 19.0* 17.1* 13.5*  NEUTROABS  --  12.6*  --   --   --   HGB 10.0* 9.9* 10.8* 10.0* 9.4*  HCT 30.0* 31.0* 33.1* 31.5* 29.5*  MCV 92.6 94.8 93.0 94.3 95.2  PLT 441* 449* 534* 469* 507*    Basic Metabolic Panel: Recent  Labs  Lab 06/08/18 0408 06/09/18 0431 06/10/18 0425 06/11/18 2018 06/12/18 0359 06/13/18 0426 06/14/18 0802  NA 139 140 138  --  136 136 137  K 3.4* 3.7 4.0  --  3.5 3.8 3.8  CL 112* 108 104  --  94* 99 103  CO2 20* 24 27  --  31 29 28   GLUCOSE 117* 129* 121*  --  119* 106* 93  BUN 25* 18 16  --  18 14 9   CREATININE 0.92 0.72 0.67  --  0.81 0.79 0.84  CALCIUM 7.8* 7.9* 7.7*  --  7.6* 7.7* 7.6*  MG 2.1 2.0 1.7 1.9  --  2.0  --   PHOS 2.8 2.9 3.1  --   --  3.1  --     GFR: Estimated Creatinine Clearance: 74.7 mL/min (by C-G formula based on SCr of 0.84 mg/dL).  Liver Function Tests: Recent Labs  Lab 06/10/18 0425 06/13/18 0426  AST 25 25  ALT 18 19  ALKPHOS 92 99  BILITOT 0.5 0.5  PROT 5.1* 4.8*  ALBUMIN 1.9* 1.9*   No results for input(s): LIPASE, AMYLASE in the last 168 hours. No results for input(s): AMMONIA in the last 168 hours.  Coagulation Profile: No results for input(s): INR, PROTIME in the last 168 hours.  Cardiac Enzymes: No results for input(s): CKTOTAL, CKMB, CKMBINDEX, TROPONINI in  the last 168 hours.  BNP (last 3 results) No results for input(s): PROBNP in the last 8760 hours.  HbA1C: No results for input(s): HGBA1C in the last 72 hours.  CBG: Recent Labs  Lab 06/13/18 0532 06/13/18 1229 06/13/18 1714 06/14/18 0009 06/14/18 0742  GLUCAP 116* 97 108* 94 87    Lipid Profile: No results for input(s): CHOL, HDL, LDLCALC, TRIG, CHOLHDL, LDLDIRECT in the last 72 hours.  Thyroid Function Tests: No results for input(s): TSH, T4TOTAL, FREET4, T3FREE, THYROIDAB in the last 72 hours.  Anemia Panel: No results for input(s): VITAMINB12, FOLATE, FERRITIN, TIBC, IRON, RETICCTPCT in the last 72 hours.  Urine analysis:    Component Value Date/Time   COLORURINE YELLOW 06/11/2018 0836   APPEARANCEUR CLEAR 06/11/2018 0836   LABSPEC 1.016 06/11/2018 0836   PHURINE 8.0 06/11/2018 0836   GLUCOSEU NEGATIVE 06/11/2018 0836   HGBUR NEGATIVE 06/11/2018 0836   BILIRUBINUR NEGATIVE 06/11/2018 0836   KETONESUR NEGATIVE 06/11/2018 0836   PROTEINUR NEGATIVE 06/11/2018 0836   NITRITE NEGATIVE 06/11/2018 0836   LEUKOCYTESUR NEGATIVE 06/11/2018 0836    Sepsis Labs: Lactic Acid, Venous    Component Value Date/Time   LATICACIDVEN 1.4 06/03/2018 0918    MICROBIOLOGY: Recent Results (from the past 240 hour(s))  Surgical pcr screen     Status: None   Collection Time: 06/05/18  4:27 PM  Result Value Ref Range Status   MRSA, PCR NEGATIVE NEGATIVE Final   Staphylococcus aureus NEGATIVE NEGATIVE Final    Comment: (NOTE) The Xpert SA Assay (FDA approved for NASAL specimens in patients 12 years of age and older), is one component of a comprehensive surveillance program. It is not intended to diagnose infection nor to guide or monitor treatment. Performed at [facility], [place] [address] [place], [place] [address]     RADIOLOGY STUDIES/RESULTS: Ct Abdomen Pelvis Wo Contrast  Result Date: 06/05/2018 CLINICAL DATA:  62 year old female with a history of abdominal  pain with nausea vomiting. Constipation. EXAM: CT ABDOMEN AND PELVIS WITHOUT CONTRAST TECHNIQUE: Multidetector CT imaging of the abdomen and pelvis was performed following the standard protocol  without IV contrast. COMPARISON:  06/03/2018 FINDINGS: Lower chest: Trace atelectasis and pleural fluid at the bilateral lung bases. Hepatobiliary: Cranial caudal span of the right liver measures 16.4 cm. Surgical changes of cholecystectomy. Pancreas: Unremarkable pancreas. Spleen: Unremarkable spleen Adrenals/Urinary Tract: Bilateral adrenal glands unremarkable. There has been delayed excretion from the right kidney collecting system with mild hydronephrosis. Contrast present through the length of the proximal and mid segment of the right ureter. Left kidney demonstrates near complete evacuation/excretion of contrast from the collecting system and the left ureter. Urinary bladder demonstrates retention of some contrast. Stomach/Bowel: Gastric tube terminates within the stomach. Stomach is decompressed. Distended loops of small bowel with associated air-fluid levels. Since the prior CT there has been significant fluid accumulation throughout the length of the colon, with distention of cecum, right colon, transverse colon and descending colon. There is similar configuration of formed stool within the sigmoid colon, with a loop of sigmoid colon and associated twisted mesentery of the low abdomen/pelvis. The distal rectum is decompressed. Crescent of air along the anterior aspect of the sigmoid colon on image 70 favored to be within the lumen of bowel. Vascular/Lymphatic: Mild atherosclerosis of the abdominal aorta. No adenopathy. Small volume of free fluid within the left pelvis, present on the comparison CT, likely reactive. Reproductive: Hysterectomy Other: Small fat containing umbilical hernia. Musculoskeletal: No acute displaced fracture. Degenerative changes of the spine. IMPRESSION: CT demonstrates sigmoid volvulus with  signs of worsening bowel obstruction. Crescent of air at the anterior aspect of stool-filled sigmoid colon I would favored to be within bowel lumen, as there are no other signs of bowel perforation on the CT. These results were discussed at the time of interpretation on 06/05/2018 at 3:41 pm with Dr. Ralene Ok. Delayed excretion of contrast from the right collecting system, potentially secondary to distal inflammation or compression of the right ureter secondary to pelvic changes. Electronically Signed   By: Corrie Mckusick D.O.   On: 06/05/2018 15:43   Ct Abdomen Pelvis W Contrast  Result Date: 06/12/2018 CLINICAL DATA:  Fever, abdominal pain, status post exploratory laparotomy with partial colectomy on 08/07 EXAM: CT ABDOMEN AND PELVIS WITH CONTRAST TECHNIQUE: Multidetector CT imaging of the abdomen and pelvis was performed using the standard protocol following bolus administration of intravenous contrast. CONTRAST:  129mL OMNIPAQUE IOHEXOL 300 MG/ML  SOLN COMPARISON:  06/05/2018 FINDINGS: Lower chest: Trace right pleural effusion. Mild patchy right lower lobe opacity, likely atelectasis. Hepatobiliary: Liver is within normal limits. Status post cholecystectomy. No intrahepatic or extrahepatic ductal dilatation. Pancreas: Within normal limits. Spleen: Within normal limits. Adrenals/Urinary Tract: Adrenal glands are within normal limits. Kidneys are within normal limits.  No hydronephrosis. Bladder is within normal limits. Stomach/Bowel: Stomach is within normal limits. 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 Mildly prominent loops of small bowel in the left mid abdomen, favoring adynamic postoperative ileus. Vascular/Lymphatic: No evidence of abdominal aortic aneurysm. Mild atherosclerotic calcifications the abdominal aorta. No suspicious abdominopelvic lymphadenopathy. Reproductive: Status post hysterectomy. No adnexal masses. Other: Tiny scattered foci of  nondependent gas beneath the anterior abdominal wall (for example, series 3/images 38 and 50), postsurgical. Multiple interloop fluid collections throughout the abdomen, nonspecific given recent postoperative status. Some of these may be loculated, with mild peripheral enhancement, while many may be free-flowing. The largest collection, along the jejunal mesentery in the left mid abdomen, measures up to 7.8 cm in transverse dimension but only 2.7 cm in AP dimension (series 3/image 42). Additional pelvic  fluid with indwelling surgical drain. Musculoskeletal: Postsurgical changes along the midline anterior abdominal wall. Visualized osseous structures are within normal limits. 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 Multiple interloop fluid collections, as described above, nonspecific given recent postoperative status. If symptoms worsen (fever/leukocytosis), consider follow-up CT to assess for resolution and/or abscess development. Mildly prominent loops of small bowel in the left mid abdomen, favoring adynamic postoperative ileus. Additional ancillary findings as above. Electronically Signed   By: Julian Hy M.D.   On: 06/12/2018 10:47   Ct Abdomen Pelvis W Contrast  Result Date: 06/03/2018 CLINICAL DATA:  Lower abdominal pain x2 days with nausea and vomiting. EXAM: CT ABDOMEN AND PELVIS WITH CONTRAST TECHNIQUE: Multidetector CT imaging of the abdomen and pelvis was performed using the standard protocol following bolus administration of intravenous contrast. CONTRAST:  163mL OMNIPAQUE IOHEXOL 300 MG/ML  SOLN COMPARISON:  10/26/2016 FINDINGS: Lower chest: Normal heart size without pericardial effusion. Subsegmental atelectasis at the lung bases. Small hiatal hernia. Hepatobiliary: Cholecystectomy. No space-occupying mass of the liver. No biliary dilatation. Pancreas: Normal Spleen: Normal Adrenals/Urinary Tract: Normal bilateral adrenal glands.  Symmetric cortical enhancement of both kidneys without nephrolithiasis nor obstructive uropathy. The urinary bladder is unremarkable for the degree of distention. Stomach/Bowel: The stomach is decompressed. The duodenal sweep and ligament of Treitz position is normal. Small bowel dilatation or inflammation. The distal and terminal ileum are unremarkable. The appendix is not well visualized. Increased colonic stool burden is noted within the cecum and ascending colon. Additionally there is a large amount of stool with transmural thickening and involving the rectosigmoid suspicious for stercoral colitis. Associated small amount free fluid is seen in the pelvis. Vascular/Lymphatic: No significant vascular findings are present. No enlarged abdominal or pelvic lymph nodes. Reproductive: Status post hysterectomy. No adnexal masses. Other: Small amount of free fluid in the pelvis as described. Musculoskeletal: No acute nor suspicious osseous abnormalities. Lower lumbar facet arthropathy with mild disc flattening at L4-5 and anterolisthesis of L4 on L5. IMPRESSION: Moderate stool retention within the rectosigmoid with transmural thickening, adjacent small volume of free fluid and inflammation consistent with stercoral colitis. Electronically Signed   By: Ashley Royalty M.D.   On: 06/03/2018 03:54   US Renal  Result Date: 06/05/2018 CLINICAL DATA:  Acute kidney injury EXAM: RENAL / URINARY TRACT ULTRASOUND COMPLETE COMPARISON:  None. FINDINGS: Right Kidney: Length: 11.3 cm. Echogenicity within normal limits. No mass or hydronephrosis visualized. Left Kidney: Length: 11 cm. Echogenicity within normal limits. No mass or hydronephrosis visualized. Bladder: Appears normal for degree of bladder distention. IMPRESSION: Normal renal ultrasound. Electronically Signed   By: Kathreen Devoid   On: 06/05/2018 10:50   Dg Abd Acute W/chest  Result Date: 06/05/2018 CLINICAL DATA:  Constipation; abdominal pain EXAM: DG ABDOMEN ACUTE W/ 1V  CHEST COMPARISON:  Chest radiograph Mar 01, 2011; CT abdomen and pelvis June 03, 2018 FINDINGS: PA chest: There is atelectatic change in the right base. Lungs elsewhere are clear. Heart size and pulmonary vascularity are normal. No adenopathy. Supine and upright abdomen: There is distension of the distal sigmoid colon and upper rectum due to stool. There are loops of dilated bowel with scattered air-fluid levels. No free air evident. Surgical clips are noted in the right upper quadrant region. There are phleboliths in the pelvis. IMPRESSION: Bowel dilatation with scattered air-fluid levels. A degree of bowel obstruction is felt to be present. Note that there are loops of both small and large bowel dilatation  with air-fluid levels in these areas. Question functional bowel obstruction due to relative obstruction of sigmoid colon due to stool. The appearance currently is consistent with the findings suggesting stercoral colitis distally on recent CT. No free air evident. Right base atelectasis.  Lungs elsewhere clear. Electronically Signed   By: Lowella Grip III M.D.   On: 06/05/2018 09:06   Dg Abd Portable 1v  Result Date: 06/10/2018 CLINICAL DATA:  Postoperative abdominal pain and ileus. EXAM: PORTABLE ABDOMEN - 1 VIEW COMPARISON:  CT scan and radiograph of June 05, 2018. FINDINGS: The bowel gas pattern is normal. Surgical drain is seen in the pelvis. Status post cholecystectomy. Distal tip of nasogastric tube seen in proximal stomach. IMPRESSION: No evidence of bowel obstruction or ileus. Surgical drain is noted in the pelvis. Electronically Signed   By: Marijo Conception, M.D.   On: 06/10/2018 13:12   Dg Abd Portable 1v  Result Date: 06/05/2018 CLINICAL DATA:  Nasogastric tube position EXAM: PORTABLE ABDOMEN - 1 VIEW COMPARISON:  Portable exam 1206 hours compared to 06/05/2018 at 0827 hours FINDINGS: Nasogastric tube coiled in proximal stomach with tip near fundus. Air-filled loops of large and small  bowel again identified in the upper abdomen. Surgical clips RIGHT upper quadrant. Bones demineralized. IMPRESSION: Nasogastric tube coiled in proximal stomach. Electronically Signed   By: Lavonia Dana M.D.   On: 06/05/2018 13:02   [place] Ekg Site Rite  Result Date: 06/06/2018 If Site Rite image not attached, placement could not be confirmed due to current cardiac rhythm.    LOS: 10 days   Oren Binet, MD  [facility]  If 7PM-7AM, please contact night-coverage  Please page via [email or web address]-click on MD name and type text message  06/14/2018, 11:39 AM

## 2018-06-14 NOTE — Progress Notes (Signed)
Physical Therapy Treatment Patient Details Name: [name] MRN: [id number] DOB: 05-05-1956 Today's Date: 06/14/2018    History of Present Illness Pt is a 62 y/o female admitted secondary to abdominal pain. Pt is s/p exploratory laparotomy, sigmoid colectomy, and colostomy. PMH including but not limited to fibromyalgia.    PT Comments    Pt making steady progress with functional mobility and was able to ambulate in hallway without an AD this session. However, pt with two minor LOB requiring min A to maintain upright. Plan is for pt to d/c home potentially tomorrow with her husband.   Pt would continue to benefit from skilled physical therapy services at this time while admitted and after d/c to address the below listed limitations in order to improve overall safety and independence with functional mobility.    Follow Up Recommendations  Home health PT     Equipment Recommendations  Rolling walker with 5" wheels    Recommendations for Other Services       Precautions / Restrictions Precautions Precautions: Fall Precaution Comments: colostomy, drain, abdominal Restrictions Weight Bearing Restrictions: No    Mobility  Bed Mobility               General bed mobility comments: pt OOB in recliner chair upon arrival  Transfers Overall transfer level: Needs assistance Equipment used: None Transfers: Sit to/from Stand Sit to Stand: Supervision         General transfer comment: supervision for safety  Ambulation/Gait Ambulation/Gait assistance: Min guard;Min assist Gait Distance (Feet): 200 Feet Assistive device: None Gait Pattern/deviations: Step-through pattern;Decreased stride length;Drifts right/left Gait velocity: decreased Gait velocity interpretation: <1.8 ft/sec, indicate of risk for recurrent falls General Gait Details: pt with slow, steady gait pattern, minor LOB x2 with directional changes requiring min A to maintain upright standing balance; pt with  greatly improved upright posture without use of an AD   Stairs             Wheelchair Mobility    Modified Rankin (Stroke Patients Only)       Balance Overall balance assessment: Needs assistance Sitting-balance support: Feet supported;No upper extremity supported Sitting balance-Leahy Scale: Good     Standing balance support: No upper extremity supported Standing balance-Leahy Scale: Fair                              Cognition Arousal/Alertness: Awake/alert Behavior During Therapy: WFL for tasks assessed/performed Overall Cognitive Status: Within Functional Limits for tasks assessed                                        Exercises      General Comments        Pertinent Vitals/Pain Pain Assessment: Faces Faces Pain Scale: Hurts a little bit Pain Location: abdomen Pain Descriptors / Indicators: Sore Pain Intervention(s): Monitored during session;Repositioned    Home Living                      Prior Function            PT Goals (current goals can now be found in the care plan section) Acute Rehab PT Goals PT Goal Formulation: With patient Time For Goal Achievement: 06/22/18 Potential to Achieve Goals: Good Progress towards PT goals: Progressing toward goals    Frequency    Min  3X/week      PT Plan Current plan remains appropriate    Co-evaluation              AM-PAC PT "6 Clicks" Daily Activity  Outcome Measure  Difficulty turning over in bed (including adjusting bedclothes, sheets and blankets)?: A Little Difficulty moving from lying on back to sitting on the side of the bed? : A Little Difficulty sitting down on and standing up from a chair with arms (e.g., wheelchair, bedside commode, etc,.)?: None Help needed moving to and from a bed to chair (including a wheelchair)?: None Help needed walking in hospital room?: A Little Help needed climbing 3-5 steps with a railing? : A Little 6 Click  Score: 20    End of Session   Activity Tolerance: Patient tolerated treatment well Patient left: in chair;with call bell/phone within reach;with family/visitor present Nurse Communication: Mobility status PT Visit Diagnosis: Other abnormalities of gait and mobility (R26.89);Pain Pain - part of body: (abdomen)     Time: 1447-1500 PT Time Calculation (min) (ACUTE ONLY): 13 min  Charges:  $Gait Training: 8-22 mins                     [place], [place], [place] Lucas 06/14/2018, 3:54 PM

## 2018-06-14 NOTE — Progress Notes (Signed)
Nutrition Follow-up  DOCUMENTATION CODES:   Obesity unspecified  INTERVENTION:   -Continue MVI with minerals daily -D/c Ensure Enlive po BID, each supplement provides 350 kcal and 20 grams of protein, due to poor acceptance -1 packet Juven BID, each packet provides 80 calories, 8 grams of carbohydrate, and 14 grams of amino acids; supplement contains CaHMB, glutamine, and arginine, to promote wound healing  NUTRITION DIAGNOSIS:   Increased nutrient needs related to post-op healing, wound healing as evidenced by estimated needs.  Ongoing  GOAL:   Patient will meet greater than or equal to 90% of their needs  Progressing  MONITOR:   PO intake, Supplement acceptance, Diet advancement, Labs, Weight trends, Skin, I & O's  REASON FOR ASSESSMENT:   Consult New TPN/TNA  ASSESSMENT:   [name] is a 62 y.o. female with a Past Medical History of uterine CA; RLS; PAT; hypothyroidism; fibromyalgia/chronic fatigue (not on chronic opiates); and leukocytoclastic vasculitis who presents with severe abdominal pain x 2 days with n/v.    8/7- NGT placed; CT revealedsigmoid volvulus; s/p ex-lap. Sigmoid colectomy with colostomy 8/8- PICC placed 8/9- TPN initiated 8/13- NGT removed, advanced to clear liquid diet 8/15- TPN d/c  Per general surgery notes, plan to d/c JP drain today with likely discharge tomorrow (06/15/18).   Pt sleeping soundly at time of visit and did not arouse to voice. Pt was advanced to a soft diet on 06/13/18. Pt with fair appetite; meal completion 50%. Observed breakfast tray oin table- pt consumed 50% of toast with butter and 60^ of scrambled eggs this morning. She is refusing Ensure supplements.   Labs reviewed: CBGS: 87-108 (inpatient orders for glycemic control are 0-9 units insulin aspart every 6 hours).  Diet Order:   Diet Order            DIET SOFT Room service appropriate? Yes; Fluid consistency: Thin  Diet effective now              EDUCATION  NEEDS:   Education needs have been addressed  Skin:  Skin Assessment: Skin Integrity Issues: Skin Integrity Issues:: Incisions Incisions: closed abdominal incision  Last BM:  06/11/18 (50 ml output via colostomy)  Height:   Ht Readings from Last 1 Encounters:  06/03/18 5\' 5"  (1.651 m)    Weight:   Wt Readings from Last 1 Encounters:  06/12/18 87 kg    Ideal Body Weight:  56.8 kg  BMI:  Body mass index is 31.92 kg/m.  Estimated Nutritional Needs:   Kcal:  1800-2000  Protein:  115-130 grams  Fluid:  > 1.8 L    Damone Fancher A. Jimmye Norman, RD, LDN, CDE Pager: [phone] After hours Pager: [phone]

## 2018-06-15 DIAGNOSIS — D72829 Elevated white blood cell count, unspecified: Secondary | ICD-10-CM

## 2018-06-15 LAB — BASIC METABOLIC PANEL
ANION GAP: 7 (ref 5–15)
BUN: 9 mg/dL (ref 8–23)
CHLORIDE: 105 mmol/L (ref 98–111)
CO2: 27 mmol/L (ref 22–32)
CREATININE: 0.78 mg/dL (ref 0.44–1.00)
Calcium: 7.5 mg/dL — ABNORMAL LOW (ref 8.9–10.3)
GFR calc non Af Amer: >60 mL/min (ref >60)
Glucose, Bld: 99 mg/dL (ref 70–99)
POTASSIUM: 3.7 mmol/L (ref 3.5–5.1)
Sodium: 139 mmol/L (ref 135–145)

## 2018-06-15 LAB — GLUCOSE, CAPILLARY
Glucose-Capillary: 109 mg/dL — ABNORMAL HIGH (ref 70–99)
Glucose-Capillary: 76 mg/dL (ref 70–99)

## 2018-06-15 MED ORDER — POTASSIUM CHLORIDE CRYS ER 20 MEQ PO TBCR: 20.0000 meq | EXTENDED_RELEASE_TABLET | Freq: Every day | ORAL | 0 refills | Status: DC

## 2018-06-15 MED ORDER — FUROSEMIDE 40 MG PO TABS: 40.0000 mg | ORAL_TABLET | Freq: Every day | ORAL | 0 refills | Status: DC

## 2018-06-15 NOTE — Progress Notes (Signed)
Nicci A Montoya to be D/C'd  per MD order. Discussed with the patient and all questions fully answered.  VSS, Skin clean, dry and intact without evidence of skin break down, no evidence of skin tears noted.  IV catheter discontinued intact. Site without signs and symptoms of complications. Dressing and pressure applied.  An After Visit Summary was printed and given to the patient. Patient received prescription.  D/c education completed with patient/family including follow up instructions, medication list, d/c activities limitations if indicated, with other d/c instructions as indicated by MD - patient able to verbalize understanding, all questions fully answered.   Patient instructed to return to ED, call 911, or call MD for any changes in condition.   Patient to be escorted via WC, and D/C home via private auto. 

## 2018-06-15 NOTE — Discharge Summary (Signed)
PATIENT DETAILS Name: [name] Age: 62 y.o. Sex: female Date of Birth: 06-06-1956 MRN: [id number]. Admitting Physician: Karmen Bongo, MD QQI:[id number], Lattie Haw, MD  Admit Date: 06/03/2018 Discharge date: 06/15/2018  Recommendations for Outpatient Follow-up:  1. Follow up with PCP in 1-2 weeks 2. Please obtain BMP/CBC in one week 3. Please ensure follow-up with general surgery 4. Methotrexate/Plaquenil currently on hold due to postop issues/high risk of infection-please resume in the next week or so if she continues to do well.  Admitted From:  Home  Disposition: Home with home health services   Home Health:  Yes  Equipment/Devices: None  Discharge Condition: Stable  CODE STATUS: DNR  Diet recommendation:  Heart Healthy  Brief Summary: See H&P, Labs, Consult and Test reports for all details in brief,Patient is a 62 y.o. female with history of rhythm, leukocytoclastic vasculitis of the skin, anxiety/depression severe abdominal pain, CT of the abdomen showed volvulus with bowel necrosis and perforation.  Patient underwent exploratory laparotomy with sigmoidectomy and colostomy on 8/7.  Hospital course complicated by mild volume overload and leukocytosis.  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  Postop issues  managed by general surgery.    Did have some mild leukocytosis that has since down trended, CT of the  abdomen on 8/14 without any residual abscess.  Previously on Zosyn, has been transition to Augmentin-continue for 2 additional weeks per general surgery.  Diet advanced-no longer on TNA and tolerating diet.  Please ensure outpatient follow-up with general surgery.  Leukocytosis: probably related to inflammation related to bowel surgery, no abscess seen on CT abd on 8/14.  Due to perforation, she remains at high risk for  developing an abscess, plans are to continue with Augmentin as noted above.  She is overall much improved-and has a complete nontoxic appearance.    Acute kidney injury: Likely hemodynamically mediated, this has resolved.   Volume overload:  Acne frequently improved-this was secondary to IV fluid resuscitation.  Markedly better volume status after IV Lasix.  She still has some mild lower extremity edema-we will continue Lasix and potassium supplementation for 5 additional days on discharge.  Please follow-up with PCP and reassess volume status accordingly.  Please repeat electrolytes within 1 week.  History of vasculitis involving the skin: Continue to hold Plaquenil, methotrexate and leucovorin-given recent surgery/stool contamination of the peritoneum-if she continues to do well-suspect these agents could be resumed in the next week or so.   Hypothyroidism: Continue oral levothyroxine.   Hypertension: Controlled with oral metoprolol and Lasix.   Anxiety with depression: Stable, continue with Effexor.    Dyslipidemia: Resume statins on discharge  History of interstitial cystitis: Resume elmiron  on discharge  Procedures/Studies:  Exploratory laparotomy, sigmoid colectomy, colostomy per Drs. Ramirez/Cornett 06/05/2018  Discharge Diagnoses:  Principal Problem:   Sigmoid volvulus ([place]) Active Problems:   Fecal impaction ([facility])   Enterocolitis   Asthma   Chronic fatigue syndrome   Hypothyroidism   Pernicious anemia   Vasculitis of skin   Leukocytosis (leucocytosis)   Bowel perforation ([facility])   Anemia   Hypokalemia   Hypophosphatemia   AKI (acute kidney injury) [facility])   Discharge Instructions:  Activity:  As tolerated with Full fall precautions use walker/cane & assistance as needed   Discharge Instructions    Call MD for:  redness, tenderness, or signs of infection (pain, swelling, redness, odor or  green/yellow discharge around incision site)   Complete by:  As  directed    Diet - low sodium heart healthy   Complete by:  As directed    Discharge instructions   Complete by:  As directed    Follow with Primary MD  Kathyrn Lass, MD IN 1 WEEK  Follow with general surgery as instructed  Please get a complete blood count and chemistry panel checked by your Primary MD at your next visit, and again as instructed by your Primary MD.  Get Medicines reviewed and adjusted: Please take all your medications with you for your next visit with your Primary MD  Laboratory/radiological data: Please request your Primary MD to go over all hospital tests and procedure/radiological results at the follow up, please ask your Primary MD to get all Hospital records sent to his/her office.  In some cases, they will be blood work, cultures and biopsy results pending at the time of your discharge. Please request that your primary care M.D. follows up on these results.  Also Note the following: If you experience worsening of your admission symptoms, develop shortness of breath, life threatening emergency, suicidal or homicidal thoughts you must seek medical attention immediately by calling 911 or calling your MD immediately  if symptoms less severe.  You must read complete instructions/literature along with all the possible adverse reactions/side effects for all the Medicines you take and that have been prescribed to you. Take any new Medicines after you have completely understood and accpet all the possible adverse reactions/side effects.   Do not drive when taking Pain medications or sleeping medications (Benzodaizepines)  Do not take more than prescribed Pain, Sleep and Anxiety Medications. It is not advisable to combine anxiety,sleep and pain medications without talking with your primary care practitioner  Special Instructions: If you have smoked or chewed Tobacco  in the last 2 yrs please stop smoking, stop any regular Alcohol  and or any Recreational drug use.  Wear  Seat belts while driving.  Please note: You were cared for by a hospitalist during your hospital stay. Once you are discharged, your primary care physician will handle any further medical issues. Please note that NO REFILLS for any discharge medications will be authorized once you are discharged, as it is imperative that you return to your primary care physician (or establish a relationship with a primary care physician if you do not have one) for your post hospital discharge needs so that they can reassess your need for medications and monitor your lab values.   Increase activity slowly   Complete by:  As directed      Allergies as of 06/15/2018      Reactions   Ambien [zolpidem Tartrate] Other (See Comments)   Hallucinations   Bee Venom Itching, Shortness Of Breath, Swelling   Minocycline    Rash   Peanut-containing Drug Products Anaphylaxis   Tape Itching, Rash, Swelling   Ciprofloxacin Nausea And Vomiting   Latex Rash   Shellfish Allergy Rash      Medication List    STOP taking these medications   doxycycline 20 MG tablet Commonly known as:  PERIOSTAT   hydroxychloroquine 200 MG tablet Commonly known as:  PLAQUENIL   leucovorin 5 MG tablet Commonly known as:  WELLCOVORIN   Methotrexate (PF) 25 MG/0.5ML Soaj     TAKE these medications   acetaminophen 500 MG tablet Commonly known as:  TYLENOL Take 2 tablets (1,000 mg total) by mouth every 6 (six) hours as  needed.   albuterol 108 (90 Base) MCG/ACT inhaler Commonly known as:  PROVENTIL HFA;VENTOLIN HFA Inhale 2 puffs into the lungs every 4 (four) hours as needed for wheezing or shortness of breath.   ALPRAZolam 1 MG tablet Commonly known as:  XANAX Take 0.5-1 mg by mouth at bedtime as needed for anxiety or sleep.   amoxicillin-clavulanate 875-125 MG tablet Commonly known as:  AUGMENTIN Take 1 tablet by mouth every 12 (twelve) hours for 14 days.   aspirin EC 81 MG tablet Take 1 tablet (81 mg total) by mouth  daily.   atorvastatin 10 MG tablet Commonly known as:  LIPITOR Take 1 tablet (10 mg total) by mouth daily.   clindamycin 1 % gel Commonly known as:  CLINDAGEL Apply 1 application topically daily.   dexlansoprazole 60 MG capsule Commonly known as:  DEXILANT Take 60 mg by mouth daily.   diclofenac sodium 1 % Gel Commonly known as:  VOLTAREN Apply 2 g topically 3 (three) times daily as needed (pain).   DULoxetine 30 MG capsule Commonly known as:  CYMBALTA Take 90 mg by mouth daily.   EPINEPHrine 0.3 mg/0.3 mL Soaj injection Commonly known as:  EPI-PEN Inject 0.3 mg into the muscle daily as needed (allergic reaction).   fexofenadine 180 MG tablet Commonly known as:  ALLEGRA Take 180 mg by mouth daily.   fluticasone-salmeterol 115-21 MCG/ACT inhaler Commonly known as:  ADVAIR HFA Inhale 2 puffs into the lungs 2 (two) times daily as needed ((ASTHMA)).   furosemide 40 MG tablet Commonly known as:  LASIX Take 1 tablet (40 mg total) by mouth daily.   levothyroxine 150 MCG tablet Commonly known as:  SYNTHROID, LEVOTHROID Take 137 mcg by mouth daily before breakfast.   LINZESS 145 MCG Caps capsule Generic drug:  linaclotide Take 145 mcg by mouth daily as needed (constipation).   LYRICA 75 MG capsule Generic drug:  pregabalin Take 75 mg by mouth 2 (two) times daily.   meloxicam 15 MG tablet Commonly known as:  MOBIC Take 15 mg by mouth daily.   methocarbamol 500 MG tablet Commonly known as:  ROBAXIN Take 1 tablet (500 mg total) by mouth every 8 (eight) hours as needed for muscle spasms.   metoprolol succinate 25 MG 24 hr tablet Commonly known as:  TOPROL-XL Take 1 tablet (25 mg total) by mouth daily.   METRONIDAZOLE EX Apply 1 application topically 2 (two) times daily.   nitroGLYCERIN 0.4 MG SL tablet Commonly known as:  NITROSTAT Place 1 tablet (0.4 mg total) under the tongue every 5 (five) minutes as needed for chest pain.   ondansetron 8 MG tablet Commonly  known as:  ZOFRAN Take 8 mg by mouth every 8 (eight) hours as needed for nausea or vomiting.   oxyCODONE 5 MG immediate release tablet Commonly known as:  Oxy IR/ROXICODONE Take 1 tablet (5 mg total) by mouth every 4 (four) hours as needed for moderate pain.   pentosan polysulfate 100 MG capsule Commonly known as:  ELMIRON Take 100 mg by mouth 2 (two) times daily.   PERIDIN-C PO Take 2 tablets by mouth 3 (three) times daily.   potassium chloride SA 20 MEQ tablet Commonly known as:  K-DUR,KLOR-CON Take 1 tablet (20 mEq total) by mouth daily.   venlafaxine 37.5 MG tablet Commonly known as:  EFFEXOR Take 37.5 mg by mouth 2 (two) times daily.   vitamin B-12 1000 MCG tablet Commonly known as:  CYANOCOBALAMIN Take 1,000 mcg by mouth daily.  Vitamin D3 1000 units Caps Take 2,000 Units by mouth daily.            Durable Medical Equipment  (From admission, onward)         Start     Ordered   06/10/18 1732  For home use only DME Walker rolling  Once    Question:  Patient needs a walker to treat with the following condition  Answer:  Debility   06/10/18 1731         Follow-up Information    Carlena Hurl, PA-C Follow up on 06/21/2018.   Specialty:  General Surgery Why:  11:45am, arrive by 11:15am for paperwork and checkin Contact information: [address] [place] [address] [phone]        Ralene Ok, MD Follow up in 3 week(s).   Specialty:  General Surgery Why:  set this appointment up after you see Puja in the office Contact information: [place] [address] [phone]        Kathyrn Lass, MD. Schedule an appointment as soon as possible for a visit in 1 week(s).   Specialty:  Family Medicine Contact information: [place] [address] [phone]          Allergies  Allergen Reactions  . Ambien [Zolpidem Tartrate] Other (See Comments)    Hallucinations  . Bee Venom Itching,  Shortness Of Breath and Swelling  . Minocycline     Rash  . Peanut-Containing Drug Products Anaphylaxis  . Tape Itching, Rash and Swelling  . Ciprofloxacin Nausea And Vomiting  . Latex Rash  . Shellfish Allergy Rash   Consultations:   general surgery  Other Procedures/Studies: Ct Abdomen Pelvis Wo Contrast  Result Date: 06/05/2018 CLINICAL DATA:  62 year old female with a history of abdominal pain with nausea vomiting. Constipation. EXAM: CT ABDOMEN AND PELVIS WITHOUT CONTRAST TECHNIQUE: Multidetector CT imaging of the abdomen and pelvis was performed following the standard protocol without IV contrast. COMPARISON:  06/03/2018 FINDINGS: Lower chest: Trace atelectasis and pleural fluid at the bilateral lung bases. Hepatobiliary: Cranial caudal span of the right liver measures 16.4 cm. Surgical changes of cholecystectomy. Pancreas: Unremarkable pancreas. Spleen: Unremarkable spleen Adrenals/Urinary Tract: Bilateral adrenal glands unremarkable. There has been delayed excretion from the right kidney collecting system with mild hydronephrosis. Contrast present through the length of the proximal and mid segment of the right ureter. Left kidney demonstrates near complete evacuation/excretion of contrast from the collecting system and the left ureter. Urinary bladder demonstrates retention of some contrast. Stomach/Bowel: Gastric tube terminates within the stomach. Stomach is decompressed. Distended loops of small bowel with associated air-fluid levels. Since the prior CT there has been significant fluid accumulation throughout the length of the colon, with distention of cecum, right colon, transverse colon and descending colon. There is similar configuration of formed stool within the sigmoid colon, with a loop of sigmoid colon and associated twisted mesentery of the low abdomen/pelvis. The distal rectum is decompressed. Crescent of air along the anterior aspect of the sigmoid colon on image 70 favored to be  within the lumen of bowel. Vascular/Lymphatic: Mild atherosclerosis of the abdominal aorta. No adenopathy. Small volume of free fluid within the left pelvis, present on the comparison CT, likely reactive. Reproductive: Hysterectomy Other: Small fat containing umbilical hernia. Musculoskeletal: No acute displaced fracture. Degenerative changes of the spine. IMPRESSION: CT demonstrates sigmoid volvulus with signs of worsening bowel obstruction. Crescent of air at the anterior aspect of  stool-filled sigmoid colon I would favored to be within bowel lumen, as there are no other signs of bowel perforation on the CT. These results were discussed at the time of interpretation on 06/05/2018 at 3:41 pm with Dr. Ralene Ok. Delayed excretion of contrast from the right collecting system, potentially secondary to distal inflammation or compression of the right ureter secondary to pelvic changes. Electronically Signed   By: Corrie Mckusick D.O.   On: 06/05/2018 15:43   Ct Abdomen Pelvis W Contrast  Result Date: 06/12/2018 CLINICAL DATA:  Fever, abdominal pain, status post exploratory laparotomy with partial colectomy on 08/07 EXAM: CT ABDOMEN AND PELVIS WITH CONTRAST TECHNIQUE: Multidetector CT imaging of the abdomen and pelvis was performed using the standard protocol following bolus administration of intravenous contrast. CONTRAST:  110mL OMNIPAQUE IOHEXOL 300 MG/ML  SOLN COMPARISON:  06/05/2018 FINDINGS: Lower chest: Trace right pleural effusion. Mild patchy right lower lobe opacity, likely atelectasis. Hepatobiliary: Liver is within normal limits. Status post cholecystectomy. No intrahepatic or extrahepatic ductal dilatation. Pancreas: Within normal limits. Spleen: Within normal limits. Adrenals/Urinary Tract: Adrenal glands are within normal limits. Kidneys are within normal limits.  No hydronephrosis. Bladder is within normal limits. Stomach/Bowel: Stomach is within normal limits. Status post partial colectomy with  left lower quadrant colostomy and Hartman's pouch. Mildly prominent loops of small bowel in the left mid abdomen, favoring adynamic postoperative ileus. Vascular/Lymphatic: No evidence of abdominal aortic aneurysm. Mild atherosclerotic calcifications the abdominal aorta. No suspicious abdominopelvic lymphadenopathy. Reproductive: Status post hysterectomy. No adnexal masses. Other: Tiny scattered foci of nondependent gas beneath the anterior abdominal wall (for example, series 3/images 38 and 50), postsurgical. Multiple interloop fluid collections throughout the abdomen, nonspecific given recent postoperative status. Some of these may be loculated, with mild peripheral enhancement, while many may be free-flowing. The largest collection, along the jejunal mesentery in the left mid abdomen, measures up to 7.8 cm in transverse dimension but only 2.7 cm in AP dimension (series 3/image 42). Additional pelvic fluid with indwelling surgical drain. Musculoskeletal: Postsurgical changes along the midline anterior abdominal wall. Visualized osseous structures are within normal limits. 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 Multiple interloop fluid collections, as described above, nonspecific given recent postoperative status. If symptoms worsen (fever/leukocytosis), consider follow-up CT to assess for resolution and/or abscess development. Mildly prominent loops of small bowel in the left mid abdomen, favoring adynamic postoperative ileus. Additional ancillary findings as above. Electronically Signed   By: Julian Hy M.D.   On: 06/12/2018 10:47   Ct Abdomen Pelvis W Contrast  Result Date: 06/03/2018 CLINICAL DATA:  Lower abdominal pain x2 days with nausea and vomiting. EXAM: CT ABDOMEN AND PELVIS WITH CONTRAST TECHNIQUE: Multidetector CT imaging of the abdomen and pelvis was performed using the standard protocol following bolus administration of intravenous  contrast. CONTRAST:  159mL OMNIPAQUE IOHEXOL 300 MG/ML  SOLN COMPARISON:  10/26/2016 FINDINGS: Lower chest: Normal heart size without pericardial effusion. Subsegmental atelectasis at the lung bases. Small hiatal hernia. Hepatobiliary: Cholecystectomy. No space-occupying mass of the liver. No biliary dilatation. Pancreas: Normal Spleen: Normal Adrenals/Urinary Tract: Normal bilateral adrenal glands. Symmetric cortical enhancement of both kidneys without nephrolithiasis nor obstructive uropathy. The urinary bladder is unremarkable for the degree of distention. Stomach/Bowel: The stomach is decompressed. The duodenal sweep and ligament of Treitz position is normal. Small bowel dilatation or inflammation. The distal and terminal ileum are unremarkable. The appendix is not well visualized. Increased colonic stool burden is noted within the  cecum and ascending colon. Additionally there is a large amount of stool with transmural thickening and involving the rectosigmoid suspicious for stercoral colitis. Associated small amount free fluid is seen in the pelvis. Vascular/Lymphatic: No significant vascular findings are present. No enlarged abdominal or pelvic lymph nodes. Reproductive: Status post hysterectomy. No adnexal masses. Other: Small amount of free fluid in the pelvis as described. Musculoskeletal: No acute nor suspicious osseous abnormalities. Lower lumbar facet arthropathy with mild disc flattening at L4-5 and anterolisthesis of L4 on L5. IMPRESSION: Moderate stool retention within the rectosigmoid with transmural thickening, adjacent small volume of free fluid and inflammation consistent with stercoral colitis. Electronically Signed   By: Ashley Royalty M.D.   On: 06/03/2018 03:54   US Renal  Result Date: 06/05/2018 CLINICAL DATA:  Acute kidney injury EXAM: RENAL / URINARY TRACT ULTRASOUND COMPLETE COMPARISON:  None. FINDINGS: Right Kidney: Length: 11.3 cm. Echogenicity within normal limits. No mass or  hydronephrosis visualized. Left Kidney: Length: 11 cm. Echogenicity within normal limits. No mass or hydronephrosis visualized. Bladder: Appears normal for degree of bladder distention. IMPRESSION: Normal renal ultrasound. Electronically Signed   By: Kathreen Devoid   On: 06/05/2018 10:50   Dg Abd Acute W/chest  Result Date: 06/05/2018 CLINICAL DATA:  Constipation; abdominal pain EXAM: DG ABDOMEN ACUTE W/ 1V CHEST COMPARISON:  Chest radiograph Mar 01, 2011; CT abdomen and pelvis June 03, 2018 FINDINGS: PA chest: There is atelectatic change in the right base. Lungs elsewhere are clear. Heart size and pulmonary vascularity are normal. No adenopathy. Supine and upright abdomen: There is distension of the distal sigmoid colon and upper rectum due to stool. There are loops of dilated bowel with scattered air-fluid levels. No free air evident. Surgical clips are noted in the right upper quadrant region. There are phleboliths in the pelvis. IMPRESSION: Bowel dilatation with scattered air-fluid levels. A degree of bowel obstruction is felt to be present. Note that there are loops of both small and large bowel dilatation with air-fluid levels in these areas. Question functional bowel obstruction due to relative obstruction of sigmoid colon due to stool. The appearance currently is consistent with the findings suggesting stercoral colitis distally on recent CT. No free air evident. Right base atelectasis.  Lungs elsewhere clear. Electronically Signed   By: Lowella Grip III M.D.   On: 06/05/2018 09:06   Dg Abd Portable 1v  Result Date: 06/10/2018 CLINICAL DATA:  Postoperative abdominal pain and ileus. EXAM: PORTABLE ABDOMEN - 1 VIEW COMPARISON:  CT scan and radiograph of June 05, 2018. FINDINGS: The bowel gas pattern is normal. Surgical drain is seen in the pelvis. Status post cholecystectomy. Distal tip of nasogastric tube seen in proximal stomach. IMPRESSION: No evidence of bowel obstruction or ileus. Surgical  drain is noted in the pelvis. Electronically Signed   By: Marijo Conception, M.D.   On: 06/10/2018 13:12   Dg Abd Portable 1v  Result Date: 06/05/2018 CLINICAL DATA:  Nasogastric tube position EXAM: PORTABLE ABDOMEN - 1 VIEW COMPARISON:  Portable exam 1206 hours compared to 06/05/2018 at 0827 hours FINDINGS: Nasogastric tube coiled in proximal stomach with tip near fundus. Air-filled loops of large and small bowel again identified in the upper abdomen. Surgical clips RIGHT upper quadrant. Bones demineralized. IMPRESSION: Nasogastric tube coiled in proximal stomach. Electronically Signed   By: Lavonia Dana M.D.   On: 06/05/2018 13:02   [place] Ekg Site Rite  Result Date: 06/06/2018 If Site Rite image not attached, placement could not be confirmed  due to current cardiac rhythm.     TODAY-DAY OF DISCHARGE:  Subjective:   [name] today has no headache,no chest abdominal pain,no new weakness tingling or numbness, feels much better wants to go home today.   Objective:   Blood pressure 127/63, pulse 70, temperature 98 F (36.7 C), temperature source Oral, resp. rate 16, height 5\' 5"  (1.651 m), weight 76.4 kg, SpO2 99 %.  Intake/Output Summary (Last 24 hours) at 06/15/2018 1025 Last data filed at 06/15/2018 0900 Gross per 24 hour  Intake 1028 ml  Output 1495 ml  Net -467 ml   Filed Weights   06/11/18 0427 06/12/18 0501 06/14/18 0909  Weight: 86.9 kg 87 kg 76.4 kg    Exam: Awake Alert, Oriented *3, No new F.N deficits, Normal affect [place].AT,PERRAL Supple Neck,No JVD, No cervical lymphadenopathy appriciated.  Symmetrical Chest wall movement, Good air movement bilaterally, CTAB RRR,No Gallops,Rubs or new Murmurs, No Parasternal Heave +ve B.Sounds, Abd Soft, Non tender, No organomegaly appriciated, No rebound -guarding or rigidity. No Cyanosis, Clubbing  No new Rash or bruise.  1+ edema in the lower extremities  PERTINENT RADIOLOGIC STUDIES: Ct Abdomen Pelvis Wo Contrast  Result Date:  06/05/2018 CLINICAL DATA:  62 year old female with a history of abdominal pain with nausea vomiting. Constipation. EXAM: CT ABDOMEN AND PELVIS WITHOUT CONTRAST TECHNIQUE: Multidetector CT imaging of the abdomen and pelvis was performed following the standard protocol without IV contrast. COMPARISON:  06/03/2018 FINDINGS: Lower chest: Trace atelectasis and pleural fluid at the bilateral lung bases. Hepatobiliary: Cranial caudal span of the right liver measures 16.4 cm. Surgical changes of cholecystectomy. Pancreas: Unremarkable pancreas. Spleen: Unremarkable spleen Adrenals/Urinary Tract: Bilateral adrenal glands unremarkable. There has been delayed excretion from the right kidney collecting system with mild hydronephrosis. Contrast present through the length of the proximal and mid segment of the right ureter. Left kidney demonstrates near complete evacuation/excretion of contrast from the collecting system and the left ureter. Urinary bladder demonstrates retention of some contrast. Stomach/Bowel: Gastric tube terminates within the stomach. Stomach is decompressed. Distended loops of small bowel with associated air-fluid levels. Since the prior CT there has been significant fluid accumulation throughout the length of the colon, with distention of cecum, right colon, transverse colon and descending colon. There is similar configuration of formed stool within the sigmoid colon, with a loop of sigmoid colon and associated twisted mesentery of the low abdomen/pelvis. The distal rectum is decompressed. Crescent of air along the anterior aspect of the sigmoid colon on image 70 favored to be within the lumen of bowel. Vascular/Lymphatic: Mild atherosclerosis of the abdominal aorta. No adenopathy. Small volume of free fluid within the left pelvis, present on the comparison CT, likely reactive. Reproductive: Hysterectomy Other: Small fat containing umbilical hernia. Musculoskeletal: No acute displaced fracture. Degenerative  changes of the spine. IMPRESSION: CT demonstrates sigmoid volvulus with signs of worsening bowel obstruction. Crescent of air at the anterior aspect of stool-filled sigmoid colon I would favored to be within bowel lumen, as there are no other signs of bowel perforation on the CT. These results were discussed at the time of interpretation on 06/05/2018 at 3:41 pm with Dr. Ralene Ok. Delayed excretion of contrast from the right collecting system, potentially secondary to distal inflammation or compression of the right ureter secondary to pelvic changes. Electronically Signed   By: Corrie Mckusick D.O.   On: 06/05/2018 15:43   Ct Abdomen Pelvis W Contrast  Result Date: 06/12/2018 CLINICAL DATA:  Fever, abdominal pain, status post exploratory  laparotomy with partial colectomy on 08/07 EXAM: CT ABDOMEN AND PELVIS WITH CONTRAST TECHNIQUE: Multidetector CT imaging of the abdomen and pelvis was performed using the standard protocol following bolus administration of intravenous contrast. CONTRAST:  148mL OMNIPAQUE IOHEXOL 300 MG/ML  SOLN COMPARISON:  06/05/2018 FINDINGS: Lower chest: Trace right pleural effusion. Mild patchy right lower lobe opacity, likely atelectasis. Hepatobiliary: Liver is within normal limits. Status post cholecystectomy. No intrahepatic or extrahepatic ductal dilatation. Pancreas: Within normal limits. Spleen: Within normal limits. Adrenals/Urinary Tract: Adrenal glands are within normal limits. Kidneys are within normal limits.  No hydronephrosis. Bladder is within normal limits. Stomach/Bowel: Stomach is within normal limits. 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 Mildly prominent loops of small bowel in the left mid abdomen, favoring adynamic postoperative ileus. Vascular/Lymphatic: No evidence of abdominal aortic aneurysm. Mild atherosclerotic calcifications the abdominal aorta. No suspicious abdominopelvic lymphadenopathy. Reproductive: Status  post hysterectomy. No adnexal masses. Other: Tiny scattered foci of nondependent gas beneath the anterior abdominal wall (for example, series 3/images 38 and 50), postsurgical. Multiple interloop fluid collections throughout the abdomen, nonspecific given recent postoperative status. Some of these may be loculated, with mild peripheral enhancement, while many may be free-flowing. The largest collection, along the jejunal mesentery in the left mid abdomen, measures up to 7.8 cm in transverse dimension but only 2.7 cm in AP dimension (series 3/image 42). Additional pelvic fluid with indwelling surgical drain. Musculoskeletal: Postsurgical changes along the midline anterior abdominal wall. Visualized osseous structures are within normal limits. 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 Multiple interloop fluid collections, as described above, nonspecific given recent postoperative status. If symptoms worsen (fever/leukocytosis), consider follow-up CT to assess for resolution and/or abscess development. Mildly prominent loops of small bowel in the left mid abdomen, favoring adynamic postoperative ileus. Additional ancillary findings as above. Electronically Signed   By: Julian Hy M.D.   On: 06/12/2018 10:47   Ct Abdomen Pelvis W Contrast  Result Date: 06/03/2018 CLINICAL DATA:  Lower abdominal pain x2 days with nausea and vomiting. EXAM: CT ABDOMEN AND PELVIS WITH CONTRAST TECHNIQUE: Multidetector CT imaging of the abdomen and pelvis was performed using the standard protocol following bolus administration of intravenous contrast. CONTRAST:  159mL OMNIPAQUE IOHEXOL 300 MG/ML  SOLN COMPARISON:  10/26/2016 FINDINGS: Lower chest: Normal heart size without pericardial effusion. Subsegmental atelectasis at the lung bases. Small hiatal hernia. Hepatobiliary: Cholecystectomy. No space-occupying mass of the liver. No biliary dilatation. Pancreas: Normal Spleen:  Normal Adrenals/Urinary Tract: Normal bilateral adrenal glands. Symmetric cortical enhancement of both kidneys without nephrolithiasis nor obstructive uropathy. The urinary bladder is unremarkable for the degree of distention. Stomach/Bowel: The stomach is decompressed. The duodenal sweep and ligament of Treitz position is normal. Small bowel dilatation or inflammation. The distal and terminal ileum are unremarkable. The appendix is not well visualized. Increased colonic stool burden is noted within the cecum and ascending colon. Additionally there is a large amount of stool with transmural thickening and involving the rectosigmoid suspicious for stercoral colitis. Associated small amount free fluid is seen in the pelvis. Vascular/Lymphatic: No significant vascular findings are present. No enlarged abdominal or pelvic lymph nodes. Reproductive: Status post hysterectomy. No adnexal masses. Other: Small amount of free fluid in the pelvis as described. Musculoskeletal: No acute nor suspicious osseous abnormalities. Lower lumbar facet arthropathy with mild disc flattening at L4-5 and anterolisthesis of L4 on L5. IMPRESSION: Moderate stool retention within the rectosigmoid with transmural thickening, adjacent  small volume of free fluid and inflammation consistent with stercoral colitis. Electronically Signed   By: Ashley Royalty M.D.   On: 06/03/2018 03:54   US Renal  Result Date: 06/05/2018 CLINICAL DATA:  Acute kidney injury EXAM: RENAL / URINARY TRACT ULTRASOUND COMPLETE COMPARISON:  None. FINDINGS: Right Kidney: Length: 11.3 cm. Echogenicity within normal limits. No mass or hydronephrosis visualized. Left Kidney: Length: 11 cm. Echogenicity within normal limits. No mass or hydronephrosis visualized. Bladder: Appears normal for degree of bladder distention. IMPRESSION: Normal renal ultrasound. Electronically Signed   By: Kathreen Devoid   On: 06/05/2018 10:50   Dg Abd Acute W/chest  Result Date: 06/05/2018 CLINICAL  DATA:  Constipation; abdominal pain EXAM: DG ABDOMEN ACUTE W/ 1V CHEST COMPARISON:  Chest radiograph Mar 01, 2011; CT abdomen and pelvis June 03, 2018 FINDINGS: PA chest: There is atelectatic change in the right base. Lungs elsewhere are clear. Heart size and pulmonary vascularity are normal. No adenopathy. Supine and upright abdomen: There is distension of the distal sigmoid colon and upper rectum due to stool. There are loops of dilated bowel with scattered air-fluid levels. No free air evident. Surgical clips are noted in the right upper quadrant region. There are phleboliths in the pelvis. IMPRESSION: Bowel dilatation with scattered air-fluid levels. A degree of bowel obstruction is felt to be present. Note that there are loops of both small and large bowel dilatation with air-fluid levels in these areas. Question functional bowel obstruction due to relative obstruction of sigmoid colon due to stool. The appearance currently is consistent with the findings suggesting stercoral colitis distally on recent CT. No free air evident. Right base atelectasis.  Lungs elsewhere clear. Electronically Signed   By: Lowella Grip III M.D.   On: 06/05/2018 09:06   Dg Abd Portable 1v  Result Date: 06/10/2018 CLINICAL DATA:  Postoperative abdominal pain and ileus. EXAM: PORTABLE ABDOMEN - 1 VIEW COMPARISON:  CT scan and radiograph of June 05, 2018. FINDINGS: The bowel gas pattern is normal. Surgical drain is seen in the pelvis. Status post cholecystectomy. Distal tip of nasogastric tube seen in proximal stomach. IMPRESSION: No evidence of bowel obstruction or ileus. Surgical drain is noted in the pelvis. Electronically Signed   By: Marijo Conception, M.D.   On: 06/10/2018 13:12   Dg Abd Portable 1v  Result Date: 06/05/2018 CLINICAL DATA:  Nasogastric tube position EXAM: PORTABLE ABDOMEN - 1 VIEW COMPARISON:  Portable exam 1206 hours compared to 06/05/2018 at 0827 hours FINDINGS: Nasogastric tube coiled in proximal  stomach with tip near fundus. Air-filled loops of large and small bowel again identified in the upper abdomen. Surgical clips RIGHT upper quadrant. Bones demineralized. IMPRESSION: Nasogastric tube coiled in proximal stomach. Electronically Signed   By: Lavonia Dana M.D.   On: 06/05/2018 13:02   [place] Ekg Site Rite  Result Date: 06/06/2018 If Site Rite image not attached, placement could not be confirmed due to current cardiac rhythm.    PERTINENT LAB RESULTS: CBC: Recent Labs    06/14/18 0444  WBC 13.5*  HGB 9.4*  HCT 29.5*  PLT 507*   CMET CMP     Component Value Date/Time   NA 139 06/15/2018 0346   K 3.7 06/15/2018 0346   CL 105 06/15/2018 0346   CO2 27 06/15/2018 0346   GLUCOSE 99 06/15/2018 0346   BUN 9 06/15/2018 0346   CREATININE 0.78 06/15/2018 0346   CALCIUM 7.5 (L) 06/15/2018 0346   PROT 4.8 (L) 06/13/2018 2542  ALBUMIN 1.9 (L) 06/13/2018 0426   AST 25 06/13/2018 0426   ALT 19 06/13/2018 0426   ALKPHOS 99 06/13/2018 0426   BILITOT 0.5 06/13/2018 0426   GFRNONAA >60 06/15/2018 0346   GFRAA >60 06/15/2018 0346    GFR Estimated Creatinine Clearance: 73.6 mL/min (by C-G formula based on SCr of 0.78 mg/dL). No results for input(s): LIPASE, AMYLASE in the last 72 hours. No results for input(s): CKTOTAL, CKMB, CKMBINDEX, TROPONINI in the last 72 hours. Invalid input(s): POCBNP No results for input(s): DDIMER in the last 72 hours. No results for input(s): HGBA1C in the last 72 hours. No results for input(s): CHOL, HDL, LDLCALC, TRIG, CHOLHDL, LDLDIRECT in the last 72 hours. No results for input(s): TSH, T4TOTAL, T3FREE, THYROIDAB in the last 72 hours.  Invalid input(s): FREET3 No results for input(s): VITAMINB12, FOLATE, FERRITIN, TIBC, IRON, RETICCTPCT in the last 72 hours. Coags: No results for input(s): INR in the last 72 hours.  Invalid input(s): PT Microbiology: Recent Results (from the past 240 hour(s))  Surgical pcr screen     Status: None   Collection  Time: 06/05/18  4:27 PM  Result Value Ref Range Status   MRSA, PCR NEGATIVE NEGATIVE Final   Staphylococcus aureus NEGATIVE NEGATIVE Final    Comment: (NOTE) The Xpert SA Assay (FDA approved for NASAL specimens in patients 1 years of age and older), is one component of a comprehensive surveillance program. It is not intended to diagnose infection nor to guide or monitor treatment. Performed at [facility], [place] [address], [place], [place] [address]     FURTHER DISCHARGE INSTRUCTIONS:  Get Medicines reviewed and adjusted: Please take all your medications with you for your next visit with your Primary MD  Laboratory/radiological data: Please request your Primary MD to go over all hospital tests and procedure/radiological results at the follow up, please ask your Primary MD to get all Hospital records sent to his/her office.  In some cases, they will be blood work, cultures and biopsy results pending at the time of your discharge. Please request that your primary care M.D. goes through all the records of your hospital data and follows up on these results.  Also Note the following: If you experience worsening of your admission symptoms, develop shortness of breath, life threatening emergency, suicidal or homicidal thoughts you must seek medical attention immediately by calling 911 or calling your MD immediately  if symptoms less severe.  You must read complete instructions/literature along with all the possible adverse reactions/side effects for all the Medicines you take and that have been prescribed to you. Take any new Medicines after you have completely understood and accpet all the possible adverse reactions/side effects.   Do not drive when taking Pain medications or sleeping medications (Benzodaizepines)  Do not take more than prescribed Pain, Sleep and Anxiety Medications. It is not advisable to combine anxiety,sleep and pain medications without talking with your primary  care practitioner  Special Instructions: If you have smoked or chewed Tobacco  in the last 2 yrs please stop smoking, stop any regular Alcohol  and or any Recreational drug use.  Wear Seat belts while driving.  Please note: You were cared for by a hospitalist during your hospital stay. Once you are discharged, your primary care physician will handle any further medical issues. Please note that NO REFILLS for any discharge medications will be authorized once you are discharged, as it is imperative that you return to your primary care physician (or establish  a relationship with a primary care physician if you do not have one) for your post hospital discharge needs so that they can reassess your need for medications and monitor your lab values.  Total Time spent coordinating discharge including counseling, education and face to face time equals 45 minutes.  SignedOren Binet 06/15/2018 10:25 AM

## 2018-06-15 NOTE — Progress Notes (Signed)
[facility] Progress Note  10 Days Post-Op  Subjective: CC-  No new complaints. Husband at bedside. Reports some mild nausea over night, no emesis. Thinks that this was due to an empty stomach because when she had something to drink her nausea resolved. Ostomy functioning.  Objective: Vital signs in last 24 hours: Temp:  [98 F (36.7 C)-99.1 F (37.3 C)] 98 F (36.7 C) (08/17 0507) Pulse Rate:  [70-76] 70 (08/17 0507) Resp:  [16-18] 16 (08/17 0507) BP: (113-127)/(50-63) 127/63 (08/17 0507) SpO2:  [98 %-100 %] 99 % (08/17 0738) Weight:  [76.4 kg] 76.4 kg (08/16 0909) Last BM Date: 06/13/18  Intake/Output from previous day: 08/16 0701 - 08/17 0700 In: 706 [P.O.:596; I.V.:10; IV Piggyback:100] Out: 1245 [Urine:1200; Stool:45] Intake/Output this shift: No intake/output data recorded.  PE: Gen:  Alert, NAD, pleasant Pulm:  effort normal Abd: soft, appropriately tender, ND, small amount of stool in pouch, midline wound with necrotic base and trace slough noted in distal aspect of wound Psych: A&Ox3   Lab Results:  Recent Labs    06/14/18 0444  WBC 13.5*  HGB 9.4*  HCT 29.5*  PLT 507*   BMET Recent Labs    06/14/18 0802 06/15/18 0346  NA 137 139  K 3.8 3.7  CL 103 105  CO2 28 27  GLUCOSE 93 99  BUN 9 9  CREATININE 0.84 0.78  CALCIUM 7.6* 7.5*   PT/INR No results for input(s): LABPROT, INR in the last 72 hours. CMP     Component Value Date/Time   NA 139 06/15/2018 0346   K 3.7 06/15/2018 0346   CL 105 06/15/2018 0346   CO2 27 06/15/2018 0346   GLUCOSE 99 06/15/2018 0346   BUN 9 06/15/2018 0346   CREATININE 0.78 06/15/2018 0346   CALCIUM 7.5 (L) 06/15/2018 0346   PROT 4.8 (L) 06/13/2018 0426   ALBUMIN 1.9 (L) 06/13/2018 0426   AST 25 06/13/2018 0426   ALT 19 06/13/2018 0426   ALKPHOS 99 06/13/2018 0426   BILITOT 0.5 06/13/2018 0426   GFRNONAA >60 06/15/2018 0346   GFRAA >60 06/15/2018 0346   Lipase     Component Value Date/Time    LIPASE 42 06/03/2018 0140       Studies/Results: No results found.  Anti-infectives: Anti-infectives (From admission, onward)   Start     Dose/Rate Route Frequency Ordered Stop   06/14/18 1000  amoxicillin-clavulanate (AUGMENTIN) 875-125 MG per tablet 1 tablet     1 tablet Oral Every 12 hours 06/14/18 0837     06/14/18 0000  amoxicillin-clavulanate (AUGMENTIN) 875-125 MG tablet     1 tablet Oral Every 12 hours 06/14/18 0846 06/28/18 2359   06/06/18 0600  cefoTEtan (CEFOTAN) 2 g in sodium chloride 0.9 % 100 mL IVPB  Status:  Discontinued     2 g 200 mL/hr over 30 Minutes Intravenous On call to O.R. 06/05/18 1608 06/05/18 1624   06/06/18 0600  cefoTEtan in Dextrose 5% (CEFOTAN) IVPB 2 g  Status:  Discontinued     2 g 100 mL/hr over 30 Minutes Intravenous 60 min pre-op 06/05/18 1625 06/10/18 0918   06/05/18 1930  piperacillin-tazobactam (ZOSYN) IVPB 3.375 g  Status:  Discontinued     3.375 g 12.5 mL/hr over 240 Minutes Intravenous Every 8 hours 06/05/18 1904 06/14/18 0837   06/05/18 0900  piperacillin-tazobactam (ZOSYN) IVPB 3.375 g  Status:  Discontinued     3.375 g 12.5 mL/hr over 240 Minutes Intravenous Every 8 hours 06/05/18  1517 06/05/18 1904   06/03/18 1000  hydroxychloroquine (PLAQUENIL) tablet 200 mg  Status:  Discontinued     200 mg Oral 2 times daily 06/03/18 0742 06/05/18 0731   06/03/18 0730  piperacillin-tazobactam (ZOSYN) IVPB 3.375 g     3.375 g 12.5 mL/hr over 240 Minutes Intravenous  Once 06/03/18 0726 06/03/18 1246   06/03/18 0630  ciprofloxacin (CIPRO) IVPB 400 mg  Status:  Discontinued     400 mg 200 mL/hr over 60 Minutes Intravenous  Once 06/03/18 0617 06/03/18 0725   06/03/18 0630  metroNIDAZOLE (FLAGYL) IVPB 500 mg  Status:  Discontinued     500 mg 100 mL/hr over 60 Minutes Intravenous  Once 06/03/18 0617 06/03/18 0725       Assessment/Plan AKI Hypothyroidism Anxiety and depression Restless leg syndrome HLD  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 Rosendo Gros - POD#10 - CT with multiple small interloop fluid collections. Not drainable. Cont abx therapy.  She will need a total of 14 days at discharge (augmentin) -cont soft diet  -abdominal binder to help with mild wound separation PRN. Cont dressing changes -mobilize -pulm toilet -HH has been ordered for care of wound and ostomy at home.  OHY:WVPX diet, Ensure ID: Zosyn 8/8 >>8/16, Augmentin 8/16>> VTE: SCD's, Lovenox  Foley:DC8/9  Dispo: Patient stable for discharge from surgical standpoint. HH wound and ostomy care arranged. Continue augmentin for a total of 14 days. F/u info and discharge instructions on AVS.   LOS: 11 days    Wellington Hampshire , [facility] 06/15/2018, 8:20 AM Pager: [phone] Consults: [phone] Mon 7:00 am -11:30 AM Tues-Fri 7:00 am-4:30 pm Sat-Sun 7:00 am-11:30 am

## 2018-06-18 DIAGNOSIS — Z433 Encounter for attention to colostomy: Secondary | ICD-10-CM | POA: Diagnosis not present

## 2018-06-18 DIAGNOSIS — I480 Paroxysmal atrial fibrillation: Secondary | ICD-10-CM | POA: Diagnosis not present

## 2018-06-18 DIAGNOSIS — R5382 Chronic fatigue, unspecified: Secondary | ICD-10-CM | POA: Diagnosis not present

## 2018-06-18 DIAGNOSIS — D510 Vitamin B12 deficiency anemia due to intrinsic factor deficiency: Secondary | ICD-10-CM | POA: Diagnosis not present

## 2018-06-18 DIAGNOSIS — J45909 Unspecified asthma, uncomplicated: Secondary | ICD-10-CM | POA: Diagnosis not present

## 2018-06-18 DIAGNOSIS — K5780 Diverticulitis of intestine, part unspecified, with perforation and abscess without bleeding: Secondary | ICD-10-CM | POA: Diagnosis not present

## 2018-06-18 DIAGNOSIS — F419 Anxiety disorder, unspecified: Secondary | ICD-10-CM | POA: Diagnosis not present

## 2018-06-18 DIAGNOSIS — K219 Gastro-esophageal reflux disease without esophagitis: Secondary | ICD-10-CM | POA: Diagnosis not present

## 2018-06-18 DIAGNOSIS — K529 Noninfective gastroenteritis and colitis, unspecified: Secondary | ICD-10-CM | POA: Diagnosis not present

## 2018-06-18 DIAGNOSIS — F329 Major depressive disorder, single episode, unspecified: Secondary | ICD-10-CM | POA: Diagnosis not present

## 2018-06-18 DIAGNOSIS — I10 Essential (primary) hypertension: Secondary | ICD-10-CM | POA: Diagnosis not present

## 2018-06-18 DIAGNOSIS — E785 Hyperlipidemia, unspecified: Secondary | ICD-10-CM | POA: Diagnosis not present

## 2018-06-18 DIAGNOSIS — K589 Irritable bowel syndrome without diarrhea: Secondary | ICD-10-CM | POA: Diagnosis not present

## 2018-06-18 DIAGNOSIS — L959 Vasculitis limited to the skin, unspecified: Secondary | ICD-10-CM | POA: Diagnosis not present

## 2018-06-18 DIAGNOSIS — Z8542 Personal history of malignant neoplasm of other parts of uterus: Secondary | ICD-10-CM | POA: Diagnosis not present

## 2018-06-19 DIAGNOSIS — K5780 Diverticulitis of intestine, part unspecified, with perforation and abscess without bleeding: Secondary | ICD-10-CM | POA: Diagnosis not present

## 2018-06-19 DIAGNOSIS — Z9049 Acquired absence of other specified parts of digestive tract: Secondary | ICD-10-CM | POA: Diagnosis not present

## 2018-06-19 DIAGNOSIS — I10 Essential (primary) hypertension: Secondary | ICD-10-CM | POA: Diagnosis not present

## 2018-06-19 DIAGNOSIS — F3341 Major depressive disorder, recurrent, in partial remission: Secondary | ICD-10-CM | POA: Diagnosis not present

## 2018-06-19 DIAGNOSIS — K589 Irritable bowel syndrome without diarrhea: Secondary | ICD-10-CM | POA: Diagnosis not present

## 2018-06-19 DIAGNOSIS — Z433 Encounter for attention to colostomy: Secondary | ICD-10-CM | POA: Diagnosis not present

## 2018-06-19 DIAGNOSIS — D510 Vitamin B12 deficiency anemia due to intrinsic factor deficiency: Secondary | ICD-10-CM | POA: Diagnosis not present

## 2018-06-19 DIAGNOSIS — K529 Noninfective gastroenteritis and colitis, unspecified: Secondary | ICD-10-CM | POA: Diagnosis not present

## 2018-06-20 DIAGNOSIS — Z433 Encounter for attention to colostomy: Secondary | ICD-10-CM | POA: Diagnosis not present

## 2018-06-20 DIAGNOSIS — I10 Essential (primary) hypertension: Secondary | ICD-10-CM | POA: Diagnosis not present

## 2018-06-20 DIAGNOSIS — K5780 Diverticulitis of intestine, part unspecified, with perforation and abscess without bleeding: Secondary | ICD-10-CM | POA: Diagnosis not present

## 2018-06-20 DIAGNOSIS — K589 Irritable bowel syndrome without diarrhea: Secondary | ICD-10-CM | POA: Diagnosis not present

## 2018-06-20 DIAGNOSIS — D510 Vitamin B12 deficiency anemia due to intrinsic factor deficiency: Secondary | ICD-10-CM | POA: Diagnosis not present

## 2018-06-20 DIAGNOSIS — K529 Noninfective gastroenteritis and colitis, unspecified: Secondary | ICD-10-CM | POA: Diagnosis not present

## 2018-06-21 ENCOUNTER — Ambulatory Visit
Admission: RE | Admit: 2018-06-21 | Discharge: 2018-06-21 | Disposition: A | Discharge status: Home / Self Care | Payer: Medicare Other | Source: Ambulatory Visit | Attending: Student | Admitting: Student

## 2018-06-21 ENCOUNTER — Other Ambulatory Visit: Payer: Self-pay | Admitting: Student

## 2018-06-21 DIAGNOSIS — R109 Unspecified abdominal pain: Secondary | ICD-10-CM

## 2018-06-21 DIAGNOSIS — S31109A Unspecified open wound of abdominal wall, unspecified quadrant without penetration into peritoneal cavity, initial encounter: Secondary | ICD-10-CM | POA: Diagnosis not present

## 2018-06-21 DIAGNOSIS — Z933 Colostomy status: Secondary | ICD-10-CM | POA: Diagnosis not present

## 2018-06-21 MED ORDER — IOPAMIDOL (ISOVUE-300) INJECTION 61%
100.0000 mL | Freq: Once | INTRAVENOUS | Status: AC | PRN
  Administered 2018-06-21: 100 mL via INTRAVENOUS

## 2018-06-22 DIAGNOSIS — K589 Irritable bowel syndrome without diarrhea: Secondary | ICD-10-CM | POA: Diagnosis not present

## 2018-06-22 DIAGNOSIS — I10 Essential (primary) hypertension: Secondary | ICD-10-CM | POA: Diagnosis not present

## 2018-06-22 DIAGNOSIS — Z433 Encounter for attention to colostomy: Secondary | ICD-10-CM | POA: Diagnosis not present

## 2018-06-22 DIAGNOSIS — K529 Noninfective gastroenteritis and colitis, unspecified: Secondary | ICD-10-CM | POA: Diagnosis not present

## 2018-06-22 DIAGNOSIS — D510 Vitamin B12 deficiency anemia due to intrinsic factor deficiency: Secondary | ICD-10-CM | POA: Diagnosis not present

## 2018-06-22 DIAGNOSIS — K5780 Diverticulitis of intestine, part unspecified, with perforation and abscess without bleeding: Secondary | ICD-10-CM | POA: Diagnosis not present

## 2018-06-24 DIAGNOSIS — Z433 Encounter for attention to colostomy: Secondary | ICD-10-CM | POA: Diagnosis not present

## 2018-06-24 DIAGNOSIS — K589 Irritable bowel syndrome without diarrhea: Secondary | ICD-10-CM | POA: Diagnosis not present

## 2018-06-24 DIAGNOSIS — K529 Noninfective gastroenteritis and colitis, unspecified: Secondary | ICD-10-CM | POA: Diagnosis not present

## 2018-06-24 DIAGNOSIS — K5780 Diverticulitis of intestine, part unspecified, with perforation and abscess without bleeding: Secondary | ICD-10-CM | POA: Diagnosis not present

## 2018-06-24 DIAGNOSIS — I10 Essential (primary) hypertension: Secondary | ICD-10-CM | POA: Diagnosis not present

## 2018-06-24 DIAGNOSIS — D510 Vitamin B12 deficiency anemia due to intrinsic factor deficiency: Secondary | ICD-10-CM | POA: Diagnosis not present

## 2018-06-26 DIAGNOSIS — Z433 Encounter for attention to colostomy: Secondary | ICD-10-CM | POA: Diagnosis not present

## 2018-06-26 DIAGNOSIS — K5780 Diverticulitis of intestine, part unspecified, with perforation and abscess without bleeding: Secondary | ICD-10-CM | POA: Diagnosis not present

## 2018-06-26 DIAGNOSIS — K589 Irritable bowel syndrome without diarrhea: Secondary | ICD-10-CM | POA: Diagnosis not present

## 2018-06-26 DIAGNOSIS — I10 Essential (primary) hypertension: Secondary | ICD-10-CM | POA: Diagnosis not present

## 2018-06-26 DIAGNOSIS — D510 Vitamin B12 deficiency anemia due to intrinsic factor deficiency: Secondary | ICD-10-CM | POA: Diagnosis not present

## 2018-06-26 DIAGNOSIS — K529 Noninfective gastroenteritis and colitis, unspecified: Secondary | ICD-10-CM | POA: Diagnosis not present

## 2018-06-27 DIAGNOSIS — K529 Noninfective gastroenteritis and colitis, unspecified: Secondary | ICD-10-CM | POA: Diagnosis not present

## 2018-06-27 DIAGNOSIS — D510 Vitamin B12 deficiency anemia due to intrinsic factor deficiency: Secondary | ICD-10-CM | POA: Diagnosis not present

## 2018-06-27 DIAGNOSIS — K5780 Diverticulitis of intestine, part unspecified, with perforation and abscess without bleeding: Secondary | ICD-10-CM | POA: Diagnosis not present

## 2018-06-27 DIAGNOSIS — K589 Irritable bowel syndrome without diarrhea: Secondary | ICD-10-CM | POA: Diagnosis not present

## 2018-06-27 DIAGNOSIS — I10 Essential (primary) hypertension: Secondary | ICD-10-CM | POA: Diagnosis not present

## 2018-06-27 DIAGNOSIS — Z433 Encounter for attention to colostomy: Secondary | ICD-10-CM | POA: Diagnosis not present

## 2018-07-01 DIAGNOSIS — I10 Essential (primary) hypertension: Secondary | ICD-10-CM | POA: Diagnosis not present

## 2018-07-01 DIAGNOSIS — K589 Irritable bowel syndrome without diarrhea: Secondary | ICD-10-CM | POA: Diagnosis not present

## 2018-07-01 DIAGNOSIS — K529 Noninfective gastroenteritis and colitis, unspecified: Secondary | ICD-10-CM | POA: Diagnosis not present

## 2018-07-01 DIAGNOSIS — Z433 Encounter for attention to colostomy: Secondary | ICD-10-CM | POA: Diagnosis not present

## 2018-07-01 DIAGNOSIS — K5780 Diverticulitis of intestine, part unspecified, with perforation and abscess without bleeding: Secondary | ICD-10-CM | POA: Diagnosis not present

## 2018-07-01 DIAGNOSIS — D510 Vitamin B12 deficiency anemia due to intrinsic factor deficiency: Secondary | ICD-10-CM | POA: Diagnosis not present

## 2018-07-02 DIAGNOSIS — K589 Irritable bowel syndrome without diarrhea: Secondary | ICD-10-CM | POA: Diagnosis not present

## 2018-07-02 DIAGNOSIS — D510 Vitamin B12 deficiency anemia due to intrinsic factor deficiency: Secondary | ICD-10-CM | POA: Diagnosis not present

## 2018-07-02 DIAGNOSIS — I10 Essential (primary) hypertension: Secondary | ICD-10-CM | POA: Diagnosis not present

## 2018-07-02 DIAGNOSIS — Z433 Encounter for attention to colostomy: Secondary | ICD-10-CM | POA: Diagnosis not present

## 2018-07-02 DIAGNOSIS — K529 Noninfective gastroenteritis and colitis, unspecified: Secondary | ICD-10-CM | POA: Diagnosis not present

## 2018-07-02 DIAGNOSIS — K5780 Diverticulitis of intestine, part unspecified, with perforation and abscess without bleeding: Secondary | ICD-10-CM | POA: Diagnosis not present

## 2018-07-03 DIAGNOSIS — Z9889 Other specified postprocedural states: Secondary | ICD-10-CM | POA: Diagnosis not present

## 2018-07-04 DIAGNOSIS — K5780 Diverticulitis of intestine, part unspecified, with perforation and abscess without bleeding: Secondary | ICD-10-CM | POA: Diagnosis not present

## 2018-07-04 DIAGNOSIS — K529 Noninfective gastroenteritis and colitis, unspecified: Secondary | ICD-10-CM | POA: Diagnosis not present

## 2018-07-04 DIAGNOSIS — Z433 Encounter for attention to colostomy: Secondary | ICD-10-CM | POA: Diagnosis not present

## 2018-07-04 DIAGNOSIS — K589 Irritable bowel syndrome without diarrhea: Secondary | ICD-10-CM | POA: Diagnosis not present

## 2018-07-04 DIAGNOSIS — I10 Essential (primary) hypertension: Secondary | ICD-10-CM | POA: Diagnosis not present

## 2018-07-04 DIAGNOSIS — D510 Vitamin B12 deficiency anemia due to intrinsic factor deficiency: Secondary | ICD-10-CM | POA: Diagnosis not present

## 2018-07-08 DIAGNOSIS — K5780 Diverticulitis of intestine, part unspecified, with perforation and abscess without bleeding: Secondary | ICD-10-CM | POA: Diagnosis not present

## 2018-07-08 DIAGNOSIS — I10 Essential (primary) hypertension: Secondary | ICD-10-CM | POA: Diagnosis not present

## 2018-07-08 DIAGNOSIS — K589 Irritable bowel syndrome without diarrhea: Secondary | ICD-10-CM | POA: Diagnosis not present

## 2018-07-08 DIAGNOSIS — D510 Vitamin B12 deficiency anemia due to intrinsic factor deficiency: Secondary | ICD-10-CM | POA: Diagnosis not present

## 2018-07-08 DIAGNOSIS — Z433 Encounter for attention to colostomy: Secondary | ICD-10-CM | POA: Diagnosis not present

## 2018-07-08 DIAGNOSIS — K529 Noninfective gastroenteritis and colitis, unspecified: Secondary | ICD-10-CM | POA: Diagnosis not present

## 2018-07-09 DIAGNOSIS — K589 Irritable bowel syndrome without diarrhea: Secondary | ICD-10-CM | POA: Diagnosis not present

## 2018-07-09 DIAGNOSIS — I10 Essential (primary) hypertension: Secondary | ICD-10-CM | POA: Diagnosis not present

## 2018-07-09 DIAGNOSIS — K529 Noninfective gastroenteritis and colitis, unspecified: Secondary | ICD-10-CM | POA: Diagnosis not present

## 2018-07-09 DIAGNOSIS — K5780 Diverticulitis of intestine, part unspecified, with perforation and abscess without bleeding: Secondary | ICD-10-CM | POA: Diagnosis not present

## 2018-07-09 DIAGNOSIS — Z433 Encounter for attention to colostomy: Secondary | ICD-10-CM | POA: Diagnosis not present

## 2018-07-09 DIAGNOSIS — D510 Vitamin B12 deficiency anemia due to intrinsic factor deficiency: Secondary | ICD-10-CM | POA: Diagnosis not present

## 2018-07-16 DIAGNOSIS — D510 Vitamin B12 deficiency anemia due to intrinsic factor deficiency: Secondary | ICD-10-CM | POA: Diagnosis not present

## 2018-07-16 DIAGNOSIS — I10 Essential (primary) hypertension: Secondary | ICD-10-CM | POA: Diagnosis not present

## 2018-07-16 DIAGNOSIS — K589 Irritable bowel syndrome without diarrhea: Secondary | ICD-10-CM | POA: Diagnosis not present

## 2018-07-16 DIAGNOSIS — Z433 Encounter for attention to colostomy: Secondary | ICD-10-CM | POA: Diagnosis not present

## 2018-07-16 DIAGNOSIS — K5780 Diverticulitis of intestine, part unspecified, with perforation and abscess without bleeding: Secondary | ICD-10-CM | POA: Diagnosis not present

## 2018-07-16 DIAGNOSIS — K529 Noninfective gastroenteritis and colitis, unspecified: Secondary | ICD-10-CM | POA: Diagnosis not present

## 2018-07-22 DIAGNOSIS — H2513 Age-related nuclear cataract, bilateral: Secondary | ICD-10-CM | POA: Diagnosis not present

## 2018-07-22 DIAGNOSIS — H43393 Other vitreous opacities, bilateral: Secondary | ICD-10-CM | POA: Diagnosis not present

## 2018-07-23 DIAGNOSIS — I10 Essential (primary) hypertension: Secondary | ICD-10-CM | POA: Diagnosis not present

## 2018-07-23 DIAGNOSIS — Z433 Encounter for attention to colostomy: Secondary | ICD-10-CM | POA: Diagnosis not present

## 2018-07-23 DIAGNOSIS — K219 Gastro-esophageal reflux disease without esophagitis: Secondary | ICD-10-CM | POA: Diagnosis not present

## 2018-07-23 DIAGNOSIS — R5382 Chronic fatigue, unspecified: Secondary | ICD-10-CM | POA: Diagnosis not present

## 2018-07-23 DIAGNOSIS — K589 Irritable bowel syndrome without diarrhea: Secondary | ICD-10-CM | POA: Diagnosis not present

## 2018-07-23 DIAGNOSIS — L959 Vasculitis limited to the skin, unspecified: Secondary | ICD-10-CM | POA: Diagnosis not present

## 2018-07-23 DIAGNOSIS — D510 Vitamin B12 deficiency anemia due to intrinsic factor deficiency: Secondary | ICD-10-CM | POA: Diagnosis not present

## 2018-07-23 DIAGNOSIS — K529 Noninfective gastroenteritis and colitis, unspecified: Secondary | ICD-10-CM | POA: Diagnosis not present

## 2018-07-23 DIAGNOSIS — K5780 Diverticulitis of intestine, part unspecified, with perforation and abscess without bleeding: Secondary | ICD-10-CM | POA: Diagnosis not present

## 2018-07-31 DIAGNOSIS — Z433 Encounter for attention to colostomy: Secondary | ICD-10-CM | POA: Diagnosis not present

## 2018-07-31 DIAGNOSIS — I10 Essential (primary) hypertension: Secondary | ICD-10-CM | POA: Diagnosis not present

## 2018-07-31 DIAGNOSIS — D510 Vitamin B12 deficiency anemia due to intrinsic factor deficiency: Secondary | ICD-10-CM | POA: Diagnosis not present

## 2018-07-31 DIAGNOSIS — K589 Irritable bowel syndrome without diarrhea: Secondary | ICD-10-CM | POA: Diagnosis not present

## 2018-07-31 DIAGNOSIS — K529 Noninfective gastroenteritis and colitis, unspecified: Secondary | ICD-10-CM | POA: Diagnosis not present

## 2018-07-31 DIAGNOSIS — K5780 Diverticulitis of intestine, part unspecified, with perforation and abscess without bleeding: Secondary | ICD-10-CM | POA: Diagnosis not present

## 2018-08-07 DIAGNOSIS — F411 Generalized anxiety disorder: Secondary | ICD-10-CM | POA: Diagnosis not present

## 2018-08-07 DIAGNOSIS — R42 Dizziness and giddiness: Secondary | ICD-10-CM | POA: Diagnosis not present

## 2018-08-07 DIAGNOSIS — J45998 Other asthma: Secondary | ICD-10-CM | POA: Diagnosis not present

## 2018-08-07 DIAGNOSIS — Z6826 Body mass index (BMI) 26.0-26.9, adult: Secondary | ICD-10-CM | POA: Diagnosis not present

## 2018-08-07 DIAGNOSIS — Z23 Encounter for immunization: Secondary | ICD-10-CM | POA: Diagnosis not present

## 2018-08-08 ENCOUNTER — Ambulatory Visit: Payer: Medicare Other | Attending: Family Medicine

## 2018-08-08 ENCOUNTER — Other Ambulatory Visit: Payer: Self-pay

## 2018-08-08 DIAGNOSIS — R42 Dizziness and giddiness: Secondary | ICD-10-CM | POA: Insufficient documentation

## 2018-08-08 DIAGNOSIS — R2689 Other abnormalities of gait and mobility: Secondary | ICD-10-CM | POA: Diagnosis not present

## 2018-08-08 NOTE — Therapy (Signed)
[facility] [address] [place], [place], [address] Phone: [phone]   Fax:  [phone]  Physical Therapy Evaluation  Patient Details  Name: [name] MRN: [id number] Date of Birth: 01-11-1956 Referring Provider (PT): Dr. Kathyrn Lass   Encounter Date: 08/08/2018  PT End of Session - 08/08/18 1524    Visit Number  1    Number of Visits  17    Date for PT Re-Evaluation  10/07/18    Authorization Type  Medicare and Aetna: PN every 10th visit.     PT Start Time  1317    PT Stop Time  1403    PT Time Calculation (min)  46 min    Equipment Utilized During Treatment  --   S for safety.   Activity Tolerance  Patient tolerated treatment well    Behavior During Therapy  [facility] for tasks assessed/performed       Past Medical History:  Diagnosis Date  . Back pain   . Chronic fatigue syndrome    Dr. Sabra Heck  . Cough    /SOB, PFT's nl, improved with Bronchodilation 8/11  . Esophageal spasm    GERD Dr. Sabra Heck, Dr. Wynetta Emery; egd 2006 nl; UGI 3/13 Small HH, moderate GERD, nl motility; seen at [facility] ([place]), egd? neg, sone improvement on sucralfate susp  . Fibromyalgia   . Foot pain    Dr. Rushie Nyhan  . High triglycerides   . Hypothyroidism    Dr. Lewis Shock 4/14  . Leukocytoclastic vasculitis ([place])    Dr. Tonia Brooms  . Low HDL (under 40)   . Mitral valve prolapse   . Paroxysmal atrial tachycardia (HCC)    Dr. Marlou Porch  . Pernicious anemia    Dr. Sabra Heck  . Reflux    ? delayed gastric emptying--GES nl 01/2012 (6% at 2hrs)  . RLS (restless legs syndrome)    low ferritin Dr. Sabra Heck  . Uterine carcinoma (HCC)    Dr. Polly Cobia    Past Surgical History:  Procedure Laterality Date  . ABDOMINAL HYSTERECTOMY    . BUNIONECTOMY    . CHOLECYSTECTOMY    . COLONOSCOPY     scr, 12/2008 ([facility]) nl  . COLOSTOMY Left 06/05/2018   Procedure: COLOSTOMY;  Surgeon: Ralene Ok, MD;  Location: [facility];  Service: General;  Laterality: Left;   . HAMMER TOE SURGERY    . LUNG BIOPSY     L Lower lung pulm nodules, largest 4.65mm, low risk, repeat CT in 1 yr now following with pulm at [facility]; 9/13 Stable tiny lung nodules, no f/u needed  . METATARSAL OSTEOTOMY    . NECK SURGERY    . PARTIAL COLECTOMY N/A 06/05/2018   Procedure: EXPLORATORY LAPAROTOMY AND PARTIAL COLECTOMY;  Surgeon: Ralene Ok, MD;  Location: [facility];  Service: General;  Laterality: N/A;  . US ECHOCARDIOGRAPHY     with Nuclear test, Dr. Marlou Porch, Low risk  . VESICOVAGINAL FISTULA CLOSURE W/ TAH      There were no vitals filed for this visit.   Subjective Assessment - 08/08/18 1326    Subjective  Pt reported recent bout of dizziness began the night she got home from the hospital (emergency surgery for Sigmoid volvulus with necrotic bowel on 06/05/18), so she thought it was from medication. Pt was in hospital 06/03/18-06/15/18. She describes the dizziness as spinning, when she would get up from supine position. During first episode, it caused sweating and nausea. Transferring from supine to sit/stand, turning head, bending forward all  incr. dizziness. Resting in seated or supine decr. dizziness. Dizziness at worst: 8/10, at best: 0/10-and it appears to be getting better. While in hospital Metoprolol decr. from 50mg  to 25mg . Dr. Sabra Heck had pt stop Metoprolol and her BP decr. significantly (80s/50s), normal for pt on BP meds was 120s/80s. Pt taking medication again. Intense (spinning) Dizziness lasts approx. < 30sec. but wooziness continues for a while.  Pt has experienced two falls since surgery (when first at home), controlled falls using wall when unsteady. Pt does have tinnitus (but this has been on-going not acute).     Patient is accompained by:  Family member   husband: Engineer, technical sales   Pertinent History  s/p Sigmoid colectomy, colostomy (temporary per pt) on 06/05/18, Chronic fatigue syndrome, HLD, high triglycerides, hypothryoidism, hx of uterine CA, pernicious anemia, fibromyalgia,  asthma, anxiety, chronic LBP, paroxysmal atrial tachycardia, mitral valve prolapse, orthostasis    Patient Stated Goals  I want to get rid of this vertigo feeling.     Currently in Pain?  Yes    Pain Score  --   2-3/10   Pain Location  Head    Pain Orientation  --   across eyes   Pain Descriptors / Indicators  Headache    Pain Type  Acute pain    Pain Onset  More than a month ago    Pain Frequency  Intermittent    Aggravating Factors   unsure    Pain Relieving Factors  Tylenol and keep head still          [facility] PT Assessment - 08/08/18 1339      Assessment   Medical Diagnosis  Vertigo    Referring Provider (PT)  Dr. Kathyrn Lass    Onset Date/Surgical Date  06/15/18    Hand Dominance  Right    Next MD Visit  09/05/18: Dr. Gevena Cotton    Prior Therapy  non for dizziness, just for orthopedic issues and the HHPT/OT s/p surgery.       Precautions   Precautions  Fall;Other (comment)   temporary colostomy bag   Precaution Comments  No lifting over 10 pounds s/p abdominal surgery 06/03/18      Balance Screen   Has the patient fallen in the past 6 months  Yes    How many times?  2    Has the patient had a decrease in activity level because of a fear of falling?   No    Is the patient reluctant to leave their home because of a fear of falling?   No      Home Environment   Living Environment  Private residence    Living Arrangements  Spouse/significant other    Available Help at Discharge  Family;Available PRN/intermittently   pt's husband works part time on weekends   Type of [place] to enter    [employer] of Steps  3    Entrance Stairs-Rails  Cannot reach both    [place]  One level    [place] - single point;Walker - 2 wheels;Walker - 4 wheels;Shower seat;Tub bench;Wheelchair - manual      Prior Function   Level of Independence  Independent    Vocation  On disability    Leisure  Needle work, hand out with friends, play with  dogs, reading      Cognition   Overall Cognitive Status  Within Functional Limits for tasks assessed  Sensation   Additional Comments  Pt denied N/T.       Ambulation/Gait   Ambulation/Gait  Yes    Ambulation/Gait Assistance  5: Supervision    Ambulation/Gait Assistance Details  Pt amb. in guarded manner.    Ambulation Distance (Feet)  100 Feet    Assistive device  None    Gait Pattern  Step-through pattern;Decreased stride length;Decreased trunk rotation    Ambulation Surface  Level;Indoor           Vestibular Assessment - 08/08/18 1355      Symptom Behavior   Type of Dizziness  Spinning   and wooziness   Frequency of Dizziness  Almost daily    Duration of Dizziness  A few seconds of spinning, and then wooziness.     Aggravating Factors  Looking up to the ceiling;Turning head quickly;Turning head sideways;Supine to sit;Sit to stand;Forward bending    Relieving Factors  Rest;Lying supine;Slow movements      Occulomotor Exam   Occulomotor Alignment  Normal    Spontaneous  Absent    Gaze-induced  Absent    Smooth Pursuits  Intact   pt reported nausea during L lower quadrant smooth pursuit   Saccades  Intact    Comment  R HIT (-) and L HIT (+) for wooziness and one refixation saccade.       Vestibulo-Occular Reflex   VOR 1 Head Only (x 1 viewing)  WNL, however, pt reported vertical movements incr. dizziness.           Objective measurements completed on examination: See above findings.              PT Education - 08/08/18 1524    Education Details  PT discussed exam results, POC, frequency and duration.     Person(s) Educated  Patient;Spouse    Methods  Explanation    Comprehension  Verbalized understanding       PT Short Term Goals - 08/08/18 1532      PT SHORT TERM GOAL #1   Title  Pt will be IND in HEP to improve dizziness, balance, and strength. TARGET DATE FOR ALL STGS: 09/05/18    Status  New      PT SHORT TERM GOAL #2   Title  Pt  will amb. 300' over even terrain, while performing head turns/nods, with </=2/10 dizziness, IND.    Status  New      PT SHORT TERM GOAL #3   Title  Perform FGA and write goals as indicated.     Status  New      PT SHORT TERM GOAL #4   Title  Perform positional testing and write goals as indicated.     Status  New        PT Long Term Goals - 08/08/18 1559      PT LONG TERM GOAL #1   Title  Pt will amb. 1000' over even/uneven terrain, while performing dynamic gait activities, IND to improve functional mobility. TARGET DATE FOR LTGS: 10/03/18    Status  New      PT LONG TERM GOAL #2   Title  Pt will report dizziness has decr. to </=3/10 at worst to improve QOL and safety during functional mobility.     Status  New             Plan - 08/08/18 1525    Clinical Impression Statement  Pt is a pleasant 62y/o female presenting to OPPT neuro for dizziness s/p  06/05/18 sigmoid colectomy, colostomy. Pt's PMH significant for the following: s/p Sigmoid colectomy, colostomy (temporary per pt) on 06/05/18, Chronic fatigue syndrome, HLD, high triglycerides, hypothryoidism, hx of uterine CA, pernicious anemia, fibromyalgia, asthma, anxiety, chronic LBP, paroxysmal atrial tachycardia, mitral valve prolapse, and orthostatsis. Pt exam limited 2/2 time constraints as pt has an extensive PMH. Pt's gait gait speed was WNL, however, pt amb. in very guarded manner. 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 Pt also has hx of Cx fusion with decr. L Cx rot/sidebending. PT will assess for positional vertigo and orthostatis next session. PT will also formally assess balance. Pt would benefit from skilled PT to improve safety during functional mobility.     History and Personal Factors relevant to plan of care:  Very IND prior to surgery, on disability 2/2 chronic  fatigue syndrome, husband works part-time on the weekend and assists pt with colostomy bag and ADLs prn, no lifting over 10 pounds (surgical precaution)    Clinical Presentation  Evolving    Clinical Presentation due to:  s/p Sigmoid colectomy, colostomy (temporary per pt) on 06/05/18, Chronic fatigue syndrome, HLD, high triglycerides, hypothryoidism, hx of uterine CA, pernicious anemia, fibromyalgia, asthma, anxiety, chronic LBP, paroxysmal atrial tachycardia, mitral valve prolapse, orthostasis     Clinical Decision Making  Moderate    Rehab Potential  Good    Clinical Impairments Affecting Rehab Potential  see above    PT Frequency  2x / week    PT Duration  8 weeks    PT Treatment/Interventions  ADLs/Self Care Home Management;Biofeedback;Canalith Repostioning;Therapeutic exercise;Therapeutic activities;Functional mobility training;Orthotic Fit/Training;Manual techniques;Vestibular;Stair training;Gait training;Patient/family education;DME Instruction;Neuromuscular re-education;Balance training    PT Next Visit Plan  Perform positional testing and orthstasis testing, and FGA. Provide balance and gaze stab. HEP.     Consulted and Agree with Plan of Care  Patient;Family member/caregiver    Family Member Consulted  Bill: husband.        Patient will benefit from skilled therapeutic intervention in order to improve the following deficits and impairments:  Abnormal gait, Decreased endurance, Decreased knowledge of use of DME, Decreased balance, Decreased mobility, Decreased range of motion, Impaired flexibility, Postural dysfunction, Dizziness, Decreased strength, Pain(Pain will not be directly addressed but monitored closely by PT.)  Visit Diagnosis: Dizziness and giddiness - Plan: PT plan of care cert/re-cert  Other abnormalities of gait and mobility - Plan: PT plan of care cert/re-cert     Problem List Patient Active Problem List   Diagnosis Date Noted  . Sigmoid volvulus ([place]) 06/07/2018   . Bowel perforation ([place]) 06/07/2018  . Anemia 06/07/2018  . Hypokalemia 06/07/2018  . Hypophosphatemia 06/07/2018  . AKI (acute kidney injury) ([place])   . Leukocytosis (leucocytosis) 06/04/2018  . Fecal impaction ([place]) 06/03/2018  . Enterocolitis 06/03/2018  . Asthma 06/03/2018  . Chronic pain syndrome 06/03/2018  . Chronic fatigue syndrome 11/28/2017  . Pernicious anemia 11/28/2017  . 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 Orthostatic hypotension 03/22/2015  . Paroxysmal atrial tachycardia ([place])   . Hypothyroidism 08/09/2012    Alandis Bluemel L 08/08/2018, 4:03 PM  [facility] [address] [place], [place], [address] Phone: [phone]   Fax:  [phone]  Name: [name] MRN: [id number] Date of Birth: [birth year]  Geoffry Paradise, PT,DPT 08/08/18 4:03 PM Phone: [phone] Fax: [phone]

## 2018-08-13 ENCOUNTER — Encounter: Payer: Self-pay | Admitting: Rehabilitative and Restorative Service Providers"

## 2018-08-13 ENCOUNTER — Ambulatory Visit: Payer: Medicare Other | Admitting: Rehabilitative and Restorative Service Providers"

## 2018-08-13 VITALS — BP 92/61 | HR 93

## 2018-08-13 DIAGNOSIS — R42 Dizziness and giddiness: Secondary | ICD-10-CM | POA: Diagnosis not present

## 2018-08-13 DIAGNOSIS — R2689 Other abnormalities of gait and mobility: Secondary | ICD-10-CM

## 2018-08-13 NOTE — Therapy (Signed)
[facility] [address] [name], [place], [address] Phone: [phone]   Fax:  [phone]  Physical Therapy Treatment  Patient Details  Name: [name] MRN: [id number] Date of Birth: 05-Dec-1955 Referring Provider (PT): Dr. Kathyrn Lass   Encounter Date: 08/13/2018  PT End of Session - 08/13/18 0854    Visit Number  2    Number of Visits  17    Date for PT Re-Evaluation  10/07/18    Authorization Type  Medicare and Aetna: PN every 10th visit.     PT Start Time  0849    PT Stop Time  0934    PT Time Calculation (min)  45 min    Equipment Utilized During Treatment  --   S for safety.   Activity Tolerance  Treatment limited secondary to medical complications (Comment)   due to low blood pressure upon rising   Behavior During Therapy  [facility] for tasks assessed/performed       Past Medical History:  Diagnosis Date  . Back pain   . Chronic fatigue syndrome    Dr. Sabra Heck  . Cough    /SOB, PFT's nl, improved with Bronchodilation 8/11  . Esophageal spasm    GERD Dr. Sabra Heck, Dr. Wynetta Emery; egd 2006 nl; UGI 3/13 Small HH, moderate GERD, nl motility; seen at [facility] ([place]), egd? neg, sone improvement on sucralfate susp  . Fibromyalgia   . Foot pain    Dr. Rushie Nyhan  . High triglycerides   . Hypothyroidism    Dr. Lewis Shock 4/14  . Leukocytoclastic vasculitis ([place])    Dr. Tonia Brooms  . Low HDL (under 40)   . Mitral valve prolapse   . Paroxysmal atrial tachycardia (HCC)    Dr. Marlou Porch  . Pernicious anemia    Dr. Sabra Heck  . Reflux    ? delayed gastric emptying--GES nl 01/2012 (6% at 2hrs)  . RLS (restless legs syndrome)    low ferritin Dr. Sabra Heck  . Uterine carcinoma (HCC)    Dr. Polly Cobia    Past Surgical History:  Procedure Laterality Date  . ABDOMINAL HYSTERECTOMY    . BUNIONECTOMY    . CHOLECYSTECTOMY    . COLONOSCOPY     scr, 12/2008 ([facility]) nl  . COLOSTOMY Left 06/05/2018   Procedure: COLOSTOMY;  Surgeon: Ralene Ok, MD;  Location: [facility];  Service: General;  Laterality: Left;  . HAMMER TOE SURGERY    . LUNG BIOPSY     L Lower lung pulm nodules, largest 4.85mm, low risk, repeat CT in 1 yr now following with pulm at [facility]; 9/13 Stable tiny lung nodules, no f/u needed  . METATARSAL OSTEOTOMY    . NECK SURGERY    . PARTIAL COLECTOMY N/A 06/05/2018   Procedure: EXPLORATORY LAPAROTOMY AND PARTIAL COLECTOMY;  Surgeon: Ralene Ok, MD;  Location: [facility];  Service: General;  Laterality: N/A;  . US ECHOCARDIOGRAPHY     with Nuclear test, Dr. Marlou Porch, Low risk  . VESICOVAGINAL FISTULA CLOSURE W/ TAH      Vitals:   08/13/18 0923  BP: 92/61  Pulse: 93    Subjective Assessment - 08/13/18 0851    Subjective  The patient reports "I've had a couple of good days, but yesterday was worse."  She notes when she moves her head down and returns to upright, she gets dizziness.  She reports a "breathless" sensation like "my eyes are weird and my head feels funny like the world is getting a little smaller."  Symptoms are improved by sitting and resting.    She notes "I feel weird afterwards."   The patient notes awareness of imbalance.    Pertinent History  s/p Sigmoid colectomy, colostomy (temporary per pt) on 06/05/18, Chronic fatigue syndrome, HLD, high triglycerides, hypothryoidism, hx of uterine CA, pernicious anemia, fibromyalgia, asthma, anxiety, chronic LBP, paroxysmal atrial tachycardia, mitral valve prolapse, orthostasis    Patient Stated Goals  I want to get rid of this vertigo feeling.     Currently in Pain?  No/denies   "I feel a little breathless this morning"        [facility] PT Assessment - 08/13/18 0900      ROM / Strength   AROM / PROM / Strength  AROM      AROM   AROM Assessment Site  Cervical    Cervical Flexion  20    Cervical Extension  12    Cervical - Right Side Bend  20    Cervical - Left Side Bend  12    Cervical - Right Rotation  48    Cervical - Left Rotation  42          Vestibular Assessment - 08/13/18 0857      Vestibular Assessment   General Observation  Patient walks into clinic moving en bloc.  Patient notes increased symptoms when she gets up quickly and begins walking (after rising from bed).        Positional Testing   Dix-Hallpike  Dix-Hallpike Right;Dix-Hallpike Left    Sidelying Test  --    Horizontal Canal Testing  Horizontal Canal Right;Horizontal Canal Left      Dix-Hallpike Right   Dix-Hallpike Right Duration  none   modified using a pillow under upper back and supported neck   Dix-Hallpike Right Symptoms  No nystagmus      Dix-Hallpike Left   Dix-Hallpike Left Duration  none    Dix-Hallpike Left Symptoms  No nystagmus      Horizontal Canal Right   Horizontal Canal Right Duration  None   had patient roll due to c-spine rotation limitations   Horizontal Canal Right Symptoms  Normal      Horizontal Canal Left   Horizontal Canal Left Duration  none    Horizontal Canal Left Symptoms  Normal               [facility] Adult PT Treatment/Exercise - 08/13/18 1128      Self-Care   Self-Care  Other Self-Care Comments    Other Self-Care Comments   Educated patient using English as a second language teacher from [employer] on orthostatic hypotension.  She has 2 types of synptoms:  had days of spinning type dizziness and imbalance (more consistent with hypofunction from neuritis) and has ongoing "heaviness" in head after getting up (more consistent with orthostasis).  Discussed monitoring BP and moving slower upon rising.      Vestibular Treatment/Exercise - 08/13/18 1126      Vestibular Treatment/Exercise   Vestibular Treatment Provided  Habituation;Gaze    Habituation Exercises  Seated Horizontal Head Turns    Gaze Exercises  X1 Viewing Horizontal      Seated Horizontal Head Turns   Symptom Description   *educated patient that VOR x 1 adaptation would be helping with behavioral strategies of dec'ing neck ROM to move en bloc.  Using gaze as form  of habituation, as well as adaptation.      X1 Viewing Horizontal   Foot Position  seated > attempted to progress  to standing, however the patient reports "I have to sit down" and BP reading was low.      Reps  2    Comments  Began with 30 seconds seated, attempted standing, however BP dropped.  After resting, performed in seated position x 60 seconds with cues on speed, portion of progressive lens to view letter, and purpose for in HEP.            PT Education - 08/13/18 1124    Education Details  seated gaze x 1 adaptation exercises; handout from [place] toolkit on orthostatic hypotension    Person(s) Educated  Patient    Methods  Explanation;Demonstration;Handout    Comprehension  Returned demonstration;Verbalized understanding       PT Short Term Goals - 08/13/18 1126      PT SHORT TERM GOAL #1   Title  Pt will be IND in HEP to improve dizziness, balance, and strength. TARGET DATE FOR ALL STGS: 09/05/18    Status  New      PT SHORT TERM GOAL #2   Title  Pt will amb. 300' over even terrain, while performing head turns/nods, with </=2/10 dizziness, IND.    Status  New      PT SHORT TERM GOAL #3   Title  Perform FGA and write goals as indicated.     Status  New      PT SHORT TERM GOAL #4   Title  Perform positional testing and write goals as indicated.     Baseline  No evidence of positional vertigo on 08/13/18 *see objective portion of note.    Status  Achieved        PT Long Term Goals - 08/08/18 1559      PT LONG TERM GOAL #1   Title  Pt will amb. 1000' over even/uneven terrain, while performing dynamic gait activities, IND to improve functional mobility. TARGET DATE FOR LTGS: 10/03/18    Status  New      PT LONG TERM GOAL #2   Title  Pt will report dizziness has decr. to </=3/10 at worst to improve QOL and safety during functional mobility.     Status  New            Plan - 08/13/18 0909    Clinical Impression Statement  Patient reports a  "heaviness" throughout her shoulders and back of neck within seconts to a minute after rising.  PT was planning on assessing balance further, however due to low blood pressure deferred until next session.  Patient did not experience dizziness or nystagmus with positional testing for BPPV -- signs and symptoms more consistent with multifactorial dizziness including possible hypofunction and orthoastasis.  PT to address hypofunction with vestibular adaptation, habituation activities to promote improved mobility.  PT to monitor BP and patient subjective reports in standing due to low BP readings today.     PT Treatment/Interventions  ADLs/Self Care Home Management;Biofeedback;Canalith Repostioning;Therapeutic exercise;Therapeutic activities;Functional mobility training;Orthotic Fit/Training;Manual techniques;Vestibular;Stair training;Gait training;Patient/family education;DME Instruction;Neuromuscular re-education;Balance training    PT Next Visit Plan  Check gaze x 1; assess balance (monitor BP in standing initially); add balance HEP to tolerance.   patient subjective reports consistent with orthostasis + low BP reading today.     Consulted and Agree with Plan of Care  Patient       Patient will benefit from skilled therapeutic intervention in order to improve the following deficits and impairments:  Abnormal gait, Decreased endurance, Decreased knowledge of use of  DME, Decreased balance, Decreased mobility, Decreased range of motion, Impaired flexibility, Postural dysfunction, Dizziness, Decreased strength, Pain  Visit Diagnosis: Dizziness and giddiness  Other abnormalities of gait and mobility     Problem List Patient Active Problem List   Diagnosis Date Noted  . Sigmoid volvulus ([place]) 06/07/2018  . Bowel perforation ([place]) 06/07/2018  . Anemia 06/07/2018  . Hypokalemia 06/07/2018  . Hypophosphatemia 06/07/2018  . AKI (acute kidney injury) ([place])   . Leukocytosis (leucocytosis) 06/04/2018  .  Fecal impaction ([place]) 06/03/2018  . Enterocolitis 06/03/2018  . Asthma 06/03/2018  . Chronic pain syndrome 06/03/2018  . Chronic fatigue syndrome 11/28/2017  . Pernicious anemia 11/28/2017  . 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 Orthostatic hypotension 03/22/2015  . Paroxysmal atrial tachycardia ([place])   . Hypothyroidism 08/09/2012    Makiah Foye , PT 08/13/2018, 11:33 AM  [facility] [address] [place], [place], [address] Phone: [phone]   Fax:  [phone]  Name: [name] MRN: [id number] Date of Birth: Jan 13, 1956

## 2018-08-13 NOTE — Patient Instructions (Addendum)
Gaze Stabilization: Sitting    Keeping eyes on target on wall 3 feet away, and move head side to side for _60___ seconds. *only side to side.  Do __2-3__ sessions per day.  Copyright  VHI. All rights reserved.   Gaze Stabilization: Tip Card  1.Target must remain in focus, not blurry, and appear stationary while head is in motion. 2.Perform exercises with small head movements (45 to either side of midline). 3.Increase speed of head motion so long as target is in focus. 4.If you wear eyeglasses, be sure you can see target through lens (therapist will give specific instructions for bifocal / progressive lenses). 5.These exercises may provoke dizziness or nausea. Work through these symptoms. If too dizzy, slow head movement slightly. Rest between each exercise. 6.Exercises demand concentration; avoid distractions.  Copyright  VHI. All rights reserved.

## 2018-08-14 DIAGNOSIS — D510 Vitamin B12 deficiency anemia due to intrinsic factor deficiency: Secondary | ICD-10-CM | POA: Diagnosis not present

## 2018-08-14 DIAGNOSIS — I10 Essential (primary) hypertension: Secondary | ICD-10-CM | POA: Diagnosis not present

## 2018-08-14 DIAGNOSIS — K529 Noninfective gastroenteritis and colitis, unspecified: Secondary | ICD-10-CM | POA: Diagnosis not present

## 2018-08-14 DIAGNOSIS — Z433 Encounter for attention to colostomy: Secondary | ICD-10-CM | POA: Diagnosis not present

## 2018-08-14 DIAGNOSIS — K5780 Diverticulitis of intestine, part unspecified, with perforation and abscess without bleeding: Secondary | ICD-10-CM | POA: Diagnosis not present

## 2018-08-14 DIAGNOSIS — K589 Irritable bowel syndrome without diarrhea: Secondary | ICD-10-CM | POA: Diagnosis not present

## 2018-08-20 ENCOUNTER — Encounter: Payer: Self-pay | Admitting: Rehabilitative and Restorative Service Providers"

## 2018-08-20 ENCOUNTER — Ambulatory Visit: Payer: Medicare Other | Admitting: Rehabilitative and Restorative Service Providers"

## 2018-08-20 VITALS — BP 110/78 | HR 89

## 2018-08-20 DIAGNOSIS — R2689 Other abnormalities of gait and mobility: Secondary | ICD-10-CM | POA: Diagnosis not present

## 2018-08-20 DIAGNOSIS — R42 Dizziness and giddiness: Secondary | ICD-10-CM | POA: Diagnosis not present

## 2018-08-20 NOTE — Patient Instructions (Signed)
Heel Raises    Stand with support. Tighten pelvic floor and hold. With knees straight, raise heels off ground. Hold _20__ seconds. Do __1-2_ times a day.  Copyright  VHI. All rights reserved.   ANKLE: Dorsiflexion - Standing    Stand with upright posture. Raise toes of both feet up at same time. _20__ reps per set.  1-2 times/day. Hold onto a support.  Copyright  VHI. All rights reserved.   Mini-Squats (Standing)    Stand with support. Bend knees slightly. Tighten pelvic floor. Hold for __3_ seconds. Return to straight standing.  Repeat _10_ times. Do _1-2__ times a day.  Copyright  VHI. All rights reserved.

## 2018-08-20 NOTE — Therapy (Signed)
[facility] [address] [place], [place], [address] Phone: [phone]   Fax:  [phone]  Physical Therapy Treatment  Patient Details  Name: [name] MRN: [id number] Date of Birth: [birth year] Referring Provider (PT): Dr. Kathyrn Lass   Encounter Date: 08/20/2018  PT End of Session - 08/20/18 0944    Visit Number  3    Number of Visits  17    Date for PT Re-Evaluation  10/07/18    Authorization Type  Medicare and Aetna: PN every 10th visit.     PT Start Time  [phone]    PT Stop Time  0933    PT Time Calculation (min)  44 min    Equipment Utilized During Treatment  Gait belt    Activity Tolerance  Treatment limited secondary to medical complications (Comment)   due to low blood pressure upon rising   Behavior During Therapy  [facility] for tasks assessed/performed       Past Medical History:  Diagnosis Date  . Back pain   . Chronic fatigue syndrome    Dr. Sabra Heck  . Cough    /SOB, PFT's nl, improved with Bronchodilation 8/11  . Esophageal spasm    GERD Dr. Sabra Heck, Dr. Wynetta Emery; egd 2006 nl; UGI 3/13 Small HH, moderate GERD, nl motility; seen at [facility] ([place]), egd? neg, sone improvement on sucralfate susp  . Fibromyalgia   . Foot pain    Dr. Rushie Nyhan  . High triglycerides   . Hypothyroidism    Dr. Lewis Shock 4/14  . 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 Tonia Brooms  . Low HDL (under 40)   . Mitral valve prolapse   . Paroxysmal atrial tachycardia (HCC)    Dr. Marlou Porch  . Pernicious anemia    Dr. Sabra Heck  . Reflux    ? delayed gastric emptying--GES nl 01/2012 (6% at 2hrs)  . RLS (restless legs syndrome)    low ferritin Dr. Sabra Heck  . Uterine carcinoma (HCC)    Dr. Polly Cobia    Past Surgical History:  Procedure Laterality Date  . ABDOMINAL HYSTERECTOMY    . BUNIONECTOMY    . CHOLECYSTECTOMY    . COLONOSCOPY     scr, 12/2008 ([facility]) nl  . COLOSTOMY Left 06/05/2018   Procedure: COLOSTOMY;  Surgeon: Ralene Ok,  MD;  Location: [facility];  Service: General;  Laterality: Left;  . HAMMER TOE SURGERY    . LUNG BIOPSY     L Lower lung pulm nodules, largest 4.65mm, low risk, repeat CT in 1 yr now following with pulm at [facility]; 9/13 Stable tiny lung nodules, no f/u needed  . METATARSAL OSTEOTOMY    . NECK SURGERY    . PARTIAL COLECTOMY N/A 06/05/2018   Procedure: EXPLORATORY LAPAROTOMY AND PARTIAL COLECTOMY;  Surgeon: Ralene Ok, MD;  Location: [facility];  Service: General;  Laterality: N/A;  . US ECHOCARDIOGRAPHY     with Nuclear test, Dr. Marlou Porch, Low risk  . VESICOVAGINAL FISTULA CLOSURE W/ TAH      Vitals:   08/20/18 0858  BP: 110/78  Pulse: 89  SpO2: 96%    Subjective Assessment - 08/20/18 0849    Subjective  The patient reports that she had food poisoning last week on Wed-Thurs.  She also has a UTI and is awaiting a phone call back from her physician.    She began feeling better from food poisoning over the weekend and then began with UTI symptoms on Sunday.  She notes she is  feeling weak.  She notes she didn't get to do "homework" because of illness since last visit.  She notices that she gets a sensation of tingling and difficulty breathing after transitioning from sitting to walking.     Pertinent History  s/p Sigmoid colectomy, colostomy (temporary per pt) on 06/05/18, Chronic fatigue syndrome, HLD, high triglycerides, hypothryoidism, hx of uterine CA, pernicious anemia, fibromyalgia, asthma, anxiety, chronic LBP, paroxysmal atrial tachycardia, mitral valve prolapse, orthostasis    Patient Stated Goals  I want to get rid of this vertigo feeling.     Currently in Pain?  No/denies         [facility] PT Assessment - 08/20/18 0901      Ambulation/Gait   Ambulation/Gait  Yes    Ambulation/Gait Assistance  5: Supervision    Ambulation Distance (Feet)  150 Feet    Assistive device  None    Gait Pattern  Step-through pattern;Decreased stride length;Decreased trunk rotation    Gait velocity  2.53 ft/sec       Functional Gait  Assessment   Gait assessed   Yes    Gait Level Surface  Walks 20 ft, slow speed, abnormal gait pattern, evidence for imbalance or deviates 10-15 in outside of the 12 in walkway width. Requires more than 7 sec to ambulate 20 ft.    Change in Gait Speed  Able to smoothly change walking speed without loss of balance or gait deviation. Deviate no more than 6 in outside of the 12 in walkway width.    Gait with Horizontal Head Turns  Performs head turns with moderate changes in gait velocity, slows down, deviates 10-15 in outside 12 in walkway width but recovers, can continue to walk.    Gait with Vertical Head Turns  Performs task with severe disruption of gait (eg, staggers 15 in outside 12 in walkway width, loses balance, stops, reaches for wall).    Gait and Pivot Turn  Pivot turns safely within 3 sec and stops quickly with no loss of balance.    Step Over Obstacle  Is able to step over one shoe box (4.5 in total height) without changing gait speed. No evidence of imbalance.    Gait with Narrow Base of Support  Ambulates 4-7 steps.    Gait with Eyes Closed  Walks 20 ft, slow speed, abnormal gait pattern, evidence for imbalance, deviates 10-15 in outside 12 in walkway width. Requires more than 9 sec to ambulate 20 ft.    Ambulating Backwards  Walks 20 ft, uses assistive device, slower speed, mild gait deviations, deviates 6-10 in outside 12 in walkway width.    Steps  Alternating feet, must use rail.    Total Score  16    FGA comment:  16/30                   [facility] Adult PT Treatment/Exercise - 08/20/18 0901      Self-Care   Self-Care  Other Self-Care Comments    Other Self-Care Comments   Discussed initiating a HEP on her front porch because it is level and she has a long walking path with chairs nearby.  Her driveway has an incline.  PT discussed short distance walking intermittently t/o the day.      Exercises   Exercises  Other Exercises    Other Exercises    General conditioning:  heel raises x 20 reps near counter without support, toe raises x 20 reps with UE support on counter, and mini  squats x 10 reps with UE support.  Patient needs to rest 2 times during these exercises (chair nearby).              PT Education - 08/20/18 0948    Education Details  General conditioning:  heel/toe raises and mini squats    Person(s) Educated  Patient    Methods  Explanation;Demonstration    Comprehension  Verbalized understanding;Returned demonstration       PT Short Term Goals - 08/20/18 0948      PT SHORT TERM GOAL #1   Title  Pt will be IND in HEP to improve dizziness, balance, and strength. TARGET DATE FOR ALL STGS: 09/05/18    Status  New      PT SHORT TERM GOAL #2   Title  Pt will amb. 300' over even terrain, while performing head turns/nods, with </=2/10 dizziness, IND.    Status  New      PT SHORT TERM GOAL #3   Title  Perform FGA and write goals as indicated.     Baseline  Baseline FGA is 16/30.--see LTG for goals.    Status  Achieved      PT SHORT TERM GOAL #4   Title  Perform positional testing and write goals as indicated.     Baseline  No evidence of positional vertigo on 08/13/18 *see objective portion of note.    Status  Achieved        PT Long Term Goals - 08/20/18 0951      PT LONG TERM GOAL #1   Title  Pt will amb. 1000' over even/uneven terrain, while performing dynamic gait activities, IND to improve functional mobility. TARGET DATE FOR LTGS: 10/03/18    Status  New      PT LONG TERM GOAL #2   Title  Pt will report dizziness has decr. to </=3/10 at worst to improve QOL and safety during functional mobility.     Status  New      PT LONG TERM GOAL #3   Title  The patient will improve FGA From 16/30 up to 21/30 to demo improving safety during dynamic gait activities.    Status  New    Target Date  10/03/18            Plan - 08/20/18 1005    Clinical Impression Statement  The patient notes general  deconditioning and weakness that hinder mobility.  PT initiated a HEP directed at general strengthening in LEs,  She scores high fall risk on FGA and needed to rest x 5 times t/o FGA Test due to general LE weakness and sensation knees could buckle.  PT recommended we focus on general conditioning to improve tolerance to activity.  Patient's vitals were monitored t/o with WNLs readings on BP, spO2 and HR.      PT Treatment/Interventions  ADLs/Self Care Home Management;Biofeedback;Canalith Repostioning;Therapeutic exercise;Therapeutic activities;Functional mobility training;Orthotic Fit/Training;Manual techniques;Vestibular;Stair training;Gait training;Patient/family education;DME Instruction;Neuromuscular re-education;Balance training    PT Next Visit Plan  Check HEP, add balance activities (partial turns or standing head motion in corner), monitor vitals.    Consulted and Agree with Plan of Care  Patient       Patient will benefit from skilled therapeutic intervention in order to improve the following deficits and impairments:  Abnormal gait, Decreased endurance, Decreased knowledge of use of DME, Decreased balance, Decreased mobility, Decreased range of motion, Impaired flexibility, Postural dysfunction, Dizziness, Decreased strength, Pain  Visit Diagnosis: Dizziness and giddiness  Other  abnormalities of gait and mobility     Problem List Patient Active Problem List   Diagnosis Date Noted  . 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 Bowel perforation ([place]) 06/07/2018  . Anemia 06/07/2018  . Hypokalemia 06/07/2018  . Hypophosphatemia 06/07/2018  . AKI (acute kidney injury) ([place])   . Leukocytosis (leucocytosis) 06/04/2018  . Fecal impaction ([place]) 06/03/2018  . Enterocolitis 06/03/2018  . Asthma 06/03/2018  . Chronic pain syndrome 06/03/2018  . Chronic fatigue syndrome 11/28/2017  . Pernicious anemia 11/28/2017  . 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 Orthostatic hypotension 03/22/2015  .  Paroxysmal atrial tachycardia ([place])   . Hypothyroidism 08/09/2012    Jamela Cumbo, PT 08/20/2018, 10:07 AM  [facility] [address] [place], [place], [address] Phone: [phone]   Fax:  [phone]  Name: [name] MRN: [id number] Date of Birth: [birth year]

## 2018-08-22 ENCOUNTER — Ambulatory Visit: Payer: Medicare Other

## 2018-08-27 ENCOUNTER — Ambulatory Visit: Payer: Medicare Other

## 2018-08-27 DIAGNOSIS — R2689 Other abnormalities of gait and mobility: Secondary | ICD-10-CM | POA: Diagnosis not present

## 2018-08-27 DIAGNOSIS — R42 Dizziness and giddiness: Secondary | ICD-10-CM

## 2018-08-27 NOTE — Therapy (Signed)
[facility] [address] [place], [place], [address] Phone: [phone]   Fax:  [phone]  Physical Therapy Treatment  Patient Details  Name: [name] MRN: [id number] Date of Birth: [birth year] Referring Provider (PT): Dr. Kathyrn Lass   Encounter Date: 08/27/2018  PT End of Session - 08/27/18 1014    Visit Number  4    Number of Visits  17    Date for PT Re-Evaluation  10/07/18    Authorization Type  Medicare and Aetna: PN every 10th visit.     PT Start Time  [phone]    PT Stop Time  1013    PT Time Calculation (min)  40 min    Equipment Utilized During Treatment  --   S prn   Activity Tolerance  Patient tolerated treatment well    Behavior During Therapy  WFL for tasks assessed/performed       Past Medical History:  Diagnosis Date  . Back pain   . Chronic fatigue syndrome    Dr. Sabra Heck  . Cough    /SOB, PFT's nl, improved with Bronchodilation 8/11  . Esophageal spasm    GERD Dr. Sabra Heck, Dr. Wynetta Emery; egd 2006 nl; UGI 3/13 Small HH, moderate GERD, nl motility; seen at [facility] ([place]), egd? neg, sone improvement on sucralfate susp  . Fibromyalgia   . Foot pain    Dr. Rushie Nyhan  . High triglycerides   . Hypothyroidism    Dr. Lewis Shock 4/14  . Leukocytoclastic vasculitis ([place])    Dr. Tonia Brooms  . Low HDL (under 40)   . Mitral valve prolapse   . Paroxysmal atrial tachycardia (HCC)    Dr. Marlou Porch  . Pernicious anemia    Dr. Sabra Heck  . Reflux    ? delayed gastric emptying--GES nl 01/2012 (6% at 2hrs)  . RLS (restless legs syndrome)    low ferritin Dr. Sabra Heck  . Uterine carcinoma (HCC)    Dr. Polly Cobia    Past Surgical History:  Procedure Laterality Date  . ABDOMINAL HYSTERECTOMY    . BUNIONECTOMY    . CHOLECYSTECTOMY    . COLONOSCOPY     scr, 12/2008 ([facility]) nl  . COLOSTOMY Left 06/05/2018   Procedure: COLOSTOMY;  Surgeon: Ralene Ok, MD;  Location: [facility];  Service: General;  Laterality: Left;  .  HAMMER TOE SURGERY    . LUNG BIOPSY     L Lower lung pulm nodules, largest 4.62m, low risk, repeat CT in 1 yr [facility] Stable tiny lung nodules, no f/u needed  . METATARSAL OSTEOTOMY    . NECK SURGERY    . PARTIAL COLECTOMY N/A 06/05/2018   Procedure: EXPLORATORY LAPAROTOMY AND PARTIAL COLECTOMY;  Surgeon: RRalene Ok MD;  [place] [facility]  Service: General;  Laterality: N/A;  . UKoreaECHOCARDIOGRAPHY     with Nuclear test, Dr. SMarlou Porch Low risk  . VESICOVAGINAL FISTULA CLOSURE W/ TAH      There were no vitals filed for this visit.  Subjective Assessment - 08/27/18 0936    Subjective  Pt's dizziness has been improving but she's having some issues when she eats. Her surgeon put her on a bland diet until she sees him on 09/05/18. Pt still experiences dizziness during sit to stand txf. Pt has noticed she doesn't take deep breaths, she's a shallow breather, and she feels like this incr. dizziness/lightheadedness.     Pertinent History  s/p Sigmoid colectomy, colostomy (temporary per pt) on 06/05/18,  Chronic fatigue syndrome, HLD, high triglycerides, hypothryoidism, hx of uterine CA, pernicious anemia, fibromyalgia, asthma, anxiety, chronic LBP, paroxysmal atrial tachycardia, mitral valve prolapse, orthostasis    Patient Stated Goals  I want to get rid of this vertigo feeling.     Currently in Pain?  No/denies             Vestibular Assessment - 08/27/18 0939      Orthostatics   BP supine (x 5 minutes)  130/68    HR supine (x 5 minutes)  79    BP sitting  131/58    HR sitting  84    BP standing (after 1 minute)  108/58    HR standing (after 1 minute)  94    Orthostatics Comment  Lightheadedness reported during supine to sit and maintained through sit to stand txf. (2/10 lightheadedness).         Neuro re-ed and therex (squats): Gaze Stabilization: Sitting    Keeping eyes on target on wall 3 feet away, and move head side to side for _60___ seconds.  *only side to side. PROGRESS BY INCREASING SPEED AS TOLERATED.  Do __2-3__ sessions per Jill.  Copyright  VHI. All rights reserved.   Gaze Stabilization: Tip Card  1.Target must remain in focus, not blurry, and appear stationary while head is in motion. 2.Perform exercises with small head movements (45 to either side of midline). 3.Increase speed of head motion so long as target is in focus. 4.If you wear eyeglasses, be sure you can see target through lens (therapist will give specific instructions for bifocal / progressive lenses). 5.These exercises may provoke dizziness or nausea. Work through these symptoms. If too dizzy, slow head movement slightly. Rest between each exercise. 6.Exercises demand concentration; avoid distractions.  Copyright  VHI. All rights reserved.    Heel Raises    Stand with support. Tighten pelvic floor and hold. With knees straight, raise heels off ground. Hold _1-2__ seconds. Repeat 20 times. Do __1-2_ times a Jill. PROGRESS TO HOVERING HANDS AT COUNTER AS TOLERATED.  Copyright  VHI. All rights reserved.   ANKLE: Dorsiflexion - Standing    Stand with upright posture. Raise toes of both feet up at same time. HOLD COUNTER WITH ONE HAND, PROGRESS TO HOVERING HANDS AT COUNTER AS TOLERATED. _20__ reps per set.  1-2 times/Jill. Hold onto a support.  Copyright  VHI. All rights reserved.   Mini-Squats (Standing)    Stand with support. Bend knees slightly. Tighten pelvic floor. Hold for __3_ seconds. Return to straight standing, BY SQUEEZING BUTTOCK MUSCLES TOGETHER. MAKE SURE KNEES DO NOT GO OVER TOES.   Repeat _10_ times. Do _1-2__ times a Jill.  Copyright  VHI. All rights reserved.  Pt tolerated HEP well with cues for progression. No c/o pain. Mild dizziness 2-3/10 during gaze stabilization.        [facility] Adult PT Treatment/Exercise - 08/27/18 0951      Ambulation/Gait   Ambulation/Gait  Yes    Ambulation/Gait Assistance  7: Independent     Ambulation/Gait Assistance Details  Pt amb. in slightly guarded manner, but no dizziness reported during head turns/nods. Incr. postural sway noted but able to self correct.     Ambulation Distance (Feet)  345 Feet    Assistive device  None    Gait Pattern  Step-through pattern;Decreased stride length;Decreased trunk rotation    Ambulation Surface  Level;Indoor             PT Education - 08/27/18 1013  Education Details  PT discussed goal progress and HEP progression. PT educated pt on orthostatic hypotension testing results and how to ensure safety when lightheaded.     Person(s) Educated  Patient    Methods  Explanation;Verbal cues;Handout    Comprehension  Verbalized understanding;Returned demonstration       PT Short Term Goals - 08/27/18 1016      PT SHORT TERM GOAL #1   Title  Pt will be IND in HEP to improve dizziness, balance, and strength. TARGET DATE FOR ALL STGS: 09/05/18    Status  Achieved      PT SHORT TERM GOAL #2   Title  Pt will amb. 300' over even terrain, while performing head turns/nods, with </=2/10 dizziness, IND.    Status  Achieved      PT SHORT TERM GOAL #3   Title  Perform FGA and write goals as indicated.     Baseline  Baseline FGA is 16/30.--see LTG for goals.    Status  Achieved      PT SHORT TERM GOAL #4   Title  Perform positional testing and write goals as indicated.     Baseline  No evidence of positional vertigo on 08/13/18 *see objective portion of note.    Status  Achieved        PT Long Term Goals - 08/20/18 0951      PT LONG TERM GOAL #1   Title  Pt will amb. 1000' over even/uneven terrain, while performing dynamic gait activities, IND to improve functional mobility. TARGET DATE FOR LTGS: 10/03/18    Status  New      PT LONG TERM GOAL #2   Title  Pt will report dizziness has decr. to </=3/10 at worst to improve QOL and safety during functional mobility.     Status  New      PT LONG TERM GOAL #3   Title  The patient will  improve FGA From 16/30 up to 21/30 to demo improving safety during dynamic gait activities.    Status  New    Target Date  10/03/18            Plan - 08/27/18 1014    Clinical Impression Statement  Pt demonstrated progress as she met STGs 1 and 2. Pt tolerated HEP progression well, with only mild dizziness reported. Pt did require one UE support and S during toe raises to ensure safety, as pt experienced LOB during toe raises without UE support. Pt's BP did significantly decr. during orthostatic hypotension testing, indicating orthostatis. Pt's dizziness is likely mulit-factorial and pt would continue to benefit from skilled PT to improve safety during functional mobility.     PT Treatment/Interventions  ADLs/Self Care Home Management;Biofeedback;Canalith Repostioning;Therapeutic exercise;Therapeutic activities;Functional mobility training;Orthotic Fit/Training;Manual techniques;Vestibular;Stair training;Gait training;Patient/family education;DME Instruction;Neuromuscular re-education;Balance training    PT Next Visit Plan   add balance activities (partial turns or standing head motion in corner), monitor vitals.    Consulted and Agree with Plan of Care  Patient       Patient will benefit from skilled therapeutic intervention in order to improve the following deficits and impairments:  Abnormal gait, Decreased endurance, Decreased knowledge of use of DME, Decreased balance, Decreased mobility, Decreased range of motion, Impaired flexibility, Postural dysfunction, Dizziness, Decreased strength, Pain  Visit Diagnosis: Dizziness and giddiness  Other abnormalities of gait and mobility     Problem List Patient Active Problem List   Diagnosis Date Noted  . 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  Bowel perforation ([place]) 06/07/2018  . Anemia 06/07/2018  . Hypokalemia 06/07/2018  . Hypophosphatemia 06/07/2018  . AKI (acute kidney injury) ([place])   . Leukocytosis (leucocytosis) 06/04/2018  .  Fecal impaction ([place]) 06/03/2018  . Enterocolitis 06/03/2018  . Asthma 06/03/2018  . Chronic pain syndrome 06/03/2018  . Chronic fatigue syndrome 11/28/2017  . Pernicious anemia 11/28/2017  . 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 Orthostatic hypotension 03/22/2015  . Paroxysmal atrial tachycardia ([place])   . Hypothyroidism 08/09/2012    Marayah Higdon L 08/27/2018, 10:18 AM  [facility] [address] [place], [place], [address] Phone: [phone]   Fax:  [phone]  Name: AMIE COWENS MRN: [id number] Date of Birth: [birth year]  Geoffry Paradise, PT,DPT 08/27/18 10:19 AM Phone: [phone] Fax: [phone]

## 2018-08-27 NOTE — Patient Instructions (Signed)
Gaze Stabilization: Sitting    Keeping eyes on target on wall 3 feet away, and move head side to side for _60___ seconds. *only side to side. PROGRESS BY INCREASING SPEED AS TOLERATED.  Do __2-3__ sessions per day.  Copyright  VHI. All rights reserved.   Gaze Stabilization: Tip Card  1.Target must remain in focus, not blurry, and appear stationary while head is in motion. 2.Perform exercises with small head movements (45 to either side of midline). 3.Increase speed of head motion so long as target is in focus. 4.If you wear eyeglasses, be sure you can see target through lens (therapist will give specific instructions for bifocal / progressive lenses). 5.These exercises may provoke dizziness or nausea. Work through these symptoms. If too dizzy, slow head movement slightly. Rest between each exercise. 6.Exercises demand concentration; avoid distractions.  Copyright  VHI. All rights reserved.    Heel Raises    Stand with support. Tighten pelvic floor and hold. With knees straight, raise heels off ground. Hold _1-2__ seconds. Repeat 20 times. Do __1-2_ times a day. PROGRESS TO HOVERING HANDS AT COUNTER AS TOLERATED.  Copyright  VHI. All rights reserved.   ANKLE: Dorsiflexion - Standing    Stand with upright posture. Raise toes of both feet up at same time. HOLD COUNTER WITH ONE HAND, PROGRESS TO HOVERING HANDS AT COUNTER AS TOLERATED. _20__ reps per set.  1-2 times/day. Hold onto a support.  Copyright  VHI. All rights reserved.   Mini-Squats (Standing)    Stand with support. Bend knees slightly. Tighten pelvic floor. Hold for __3_ seconds. Return to straight standing, BY SQUEEZING BUTTOCK MUSCLES TOGETHER. MAKE SURE KNEES DO NOT GO OVER TOES.   Repeat _10_ times. Do _1-2__ times a day.  Copyright  VHI. All rights reserved.

## 2018-08-29 ENCOUNTER — Ambulatory Visit: Payer: Medicare Other

## 2018-08-29 VITALS — BP 122/72

## 2018-08-29 DIAGNOSIS — R2689 Other abnormalities of gait and mobility: Secondary | ICD-10-CM

## 2018-08-29 DIAGNOSIS — R42 Dizziness and giddiness: Secondary | ICD-10-CM | POA: Diagnosis not present

## 2018-08-29 NOTE — Patient Instructions (Addendum)
  Single Leg - Eyes Open    Holding support, lift right leg while maintaining balance over other leg. Progress to removing hands from support surface for longer periods of time. Hold__10-30__ seconds. Repeat __3__ times per session per leg. Do __1__ sessions per day.  Copyright  VHI. All rights reserved.  Feet Heel-Toe "Tandem", Varied Arm Positions - Eyes Open    With eyes open, right foot directly in front of the other, arms at your side, look straight ahead at a stationary object. Hold __30__ seconds. Repeat __3__ times per session per leg. Do __1__ sessions per day.  Copyright  VHI. All rights reserved.  Feet Together (Compliant Surface) Varied Arm Positions - Eyes Closed    Stand on compliant surface: __pillows/cushions______ with feet together and arms at your side. Close eyes and visualize upright position. Hold__30__ seconds. Repeat __3__ times per session. Do __1__ sessions per day.  Copyright  VHI. All rights reserved.  Feet Together (Compliant Surface) Head Motion - Eyes Closed    Stand on compliant surface: __pillows/cushions______ with feet together. Close eyes and move head slowly, up and down 5 times and side to side 5 times. Repeat __3__ times per session. Do __1__ sessions per day.  Copyright  VHI. All rights reserved.

## 2018-08-29 NOTE — Therapy (Signed)
[facility] [address] [place], [place], [address] Phone: [phone]   Fax:  [phone]  Physical Therapy Treatment  Patient Details  Name: [name] MRN: [id number] Date of Birth: [birth year] Referring Provider (PT): Dr. Kathyrn Lass   Encounter Date: 08/29/2018  PT End of Session - 08/29/18 1637    Visit Number  5    Number of Visits  17    Date for PT Re-Evaluation  10/07/18    Authorization Type  Medicare and [employer]et[employer]: PN every 10th visit.     PT Start Time  1447    PT Stop Time  1530    PT Time Calculation (min)  43 min    Equipment Utilized During Treatment  --   min guard to S prn   Activity Tolerance  Patient tolerated treatment well    Behavior During Therapy  WFL for tasks assessed/performed       Past Medical History:  Diagnosis Date  . Back pain   . Chronic fatigue syndrome    Dr. Sabra Heck  . Cough    /SOB, PFT's nl, improved with Bronchodilation 8/11  . Esophageal spasm    GERD Dr. Sabra Heck, Dr. Wynetta Emery; egd 2006 nl; UGI 3/13 Small HH, moderate GERD, nl motility; seen at [facility] ([place] egd? neg, sone improvement on sucralfate susp  . Fibromyalgia   . Foot pain    Dr. Rushie Nyhan  . High triglycerides   . Hypothyroidism    Dr. Lewis Shock 4/14  . Leukocytoclastic vasculitis ([place])    Dr. Tonia Brooms  . Low HDL (under 40)   . Mitral valve prolapse   . Paroxysmal atrial tachycardia (HCC)    Dr. Marlou Porch  . Pernicious anemia    Dr. Sabra Heck  . Reflux    ? delayed gastric emptying--GES nl 01/2012 (6% at 2hrs)  . RLS (restless legs syndrome)    low ferritin Dr. Sabra Heck  . Uterine carcinoma (HCC)    Dr. Polly Cobia    Past Surgical History:  Procedure Laterality Date  . ABDOMINAL HYSTERECTOMY    . BUNIONECTOMY    . CHOLECYSTECTOMY    . COLONOSCOPY     scr, 12/2008 ([facility]) nl  . COLOSTOMY Left 06/05/2018   Procedure: COLOSTOMY;  Surgeon: Ralene Ok, MD;  Location: [place];  Service: General;  Laterality:  Left;  . HAMMER TOE SURGERY    . LUNG BIOPSY     L Lower lung pulm nodules, largest 4.9mm, low risk, repeat CT in 1 yr now following with pulm at [facility]; 9/13 Stable tiny lung nodules, no f/u needed  . METATARSAL OSTEOTOMY    . NECK SURGERY    . PARTIAL COLECTOMY N/A 06/05/2018   Procedure: EXPLORATORY LAPAROTOMY AND PARTIAL COLECTOMY;  Surgeon: Ralene Ok, MD;  Location: [place];  Service: General;  Laterality: N/A;  . US ECHOCARDIOGRAPHY     with Nuclear test, Dr. Marlou Porch, Low risk  . VESICOVAGINAL FISTULA CLOSURE W/ TAH      Vitals:   08/29/18 1450  BP: 122/72    Subjective Assessment - 08/29/18 1450    Subjective  Pt denied falls since last visit. Pt did report some incr. dizziness today, when she got out of the car.     Pertinent History  s/p Sigmoid colectomy, colostomy (temporary per pt) on 06/05/18, Chronic fatigue syndrome, HLD, high triglycerides, hypothryoidism, hx of uterine CA, pernicious anemia, fibromyalgia, asthma, anxiety, chronic LBP, paroxysmal atrial tachycardia, mitral valve prolapse, orthostasis    Patient Stated  Goals  I want to get rid of this vertigo feeling.     Currently in Pain?  Yes    Pain Score  5     Pain Location  Neck    Pain Orientation  Left    Pain Descriptors / Indicators  Aching    Pain Type  Acute pain    Pain Radiating Towards  L shoulder    Pain Onset  In the past 7 days    Pain Frequency  Constant    Aggravating Factors   moving head to the L side    Pain Relieving Factors  Heating pad                            Balance Exercises - 08/29/18 1513      Balance Exercises: Standing   Standing Eyes Opened  Narrow base of support (BOS);Wide (BOA);Head turns;Foam/compliant surface;Solid surface;3 reps;Other reps (comment);30 secs;10 secs   10 reps head turns/nods   Standing Eyes Closed  Narrow base of support (BOS);Wide (BOA);Foam/compliant surface;Solid surface;3 reps;30 secs    Other Standing Exercises  Performed in  corner with chair in front of pt for safety: please see pt instruction for HEP details. Cues and demo for technique. Pt required two seated rest breaks 2/2 lightheadedness and fatigue.         PT Education - 08/29/18 1637    Education Details  PT provided pt with corner balance HEP.     Person(s) Educated  Patient    Methods  Explanation;Demonstration;Verbal cues;Handout    Comprehension  Verbalized understanding;Returned demonstration       PT Short Term Goals - 08/27/18 1016      PT SHORT TERM GOAL #1   Title  Pt will be IND in HEP to improve dizziness, balance, and strength. TARGET DATE FOR ALL STGS: 09/05/18    Status  Achieved      PT SHORT TERM GOAL #2   Title  Pt will amb. 300' over even terrain, while performing head turns/nods, with </=2/10 dizziness, IND.    Status  Achieved      PT SHORT TERM GOAL #3   Title  Perform FGA and write goals as indicated.     Baseline  Baseline FGA is 16/30.--see LTG for goals.    Status  Achieved      PT SHORT TERM GOAL #4   Title  Perform positional testing and write goals as indicated.     Baseline  No evidence of positional vertigo on 08/13/18 *see objective portion of note.    Status  Achieved        PT Long Term Goals - 08/20/18 0951      PT LONG TERM GOAL #1   Title  Pt will amb. 1000' over even/uneven terrain, while performing dynamic gait activities, IND to improve functional mobility. TARGET DATE FOR LTGS: 10/03/18    Status  New      PT LONG TERM GOAL #2   Title  Pt will report dizziness has decr. to </=3/10 at worst to improve QOL and safety during functional mobility.     Status  New      PT LONG TERM GOAL #3   Title  The patient will improve FGA From 16/30 up to 21/30 to demo improving safety during dynamic gait activities.    Status  New    Target Date  10/03/18  Plan - 08/29/18 1638    Clinical Impression Statement  Today's skilled session focused on corner balance activities to improve  vestibular input and overall balance. Pt required rest breaks 2/2 fatigue and lightheadedness. PT assessed pt's BP and it was WNL. Pt experienced incr. postural sway during activities on compliant surfaces, which required incr. vestibular input. PT did not add turns to HEP, as pt denied dizziness during 180 and 360 degree turns. Continue with POC.     PT Treatment/Interventions  ADLs/Self Care Home Management;Biofeedback;Canalith Repostioning;Therapeutic exercise;Therapeutic activities;Functional mobility training;Orthotic Fit/Training;Manual techniques;Vestibular;Stair training;Gait training;Patient/family education;DME Instruction;Neuromuscular re-education;Balance training    PT Next Visit Plan  High level balance and monitor vitals. Begin to check STGs.     Consulted and Agree with Plan of Care  Patient       Patient will benefit from skilled therapeutic intervention in order to improve the following deficits and impairments:  Abnormal gait, Decreased endurance, Decreased knowledge of use of DME, Decreased balance, Decreased mobility, Decreased range of motion, Impaired flexibility, Postural dysfunction, Dizziness, Decreased strength, Pain  Visit Diagnosis: Dizziness and giddiness  Other abnormalities of gait and mobility     Problem List Patient Active Problem List   Diagnosis Date Noted  . 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 Bowel perforation ([place]) 06/07/2018  . Anemia 06/07/2018  . Hypokalemia 06/07/2018  . Hypophosphatemia 06/07/2018  . AKI (acute kidney injury) ([place])   . Leukocytosis (leucocytosis) 06/04/2018  . Fecal impaction ([place]) 06/03/2018  . Enterocolitis 06/03/2018  . Asthma 06/03/2018  . Chronic pain syndrome 06/03/2018  . Chronic fatigue syndrome 11/28/2017  . Pernicious anemia 11/28/2017  . 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 Orthostatic hypotension 03/22/2015  . Paroxysmal atrial tachycardia ([place])   . Hypothyroidism 08/09/2012    Bronnie Vasseur L 08/29/2018,  4:40 PM  [facility] [address] [place], [place], [address] Phone: [phone]   Fax:  [phone]  Name: RIVKY CLENDENNING MRN: [id number] Date of Birth: Mar 01, 1956  Geoffry Paradise, PT,DPT 08/29/18 4:43 PM Phone: [phone] Fax: [phone]

## 2018-09-03 ENCOUNTER — Ambulatory Visit: Payer: Medicare Other

## 2018-09-05 ENCOUNTER — Ambulatory Visit: Payer: Self-pay | Admitting: General Surgery

## 2018-09-05 NOTE — H&P (Signed)
History of Present Illness Ralene Ok MD; 09/05/2018 12:08 PM) The patient is a 62 year old female presenting for a post-operative visit. 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 Patient is been doing well with her midline wound. She continues with wet-to-dry dressing changes. Patient has been doing with her colostomy.  Patient is ready to discuss reversal of her colostomy. Patient previously has seen Dr. Si Gaul for colonoscopies. --------------------------------   Patient has been doing well since her last clinic visit. Patient had some small granulation tissue just to the left upper ostomy site. Patient continues to change her midline wound twice a day. This appears to be healing well.  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 Patient has been doing well since her last clinic visit. She states her antibiotics. She states she is afebrile at home. She has had some slow GI transit time. She did take a stool softeners which causes nausea, and abdominal cramping. Patient has continue with twice a day dressing changes. Ostomy appears to be working well.   Allergies (Tanisha A. Owens Shark, [place]; 09/05/2018 11:44 AM) Cipro *FLUOROQUINOLONES*  Minocycline HCl *TETRACYCLINES*  Ambien *HYPNOTICS/SEDATIVES/SLEEP DISORDER AGENTS*  Latex Gloves *MEDICAL DEVICES AND SUPPLIES*  Allergies Reconciled   Medication History (Tanisha A. Owens Shark, [place]; 09/05/2018 11:44 AM) ALPRAZolam (1MG  Tablet, Oral) Active. Lyrica (75MG  Capsule, Oral) Active. Dexilant (60MG  Capsule DR, Oral) Active. Meloxicam (15MG  Tablet, Oral) Active. Metoprolol Succinate ER (25MG  Tablet ER 24HR, Oral) Active. Venlafaxine HCl ER (37.5MG  Capsule ER 24HR, Oral) Active. Levothyroxine Sodium (137MCG  Tablet, Oral) Active. BuPROPion HCl ER (XL) (150MG  Tablet ER 24HR, Oral) Active. Medications Reconciled    Review of Systems Ralene Ok, MD; 09/05/2018 12:9 PM) General Present- Fatigue. Not Present- Appetite Loss, Chills, Fever, Night Sweats, Weight Gain and Weight Loss. Skin Not Present- Change in Wart/Mole, Dryness, Hives, Jaundice, New Lesions, Non-Healing Wounds, Rash and Ulcer. HEENT Present- Seasonal Allergies and Wears glasses/contact lenses. Not Present- Earache, Hearing Loss, Hoarseness, Nose Bleed, Oral Ulcers, Ringing in the Ears, Sinus Pain, Sore Throat, Visual Disturbances and Yellow Eyes. Respiratory Present- Snoring. Not Present- Bloody sputum, Chronic Cough, Difficulty Breathing and Wheezing. Gastrointestinal Present- Abdominal Pain. Not Present- Bloating, Bloody Stool, Change in Bowel Habits, Chronic diarrhea, Constipation, Difficulty Swallowing, Excessive gas, Gets full quickly at meals, Hemorrhoids, Indigestion, Nausea, Rectal Pain and Vomiting. Female Genitourinary Not Present- Frequency, Nocturia, Painful Urination, Pelvic Pain and Urgency. Musculoskeletal Present- Joint Pain and Muscle Pain. Not Present- Back Pain, Joint Stiffness, Muscle Weakness and Swelling of Extremities. Neurological Present- Weakness. Not Present- Decreased Memory, Fainting, Headaches, Numbness, Seizures, Tingling, Tremor and Trouble walking. Psychiatric Not Present- Anxiety, Bipolar, Change in Sleep Pattern, Depression, Fearful and Frequent crying. Endocrine Present- Hot flashes. Not Present- Cold Intolerance, Excessive Hunger, Hair Changes, Heat Intolerance and New Diabetes. Hematology Not Present- Blood Thinners, Easy Bruising, Excessive bleeding, Gland problems, HIV and Persistent Infections.  Vitals (Tanisha A. Brown RMA; 09/05/2018 11:44 AM) 09/05/2018 11:43 AM Weight: 155 lb Height: 65in Body Surface Area: 1.78 m Body Mass Index: 25.79 kg/m  Temp.: 98.55F  Pulse: 93  (Regular)  BP: 132/82 (Sitting, Left Arm, Standard)       Physical Exam Ralene Ok MD; 09/05/2018 12:08 PM) The physical exam findings are as follows: Note:Constitutional: No acute distress, conversant, appears stated age  Eyes: Anicteric sclerae, moist conjunctiva, no lid lag  Neck: No thyromegaly, trachea midline, no  cervical lymphadenopathy  Lungs: Clear to auscultation biilaterally, normal respiratory effot  Cardiovascular: regular rate & rhythm, no murmurs, no peripheal edema, pedal pulses 2+  GI: Soft, no masses or hepatosplenomegaly, non-tender to palpation  MSK: Normal gait, no clubbing cyanosis, edema  Skin: No rashes, palpation reveals normal skin turgor  Psychiatric: Appropriate judgment and insight, oriented to person, place, and time  General Note: Alert, oriented, in no acute distress   Chest and Lung Exam Note: Effort normal   Abdomen Note: Soft, non-tender, non-distended Stoma on left side, functioning Midline wound with clean base,     Assessment & Plan Ralene Ok MD; 09/05/2018 12:09 PM) S/P EXPLORATORY LAPAROTOMY (E09.233) Impression: 62 year old female 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 1. We'll have the patient undergo a contrast enema to evaluate the anatomy 2. We'll have the patient referred back to Dr. Carma Leaven for screening colonoscopy 3. Once we have received his results we'll proceed with diagnostic laparoscopy, lysis of adhesions, possible exploratory laparoscopy for Hartmann's reversal 4. Discussed with the patient the risks and benefits of the procedure to include but not limited to: Infection, bleeding, damage to structures, possible need for further surgery and/or ileostomy. The patient was understanding and wishes to proceed.

## 2018-09-06 ENCOUNTER — Encounter: Payer: Medicare Other | Admitting: Rehabilitative and Restorative Service Providers"

## 2018-09-09 ENCOUNTER — Other Ambulatory Visit: Payer: Self-pay | Admitting: General Surgery

## 2018-09-09 DIAGNOSIS — Z9889 Other specified postprocedural states: Secondary | ICD-10-CM

## 2018-09-10 ENCOUNTER — Ambulatory Visit: Payer: Medicare Other | Attending: Family Medicine | Admitting: Rehabilitative and Restorative Service Providers"

## 2018-09-10 ENCOUNTER — Encounter: Payer: Self-pay | Admitting: Rehabilitative and Restorative Service Providers"

## 2018-09-10 DIAGNOSIS — R42 Dizziness and giddiness: Secondary | ICD-10-CM | POA: Insufficient documentation

## 2018-09-10 DIAGNOSIS — R2689 Other abnormalities of gait and mobility: Secondary | ICD-10-CM | POA: Diagnosis not present

## 2018-09-10 NOTE — Therapy (Addendum)
[facility] [address] [place], [place], [address] Phone: [phone]   Fax:  [phone]  Physical Therapy Treatment and Discharge Summary  Patient Details  Name: [name] MRN: [id number] Date of Birth: Jul 16, 1956 Referring Provider (PT): Dr. Kathyrn Lass   Encounter Date: 09/10/2018  PT End of Session - 09/10/18 0936    Visit Number  6    Number of Visits  17    Date for PT Re-Evaluation  10/07/18    Authorization Type  Medicare and Aetna: PN every 10th visit.     PT Start Time  0932    PT Stop Time  1015    PT Time Calculation (min)  43 min    Equipment Utilized During Treatment  --   min guard to S prn   Activity Tolerance  Patient tolerated treatment well    Behavior During Therapy  WFL for tasks assessed/performed       Past Medical History:  Diagnosis Date  . Back pain   . Chronic fatigue syndrome    Dr. Sabra Heck  . Cough    /SOB, PFT's nl, improved with Bronchodilation 8/11  . Esophageal spasm    GERD Dr. Sabra Heck, Dr. Wynetta Emery; egd 2006 nl; UGI 3/13 Small HH, moderate GERD, nl motility; seen at [facility] ([place]), egd? neg, sone improvement on sucralfate susp  . Fibromyalgia   . Foot pain    Dr. Rushie Nyhan  . High triglycerides   . Hypothyroidism    Dr. [place] Shock 4/14  . Leukocytoclastic vasculitis ([place])    Dr. Tonia Brooms  . Low HDL (under 40)   . Mitral valve prolapse   . Paroxysmal atrial tachycardia (HCC)    Dr. Marlou Porch  . Pernicious anemia    Dr. Sabra Heck  . Reflux    ? delayed gastric emptying--GES nl 01/2012 (6% at 2hrs)  . RLS (restless legs syndrome)    low ferritin Dr. Sabra Heck  . Uterine carcinoma (HCC)    Dr. Polly Cobia    Past Surgical History:  Procedure Laterality Date  . ABDOMINAL HYSTERECTOMY    . BUNIONECTOMY    . CHOLECYSTECTOMY    . COLONOSCOPY     scr, 12/2008 ([facility]) nl  . COLOSTOMY Left 06/05/2018   Procedure: COLOSTOMY;  Surgeon: Ralene Ok, MD;  Location: [facility];  Service:  General;  Laterality: Left;  . HAMMER TOE SURGERY    . LUNG BIOPSY     L Lower lung pulm nodules, largest 4.35m, low risk, repeat CT in 1 yr now [facility] Stable tiny lung nodules, no f/u needed  . METATARSAL OSTEOTOMY    . NECK SURGERY    . PARTIAL COLECTOMY N/A 06/05/2018   Procedure: EXPLORATORY LAPAROTOMY AND PARTIAL COLECTOMY;  Surgeon: RRalene Ok MD;  Location: [facility]  Service: General;  Laterality: N/A;  . UKoreaECHOCARDIOGRAPHY     with Nuclear test, Dr. SMarlou Porch Low risk  . VESICOVAGINAL FISTULA CLOSURE W/ TAH      There were no vitals filed for this visit.  Subjective Assessment - 09/10/18 0932    Subjective  The patient reports that dizziness is doing better.  She may have symptoms 1-2 times/day versus all day.   Patient notes mornings are her best times and she notes fatigue begins mid afternoon.  She notes some fluctuations in her functional mobility.  She had a good day 2 days ago and a bad day yesterday.  She continues with out of breath sensations  throughout the day.      Pertinent History  s/p Sigmoid colectomy, colostomy (temporary per pt) on 06/05/18, Chronic fatigue syndrome, HLD, high triglycerides, hypothryoidism, hx of uterine CA, pernicious anemia, fibromyalgia, asthma, anxiety, chronic LBP, paroxysmal atrial tachycardia, mitral valve prolapse, orthostasis    Patient Stated Goals  I want to get rid of this vertigo feeling.     Currently in Pain?  No/denies         [facility] PT Assessment - 09/10/18 0944      Functional Gait  Assessment   Gait assessed   Yes    Gait Level Surface  Walks 20 ft in less than 7 sec but greater than 5.5 sec, uses assistive device, slower speed, mild gait deviations, or deviates 6-10 in outside of the 12 in walkway width.    Change in Gait Speed  Able to smoothly change walking speed without loss of balance or gait deviation. Deviate no more than 6 in outside of the 12 in walkway width.    Gait with Horizontal Head  Turns  Performs head turns with moderate changes in gait velocity, slows down, deviates 10-15 in outside 12 in walkway width but recovers, can continue to walk.    Gait with Vertical Head Turns  Performs head turns with no change in gait. Deviates no more than 6 in outside 12 in walkway width.    Gait and Pivot Turn  Pivot turns safely within 3 sec and stops quickly with no loss of balance.    Step Over Obstacle  Is able to step over one shoe box (4.5 in total height) without changing gait speed. No evidence of imbalance.    Gait with Narrow Base of Support  Ambulates 4-7 steps.    Gait with Eyes Closed  Walks 20 ft, uses assistive device, slower speed, mild gait deviations, deviates 6-10 in outside 12 in walkway width. Ambulates 20 ft in less than 9 sec but greater than 7 sec.    Ambulating Backwards  Walks 20 ft, uses assistive device, slower speed, mild gait deviations, deviates 6-10 in outside 12 in walkway width.    Steps  Alternating feet, must use rail.    Total Score  21    FGA comment:  21/30                   [facility] Adult PT Treatment/Exercise - 09/10/18 1015      Self-Care   Self-Care  Other Self-Care Comments    Other Self-Care Comments   Patient continuing to note biggest functional limitations are a lightheadedness that "comes over me" after transitioning to stand/walking from one room into the other.  She also notes variability of condition in which one day she feels good, the next day she is fatigued, which may have multiple factors (chronic fatigue, fibromyalgia, shortness of breath she attributes to asthma).  ** Reviewed and printed/consolidated all prior HEP and recommended the patient perform as able.                PT Short Term Goals - 08/27/18 1016      PT SHORT TERM GOAL #1   Title  Pt will be IND in HEP to improve dizziness, balance, and strength. TARGET DATE FOR ALL STGS: 09/05/18    Status  Achieved      PT SHORT TERM GOAL #2   Title  Pt will  amb. 300' over even terrain, while performing head turns/nods, with </=2/10 dizziness, IND.  Status  Achieved      PT SHORT TERM GOAL #3   Title  Perform FGA and write goals as indicated.     Baseline  Baseline FGA is 16/30.--see LTG for goals.    Status  Achieved      PT SHORT TERM GOAL #4   Title  Perform positional testing and write goals as indicated.     Baseline  No evidence of positional vertigo on 08/13/18 *see objective portion of note.    Status  Achieved        PT Long Term Goals - 09/10/18 0943      PT LONG TERM GOAL #1   Title  Pt will amb. 1000' over even/uneven terrain, while performing dynamic gait activities, IND to improve functional mobility. TARGET DATE FOR LTGS: 10/03/18    Baseline  Patient notes she can walk 15-20 minutes nonstop in her yard/ around her house.  She notes no imbalance with unlevel surface negotiation.    Status  Achieved      PT LONG TERM GOAL #2   Title  Pt will report dizziness has decr. to </=3/10 at worst to improve QOL and safety during functional mobility.     Baseline  0/10 baseline;  at its worst is a 7 or 8/10.  Example: getting up, walking to kitchen, then it "comes over me".      Status  Not Met      PT LONG TERM GOAL #3   Title  The patient will improve FGA From 16/30 up to 21/30 to demo improving safety during dynamic gait activities.    Baseline  21/30 on 09/13/2018    Status  Achieved            Plan - 09/10/18 1304    Clinical Impression Statement  The patient has met 2 LTGs.  On days she has dizziness, she notees it can still increase to 7-8/10, however the quality of her symptoms is described as a lightheadedness that "comes over me" after transitioning sit>stand and walking.  PT reviewed HEP and recommended patient f/u with MD regarding further assessment due to intermittent symptoms of lightheadedness with position changes.  On 08/27/18, her BP did drop >76 mm Hg on systolic BP with orthostatic testing.  The patient  notes when this occurred in the past, she saw her MD and made some adjustments to medications.  PT reocmmended holding further visits as patient is indep with HEP.      PT Treatment/Interventions  ADLs/Self Care Home Management;Biofeedback;Canalith Repostioning;Therapeutic exercise;Therapeutic activities;Functional mobility training;Orthotic Fit/Training;Manual techniques;Vestibular;Stair training;Gait training;Patient/family education;DME Instruction;Neuromuscular re-education;Balance training    PT Next Visit Plan  Hold for patient to f/u with MD-  can schedule if any further treatment indicated (PT to d/c in 1 month if no further contact).    Consulted and Agree with Plan of Care  Patient       Patient will benefit from skilled therapeutic intervention in order to improve the following deficits and impairments:  Abnormal gait, Decreased endurance, Decreased knowledge of use of DME, Decreased balance, Decreased mobility, Decreased range of motion, Impaired flexibility, Postural dysfunction, Dizziness, Decreased strength, Pain  Visit Diagnosis: Dizziness and giddiness  Other abnormalities of gait and mobility  PHYSICAL THERAPY DISCHARGE SUMMARY  Visits from Start of Care: 6  Current functional level related to goals / functional outcomes: See goals above  *patient was scheduled to f/u with MD and has not returned.   Plan: Patient agrees to discharge.  Patient  goals were not met. Patient is being discharged due to the physician's request.  ?????         Thank you for the referral of this patient. Rudell Cobb, MPT    Problem List Patient Active Problem List   Diagnosis Date Noted  . 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 Bowel perforation ([place]) 06/07/2018  . Anemia 06/07/2018  . Hypokalemia 06/07/2018  . Hypophosphatemia 06/07/2018  . AKI (acute kidney injury) ([place])   . Leukocytosis (leucocytosis) 06/04/2018  . Fecal impaction ([place]) 06/03/2018  . Enterocolitis  06/03/2018  . Asthma 06/03/2018  . Chronic pain syndrome 06/03/2018  . Chronic fatigue syndrome 11/28/2017  . Pernicious anemia 11/28/2017  . 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 Orthostatic hypotension 03/22/2015  . Paroxysmal atrial tachycardia ([place])   . Hypothyroidism 08/09/2012    Mellany Dinsmore, PT 09/10/2018, 1:07 PM  [facility] [address] [place], [place], [address] Phone: [phone]   Fax:  [phone]  Name: [name] MRN: [id number] Date of Birth: 17-Apr-1956

## 2018-09-10 NOTE — Patient Instructions (Signed)
DAILY:  Gaze Stabilization: Sitting    Keeping eyes on target on wall59feet away, and move head side to side for _60___ seconds. *only side to side.PROGRESS BY INCREASING SPEED AS TOLERATED.  Do __2-3__ sessions per day.  Copyright  VHI. All rights reserved.  Gaze Stabilization: Tip Card  1.Target must remain in focus, not blurry, and appear stationary while head is in motion. 2.Perform exercises with small head movements (45 to either side of midline). 3.Increase speed of head motion so long as target is in focus. 4.If you wear eyeglasses, be sure you can see target through lens (therapist will give specific instructions for bifocal / progressive lenses). 5.These exercises may provoke dizziness or nausea. Work through these symptoms. If too dizzy, slow head movement slightly. Rest between each exercise. 6.Exercises demand concentration; avoid distractions.  Copyright  VHI. All rights reserved.  Monday, Wednesday, Friday:  Heel Raises    Stand with support. Tighten pelvic floor and hold. With knees straight, raise heels off ground. Hold _1-2__ seconds. Repeat 20 times. Do __1-2_ times a day. PROGRESS TO HOVERING HANDS AT COUNTER AS TOLERATED.  Copyright  VHI. All rights reserved.  ANKLE: Dorsiflexion - Standing    Stand with upright posture. Raise toes of both feet up at same time. HOLD COUNTER WITH ONE HAND, PROGRESS TO HOVERING HANDS AT COUNTER AS TOLERATED. _20__ reps per set. 1-2 times/day. Hold onto a support.  Copyright  VHI. All rights reserved.  Mini-Squats (Standing)    Stand with support. Bend knees slightly. Tighten pelvic floor. Hold for __3_ seconds. Return to straight standing, BY SQUEEZING BUTTOCK MUSCLES TOGETHER. MAKE SURE KNEES DO NOT GO OVER TOES.   Repeat _10_ times. Do _1-2__ times a day.  Copyright  VHI. All rights reserved.  Single Leg - Eyes Open    Holding support, lift right leg while maintaining balance over other  leg. Progress to removing hands from support surface for longer periods of time. Hold__10-30__ seconds. Repeat __3__ times per session per leg. Do __1__ sessions per day.  Copyright  VHI. All rights reserved.   _____________________________________________________________________________________________________________________________ Tuesday, Thursday, Saturday:   Feet Heel-Toe "Tandem", Varied Arm Positions - Eyes Open    With eyes open, right foot directly in front of the other, arms at your side, look straight ahead at a stationary object. Hold __30__ seconds. Repeat __3__ times per session per leg. Do __1__ sessions per day.  Copyright  VHI. All rights reserved.  Feet Together (Compliant Surface) Varied Arm Positions - Eyes Closed    Stand on compliant surface: __pillows/cushions______ with feet together and arms at your side. Close eyes and visualize upright position. Hold__30__ seconds. Repeat __3__ times per session. Do __1__ sessions per day.  Copyright  VHI. All rights reserved.  Feet Together (Compliant Surface) Head Motion - Eyes Closed    Stand on compliant surface: __pillows/cushions______ with feet together. Close eyes and move head slowly, up and down 5 times and side to side 5 times. Repeat __3__ times per session. Do __1__ sessions per day.  Copyright  VHI. All rights reserved.

## 2018-09-16 ENCOUNTER — Other Ambulatory Visit: Payer: Medicare Other

## 2018-09-17 ENCOUNTER — Ambulatory Visit
Admission: RE | Admit: 2018-09-17 | Discharge: 2018-09-17 | Disposition: A | Discharge status: Home / Self Care | Payer: Medicare Other | Source: Ambulatory Visit | Attending: General Surgery | Admitting: General Surgery

## 2018-09-17 DIAGNOSIS — Z9889 Other specified postprocedural states: Secondary | ICD-10-CM

## 2018-09-17 DIAGNOSIS — K5730 Diverticulosis of large intestine without perforation or abscess without bleeding: Secondary | ICD-10-CM | POA: Diagnosis not present

## 2018-09-30 DIAGNOSIS — Z9049 Acquired absence of other specified parts of digestive tract: Secondary | ICD-10-CM | POA: Diagnosis not present

## 2018-09-30 DIAGNOSIS — K562 Volvulus: Secondary | ICD-10-CM | POA: Diagnosis not present

## 2018-10-01 DIAGNOSIS — R42 Dizziness and giddiness: Secondary | ICD-10-CM | POA: Diagnosis not present

## 2018-10-01 DIAGNOSIS — R0681 Apnea, not elsewhere classified: Secondary | ICD-10-CM | POA: Diagnosis not present

## 2018-10-01 DIAGNOSIS — G2581 Restless legs syndrome: Secondary | ICD-10-CM | POA: Diagnosis not present

## 2018-10-01 DIAGNOSIS — Z6825 Body mass index (BMI) 25.0-25.9, adult: Secondary | ICD-10-CM | POA: Diagnosis not present

## 2018-10-04 DIAGNOSIS — Z1211 Encounter for screening for malignant neoplasm of colon: Secondary | ICD-10-CM | POA: Diagnosis not present

## 2018-10-04 DIAGNOSIS — K5140 Inflammatory polyps of colon without complications: Secondary | ICD-10-CM | POA: Diagnosis not present

## 2018-10-08 DIAGNOSIS — K5140 Inflammatory polyps of colon without complications: Secondary | ICD-10-CM | POA: Diagnosis not present

## 2018-10-08 DIAGNOSIS — Z1211 Encounter for screening for malignant neoplasm of colon: Secondary | ICD-10-CM | POA: Diagnosis not present

## 2018-10-11 NOTE — Progress Notes (Signed)
09/17/2018- noted in Epic- DG colon w/cm thru colostomy

## 2018-10-11 NOTE — Patient Instructions (Addendum)
[name]  10/11/2018   Your procedure is scheduled on: Tuesday 10/15/2018  Report to [facility] Main  Entrance              Report to admitting at 1130 AM    Call this number if you have problems the morning of surgery [phone]               The day before surgery , drink clear liquids all day up until 1030 am!              Use one Fleet's enema the night before surgery!   DRINK 2 PRESURGERY ENSURE DRINKS THE NIGHT BEFORE SURGERY AT  1000 PM AND 1 PRESURGERY DRINK THE DAY OF THE PROCEDURE 3 HOURS PRIOR TO SCHEDULED SURGERY. NO SOLIDS AFTER MIDNIGHT THE DAY PRIOR TO THE SURGERY. NOTHING BY MOUTH EXCEPT CLEAR LIQUIDS UNTIL THREE HOURS PRIOR TO SCHEDULED SURGERY. PLEASE FINISH PRESURGERY ENSURE DRINK PER SURGEON ORDER 3 HOURS PRIOR TO SCHEDULED SURGERY TIME WHICH NEEDS TO BE COMPLETED AT 1030 am.    CLEAR LIQUID DIET   Foods Allowed                                                                     Foods Excluded  Coffee and tea, regular and decaf                             liquids that you cannot  Plain Jell-O in any flavor                                             see through such as: Fruit ices (not with fruit pulp)                                     milk, soups, orange juice  Iced Popsicles                                    All solid food Carbonated beverages, regular and diet                                    Cranberry, grape and apple juices Sports drinks like Gatorade Lightly seasoned clear broth or consume(fat free) Sugar, honey syrup  Sample Menu Breakfast                                Lunch                                     Supper Cranberry juice  Beef broth                            Chicken broth Jell-O                                     Grape juice                           Apple juice Coffee or tea                        Jell-O                                      Popsicle                  Coffee or tea                        Coffee or tea  _____________________________________________________________________                BRUSH YOUR TEETH MORNING OF SURGERY AND RINSE YOUR MOUTH OUT, NO CHEWING GUM CANDY OR MINTS.     Take these medicines the morning of surgery with A SIP OF WATER: Bupropion (Wellbutrin), Levothyroxine (Synthroid), Metoprolol succinate (Toprol-XL), Pregabalin (Lyrica), use Albuterol inhaler as needed and Advair inhaler as needed and bring inhalers with you to the hospital                                 You may not have any metal on your body including hair pins and              piercings  Do not wear jewelry, make-up, lotions, powders or perfumes, deodorant             Do not wear nail polish.  Do not shave  48 hours prior to surgery.               Do not bring valuables to the hospital. [place].  Contacts, dentures or bridgework may not be worn into surgery.  Leave suitcase in the car. After surgery it may be brought to your room.                  Please read over the following fact sheets you were given: _____________________________________________________________________             [facility] - Preparing for Surgery Before surgery, you can play an important role.  Because skin is not sterile, your skin needs to be as free of germs as possible.  You can reduce the number of germs on your skin by washing with CHG (chlorahexidine gluconate) soap before surgery.  CHG is an antiseptic cleaner which kills germs and bonds with the skin to continue killing germs even after washing. Please DO NOT use if you have an allergy to CHG or antibacterial soaps.  If your skin becomes reddened/irritated stop using the CHG and inform your nurse when you arrive at Short Stay. Do not shave (  including legs and underarms) for at least 48 hours prior to the first CHG shower.  You may shave your  face/neck. Please follow these instructions carefully:  1.  Shower with CHG Soap the night before surgery and the  morning of Surgery.  2.  If you choose to wash your hair, wash your hair first as usual with your  normal  shampoo.  3.  After you shampoo, rinse your hair and body thoroughly to remove the  shampoo.                           4.  Use CHG as you would any other liquid soap.  You can apply chg directly  to the skin and wash                       Gently with a scrungie or clean washcloth.  5.  Apply the CHG Soap to your body ONLY FROM THE NECK DOWN.   Do not use on face/ open                           Wound or open sores. Avoid contact with eyes, ears mouth and genitals (private parts).                       Wash face,  Genitals (private parts) with your normal soap.             6.  Wash thoroughly, paying special attention to the area where your surgery  will be performed.  7.  Thoroughly rinse your body with warm water from the neck down.  8.  DO NOT shower/wash with your normal soap after using and rinsing off  the CHG Soap.                9.  Pat yourself dry with a clean towel.            10.  Wear clean pajamas.            11.  Place clean sheets on your bed the night of your first shower and do not  sleep with pets. Day of Surgery : Do not apply any lotions/deodorants the morning of surgery.  Please wear clean clothes to the hospital/surgery center.  FAILURE TO FOLLOW THESE INSTRUCTIONS MAY RESULT IN THE CANCELLATION OF YOUR SURGERY PATIENT SIGNATURE_________________________________  NURSE SIGNATURE__________________________________  ________________________________________________________________________   Adam Phenix  An incentive spirometer is a tool that can help keep your lungs clear and active. This tool measures how well you are filling your lungs with each breath. Taking long deep breaths may help reverse or decrease the chance of developing breathing  (pulmonary) problems (especially infection) following:  A long period of time when you are unable to move or be active. BEFORE THE PROCEDURE   If the spirometer includes an indicator to show your best effort, your nurse or respiratory therapist will set it to a desired goal.  If possible, sit up straight or lean slightly forward. Try not to slouch.  Hold the incentive spirometer in an upright position. INSTRUCTIONS FOR USE  1. Sit on the edge of your bed if possible, or sit up as far as you can in bed or on a chair. 2. Hold the incentive spirometer in an upright position. 3. Breathe out normally. 4. Place the mouthpiece in your mouth  and seal your lips tightly around it. 5. Breathe in slowly and as deeply as possible, raising the piston or the ball toward the top of the column. 6. Hold your breath for 3-5 seconds or for as long as possible. Allow the piston or ball to fall to the bottom of the column. 7. Remove the mouthpiece from your mouth and breathe out normally. 8. Rest for a few seconds and repeat Steps 1 through 7 at least 10 times every 1-2 hours when you are awake. Take your time and take a few normal breaths between deep breaths. 9. The spirometer may include an indicator to show your best effort. Use the indicator as a goal to work toward during each repetition. 10. After each set of 10 deep breaths, practice coughing to be sure your lungs are clear. If you have an incision (the cut made at the time of surgery), support your incision when coughing by placing a pillow or rolled up towels firmly against it. Once you are able to get out of bed, walk around indoors and cough well. You may stop using the incentive spirometer when instructed by your caregiver.  RISKS AND COMPLICATIONS  Take your time so you do not get dizzy or light-headed.  If you are in pain, you may need to take or ask for pain medication before doing incentive spirometry. It is harder to take a deep breath if you  are having pain. AFTER USE  Rest and breathe slowly and easily.  It can be helpful to keep track of a log of your progress. Your caregiver can provide you with a simple table to help with this. If you are using the spirometer at home, follow these instructions: [place] IF:   You are having difficultly using the spirometer.  You have trouble using the spirometer as often as instructed.  Your pain medication is not giving enough relief while using the spirometer.  You develop fever of 100.5 F (38.1 C) or higher. SEEK IMMEDIATE MEDICAL CARE IF:   You cough up bloody sputum that had not been present before.  You develop fever of 102 F (38.9 C) or greater.  You develop worsening pain at or near the incision site. MAKE SURE YOU:   Understand these instructions.  Will watch your condition.  Will get help right away if you are not doing well or get worse. Document Released: 02/26/2007 Document Revised: 01/08/2012 Document Reviewed: 04/29/2007 [facility] Patient Information 2014 [place], [place].   ________________________________________________________________________

## 2018-10-14 ENCOUNTER — Other Ambulatory Visit: Payer: Self-pay

## 2018-10-14 ENCOUNTER — Encounter (HOSPITAL_COMMUNITY)
Admission: RE | Admit: 2018-10-14 | Discharge: 2018-10-14 | Disposition: A | Discharge status: Home / Self Care | Payer: Medicare Other | Source: Ambulatory Visit | Attending: General Surgery | Admitting: General Surgery

## 2018-10-14 ENCOUNTER — Encounter (HOSPITAL_COMMUNITY): Payer: Self-pay

## 2018-10-14 DIAGNOSIS — Z91010 Allergy to peanuts: Secondary | ICD-10-CM | POA: Diagnosis not present

## 2018-10-14 DIAGNOSIS — I10 Essential (primary) hypertension: Secondary | ICD-10-CM | POA: Insufficient documentation

## 2018-10-14 DIAGNOSIS — Z881 Allergy status to other antibiotic agents status: Secondary | ICD-10-CM | POA: Diagnosis not present

## 2018-10-14 DIAGNOSIS — Z91040 Latex allergy status: Secondary | ICD-10-CM | POA: Diagnosis not present

## 2018-10-14 DIAGNOSIS — Z91030 Bee allergy status: Secondary | ICD-10-CM | POA: Diagnosis not present

## 2018-10-14 DIAGNOSIS — K660 Peritoneal adhesions (postprocedural) (postinfection): Secondary | ICD-10-CM | POA: Diagnosis not present

## 2018-10-14 DIAGNOSIS — Z91013 Allergy to seafood: Secondary | ICD-10-CM | POA: Diagnosis not present

## 2018-10-14 DIAGNOSIS — Z91048 Other nonmedicinal substance allergy status: Secondary | ICD-10-CM | POA: Diagnosis not present

## 2018-10-14 DIAGNOSIS — Z01818 Encounter for other preprocedural examination: Secondary | ICD-10-CM

## 2018-10-14 DIAGNOSIS — R5082 Postprocedural fever: Secondary | ICD-10-CM | POA: Diagnosis not present

## 2018-10-14 DIAGNOSIS — Z433 Encounter for attention to colostomy: Secondary | ICD-10-CM | POA: Diagnosis not present

## 2018-10-14 DIAGNOSIS — Z888 Allergy status to other drugs, medicaments and biological substances status: Secondary | ICD-10-CM | POA: Diagnosis not present

## 2018-10-14 HISTORY — DX: Other specified postprocedural states: R11.2

## 2018-10-14 HISTORY — DX: Nausea with vomiting, unspecified: Z98.890

## 2018-10-14 HISTORY — DX: Cardiac arrhythmia, unspecified: I49.9

## 2018-10-14 HISTORY — DX: Other complications of anesthesia, initial encounter: T88.59XA

## 2018-10-14 HISTORY — DX: Adverse effect of unspecified anesthetic, initial encounter: T41.45XA

## 2018-10-14 LAB — BASIC METABOLIC PANEL
Anion gap: 12 (ref 5–15)
BUN: 11 mg/dL (ref 8–23)
CHLORIDE: 102 mmol/L (ref 98–111)
CO2: 25 mmol/L (ref 22–32)
Calcium: 9.7 mg/dL (ref 8.9–10.3)
Creatinine, Ser: 0.91 mg/dL (ref 0.44–1.00)
GFR calc Af Amer: >60 mL/min (ref >60)
GFR calc non Af Amer: >60 mL/min (ref >60)
Glucose, Bld: 81 mg/dL (ref 70–99)
Potassium: 4.2 mmol/L (ref 3.5–5.1)
Sodium: 139 mmol/L (ref 135–145)

## 2018-10-14 LAB — CBC
HEMATOCRIT: 45 % (ref 36.0–46.0)
Hemoglobin: 14 g/dL (ref 12.0–15.0)
MCH: 28.4 pg (ref 26.0–34.0)
MCHC: 31.1 g/dL (ref 30.0–36.0)
MCV: 91.3 fL (ref 80.0–100.0)
NRBC: 0 % (ref 0.0–0.2)
Platelets: 479 10*3/uL — ABNORMAL HIGH (ref 150–400)
RBC: 4.93 MIL/uL (ref 3.87–5.11)
RDW: 15.6 % — ABNORMAL HIGH (ref 11.5–15.5)
WBC: 12.9 10*3/uL — AB (ref 4.0–10.5)

## 2018-10-14 LAB — HEMOGLOBIN A1C
Hgb A1c MFr Bld: 5.6 % (ref 4.8–5.6)
Mean Plasma Glucose: 114.02 mg/dL

## 2018-10-15 NOTE — Anesthesia Preprocedure Evaluation (Addendum)
Anesthesia Evaluation  Patient identified by MRN, date of birth, ID band Patient awake    Reviewed: Allergy & Precautions, NPO status , Patient's Chart, lab work & pertinent test results, reviewed documented beta blocker date and time   History of Anesthesia Complications (+) PONV  Airway Mallampati: III  TM Distance: >3 FB Neck ROM: Limited    Dental no notable dental hx.    Pulmonary asthma ,    Pulmonary exam normal breath sounds clear to auscultation       Cardiovascular Normal cardiovascular exam+ dysrhythmias Supra Ventricular Tachycardia  Rhythm:Regular Rate:Normal  Paroxysmal atrial tachycardia  Sees cardiologist Marlou Porch)   Neuro/Psych PSYCHIATRIC DISORDERS Anxiety negative neurological ROS     GI/Hepatic Neg liver ROS, GERD  Medicated and Controlled,Esophageal spasm   Endo/Other  Hypothyroidism   Renal/GU negative Renal ROS     Musculoskeletal Back pain RLS (restless legs syndrome)   Abdominal   Peds  Hematology HLD   Anesthesia Other Findings COLOSTOMY  Reproductive/Obstetrics                            Anesthesia Physical Anesthesia Plan  ASA: III  Anesthesia Plan: General   Post-op Pain Management:    Induction: Intravenous  PONV Risk Score and Plan: 4 or greater and Ondansetron, Dexamethasone, Midazolam, Treatment may vary due to age or medical condition and Scopolamine patch - Pre-op  Airway Management Planned: Video Laryngoscope Planned and Oral ETT  Additional Equipment:   Intra-op Plan:   Post-operative Plan: Extubation in OR  Informed Consent: I have reviewed the patients History and Physical, chart, labs and discussed the procedure including the risks, benefits and alternatives for the proposed anesthesia with the patient or authorized representative who has indicated his/her understanding and acceptance.   Dental advisory given  Plan Discussed with:  CRNA  Anesthesia Plan Comments:         Anesthesia Quick Evaluation

## 2018-10-16 ENCOUNTER — Encounter (HOSPITAL_COMMUNITY): Payer: Self-pay | Admitting: *Deleted

## 2018-10-16 ENCOUNTER — Other Ambulatory Visit: Payer: Self-pay

## 2018-10-16 ENCOUNTER — Inpatient Hospital Stay (HOSPITAL_COMMUNITY)
Admission: RE | Admit: 2018-10-16 | Discharge: 2018-10-19 | DRG: 337 | Disposition: A | Discharge status: Home / Self Care | Payer: Medicare Other | Attending: General Surgery | Admitting: General Surgery

## 2018-10-16 ENCOUNTER — Inpatient Hospital Stay (HOSPITAL_COMMUNITY): Payer: Medicare Other | Admitting: Anesthesiology

## 2018-10-16 ENCOUNTER — Encounter (HOSPITAL_COMMUNITY)
Admission: RE | Disposition: A | Discharge status: Home / Self Care | Payer: Self-pay | Source: Home / Self Care | Attending: General Surgery

## 2018-10-16 DIAGNOSIS — Z91010 Allergy to peanuts: Secondary | ICD-10-CM | POA: Diagnosis not present

## 2018-10-16 DIAGNOSIS — J45909 Unspecified asthma, uncomplicated: Secondary | ICD-10-CM | POA: Diagnosis not present

## 2018-10-16 DIAGNOSIS — R5082 Postprocedural fever: Secondary | ICD-10-CM | POA: Diagnosis not present

## 2018-10-16 DIAGNOSIS — Z881 Allergy status to other antibiotic agents status: Secondary | ICD-10-CM | POA: Diagnosis not present

## 2018-10-16 DIAGNOSIS — Z91048 Other nonmedicinal substance allergy status: Secondary | ICD-10-CM

## 2018-10-16 DIAGNOSIS — Z91040 Latex allergy status: Secondary | ICD-10-CM | POA: Diagnosis not present

## 2018-10-16 DIAGNOSIS — Z433 Encounter for attention to colostomy: Secondary | ICD-10-CM | POA: Diagnosis not present

## 2018-10-16 DIAGNOSIS — Z91013 Allergy to seafood: Secondary | ICD-10-CM | POA: Diagnosis not present

## 2018-10-16 DIAGNOSIS — Z91030 Bee allergy status: Secondary | ICD-10-CM

## 2018-10-16 DIAGNOSIS — Z888 Allergy status to other drugs, medicaments and biological substances status: Secondary | ICD-10-CM | POA: Diagnosis not present

## 2018-10-16 DIAGNOSIS — Z9889 Other specified postprocedural states: Secondary | ICD-10-CM

## 2018-10-16 DIAGNOSIS — K660 Peritoneal adhesions (postprocedural) (postinfection): Secondary | ICD-10-CM | POA: Diagnosis present

## 2018-10-16 DIAGNOSIS — E039 Hypothyroidism, unspecified: Secondary | ICD-10-CM | POA: Diagnosis not present

## 2018-10-16 HISTORY — PX: COLOSTOMY REVERSAL: SHX5782

## 2018-10-16 HISTORY — PX: LAPAROSCOPY: SHX197

## 2018-10-16 SURGERY — LAPAROSCOPY, DIAGNOSTIC
Anesthesia: General | Site: Abdomen

## 2018-10-16 MED ORDER — CELECOXIB 200 MG PO CAPS
200.0000 mg | ORAL_CAPSULE | Freq: Two times a day (BID) | ORAL | Status: DC
  Administered 2018-10-16 – 2018-10-19 (×5): 200 mg via ORAL
  Filled 2018-10-16 (×5): qty 1

## 2018-10-16 MED ORDER — CHLORHEXIDINE GLUCONATE CLOTH 2 % EX PADS: 6.0000 | MEDICATED_PAD | Freq: Once | CUTANEOUS | Status: DC

## 2018-10-16 MED ORDER — ROCURONIUM BROMIDE 10 MG/ML (PF) SYRINGE
PREFILLED_SYRINGE | INTRAVENOUS | Status: DC | PRN
  Administered 2018-10-16: 5 mg via INTRAVENOUS
  Administered 2018-10-16: 10 mg via INTRAVENOUS
  Administered 2018-10-16: 60 mg via INTRAVENOUS

## 2018-10-16 MED ORDER — LACTATED RINGERS IV SOLN
INTRAVENOUS | Status: DC
  Administered 2018-10-16: 07:00:00 via INTRAVENOUS

## 2018-10-16 MED ORDER — HYDROMORPHONE HCL 1 MG/ML IJ SOLN
0.2500 mg | INTRAMUSCULAR | Status: DC | PRN
  Administered 2018-10-16: 0.5 mg via INTRAVENOUS
  Administered 2018-10-16: 0.25 mg via INTRAVENOUS

## 2018-10-16 MED ORDER — FENTANYL CITRATE (PF) 100 MCG/2ML IJ SOLN
INTRAMUSCULAR | Status: DC | PRN
  Administered 2018-10-16: 25 ug via INTRAVENOUS
  Administered 2018-10-16: 50 ug via INTRAVENOUS
  Administered 2018-10-16: 25 ug via INTRAVENOUS

## 2018-10-16 MED ORDER — KETOROLAC TROMETHAMINE 30 MG/ML IJ SOLN
15.0000 mg | INTRAMUSCULAR | Status: AC
  Administered 2018-10-16: 15 mg via INTRAVENOUS

## 2018-10-16 MED ORDER — GABAPENTIN 300 MG PO CAPS: 300.0000 mg | ORAL_CAPSULE | Freq: Two times a day (BID) | ORAL | Status: DC

## 2018-10-16 MED ORDER — LIDOCAINE 2% (20 MG/ML) 5 ML SYRINGE
INTRAMUSCULAR | Status: DC | PRN
  Administered 2018-10-16: 60 mg via INTRAVENOUS

## 2018-10-16 MED ORDER — LIDOCAINE HCL 2 % IJ SOLN
INTRAMUSCULAR | Status: AC
  Filled 2018-10-16: qty 20

## 2018-10-16 MED ORDER — GABAPENTIN 300 MG PO CAPS
300.0000 mg | ORAL_CAPSULE | ORAL | Status: AC
  Administered 2018-10-16: 300 mg via ORAL
  Filled 2018-10-16: qty 1

## 2018-10-16 MED ORDER — ARTIFICIAL TEARS OP OINT
TOPICAL_OINTMENT | OPHTHALMIC | Status: AC
  Filled 2018-10-16: qty 3.5

## 2018-10-16 MED ORDER — PHENYLEPHRINE 40 MCG/ML (10ML) SYRINGE FOR IV PUSH (FOR BLOOD PRESSURE SUPPORT)
PREFILLED_SYRINGE | INTRAVENOUS | Status: DC | PRN
  Administered 2018-10-16 (×3): 80 ug via INTRAVENOUS
  Administered 2018-10-16: 160 ug via INTRAVENOUS

## 2018-10-16 MED ORDER — SODIUM CHLORIDE 0.9 % IV SOLN
2.0000 g | INTRAVENOUS | Status: AC
  Administered 2018-10-16: 2 g via INTRAVENOUS
  Filled 2018-10-16: qty 2

## 2018-10-16 MED ORDER — LEVOTHYROXINE SODIUM 137 MCG PO TABS
137.0000 ug | ORAL_TABLET | Freq: Every day | ORAL | Status: DC
  Administered 2018-10-17 – 2018-10-18 (×2): 137 ug via ORAL
  Filled 2018-10-16 (×3): qty 1

## 2018-10-16 MED ORDER — ONDANSETRON HCL 8 MG PO TABS: 8.0000 mg | ORAL_TABLET | Freq: Three times a day (TID) | ORAL | Status: DC | PRN

## 2018-10-16 MED ORDER — ALBUTEROL SULFATE (2.5 MG/3ML) 0.083% IN NEBU
2.5000 mg | INHALATION_SOLUTION | RESPIRATORY_TRACT | Status: DC | PRN
  Administered 2018-10-17: 2.5 mg via RESPIRATORY_TRACT
  Filled 2018-10-16: qty 3

## 2018-10-16 MED ORDER — ALBUMIN HUMAN 5 % IV SOLN
INTRAVENOUS | Status: AC
  Filled 2018-10-16: qty 250

## 2018-10-16 MED ORDER — PROPOFOL 10 MG/ML IV BOLUS
INTRAVENOUS | Status: DC | PRN
  Administered 2018-10-16: 140 mg via INTRAVENOUS

## 2018-10-16 MED ORDER — DEXAMETHASONE SODIUM PHOSPHATE 10 MG/ML IJ SOLN
INTRAMUSCULAR | Status: AC
  Filled 2018-10-16: qty 1

## 2018-10-16 MED ORDER — SUGAMMADEX SODIUM 200 MG/2ML IV SOLN
INTRAVENOUS | Status: DC | PRN
  Administered 2018-10-16: 140 mg via INTRAVENOUS

## 2018-10-16 MED ORDER — DEXAMETHASONE SODIUM PHOSPHATE 10 MG/ML IJ SOLN
INTRAMUSCULAR | Status: DC | PRN
  Administered 2018-10-16: 8 mg via INTRAVENOUS

## 2018-10-16 MED ORDER — SCOPOLAMINE 1 MG/3DAYS TD PT72
MEDICATED_PATCH | TRANSDERMAL | Status: AC
  Filled 2018-10-16: qty 1

## 2018-10-16 MED ORDER — PHENYLEPHRINE 40 MCG/ML (10ML) SYRINGE FOR IV PUSH (FOR BLOOD PRESSURE SUPPORT)
PREFILLED_SYRINGE | INTRAVENOUS | Status: AC
  Filled 2018-10-16: qty 20

## 2018-10-16 MED ORDER — LIDOCAINE 2% (20 MG/ML) 5 ML SYRINGE
INTRAMUSCULAR | Status: DC | PRN
  Administered 2018-10-16: 1.5 mg/kg/h via INTRAVENOUS

## 2018-10-16 MED ORDER — LACTATED RINGERS IR SOLN
Status: DC | PRN
  Administered 2018-10-16: 1000 mL

## 2018-10-16 MED ORDER — SODIUM CHLORIDE 0.9 % IV SOLN
2.0000 g | Freq: Two times a day (BID) | INTRAVENOUS | Status: AC
  Administered 2018-10-16: 2 g via INTRAVENOUS
  Filled 2018-10-16: qty 2

## 2018-10-16 MED ORDER — MIDAZOLAM HCL 2 MG/2ML IJ SOLN
INTRAMUSCULAR | Status: AC
  Filled 2018-10-16: qty 2

## 2018-10-16 MED ORDER — BUPIVACAINE-EPINEPHRINE (PF) 0.25% -1:200000 IJ SOLN
INTRAMUSCULAR | Status: AC
  Filled 2018-10-16: qty 30

## 2018-10-16 MED ORDER — VITAMIN B-12 1000 MCG PO TABS
1000.0000 ug | ORAL_TABLET | Freq: Every day | ORAL | Status: DC
  Administered 2018-10-17 – 2018-10-19 (×3): 1000 ug via ORAL
  Filled 2018-10-16 (×3): qty 1

## 2018-10-16 MED ORDER — METOPROLOL SUCCINATE ER 25 MG PO TB24
25.0000 mg | ORAL_TABLET | Freq: Every day | ORAL | Status: DC
  Administered 2018-10-18 – 2018-10-19 (×2): 25 mg via ORAL
  Filled 2018-10-16 (×3): qty 1

## 2018-10-16 MED ORDER — 0.9 % SODIUM CHLORIDE (POUR BTL) OPTIME
TOPICAL | Status: DC | PRN
  Administered 2018-10-16: 2000 mL

## 2018-10-16 MED ORDER — ONDANSETRON HCL 4 MG/2ML IJ SOLN: 4.0000 mg | Freq: Four times a day (QID) | INTRAMUSCULAR | Status: DC | PRN

## 2018-10-16 MED ORDER — BUPIVACAINE-EPINEPHRINE 0.25% -1:200000 IJ SOLN
INTRAMUSCULAR | Status: DC | PRN
  Administered 2018-10-16: 30 mL

## 2018-10-16 MED ORDER — KETAMINE HCL 10 MG/ML IJ SOLN
INTRAMUSCULAR | Status: AC
  Filled 2018-10-16: qty 1

## 2018-10-16 MED ORDER — ALVIMOPAN 12 MG PO CAPS
12.0000 mg | ORAL_CAPSULE | ORAL | Status: AC
  Administered 2018-10-16: 12 mg via ORAL
  Filled 2018-10-16: qty 1

## 2018-10-16 MED ORDER — PROPOFOL 10 MG/ML IV BOLUS
INTRAVENOUS | Status: AC
  Filled 2018-10-16: qty 20

## 2018-10-16 MED ORDER — EPHEDRINE 5 MG/ML INJ
INTRAVENOUS | Status: AC
  Filled 2018-10-16: qty 10

## 2018-10-16 MED ORDER — ALPRAZOLAM 0.5 MG PO TABS: 0.5000 mg | ORAL_TABLET | Freq: Every evening | ORAL | Status: DC | PRN

## 2018-10-16 MED ORDER — FENTANYL CITRATE (PF) 250 MCG/5ML IJ SOLN
INTRAMUSCULAR | Status: AC
  Filled 2018-10-16: qty 5

## 2018-10-16 MED ORDER — DULOXETINE HCL 60 MG PO CPEP
90.0000 mg | ORAL_CAPSULE | Freq: Every day | ORAL | Status: DC
  Administered 2018-10-16 – 2018-10-17 (×2): 90 mg via ORAL
  Filled 2018-10-16 (×2): qty 1

## 2018-10-16 MED ORDER — PHENYLEPHRINE HCL 10 MG/ML IJ SOLN
INTRAMUSCULAR | Status: AC
  Filled 2018-10-16: qty 2

## 2018-10-16 MED ORDER — SACCHAROMYCES BOULARDII 250 MG PO CAPS
250.0000 mg | ORAL_CAPSULE | Freq: Two times a day (BID) | ORAL | Status: DC
  Administered 2018-10-16 – 2018-10-19 (×5): 250 mg via ORAL
  Filled 2018-10-16 (×5): qty 1

## 2018-10-16 MED ORDER — KETOROLAC TROMETHAMINE 30 MG/ML IJ SOLN
30.0000 mg | Freq: Four times a day (QID) | INTRAMUSCULAR | Status: DC | PRN
  Administered 2018-10-16 – 2018-10-18 (×6): 30 mg via INTRAVENOUS
  Filled 2018-10-16 (×6): qty 1

## 2018-10-16 MED ORDER — HEPARIN SODIUM (PORCINE) 5000 UNIT/ML IJ SOLN
5000.0000 [IU] | Freq: Once | INTRAMUSCULAR | Status: AC
  Administered 2018-10-16: 5000 [IU] via SUBCUTANEOUS
  Filled 2018-10-16: qty 1

## 2018-10-16 MED ORDER — LIDOCAINE 2% (20 MG/ML) 5 ML SYRINGE
INTRAMUSCULAR | Status: AC
  Filled 2018-10-16: qty 5

## 2018-10-16 MED ORDER — EPINEPHRINE 0.3 MG/0.3ML IJ SOAJ: 0.3000 mg | Freq: Every day | INTRAMUSCULAR | Status: DC | PRN

## 2018-10-16 MED ORDER — SODIUM CHLORIDE 0.9 % IV SOLN
INTRAVENOUS | Status: DC | PRN
  Administered 2018-10-16: 50 ug/min via INTRAVENOUS

## 2018-10-16 MED ORDER — ONDANSETRON HCL 4 MG PO TABS
4.0000 mg | ORAL_TABLET | Freq: Four times a day (QID) | ORAL | Status: DC | PRN
  Administered 2018-10-18: 4 mg via ORAL
  Filled 2018-10-16: qty 1

## 2018-10-16 MED ORDER — KETAMINE HCL 10 MG/ML IJ SOLN
INTRAMUSCULAR | Status: DC | PRN
  Administered 2018-10-16: 20 mg via INTRAVENOUS

## 2018-10-16 MED ORDER — ONDANSETRON HCL 4 MG/2ML IJ SOLN
INTRAMUSCULAR | Status: DC | PRN
  Administered 2018-10-16: 4 mg via INTRAVENOUS

## 2018-10-16 MED ORDER — ALBUMIN HUMAN 5 % IV SOLN
INTRAVENOUS | Status: DC | PRN
  Administered 2018-10-16: 09:00:00 via INTRAVENOUS

## 2018-10-16 MED ORDER — EPHEDRINE SULFATE-NACL 50-0.9 MG/10ML-% IV SOSY
PREFILLED_SYRINGE | INTRAVENOUS | Status: DC | PRN
  Administered 2018-10-16 (×3): 10 mg via INTRAVENOUS

## 2018-10-16 MED ORDER — ONDANSETRON HCL 4 MG/2ML IJ SOLN
INTRAMUSCULAR | Status: AC
  Filled 2018-10-16: qty 2

## 2018-10-16 MED ORDER — ALVIMOPAN 12 MG PO CAPS
12.0000 mg | ORAL_CAPSULE | Freq: Two times a day (BID) | ORAL | Status: DC
  Administered 2018-10-17 – 2018-10-18 (×3): 12 mg via ORAL
  Filled 2018-10-16 (×2): qty 1

## 2018-10-16 MED ORDER — ENSURE SURGERY PO LIQD
237.0000 mL | Freq: Two times a day (BID) | ORAL | Status: DC
  Administered 2018-10-17 – 2018-10-19 (×5): 237 mL via ORAL
  Filled 2018-10-16 (×6): qty 237

## 2018-10-16 MED ORDER — BUPROPION HCL ER (XL) 150 MG PO TB24
150.0000 mg | ORAL_TABLET | Freq: Every day | ORAL | Status: DC
  Administered 2018-10-17 – 2018-10-19 (×3): 150 mg via ORAL
  Filled 2018-10-16 (×3): qty 1

## 2018-10-16 MED ORDER — ROCURONIUM BROMIDE 10 MG/ML (PF) SYRINGE
PREFILLED_SYRINGE | INTRAVENOUS | Status: AC
  Filled 2018-10-16: qty 10

## 2018-10-16 MED ORDER — NITROGLYCERIN 0.4 MG SL SUBL: 0.4000 mg | SUBLINGUAL_TABLET | SUBLINGUAL | Status: DC | PRN

## 2018-10-16 MED ORDER — SUGAMMADEX SODIUM 200 MG/2ML IV SOLN
INTRAVENOUS | Status: AC
  Filled 2018-10-16: qty 2

## 2018-10-16 MED ORDER — OXYCODONE HCL 5 MG PO TABS: 5.0000 mg | ORAL_TABLET | Freq: Once | ORAL | Status: DC | PRN

## 2018-10-16 MED ORDER — DEXTROSE-NACL 5-0.9 % IV SOLN
INTRAVENOUS | Status: DC
  Administered 2018-10-16 – 2018-10-18 (×4): via INTRAVENOUS

## 2018-10-16 MED ORDER — ACETAMINOPHEN 500 MG PO TABS
1000.0000 mg | ORAL_TABLET | ORAL | Status: AC
  Administered 2018-10-16: 1000 mg via ORAL
  Filled 2018-10-16: qty 2

## 2018-10-16 MED ORDER — PREGABALIN 75 MG PO CAPS
75.0000 mg | ORAL_CAPSULE | Freq: Two times a day (BID) | ORAL | Status: DC
  Administered 2018-10-16 – 2018-10-19 (×4): 75 mg via ORAL
  Filled 2018-10-16 (×5): qty 1

## 2018-10-16 MED ORDER — HYDROMORPHONE HCL 1 MG/ML IJ SOLN
INTRAMUSCULAR | Status: AC
  Filled 2018-10-16: qty 1

## 2018-10-16 MED ORDER — VITAMIN D 25 MCG (1000 UNIT) PO TABS
2000.0000 [IU] | ORAL_TABLET | Freq: Two times a day (BID) | ORAL | Status: DC
  Administered 2018-10-16 – 2018-10-19 (×5): 2000 [IU] via ORAL
  Filled 2018-10-16 (×5): qty 2

## 2018-10-16 MED ORDER — OXYCODONE HCL 5 MG/5ML PO SOLN
5.0000 mg | Freq: Once | ORAL | Status: DC | PRN
  Filled 2018-10-16: qty 5

## 2018-10-16 MED ORDER — FERROUS SULFATE 325 (65 FE) MG PO TABS
325.0000 mg | ORAL_TABLET | Freq: Every day | ORAL | Status: DC
  Administered 2018-10-17 – 2018-10-19 (×3): 325 mg via ORAL
  Filled 2018-10-16 (×3): qty 1

## 2018-10-16 MED ORDER — PROMETHAZINE HCL 25 MG/ML IJ SOLN: 6.2500 mg | INTRAMUSCULAR | Status: DC | PRN

## 2018-10-16 MED ORDER — MIDAZOLAM HCL 5 MG/5ML IJ SOLN
INTRAMUSCULAR | Status: DC | PRN
  Administered 2018-10-16: 2 mg via INTRAVENOUS

## 2018-10-16 MED ORDER — ENOXAPARIN SODIUM 40 MG/0.4ML ~~LOC~~ SOLN
40.0000 mg | SUBCUTANEOUS | Status: DC
  Administered 2018-10-17 – 2018-10-19 (×3): 40 mg via SUBCUTANEOUS
  Filled 2018-10-16 (×3): qty 0.4

## 2018-10-16 MED ORDER — SCOPOLAMINE 1 MG/3DAYS TD PT72
MEDICATED_PATCH | TRANSDERMAL | Status: DC | PRN
  Administered 2018-10-16: 1 via TRANSDERMAL

## 2018-10-16 SURGICAL SUPPLY — 53 items
ADH SKN CLS APL DERMABOND .7 (GAUZE/BANDAGES/DRESSINGS)
BNDG GAUZE ELAST 4 BULKY (GAUZE/BANDAGES/DRESSINGS) ×2 IMPLANT
CABLE HIGH FREQUENCY MONO STRZ (ELECTRODE) ×2 IMPLANT
DERMABOND ADVANCED (GAUZE/BANDAGES/DRESSINGS)
DERMABOND ADVANCED .7 DNX12 (GAUZE/BANDAGES/DRESSINGS) ×2 IMPLANT
DRAPE SURG IRRIG POUCH 19X23 (DRAPES) ×2 IMPLANT
ELECT PENCIL ROCKER SW 15FT (MISCELLANEOUS) ×2 IMPLANT
ELECT REM PT RETURN 15FT ADLT (MISCELLANEOUS) ×4 IMPLANT
GAUZE SPONGE 2X2 8PLY STRL LF (GAUZE/BANDAGES/DRESSINGS) ×2 IMPLANT
GAUZE SPONGE 4X4 12PLY STRL (GAUZE/BANDAGES/DRESSINGS) ×2 IMPLANT
GLOVE BIOGEL PI IND STRL 7.0 (GLOVE) IMPLANT
GLOVE BIOGEL PI IND STRL 7.5 (GLOVE) IMPLANT
GLOVE BIOGEL PI IND STRL 8 (GLOVE) IMPLANT
GLOVE BIOGEL PI INDICATOR 7.0 (GLOVE) ×8
GLOVE BIOGEL PI INDICATOR 7.5 (GLOVE) ×8
GLOVE BIOGEL PI INDICATOR 8 (GLOVE) ×8
GOWN STRL REUS W/TWL XL LVL3 (GOWN DISPOSABLE) ×16 IMPLANT
KIT BASIN OR (CUSTOM PROCEDURE TRAY) ×4 IMPLANT
KIT SIGMOIDOSCOPE (SET/KITS/TRAYS/PACK) ×2 IMPLANT
LEGGING LITHOTOMY PAIR STRL (DRAPES) ×2 IMPLANT
MARKER SKIN DUAL TIP RULER LAB (MISCELLANEOUS) ×4 IMPLANT
NDL INSUFFLATION 14GA 120MM (NEEDLE) ×2 IMPLANT
NEEDLE INSUFFLATION 14GA 120MM (NEEDLE) ×4 IMPLANT
PACK COLON (CUSTOM PROCEDURE TRAY) ×2 IMPLANT
SCISSORS LAP 5X45 EPIX DISP (ENDOMECHANICALS) ×2 IMPLANT
SET IRRIG TUBING LAPAROSCOPIC (IRRIGATION / IRRIGATOR) ×2 IMPLANT
SHEARS HARMONIC ACE PLUS 36CM (ENDOMECHANICALS) ×2 IMPLANT
SLEEVE SURGEON STRL (DRAPES) ×4 IMPLANT
SLEEVE XCEL OPT CAN 5 100 (ENDOMECHANICALS) ×10 IMPLANT
SOLUTION ANTI FOG 6CC (MISCELLANEOUS) ×4 IMPLANT
SPONGE GAUZE 2X2 STER 10/PKG (GAUZE/BANDAGES/DRESSINGS)
SPONGE LAP 18X18 X RAY DECT (DISPOSABLE) ×2 IMPLANT
STAPLER CIRC CVD 29MM 37CM (STAPLE) ×2 IMPLANT
SUT MNCRL AB 4-0 PS2 18 (SUTURE) ×2 IMPLANT
SUT NOVA NAB DX-16 0-1 5-0 T12 (SUTURE) ×4 IMPLANT
SUT PDS AB 2-0 CT2 27 (SUTURE) ×2 IMPLANT
SUT PROLENE 0 SH 30 (SUTURE) ×2 IMPLANT
SUT SILK 2 0 (SUTURE) ×4
SUT SILK 2 0 SH CR/8 (SUTURE) ×2 IMPLANT
SUT SILK 2-0 18XBRD TIE 12 (SUTURE) IMPLANT
SUT SILK 3 0 (SUTURE) ×4
SUT SILK 3 0 SH CR/8 (SUTURE) ×2 IMPLANT
SUT SILK 3-0 FS1 18XBRD (SUTURE) IMPLANT
SUT VIC AB 4-0 PS2 27 (SUTURE) IMPLANT
TAPE CLOTH SURG 4X10 WHT LF (GAUZE/BANDAGES/DRESSINGS) ×2 IMPLANT
TOWEL OR 17X26 10 PK STRL BLUE (TOWEL DISPOSABLE) ×4 IMPLANT
TOWEL OR NON WOVEN STRL DISP B (DISPOSABLE) ×4 IMPLANT
TRAY FOLEY SLVR 16FR LF STAT (SET/KITS/TRAYS/PACK) ×2 IMPLANT
TROCAR BLADELESS OPT 5 100 (ENDOMECHANICALS) ×4 IMPLANT
TROCAR XCEL BLUNT TIP 100MML (ENDOMECHANICALS) IMPLANT
TROCAR XCEL NON-BLD 11X100MML (ENDOMECHANICALS) IMPLANT
TUBING CONNECTING 10 (TUBING) ×2 IMPLANT
TUBING CONNECTING 10' (TUBING) ×2

## 2018-10-16 NOTE — Anesthesia Postprocedure Evaluation (Signed)
Anesthesia Post Note  Patient: [name]  Procedure(s) Performed: LAPAROSCOPY DIAGNOSTIC WITH LYSIS OF ADHESIONS (N/A Abdomen) HARTMANN'S COLOSTOMY REVERSAL, RIGID PROCTOSCOPY (N/A )     Patient location during evaluation: PACU Anesthesia Type: General Level of consciousness: awake and alert Pain management: pain level controlled Vital Signs Assessment: post-procedure vital signs reviewed and stable Respiratory status: spontaneous breathing, nonlabored ventilation, respiratory function stable and patient connected to nasal cannula oxygen Cardiovascular status: blood pressure returned to baseline and stable Postop Assessment: no apparent nausea or vomiting Anesthetic complications: no    Last Vitals:  Vitals:   10/16/18 1345 10/16/18 1448  BP: (!) 120/59 (!) 114/59  Pulse: 75 75  Resp: 16 18  Temp: 37 C 37.1 C  SpO2: 94% 97%    Last Pain:  Vitals:   10/16/18 1448  TempSrc: Oral  PainSc:                  [name] [name]

## 2018-10-16 NOTE — Anesthesia Procedure Notes (Addendum)
Procedure Name: Intubation Date/Time: 10/16/2018 8:46 AM Performed by: Victoriano Lain, CRNA Pre-anesthesia Checklist: Patient identified, Emergency Drugs available, Suction available, Patient being monitored and Timeout performed Patient Re-evaluated:Patient Re-evaluated prior to induction Oxygen Delivery Method: Circle system utilized Preoxygenation: Pre-oxygenation with 100% oxygen Induction Type: IV induction Ventilation: Mask ventilation without difficulty Laryngoscope Size: Mac and 4 Grade View: Grade I Tube type: Oral Tube size: 7.5 mm Number of attempts: 2 Airway Equipment and Method: Stylet Placement Confirmation: ETT inserted through vocal cords under direct vision,  positive ETCO2 and breath sounds checked- equal and bilateral Secured at: 21 cm Tube secured with: Tape Dental Injury: Teeth and Oropharynx as per pre-operative assessment  Comments: Neck neutrality was maintained due to patient's prior neck surgeries.

## 2018-10-16 NOTE — Transfer of Care (Signed)
Immediate Anesthesia Transfer of Care Note  Patient: [name]  Procedure(s) Performed: LAPAROSCOPY DIAGNOSTIC WITH LYSIS OF ADHESIONS (N/A Abdomen) HARTMANN'S COLOSTOMY REVERSAL (N/A )  Patient Location: PACU  Anesthesia Type:General  Level of Consciousness: awake, alert , oriented and patient cooperative  Airway & Oxygen Therapy: Patient Spontanous Breathing and Patient connected to face mask oxygen  Post-op Assessment: Report given to RN, Post -op Vital signs reviewed and stable and Patient moving all extremities  Post vital signs: Reviewed and stable  Last Vitals:  Vitals Value Taken Time  BP 145/66 10/16/2018 12:27 PM  Temp    Pulse 85 10/16/2018 12:30 PM  Resp 16 10/16/2018 12:30 PM  SpO2 100 % 10/16/2018 12:30 PM  Vitals shown include unvalidated device data.  Last Pain:  Vitals:   10/16/18 0646  TempSrc: Oral      Patients Stated Pain Goal: 4 (61/53/79 4327)  Complications: No apparent anesthesia complications

## 2018-10-16 NOTE — Op Note (Addendum)
10/16/2018  12:08 PM  PATIENT:  [name]  62 y.o. female  PRE-OPERATIVE DIAGNOSIS:  COLOSTOMY  POST-OPERATIVE DIAGNOSIS:  COLOSTOMY REVERSAL  PROCEDURE:  Procedure(s): LAPAROSCOPY DIAGNOSTIC WITH LYSIS OF ADHESIONS x53min (N/A) HARTMANN'S COLOSTOMY REVERSAL (N/A) Splenic flexure mobilization  SURGEON:  Surgeon(s) and Role:    Ralene Ok, MD - Primary  ASSISTANTS: none   ANESTHESIA:   local and general  EBL:  minimal   BLOOD ADMINISTERED:none  DRAINS: none   LOCAL MEDICATIONS USED:  BUPIVICAINE   SPECIMEN:  No Specimen  DISPOSITION OF SPECIMEN:  N/A  COUNTS:  YES  TOURNIQUET:  * No tourniquets in log *  DICTATION: .Dragon Dictation  Complications: none  Counts: reported as correct x 2   Findings: Pt had a 29 EEA stapled anastomosis. The anastomosis was under no tension.  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  Details of the procedure:  The patient was taken back to the operating room. The patient was placed in supine position with bilateral SCDs in place.  The patient was prepped and draped in the usual sterile fashion. After appropriate anitbiotics were confirmed, a time-out was confirmed and all facts were verified. A pneumoperitoneum of 14 mmHg was obtained via Veress needle technique in the right subcostal margin. Subsequently this a 5 mm camera and trocar were then introduced there's no acute intra-abdominal organ injury. There seemed to be a moderate amount of adhesions to the anterior midline. At this time the left upper quadrant 5 mm trocar was in place a direct visualization. At this time proceeded to take down the omental adhesions to the anterior abdominal wall.   A 5 mm trochar was placed in the midline under direct visualization. These adhesions were in the midline and down towards the pelvis. Lysis of adhesions took approximately 90 minutes. A fourth working trocar was then placed in the right  lower quadrant margin under direct visualization. At this time I was able to take down the peritoneal adhesions from the ostomy.   The omentum was taken down from the transverse colon.  I proceeded in a proximal to distal direction of the colon to take down the splenic flexure.  This allowed the L colon to be medialized and help lengthen the sigmoid colon.  Once the adhesions around the colostomy were taken down we turned our attention to the pelvis. There were a minimal amount of small bowel to small bowel adhesions. The rectum was identified with a dilator but the stapled line could not be seen. We proceeded to take down the ostomy from the skin level.   An elliptical incision was made around the colostomy. The peritoneum was entered which assisted in dissecting away the ostomy from the surrounding tissue.  At this time sizers were used to size the colon. A size 29 sizer fit without undue tension.  We again checked for adequate length of the colon, which was adequate and with undue tension. The EEA stapler was advanced. Secondary to the length of the rectal stump we proceeded to deploy the spike of the EEA stapler through the anterior portion of the rectal stump, approximately 2-3 cm from the staple line.   At this time the anvil was advanced over the spike and the EEA stapler was deployed in the standard fashion. Upon examining the donuts there were 2 complete donuts. We proceeded this time possible test. There was a negative bubble leak test.   The colon protocol was  followed.  At this time the posterior rectus sheath of the ostomy site was closed with a 2-0 PDS in a running standard fashion x1.  The anterior fascia was reapproximated with interrupted #1 novafils The midline was reapproximated using #1 PDS in a running standard fashion x2. We examined the peritoneal cavity with laparoscopy and there were no omental adhesions or bowel was caught within the suture lines. This time the  pneumoperitoneum was evacuated. 2-0 nylons x3 were used and placed over the skin ostomy site, these were not sutured down. Betadine soaked kerlix was used and placed into the wound. The midline wound and all trocar sites were reapproximated using skin staples.   The patient was taken to the recovery room in stable condition.   PLAN OF CARE: Admit to inpatient   PATIENT DISPOSITION:  PACU - hemodynamically stable.   Delay start of Pharmacological VTE agent (>24hrs) due to surgical blood loss or risk of bleeding: not applicable

## 2018-10-16 NOTE — H&P (Signed)
History of Present Illness The patient is a 62 year old female presenting for a post-operative visit. 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 Patient is been doing well with her midline wound. She continues with wet-to-dry dressing changes. Patient has been doing with her colostomy.  Patient is ready to discuss reversal of her colostomy. Patient previously has seen Dr. Si Gaul for colonoscopies. --------------------------------   Patient has been doing well since her last clinic visit. Patient had some small granulation tissue just to the left upper ostomy site. Patient continues to change her midline wound twice a day. This appears to be healing well.  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 Patient has been doing well since her last clinic visit. She states her antibiotics. She states she is afebrile at home. She has had some slow GI transit time. She did take a stool softeners which causes nausea, and abdominal cramping. Patient has continue with twice a day dressing changes. Ostomy appears to be working well.   Allergies  Cipro *FLUOROQUINOLONES*  Minocycline HCl *TETRACYCLINES*  Ambien *HYPNOTICS/SEDATIVES/SLEEP DISORDER AGENTS*  Latex Gloves *MEDICAL DEVICES AND SUPPLIES*  Allergies Reconciled   Medication History  ALPRAZolam (1MG  Tablet, Oral) Active. Lyrica (75MG  Capsule, Oral) Active. Dexilant (60MG  Capsule DR, Oral) Active. Meloxicam (15MG  Tablet, Oral) Active. Metoprolol Succinate ER (25MG  Tablet ER 24HR, Oral) Active. Venlafaxine HCl ER (37.5MG  Capsule ER 24HR, Oral) Active. Levothyroxine Sodium (137MCG Tablet, Oral) Active. BuPROPion HCl ER (XL) (150MG  Tablet ER 24HR, Oral) Active. Medications  Reconciled    Review of Systems  General Present- Fatigue. Not Present- Appetite Loss, Chills, Fever, Night Sweats, Weight Gain and Weight Loss. Skin Not Present- Change in Wart/Mole, Dryness, Hives, Jaundice, New Lesions, Non-Healing Wounds, Rash and Ulcer. HEENT Present- Seasonal Allergies and Wears glasses/contact lenses. Not Present- Earache, Hearing Loss, Hoarseness, Nose Bleed, Oral Ulcers, Ringing in the Ears, Sinus Pain, Sore Throat, Visual Disturbances and Yellow Eyes. Respiratory Present- Snoring. Not Present- Bloody sputum, Chronic Cough, Difficulty Breathing and Wheezing. Gastrointestinal Present- Abdominal Pain. Not Present- Bloating, Bloody Stool, Change in Bowel Habits, Chronic diarrhea, Constipation, Difficulty Swallowing, Excessive gas, Gets full quickly at meals, Hemorrhoids, Indigestion, Nausea, Rectal Pain and Vomiting. Female Genitourinary Not Present- Frequency, Nocturia, Painful Urination, Pelvic Pain and Urgency. Musculoskeletal Present- Joint Pain and Muscle Pain. Not Present- Back Pain, Joint Stiffness, Muscle Weakness and Swelling of Extremities. Neurological Present- Weakness. Not Present- Decreased Memory, Fainting, Headaches, Numbness, Seizures, Tingling, Tremor and Trouble walking. Psychiatric Not Present- Anxiety, Bipolar, Change in Sleep Pattern, Depression, Fearful and Frequent crying. Endocrine Present- Hot flashes. Not Present- Cold Intolerance, Excessive Hunger, Hair Changes, Heat Intolerance and New Diabetes. Hematology Not Present- Blood Thinners, Easy Bruising, Excessive bleeding, Gland problems, HIV BP 140/61   Pulse 68   Temp 98.3 F (36.8 C) (Oral)   Resp 16   Ht 5\' 5"  (1.651 m)   Wt 69.8 kg   SpO2 99%   BMI 25.59 kg/m      Physical Exam  The physical exam findings are as follows: Note:Constitutional: No acute distress, conversant, appears stated age  Eyes: Anicteric sclerae, moist conjunctiva, no lid lag  Neck: No thyromegaly,  trachea midline, no cervical lymphadenopathy  Lungs: Clear to auscultation biilaterally, normal respiratory effot  Cardiovascular: regular rate & rhythm, no murmurs, no peripheal edema, pedal pulses 2+  GI: Soft, no masses or hepatosplenomegaly, non-tender to  palpation  MSK: Normal gait, no clubbing cyanosis, edema  Skin: No rashes, palpation reveals normal skin turgor  Psychiatric: Appropriate judgment and insight, oriented to person, place, and time  General Note: Alert, oriented, in no acute distress   Chest and Lung Exam Note: Effort normal   Abdomen Note: Soft, non-tender, non-distended Stoma on left side, functioning Midline wound with clean base,     Assessment & Plan  S/P EXPLORATORY LAPAROTOMY (E28.003) Impression: 62 year old female 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 1. We'll have the patient undergo a contrast enema to evaluate the anatomy 2. We'll have the patient referred back to Dr. Carma Leaven for screening colonoscopy 3. Once we have received his results we'll proceed with diagnostic laparoscopy, lysis of adhesions, possible exploratory laparoscopy for Hartmann's reversal 4. Discussed with the patient the risks and benefits of the procedure to include but not limited to: Infection, bleeding, damage to structures, possible need for further surgery and/or ileostomy. The patient was understanding and wishes to proceed.

## 2018-10-17 ENCOUNTER — Encounter (HOSPITAL_COMMUNITY): Payer: Self-pay | Admitting: General Surgery

## 2018-10-17 LAB — CBC
HCT: 32.6 % — ABNORMAL LOW (ref 36.0–46.0)
Hemoglobin: 10.3 g/dL — ABNORMAL LOW (ref 12.0–15.0)
MCH: 28.9 pg (ref 26.0–34.0)
MCHC: 31.6 g/dL (ref 30.0–36.0)
MCV: 91.6 fL (ref 80.0–100.0)
Platelets: 357 10*3/uL (ref 150–400)
RBC: 3.56 MIL/uL — ABNORMAL LOW (ref 3.87–5.11)
RDW: 15.5 % (ref 11.5–15.5)
WBC: 13.6 10*3/uL — AB (ref 4.0–10.5)
nRBC: 0 % (ref 0.0–0.2)

## 2018-10-17 LAB — BASIC METABOLIC PANEL
Anion gap: 8 (ref 5–15)
BUN: 11 mg/dL (ref 8–23)
CO2: 25 mmol/L (ref 22–32)
CREATININE: 1 mg/dL (ref 0.44–1.00)
Calcium: 8.9 mg/dL (ref 8.9–10.3)
Chloride: 108 mmol/L (ref 98–111)
GFR calc Af Amer: >60 mL/min (ref >60)
GFR calc non Af Amer: 59 mL/min — ABNORMAL LOW (ref >60)
Glucose, Bld: 120 mg/dL — ABNORMAL HIGH (ref 70–99)
Potassium: 4.1 mmol/L (ref 3.5–5.1)
SODIUM: 141 mmol/L (ref 135–145)

## 2018-10-17 NOTE — Progress Notes (Signed)
Dr Rosendo Gros here; port site bleeding again. Dr Rosendo Gros assessed and asked that it be redressed with 4x4s and an ABD pad. Dressing done per his request. Donne Hazel, RN

## 2018-10-17 NOTE — Progress Notes (Signed)
Uppermost port site dressing saturated with blood again. Dressing changed and Ice pack applied. Dr Rosendo Gros paged. Top layer of other dressings changed due to being soiled with blood. Donne Hazel, RN

## 2018-10-17 NOTE — Progress Notes (Signed)
1 Day Post-Op   Subjective/Chief Complaint: Pt doing great Ambulating in hall   Objective: Vital signs in last 24 hours: Temp:  [97.8 F (36.6 C)-98.9 F (37.2 C)] 98.7 F (37.1 C) (12/19 0628) Pulse Rate:  [71-84] 73 (12/19 0628) Resp:  [11-18] 18 (12/19 0628) BP: (76-145)/(48-68) 119/54 (12/19 0628) SpO2:  [93 %-100 %] 95 % (12/19 0628) Weight:  [72.6 kg] 72.6 kg (12/19 0628) Last BM Date: 10/16/18  Intake/Output from previous day: 12/18 0701 - 12/19 0700 In: 4328.8 [P.O.:360; I.V.:3368.8; IV Piggyback:600] Out: 1100 [Urine:1075; Blood:25] Intake/Output this shift: No intake/output data recorded.  General appearance: alert and cooperative GI: soft, non-tender; bowel sounds normal; no masses,  no organomegaly and inc c/d/i  Lab Results:  Recent Labs    10/14/18 1005 10/17/18 0444  WBC 12.9* 13.6*  HGB 14.0 10.3*  HCT 45.0 32.6*  PLT 479* 357   BMET Recent Labs    10/14/18 1005 10/17/18 0444  NA 139 141  K 4.2 4.1  CL 102 108  CO2 25 25  GLUCOSE 81 120*  BUN 11 11  CREATININE 0.91 1.00  CALCIUM 9.7 8.9   Anti-infectives: Anti-infectives (From admission, onward)   Start     Dose/Rate Route Frequency Ordered Stop   10/16/18 2100  cefoTEtan (CEFOTAN) 2 g in sodium chloride 0.9 % 100 mL IVPB     2 g 200 mL/hr over 30 Minutes Intravenous Every 12 hours 10/16/18 1345 10/16/18 2047   10/16/18 0630  cefoTEtan (CEFOTAN) 2 g in sodium chloride 0.9 % 100 mL IVPB     2 g 200 mL/hr over 30 Minutes Intravenous On call to O.R. 10/16/18 0786 10/16/18 0919      Assessment/Plan: s/p Procedure(s): LAPAROSCOPY DIAGNOSTIC WITH LYSIS OF ADHESIONS (N/A) HARTMANN'S COLOSTOMY REVERSAL, RIGID PROCTOSCOPY (N/A) -con't mobilize -con't to adv diet as tol -await bowel function    LOS: 1 day    [name] 10/17/2018

## 2018-10-17 NOTE — Progress Notes (Signed)
Patient requested neb treatment; Respiratory paged and will come to give treatment. Donne Hazel, RN

## 2018-10-17 NOTE — Progress Notes (Signed)
Uppermost port site bleeding; dressing changed. With patient movement, same site began bleeding again. Benzoin and steri strips applied, followed by gauze 4x4 (folded); Dr Rosendo Gros notified. Will continue to monitor. Donne Hazel, RN

## 2018-10-18 LAB — CBC WITH DIFFERENTIAL/PLATELET
Abs Immature Granulocytes: 0.09 10*3/uL — ABNORMAL HIGH (ref 0.00–0.07)
BASOS PCT: 0 %
Basophils Absolute: 0 10*3/uL (ref 0.0–0.1)
Eosinophils Absolute: 0.1 10*3/uL (ref 0.0–0.5)
Eosinophils Relative: 1 %
HCT: 32.1 % — ABNORMAL LOW (ref 36.0–46.0)
Hemoglobin: 10.4 g/dL — ABNORMAL LOW (ref 12.0–15.0)
Immature Granulocytes: 1 %
Lymphocytes Relative: 3 %
Lymphs Abs: 0.5 10*3/uL — ABNORMAL LOW (ref 0.7–4.0)
MCH: 29.1 pg (ref 26.0–34.0)
MCHC: 32.4 g/dL (ref 30.0–36.0)
MCV: 89.7 fL (ref 80.0–100.0)
Monocytes Absolute: 0.6 10*3/uL (ref 0.1–1.0)
Monocytes Relative: 4 %
NEUTROS PCT: 91 %
Neutro Abs: 15.9 10*3/uL — ABNORMAL HIGH (ref 1.7–7.7)
PLATELETS: 327 10*3/uL (ref 150–400)
RBC: 3.58 MIL/uL — ABNORMAL LOW (ref 3.87–5.11)
RDW: 15.7 % — ABNORMAL HIGH (ref 11.5–15.5)
WBC: 17.3 10*3/uL — ABNORMAL HIGH (ref 4.0–10.5)
nRBC: 0 % (ref 0.0–0.2)

## 2018-10-18 LAB — CBC
HCT: 29.1 % — ABNORMAL LOW (ref 36.0–46.0)
Hemoglobin: 9.2 g/dL — ABNORMAL LOW (ref 12.0–15.0)
MCH: 29.1 pg (ref 26.0–34.0)
MCHC: 31.6 g/dL (ref 30.0–36.0)
MCV: 92.1 fL (ref 80.0–100.0)
Platelets: 266 10*3/uL (ref 150–400)
RBC: 3.16 MIL/uL — ABNORMAL LOW (ref 3.87–5.11)
RDW: 15.7 % — ABNORMAL HIGH (ref 11.5–15.5)
WBC: 9.2 10*3/uL (ref 4.0–10.5)
nRBC: 0 % (ref 0.0–0.2)

## 2018-10-18 MED ORDER — ACETAMINOPHEN 325 MG PO TABS
650.0000 mg | ORAL_TABLET | Freq: Four times a day (QID) | ORAL | Status: DC | PRN
  Administered 2018-10-18 – 2018-10-19 (×2): 650 mg via ORAL
  Filled 2018-10-18 (×2): qty 2

## 2018-10-18 MED ORDER — TRAMADOL HCL 50 MG PO TABS: 50.0000 mg | ORAL_TABLET | Freq: Four times a day (QID) | ORAL | 0 refills | Status: DC | PRN

## 2018-10-18 MED ORDER — KCL IN DEXTROSE-NACL 20-5-0.9 MEQ/L-%-% IV SOLN
INTRAVENOUS | Status: DC
  Filled 2018-10-18: qty 1000

## 2018-10-18 NOTE — Discharge Instructions (Signed)
SURGERY: POST OP INSTRUCTIONS °(Surgery for small bowel obstruction, colon resection, etc) ° ° °###################################################################### ° °EAT °Gradually transition to a high fiber diet with a fiber supplement over the next few days after discharge ° °WALK °Walk an hour a day.  Control your pain to do that.   ° °CONTROL PAIN °Control pain so that you can walk, sleep, tolerate sneezing/coughing, go up/down stairs. ° °HAVE A BOWEL MOVEMENT DAILY °Keep your bowels regular to avoid problems.  OK to try a laxative to override constipation.  OK to use an antidairrheal to slow down diarrhea.  Call if not better after 2 tries ° °CALL IF YOU HAVE PROBLEMS/CONCERNS °Call if you are still struggling despite following these instructions. °Call if you have concerns not answered by these instructions ° °###################################################################### ° ° °DIET °Follow a light diet the first few days at home.  Start with a bland diet such as soups, liquids, starchy foods, low fat foods, etc.  If you feel full, bloated, or constipated, stay on a ful liquid or pureed/blenderized diet for a few days until you feel better and no longer constipated. °Be sure to drink plenty of fluids every day to avoid getting dehydrated (feeling dizzy, not urinating, etc.). °Gradually add a fiber supplement to your diet over the next week.  Gradually get back to a regular solid diet.  Avoid fast food or heavy meals the first week as you are more likely to get nauseated. °It is expected for your digestive tract to need a few months to get back to normal.  It is common for your bowel movements and stools to be irregular.  You will have occasional bloating and cramping that should eventually fade away.  Until you are eating solid food normally, off all pain medications, and back to regular activities; your bowels will not be normal. °Focus on eating a low-fat, high fiber diet the rest of your life  (See Getting to Good Bowel Health, below). ° °CARE of your INCISION or WOUND °It is good for closed incision and even open wounds to be washed every day.  Shower every day.  Short baths are fine.  Wash the incisions and wounds clean with soap & water.    °If you have a closed incision(s), wash the incision with soap & water every day.  You may leave closed incisions open to air if it is dry.   You may cover the incision with clean gauze & replace it after your daily shower for comfort. °If you have skin tapes (Steristrips) or skin glue (Dermabond) on your incision, leave them in place.  They will fall off on their own like a scab.  You may trim any edges that curl up with clean scissors.  If you have staples, set up an appointment for them to be removed in the office in 10 days after surgery.  °If you have a drain, wash around the skin exit site with soap & water and place a new dressing of gauze or band aid around the skin every day.  Keep the drain site clean & dry.    °If you have an open wound with packing, see wound care instructions.  In general, it is encouraged that you remove your dressing and packing, shower with soap & water, and replace your dressing once a day.  Pack the wound with clean gauze moistened with normal (0.9%) saline to keep the wound moist & uninfected.  Pressure on the dressing for 30 minutes will stop most wound   bleeding.  Eventually your body will heal & pull the open wound closed over the next few months.  °Raw open wounds will occasionally bleed or secrete yellow drainage until it heals closed.  Drain sites will drain a little until the drain is removed.  Even closed incisions can have mild bleeding or drainage the first few days until the skin edges scab over & seal.   °If you have an open wound with a wound vac, see wound vac care instructions. ° ° ° ° °ACTIVITIES as tolerated °Start light daily activities --- self-care, walking, climbing stairs-- beginning the day after surgery.   Gradually increase activities as tolerated.  Control your pain to be active.  Stop when you are tired.  Ideally, walk several times a day, eventually an hour a day.   °Most people are back to most day-to-day activities in a few weeks.  It takes 4-8 weeks to get back to unrestricted, intense activity. °If you can walk 30 minutes without difficulty, it is safe to try more intense activity such as jogging, treadmill, bicycling, low-impact aerobics, swimming, etc. °Save the most intensive and strenuous activity for last (Usually 4-8 weeks after surgery) such as sit-ups, heavy lifting, contact sports, etc.  Refrain from any intense heavy lifting or straining until you are off narcotics for pain control.  You will have off days, but things should improve week-by-week. °DO NOT PUSH THROUGH PAIN.  Let pain be your guide: If it hurts to do something, don't do it.  Pain is your body warning you to avoid that activity for another week until the pain goes down. °You may drive when you are no longer taking narcotic prescription pain medication, you can comfortably wear a seatbelt, and you can safely make sudden turns/stops to protect yourself without hesitating due to pain. °You may have sexual intercourse when it is comfortable. If it hurts to do something, stop. ° °MEDICATIONS °Take your usually prescribed home medications unless otherwise directed.   °Blood thinners:  °Usually you can restart any strong blood thinners after the second postoperative day.  It is OK to take aspirin right away.    ° If you are on strong blood thinners (warfarin/Coumadin, Plavix, Xerelto, Eliquis, Pradaxa, etc), discuss with your surgeon, medicine PCP, and/or cardiologist for instructions on when to restart the blood thinner & if blood monitoring is needed (PT/INR blood check, etc).   ° ° °PAIN CONTROL °Pain after surgery or related to activity is often due to strain/injury to muscle, tendon, nerves and/or incisions.  This pain is usually  short-term and will improve in a few months.  °To help speed the process of healing and to get back to regular activity more quickly, DO THE FOLLOWING THINGS TOGETHER: °1. Increase activity gradually.  DO NOT PUSH THROUGH PAIN °2. Use Ice and/or Heat °3. Try Gentle Massage and/or Stretching °4. Take over the counter pain medication °5. Take Narcotic prescription pain medication for more severe pain ° °Good pain control = faster recovery.  It is better to take more medicine to be more active than to stay in bed all day to avoid medications. °1.  Increase activity gradually °Avoid heavy lifting at first, then increase to lifting as tolerated over the next 6 weeks. °Do not “push through” the pain.  Listen to your body and avoid positions and maneuvers than reproduce the pain.  Wait a few days before trying something more intense °Walking an hour a day is encouraged to help your body recover faster   and more safely.  Start slowly and stop when getting sore.  If you can walk 30 minutes without stopping or pain, you can try more intense activity (running, jogging, aerobics, cycling, swimming, treadmill, sex, sports, weightlifting, etc.) °Remember: If it hurts to do it, then don’t do it! °2. Use Ice and/or Heat °You will have swelling and bruising around the incisions.  This will take several weeks to resolve. °Ice packs or heating pads (6-8 times a day, 30-60 minutes at a time) will help sooth soreness & bruising. °Some people prefer to use ice alone, heat alone, or alternate between ice & heat.  Experiment and see what works best for you.  Consider trying ice for the first few days to help decrease swelling and bruising; then, switch to heat to help relax sore spots and speed recovery. °Shower every day.  Short baths are fine.  It feels good!  Keep the incisions and wounds clean with soap & water.   °3. Try Gentle Massage and/or Stretching °Massage at the area of pain many times a day °Stop if you feel pain - do not  overdo it °4. Take over the counter pain medication °This helps the muscle and nerve tissues become less irritable and calm down faster °Choose ONE of the following over-the-counter anti-inflammatory medications: °Acetaminophen 500mg tabs (Tylenol) 1-2 pills with every meal and just before bedtime (avoid if you have liver problems or if you have acetaminophen in you narcotic prescription) °Naproxen 220mg tabs (ex. Aleve, Naprosyn) 1-2 pills twice a day (avoid if you have kidney, stomach, IBD, or bleeding problems) °Ibuprofen 200mg tabs (ex. Advil, Motrin) 3-4 pills with every meal and just before bedtime (avoid if you have kidney, stomach, IBD, or bleeding problems) °Take with food/snack several times a day as directed for at least 2 weeks to help keep pain / soreness down & more manageable. °5. Take Narcotic prescription pain medication for more severe pain °A prescription for strong pain control is often given to you upon discharge (for example: oxycodone/Percocet, hydrocodone/Norco/Vicodin, or tramadol/Ultram) °Take your pain medication as prescribed. °Be mindful that most narcotic prescriptions contain Tylenol (acetaminophen) as well - avoid taking too much Tylenol. °If you are having problems/concerns with the prescription medicine (does not control pain, nausea, vomiting, rash, itching, etc.), please call us [phone] to see if we need to switch you to a different pain medicine that will work better for you and/or control your side effects better. °If you need a refill on your pain medication, you must call the office before 4 pm and on weekdays only.  By federal law, prescriptions for narcotics cannot be called into a pharmacy.  They must be filled out on paper & picked up from our office by the patient or authorized caretaker.  Prescriptions cannot be filled after 4 pm nor on weekends.   ° °WHEN TO CALL US [phone] °Severe uncontrolled or worsening pain  °Fever over 101 F (38.5 C) °Concerns with  the incision: Worsening pain, redness, rash/hives, swelling, bleeding, or drainage °Reactions / problems with new medications (itching, rash, hives, nausea, etc.) °Nausea and/or vomiting °Difficulty urinating °Difficulty breathing °Worsening fatigue, dizziness, lightheadedness, blurred vision °Other concerns °If you are not getting better after two weeks or are noticing you are getting worse, contact our office [phone] for further advice.  We may need to adjust your medications, re-evaluate you in the office, send you to the emergency room, or see what other things we can do to help. °The   clinic staff is available to answer your questions during regular business hours (8:30am-5pm).  Please don’t hesitate to call and ask to speak to one of our nurses for clinical concerns.    °A surgeon from [facility] is always on call at the hospitals 24 hours/day °If you have a medical emergency, go to the nearest emergency room or call 911. ° °FOLLOW UP in our office °One the day of your discharge from the hospital (or the next business weekday), please call [facility] to set up or confirm an appointment to see your surgeon in the office for a follow-up appointment.  Usually it is 2-3 weeks after your surgery.   °If you have skin staples at your incision(s), let the office know so we can set up a time in the office for the nurse to remove them (usually around 10 days after surgery). °Make sure that you call for appointments the day of discharge (or the next business weekday) from the hospital to ensure a convenient appointment time. °IF YOU HAVE DISABILITY OR FAMILY LEAVE FORMS, BRING THEM TO THE OFFICE FOR PROCESSING.  DO NOT GIVE THEM TO YOUR DOCTOR. ° °[facility] °[address], [address], [place], [place]  [address] ? °[phone] - Main °[phone] - Toll Free,  [phone] - Fax °[email or web address] ° °GETTING TO GOOD BOWEL HEALTH. °It is  expected for your digestive tract to need a few months to get back to normal.  It is common for your bowel movements and stools to be irregular.  You will have occasional bloating and cramping that should eventually fade away.  Until you are eating solid food normally, off all pain medications, and back to regular activities; your bowels will not be normal.   °Avoiding constipation °The goal: ONE SOFT BOWEL MOVEMENT A DAY!    °Drink plenty of fluids.  Choose water first. °TAKE A FIBER SUPPLEMENT EVERY DAY THE REST OF YOUR LIFE °During your first week back home, gradually add back a fiber supplement every day °Experiment which form you can tolerate.   There are many forms such as powders, tablets, wafers, gummies, etc °Psyllium bran (Metamucil), methylcellulose (Citrucel), Miralax or Glycolax, Benefiber, Flax Seed.  °Adjust the dose week-by-week (1/2 dose/day to 6 doses a day) until you are moving your bowels 1-2 times a day.  Cut back the dose or try a different fiber product if it is giving you problems such as diarrhea or bloating. °Sometimes a laxative is needed to help jump-start bowels if constipated until the fiber supplement can help regulate your bowels.  If you are tolerating eating & you are farting, it is okay to try a gentle laxative such as double dose MiraLax, prune juice, or Milk of Magnesia.  Avoid using laxatives too often. °Stool softeners can sometimes help counteract the constipating effects of narcotic pain medicines.  It can also cause diarrhea, so avoid using for too long. °If you are still constipated despite taking fiber daily, eating solids, and a few doses of laxatives, call our office. °Controlling diarrhea °Try drinking liquids and eating bland foods for a few days to avoid stressing your intestines further. °Avoid dairy products (especially milk & ice cream) for a short time.  The intestines often can lose the ability to digest lactose when stressed. °Avoid foods that cause gassiness or  bloating.  Typical foods include beans and other legumes, cabbage, broccoli, and dairy foods.  Avoid greasy, spicy, fast foods.  Every person has   some sensitivity to other foods, so listen to your body and avoid those foods that trigger problems for you. °Probiotics (such as active yogurt, Align, etc) may help repopulate the intestines and colon with normal bacteria and calm down a sensitive digestive tract °Adding a fiber supplement gradually can help thicken stools by absorbing excess fluid and retrain the intestines to act more normally.  Slowly increase the dose over a few weeks.  Too much fiber too soon can backfire and cause cramping & bloating. °It is okay to try and slow down diarrhea with a few doses of antidiarrheal medicines.   °Bismuth subsalicylate (ex. Kayopectate, Pepto Bismol) for a few doses can help control diarrhea.  Avoid if pregnant.   °Loperamide (Imodium) can slow down diarrhea.  Start with one tablet (2mg) first.  Avoid if you are having fevers or severe pain.  °ILEOSTOMY PATIENTS WILL HAVE CHRONIC DIARRHEA since their colon is not in use.    °Drink plenty of liquids.  You will need to drink even more glasses of water/liquid a day to avoid getting dehydrated. °Record output from your ileostomy.  Expect to empty the bag every 3-4 hours at first.  Most people with a permanent ileostomy empty their bag 4-6 times at the least.   °Use antidiarrheal medicine (especially Imodium) several times a day to avoid getting dehydrated.  Start with a dose at bedtime & breakfast.  Adjust up or down as needed.  Increase antidiarrheal medications as directed to avoid emptying the bag more than 8 times a day (every 3 hours). °Work with your wound ostomy nurse to learn care for your ostomy.  See ostomy care instructions. °TROUBLESHOOTING IRREGULAR BOWELS °1) Start with a soft & bland diet. No spicy, greasy, or fried foods.  °2) Avoid gluten/wheat or dairy products from diet to see if symptoms improve. °3) Miralax  17gm or flax seed mixed in 8oz. water or juice-daily. May use 2-4 times a day as needed. °4) Gas-X, Phazyme, etc. as needed for gas & bloating.  °5) Prilosec (omeprazole) over-the-counter as needed °6)  Consider probiotics (Align, Activa, etc) to help calm the bowels down ° °Call your doctor if you are getting worse or not getting better.  Sometimes further testing (cultures, endoscopy, X-ray studies, CT scans, bloodwork, etc.) may be needed to help diagnose and treat the cause of the diarrhea. °[facility] °[address], [address], [place], [place]  [address] °[phone] - Main.    °[phone]  - Toll Free.   [phone] - Fax °[email or web address] ° ° °

## 2018-10-19 LAB — BASIC METABOLIC PANEL
Anion gap: 11 (ref 5–15)
BUN: 9 mg/dL (ref 8–23)
CO2: 23 mmol/L (ref 22–32)
Calcium: 8.6 mg/dL — ABNORMAL LOW (ref 8.9–10.3)
Chloride: 99 mmol/L (ref 98–111)
Creatinine, Ser: 0.72 mg/dL (ref 0.44–1.00)
GFR calc Af Amer: >60 mL/min (ref >60)
GFR calc non Af Amer: >60 mL/min (ref >60)
GLUCOSE: 143 mg/dL — AB (ref 70–99)
Potassium: 2.8 mmol/L — ABNORMAL LOW (ref 3.5–5.1)
Sodium: 133 mmol/L — ABNORMAL LOW (ref 135–145)

## 2018-10-19 NOTE — Progress Notes (Addendum)
Patient spiked fever of 103.1, pulse 116, BP 158/71. On call doctor notified, reinforced spirometer use. Verbal orders given by Dr. Hassell Done for D5W 0.9% Nacl with KCl 20 mEq. 650 Tylenol, CBC with Diff, Basic metabolic panel, and blood culture to be performed. IV restarted and fluids and tylenol given.

## 2018-10-19 NOTE — Progress Notes (Signed)
3 Days Post-Op  Late note entry  Subjective/Chief Complaint: Pt ambulating well IV fell out but tol PO well   Objective: Vital signs in last 24 hours: Temp:  [98.7 F (37.1 C)-103.1 F (39.5 C)] 98.7 F (37.1 C) (12/21 0534) Pulse Rate:  [56-116] 82 (12/21 0534) Resp:  [14-16] 16 (12/21 0534) BP: (94-171)/(51-82) 94/51 (12/21 0534) SpO2:  [96 %-100 %] 99 % (12/21 0534) Weight:  [72.6 kg] 72.6 kg (12/21 0534) Last BM Date: 10/18/18  Intake/Output from previous day: 12/20 0701 - 12/21 0700 In: 1914.1 [P.O.:1040; I.V.:874.1] Out: 1100 [Urine:1100] Intake/Output this shift: Total I/O In: 1174.1 [P.O.:300; I.V.:874.1] Out: 1100 [Urine:1100]  General appearance: alert and cooperative GI: soft, non-tender; bowel sounds normal; no masses,  no organomegaly  Lab Results:  Recent Labs    10/18/18 0740 10/18/18 2305  WBC 9.2 17.3*  HGB 9.2* 10.4*  HCT 29.1* 32.1*  PLT 266 327   BMET Recent Labs    10/17/18 0444 10/18/18 2305  NA 141 133*  K 4.1 2.8*  CL 108 99  CO2 25 23  GLUCOSE 120* 143*  BUN 11 9  CREATININE 1.00 0.72  CALCIUM 8.9 8.6*   Anti-infectives: Anti-infectives (From admission, onward)   Start     Dose/Rate Route Frequency Ordered Stop   10/16/18 2100  cefoTEtan (CEFOTAN) 2 g in sodium chloride 0.9 % 100 mL IVPB     2 g 200 mL/hr over 30 Minutes Intravenous Every 12 hours 10/16/18 1345 10/16/18 2047   10/16/18 0630  cefoTEtan (CEFOTAN) 2 g in sodium chloride 0.9 % 100 mL IVPB     2 g 200 mL/hr over 30 Minutes Intravenous On call to O.R. 10/16/18 0627 10/16/18 0919      Assessment/Plan: s/p Procedure(s): LAPAROSCOPY DIAGNOSTIC WITH LYSIS OF ADHESIONS (N/A) HARTMANN'S COLOSTOMY REVERSAL, RIGID PROCTOSCOPY (N/A) Advance diet  Ambulate Home Sat AM  LOS: 3 days    [name] 10/19/2018

## 2018-10-19 NOTE — Discharge Summary (Signed)
Physician Discharge Summary  Patient ID: [name] MRN: [id number] DOB/AGE: January 19, 1956 62 y.o.  Admit date: 10/16/2018 Discharge date: 10/19/2018  Admission Diagnoses: Hartman's colostomy  Discharge Diagnoses:  Active Problems:   S/P colostomy takedown   Discharged Condition: good  Hospital Course: Patient was admitted postoperatively and placed on the rest protocol.  Patient did well postoperatively.  She was ambulating on the day of surgery.  Patient continued to ambulate well with good pain control.  Patient did have a bowel movement on postop day 2.  Patient did have a fever on postop night 2.  Patient was given Tylenol and defervesced.  On postop day 3 patient felt normal without any abdominal pain, no fevers or chills.  Patient otherwise felt well overall.  She was tolerating her diet.  Patient was deemed stable for discharge and discharged home.  Consults: None  Significant Diagnostic Studies: None  Treatments: surgery: As above  Discharge Exam: Blood pressure (!) 94/51, pulse 82, temperature 98.7 F (37.1 C), temperature source Oral, resp. rate 16, height 5\' 5"  (1.651 m), weight 72.6 kg, SpO2 99 %. General appearance: alert and cooperative GI: soft, non-tender; bowel sounds normal; no masses,  no organomegaly and Incisions clean dry and intact  Disposition: Discharge disposition: 01-Home or Self Care       Discharge Instructions    Diet - low sodium heart healthy   Complete by:  As directed    Increase activity slowly   Complete by:  As directed      Allergies as of 10/19/2018      Reactions   Ambien [zolpidem Tartrate] Other (See Comments)   Hallucinations   Bee Venom Itching, Shortness Of Breath, Swelling   Minocycline Rash   Peanut-containing Drug Products Anaphylaxis   Tape Itching, Rash, Swelling   Ciprofloxacin Nausea And Vomiting   Latex Rash   Shellfish Allergy Rash      Medication List    TAKE these medications   acetaminophen 500 MG  tablet Commonly known as:  TYLENOL Take 2 tablets (1,000 mg total) by mouth every 6 (six) hours as needed.   albuterol 108 (90 Base) MCG/ACT inhaler Commonly known as:  PROVENTIL HFA;VENTOLIN HFA Inhale 2 puffs into the lungs every 4 (four) hours as needed for wheezing or shortness of breath.   ALPRAZolam 1 MG tablet Commonly known as:  XANAX Take 0.5-1 mg by mouth at bedtime as needed for anxiety or sleep.   aspirin EC 81 MG tablet Take 1 tablet (81 mg total) by mouth daily.   atorvastatin 10 MG tablet Commonly known as:  LIPITOR Take 1 tablet (10 mg total) by mouth daily. What changed:  when to take this   buPROPion 150 MG 24 hr tablet Commonly known as:  WELLBUTRIN XL Take 150 mg by mouth daily.   clindamycin 1 % gel Commonly known as:  CLINDAGEL Apply 1 application topically daily.   dexlansoprazole 60 MG capsule Commonly known as:  DEXILANT Take 60 mg by mouth daily.   diclofenac sodium 1 % Gel Commonly known as:  VOLTAREN Apply 2 g topically 3 (three) times daily as needed (pain).   DULoxetine 30 MG capsule Commonly known as:  CYMBALTA Take 90 mg by mouth at bedtime.   EPINEPHrine 0.3 mg/0.3 mL Soaj injection Commonly known as:  EPI-PEN Inject 0.3 mg into the muscle daily as needed (allergic reaction).   ferrous sulfate 325 (65 FE) MG tablet Take 325 mg by mouth daily with breakfast.   fluticasone-salmeterol  115-21 MCG/ACT inhaler Commonly known as:  ADVAIR HFA Inhale 2 puffs into the lungs 2 (two) times daily as needed ((ASTHMA)).   furosemide 40 MG tablet Commonly known as:  LASIX Take 1 tablet (40 mg total) by mouth daily.   levothyroxine 137 MCG tablet Commonly known as:  SYNTHROID, LEVOTHROID Take 137 mcg by mouth daily before breakfast.   LYRICA 75 MG capsule Generic drug:  pregabalin Take 75 mg by mouth 2 (two) times daily.   methocarbamol 500 MG tablet Commonly known as:  ROBAXIN Take 1 tablet (500 mg total) by mouth every 8 (eight) hours  as needed for muscle spasms.   metoprolol succinate 25 MG 24 hr tablet Commonly known as:  TOPROL-XL Take 1 tablet (25 mg total) by mouth daily.   metroNIDAZOLE 1 % gel Commonly known as:  METROGEL Apply 1 application topically 2 (two) times daily.   nitroGLYCERIN 0.4 MG SL tablet Commonly known as:  NITROSTAT Place 1 tablet (0.4 mg total) under the tongue every 5 (five) minutes as needed for chest pain.   ondansetron 8 MG tablet Commonly known as:  ZOFRAN Take 8 mg by mouth every 8 (eight) hours as needed for nausea or vomiting.   oxyCODONE 5 MG immediate release tablet Commonly known as:  Oxy IR/ROXICODONE Take 1 tablet (5 mg total) by mouth every 4 (four) hours as needed for moderate pain.   potassium chloride SA 20 MEQ tablet Commonly known as:  K-DUR,KLOR-CON Take 1 tablet (20 mEq total) by mouth daily.   traMADol 50 MG tablet Commonly known as:  ULTRAM Take 1 tablet (50 mg total) by mouth every 6 (six) hours as needed.   vitamin B-12 1000 MCG tablet Commonly known as:  CYANOCOBALAMIN Take 1,000 mcg by mouth daily.   Vitamin D3 50 MCG (2000 UT) capsule Take 2,000 Units by mouth daily. Takes one in the morning and one at night      Follow-up Information    Ralene Ok, MD Follow up.   Specialty:  General Surgery Contact information: [address] [place]  [address] [phone]           Signed: Ralene Ok 10/19/2018, 6:59 AM

## 2018-10-19 NOTE — Progress Notes (Signed)
Patient labs results in, potassium 2.8, sodium 133. Doctor notified, will get order for potassium in the AM, patient currently has fluids with KCl 20 mEq running

## 2018-10-25 DIAGNOSIS — E876 Hypokalemia: Secondary | ICD-10-CM | POA: Diagnosis not present

## 2018-10-25 DIAGNOSIS — G629 Polyneuropathy, unspecified: Secondary | ICD-10-CM | POA: Diagnosis not present

## 2018-10-25 DIAGNOSIS — Z6825 Body mass index (BMI) 25.0-25.9, adult: Secondary | ICD-10-CM | POA: Diagnosis not present

## 2018-10-25 DIAGNOSIS — B029 Zoster without complications: Secondary | ICD-10-CM | POA: Diagnosis not present

## 2018-10-25 DIAGNOSIS — Z9889 Other specified postprocedural states: Secondary | ICD-10-CM | POA: Diagnosis not present

## 2018-11-04 DIAGNOSIS — Z1231 Encounter for screening mammogram for malignant neoplasm of breast: Secondary | ICD-10-CM | POA: Diagnosis not present

## 2018-11-06 DIAGNOSIS — N390 Urinary tract infection, site not specified: Secondary | ICD-10-CM | POA: Diagnosis not present

## 2018-12-11 DIAGNOSIS — R0681 Apnea, not elsewhere classified: Secondary | ICD-10-CM | POA: Diagnosis not present

## 2018-12-11 DIAGNOSIS — G2581 Restless legs syndrome: Secondary | ICD-10-CM | POA: Diagnosis not present

## 2018-12-19 ENCOUNTER — Other Ambulatory Visit: Payer: Self-pay | Admitting: Cardiology

## 2018-12-27 DIAGNOSIS — M205X2 Other deformities of toe(s) (acquired), left foot: Secondary | ICD-10-CM | POA: Diagnosis not present

## 2018-12-27 DIAGNOSIS — I7300 Raynaud's syndrome without gangrene: Secondary | ICD-10-CM | POA: Diagnosis not present

## 2018-12-27 DIAGNOSIS — L03032 Cellulitis of left toe: Secondary | ICD-10-CM | POA: Diagnosis not present

## 2018-12-27 DIAGNOSIS — M898X7 Other specified disorders of bone, ankle and foot: Secondary | ICD-10-CM | POA: Diagnosis not present

## 2018-12-30 DIAGNOSIS — G4733 Obstructive sleep apnea (adult) (pediatric): Secondary | ICD-10-CM | POA: Diagnosis not present

## 2018-12-30 IMAGING — US US RENAL
1 series · 14 of 25 positions shown · non-contrast
Comparison: None.

CLINICAL DATA: Acute kidney injury

EXAM:
RENAL / URINARY TRACT ULTRASOUND COMPLETE

[Series 1: us renal · 0.23mm/px · 14 of 27 slices shown]
[im 1/27]
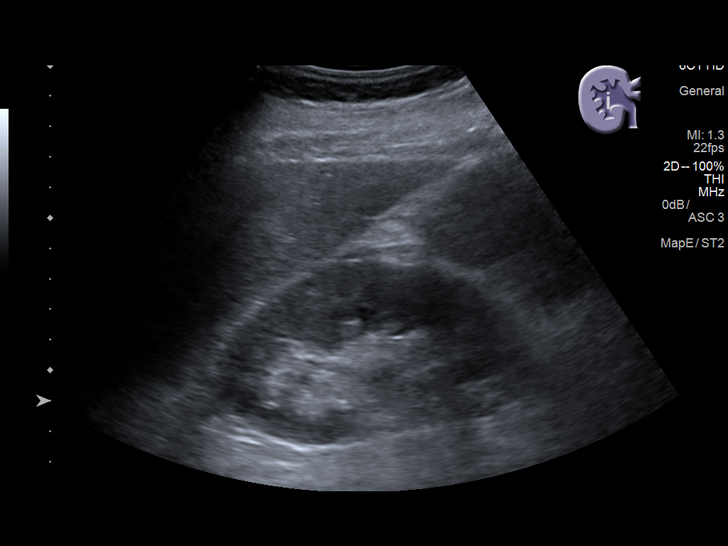
[im 3/27]
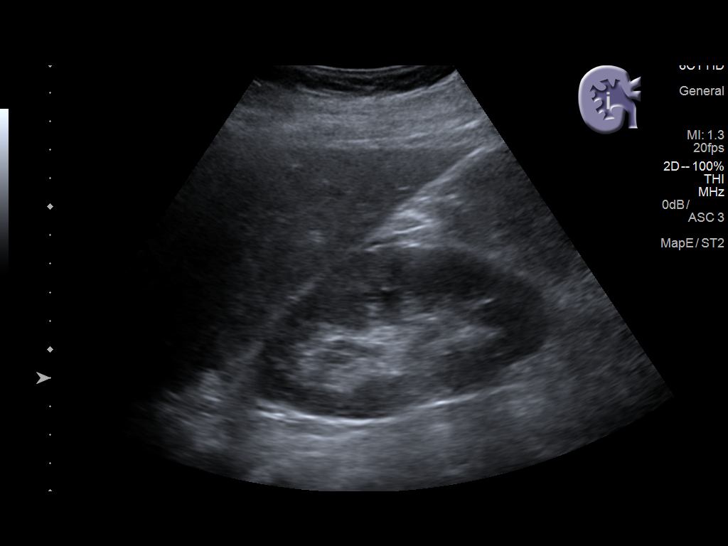
[im 5/27]
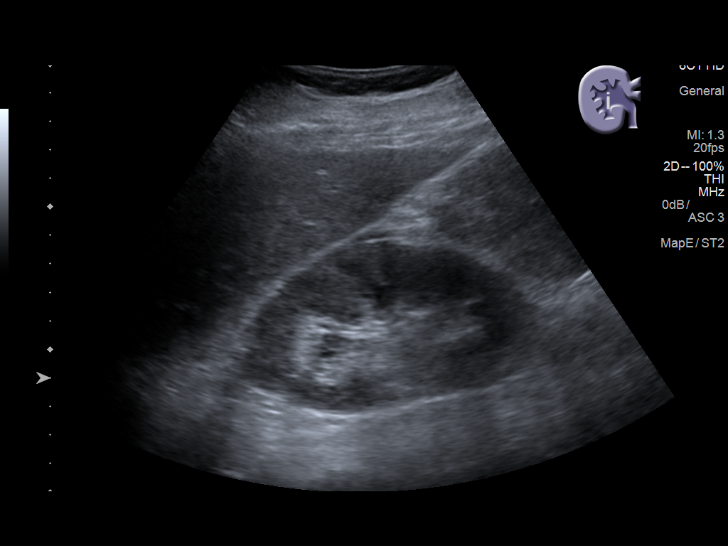
[im 7/27]
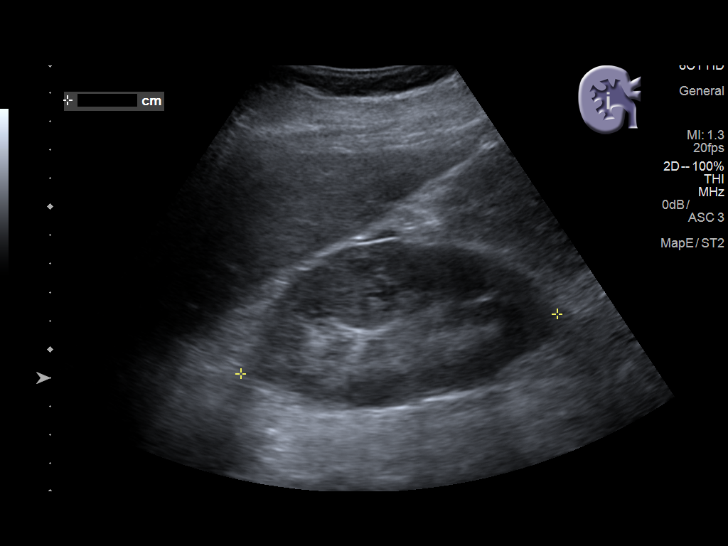
[im 9/27]
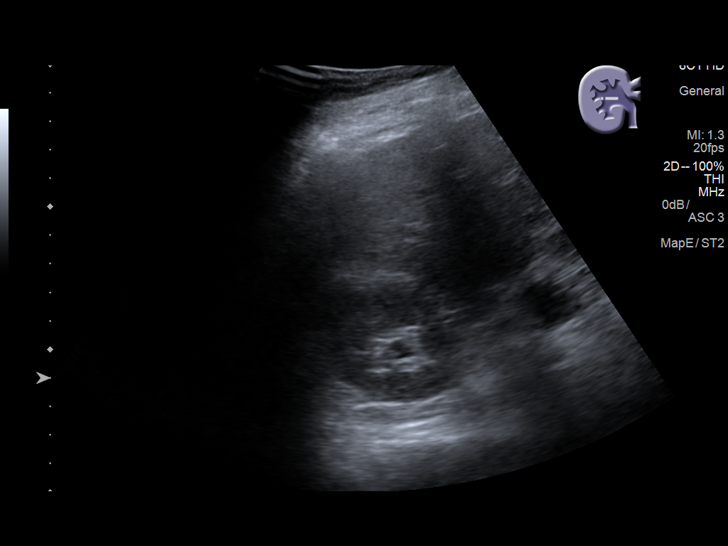
[im 10/27]
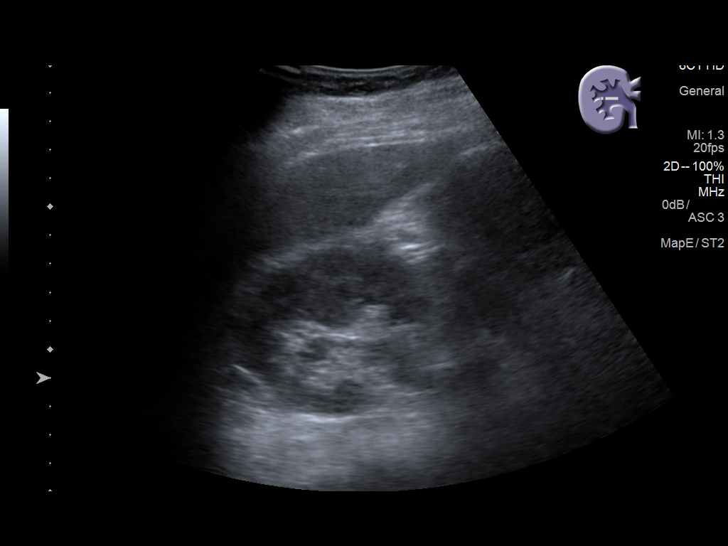
[im 12/27]
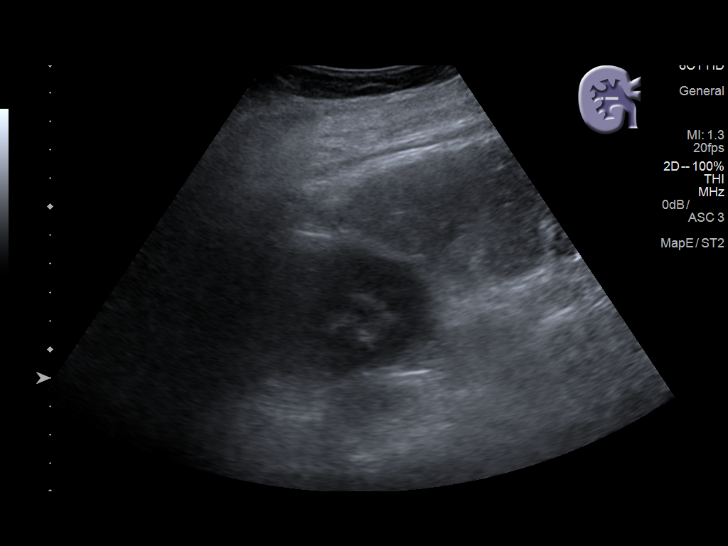
[im 15/27]
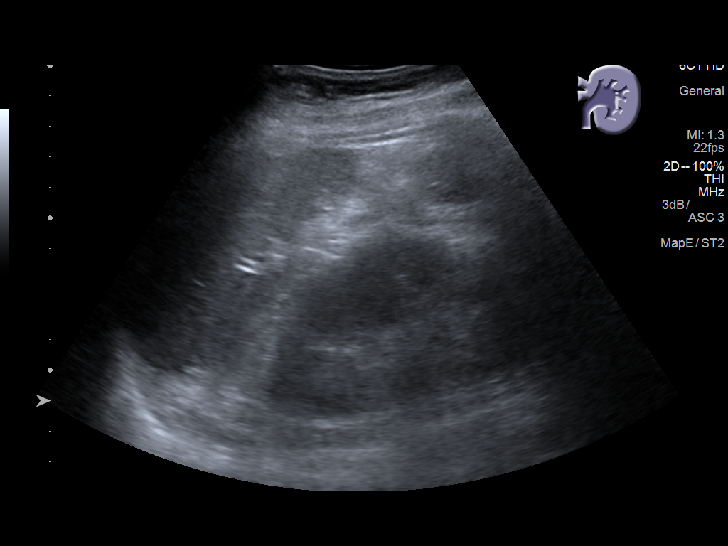
[im 17/27]
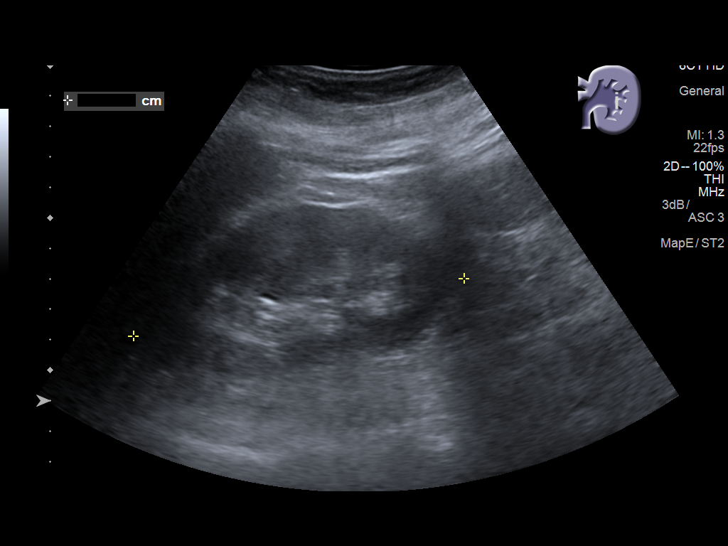
[im 18/27]
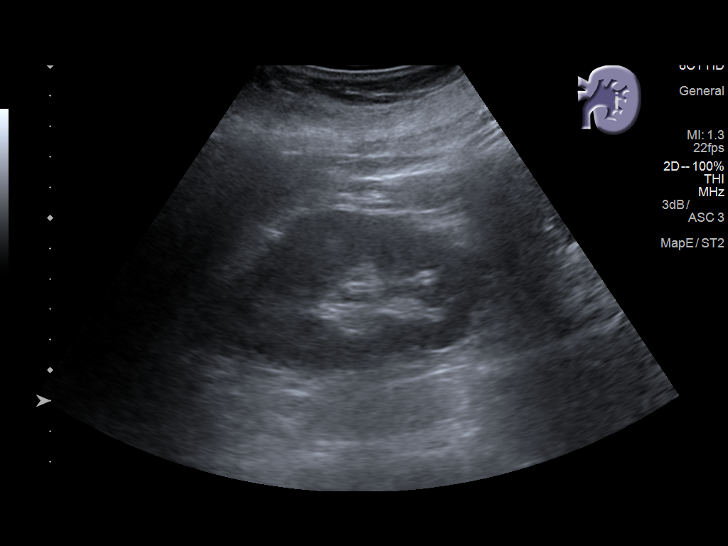
[im 20/27]
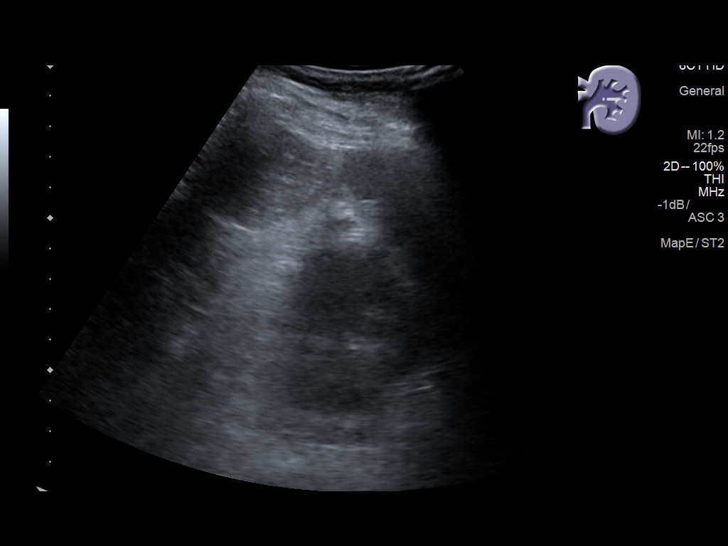
[im 22/27]
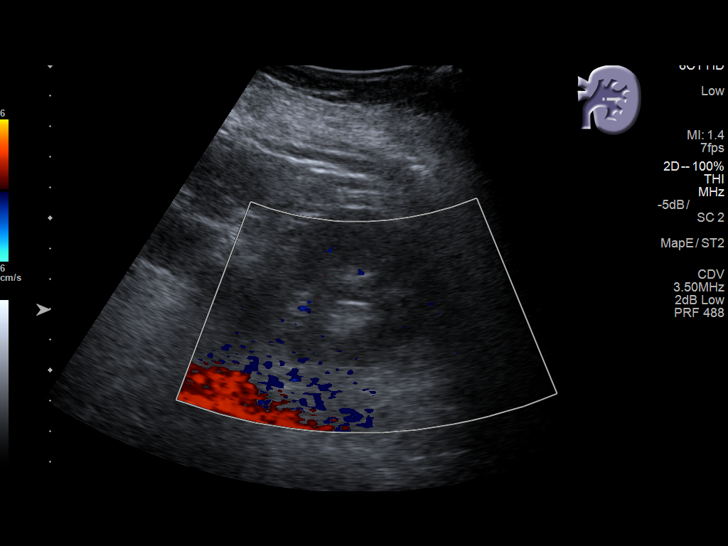
[im 24/27]
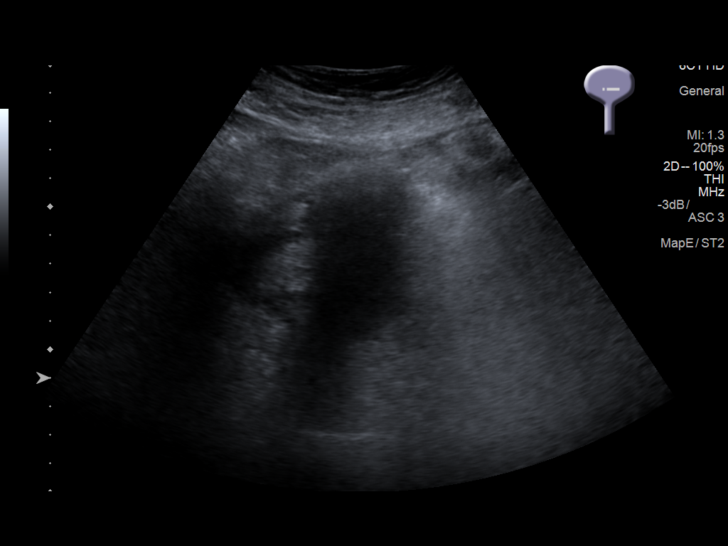
[im 27/27]
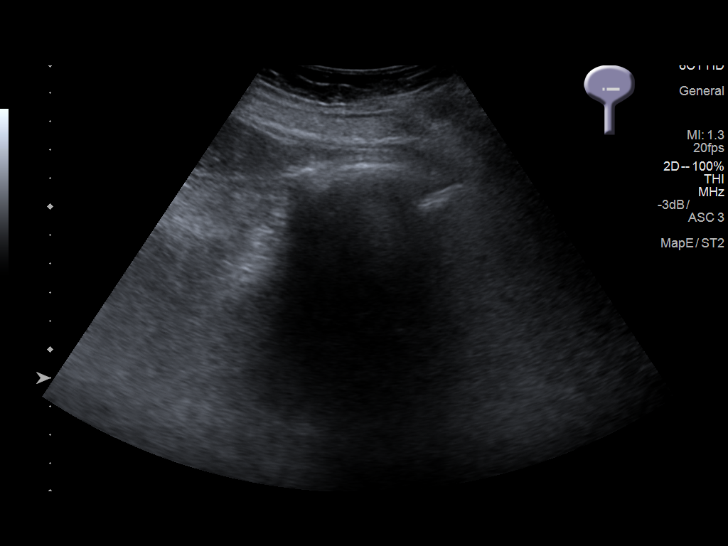

[14 of 25 positions shown; findings below may reference images not displayed]

FINDINGS: Right Kidney:

Length: 11.3 cm. Echogenicity within normal limits. No mass or
hydronephrosis visualized.

Left Kidney:

Length: 11 cm. Echogenicity within normal limits. No mass or
hydronephrosis visualized.

Bladder:

Appears normal for degree of bladder distention.
IMPRESSION: Normal renal ultrasound.

## 2018-12-31 ENCOUNTER — Other Ambulatory Visit: Payer: Self-pay | Admitting: Cardiology

## 2019-01-06 ENCOUNTER — Ambulatory Visit: Payer: Medicare Other | Admitting: Cardiology

## 2019-01-06 DIAGNOSIS — K432 Incisional hernia without obstruction or gangrene: Secondary | ICD-10-CM | POA: Diagnosis not present

## 2019-01-07 DIAGNOSIS — M542 Cervicalgia: Secondary | ICD-10-CM | POA: Diagnosis not present

## 2019-01-08 ENCOUNTER — Other Ambulatory Visit: Payer: Self-pay | Admitting: General Surgery

## 2019-01-08 DIAGNOSIS — K432 Incisional hernia without obstruction or gangrene: Secondary | ICD-10-CM

## 2019-01-09 ENCOUNTER — Ambulatory Visit
Admission: RE | Admit: 2019-01-09 | Discharge: 2019-01-09 | Disposition: A | Discharge status: Home / Self Care | Payer: Medicare Other | Source: Ambulatory Visit | Attending: General Surgery | Admitting: General Surgery

## 2019-01-09 DIAGNOSIS — Z8542 Personal history of malignant neoplasm of other parts of uterus: Secondary | ICD-10-CM | POA: Diagnosis not present

## 2019-01-09 DIAGNOSIS — K432 Incisional hernia without obstruction or gangrene: Secondary | ICD-10-CM

## 2019-01-15 DIAGNOSIS — K432 Incisional hernia without obstruction or gangrene: Secondary | ICD-10-CM | POA: Diagnosis not present

## 2019-01-24 ENCOUNTER — Ambulatory Visit: Payer: Self-pay | Admitting: General Surgery

## 2019-02-12 DIAGNOSIS — K219 Gastro-esophageal reflux disease without esophagitis: Secondary | ICD-10-CM | POA: Diagnosis not present

## 2019-02-12 DIAGNOSIS — F325 Major depressive disorder, single episode, in full remission: Secondary | ICD-10-CM | POA: Diagnosis not present

## 2019-02-12 DIAGNOSIS — G629 Polyneuropathy, unspecified: Secondary | ICD-10-CM | POA: Diagnosis not present

## 2019-02-12 DIAGNOSIS — Z79899 Other long term (current) drug therapy: Secondary | ICD-10-CM | POA: Diagnosis not present

## 2019-02-12 DIAGNOSIS — D649 Anemia, unspecified: Secondary | ICD-10-CM | POA: Diagnosis not present

## 2019-02-12 DIAGNOSIS — E782 Mixed hyperlipidemia: Secondary | ICD-10-CM | POA: Diagnosis not present

## 2019-02-12 DIAGNOSIS — Z0000 Encounter for general adult medical examination without abnormal findings: Secondary | ICD-10-CM | POA: Diagnosis not present

## 2019-02-12 DIAGNOSIS — J45998 Other asthma: Secondary | ICD-10-CM | POA: Diagnosis not present

## 2019-02-12 DIAGNOSIS — F411 Generalized anxiety disorder: Secondary | ICD-10-CM | POA: Diagnosis not present

## 2019-02-12 DIAGNOSIS — G4733 Obstructive sleep apnea (adult) (pediatric): Secondary | ICD-10-CM | POA: Diagnosis not present

## 2019-02-19 ENCOUNTER — Encounter: Payer: Self-pay | Admitting: Cardiology

## 2019-02-19 ENCOUNTER — Other Ambulatory Visit: Payer: Self-pay

## 2019-02-19 ENCOUNTER — Other Ambulatory Visit: Payer: Self-pay | Admitting: Cardiology

## 2019-02-19 ENCOUNTER — Telehealth (INDEPENDENT_AMBULATORY_CARE_PROVIDER_SITE_OTHER): Payer: Medicare Other | Admitting: Cardiology

## 2019-02-19 VITALS — BP 120/67 | HR 75 | Ht 65.0 in | Wt 150.5 lb

## 2019-02-19 DIAGNOSIS — I471 Supraventricular tachycardia: Secondary | ICD-10-CM

## 2019-02-19 NOTE — Progress Notes (Signed)
Virtual Visit via Video Note   This visit type was conducted due to national recommendations for restrictions regarding the COVID-19 Pandemic (e.g. social distancing) in an effort to limit this patient's exposure and mitigate transmission in our community.  Due to her co-morbid illnesses, this patient is at least at moderate risk for complications without adequate follow up.  This format is felt to be most appropriate for this patient at this time.  All issues noted in this document were discussed and addressed.  A limited physical exam was performed with this format.  Please refer to the patient's chart for her consent to telehealth for [facility].   Evaluation Performed:  Follow-up visit  Date:  02/19/2019   ID:  [name], DOB [birth year], MRN [id number]  Patient Location: Home Provider Location: Home  PCP:  Kathyrn Lass, MD  Cardiologist:  Candee Furbish, MD  Electrophysiologist:  None   Chief Complaint: Palpitations follow-up  History of Present Illness:    [name] is a 63 y.o. female with history of paroxysmal atrial tachycardia well-controlled on metoprolol.  She is doing very well with regards to her cardiac cath.  Taking low-dose atorvastatin for prevention, excellent.  Back in 2015 had minimal soft plaque bilateral carotid arteries.  Taking low-dose aspirin as well for secondary prevention.  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  She has a hernia that will be repaired hopefully in June.  She denies any cardiac symptoms.  Reminded me of her sisters cardiovascular illness.    The patient does not have symptoms concerning for COVID-19 infection (fever, chills, cough, or new shortness of breath).    Past Medical History:  Diagnosis Date  . Back pain   . Chronic fatigue syndrome    Dr. Sabra Heck  . Complication of anesthesia   . Cough    /SOB, PFT's nl, improved with Bronchodilation 8/11  . Dysrhythmia   .  Esophageal spasm    GERD Dr. Sabra Heck, Dr. Wynetta Emery; egd 2006 nl; UGI 3/13 [facility] HH, moderate GERD, nl motility; seen at [facility] egd? neg, sone improvement on sucralfate susp  . Fibromyalgia   . Foot pain    Dr. Rushie Nyhan  . High triglycerides   . Hypothyroidism    Dr. Lewis Shock 4/14  . Leukocytoclastic vasculitis ([place])    Dr. Tonia Brooms  . Low HDL (under 40)   . Mitral valve prolapse   . Paroxysmal atrial tachycardia (HCC)    Dr. Marlou Porch  . Pernicious anemia    Dr. Sabra Heck  . PONV (postoperative nausea and vomiting)    along time ago had nausea ans vomiting after surgery-25 years ago  . Reflux    ? delayed gastric emptying--GES nl 01/2012 (6% at 2hrs)  . RLS (restless legs syndrome)    low ferritin Dr. Sabra Heck  . Uterine carcinoma (HCC)    Dr. Polly Cobia   Past Surgical History:  Procedure Laterality Date  . ABDOMINAL HYSTERECTOMY    . bladder tack     x 2  . BUNIONECTOMY    . CHOLECYSTECTOMY    . COLONOSCOPY     scr, 12/2008 ([facility]) nl  . COLOSTOMY Left 06/05/2018   Procedure: COLOSTOMY;  Surgeon: Ralene Ok, MD;  Location: [place];  Service: General;  Laterality: Left;  . COLOSTOMY REVERSAL N/A 10/16/2018   Procedure: HARTMANN'S COLOSTOMY REVERSAL, RIGID PROCTOSCOPY;  Surgeon: Ralene Ok, MD;  Location: [facility];  Service: General;  Laterality: N/A;  .  HAMMER TOE SURGERY    . LAPAROSCOPY N/A 10/16/2018   Procedure: LAPAROSCOPY DIAGNOSTIC WITH LYSIS OF ADHESIONS;  Surgeon: Ralene Ok, MD;  Location: [facility];  Service: General;  Laterality: N/A;  . LUNG BIOPSY     L Lower lung pulm nodules, largest 4.78mm, low risk, repeat CT in 1 yr now following with pulm at [facility]; 9/13 Stable tiny lung nodules, no f/u needed  . METATARSAL OSTEOTOMY    . NECK SURGERY     x 2  . PARTIAL COLECTOMY N/A 06/05/2018   Procedure: EXPLORATORY LAPAROTOMY AND PARTIAL COLECTOMY;  Surgeon: Ralene Ok, MD;  Location: [place];  Service: General;  Laterality: N/A;  . TONSILLECTOMY    . US  ECHOCARDIOGRAPHY     with Nuclear test, Dr. Marlou Porch, Low risk  . VESICOVAGINAL FISTULA CLOSURE W/ TAH       Current Meds  Medication Sig  . acetaminophen (TYLENOL) 500 MG tablet Take 2 tablets (1,000 mg total) by mouth every 6 (six) hours as needed.  Marland Kitchen albuterol (PROVENTIL HFA;VENTOLIN HFA) 108 (90 Base) MCG/ACT inhaler Inhale 2 puffs into the lungs every 4 (four) hours as needed for wheezing or shortness of breath.  Marland Kitchen aspirin EC 81 MG tablet Take 1 tablet (81 mg total) by mouth daily.  Marland Kitchen atorvastatin (LIPITOR) 10 MG tablet TAKE 1 TABLET BY MOUTH  DAILY  . buPROPion (WELLBUTRIN XL) 150 MG 24 hr tablet Take 150 mg by mouth daily.  . Cholecalciferol (VITAMIN D3) 50 MCG (2000 UT) capsule Take 2,000 Units by mouth daily. Takes one in the morning and one at night  . clindamycin (CLINDAGEL) 1 % gel Apply 1 application topically daily.  Marland Kitchen dexlansoprazole (DEXILANT) 60 MG capsule Take 60 mg by mouth daily.  . diclofenac sodium (VOLTAREN) 1 % GEL Apply 2 g topically 3 (three) times daily as needed (pain).   . DULoxetine (CYMBALTA) 30 MG capsule Take 90 mg by mouth at bedtime.   Marland Kitchen EPINEPHrine 0.3 mg/0.3 mL IJ SOAJ injection Inject 0.3 mg into the muscle daily as needed (allergic reaction).   . ferrous sulfate 325 (65 FE) MG tablet Take 325 mg by mouth daily with breakfast.  . fluticasone (FLONASE) 50 MCG/ACT nasal spray Place 1 spray into both nostrils daily.  . fluticasone-salmeterol (ADVAIR HFA) 115-21 MCG/ACT inhaler Inhale 2 puffs into the lungs 2 (two) times daily as needed ((ASTHMA)).   Marland Kitchen levothyroxine (SYNTHROID, LEVOTHROID) 137 MCG tablet Take 137 mcg by mouth daily before breakfast.   . metoprolol succinate (TOPROL-XL) 25 MG 24 hr tablet TAKE 1 TABLET BY MOUTH  DAILY  . metroNIDAZOLE (METROGEL) 1 % gel Apply 1 application topically 2 (two) times daily.   . nitroGLYCERIN (NITROSTAT) 0.4 MG SL tablet Place 1 tablet (0.4 mg total) under the tongue every 5 (five) minutes as needed for chest pain.   . pregabalin (LYRICA) 75 MG capsule Take 75 mg by mouth 2 (two) times daily.  . vitamin B-12 (CYANOCOBALAMIN) 1000 MCG tablet Take 1,000 mcg by mouth daily.     Allergies:   Ambien [zolpidem tartrate]; Bee venom; Minocycline; Peanut-containing drug products; Tape; Ciprofloxacin; Latex; and Shellfish allergy   Social History   Tobacco Use  . Smoking status: Never Smoker  . Smokeless tobacco: Never Used  Substance Use Topics  . Alcohol use: Yes    Comment: maybe a glass of wine/month  . Drug use: No     Family Hx: The patient's family history is not on file.  Sister with cardiovascular  illness  ROS:   Please see the history of present illness.    No current abdominal pain no fevers chills shortness of breath syncope bleeding All other systems reviewed and are negative.   Prior CV studies:   The following studies were reviewed today:  Carotid Dopplers 03/26/2014- minimal soft plaque bilaterally.  Labs/Other Tests and Data Reviewed:    EKG:  An ECG dated 10/14/2018 was personally reviewed today and demonstrated:  Sinus rhythm with no other significant abnormalities  Recent Labs: 06/13/2018: ALT 19; Magnesium 2.0 10/18/2018: BUN 9; Creatinine, Ser 0.72; Hemoglobin 10.4; Platelets 327; Potassium 2.8; Sodium 133   Recent Lipid Panel Lab Results  Component Value Date/Time   CHOL 132 05/23/2016 09:10 AM   TRIG 195 (H) 06/10/2018 04:25 AM   HDL 46 05/23/2016 09:10 AM   CHOLHDL 2.9 05/23/2016 09:10 AM   LDLCALC 40 05/23/2016 09:10 AM    Wt Readings from Last 3 Encounters:  02/19/19 150 lb 8 oz (68.3 kg)  10/19/18 160 lb 0.9 oz (72.6 kg)  10/14/18 153 lb 12.8 oz (69.8 kg)     Objective:    Vital Signs:  BP 120/67   Pulse 75   Ht 5\' 5"  (1.651 m)   Wt 150 lb 8 oz (68.3 kg)   BMI 25.04 kg/m    VITAL SIGNS:  reviewed GEN:  no acute distress EYES:  sclerae anicteric, EOMI - Extraocular Movements Intact RESPIRATORY:  normal respiratory effort, symmetric expansion  CARDIOVASCULAR:  no peripheral edema SKIN:  no rash, lesions or ulcers. MUSCULOSKELETAL:  no obvious deformities. NEURO:  alert and oriented x 3, no obvious focal deficit PSYCH:  normal affect  ASSESSMENT & PLAN:    Paroxysmal atrial tachycardia -Controlled with metoprolol no severe palpitations.  Sigmoid volvulus - Hartman procedure.  She discussed this with me in detail.  She has a residual hernia that will be repaired hopefully in June.  Hyperlipidemia - Statin use no changes.  Has mild carotid plaque bilaterally.  Low-dose aspirin.  Previous atypical chest pain - Continue to monitor.  OSA  - CPAP. Dr. Maxwell Caul.  Had an AHI in the 40s, now down to 10.   COVID-19 Education: The signs and symptoms of COVID-19 were discussed with the patient and how to seek care for testing (follow up with PCP or arrange E-visit).  The importance of social distancing was discussed today.  Time:   Today, I have spent 18 minutes with the patient with telehealth technology discussing the above problems.     Medication Adjustments/Labs and Tests Ordered: Current medicines are reviewed at length with the patient today.  Concerns regarding medicines are outlined above.   Tests Ordered: No orders of the defined types were placed in this encounter.   Medication Changes: No orders of the defined types were placed in this encounter.   Disposition:  Follow up in 1 year(s)  Signed, Candee Furbish, MD  02/19/2019 3:46 PM    [place]

## 2019-02-20 ENCOUNTER — Telehealth: Admit: 2019-02-20 | Discharge: 2019-02-21 | Payer: MEDICARE

## 2019-02-20 DIAGNOSIS — L719 Rosacea, unspecified: Principal | ICD-10-CM

## 2019-02-20 DIAGNOSIS — L282 Other prurigo: Secondary | ICD-10-CM

## 2019-02-20 DIAGNOSIS — M318 Other specified necrotizing vasculopathies: Secondary | ICD-10-CM

## 2019-02-20 MED ORDER — DOXYCYCLINE HYCLATE 20 MG TABLET
ORAL_TABLET | Freq: Two times a day (BID) | ORAL | 0 refills | 0.00000 days | Status: CP
Start: 2019-02-20 — End: 2019-03-22

## 2019-02-20 MED ORDER — AZELAIC ACID 15 % TOPICAL GEL
Freq: Every morning | TOPICAL | 5 refills | 0.00000 days | Status: CP
Start: 2019-02-20 — End: 2020-02-20

## 2019-02-20 MED ORDER — DOXYCYCLINE HYCLATE 100 MG TABLET
ORAL_TABLET | Freq: Two times a day (BID) | ORAL | 1 refills | 0.00000 days | Status: CP
Start: 2019-02-20 — End: 2019-02-20

## 2019-02-20 MED ORDER — CLINDAMYCIN 1 % TOPICAL GEL
5 refills | 0.00000 days | Status: CP
Start: 2019-02-20 — End: ?

## 2019-03-03 ENCOUNTER — Ambulatory Visit: Payer: Medicare Other | Admitting: Cardiology

## 2019-03-04 DIAGNOSIS — R198 Other specified symptoms and signs involving the digestive system and abdomen: Secondary | ICD-10-CM | POA: Diagnosis not present

## 2019-03-10 DIAGNOSIS — E063 Autoimmune thyroiditis: Secondary | ICD-10-CM | POA: Diagnosis not present

## 2019-03-11 DIAGNOSIS — B349 Viral infection, unspecified: Secondary | ICD-10-CM | POA: Diagnosis not present

## 2019-03-12 DIAGNOSIS — B349 Viral infection, unspecified: Secondary | ICD-10-CM | POA: Diagnosis not present

## 2019-03-17 DIAGNOSIS — E039 Hypothyroidism, unspecified: Secondary | ICD-10-CM | POA: Diagnosis not present

## 2019-03-17 DIAGNOSIS — E063 Autoimmune thyroiditis: Secondary | ICD-10-CM | POA: Diagnosis not present

## 2019-03-21 MED ORDER — PREDNISONE 10 MG TABLET
ORAL_TABLET | ORAL | 0 refills | 0.00000 days | Status: CP
Start: 2019-03-21 — End: ?

## 2019-03-26 MED ORDER — ONDANSETRON HCL 8 MG TABLET
ORAL_TABLET | 0 refills | 0 days | Status: CP
Start: 2019-03-26 — End: ?

## 2019-03-27 DIAGNOSIS — F325 Major depressive disorder, single episode, in full remission: Secondary | ICD-10-CM | POA: Diagnosis not present

## 2019-03-27 DIAGNOSIS — G629 Polyneuropathy, unspecified: Secondary | ICD-10-CM | POA: Diagnosis not present

## 2019-03-27 DIAGNOSIS — Z79899 Other long term (current) drug therapy: Secondary | ICD-10-CM | POA: Diagnosis not present

## 2019-03-27 DIAGNOSIS — K219 Gastro-esophageal reflux disease without esophagitis: Secondary | ICD-10-CM | POA: Diagnosis not present

## 2019-03-27 DIAGNOSIS — J45998 Other asthma: Secondary | ICD-10-CM | POA: Diagnosis not present

## 2019-03-27 DIAGNOSIS — D649 Anemia, unspecified: Secondary | ICD-10-CM | POA: Diagnosis not present

## 2019-03-27 DIAGNOSIS — F411 Generalized anxiety disorder: Secondary | ICD-10-CM | POA: Diagnosis not present

## 2019-03-27 DIAGNOSIS — E782 Mixed hyperlipidemia: Secondary | ICD-10-CM | POA: Diagnosis not present

## 2019-03-27 DIAGNOSIS — G4733 Obstructive sleep apnea (adult) (pediatric): Secondary | ICD-10-CM | POA: Diagnosis not present

## 2019-03-31 DIAGNOSIS — G4733 Obstructive sleep apnea (adult) (pediatric): Secondary | ICD-10-CM | POA: Diagnosis not present

## 2019-04-07 NOTE — Pre-Procedure Instructions (Addendum)
[name]  04/07/2019     [facility], [place] [address] Phone: [phone] Fax: [place], [facility] [address] [place] [address] [place] [address] Phone: [phone] Fax: [phone]   Your procedure is scheduled on Wednesday, June 10th  Report to [facility] Entrance A at 6:30 A.M.  Call this number if you have problems the morning of surgery:  [phone]   Remember:  Do not eat after midnight.  You may drink clear liquids until 5:30 A.M.  Clear liquids allowed are:    Water, Juice (non-citric and without pulp), Carbonated beverages, Clear Tea, Black Coffee only, Plain Jell-O only, Gatorade and Plain Popsicles only   Please complete your PRE-SURGERY ENSURE that was provided to you by 5:30 A.M.  Please, if able, drink it in one setting. DO NOT SIP.   Take these medicines the morning of surgery with A SIP OF WATER  atorvastatin (LIPITOR) buPROPion (WELLBUTRIN XL) dexlansoprazole (DEXILANT) fluticasone (FLONASE) fluticasone-salmeterol (ADVAIR HFA) metoprolol succinate (TOPROL-XL) pregabalin (LYRICA)  If needed - acetaminophen (TYLENOL), albuterol (PROVENTIL HFA;VENTOLIN HFA), EPINEPHrine 0.3 mg/0.3 mL IJ SOAJ injectionnitroGLYCERIN (NITROSTAT)   Follow your surgeon's instructions on when to stop Aspirin.  If no instructions were given by your surgeon then you will need to call the office to get those instructions.    7 days prior to surgery STOP taking any Aspirin (unless otherwise instructed by your surgeon), Aleve, Naproxen, Ibuprofen, Motrin, Advil, Goody's, BC's, all herbal medications, fish oil, and all vitamins.    Do not wear jewelry, make-up or nail polish.  Do not wear lotions, powders, or perfumes, or deodorant.  Do not shave 48 hours prior to surgery.    Do not bring valuables to the hospital.  [facility]  is not responsible for any belongings or valuables.  Contacts, dentures or bridgework may not be worn into surgery.  Leave your suitcase in the car.  After surgery it may be brought to your room.  For patients admitted to the hospital, discharge time will be determined by your treatment team.  Patients discharged the day of surgery will not be allowed to drive home.   Name and phone number of your driver:  Special instructions:    [place]- Preparing For Surgery  Before surgery, you can play an important role. Because skin is not sterile, your skin needs to be as free of germs as possible. You can reduce the number of germs on your skin by washing with CHG (chlorahexidine gluconate) Soap before surgery.  CHG is an antiseptic cleaner which kills germs and bonds with the skin to continue killing germs even after washing.    Oral Hygiene is also important to reduce your risk of infection.  Remember - BRUSH YOUR TEETH THE MORNING OF SURGERY WITH YOUR REGULAR TOOTHPASTE  Please do not use if you have an allergy to CHG or antibacterial soaps. If your skin becomes reddened/irritated stop using the CHG.  Do not shave (including legs and underarms) for at least 48 hours prior to first CHG shower. It is OK to shave your face.  Please follow these instructions carefully.   1. Shower the NIGHT BEFORE SURGERY and the MORNING OF SURGERY with CHG.   2. If you chose to wash your hair, wash your hair first as usual with your normal shampoo.  3. After you shampoo, rinse  your hair and body thoroughly to remove the shampoo.  4. Use CHG as you would any other liquid soap. You can apply CHG directly to the skin and wash gently with a scrungie or a clean washcloth.   5. Apply the CHG Soap to your body ONLY FROM THE NECK DOWN.  Do not use on open wounds or open sores. Avoid contact with your eyes, ears, mouth and genitals (private parts). Wash Face and genitals (private parts)  with your normal  soap.  6. Wash thoroughly, paying special attention to the area where your surgery will be performed.  7. Thoroughly rinse your body with warm water from the neck down.  8. DO NOT shower/wash with your normal soap after using and rinsing off the CHG Soap.  9. Pat yourself dry with a CLEAN TOWEL.  10. Wear CLEAN PAJAMAS to bed the night before surgery, wear comfortable clothes the morning of surgery  11. Place CLEAN SHEETS on your bed the night of your first shower and DO NOT SLEEP WITH PETS.  Day of Surgery:  Do not apply any deodorants/lotions.  Please wear clean clothes to the hospital/surgery center.   Remember to brush your teeth WITH YOUR REGULAR TOOTHPASTE.  Please read over the following fact sheets that you were given. Pain Booklet and Coughing and Deep Breathing

## 2019-04-08 ENCOUNTER — Encounter (HOSPITAL_COMMUNITY): Payer: Self-pay

## 2019-04-08 ENCOUNTER — Other Ambulatory Visit (HOSPITAL_COMMUNITY)
Admission: RE | Admit: 2019-04-08 | Discharge: 2019-04-08 | Disposition: A | Discharge status: Home / Self Care | Payer: Medicare Other | Source: Ambulatory Visit | Attending: General Surgery | Admitting: General Surgery

## 2019-04-08 ENCOUNTER — Other Ambulatory Visit: Payer: Self-pay

## 2019-04-08 ENCOUNTER — Encounter (HOSPITAL_COMMUNITY): Payer: Self-pay | Admitting: Anesthesiology

## 2019-04-08 ENCOUNTER — Encounter (HOSPITAL_COMMUNITY)
Admission: RE | Admit: 2019-04-08 | Discharge: 2019-04-08 | Disposition: A | Discharge status: Home / Self Care | Payer: Medicare Other | Source: Ambulatory Visit | Attending: General Surgery | Admitting: General Surgery

## 2019-04-08 HISTORY — DX: Unspecified asthma, uncomplicated: J45.909

## 2019-04-08 HISTORY — DX: Sleep apnea, unspecified: G47.30

## 2019-04-08 LAB — CBC
HCT: 41.4 % (ref 36.0–46.0)
Hemoglobin: 13.5 g/dL (ref 12.0–15.0)
MCH: 31.6 pg (ref 26.0–34.0)
MCHC: 32.6 g/dL (ref 30.0–36.0)
MCV: 97 fL (ref 80.0–100.0)
Platelets: 349 10*3/uL (ref 150–400)
RBC: 4.27 MIL/uL (ref 3.87–5.11)
RDW: 13.3 % (ref 11.5–15.5)
WBC: 9.7 10*3/uL (ref 4.0–10.5)
nRBC: 0 % (ref 0.0–0.2)

## 2019-04-08 LAB — BASIC METABOLIC PANEL
Anion gap: 8 (ref 5–15)
BUN: 15 mg/dL (ref 8–23)
CO2: 26 mmol/L (ref 22–32)
Calcium: 9.4 mg/dL (ref 8.9–10.3)
Chloride: 107 mmol/L (ref 98–111)
Creatinine, Ser: 0.92 mg/dL (ref 0.44–1.00)
GFR calc Af Amer: >60 mL/min (ref >60)
GFR calc non Af Amer: >60 mL/min (ref >60)
Glucose, Bld: 72 mg/dL (ref 70–99)
Potassium: 4.7 mmol/L (ref 3.5–5.1)
Sodium: 141 mmol/L (ref 135–145)

## 2019-04-08 LAB — SARS CORONAVIRUS 2 BY RT PCR (HOSPITAL ORDER, PERFORMED IN ~~LOC~~ HOSPITAL LAB): SARS Coronavirus 2: NEGATIVE

## 2019-04-08 NOTE — Anesthesia Preprocedure Evaluation (Addendum)
Anesthesia Evaluation  Patient identified by MRN, date of birth, ID band Patient awake    Reviewed: Allergy & Precautions, NPO status , Patient's Chart, lab work & pertinent test results  History of Anesthesia Complications (+) PONV and history of anesthetic complications  Airway Mallampati: III       Dental no notable dental hx. (+) Teeth Intact   Pulmonary    Pulmonary exam normal breath sounds clear to auscultation       Cardiovascular Normal cardiovascular exam Rhythm:Regular Rate:Normal     Neuro/Psych negative psych ROS   GI/Hepatic   Endo/Other  Hypothyroidism   Renal/GU   negative genitourinary   Musculoskeletal   Abdominal Normal abdominal exam  (+)   Peds  Hematology   Anesthesia Other Findings   Reproductive/Obstetrics                           Anesthesia Physical Anesthesia Plan  ASA: II  Anesthesia Plan: General   Post-op Pain Management:    Induction: Intravenous  PONV Risk Score and Plan: 4 or greater and Ondansetron and Dexamethasone  Airway Management Planned: Oral ETT  Additional Equipment:   Intra-op Plan:   Post-operative Plan: Extubation in OR  Informed Consent: I have reviewed the patients History and Physical, chart, labs and discussed the procedure including the risks, benefits and alternatives for the proposed anesthesia with the patient or authorized representative who has indicated his/her understanding and acceptance.     Dental advisory given  Plan Discussed with:   Anesthesia Plan Comments: (PAT note written 04/08/2019 by Myra Gianotti, PA-C. )       Anesthesia Quick Evaluation                                  Anesthesia Evaluation  Patient identified by MRN, date of birth, ID band Patient awake    Reviewed: Allergy & Precautions, NPO status , Patient's Chart, lab work & pertinent test results, reviewed documented beta blocker  date and time   History of Anesthesia Complications (+) PONV  Airway Mallampati: III  TM Distance: >3 FB Neck ROM: Limited    Dental no notable dental hx.    Pulmonary asthma ,    Pulmonary exam normal breath sounds clear to auscultation       Cardiovascular Normal cardiovascular exam+ dysrhythmias Supra Ventricular Tachycardia  Rhythm:Regular Rate:Normal  Paroxysmal atrial tachycardia  Sees cardiologist Marlou Porch)   Neuro/Psych PSYCHIATRIC DISORDERS Anxiety negative neurological ROS     GI/Hepatic Neg liver ROS, GERD  Medicated and Controlled,Esophageal spasm   Endo/Other  Hypothyroidism   Renal/GU negative Renal ROS     Musculoskeletal Back pain RLS (restless legs syndrome)   Abdominal   Peds  Hematology HLD   Anesthesia Other Findings COLOSTOMY  Reproductive/Obstetrics                            Anesthesia Physical Anesthesia Plan  ASA: III  Anesthesia Plan: General   Post-op Pain Management:    Induction: Intravenous  PONV Risk Score and Plan: 4 or greater and Ondansetron, Dexamethasone, Midazolam, Treatment may vary due to age or medical condition and Scopolamine patch - Pre-op  Airway Management Planned: Video Laryngoscope Planned and Oral ETT  Additional Equipment:   Intra-op Plan:   Post-operative Plan: Extubation in OR  Informed Consent: I have reviewed the  patients History and Physical, chart, labs and discussed the procedure including the risks, benefits and alternatives for the proposed anesthesia with the patient or authorized representative who has indicated his/her understanding and acceptance.   Dental advisory given  Plan Discussed with: CRNA  Anesthesia Plan Comments:         Anesthesia Quick Evaluation                                   Anesthesia Evaluation  Patient identified by MRN, date of birth, ID band Patient awake    Reviewed: Allergy & Precautions, NPO status , Patient's  Chart, lab work & pertinent test results  History of Anesthesia Complications Negative for: history of anesthetic complications  Airway Mallampati: II  TM Distance: >3 FB Neck ROM: Full    Dental  (+) Teeth Intact   Pulmonary asthma ,    breath sounds clear to auscultation       Cardiovascular + Peripheral Vascular Disease   Rhythm:Regular     Neuro/Psych  Neuromuscular disease negative psych ROS   GI/Hepatic Neg liver ROS, Sigmoid volvulus    Endo/Other  Hypothyroidism   Renal/GU negative Renal ROS  negative genitourinary   Musculoskeletal  (+) Fibromyalgia -  Abdominal   Peds negative pediatric ROS (+)  Hematology  (+) anemia ,   Anesthesia Other Findings   Reproductive/Obstetrics                             Anesthesia Physical Anesthesia Plan  ASA: II  Anesthesia Plan: General   Post-op Pain Management:    Induction: Intravenous, Rapid sequence and Cricoid pressure planned  PONV Risk Score and Plan: 3 and Ondansetron and Dexamethasone  Airway Management Planned: Oral ETT  Additional Equipment: None  Intra-op Plan:   Post-operative Plan: Extubation in OR  Informed Consent: I have reviewed the patients History and Physical, chart, labs and discussed the procedure including the risks, benefits and alternatives for the proposed anesthesia with the patient or authorized representative who has indicated his/her understanding and acceptance.   Dental advisory given  Plan Discussed with: CRNA and Surgeon  Anesthesia Plan Comments:         Anesthesia Quick Evaluation

## 2019-04-08 NOTE — Progress Notes (Signed)
Anesthesia Chart Review:  Case:  [id number] Date/Time:  04/09/19 0815   Procedures:      OPEN LYSIS OF ADHESION (N/A )     INCISIONAL HERNIA REPAIR WITH MESH (N/A )   Anesthesia type:  General   Pre-op diagnosis:  INCISIONAL HERNIA   Location:  [facility] ROOM 02 / [facility]   Surgeon:  Ralene Ok, MD      DISCUSSION: Patient is a 63 year old female scheduled for the above procedure.  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  She was seen by cardiologist Dr. Marlou Porch April 2020. He was aware of surgery plans. PAF controlled with metoprolol. She denied history of cardiac cath. She tolerated general surgery within the past six months. If no acute changes then I would anticipate that she can proceed as planned. 04/08/19 presurgical COVID test negative.   VS: BP (!) 123/56   Pulse 74   Temp (!) 36.3 C   Resp 20   Ht 5\' 5"  (1.651 m)   Wt 73.3 kg   SpO2 100%   BMI 26.88 kg/m   PROVIDERS: Kathyrn Lass, MD is PCP - Candee Furbish, MD is cardiologist. He has seen her prior to 2014 for palpitations and atypical chest pain with testing in 2011. Last evaluation 02/19/19.  Baxter Kail, MD is dermatologist ([place]). Has also seen Eather Colas, MD. - Hardie Shackleton, MD is rheumatolgoist ([place]) - OSA is managed by Nehemiah Settle, MD.    LABS: Labs reviewed: Acceptable for surgery. (all labs ordered are listed, but only abnormal results are displayed)  Labs Reviewed  SARS CORONAVIRUS 2 ([facility])  BASIC METABOLIC PANEL  CBC    IMAGES: CT abd/pelvis 01/09/19: IMPRESSION: 1. Marked diastasis recti along the entire anterior abdominal wall. Along the inferior aspect of the anterior abdominal wall the fascia abuts the skin surface suggesting changes of prior healing by secondary intention of an open surgical wound. Multiple loops of small bowel are closely adherent to the undersurface of the fascia at this location consistent with underlying adhesions. There is one focal area in the left lower quadrant where the fascia may separate from the medial aspect of the left rectus abdominus muscle allowing a knuckle of small bowel to protrude slightly. This is located at approximately the level of the iliac crest. No evidence of obstruction or inflammatory change. 2. Surgical changes of prior cholecystectomy, hysterectomy and sigmoid colectomy without complicating feature. 3. Lower lumbar facet arthropathy. 4. Mild grade 1 anterolisthesis of L4 on L5.  1V CXR (as part of DG abd acute w/chest): IMPRESSION: Right base atelectasis.  Lungs elsewhere clear.   EKG: 10/14/18: NSR   CV: Renal US 06/05/18: IMPRESSION: Normal renal ultrasound.  Carotid US 03/26/14: Summary: Bilateral: mild soft plaque origin ICA. 1-39% ICA stenosis. Vertebral artery flow is antegrade. ICA/CCA ratio: R-0.80 L-0.66.  Echo 07/11/10 [facility], now [facility]; scanned under Media tab, Correspondence 03/01/11): Conclusions: 1.  Normal LV size and function. 2.  There were no gross regional wall motion abnormalities. 3.  Left ventricular ejection fraction estimated by 2D at 55 to 60%. 4.  Mildly thickened mitral  valve. No mitral valve regurgitation. 5.  Trivial tricuspid regurgitation.  Normal estimated right ventricular systolic pressure. 6.  Analysis of mitral valve inflow, pulmonary vein Doppler and tissue Doppler suggests grade 1 diastolic dysfunction without elevated  left atrial pressure.  According to 03/04/14 note by Dr. Marlou Porch, she "underwent echocardiogram and nuclear stress test on 07/11/10 both reassuring showing normal EF, no ischemia, mild diastolic dysfunction."   She denied prior cardiac cath.   Past Medical History:  Diagnosis Date  . Asthma   . Back pain   . Chronic fatigue syndrome    Dr. Sabra Heck  . Complication of anesthesia   . Cough    /SOB, PFT's nl, improved with Bronchodilation 8/11  . Dysrhythmia   . Esophageal spasm    GERD Dr. Sabra Heck, Dr. Wynetta Emery; egd 2006 nl; UGI 3/13 [facility], moderate GERD, nl motility; seen at [facility] ([place]), egd? neg, sone improvement on sucralfate susp  . Fibromyalgia   . Foot pain    Dr. Rushie Nyhan  . High triglycerides   . Hypothyroidism    Dr. Lewis Shock 4/14  . Leukocytoclastic vasculitis ([place])    Dr. Tonia Brooms  . Low HDL (under 40)   . Mitral valve prolapse   . Paroxysmal atrial tachycardia (HCC)    Dr. Marlou Porch  . Pernicious anemia    Dr. Sabra Heck  . PONV (postoperative nausea and vomiting)    along time ago had nausea ans vomiting after surgery-25 years ago  . Reflux    ? delayed gastric emptying--GES nl 01/2012 (6% at 2hrs)  . RLS (restless legs syndrome)    low ferritin Dr. Sabra Heck  . Sleep apnea   . Uterine carcinoma (HCC)    Dr. Polly Cobia    Past Surgical History:  Procedure Laterality Date  . ABDOMINAL HYSTERECTOMY    . bladder tack     x 2  . BUNIONECTOMY    . CHOLECYSTECTOMY    . COLONOSCOPY     scr, 12/2008 ([facility]) nl  . COLOSTOMY Left 06/05/2018   Procedure: COLOSTOMY;  Surgeon: Ralene Ok, MD;  Location: [facility];  Service: General;  Laterality: Left;  . COLOSTOMY REVERSAL N/A 10/16/2018   Procedure: HARTMANN'S COLOSTOMY REVERSAL, RIGID PROCTOSCOPY;  Surgeon: Ralene Ok, MD;  Location: [facility];  Service: General;  Laterality: N/A;  . HAMMER TOE SURGERY    . LAPAROSCOPY N/A 10/16/2018   Procedure: LAPAROSCOPY DIAGNOSTIC WITH LYSIS OF ADHESIONS;  Surgeon: Ralene Ok, MD;   Location: [facility];  Service: General;  Laterality: N/A;  . LUNG BIOPSY     L Lower lung pulm nodules, largest 4.69mm, low risk, repeat CT in 1 yr now following with pulm [facility]; 9/13 Stable tiny lung nodules, no f/u needed  . METATARSAL OSTEOTOMY    . NECK SURGERY     x 2  . PARTIAL COLECTOMY N/A 06/05/2018   Procedure: EXPLORATORY LAPAROTOMY AND PARTIAL COLECTOMY;  Surgeon: Ralene Ok, MD;  Location: [facility];  Service: General;  Laterality: N/A;  . TONSILLECTOMY    . US ECHOCARDIOGRAPHY     with Nuclear test, Dr. Marlou Porch, Low risk  . VESICOVAGINAL FISTULA CLOSURE W/ TAH      MEDICATIONS: . acetaminophen (TYLENOL) 500 MG tablet  . albuterol (PROVENTIL HFA;VENTOLIN HFA) 108 (90 Base) MCG/ACT inhaler  . aspirin EC 81 MG tablet  . atorvastatin (LIPITOR) 10 MG tablet  . Azelaic Acid (FINACEA EX)  . buPROPion (WELLBUTRIN XL) 150 MG 24 hr tablet  . Cholecalciferol (VITAMIN D3) 25 MCG (1000 UT)  CAPS  . clindamycin (CLINDAGEL) 1 % gel  . dexlansoprazole (DEXILANT) 60 MG capsule  . diclofenac sodium (VOLTAREN) 1 % GEL  . doxycycline (PERIOSTAT) 20 MG tablet  . DULoxetine (CYMBALTA) 30 MG capsule  . EPINEPHrine 0.3 mg/0.3 mL IJ SOAJ injection  . ferrous sulfate 325 (65 FE) MG tablet  . fluticasone (FLONASE) 50 MCG/ACT nasal spray  . fluticasone-salmeterol (ADVAIR HFA) 115-21 MCG/ACT inhaler  . lactobacillus acidophilus (BACID) TABS tablet  . levothyroxine (SYNTHROID) 125 MCG tablet  . metoprolol succinate (TOPROL-XL) 25 MG 24 hr tablet  . metronidazole (NORITATE) 1 % cream  . naproxen sodium (ALEVE) 220 MG tablet  . nitroGLYCERIN (NITROSTAT) 0.4 MG SL tablet  . pregabalin (LYRICA) 75 MG capsule  . Probiotic Product (PROBIOTIC PO)  . vitamin B-12 (CYANOCOBALAMIN) 1000 MCG tablet   No current facility-administered medications for this encounter.   Last ASA 04/03/19.  Myra Gianotti, PA-C Surgical Short Stay/Anesthesiology [facility] Phone [phone] [facility] Phone [phone] 04/08/2019  2:14 PM

## 2019-04-08 NOTE — Progress Notes (Addendum)
PCP -  Dr. Kathyrn Lass Cardiologist - Dr. Candee Furbish  Chest x-ray - denies EKG - 10/14/18 Stress Test - per pt. 8+ years ago at Dr. Marlou Porch" ECHO - per pt. "10+ years ago, at Dr. Marlou Porch" Cardiac Cath - denies  Sleep Study - 11/2018 - records requested CPAP - Yes  Blood Thinner Instructions: N/A Aspirin Instructions: 04/03/2019  Anesthesia review: Yes, cardiac hx, recent OSA dx  Coronavirus Screening  Have you experienced the following symptoms:  Cough yes/no: No Fever (>100.60F)  yes/no: No Runny nose yes/no: No Sore throat yes/no: No Difficulty breathing/shortness of breath  yes/no: No  Have you or a family member traveled in the last 14 days and where? yes/no: No  If the patient indicates "YES" to the above questions, their PAT will be rescheduled to limit the exposure to others and, the surgeon will be notified. THE PATIENT WILL NEED TO BE ASYMPTOMATIC FOR 14 DAYS.   If the patient is not experiencing any of these symptoms, the PAT nurse will instruct them to NOT bring anyone with them to their appointment since they may have these symptoms or traveled as well.   Please remind your patients and families that hospital visitation restrictions are in effect and the importance of the restrictions.   Patient denies shortness of breath, fever, cough and chest pain at PAT appointment  Patient verbalized understanding of instructions that were given to them at the PAT appointment. Patient was also instructed that they will need to review over the PAT instructions again at home before surgery.

## 2019-04-09 ENCOUNTER — Ambulatory Visit (HOSPITAL_COMMUNITY): Payer: Medicare Other | Admitting: Physician Assistant

## 2019-04-09 ENCOUNTER — Other Ambulatory Visit: Payer: Self-pay

## 2019-04-09 ENCOUNTER — Inpatient Hospital Stay (HOSPITAL_COMMUNITY)
Admission: RE | Admit: 2019-04-09 | Discharge: 2019-04-11 | DRG: 337 | Disposition: A | Discharge status: Home / Self Care | Payer: Medicare Other | Attending: General Surgery | Admitting: General Surgery

## 2019-04-09 ENCOUNTER — Encounter (HOSPITAL_COMMUNITY): Payer: Self-pay | Admitting: General Practice

## 2019-04-09 ENCOUNTER — Ambulatory Visit (HOSPITAL_COMMUNITY): Payer: Medicare Other | Admitting: Anesthesiology

## 2019-04-09 ENCOUNTER — Encounter (HOSPITAL_COMMUNITY)
Admission: RE | Disposition: A | Discharge status: Home / Self Care | Payer: Self-pay | Source: Home / Self Care | Attending: General Surgery

## 2019-04-09 DIAGNOSIS — E781 Pure hyperglyceridemia: Secondary | ICD-10-CM | POA: Diagnosis not present

## 2019-04-09 DIAGNOSIS — Z881 Allergy status to other antibiotic agents status: Secondary | ICD-10-CM

## 2019-04-09 DIAGNOSIS — Z91048 Other nonmedicinal substance allergy status: Secondary | ICD-10-CM

## 2019-04-09 DIAGNOSIS — K219 Gastro-esophageal reflux disease without esophagitis: Secondary | ICD-10-CM | POA: Diagnosis not present

## 2019-04-09 DIAGNOSIS — Z23 Encounter for immunization: Secondary | ICD-10-CM

## 2019-04-09 DIAGNOSIS — J45909 Unspecified asthma, uncomplicated: Secondary | ICD-10-CM | POA: Diagnosis present

## 2019-04-09 DIAGNOSIS — I341 Nonrheumatic mitral (valve) prolapse: Secondary | ICD-10-CM | POA: Diagnosis not present

## 2019-04-09 DIAGNOSIS — Z91040 Latex allergy status: Secondary | ICD-10-CM

## 2019-04-09 DIAGNOSIS — Z8719 Personal history of other diseases of the digestive system: Secondary | ICD-10-CM

## 2019-04-09 DIAGNOSIS — K432 Incisional hernia without obstruction or gangrene: Secondary | ICD-10-CM | POA: Diagnosis not present

## 2019-04-09 DIAGNOSIS — Z9889 Other specified postprocedural states: Secondary | ICD-10-CM

## 2019-04-09 DIAGNOSIS — E039 Hypothyroidism, unspecified: Secondary | ICD-10-CM | POA: Diagnosis not present

## 2019-04-09 DIAGNOSIS — M797 Fibromyalgia: Secondary | ICD-10-CM | POA: Diagnosis present

## 2019-04-09 DIAGNOSIS — G4730 Sleep apnea, unspecified: Secondary | ICD-10-CM | POA: Diagnosis present

## 2019-04-09 DIAGNOSIS — I480 Paroxysmal atrial fibrillation: Secondary | ICD-10-CM | POA: Diagnosis present

## 2019-04-09 DIAGNOSIS — Z79890 Hormone replacement therapy (postmenopausal): Secondary | ICD-10-CM

## 2019-04-09 DIAGNOSIS — Z1159 Encounter for screening for other viral diseases: Secondary | ICD-10-CM | POA: Diagnosis not present

## 2019-04-09 DIAGNOSIS — Z79899 Other long term (current) drug therapy: Secondary | ICD-10-CM

## 2019-04-09 DIAGNOSIS — Z8542 Personal history of malignant neoplasm of other parts of uterus: Secondary | ICD-10-CM

## 2019-04-09 DIAGNOSIS — K660 Peritoneal adhesions (postprocedural) (postinfection): Secondary | ICD-10-CM | POA: Diagnosis not present

## 2019-04-09 DIAGNOSIS — Z791 Long term (current) use of non-steroidal anti-inflammatories (NSAID): Secondary | ICD-10-CM

## 2019-04-09 DIAGNOSIS — F419 Anxiety disorder, unspecified: Secondary | ICD-10-CM | POA: Diagnosis present

## 2019-04-09 DIAGNOSIS — Z90710 Acquired absence of both cervix and uterus: Secondary | ICD-10-CM

## 2019-04-09 DIAGNOSIS — Z888 Allergy status to other drugs, medicaments and biological substances status: Secondary | ICD-10-CM

## 2019-04-09 DIAGNOSIS — G2581 Restless legs syndrome: Secondary | ICD-10-CM | POA: Diagnosis not present

## 2019-04-09 DIAGNOSIS — Z9049 Acquired absence of other specified parts of digestive tract: Secondary | ICD-10-CM

## 2019-04-09 HISTORY — PX: INCISIONAL HERNIA REPAIR: SHX193

## 2019-04-09 HISTORY — PX: LYSIS OF ADHESION: SHX5961

## 2019-04-09 SURGERY — LAPAROTOMY, FOR LYSIS OF ADHESIONS
Anesthesia: General

## 2019-04-09 MED ORDER — KETOROLAC TROMETHAMINE 30 MG/ML IJ SOLN
30.0000 mg | Freq: Four times a day (QID) | INTRAMUSCULAR | Status: DC | PRN
  Administered 2019-04-09 – 2019-04-10 (×4): 30 mg via INTRAVENOUS
  Filled 2019-04-09 (×5): qty 1

## 2019-04-09 MED ORDER — ONDANSETRON HCL 4 MG/2ML IJ SOLN
4.0000 mg | Freq: Four times a day (QID) | INTRAMUSCULAR | Status: DC | PRN
  Administered 2019-04-09: 4 mg via INTRAVENOUS
  Filled 2019-04-09 (×2): qty 2

## 2019-04-09 MED ORDER — CELECOXIB 200 MG PO CAPS
200.0000 mg | ORAL_CAPSULE | ORAL | Status: AC
  Administered 2019-04-09: 200 mg via ORAL

## 2019-04-09 MED ORDER — 0.9 % SODIUM CHLORIDE (POUR BTL) OPTIME
TOPICAL | Status: DC | PRN
  Administered 2019-04-09: 1000 mL

## 2019-04-09 MED ORDER — LEVOTHYROXINE SODIUM 25 MCG PO TABS
125.0000 ug | ORAL_TABLET | Freq: Every day | ORAL | Status: DC
  Administered 2019-04-09 – 2019-04-10 (×2): 125 ug via ORAL
  Filled 2019-04-09 (×2): qty 1

## 2019-04-09 MED ORDER — ENSURE PRE-SURGERY PO LIQD: 592.0000 mL | Freq: Once | ORAL | Status: DC

## 2019-04-09 MED ORDER — SODIUM CHLORIDE 0.9 % IV SOLN
INTRAVENOUS | Status: DC | PRN
  Administered 2019-04-09: 40 mL

## 2019-04-09 MED ORDER — CHLORHEXIDINE GLUCONATE CLOTH 2 % EX PADS: 6.0000 | MEDICATED_PAD | Freq: Once | CUTANEOUS | Status: DC

## 2019-04-09 MED ORDER — EPHEDRINE SULFATE-NACL 50-0.9 MG/10ML-% IV SOSY
PREFILLED_SYRINGE | INTRAVENOUS | Status: DC | PRN
  Administered 2019-04-09: 15 mg via INTRAVENOUS
  Administered 2019-04-09: 10 mg via INTRAVENOUS
  Administered 2019-04-09: 5 mg via INTRAVENOUS
  Administered 2019-04-09: 10 mg via INTRAVENOUS

## 2019-04-09 MED ORDER — CEFAZOLIN SODIUM-DEXTROSE 2-4 GM/100ML-% IV SOLN
INTRAVENOUS | Status: AC
  Filled 2019-04-09: qty 100

## 2019-04-09 MED ORDER — DEXTROSE-NACL 5-0.9 % IV SOLN
INTRAVENOUS | Status: DC
  Administered 2019-04-09 (×2): via INTRAVENOUS

## 2019-04-09 MED ORDER — PROPOFOL 10 MG/ML IV BOLUS
INTRAVENOUS | Status: AC
  Filled 2019-04-09: qty 20

## 2019-04-09 MED ORDER — CELECOXIB 200 MG PO CAPS
ORAL_CAPSULE | ORAL | Status: AC
  Filled 2019-04-09: qty 1

## 2019-04-09 MED ORDER — GLYCOPYRROLATE 0.2 MG/ML IJ SOLN
INTRAMUSCULAR | Status: DC | PRN
  Administered 2019-04-09: 0.2 mg via INTRAVENOUS

## 2019-04-09 MED ORDER — SCOPOLAMINE 1 MG/3DAYS TD PT72
MEDICATED_PATCH | TRANSDERMAL | Status: DC | PRN
  Administered 2019-04-09: 1 via TRANSDERMAL

## 2019-04-09 MED ORDER — PNEUMOCOCCAL VAC POLYVALENT 25 MCG/0.5ML IJ INJ
0.5000 mL | INJECTION | INTRAMUSCULAR | Status: AC
  Administered 2019-04-11: 0.5 mL via INTRAMUSCULAR
  Filled 2019-04-09: qty 0.5

## 2019-04-09 MED ORDER — TRAMADOL HCL 50 MG PO TABS
50.0000 mg | ORAL_TABLET | Freq: Four times a day (QID) | ORAL | Status: DC | PRN
  Administered 2019-04-09 – 2019-04-10 (×4): 50 mg via ORAL
  Filled 2019-04-09 (×5): qty 1

## 2019-04-09 MED ORDER — ACETAMINOPHEN 10 MG/ML IV SOLN: 1000.0000 mg | Freq: Once | INTRAVENOUS | Status: DC | PRN

## 2019-04-09 MED ORDER — PROPOFOL 10 MG/ML IV BOLUS
INTRAVENOUS | Status: DC | PRN
  Administered 2019-04-09: 200 mg via INTRAVENOUS

## 2019-04-09 MED ORDER — ONDANSETRON HCL 4 MG/2ML IJ SOLN
INTRAMUSCULAR | Status: DC | PRN
  Administered 2019-04-09: 4 mg via INTRAVENOUS

## 2019-04-09 MED ORDER — ACETAMINOPHEN 500 MG PO TABS
ORAL_TABLET | ORAL | Status: AC
  Filled 2019-04-09: qty 2

## 2019-04-09 MED ORDER — FENTANYL CITRATE (PF) 250 MCG/5ML IJ SOLN
INTRAMUSCULAR | Status: AC
  Filled 2019-04-09: qty 5

## 2019-04-09 MED ORDER — PANTOPRAZOLE SODIUM 40 MG PO TBEC
40.0000 mg | DELAYED_RELEASE_TABLET | Freq: Every day | ORAL | Status: DC
  Administered 2019-04-10 – 2019-04-11 (×2): 40 mg via ORAL
  Filled 2019-04-09 (×2): qty 1

## 2019-04-09 MED ORDER — LIDOCAINE 2% (20 MG/ML) 5 ML SYRINGE
INTRAMUSCULAR | Status: DC | PRN
  Administered 2019-04-09: 100 mg via INTRAVENOUS

## 2019-04-09 MED ORDER — DULOXETINE HCL 60 MG PO CPEP
90.0000 mg | ORAL_CAPSULE | Freq: Every day | ORAL | Status: DC
  Administered 2019-04-09 – 2019-04-10 (×2): 90 mg via ORAL
  Filled 2019-04-09 (×2): qty 1

## 2019-04-09 MED ORDER — ASPIRIN EC 81 MG PO TBEC
81.0000 mg | DELAYED_RELEASE_TABLET | Freq: Every day | ORAL | Status: DC
  Administered 2019-04-10 – 2019-04-11 (×2): 81 mg via ORAL
  Filled 2019-04-09 (×2): qty 1

## 2019-04-09 MED ORDER — HYDROMORPHONE HCL 1 MG/ML IJ SOLN
INTRAMUSCULAR | Status: AC
  Filled 2019-04-09: qty 1

## 2019-04-09 MED ORDER — ALBUTEROL SULFATE HFA 108 (90 BASE) MCG/ACT IN AERS: 2.0000 | INHALATION_SPRAY | RESPIRATORY_TRACT | Status: DC | PRN

## 2019-04-09 MED ORDER — ENSURE PRE-SURGERY PO LIQD
296.0000 mL | Freq: Once | ORAL | Status: DC
  Filled 2019-04-09: qty 296

## 2019-04-09 MED ORDER — CELECOXIB 200 MG PO CAPS
200.0000 mg | ORAL_CAPSULE | Freq: Two times a day (BID) | ORAL | Status: DC
  Administered 2019-04-09 – 2019-04-11 (×5): 200 mg via ORAL
  Filled 2019-04-09 (×5): qty 1

## 2019-04-09 MED ORDER — BUPROPION HCL ER (XL) 150 MG PO TB24
450.0000 mg | ORAL_TABLET | Freq: Every day | ORAL | Status: DC
  Administered 2019-04-10 – 2019-04-11 (×2): 450 mg via ORAL
  Filled 2019-04-09 (×2): qty 3

## 2019-04-09 MED ORDER — MIDAZOLAM HCL 2 MG/2ML IJ SOLN
INTRAMUSCULAR | Status: DC | PRN
  Administered 2019-04-09: 2 mg via INTRAVENOUS

## 2019-04-09 MED ORDER — ONDANSETRON 4 MG PO TBDP
4.0000 mg | ORAL_TABLET | Freq: Four times a day (QID) | ORAL | Status: DC | PRN
  Administered 2019-04-10: 4 mg via ORAL
  Filled 2019-04-09: qty 1

## 2019-04-09 MED ORDER — PHENYLEPHRINE 40 MCG/ML (10ML) SYRINGE FOR IV PUSH (FOR BLOOD PRESSURE SUPPORT)
PREFILLED_SYRINGE | INTRAVENOUS | Status: DC | PRN
  Administered 2019-04-09 (×2): 200 ug via INTRAVENOUS

## 2019-04-09 MED ORDER — ACETAMINOPHEN 500 MG PO TABS
1000.0000 mg | ORAL_TABLET | ORAL | Status: AC
  Administered 2019-04-09: 1000 mg via ORAL

## 2019-04-09 MED ORDER — MIDAZOLAM HCL 2 MG/2ML IJ SOLN
INTRAMUSCULAR | Status: AC
  Filled 2019-04-09: qty 2

## 2019-04-09 MED ORDER — HYDROMORPHONE HCL 1 MG/ML IJ SOLN
0.2500 mg | INTRAMUSCULAR | Status: DC | PRN
  Administered 2019-04-09 (×2): 0.25 mg via INTRAVENOUS

## 2019-04-09 MED ORDER — PREGABALIN 75 MG PO CAPS
75.0000 mg | ORAL_CAPSULE | Freq: Two times a day (BID) | ORAL | Status: DC
  Administered 2019-04-09 – 2019-04-11 (×4): 75 mg via ORAL
  Filled 2019-04-09 (×4): qty 1

## 2019-04-09 MED ORDER — EVICEL 5 ML EX KIT
PACK | CUTANEOUS | Status: DC | PRN
  Administered 2019-04-09: 10 mL via TOPICAL

## 2019-04-09 MED ORDER — MEPERIDINE HCL 25 MG/ML IJ SOLN: 6.2500 mg | INTRAMUSCULAR | Status: DC | PRN

## 2019-04-09 MED ORDER — GABAPENTIN 300 MG PO CAPS: 300.0000 mg | ORAL_CAPSULE | ORAL | Status: DC

## 2019-04-09 MED ORDER — BUPIVACAINE LIPOSOME 1.3 % IJ SUSP
20.0000 mL | Freq: Once | INTRAMUSCULAR | Status: DC
  Filled 2019-04-09: qty 20

## 2019-04-09 MED ORDER — METOPROLOL SUCCINATE ER 25 MG PO TB24
25.0000 mg | ORAL_TABLET | Freq: Every day | ORAL | Status: DC
  Administered 2019-04-10 – 2019-04-11 (×2): 25 mg via ORAL
  Filled 2019-04-09 (×2): qty 1

## 2019-04-09 MED ORDER — LACTATED RINGERS IV SOLN
INTRAVENOUS | Status: DC | PRN
  Administered 2019-04-09 (×3): via INTRAVENOUS

## 2019-04-09 MED ORDER — FENTANYL CITRATE (PF) 100 MCG/2ML IJ SOLN
INTRAMUSCULAR | Status: DC | PRN
  Administered 2019-04-09 (×2): 100 ug via INTRAVENOUS
  Administered 2019-04-09: 50 ug via INTRAVENOUS

## 2019-04-09 MED ORDER — CEFAZOLIN SODIUM-DEXTROSE 2-4 GM/100ML-% IV SOLN
2.0000 g | INTRAVENOUS | Status: AC
  Administered 2019-04-09: 2 g via INTRAVENOUS

## 2019-04-09 MED ORDER — SODIUM CHLORIDE 0.9 % IV SOLN
INTRAVENOUS | Status: DC | PRN
  Administered 2019-04-09: 30 ug/min via INTRAVENOUS

## 2019-04-09 MED ORDER — EVICEL 5 ML EX KIT
PACK | CUTANEOUS | Status: AC
  Filled 2019-04-09: qty 2

## 2019-04-09 MED ORDER — PROMETHAZINE HCL 25 MG/ML IJ SOLN: 6.2500 mg | INTRAMUSCULAR | Status: DC | PRN

## 2019-04-09 MED ORDER — DEXAMETHASONE SODIUM PHOSPHATE 10 MG/ML IJ SOLN
INTRAMUSCULAR | Status: DC | PRN
  Administered 2019-04-09: 4 mg via INTRAVENOUS

## 2019-04-09 MED ORDER — SCOPOLAMINE 1 MG/3DAYS TD PT72
MEDICATED_PATCH | TRANSDERMAL | Status: AC
  Filled 2019-04-09: qty 1

## 2019-04-09 MED ORDER — ROCURONIUM BROMIDE 10 MG/ML (PF) SYRINGE
PREFILLED_SYRINGE | INTRAVENOUS | Status: DC | PRN
  Administered 2019-04-09: 60 mg via INTRAVENOUS
  Administered 2019-04-09 (×2): 20 mg via INTRAVENOUS

## 2019-04-09 MED ORDER — GABAPENTIN 300 MG PO CAPS
ORAL_CAPSULE | ORAL | Status: AC
  Filled 2019-04-09: qty 1

## 2019-04-09 MED ORDER — NITROGLYCERIN 0.4 MG SL SUBL: 0.4000 mg | SUBLINGUAL_TABLET | SUBLINGUAL | Status: DC | PRN

## 2019-04-09 SURGICAL SUPPLY — 44 items
BLADE CLIPPER SURG (BLADE) IMPLANT
BLADE SURG 11 STRL SS (BLADE) ×3 IMPLANT
COVER SURGICAL LIGHT HANDLE (MISCELLANEOUS) ×3 IMPLANT
COVER WAND RF STERILE (DRAPES) ×3 IMPLANT
DEVICE TROCAR PUNCTURE CLOSURE (ENDOMECHANICALS) ×3 IMPLANT
DRAIN CHANNEL 19F RND (DRAIN) IMPLANT
DRAPE INCISE IOBAN 66X45 STRL (DRAPES) IMPLANT
DRAPE LAPAROSCOPIC ABDOMINAL (DRAPES) ×3 IMPLANT
DRSG OPSITE POSTOP 4X10 (GAUZE/BANDAGES/DRESSINGS) ×2 IMPLANT
ELECT REM PT RETURN 9FT ADLT (ELECTROSURGICAL) ×3
ELECTRODE REM PT RTRN 9FT ADLT (ELECTROSURGICAL) ×1 IMPLANT
EVACUATOR SILICONE 100CC (DRAIN) IMPLANT
GAUZE SPONGE 4X4 12PLY STRL (GAUZE/BANDAGES/DRESSINGS) IMPLANT
GLOVE BIO SURGEON STRL SZ7.5 (GLOVE) ×3 IMPLANT
GLOVE BIOGEL PI IND STRL 8 (GLOVE) ×1 IMPLANT
GLOVE BIOGEL PI INDICATOR 8 (GLOVE) ×2
GOWN STRL REUS W/ TWL LRG LVL3 (GOWN DISPOSABLE) ×1 IMPLANT
GOWN STRL REUS W/ TWL XL LVL3 (GOWN DISPOSABLE) ×1 IMPLANT
GOWN STRL REUS W/TWL LRG LVL3 (GOWN DISPOSABLE) ×3
GOWN STRL REUS W/TWL XL LVL3 (GOWN DISPOSABLE) ×3
KIT BASIN OR (CUSTOM PROCEDURE TRAY) ×3 IMPLANT
KIT TURNOVER KIT B (KITS) ×3 IMPLANT
MESH PROLENE PML 12X12 (Mesh General) ×2 IMPLANT
NDL 18GX1X1/2 (RX/OR ONLY) (NEEDLE) ×1 IMPLANT
NEEDLE 18GX1X1/2 (RX/OR ONLY) (NEEDLE) ×3 IMPLANT
NS IRRIG 1000ML POUR BTL (IV SOLUTION) ×3 IMPLANT
PACK GENERAL/GYN (CUSTOM PROCEDURE TRAY) ×3 IMPLANT
PAD ARMBOARD 7.5X6 YLW CONV (MISCELLANEOUS) ×6 IMPLANT
PENCIL SMOKE EVACUATOR (MISCELLANEOUS) ×3 IMPLANT
STAPLER VISISTAT 35W (STAPLE) IMPLANT
SUT ETHILON 3 0 FSL (SUTURE) IMPLANT
SUT NOVA NAB GS-21 0 18 T12 DT (SUTURE) ×4 IMPLANT
SUT PDS AB 0 CT 36 (SUTURE) IMPLANT
SUT PDS AB 1 CT  36 (SUTURE) ×10
SUT PDS AB 1 CT 36 (SUTURE) IMPLANT
SUT VIC AB 2-0 SH 27 (SUTURE) ×3
SUT VIC AB 2-0 SH 27X BRD (SUTURE) IMPLANT
SUT VIC AB 3-0 SH 18 (SUTURE) ×2 IMPLANT
SUT VICRYL AB 2 0 TIES (SUTURE) ×3 IMPLANT
TOWEL OR 17X24 6PK STRL BLUE (TOWEL DISPOSABLE) ×3 IMPLANT
TOWEL OR 17X26 10 PK STRL BLUE (TOWEL DISPOSABLE) ×3 IMPLANT
TRAY FOL W/BAG SLVR 16FR STRL (SET/KITS/TRAYS/PACK) IMPLANT
TRAY FOLEY CATH SILVER 16FR (SET/KITS/TRAYS/PACK) IMPLANT
TRAY FOLEY W/BAG SLVR 16FR LF (SET/KITS/TRAYS/PACK) ×3

## 2019-04-09 NOTE — Plan of Care (Signed)

## 2019-04-09 NOTE — Op Note (Signed)
04/09/2019  11:07 AM  PATIENT:  [name]  63 y.o. female  PRE-OPERATIVE DIAGNOSIS:  INCISIONAL HERNIA  POST-OPERATIVE DIAGNOSIS:  INCISIONAL HERNIA  PROCEDURE:  Procedure(s): OPEN LYSIS OF ADHESION x 20min (N/A) INCISIONAL HERNIA REPAIR WITH MESH,  RETRORECTUS APPROACH WITH BILATERAL MUSCULOCUTANEOUS FLAP ADVANCEMENTS (N/A) BILATERAL TAPP BLOCKS WITH EXPAREL  SURGEON:  Surgeon(s) and Role:    * Ralene Ok, MD - Primary    * Gosai, Puja, PA-C  ANESTHESIA:   regional and general  EBL:  20 mL   BLOOD ADMINISTERED:none  DRAINS: none   LOCAL MEDICATIONS USED:  OTHER 40CC EXPAREL  SPECIMEN:  No Specimen  DISPOSITION OF SPECIMEN:  N/A  COUNTS:  YES  TOURNIQUET:  * No tourniquets in log *  DICTATION: .Dragon Dictation Indication procedure: Patient is a 63 year old female who previously underwent emergent exploratory laparotomy for internal hernia and ischemic bowel.  Patient subsequently had a Hartman's place as well as a Hartman's reversal.  Approximately 2 to 3 months after Hartman's reversal she developed a midline hernia.  Secondary to pain patient decided had this repaired.  Findings: Patient had a moderate size lower midline incisional hernia.  There was no hernia at the area of her previous left colostomy site.  Patient had a 15 x 30 cm mesh of medium weight Prolene placed.  This was anchored bilaterally with 3 trans-fascial sutures.  This was anchored to the pubic tubercle using 0 novafils x2.  This was also anchored to the infrasternal trans-fascial area with 0 novafils x2.  Details procedure: After the patient was consented she was taken back to the OR and placed in supine position with bilateral SCDs in place.  She underwent general trach intubation.  Patient had Foley catheter placed.  Patient was then prepped and draped in sterile fashion.  An Charlie Pitter was placed.  Timeout was called all facts verified.  An upper midline incision was made just superior to the  area of the previous incision sites.  Cautery was used to maintain hemostasis and dissection was taken down to the linea alba.  This was incised and the peritoneum was entered bluntly.  Blunt dissection was used to extend the peritoneal dissection inferiorly.  The skin was then incised to the length of the previous skin incision.  At this time the fascia was retracted bilaterally with Coker clamps.  There were thin stringy adhesions to the anterior abdominal wall from the bowel.  These were taken down both with cautery as well as sharp dissection.  The dissection of the lyse of adhesions was taken laterally to the area of the psoas muscles bilaterally.  Adhesiolysis was approximately 60 minutes.  At this time the rectus fascia was retracted medially with Coker clamps.  The hernia sac as well as the rectus fascia was incised.  The posterior rectus sheath was dissected off both bluntly and sharply from the muscle belly.  This was done both inferiorly and superiorly.  Inferiorly this was extended to the preperitoneal space down to the pubic tubercle and Cooper's ligament.  Superiorly this was taken down just inferior to the sternal notch.  This was done both on the right and left sides.  There was an area on the left side of the previous colostomy had to be incised.  There is a peritoneal defect.  This was reapproximated using figure-of-eight 3-0 Vicryl's x2.  At this time the posterior fascia was grasped with Coker clamps.  This was medialized.  This came together easily in the midline  without undue tension.  At this time #1 PDS single-stranded sutures were then used to reapproximate the posterior rectus fascia in the midline in a standard running fashion.  At this time the area was measured.  The area of the retrorectus space measured approximately 15 x 30 cm.  A piece of Ethicon medium weight Prolene mesh was selected.  This was cut to shape and placed in the preperitoneal space.  This was anchored to Cooper's  ligament using 0 Novafil sutures x2.  Trans-fascial sutures x3 were done bilaterally.  Just inferior to the sternum trans-fascial sutures x2 were done bilaterally.  This allowed the mesh to lay flat and taught.  At this time Eviseal was used to secure the mesh to the midline closure.  20 cc of Exparel and 20 cc of water were mixed.  Tap blocks were performed bilaterally.  At this time secondary to some tension to the anterior fascia subcutaneous flaps were raised bilaterally.  This was to help ease the tension on the anterior fascial closure.  Perforating vessels were attempted to be preserved.  At this time a #1 PDS was used to reapproximate and advance the rectus muscle tissue to the midline.  There was some minimal tension in the center portion of the reapproximation.  At this time the previous scar was excised and the skin layer.  The skin was then reapproximated using staples.  Dermabond was placed in the stab incisions of the trans-fascial sutures.  Honeycomb dressing was then placed in the midline wound.  The patient taught the procedure well was taken to the recovery room in stable condition.        PLAN OF CARE: Admit for overnight observation  PATIENT DISPOSITION:  PACU - hemodynamically stable.   Delay start of Pharmacological VTE agent (>24hrs) due to surgical blood loss or risk of bleeding: yes

## 2019-04-09 NOTE — Anesthesia Procedure Notes (Signed)
Procedure Name: Intubation Date/Time: 04/09/2019 8:35 AM Performed by: Leonor Liv, CRNA Pre-anesthesia Checklist: Patient identified, Emergency Drugs available, Suction available and Patient being monitored Patient Re-evaluated:Patient Re-evaluated prior to induction Oxygen Delivery Method: Circle System Utilized Preoxygenation: Pre-oxygenation with 100% oxygen Induction Type: IV induction Ventilation: Mask ventilation without difficulty and Oral airway inserted - appropriate to patient size Laryngoscope Size: Mac and 3 Grade View: Grade I Tube type: Oral Tube size: 7.0 mm Number of attempts: 1 Airway Equipment and Method: Stylet and Oral airway Placement Confirmation: ETT inserted through vocal cords under direct vision,  positive ETCO2 and breath sounds checked- equal and bilateral Secured at: 21 cm Tube secured with: Tape Dental Injury: Teeth and Oropharynx as per pre-operative assessment

## 2019-04-09 NOTE — Anesthesia Postprocedure Evaluation (Signed)
Anesthesia Post Note  Patient: [name]  Procedure(s) Performed: OPEN LYSIS OF ADHESION (N/A ) INCISIONAL HERNIA REPAIR WITH MESH (N/A )     Patient location during evaluation: PACU Anesthesia Type: General Level of consciousness: awake and sedated Pain management: pain level controlled Vital Signs Assessment: post-procedure vital signs reviewed and stable Respiratory status: spontaneous breathing Cardiovascular status: stable Postop Assessment: no apparent nausea or vomiting Anesthetic complications: no    Last Vitals:  Vitals:   04/09/19 1140 04/09/19 1232  BP: (!) 103/59 (!) 103/57  Pulse: 63 65  Resp: 13 18  Temp:  36.9 C  SpO2: 100% 98%    Last Pain:  Vitals:   04/09/19 1232  TempSrc: Oral  PainSc:    Pain Goal:                   Huston Foley

## 2019-04-09 NOTE — Progress Notes (Signed)
Spoke with Dr. Rosendo Gros regarding Gabapentin. Pt.  Took dose of Lyrica this a.m. at home.  Will hold Gapabentin ERAS dose- per Dr. Rosendo Gros.

## 2019-04-09 NOTE — Transfer of Care (Signed)
Immediate Anesthesia Transfer of Care Note  Patient: [name]  Procedure(s) Performed: OPEN LYSIS OF ADHESION (N/A ) INCISIONAL HERNIA REPAIR WITH MESH (N/A )  Patient Location: PACU  Anesthesia Type:General  Level of Consciousness: awake, alert  and oriented  Airway & Oxygen Therapy: Patient Spontanous Breathing and Patient connected to nasal cannula oxygen  Post-op Assessment: Report given to RN, Post -op Vital signs reviewed and stable and Patient moving all extremities  Post vital signs: Reviewed and stable  Last Vitals:  Vitals Value Taken Time  BP    Temp    Pulse 72 04/09/2019 12:15 PM  Resp 15 04/09/2019 12:14 PM  SpO2 100 % 04/09/2019 12:15 PM  Vitals shown include unvalidated device data.  Last Pain:  Vitals:   04/09/19 1140  TempSrc:   PainSc: 8          Complications: No apparent anesthesia complications

## 2019-04-09 NOTE — Discharge Instructions (Signed)
[facility] _______[facility] ° °HERNIA REPAIR: POST OP INSTRUCTIONS ° °Always review your discharge instruction sheet given to you by the facility where your surgery was performed. °IF YOU HAVE DISABILITY OR FAMILY LEAVE FORMS, YOU MUST BRING THEM TO THE OFFICE FOR PROCESSING.   °DO NOT GIVE THEM TO YOUR DOCTOR. ° °1. A  prescription for pain medication may be given to you upon discharge.  Take your pain medication as prescribed, if needed.  If narcotic pain medicine is not needed, then you may take acetaminophen (Tylenol) or ibuprofen (Advil) as needed. °2. Take your usually prescribed medications unless otherwise directed. °If you need a refill on your pain medication, please contact your pharmacy.  They will contact our office to request authorization. Prescriptions will not be filled after 5 pm or on week-ends. °3. You should follow a light diet the first 24 hours after arrival home, such as soup and crackers, etc.  Be sure to include lots of fluids daily.  Resume your normal diet the day after surgery. °4.Most patients will experience some swelling and bruising around the umbilicus or in the groin and scrotum.  Ice packs and reclining will help.  Swelling and bruising can take several days to resolve.  °6. It is common to experience some constipation if taking pain medication after surgery.  Increasing fluid intake and taking a stool softener (such as Colace) will usually help or prevent this problem from occurring.  A mild laxative (Milk of Magnesia or Miralax) should be taken according to package directions if there are no bowel movements after 48 hours. °7. Unless discharge instructions indicate otherwise, you may remove your bandages 24-48 hours after surgery, and you may shower at that time.  You may have steri-strips (small skin tapes) in place directly over the incision.  These strips should be left on the skin for 7-10 days.  If your surgeon used skin glue on the incision, you may shower in  24 hours.  The glue will flake off over the next 2-3 weeks.  Any sutures or staples will be removed at the office during your follow-up visit. °8. ACTIVITIES:  You may resume regular (light) daily activities beginning the next day--such as daily self-care, walking, climbing stairs--gradually increasing activities as tolerated.  You may have sexual intercourse when it is comfortable.  Refrain from any heavy lifting or straining until approved by your doctor. ° °a.You may drive when you are no longer taking prescription pain medication, you can comfortably wear a seatbelt, and you can safely maneuver your car and apply brakes. °b.RETURN TO WORK:   °_____________________________________________ ° °9.You should see your doctor in the office for a follow-up appointment approximately 2-3 weeks after your surgery.  Make sure that you call for this appointment within a day or two after you arrive home to insure a convenient appointment time. °10.OTHER INSTRUCTIONS: _________________________ °   _____________________________________ ° °WHEN TO CALL YOUR DOCTOR: °1. Fever over 101.0 °2. Inability to urinate °3. Nausea and/or vomiting °4. Extreme swelling or bruising °5. Continued bleeding from incision. °6. Increased pain, redness, or drainage from the incision ° °The clinic staff is available to answer your questions during regular business hours.  Please don’t hesitate to call and ask to speak to one of the nurses for clinical concerns.  If you have a medical emergency, go to the nearest emergency room or call 911.  A surgeon from [facility] is always on call at the hospital ° ° °[address], [address], [place], [place]  [address] ? ° [address] [address], [place], [place]   [address] °[phone] ? [phone] ? FAX [phone] °Web site: [email or web address] ° °

## 2019-04-09 NOTE — Progress Notes (Signed)
Patient had home machine and mask at bedside upon RT entering; machine didn't have any frayed wires or cords. This RT setup machine and filled water chamber with sterile water.  Patient tested operation of machine and confirmed that all was fine with machine and mask. Patient is able to apply mask when she is ready for bed.

## 2019-04-09 NOTE — Progress Notes (Signed)
Incision site bleeding, dressing marked, VSS. Will continue to monitor.

## 2019-04-09 NOTE — H&P (Signed)
History of Present Illness Jill Ok MD; 01/15/2019 9:07 AM) The patient is a 63 year old female who presents with an incisional hernia. Patient follow back up today after CT scan. Patient's CT scan does reveal possible a 5-6 cm incisional hernia in the midline. The area of the previous ostomy appears to be without hernia at this time. The patient will like to proceed with surgery which will be scheduled in the near future.    --------------------------------------- Chief Complaint:Incisional hernia  63 year old female who comes in for follow-up. Patient Patient under went exploratory laparoscopy for closed loop obstruction, colostomy, ultimately colostomy reversal done laparoscopically. Her most recent operation she was seen to have a incisional hernia from a midline incision. The left lower quadrant area near the previous ostomy site.  Patient comes in today secondary to protrusion of the hernia of the last 1-2 weeks. States this is associated with pain. Patient's activities are increasing this appears to interfere with her activities. Patient has had no signs or symptoms of incarceration or strangulation.  Past Medical History:  Diagnosis Date  . Asthma   . Back pain   . Chronic fatigue syndrome    Dr. Sabra Heck  . Complication of anesthesia   . Cough    /SOB, PFT's nl, improved with Bronchodilation 8/11  . Dysrhythmia   . Esophageal spasm    GERD Dr. Sabra Heck, Dr. Wynetta Emery; egd 2006 nl; UGI 3/13 Small HH, moderate GERD, nl motility; seen at [facility] ([place]), egd? neg, sone improvement on sucralfate susp  . Fibromyalgia   . Foot pain    Dr. Rushie Nyhan  . High triglycerides   . Hypothyroidism    Dr. Lewis Shock 4/14  . Leukocytoclastic vasculitis ([place])    Dr. Tonia Brooms  . Low HDL (under 40)   . Mitral valve prolapse   . Paroxysmal atrial tachycardia (HCC)    Dr. Marlou Porch  . Pernicious anemia    Dr. Sabra Heck  . PONV (postoperative nausea and vomiting)    along time ago had  nausea ans vomiting after surgery-25 years ago  . Reflux    ? delayed gastric emptying--GES nl 01/2012 (6% at 2hrs)  . RLS (restless legs syndrome)    low ferritin Dr. Sabra Heck  . Sleep apnea   . Uterine carcinoma (HCC)    Dr. Polly Cobia    Past Surgical History:  Procedure Laterality Date  . ABDOMINAL HYSTERECTOMY    . bladder tack     x 2  . BUNIONECTOMY    . CHOLECYSTECTOMY    . COLONOSCOPY     scr, 12/2008 ([facility]) nl  . COLOSTOMY Left 06/05/2018   Procedure: COLOSTOMY;  Surgeon: Jill Ok, MD;  Location: [place];  Service: General;  Laterality: Left;  . COLOSTOMY REVERSAL N/A 10/16/2018   Procedure: HARTMANN'S COLOSTOMY REVERSAL, RIGID PROCTOSCOPY;  Surgeon: Jill Ok, MD;  Location: [facility];  Service: General;  Laterality: N/A;  . HAMMER TOE SURGERY    . LAPAROSCOPY N/A 10/16/2018   Procedure: LAPAROSCOPY DIAGNOSTIC WITH LYSIS OF ADHESIONS;  Surgeon: Jill Ok, MD;  Location: [facility];  Service: General;  Laterality: N/A;  . LUNG BIOPSY     L Lower lung pulm nodules, largest 4.41mm, low risk, repeat CT in 1 yr now following with pulm at [facility]; 9/13 Stable tiny lung nodules, no f/u needed  . METATARSAL OSTEOTOMY    . NECK SURGERY     x 2  . PARTIAL COLECTOMY N/A 06/05/2018   Procedure: EXPLORATORY LAPAROTOMY AND PARTIAL COLECTOMY;  Surgeon: Rosendo Gros,  Anne Hahn, MD;  Location: [place];  Service: General;  Laterality: N/A;  . TONSILLECTOMY    . US ECHOCARDIOGRAPHY     with Nuclear test, Dr. Marlou Porch, Low risk  . VESICOVAGINAL FISTULA CLOSURE W/ TAH       Allergies  Cipro *FLUOROQUINOLONES*  Minocycline HCl *TETRACYCLINES*  Ambien *HYPNOTICS/SEDATIVES/SLEEP DISORDER AGENTS*  Latex Gloves *MEDICAL DEVICES AND SUPPLIES*  Allergies Reconciled   Medication History  ALPRAZolam (1MG  Tablet, Oral) Active. Lyrica (75MG  Capsule, Oral) Active. Dexilant (60MG  Capsule DR, Oral) Active. Meloxicam (15MG  Tablet, Oral) Active. Metoprolol Succinate ER (25MG  Tablet ER 24HR,  Oral) Active. Venlafaxine HCl ER (37.5MG  Capsule ER 24HR, Oral) Active. Levothyroxine Sodium (137MCG Tablet, Oral) Active. BuPROPion HCl ER (XL) (150MG  Tablet ER 24HR, Oral) Active. Medications Reconciled    Review of Systems  All other systems negative   BP (!) 120/49   Pulse 75   Temp 98.6 F (37 C) (Oral)   Resp 18   Ht 5\' 5"  (1.651 m)   Wt 73 kg   SpO2 98%   BMI 26.79 kg/m   Physical Exam The physical exam findings are as follows: Note: Constitutional: No acute distress, conversant, appears stated age  Eyes: Anicteric sclerae, moist conjunctiva, no lid lag  Neck: No thyromegaly, trachea midline, no cervical lymphadenopathy  Lungs: Clear to auscultation biilaterally, normal respiratory effot  Cardiovascular: regular rate & rhythm, no murmurs, no peripheal edema, pedal pulses 2+  GI: Soft, no masses or hepatosplenomegaly, non-tender to palpation  MSK: Normal gait, no clubbing cyanosis, edema  Skin: No rashes, palpation reveals normal skin turgor  Psychiatric: Appropriate judgment and insight, oriented to person, place, and time  Abdomen Inspection Hernias - Incisional hernia - Reducible(Small approximately 3-4 cm incisions in the left lower portion of the midline wound. The ostomy site.).    Assessment & Plan INCISIONAL HERNIA, WITHOUT OBSTRUCTION OR GANGRENE (K43.2) Impression: 63 year old female with incisional hernia status post colostomy and colostomy reversal. 1. The patient will like to proceed to the operating room for open lysis of adhesions and incisional hernia repair with mesh, likely TAR  2. I discussed with the patient the signs and symptoms of incarceration and strangulation and the need to proceed to the ER should they occur.  3. I discussed with the patient the risks and benefits of the procedure to include but not limited to: Infection, bleeding, damage to surrounding structures, possible need for further surgery, possible nerve pain,  and possible recurrence. The patient was understanding and wishes to proceed.

## 2019-04-10 ENCOUNTER — Encounter (HOSPITAL_COMMUNITY): Payer: Self-pay | Admitting: General Surgery

## 2019-04-10 DIAGNOSIS — I341 Nonrheumatic mitral (valve) prolapse: Secondary | ICD-10-CM | POA: Diagnosis present

## 2019-04-10 DIAGNOSIS — M797 Fibromyalgia: Secondary | ICD-10-CM | POA: Diagnosis present

## 2019-04-10 DIAGNOSIS — Z79899 Other long term (current) drug therapy: Secondary | ICD-10-CM | POA: Diagnosis not present

## 2019-04-10 DIAGNOSIS — G2581 Restless legs syndrome: Secondary | ICD-10-CM | POA: Diagnosis present

## 2019-04-10 DIAGNOSIS — Z91048 Other nonmedicinal substance allergy status: Secondary | ICD-10-CM | POA: Diagnosis not present

## 2019-04-10 DIAGNOSIS — E781 Pure hyperglyceridemia: Secondary | ICD-10-CM | POA: Diagnosis present

## 2019-04-10 DIAGNOSIS — Z888 Allergy status to other drugs, medicaments and biological substances status: Secondary | ICD-10-CM | POA: Diagnosis not present

## 2019-04-10 DIAGNOSIS — F419 Anxiety disorder, unspecified: Secondary | ICD-10-CM | POA: Diagnosis present

## 2019-04-10 DIAGNOSIS — Z79890 Hormone replacement therapy (postmenopausal): Secondary | ICD-10-CM | POA: Diagnosis not present

## 2019-04-10 DIAGNOSIS — E039 Hypothyroidism, unspecified: Secondary | ICD-10-CM | POA: Diagnosis present

## 2019-04-10 DIAGNOSIS — K432 Incisional hernia without obstruction or gangrene: Secondary | ICD-10-CM | POA: Diagnosis present

## 2019-04-10 DIAGNOSIS — K660 Peritoneal adhesions (postprocedural) (postinfection): Secondary | ICD-10-CM | POA: Diagnosis present

## 2019-04-10 DIAGNOSIS — G4730 Sleep apnea, unspecified: Secondary | ICD-10-CM | POA: Diagnosis present

## 2019-04-10 DIAGNOSIS — Z881 Allergy status to other antibiotic agents status: Secondary | ICD-10-CM | POA: Diagnosis not present

## 2019-04-10 DIAGNOSIS — Z23 Encounter for immunization: Secondary | ICD-10-CM | POA: Diagnosis not present

## 2019-04-10 DIAGNOSIS — Z91040 Latex allergy status: Secondary | ICD-10-CM | POA: Diagnosis not present

## 2019-04-10 DIAGNOSIS — Z791 Long term (current) use of non-steroidal anti-inflammatories (NSAID): Secondary | ICD-10-CM | POA: Diagnosis not present

## 2019-04-10 DIAGNOSIS — Z1159 Encounter for screening for other viral diseases: Secondary | ICD-10-CM | POA: Diagnosis not present

## 2019-04-10 DIAGNOSIS — Z9049 Acquired absence of other specified parts of digestive tract: Secondary | ICD-10-CM | POA: Diagnosis not present

## 2019-04-10 DIAGNOSIS — J45909 Unspecified asthma, uncomplicated: Secondary | ICD-10-CM | POA: Diagnosis present

## 2019-04-10 DIAGNOSIS — Z8542 Personal history of malignant neoplasm of other parts of uterus: Secondary | ICD-10-CM | POA: Diagnosis not present

## 2019-04-10 DIAGNOSIS — I480 Paroxysmal atrial fibrillation: Secondary | ICD-10-CM | POA: Diagnosis present

## 2019-04-10 DIAGNOSIS — Z90710 Acquired absence of both cervix and uterus: Secondary | ICD-10-CM | POA: Diagnosis not present

## 2019-04-10 DIAGNOSIS — K219 Gastro-esophageal reflux disease without esophagitis: Secondary | ICD-10-CM | POA: Diagnosis present

## 2019-04-10 LAB — BASIC METABOLIC PANEL
Anion gap: 10 (ref 5–15)
BUN: 11 mg/dL (ref 8–23)
CO2: 24 mmol/L (ref 22–32)
Calcium: 8.5 mg/dL — ABNORMAL LOW (ref 8.9–10.3)
Chloride: 106 mmol/L (ref 98–111)
Creatinine, Ser: 0.92 mg/dL (ref 0.44–1.00)
GFR calc Af Amer: >60 mL/min (ref >60)
GFR calc non Af Amer: >60 mL/min (ref >60)
Glucose, Bld: 103 mg/dL — ABNORMAL HIGH (ref 70–99)
Potassium: 4.1 mmol/L (ref 3.5–5.1)
Sodium: 140 mmol/L (ref 135–145)

## 2019-04-10 LAB — CBC
HCT: 34.1 % — ABNORMAL LOW (ref 36.0–46.0)
Hemoglobin: 11.1 g/dL — ABNORMAL LOW (ref 12.0–15.0)
MCH: 31.8 pg (ref 26.0–34.0)
MCHC: 32.6 g/dL (ref 30.0–36.0)
MCV: 97.7 fL (ref 80.0–100.0)
Platelets: 281 10*3/uL (ref 150–400)
RBC: 3.49 MIL/uL — ABNORMAL LOW (ref 3.87–5.11)
RDW: 13.7 % (ref 11.5–15.5)
WBC: 14.1 10*3/uL — ABNORMAL HIGH (ref 4.0–10.5)
nRBC: 0 % (ref 0.0–0.2)

## 2019-04-10 MED ORDER — OXYCODONE HCL 5 MG PO TABS
5.0000 mg | ORAL_TABLET | ORAL | Status: DC | PRN
  Administered 2019-04-10 – 2019-04-11 (×4): 10 mg via ORAL
  Filled 2019-04-10 (×4): qty 2

## 2019-04-10 MED ORDER — ACETAMINOPHEN 325 MG PO TABS
650.0000 mg | ORAL_TABLET | Freq: Four times a day (QID) | ORAL | Status: DC | PRN
  Administered 2019-04-10: 650 mg via ORAL
  Filled 2019-04-10: qty 2

## 2019-04-10 MED ORDER — KETOROLAC TROMETHAMINE 30 MG/ML IJ SOLN: 30.0000 mg | Freq: Four times a day (QID) | INTRAMUSCULAR | Status: DC | PRN

## 2019-04-10 NOTE — Progress Notes (Signed)
1 Day Post-Op   Subjective/Chief Complaint: Pt with some abdominal soreness Ambulating tol CLD   Objective: Vital signs in last 24 hours: Temp:  [97 F (36.1 C)-98.6 F (37 C)] 98.4 F (36.9 C) (06/11 0531) Pulse Rate:  [60-109] 60 (06/11 0531) Resp:  [11-20] 17 (06/11 0531) BP: (100-120)/(49-92) 104/54 (06/11 0531) SpO2:  [95 %-100 %] 100 % (06/11 0531) Weight:  [73 kg-74.4 kg] 74.4 kg (06/10 1232) Last BM Date: 04/08/19  Intake/Output from previous day: 06/10 0701 - 06/11 0700 In: 4102.3 [P.O.:700; I.V.:3402.3] Out: 1450 [Urine:1400; Blood:50] Intake/Output this shift: Total I/O In: 1136.4 [P.O.:240; I.V.:896.4] Out: 1200 [Urine:1200]  General appearance: alert and cooperative GI: soft, non-tender; bowel sounds normal; no masses,  no organomegaly and som eblood DC in mid dressign area-stable  Lab Results:  Recent Labs    04/08/19 1151  WBC 9.7  HGB 13.5  HCT 41.4  PLT 349   BMET Recent Labs    04/08/19 1151  NA 141  K 4.7  CL 107  CO2 26  GLUCOSE 72  BUN 15  CREATININE 0.92  CALCIUM 9.4   Anti-infectives: Anti-infectives (From admission, onward)   Start     Dose/Rate Route Frequency Ordered Stop   04/09/19 0715  ceFAZolin (ANCEF) IVPB 2g/100 mL premix     2 g 200 mL/hr over 30 Minutes Intravenous On call to O.R. 04/09/19 0708 04/09/19 0820   04/09/19 0715  ceFAZolin (ANCEF) 2-4 GM/100ML-% IVPB    Note to Pharmacy: Lauro Franklin   : cabinet override      04/09/19 0715 04/09/19 0820      Assessment/Plan: s/p Procedure(s): OPEN LYSIS OF ADHESION (N/A) INCISIONAL HERNIA REPAIR WITH MESH (N/A) Advance diet ambulate in hall  Pain control Hopefully home tomorrow if con't to do well  LOS: 0 days    Ralene Ok 04/10/2019

## 2019-04-10 NOTE — Progress Notes (Signed)
Checked on patient for home CPAP machine needs.  No needs at this time, patient able to apply herself.

## 2019-04-10 NOTE — Plan of Care (Signed)

## 2019-04-11 MED ORDER — OXYCODONE HCL 5 MG PO TABS: 5.0000 mg | ORAL_TABLET | ORAL | 0 refills | Status: DC | PRN

## 2019-04-11 NOTE — Progress Notes (Signed)
[name] to be D/C'd  per MD order. Discussed with the patient and all questions fully answered.  VSS, Skin clean, dry and intact without evidence of skin break down, no evidence of skin tears noted.  IV catheter discontinued intact. Site without signs and symptoms of complications. Dressing and pressure applied.  An After Visit Summary was printed and given to the patient. Patient received prescription.  D/c education completed with patient/family including follow up instructions, medication list, d/c activities limitations if indicated, with other d/c instructions as indicated by MD - patient able to verbalize understanding, all questions fully answered.   Patient instructed to return to ED, call 911, or call MD for any changes in condition.   Patient to be escorted via [place], and D/C home via private auto.

## 2019-04-11 NOTE — Discharge Summary (Signed)
Physician Discharge Summary  Patient ID: DEA BITTING MRN: [id number] DOB/AGE: 1957 65 y.o.  Admit date: 04/09/2019 Discharge date: 04/11/2019  Admission Diagnoses:incisional hernia  Discharge Diagnoses:  Active Problems:   S/P hernia repair   Discharged Condition: good  Hospital Course: Pt was admitted with incisional hernia and underwetn exlap and hernia repair.  Please see op note for full details.  Post op pt was sent to the floor.  She had good pain control.  She was ambulating well on her own.  She was tol reg Po.  She was afebrile, deemed stable for DC and Dc'd home.  Consults: none  Significant Diagnostic Studies: none  Treatments: surgery: as above  Discharge Exam: Blood pressure (!) 106/59, pulse 60, temperature 98 F (36.7 C), temperature source Oral, resp. rate 16, height 5\' 5"  (1.651 m), weight 74.4 kg, SpO2 97 %. General appearance: alert and cooperative GI: soft, non-tender; bowel sounds normal; no masses,  no organomegaly and inc c/d/i  Disposition: Discharge disposition: 01-Home or Self Care       Discharge Instructions    Diet - low sodium heart healthy   Complete by: As directed    Increase activity slowly   Complete by: As directed      Allergies as of 04/11/2019      Reactions   Bee Venom Shortness Of Breath, Itching, Swelling   Peanut-containing Drug Products Anaphylaxis   Ambien [zolpidem Tartrate] Other (See Comments)   Hallucinations   Tape Itching, Swelling, Rash   Ciprofloxacin Nausea And Vomiting   Latex Rash   Minocycline Rash   Shellfish Allergy Rash      Medication List    TAKE these medications   acetaminophen 500 MG tablet Commonly known as: TYLENOL Take 2 tablets (1,000 mg total) by mouth every 6 (six) hours as needed. What changed: reasons to take this   albuterol 108 (90 Base) MCG/ACT inhaler Commonly known as: VENTOLIN HFA Inhale 2 puffs into the lungs every 4 (four) hours as needed for wheezing or shortness of  breath.   aspirin EC 81 MG tablet Take 1 tablet (81 mg total) by mouth daily.   atorvastatin 10 MG tablet Commonly known as: LIPITOR TAKE 1 TABLET BY MOUTH  DAILY   buPROPion 150 MG 24 hr tablet Commonly known as: WELLBUTRIN XL Take 450 mg by mouth daily.   clindamycin 1 % gel Commonly known as: CLINDAGEL Apply 1 application topically daily as needed (irritation).   dexlansoprazole 60 MG capsule Commonly known as: DEXILANT Take 60 mg by mouth daily.   diclofenac sodium 1 % Gel Commonly known as: VOLTAREN Apply 2 g topically 3 (three) times daily as needed (pain).   doxycycline 20 MG tablet Commonly known as: PERIOSTAT Take 20 mg by mouth 2 (two) times daily.   DULoxetine 30 MG capsule Commonly known as: CYMBALTA Take 90 mg by mouth at bedtime.   EPINEPHrine 0.3 mg/0.3 mL Soaj injection Commonly known as: EPI-PEN Inject 0.3 mg into the muscle as needed for anaphylaxis.   ferrous sulfate 325 (65 FE) MG tablet Take 325 mg by mouth daily with breakfast.   FINACEA EX Apply 1 application topically daily.   fluticasone 50 MCG/ACT nasal spray Commonly known as: FLONASE Place 2 sprays into both nostrils daily as needed for allergies.   fluticasone-salmeterol 115-21 MCG/ACT inhaler Commonly known as: ADVAIR HFA Inhale 2 puffs into the lungs 2 (two) times daily.   lactobacillus acidophilus Tabs tablet Take 1 tablet by mouth 2 (two)  times daily.   levothyroxine 125 MCG tablet Commonly known as: SYNTHROID Take 125 mcg by mouth at bedtime.   Lyrica 75 MG capsule Generic drug: pregabalin Take 75 mg by mouth 2 (two) times daily.   metoprolol succinate 25 MG 24 hr tablet Commonly known as: TOPROL-XL TAKE 1 TABLET BY MOUTH  DAILY   metronidazole 1 % cream Commonly known as: NORITATE Apply 1 application topically at bedtime.   naproxen sodium 220 MG tablet Commonly known as: ALEVE Take 220 mg by mouth at bedtime as needed (headache).   nitroGLYCERIN 0.4 MG SL  tablet Commonly known as: NITROSTAT Place 1 tablet (0.4 mg total) under the tongue every 5 (five) minutes as needed for chest pain.   oxyCODONE 5 MG immediate release tablet Commonly known as: Oxy IR/ROXICODONE Take 1-2 tablets (5-10 mg total) by mouth every 4 (four) hours as needed for moderate pain or severe pain.   PROBIOTIC PO Take 2 capsules by mouth daily.   vitamin B-12 1000 MCG tablet Commonly known as: CYANOCOBALAMIN Take 1,000 mcg by mouth daily.   Vitamin D3 25 MCG (1000 UT) Caps Take 2,000 Units by mouth 2 (two) times a day.      Follow-up Information    Ralene Ok, MD. Schedule an appointment as soon as possible for a visit in 2 weeks.   Specialty: General Surgery Why: Post op visit Contact information: [place]  [id number] [phone]           Signed: Ralene Ok 04/11/2019, 6:41 AM

## 2019-05-12 DIAGNOSIS — L309 Dermatitis, unspecified: Secondary | ICD-10-CM | POA: Diagnosis not present

## 2019-06-10 DIAGNOSIS — E039 Hypothyroidism, unspecified: Secondary | ICD-10-CM | POA: Diagnosis not present

## 2019-06-25 DIAGNOSIS — G4733 Obstructive sleep apnea (adult) (pediatric): Secondary | ICD-10-CM | POA: Diagnosis not present

## 2019-06-25 DIAGNOSIS — F5101 Primary insomnia: Secondary | ICD-10-CM | POA: Diagnosis not present

## 2019-06-25 DIAGNOSIS — R413 Other amnesia: Secondary | ICD-10-CM | POA: Diagnosis not present

## 2019-06-26 DIAGNOSIS — G4733 Obstructive sleep apnea (adult) (pediatric): Secondary | ICD-10-CM | POA: Diagnosis not present

## 2019-06-26 DIAGNOSIS — R413 Other amnesia: Secondary | ICD-10-CM | POA: Diagnosis not present

## 2019-06-26 DIAGNOSIS — F5101 Primary insomnia: Secondary | ICD-10-CM | POA: Diagnosis not present

## 2019-06-26 DIAGNOSIS — Z23 Encounter for immunization: Secondary | ICD-10-CM | POA: Diagnosis not present

## 2019-06-30 ENCOUNTER — Other Ambulatory Visit: Payer: Self-pay | Admitting: Family Medicine

## 2019-06-30 DIAGNOSIS — R413 Other amnesia: Secondary | ICD-10-CM

## 2019-07-14 ENCOUNTER — Ambulatory Visit
Admission: RE | Admit: 2019-07-14 | Discharge: 2019-07-14 | Disposition: A | Discharge status: Home / Self Care | Payer: Medicare Other | Source: Ambulatory Visit | Attending: Family Medicine | Admitting: Family Medicine

## 2019-07-14 ENCOUNTER — Other Ambulatory Visit: Payer: Self-pay

## 2019-07-14 DIAGNOSIS — R410 Disorientation, unspecified: Secondary | ICD-10-CM | POA: Diagnosis not present

## 2019-07-14 DIAGNOSIS — R413 Other amnesia: Secondary | ICD-10-CM | POA: Diagnosis not present

## 2019-07-14 MED ORDER — GADOBENATE DIMEGLUMINE 529 MG/ML IV SOLN
15.0000 mL | Freq: Once | INTRAVENOUS | Status: AC | PRN
  Administered 2019-07-14: 08:00:00 15 mL via INTRAVENOUS

## 2019-07-22 ENCOUNTER — Other Ambulatory Visit: Payer: Self-pay

## 2019-07-22 ENCOUNTER — Ambulatory Visit (INDEPENDENT_AMBULATORY_CARE_PROVIDER_SITE_OTHER): Payer: Medicare Other | Admitting: Neurology

## 2019-07-22 ENCOUNTER — Encounter: Payer: Self-pay | Admitting: Neurology

## 2019-07-22 VITALS — BP 127/73 | HR 71 | Temp 98.1°F | Ht 65.0 in | Wt 174.0 lb

## 2019-07-22 DIAGNOSIS — R413 Other amnesia: Secondary | ICD-10-CM | POA: Diagnosis not present

## 2019-07-22 DIAGNOSIS — R9089 Other abnormal findings on diagnostic imaging of central nervous system: Secondary | ICD-10-CM | POA: Diagnosis not present

## 2019-07-22 DIAGNOSIS — R5382 Chronic fatigue, unspecified: Secondary | ICD-10-CM | POA: Diagnosis not present

## 2019-07-22 DIAGNOSIS — I639 Cerebral infarction, unspecified: Secondary | ICD-10-CM | POA: Diagnosis not present

## 2019-07-22 DIAGNOSIS — I679 Cerebrovascular disease, unspecified: Secondary | ICD-10-CM | POA: Diagnosis not present

## 2019-07-22 DIAGNOSIS — G9332 Myalgic encephalomyelitis/chronic fatigue syndrome: Secondary | ICD-10-CM

## 2019-07-22 DIAGNOSIS — D510 Vitamin B12 deficiency anemia due to intrinsic factor deficiency: Secondary | ICD-10-CM

## 2019-07-22 DIAGNOSIS — R799 Abnormal finding of blood chemistry, unspecified: Secondary | ICD-10-CM | POA: Diagnosis not present

## 2019-07-22 MED ORDER — CYANOCOBALAMIN 1000 MCG/ML IJ SOLN
1000.0000 ug | Freq: Once | INTRAMUSCULAR | Status: AC
  Administered 2019-07-22: 17:00:00 1000 ug via INTRAMUSCULAR

## 2019-07-22 NOTE — Progress Notes (Signed)
Reason for visit: Memory disturbance  Referring physician: Dr. Rico Ala is a 63 y.o. female  History of present illness:  [name] is a 63 year old right-handed white female with a history of chronic fatigue syndrome.  The patient has had chronic fatigue since the 1990s, she does believe that her fatigue level has worsened over the last 6 months along with her memory.  The patient also has sleep apnea, she is just now getting on CPAP but has not been able to tolerate the treatment throughout the night.  She has interstitial cystitis and restless leg syndrome as well.  She recently was found to have a vitamin B12 deficiency, she has just started oral therapy.  The patient indicates that her memory appeared to change around January 2020.  She has had increasing problems with word finding and short-term memory issues.  She has not given up any activities daily living because of memory.  She is able to operate a motor vehicle without difficulty, she can keep up with medications and appointments and do the finances.  She reports that the only new medication she is on is trazodone as she sometimes has difficulty getting to sleep at night.  She denies issues controlling the bowels or the bladder, she does have some slight balance issues but no falls.  She reports no numbness or weakness of the extremities.  She does have a cataract on the left eye but otherwise no visual changes.  She denies headaches and no prior history of migraine.  She does report a strong family history of cerebrovascular disease, both parents and 1 sister died from stroke.  The patient had MRI evaluation of the brain recently that shows a mild to moderate degree of small vessel type changes in the deep white matter of both hemispheres.  The patient is sent to this office for further evaluation.  The patient is on antidepressant medications but she denies any significant troubles with depression.  Past Medical History:   Diagnosis Date  . Asthma   . Back pain   . Chronic fatigue syndrome    Dr. Sabra Heck  . Complication of anesthesia   . Cough    /SOB, PFT's nl, improved with Bronchodilation 8/11  . Dysrhythmia   . Esophageal spasm    GERD Dr. Sabra Heck, Dr. Wynetta Emery; egd 2006 nl; UGI 3/13 [facility] HH, moderate GERD, nl motility; seen at [facility] ([place]), egd? neg, sone improvement on sucralfate susp  . Fibromyalgia   . Foot pain    Dr. Rushie Nyhan  . High triglycerides   . Hypothyroidism    Dr. Lewis Shock 4/14  . Leukocytoclastic vasculitis ([place])    Dr. Tonia Brooms  . Low HDL (under 40)   . Memory loss   . Mitral valve prolapse   . Paroxysmal atrial tachycardia (HCC)    Dr. Marlou Porch  . Pernicious anemia    Dr. Sabra Heck  . PONV (postoperative nausea and vomiting)    along time ago had nausea ans vomiting after surgery-25 years ago  . Reflux    ? delayed gastric emptying--GES nl 01/2012 (6% at 2hrs)  . RLS (restless legs syndrome)    low ferritin Dr. Sabra Heck  . Sleep apnea   . Uterine carcinoma (HCC)    Dr. Polly Cobia    Past Surgical History:  Procedure Laterality Date  . ABDOMINAL HYSTERECTOMY    . bladder tack     x 2  . BUNIONECTOMY    . CHOLECYSTECTOMY    .  COLONOSCOPY     scr, 12/2008 ([facility]) nl  . COLOSTOMY Left 06/05/2018   Procedure: COLOSTOMY;  Surgeon: Ralene Ok, MD;  Location: [place];  Service: General;  Laterality: Left;  . COLOSTOMY REVERSAL N/A 10/16/2018   Procedure: HARTMANN'S COLOSTOMY REVERSAL, RIGID PROCTOSCOPY;  Surgeon: Ralene Ok, MD;  Location: [facility];  Service: General;  Laterality: N/A;  . HAMMER TOE SURGERY    . INCISIONAL HERNIA REPAIR  04/09/2019   x2  . INCISIONAL HERNIA REPAIR N/A 04/09/2019   Procedure: INCISIONAL HERNIA REPAIR WITH MESH;  Surgeon: Ralene Ok, MD;  Location: [place];  Service: General;  Laterality: N/A;  . LAPAROSCOPY N/A 10/16/2018   Procedure: LAPAROSCOPY DIAGNOSTIC WITH LYSIS OF ADHESIONS;  Surgeon: Ralene Ok, MD;  Location: [facility];   Service: General;  Laterality: N/A;  . LUNG BIOPSY     L Lower lung pulm nodules, largest 4.53mm, low risk, repeat CT in 1 yr now following with pulm at [facility]; 9/13 Stable tiny lung nodules, no f/u needed  . LYSIS OF ADHESION N/A 04/09/2019   Procedure: OPEN LYSIS OF ADHESION;  Surgeon: Ralene Ok, MD;  Location: [place];  Service: General;  Laterality: N/A;  . METATARSAL OSTEOTOMY    . NECK SURGERY     x 2  . PARTIAL COLECTOMY N/A 06/05/2018   Procedure: EXPLORATORY LAPAROTOMY AND PARTIAL COLECTOMY;  Surgeon: Ralene Ok, MD;  Location: [place];  Service: General;  Laterality: N/A;  . TONSILLECTOMY    . US ECHOCARDIOGRAPHY     with Nuclear test, Dr. Marlou Porch, Low risk  . VESICOVAGINAL FISTULA CLOSURE W/ TAH      Family History  Problem Relation Age of Onset  . Hashimoto's thyroiditis Mother   . High blood pressure Mother   . Stroke Mother   . Other Mother        Borderline DM  . Alzheimer's disease Mother   . Ulcerative colitis Father   . Meniere's disease Father   . Hearing loss Father   . Stroke Father     Social history:  reports that she has never smoked. She has never used smokeless tobacco. She reports current alcohol use. She reports that she does not use drugs.  Medications:  Prior to Admission medications   Medication Sig Start Date End Date Taking? Authorizing Provider  acetaminophen (TYLENOL) 500 MG tablet Take 2 tablets (1,000 mg total) by mouth every 6 (six) hours as needed. Patient taking differently: Take 1,000 mg by mouth every 6 (six) hours as needed for mild pain or headache.  06/14/18  Yes Saverio Danker, PA-C  albuterol (PROVENTIL HFA;VENTOLIN HFA) 108 (90 Base) MCG/ACT inhaler Inhale 2 puffs into the lungs every 4 (four) hours as needed for wheezing or shortness of breath. 06/13/16  Yes [provider]  aspirin EC 81 MG tablet Take 1 tablet (81 mg total) by mouth daily. 03/21/16  Yes Jerline Pain, MD  atorvastatin (LIPITOR) 10 MG tablet TAKE 1  TABLET BY MOUTH  DAILY Patient taking differently: Take 10 mg by mouth daily.  02/20/19  Yes Jerline Pain, MD  Azelaic Acid (FINACEA EX) Apply 1 application topically daily.   Yes [provider]  buPROPion (WELLBUTRIN XL) 150 MG 24 hr tablet Take 450 mg by mouth daily.    Yes [provider]  Cholecalciferol (VITAMIN D3) 25 MCG (1000 UT) CAPS Take 2,000 Units by mouth 2 (two) times a day.    Yes [provider]  dexlansoprazole (DEXILANT) 60 MG  capsule Take 60 mg by mouth daily.   Yes [provider]  diclofenac sodium (VOLTAREN) 1 % GEL Apply 2 g topically 3 (three) times daily as needed (pain).    Yes [provider]  doxycycline (PERIOSTAT) 20 MG tablet Take 20 mg by mouth 2 (two) times daily.   Yes [provider]  DULoxetine (CYMBALTA) 30 MG capsule Take 90 mg by mouth at bedtime.    Yes [provider]  EPINEPHrine 0.3 mg/0.3 mL IJ SOAJ injection Inject 0.3 mg into the muscle as needed for anaphylaxis.    Yes [provider]  ferrous sulfate 325 (65 FE) MG tablet Take 325 mg by mouth daily with breakfast.   Yes [provider]  fluticasone (FLONASE) 50 MCG/ACT nasal spray Place 2 sprays into both nostrils daily as needed for allergies.    Yes [provider]  fluticasone-salmeterol (ADVAIR HFA) 115-21 MCG/ACT inhaler Inhale 2 puffs into the lungs 2 (two) times daily.    Yes [provider]  lactobacillus acidophilus (BACID) TABS tablet Take 1 tablet by mouth 2 (two) times daily.   Yes [provider]  levothyroxine (SYNTHROID) 125 MCG tablet Take 125 mcg by mouth at bedtime.    Yes [provider]  metoprolol succinate (TOPROL-XL) 25 MG 24 hr tablet TAKE 1 TABLET BY MOUTH  DAILY Patient taking differently: Take 25 mg by mouth daily.  02/20/19  Yes Jerline Pain, MD  metronidazole (NORITATE) 1 % cream Apply 1 application topically at bedtime.    Yes [provider]   nitroGLYCERIN (NITROSTAT) 0.4 MG SL tablet Place 1 tablet (0.4 mg total) under the tongue every 5 (five) minutes as needed for chest pain. 12/12/17  Yes Jerline Pain, MD  pregabalin (LYRICA) 75 MG capsule Take 75 mg by mouth 2 (two) times daily. 12/06/17  Yes [provider]  Probiotic Product (PROBIOTIC PO) Take 2 capsules by mouth daily.   Yes [provider]  traZODone (DESYREL) 50 MG tablet Take 50 mg by mouth at bedtime. 0.5-1 tab at night   Yes [provider]  vitamin B-12 (CYANOCOBALAMIN) 1000 MCG tablet Take 1,000 mcg by mouth daily.   Yes [provider]  naproxen sodium (ALEVE) 220 MG tablet Take 220 mg by mouth at bedtime as needed (headache).    [provider]      Allergies  Allergen Reactions  . Bee Venom Shortness Of Breath, Itching and Swelling  . Peanut-Containing Drug Products Anaphylaxis  . Ambien [Zolpidem Tartrate] Other (See Comments)    Hallucinations  . Tape Itching, Swelling and Rash  . Ciprofloxacin Nausea And Vomiting  . Latex Rash  . Minocycline Rash  . Shellfish Allergy Rash    ROS:  Out of a complete 14 system review of symptoms, the patient complains only of the following symptoms, and all other reviewed systems are negative.  Memory disturbance Fatigue  Blood pressure 127/73, pulse 71, temperature 98.1 F (36.7 C), temperature source Temporal, height 5\' 5"  (1.651 m), weight 174 lb (78.9 kg).  Physical Exam  General: The patient is alert and cooperative at the time of the examination.  Eyes: Pupils are equal, round, and reactive to light. Discs are flat bilaterally.  Neck: The neck is supple, no carotid bruits are noted.  Respiratory: The respiratory examination is clear.  Cardiovascular: The cardiovascular examination reveals a regular rate and rhythm, no obvious murmurs or rubs are noted.  Skin: Extremities are without significant edema.  Neurologic  Exam  Mental status: The patient is alert  and oriented x 3 at the time of the examination. The patient has apparent normal recent and remote memory, with an apparently normal attention span and concentration ability.  Mini-Mental status examination done today shows a total score of 29/30.  Cranial nerves: Facial symmetry is present. There is good sensation of the face to pinprick and soft touch bilaterally. The strength of the facial muscles and the muscles to head turning and shoulder shrug are normal bilaterally. Speech is well enunciated, no aphasia or dysarthria is noted. Extraocular movements are full. Visual fields are full. The tongue is midline, and the patient has symmetric elevation of the soft palate. No obvious hearing deficits are noted.  Motor: The motor testing reveals 5 over 5 strength of all 4 extremities. Good symmetric motor tone is noted throughout.  Sensory: Sensory testing is intact to pinprick, soft touch, vibration sensation, and position sense on all 4 extremities. No evidence of extinction is noted.  Coordination: Cerebellar testing reveals good finger-nose-finger and heel-to-shin bilaterally.  Gait and station: Gait is normal. Tandem gait is slightly unsteady. Romberg is negative. No drift is seen.  Reflexes: Deep tendon reflexes are symmetric and normal bilaterally. Toes are downgoing bilaterally.   MRI brain 07/14/19:  IMPRESSION: No evidence of acute intracranial abnormality.  Moderate scattered signal changes within the cerebral white matter are advanced for age. Findings are nonspecific and differential considerations include chronic small vessel ischemic disease, a demyelinating process such as multiple sclerosis, sequelae of a prior infectious/inflammatory process, vasculitis and migraine headaches, among others.  Parenchymal volume is normal for age.  * MRI scan images were reviewed online. I agree with the written report.    Assessment/Plan:  1.  Chronic fatigue syndrome  2.  Mild  memory disturbance  3.  Vitamin B12 deficiency  4.  Sleep apnea on CPAP   5.  Abnormal MRI brain  This patient has multiple issues that may worsen cognitive functioning.  She has seen the parallel between increased fatigue and worsening memory.  Typically, individuals with chronic fatigue syndrome have significant cognitive processing problems.  The patient also has sleep apnea and a recently discovered B12 deficiency.  We will administer a vitamin B12 shot today, the patient will continue her oral B12 injections.  The patient will be sent for blood work, she has a strong family history of cerebrovascular disease, blood work in part will be done for evaluation for inherited coagulopathies.  She will follow-up in 5 months, we will follow the MRI of the brain over time.  Otherwise, she has no significant risk factors for stroke.  Jill Alexanders MD 07/22/2019 4:22 PM  [facility] [address] [place], [place] [address]  Phone [phone] Fax [phone]

## 2019-07-25 DIAGNOSIS — H2513 Age-related nuclear cataract, bilateral: Secondary | ICD-10-CM | POA: Diagnosis not present

## 2019-07-31 ENCOUNTER — Telehealth: Payer: Self-pay | Admitting: Neurology

## 2019-07-31 DIAGNOSIS — R9089 Other abnormal findings on diagnostic imaging of central nervous system: Secondary | ICD-10-CM

## 2019-07-31 DIAGNOSIS — E7211 Homocystinuria: Secondary | ICD-10-CM

## 2019-07-31 DIAGNOSIS — D689 Coagulation defect, unspecified: Secondary | ICD-10-CM

## 2019-07-31 DIAGNOSIS — E7212 Methylenetetrahydrofolate reductase deficiency: Secondary | ICD-10-CM

## 2019-07-31 LAB — HEPATITIS C ANTIBODY: Hep C Virus Ab: <0.1 s/co ratio (ref 0.0–0.9)

## 2019-07-31 LAB — LUPUS ANTICOAGULANT
Dilute Viper Venom Time: 29.8 s (ref 0.0–47.0)
PTT Lupus Anticoagulant: 33.1 s (ref 0.0–51.9)
Thrombin Time: 17.6 s (ref 0.0–23.0)
dPT Confirm Ratio: 0.79 Ratio (ref 0.00–1.40)
dPT: 36 s (ref 0.0–55.0)

## 2019-07-31 LAB — ANA W/REFLEX: Anti Nuclear Antibody (ANA): NEGATIVE

## 2019-07-31 LAB — SEDIMENTATION RATE: Sed Rate: 20 mm/hr (ref 0–40)

## 2019-07-31 LAB — PAN-ANCA
ANCA Proteinase 3: <3.5 U/mL (ref 0.0–3.5)
Atypical pANCA: <1:20 {titer}
C-ANCA: <1:20 {titer}
Myeloperoxidase Ab: <9 U/mL (ref 0.0–9.0)
P-ANCA: <1:20 {titer}

## 2019-07-31 LAB — RHEUMATOID FACTOR: Rheumatoid fact SerPl-aCnc: 11.3 IU/mL (ref 0.0–13.9)

## 2019-07-31 LAB — MTHFR DNA ANALYSIS

## 2019-07-31 LAB — ANGIOTENSIN CONVERTING ENZYME: Angio Convert Enzyme: 64 U/L (ref 14–82)

## 2019-07-31 LAB — FACTOR 5 LEIDEN

## 2019-07-31 LAB — RPR: RPR Ser Ql: NONREACTIVE

## 2019-07-31 NOTE — Telephone Encounter (Signed)
I called the patient.  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  We will check a homocystine level.  Given the abnormal findings by MRI, we will send her for a transcranial Doppler bubble study to exclude a patent foramen ovale.

## 2019-08-04 ENCOUNTER — Other Ambulatory Visit (INDEPENDENT_AMBULATORY_CARE_PROVIDER_SITE_OTHER): Payer: Self-pay

## 2019-08-04 ENCOUNTER — Other Ambulatory Visit: Payer: Self-pay

## 2019-08-04 DIAGNOSIS — E7211 Homocystinuria: Secondary | ICD-10-CM | POA: Diagnosis not present

## 2019-08-04 DIAGNOSIS — E7212 Methylenetetrahydrofolate reductase deficiency: Secondary | ICD-10-CM | POA: Diagnosis not present

## 2019-08-04 DIAGNOSIS — Z0289 Encounter for other administrative examinations: Secondary | ICD-10-CM

## 2019-08-06 LAB — HOMOCYSTEINE: Homocysteine: 10.1 umol/L (ref 0.0–17.2)

## 2019-08-11 DIAGNOSIS — E039 Hypothyroidism, unspecified: Secondary | ICD-10-CM | POA: Diagnosis not present

## 2019-08-13 ENCOUNTER — Other Ambulatory Visit (HOSPITAL_COMMUNITY): Payer: PRIVATE HEALTH INSURANCE

## 2019-08-14 ENCOUNTER — Ambulatory Visit (HOSPITAL_COMMUNITY)
Admission: RE | Admit: 2019-08-14 | Discharge: 2019-08-14 | Disposition: A | Discharge status: Home / Self Care | Payer: Medicare Other | Source: Ambulatory Visit | Attending: Neurology | Admitting: Neurology

## 2019-08-14 ENCOUNTER — Telehealth: Payer: Self-pay | Admitting: Neurology

## 2019-08-14 ENCOUNTER — Other Ambulatory Visit: Payer: Self-pay

## 2019-08-14 DIAGNOSIS — R9089 Other abnormal findings on diagnostic imaging of central nervous system: Secondary | ICD-10-CM | POA: Diagnosis not present

## 2019-08-14 DIAGNOSIS — D689 Coagulation defect, unspecified: Secondary | ICD-10-CM | POA: Diagnosis not present

## 2019-08-14 NOTE — Telephone Encounter (Signed)
I called the patient.  The transcranial Doppler study confirms the presence of a PFO and a medium to large size.  The patient has a documented coagulopathy, at risk for venous clots.  She is on aspirin.  I will contact Dr. Leonie Man and get an opinion whether or not this would be an indication for PFO closure, the patient has not had a clinical TIA or stroke but she does have multiple white matter lesions by MRI brain without other risk factors for stroke.   Transcranial Doppler 08/14/19:  Summary:   A vascular evaluation was performed. The right middle cerebral artery was studied. An IV was inserted into the patient's right forearm. Verbal informed consent was obtained.   HITS heard at rest and during Valsalva. Partial curtain effect during Valsalva.  Medium to Large PFO size.

## 2019-08-15 NOTE — Telephone Encounter (Signed)
Interesting case . Even though the increased incidence of white matter brain hyperintensities have been described in patients with PFO there have been no studies showing benefit of endovascular PFO closure in this situation.  Unless patient has a TIA or a stroke this would not be indicated.  She has factor V Leiden heterozygote state and in the absence of proven DVT or PE she does not mandate long-term anticoagulation.  I reviewed continue antiplatelet therapy for now.  Her homocystine is normal and her heterozygous state for MHTR mutation puts her at risk for hyper homocystinemia.  Her homocystine level however appears normal.  If you want to be aggressive and treat her prophylactically you can give  Cerefolin NAC 1 tablet daily which will bypass the mutation.

## 2019-08-17 NOTE — Telephone Encounter (Signed)
I called the patient.  I have contacted the patient's cardiologist and Dr. Leonie Man, they are both basically saying the same thing, unless the patient has a clinical event with a TIA or stroke or DVT, aspirin therapy is all that is recommended, closure of the PFO is not indicated.  I discussed this with the patient.

## 2019-08-25 ENCOUNTER — Encounter: Payer: Self-pay | Admitting: Cardiology

## 2019-08-26 ENCOUNTER — Telehealth (INDEPENDENT_AMBULATORY_CARE_PROVIDER_SITE_OTHER): Payer: Medicare Other | Admitting: Cardiology

## 2019-08-26 ENCOUNTER — Other Ambulatory Visit: Payer: Self-pay

## 2019-08-26 ENCOUNTER — Encounter: Payer: Self-pay | Admitting: Cardiology

## 2019-08-26 VITALS — BP 143/79 | HR 66 | Ht 65.0 in | Wt 170.0 lb

## 2019-08-26 DIAGNOSIS — Q211 Atrial septal defect: Secondary | ICD-10-CM

## 2019-08-26 DIAGNOSIS — E7800 Pure hypercholesterolemia, unspecified: Secondary | ICD-10-CM

## 2019-08-26 DIAGNOSIS — Q2112 Patent foramen ovale: Secondary | ICD-10-CM

## 2019-08-26 DIAGNOSIS — I471 Supraventricular tachycardia: Secondary | ICD-10-CM | POA: Diagnosis not present

## 2019-08-26 NOTE — Patient Instructions (Signed)
Medication Instructions:  The current medical regimen is effective;  continue present plan and medications.  *If you need a refill on your cardiac medications before your next appointment, please call your pharmacy*  Follow-Up: At [facility], you and your health needs are our priority.  As part of our continuing mission to provide you with exceptional heart care, we have created designated Provider Care Teams.  These Care Teams include your primary Cardiologist (physician) and Advanced Practice Providers (APPs -  Physician Assistants and Nurse Practitioners) who all work together to provide you with the care you need, when you need it.  Your next appointment:   12 months  The format for your next appointment:   In Person  Provider:   You may see Mark Skains, MD or one of the following Advanced Practice Providers on your designated Care Team:    Lori Gerhardt, NP  Laura Ingold, NP  Jill McDaniel, NP   Thank you for choosing [facility]!!      

## 2019-08-26 NOTE — Progress Notes (Signed)
Virtual Visit via Video Note   This visit type was conducted due to national recommendations for restrictions regarding the COVID-19 Pandemic (e.g. social distancing) in an effort to limit this patient's exposure and mitigate transmission in our community.  Due to her co-morbid illnesses, this patient is at least at moderate risk for complications without adequate follow up.  This format is felt to be most appropriate for this patient at this time.  All issues noted in this document were discussed and addressed.  A limited physical exam was performed with this format.  Please refer to the patient's chart for her consent to telehealth for [facility].   Evaluation Performed:  Follow-up visit  Date:  08/26/2019   ID:  [name], DOB [birth year], MRN [id number]  Patient Location: Home Provider Location: Home  PCP:  Kathyrn Lass, MD  Cardiologist:  Candee Furbish, MD  Electrophysiologist:  None   Chief Complaint: Palpitations follow-up  History of Present Illness:    [name] is a 63 y.o. female with history of paroxysmal atrial tachycardia well-controlled on metoprolol.  She is doing very well with regards to her cardiac cath.  Taking low-dose atorvastatin for prevention, excellent.  Back in 2015 had minimal soft plaque bilateral carotid arteries.  Taking low-dose aspirin as well for secondary prevention.  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  She has a hernia that will be repaired hopefully in June.  She denies any cardiac symptoms.  Reminded me of her sisters cardiovascular illness.  08/26/2019-here for the discussion of PFO.  Lengthy discussion took place.  Please see below for full details.  Continue with aspirin statin blood pressure control.  Exercise.  Overall no changes.    The patient does not have symptoms concerning for COVID-19 infection (fever, chills, cough, or new shortness of breath).    Past Medical  History:  Diagnosis Date  . Asthma   . Back pain   . Chronic fatigue syndrome    Dr. Sabra Heck  . Complication of anesthesia   . Cough    /SOB, PFT's nl, improved with Bronchodilation 8/11  . Dysrhythmia   . Esophageal spasm    GERD Dr. Sabra Heck, Dr. Wynetta Emery; egd 2006 nl; UGI 3/13 Small HH, moderate GERD, nl motility; seen at [facility] ([place]), egd? neg, sone improvement on sucralfate susp  . Fibromyalgia   . Foot pain    Dr. Rushie Nyhan  . High triglycerides   . Hypothyroidism    Dr. Lewis Shock 4/14  . Leukocytoclastic vasculitis ([place])    Dr. Tonia Brooms  . Low HDL (under 40)   . Memory loss   . Mitral valve prolapse   . Paroxysmal atrial tachycardia (HCC)    Dr. Marlou Porch  . Pernicious anemia    Dr. Sabra Heck  . PONV (postoperative nausea and vomiting)    along time ago had nausea ans vomiting after surgery-25 years ago  . Reflux    ? delayed gastric emptying--GES nl 01/2012 (6% at 2hrs)  . RLS (restless legs syndrome)    low ferritin Dr. Sabra Heck  . Sleep apnea   . Uterine carcinoma (HCC)    Dr. Polly Cobia   Past Surgical History:  Procedure Laterality Date  . ABDOMINAL HYSTERECTOMY    . bladder tack     x 2  . BUNIONECTOMY    . CHOLECYSTECTOMY    . COLONOSCOPY     scr, 12/2008 ([facility]) nl  . COLOSTOMY Left 06/05/2018  Procedure: COLOSTOMY;  Surgeon: Ralene Ok, MD;  Location: [place];  Service: General;  Laterality: Left;  . COLOSTOMY REVERSAL N/A 10/16/2018   Procedure: HARTMANN'S COLOSTOMY REVERSAL, RIGID PROCTOSCOPY;  Surgeon: Ralene Ok, MD;  Location: [facility];  Service: General;  Laterality: N/A;  . HAMMER TOE SURGERY    . INCISIONAL HERNIA REPAIR  04/09/2019   x2  . INCISIONAL HERNIA REPAIR N/A 04/09/2019   Procedure: INCISIONAL HERNIA REPAIR WITH MESH;  Surgeon: Ralene Ok, MD;  Location: [facility];  Service: General;  Laterality: N/A;  . LAPAROSCOPY N/A 10/16/2018   Procedure: LAPAROSCOPY DIAGNOSTIC WITH LYSIS OF ADHESIONS;  Surgeon: Ralene Ok, MD;  Location: [facility];   Service: General;  Laterality: N/A;  . LUNG BIOPSY     L Lower lung pulm nodules, largest 4.76mm, low risk, repeat CT in 1 yr now following with pulm at [facility]; 9/13 Stable tiny lung nodules, no f/u needed  . LYSIS OF ADHESION N/A 04/09/2019   Procedure: OPEN LYSIS OF ADHESION;  Surgeon: Ralene Ok, MD;  Location: [place];  Service: General;  Laterality: N/A;  . METATARSAL OSTEOTOMY    . NECK SURGERY     x 2  . PARTIAL COLECTOMY N/A 06/05/2018   Procedure: EXPLORATORY LAPAROTOMY AND PARTIAL COLECTOMY;  Surgeon: Ralene Ok, MD;  Location: [place];  Service: General;  Laterality: N/A;  . TONSILLECTOMY    . US ECHOCARDIOGRAPHY     with Nuclear test, Dr. Marlou Porch, Low risk  . VESICOVAGINAL FISTULA CLOSURE W/ TAH       Current Meds  Medication Sig  . acetaminophen (TYLENOL) 500 MG tablet Take 2 tablets (1,000 mg total) by mouth every 6 (six) hours as needed.  Marland Kitchen albuterol (PROVENTIL HFA;VENTOLIN HFA) 108 (90 Base) MCG/ACT inhaler Inhale 2 puffs into the lungs every 4 (four) hours as needed for wheezing or shortness of breath.  Marland Kitchen aspirin EC 81 MG tablet Take 1 tablet (81 mg total) by mouth daily.  Marland Kitchen atorvastatin (LIPITOR) 10 MG tablet TAKE 1 TABLET BY MOUTH  DAILY  . Azelaic Acid (FINACEA EX) Apply 1 application topically daily.  Marland Kitchen buPROPion (WELLBUTRIN XL) 150 MG 24 hr tablet Take 450 mg by mouth daily.   . Cholecalciferol (VITAMIN D3) 25 MCG (1000 UT) CAPS Take 2,000 Units by mouth 2 (two) times a day.   Marland Kitchen dexlansoprazole (DEXILANT) 60 MG capsule Take 60 mg by mouth daily.  . diclofenac sodium (VOLTAREN) 1 % GEL Apply 2 g topically 3 (three) times daily as needed (pain).   Marland Kitchen doxycycline (PERIOSTAT) 20 MG tablet Take 20 mg by mouth 2 (two) times daily.  . DULoxetine (CYMBALTA) 30 MG capsule Take 90 mg by mouth at bedtime.   Marland Kitchen EPINEPHrine 0.3 mg/0.3 mL IJ SOAJ injection Inject 0.3 mg into the muscle as needed for anaphylaxis.   . ferrous sulfate 325 (65 FE) MG tablet Take 325 mg by mouth  daily with breakfast.  . fluticasone (FLONASE) 50 MCG/ACT nasal spray Place 2 sprays into both nostrils daily as needed for allergies.   . fluticasone-salmeterol (ADVAIR HFA) 115-21 MCG/ACT inhaler Inhale 2 puffs into the lungs 2 (two) times daily.   Marland Kitchen lactobacillus acidophilus (BACID) TABS tablet Take 1 tablet by mouth 2 (two) times daily.  Marland Kitchen levothyroxine (SYNTHROID) 125 MCG tablet Take 125 mcg by mouth at bedtime.   . metoprolol succinate (TOPROL-XL) 25 MG 24 hr tablet TAKE 1 TABLET BY MOUTH  DAILY  . metronidazole (NORITATE) 1 % cream Apply 1 application topically  at bedtime.   . naproxen sodium (ALEVE) 220 MG tablet Take 220 mg by mouth at bedtime as needed (headache).  . nitroGLYCERIN (NITROSTAT) 0.4 MG SL tablet Place 1 tablet (0.4 mg total) under the tongue every 5 (five) minutes as needed for chest pain.  . pregabalin (LYRICA) 75 MG capsule Take 75 mg by mouth 2 (two) times daily.  . Probiotic Product (PROBIOTIC PO) Take 2 capsules by mouth daily.  . traZODone (DESYREL) 50 MG tablet Take 50 mg by mouth at bedtime. 0.5-1 tab at night  . vitamin B-12 (CYANOCOBALAMIN) 1000 MCG tablet Take 1,000 mcg by mouth daily.     Allergies:   Bee venom, Peanut-containing drug products, Ambien [zolpidem tartrate], Tape, Ciprofloxacin, Latex, Minocycline, and Shellfish allergy   Social History   Tobacco Use  . Smoking status: Never Smoker  . Smokeless tobacco: Never Used  Substance Use Topics  . Alcohol use: Yes    Comment: maybe a glass of wine/month  . Drug use: No     Family Hx: The patient's family history includes Alzheimer's disease in her mother; Hashimoto's thyroiditis in her mother; Hearing loss in her father; High blood pressure in her mother; Meniere's disease in her father; Other in her mother; Stroke in her father, mother, and sister; Ulcerative colitis in her father.  Sister with cardiovascular illness  ROS:   Please see the history of present illness.    No current abdominal  pain no fevers chills shortness of breath syncope bleeding All other systems reviewed and are negative.   Prior CV studies:   The following studies were reviewed today:  Carotid Dopplers 03/26/2014- minimal soft plaque bilaterally.  Labs/Other Tests and Data Reviewed:    EKG:  An ECG dated 10/14/2018 was personally reviewed today and demonstrated:  Sinus rhythm with no other significant abnormalities  Recent Labs: 04/10/2019: BUN 11; Creatinine, Ser 0.92; Hemoglobin 11.1; Platelets 281; Potassium 4.1; Sodium 140   Recent Lipid Panel Lab Results  Component Value Date/Time   CHOL 132 05/23/2016 09:10 AM   TRIG 195 (H) 06/10/2018 04:25 AM   HDL 46 05/23/2016 09:10 AM   CHOLHDL 2.9 05/23/2016 09:10 AM   LDLCALC 40 05/23/2016 09:10 AM    Wt Readings from Last 3 Encounters:  08/26/19 170 lb (77.1 kg)  07/22/19 174 lb (78.9 kg)  04/09/19 164 lb 0.4 oz (74.4 kg)     Objective:    Vital Signs:  BP (!) 143/79   Pulse 66   Ht 5\' 5"  (1.651 m)   Wt 170 lb (77.1 kg)   BMI 28.29 kg/m    VITAL SIGNS:  reviewed GEN:  no acute distress EYES:  sclerae anicteric, EOMI - Extraocular Movements Intact RESPIRATORY:  normal respiratory effort, symmetric expansion CARDIOVASCULAR:  no peripheral edema SKIN:  no rash, lesions or ulcers. MUSCULOSKELETAL:  no obvious deformities. NEURO:  alert and oriented x 3, no obvious focal deficit PSYCH:  normal affect  ASSESSMENT & PLAN:    PFO  - on transcranial Doppler. MRI no stroke. Discussed with Dr. Burt Knack and Leonie Man. All agree with ASA 81 PO QD. Antiplatelet. Continue with aggressive risk factor prevention.  -She is concerned because her sister had a catastrophic stroke.  Paroxysmal atrial tachycardia -Controlled with metoprolol no severe palpitations.  Sigmoid volvulus - Hartman procedure.    Hyperlipidemia - Statin use no changes.  Has mild carotid plaque bilaterally.  Low-dose aspirin.  Doing well.  Previous atypical chest pain -  Continue to monitor.  No  current symptoms.  OSA  - CPAP. Dr. Maxwell Caul.  Had an AHI in the 40s, now down to 10.  No changes  COVID-19 Education: The signs and symptoms of COVID-19 were discussed with the patient and how to seek care for testing (follow up with PCP or arrange E-visit).  The importance of social distancing was discussed today.  Time:   Today, I have spent 21 minutes with the patient with telehealth technology discussing the above problems.     Medication Adjustments/Labs and Tests Ordered: Current medicines are reviewed at length with the patient today.  Concerns regarding medicines are outlined above.   Tests Ordered: No orders of the defined types were placed in this encounter.   Medication Changes: No orders of the defined types were placed in this encounter.   Disposition:  Follow up in 1 year(s)  Signed, Candee Furbish, MD  08/26/2019 5:10 PM    [facility]

## 2019-09-02 MED ORDER — HYDROXYCHLOROQUINE 200 MG TABLET
ORAL_TABLET | Freq: Two times a day (BID) | ORAL | 2 refills | 30.00000 days | Status: CP
Start: 2019-09-02 — End: 2020-09-01

## 2019-09-03 MED ORDER — PREDNISONE 10 MG TABLET
ORAL_TABLET | ORAL | 0 refills | 0.00000 days | Status: CP
Start: 2019-09-03 — End: ?

## 2019-09-12 ENCOUNTER — Ambulatory Visit: Admit: 2019-09-12 | Discharge: 2019-09-13 | Payer: MEDICARE

## 2019-09-12 DIAGNOSIS — M318 Other specified necrotizing vasculopathies: Secondary | ICD-10-CM | POA: Diagnosis not present

## 2019-09-12 DIAGNOSIS — L719 Rosacea, unspecified: Secondary | ICD-10-CM | POA: Diagnosis not present

## 2019-09-12 DIAGNOSIS — Z79899 Other long term (current) drug therapy: Secondary | ICD-10-CM | POA: Diagnosis not present

## 2019-09-19 DIAGNOSIS — K5904 Chronic idiopathic constipation: Secondary | ICD-10-CM | POA: Diagnosis not present

## 2019-09-19 DIAGNOSIS — K5901 Slow transit constipation: Secondary | ICD-10-CM | POA: Diagnosis not present

## 2019-10-21 ENCOUNTER — Other Ambulatory Visit: Payer: Self-pay

## 2019-11-05 ENCOUNTER — Telehealth: Admit: 2019-11-05 | Discharge: 2019-11-06 | Payer: MEDICARE

## 2019-11-05 DIAGNOSIS — L509 Urticaria, unspecified: Secondary | ICD-10-CM | POA: Diagnosis not present

## 2019-11-13 ENCOUNTER — Telehealth: Payer: Self-pay | Admitting: Neurology

## 2019-11-13 NOTE — Telephone Encounter (Signed)
I called [facility] billing dept. The patient's bill is from the following labs:  1) Pan-ANCA 2) Factor 5 leiden  R41.3 and I67.9 were the ICD10 codes submitted with her labs. The codes do not meet Medicare's requirements to cover this tests.   [facility] would need additional ICD10 codes to be able resubmit to her insurance plan.  This issue will be reviewed by Dr. Jannifer Franklin.  [facility] and the patient will be called back.

## 2019-11-13 NOTE — Telephone Encounter (Signed)
Pt states she got a bill from [employer] for $720.00 they told her to call [facility].  Pt said she has spoken with billing and they told her it may have been a possible error on behalf of RN re: where charge came from.  Pt asking for a call to discuss this charge from previous visit.

## 2019-11-13 NOTE — Telephone Encounter (Signed)
Would try codes R90.89, I 63.9, okay to try R79.9  The reason for these blood tests was because the patient appears to have white matter abnormalities that could be consistent with cerebrovascular disease/strokes.  The work-up is to determine whether the patient may have a vasculitic issue or a coagulopathy causing stroke.

## 2019-11-13 NOTE — Telephone Encounter (Signed)
I called [facility] back and and spoke to rep, Doran Heater.  She was provided with the additional IC10 codes.  They will re-file the claim w/ Medicare.  This process can take 30-45 days.  The patient may disregard the current bill.  The patient has been updated with this information.

## 2019-11-19 DIAGNOSIS — R222 Localized swelling, mass and lump, trunk: Secondary | ICD-10-CM | POA: Diagnosis not present

## 2019-11-19 DIAGNOSIS — K5900 Constipation, unspecified: Secondary | ICD-10-CM | POA: Diagnosis not present

## 2019-11-19 DIAGNOSIS — R198 Other specified symptoms and signs involving the digestive system and abdomen: Secondary | ICD-10-CM | POA: Diagnosis not present

## 2019-11-19 DIAGNOSIS — R1032 Left lower quadrant pain: Secondary | ICD-10-CM | POA: Diagnosis not present

## 2019-11-24 ENCOUNTER — Other Ambulatory Visit: Payer: Self-pay | Admitting: *Deleted

## 2019-11-24 DIAGNOSIS — Z91010 Allergy to peanuts: Secondary | ICD-10-CM | POA: Diagnosis not present

## 2019-11-24 DIAGNOSIS — J4540 Moderate persistent asthma, uncomplicated: Secondary | ICD-10-CM | POA: Diagnosis not present

## 2019-11-24 DIAGNOSIS — J3081 Allergic rhinitis due to animal (cat) (dog) hair and dander: Secondary | ICD-10-CM | POA: Diagnosis not present

## 2019-11-24 DIAGNOSIS — J3089 Other allergic rhinitis: Secondary | ICD-10-CM | POA: Diagnosis not present

## 2019-11-24 NOTE — Patient Outreach (Signed)
[facility]) Care Management  11/24/2019  [name] September 14, 1956 [id number]  RN Health Coach attempted outreach screening call to patient.  Patient was unavailable. HIPPA compliance voicemail message left with return callback number.  Plan: RN will call patient again within10 days.  [place] Care Management [phone]

## 2019-11-25 DIAGNOSIS — L501 Idiopathic urticaria: Secondary | ICD-10-CM | POA: Diagnosis not present

## 2019-11-28 ENCOUNTER — Ambulatory Visit: Payer: Medicare Other

## 2019-12-03 ENCOUNTER — Other Ambulatory Visit: Payer: Self-pay | Admitting: *Deleted

## 2019-12-03 NOTE — Patient Outreach (Signed)
[facility] [facility]) Care Management  12/03/2019  [name] [birth year] [id number]   RN Health Coach telephone call to patient.  [employer] compliance verified. RN discussed with patient about the services [facility] offered. RN explained the services of the RN on education and help with other concerns. RN discussed how the Education officer, museum can help with community resources and Regulatory affairs officer. RN checked and patient has an advance directive on file. RN explained that the Pharmacist can help you to better understand your medications and assist getting resources if you needed. Patient is able to afford medications. [employer] has declined services.  Patient is schedule to get her 1 COVID vaccine on 12/05/2019. Patient has agreed to received brochure.  Plan Screening closure No intervention needed at this time Agreed to receive brochure Unsuccessful outreach letter sent  [place] Management [phone]

## 2019-12-05 ENCOUNTER — Ambulatory Visit: Payer: Medicare Other | Attending: Internal Medicine

## 2019-12-05 DIAGNOSIS — Z23 Encounter for immunization: Secondary | ICD-10-CM

## 2019-12-05 NOTE — Progress Notes (Signed)
   Covid-19 Vaccination Clinic  Name:  [name]    MRN: [id number] DOB: Mar 25, 1956  12/05/2019  [name] was observed post Covid-19 immunization for 30 minutes based on pre-vaccination screening without incidence. She was provided with Vaccine Information Sheet and instruction to access the V-Safe system.   [name] was instructed to call 911 with any severe reactions post vaccine: Marland Kitchen Difficulty breathing  . Swelling of your face and throat  . A fast heartbeat  . A bad rash all over your body  . Dizziness and weakness    Immunizations Administered    Name Date Dose VIS Date Route   Pfizer COVID-19 Vaccine 12/05/2019 11:05 AM 0.3 mL 10/10/2019 Intramuscular   Manufacturer: [place]   Lot: [id number]   [place]: [id number]

## 2019-12-17 DIAGNOSIS — K5901 Slow transit constipation: Secondary | ICD-10-CM | POA: Diagnosis not present

## 2019-12-17 DIAGNOSIS — K219 Gastro-esophageal reflux disease without esophagitis: Secondary | ICD-10-CM | POA: Diagnosis not present

## 2019-12-19 ENCOUNTER — Ambulatory Visit: Payer: Medicare Other

## 2019-12-26 DIAGNOSIS — L501 Idiopathic urticaria: Secondary | ICD-10-CM | POA: Diagnosis not present

## 2019-12-28 IMAGING — CT CT ABD-PELV W/O CM
2 of 4 series · 15 of 46 positions shown, 17 images · non-contrast
Comparison: 06/03/2018

CLINICAL DATA: 61-year-old female with a history of abdominal pain
with nausea vomiting. Constipation.

EXAM:
CT ABDOMEN AND PELVIS WITHOUT CONTRAST
TECHNIQUE: Multidetector CT imaging of the abdomen and pelvis was performed
following the standard protocol without IV contrast.

[Series 3: abd/ pelvis 5.0 i30f 2 · axial · 0.93mm/px · z∈[+806,+1236]mm · 12 of 102 slices shown, 14 images]
[im 8/102  soft-tissue]
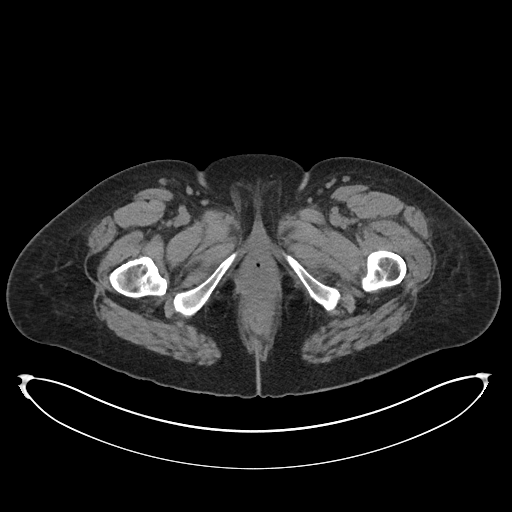
[im 8/102  bone]
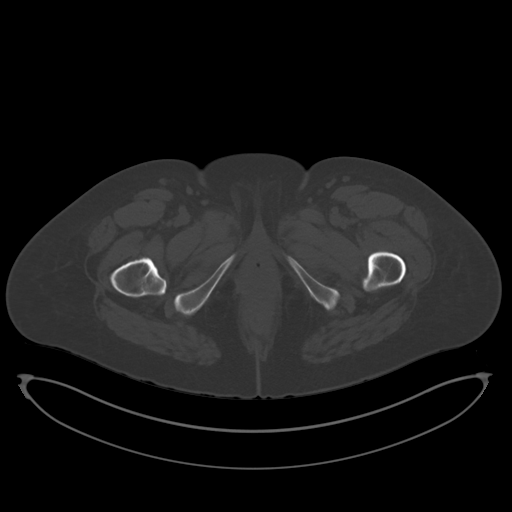
[im 16/102  soft-tissue]
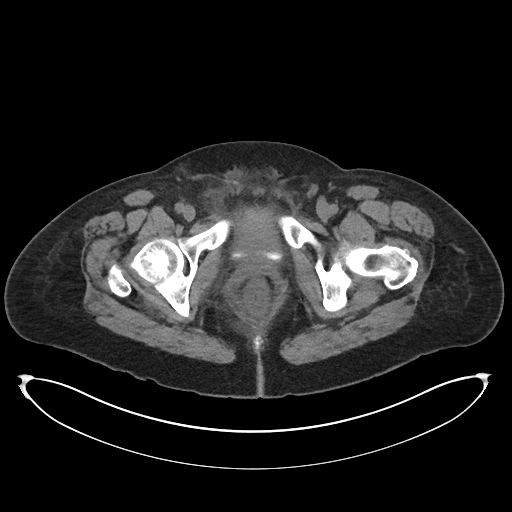
[im 24/102  soft-tissue]
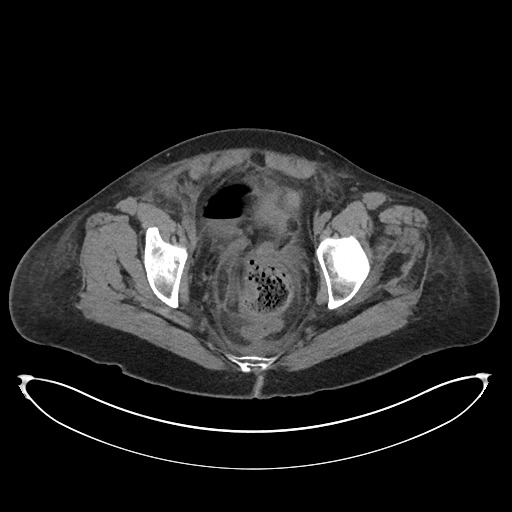
[im 32/102  soft-tissue]
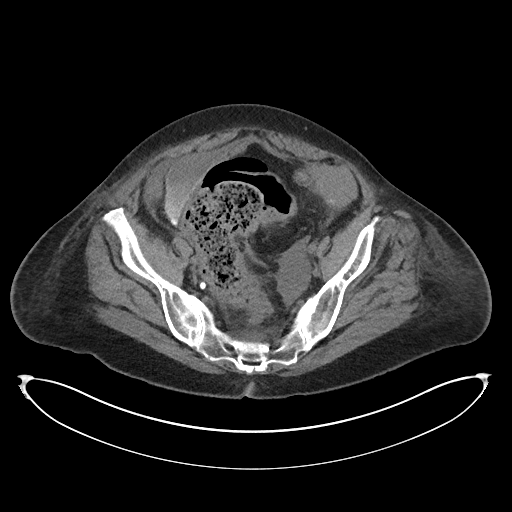
[im 39/102  soft-tissue]
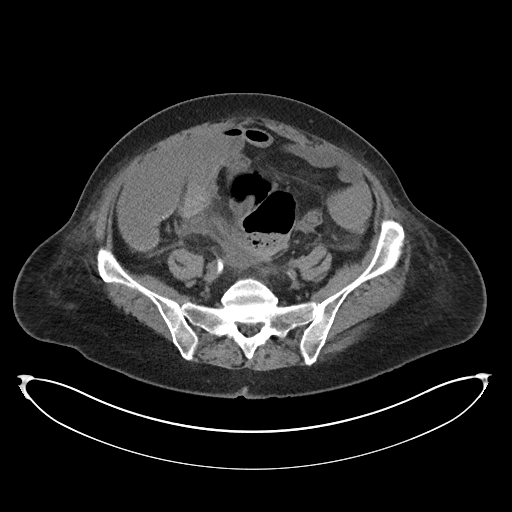
[im 47/102  soft-tissue]
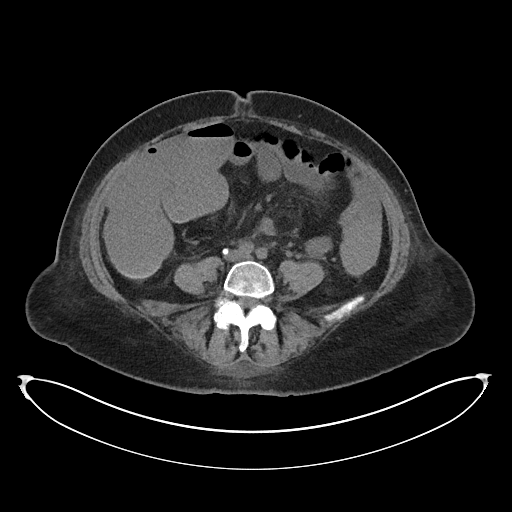
[im 55/102  soft-tissue]
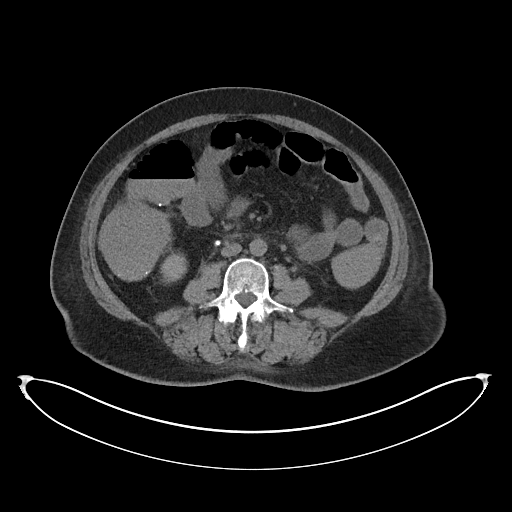
[im 63/102  soft-tissue]
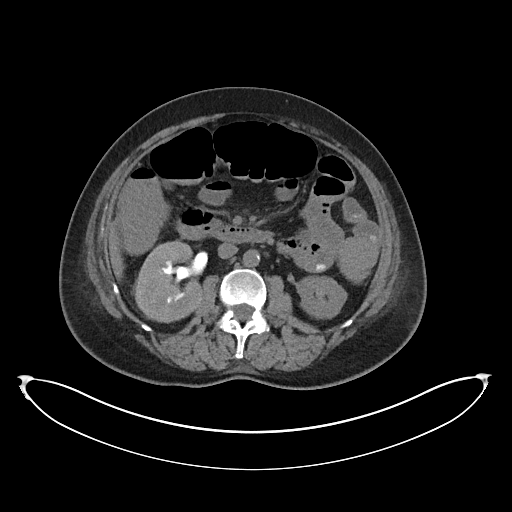
[im 70/102  soft-tissue]
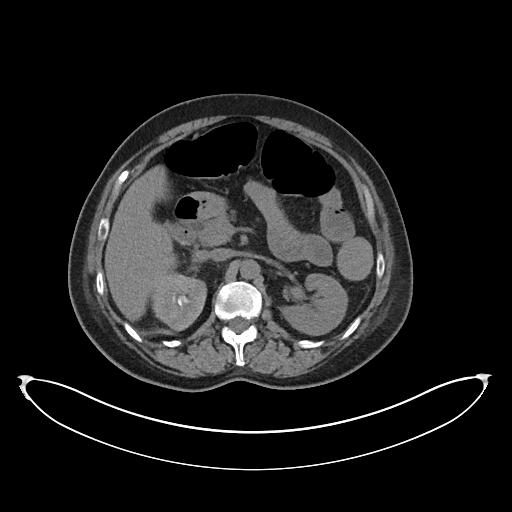
[im 70/102  bone]
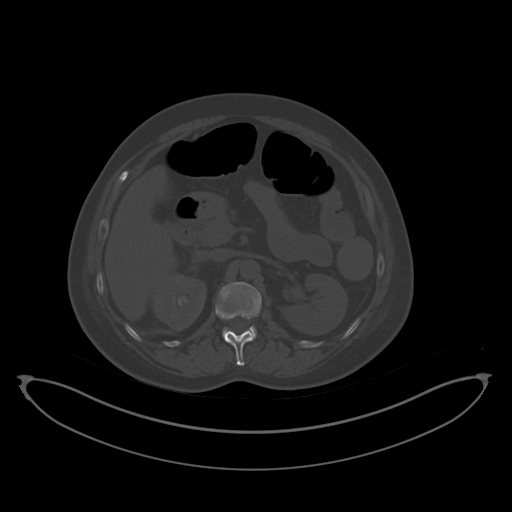
[im 78/102  soft-tissue]
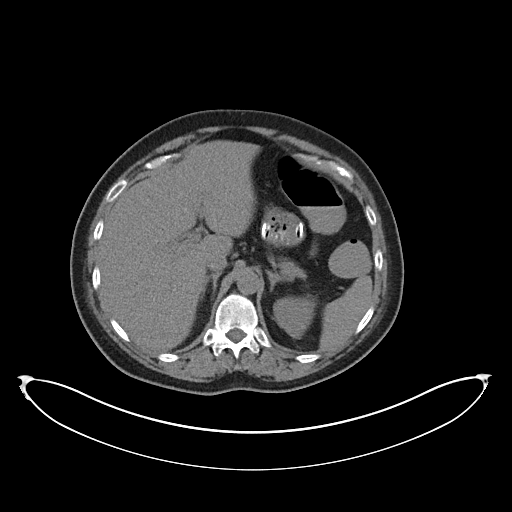
[im 86/102  soft-tissue]
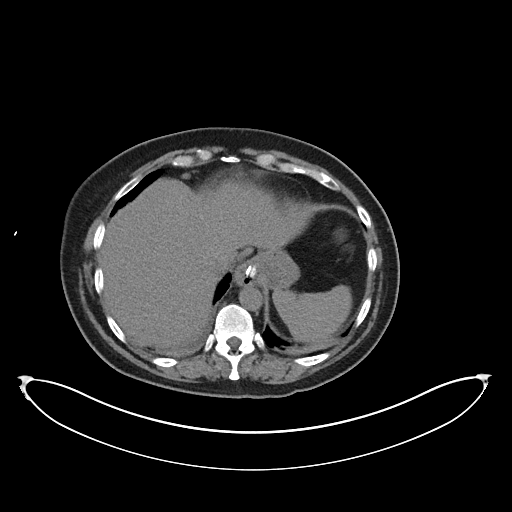
[im 94/102  soft-tissue]
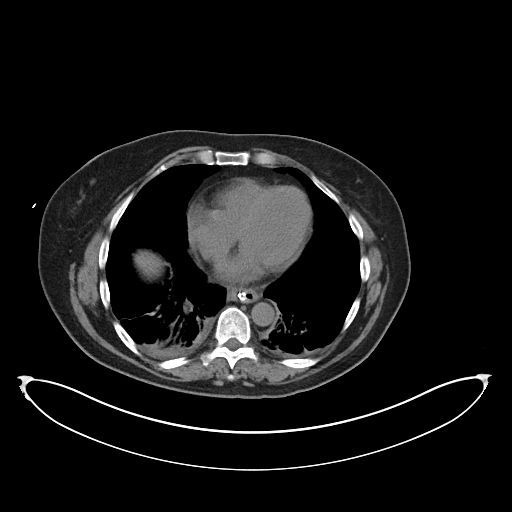

[Series 6: cor st · coronal · 0.85mm/px · 3 of 112 slices shown]
[im 38/112  soft-tissue]
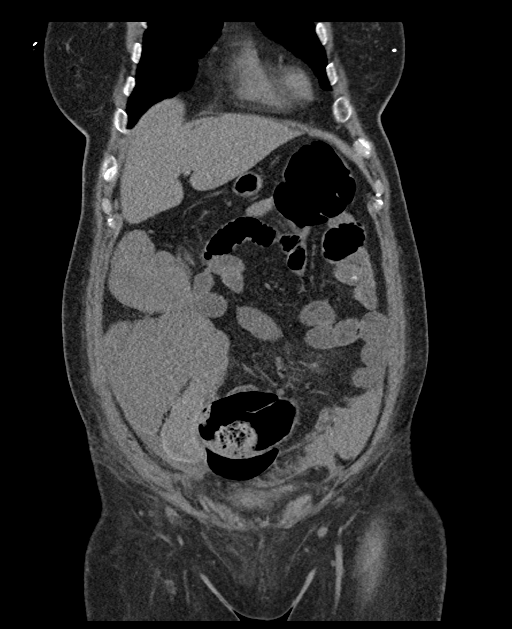
[im 50/112  soft-tissue]
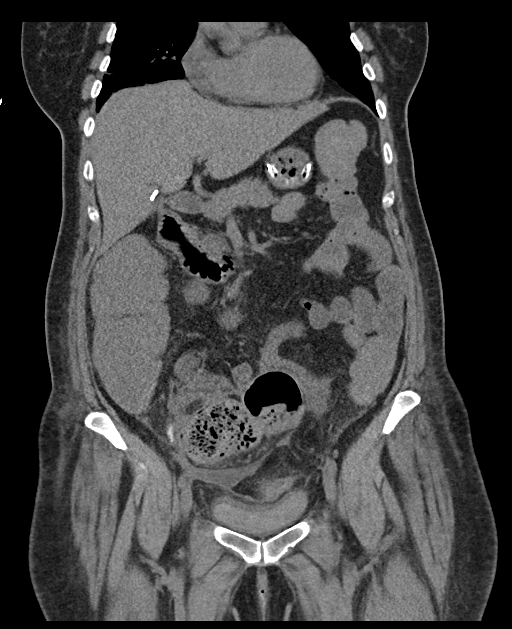
[im 62/112  soft-tissue]
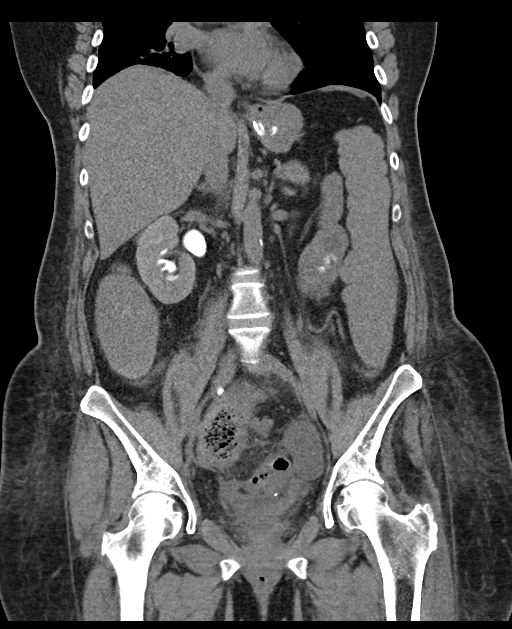

[15 of 46 positions shown; findings below may reference images not displayed]

FINDINGS: Lower chest: Trace atelectasis and pleural fluid at the bilateral
lung bases.

Hepatobiliary: Cranial caudal span of the right liver measures
cm. Surgical changes of cholecystectomy.

Pancreas: Unremarkable pancreas.

Spleen: Unremarkable spleen

Adrenals/Urinary Tract: Bilateral adrenal glands unremarkable.

There has been delayed excretion from the right kidney collecting
system with mild hydronephrosis. Contrast present through the length
of the proximal and mid segment of the right ureter. Left kidney
demonstrates near complete evacuation/excretion of contrast from the
collecting system and the left ureter.

Urinary bladder demonstrates retention of some contrast.

Stomach/Bowel: Gastric tube terminates within the stomach. Stomach
is decompressed.

Distended loops of small bowel with associated air-fluid levels.

Since the prior CT there has been significant fluid accumulation
throughout the length of the colon, with distention of cecum, right
colon, transverse colon and descending colon. There is similar
configuration of formed stool within the sigmoid colon, with a loop
of sigmoid colon and associated twisted mesentery of the low
abdomen/pelvis. The distal rectum is decompressed.

Crescent of air along the anterior aspect of the sigmoid colon on
image 70 favored to be within the lumen of bowel.

Vascular/Lymphatic: Mild atherosclerosis of the abdominal aorta.

No adenopathy. Small volume of free fluid within the left pelvis,
present on the comparison CT, likely reactive.

Reproductive: Hysterectomy

Other: Small fat containing umbilical hernia.

Musculoskeletal: No acute displaced fracture. Degenerative changes
of the spine.
IMPRESSION: CT demonstrates sigmoid volvulus with signs of worsening bowel
obstruction. Crescent of air at the anterior aspect of stool-filled
sigmoid colon I would favored to be within bowel lumen, as there are
no other signs of bowel perforation on the CT.

These results were discussed at the time of interpretation on
06/05/2018 at [DATE] with Dr. ELOAN TALBI.

Delayed excretion of contrast from the right collecting system,
potentially secondary to distal inflammation or compression of the
right ureter secondary to pelvic changes.

## 2019-12-28 IMAGING — DX DG ABDOMEN ACUTE W/ 1V CHEST
3 series · 3 of 3 positions shown · non-contrast
Comparison: Chest radiograph March 01, 2011; CT abdomen and pelvis
June 03, 2018

CLINICAL DATA: Constipation; abdominal pain

EXAM:
DG ABDOMEN ACUTE W/ 1V CHEST

[chest pa]
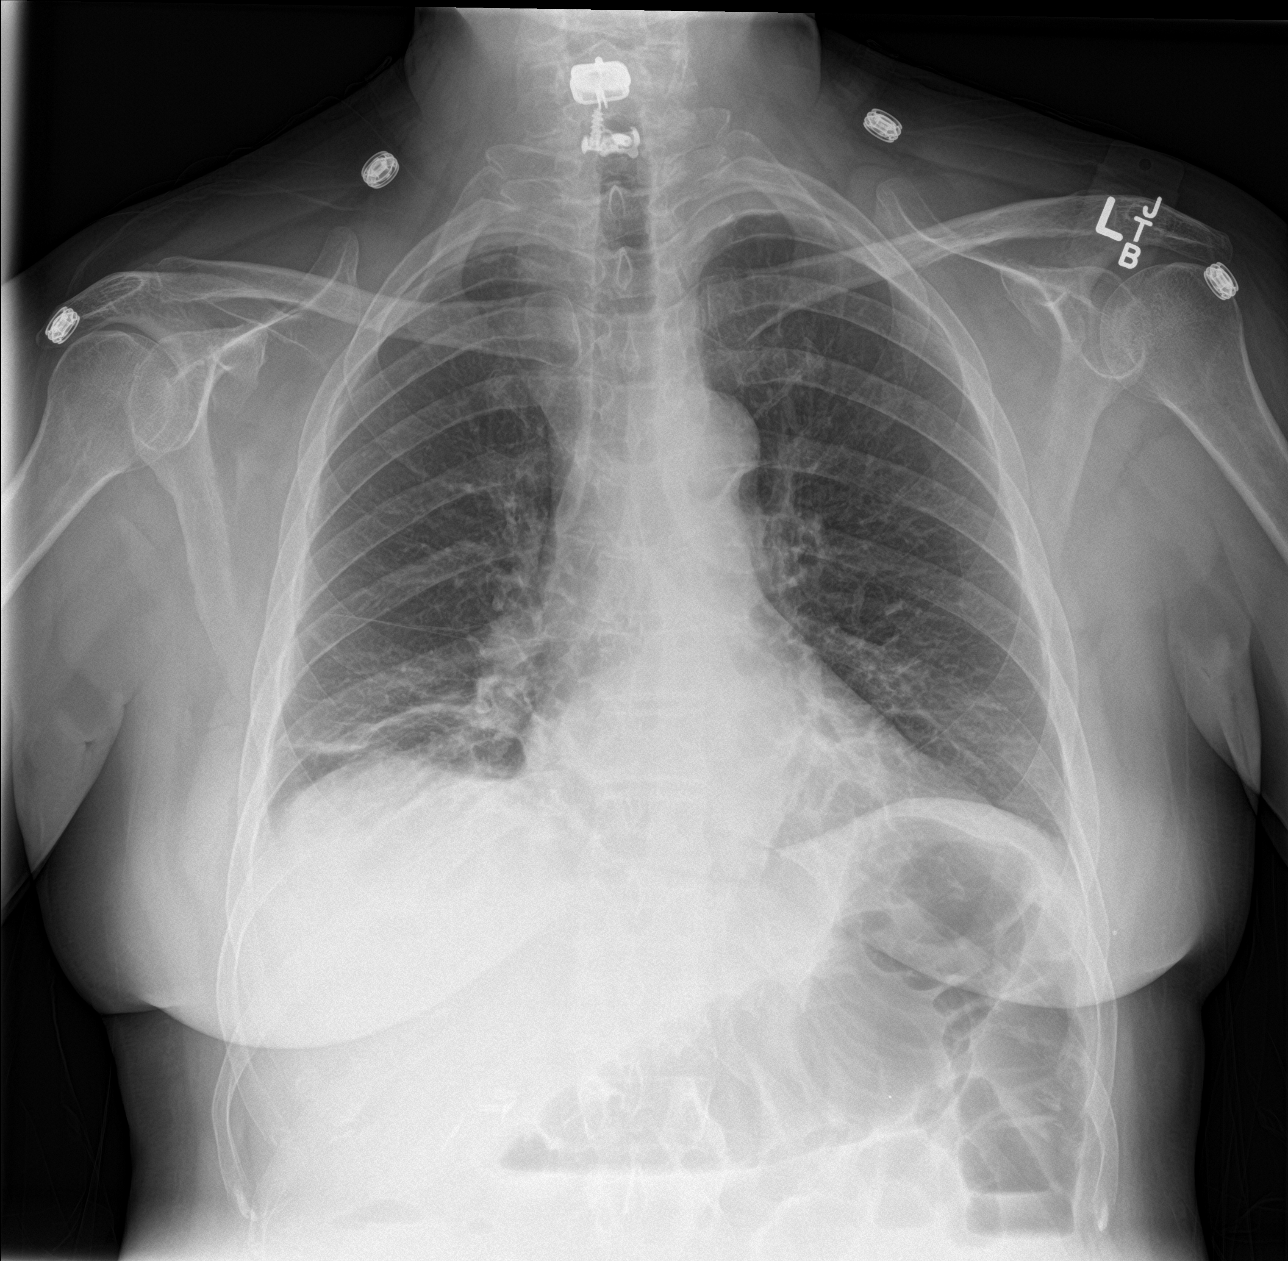

[abdomen erect]
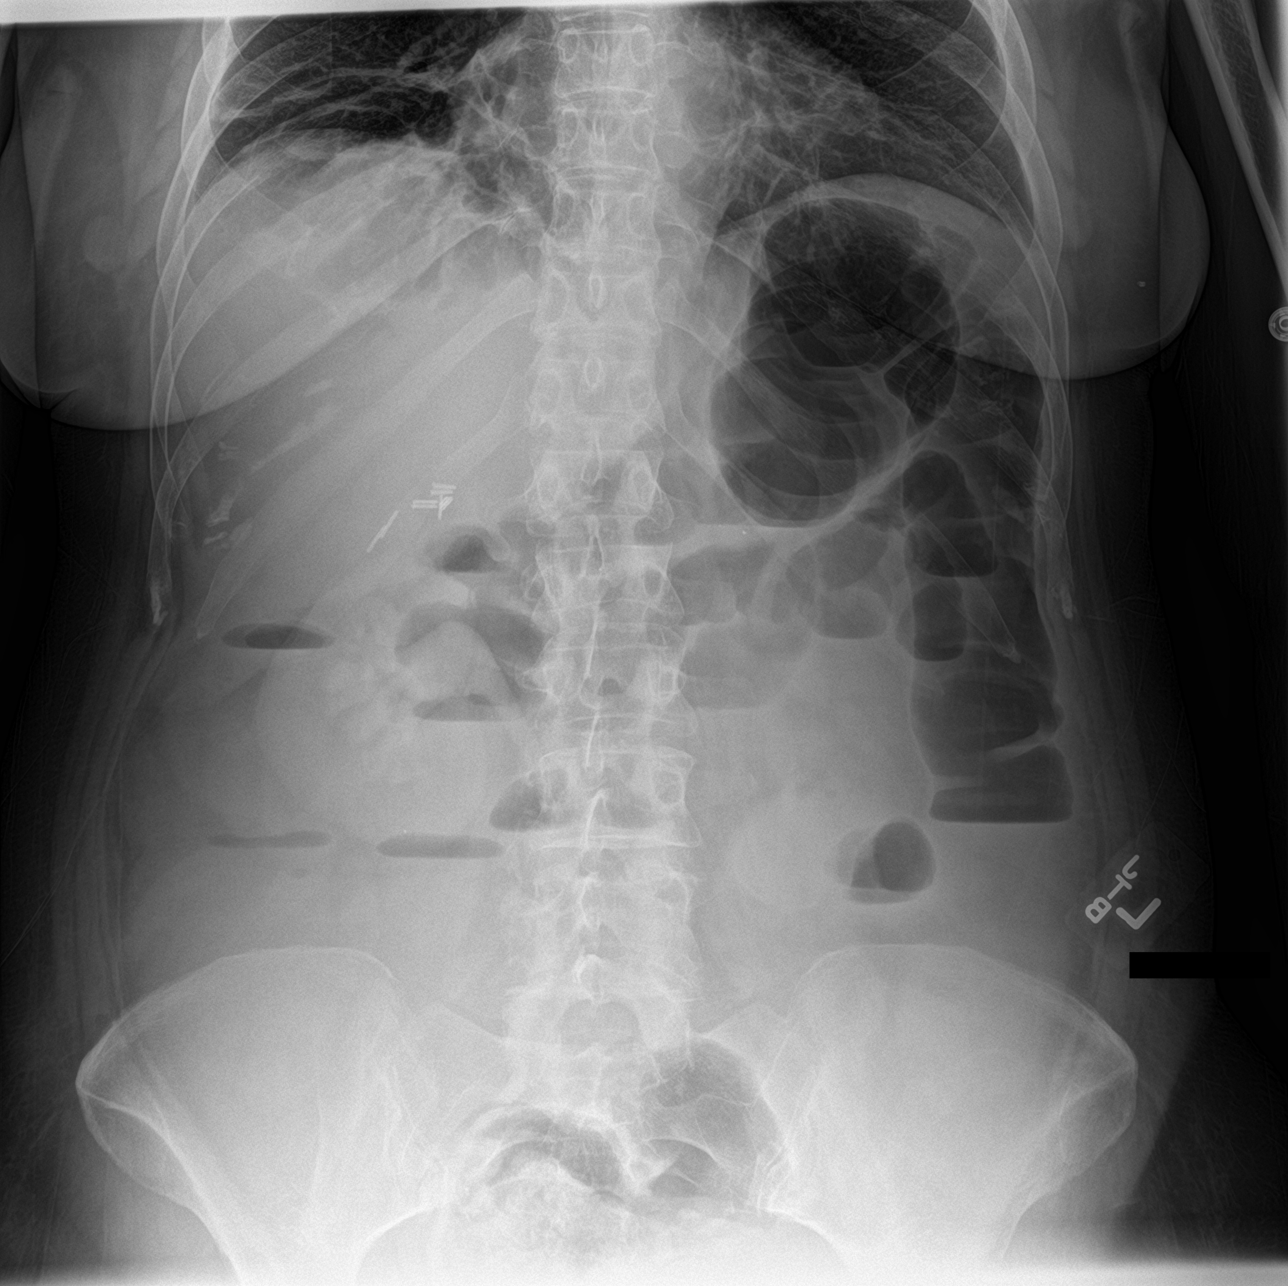

[abdomen supine]
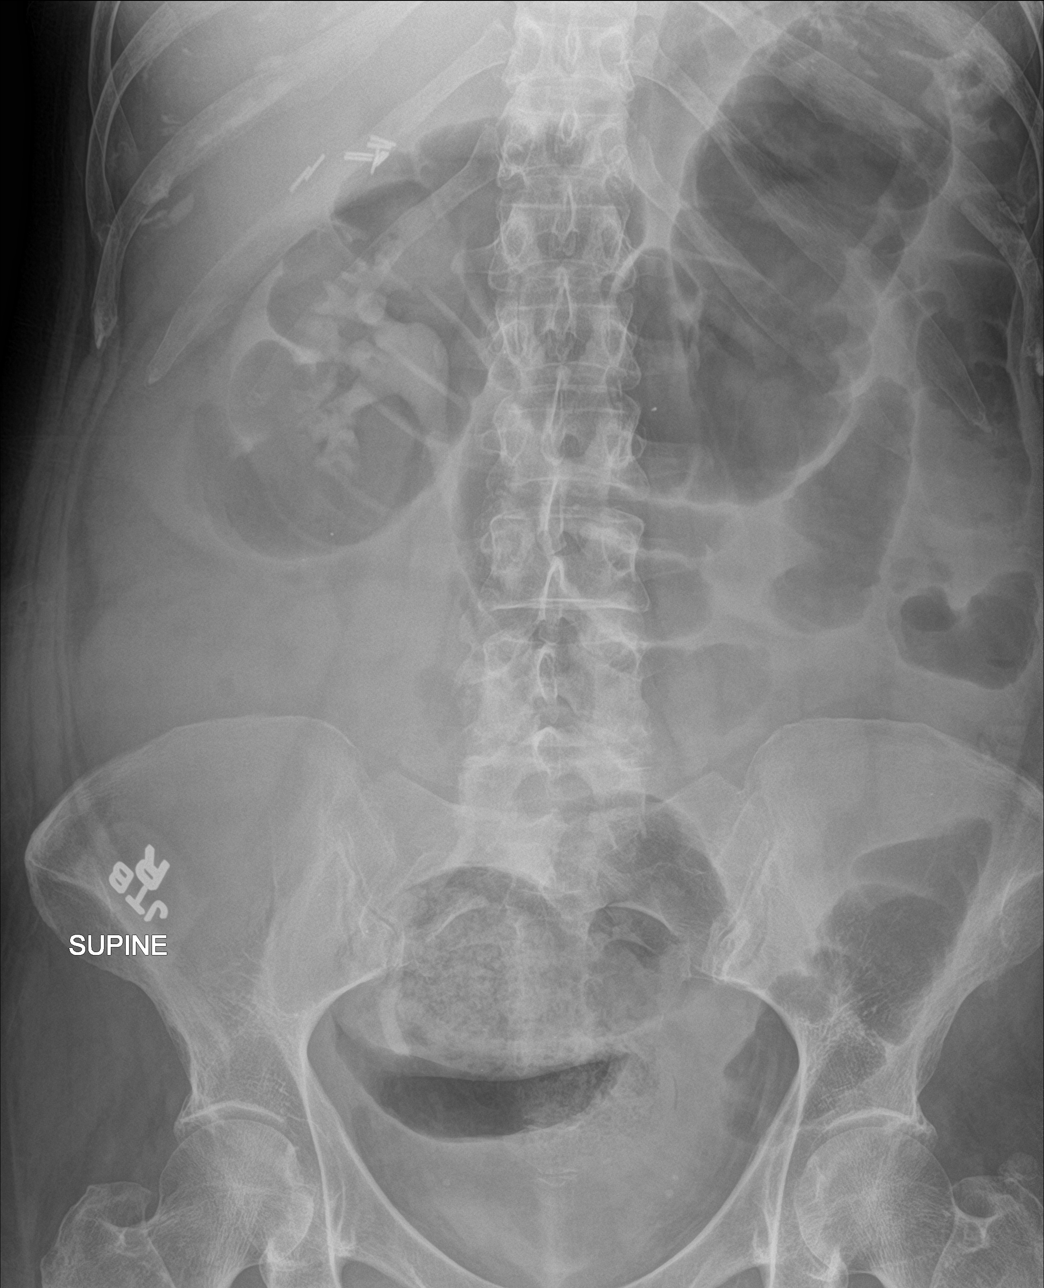

[3 of 3 positions shown; findings below may reference images not displayed]

FINDINGS: PA chest: There is atelectatic change in the right base. Lungs
elsewhere are clear. Heart size and pulmonary vascularity are
normal. No adenopathy.

Supine and upright abdomen: There is distension of the distal
sigmoid colon and upper rectum due to stool. There are loops of
dilated bowel with scattered air-fluid levels. No free air evident.
Surgical clips are noted in the right upper quadrant region. There
are phleboliths in the pelvis.
IMPRESSION: Bowel dilatation with scattered air-fluid levels. A degree of bowel
obstruction is felt to be present. Note that there are loops of both
small and large bowel dilatation with air-fluid levels in these
areas. Question functional bowel obstruction due to relative
obstruction of sigmoid colon due to stool. The appearance currently
is consistent with the findings suggesting stercoral colitis
distally on recent CT.

No free air evident.

Right base atelectasis.  Lungs elsewhere clear.

## 2019-12-28 IMAGING — DX DG ABD PORTABLE 1V
1 series · 1 of 1 positions shown · non-contrast
Comparison: Portable exam 2839 hours compared to 06/05/2018 at 1657
hours

CLINICAL DATA: Nasogastric tube position

EXAM:
PORTABLE ABDOMEN - 1 VIEW

[abdomen]
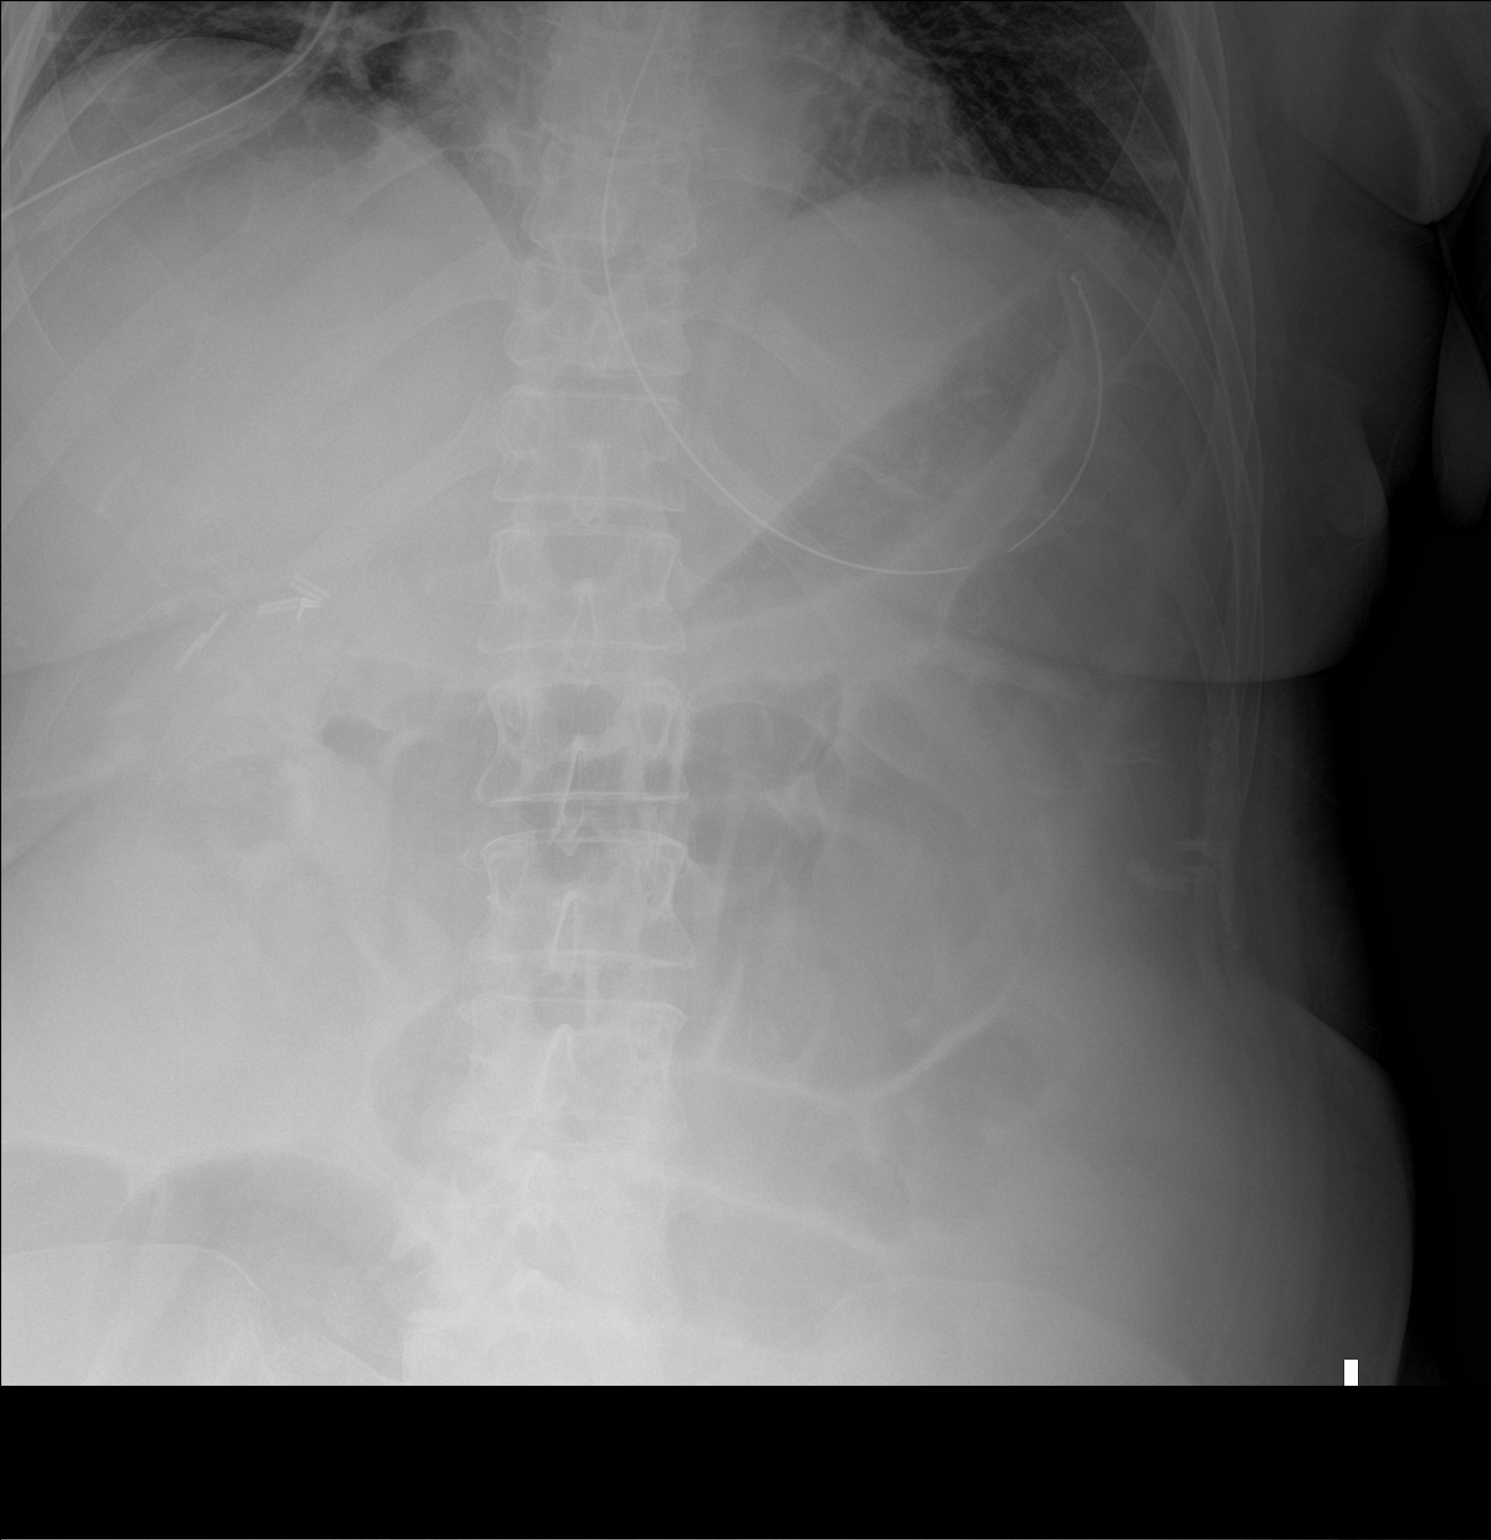

[1 of 1 positions shown; findings below may reference images not displayed]

FINDINGS: Nasogastric tube coiled in proximal stomach with tip near fundus.

Air-filled loops of large and small bowel again identified in the
upper abdomen.

Surgical clips RIGHT upper quadrant.

Bones demineralized.
IMPRESSION: Nasogastric tube coiled in proximal stomach.

## 2019-12-30 ENCOUNTER — Ambulatory Visit: Payer: Medicare Other | Attending: Internal Medicine

## 2019-12-30 DIAGNOSIS — Z23 Encounter for immunization: Secondary | ICD-10-CM | POA: Insufficient documentation

## 2019-12-30 NOTE — Progress Notes (Signed)
   Covid-19 Vaccination Clinic  Name:  [name]    MRN: [id number] DOB: [birth year]  12/30/2019  Ms. [name] was observed post Covid-19 immunization for 30 minutes without incident. She was provided with Vaccine Information Sheet and instruction to access the V-Safe system.   Ms. [name] was instructed to call 911 with any severe reactions post vaccine: Marland Kitchen Difficulty breathing  . Swelling of face and throat  . A fast heartbeat  . A bad rash all over body  . Dizziness and weakness   Immunizations Administered    Name Date Dose VIS Date Route   Pfizer COVID-19 Vaccine 12/30/2019 11:01 AM 0.3 mL 10/10/2019 Intramuscular   Manufacturer: [place]   Lot: [id number]   [place]: [id number]

## 2020-01-01 ENCOUNTER — Other Ambulatory Visit: Payer: Self-pay | Admitting: Cardiology

## 2020-01-20 DIAGNOSIS — L719 Rosacea, unspecified: Principal | ICD-10-CM

## 2020-01-20 DIAGNOSIS — M318 Other specified necrotizing vasculopathies: Principal | ICD-10-CM

## 2020-01-20 MED ORDER — HYDROXYCHLOROQUINE 200 MG TABLET
ORAL_TABLET | 0 refills | 0.00000 days | Status: CP
Start: 2020-01-20 — End: ?

## 2020-01-20 MED ORDER — DOXYCYCLINE HYCLATE 20 MG TABLET
ORAL_TABLET | ORAL | 0 refills | 0.00000 days | Status: CP
Start: 2020-01-20 — End: ?

## 2020-01-23 DIAGNOSIS — L501 Idiopathic urticaria: Secondary | ICD-10-CM | POA: Diagnosis not present

## 2020-02-16 ENCOUNTER — Ambulatory Visit
Admit: 2020-02-16 | Discharge: 2020-02-17 | Payer: MEDICARE | Attending: Student in an Organized Health Care Education/Training Program | Primary: Student in an Organized Health Care Education/Training Program

## 2020-02-16 DIAGNOSIS — L700 Acne vulgaris: Principal | ICD-10-CM

## 2020-02-16 DIAGNOSIS — L509 Urticaria, unspecified: Principal | ICD-10-CM

## 2020-02-16 DIAGNOSIS — L719 Rosacea, unspecified: Principal | ICD-10-CM

## 2020-02-16 DIAGNOSIS — M318 Other specified necrotizing vasculopathies: Principal | ICD-10-CM

## 2020-02-16 DIAGNOSIS — L508 Other urticaria: Secondary | ICD-10-CM | POA: Diagnosis not present

## 2020-02-16 MED ORDER — METRONIDAZOLE 0.75 % TOPICAL GEL
Freq: Two times a day (BID) | TOPICAL | 2 refills | 0.00000 days | Status: CP
Start: 2020-02-16 — End: ?

## 2020-02-16 MED ORDER — DOXYCYCLINE HYCLATE 20 MG TABLET
ORAL_TABLET | Freq: Two times a day (BID) | ORAL | 2 refills | 30 days | Status: CP
Start: 2020-02-16 — End: ?

## 2020-02-19 DIAGNOSIS — S39011A Strain of muscle, fascia and tendon of abdomen, initial encounter: Secondary | ICD-10-CM | POA: Diagnosis not present

## 2020-02-23 ENCOUNTER — Telehealth: Payer: Self-pay | Admitting: Neurology

## 2020-02-23 NOTE — Telephone Encounter (Signed)
I called pt about receiving a bill from labs done in 07/2019 in the amount of 700 dollars. She stated in 05-Dec-2019 another nurse had spoken with her to call [facility] to give other codes to get medicare to pay for the claim. I stated Sharyn Lull RN did call [facility] in 12/04/22 and was told they would resubmit the claim that could take 30 to 45 days to process. The pt has a bill but does not know if its the old bill or revise bill with adjustments from the codes in 05-Dec-2019 that were given. Pt stated medicare stated she was told if they were medically necessary it would get approve. Pt will call medicare to find out the updated status of the claim. She will call us back for any concerns or questions.

## 2020-02-23 NOTE — Telephone Encounter (Signed)
Requesting a call from RN stating she has more questions regarding the [facility] codes

## 2020-02-27 DIAGNOSIS — L501 Idiopathic urticaria: Secondary | ICD-10-CM | POA: Diagnosis not present

## 2020-03-02 DIAGNOSIS — Z1159 Encounter for screening for other viral diseases: Secondary | ICD-10-CM | POA: Diagnosis not present

## 2020-03-02 DIAGNOSIS — R635 Abnormal weight gain: Secondary | ICD-10-CM | POA: Diagnosis not present

## 2020-03-02 DIAGNOSIS — E559 Vitamin D deficiency, unspecified: Secondary | ICD-10-CM | POA: Diagnosis not present

## 2020-03-02 DIAGNOSIS — Z0000 Encounter for general adult medical examination without abnormal findings: Secondary | ICD-10-CM | POA: Diagnosis not present

## 2020-03-02 DIAGNOSIS — F3341 Major depressive disorder, recurrent, in partial remission: Secondary | ICD-10-CM | POA: Diagnosis not present

## 2020-03-02 DIAGNOSIS — Z6831 Body mass index (BMI) 31.0-31.9, adult: Secondary | ICD-10-CM | POA: Diagnosis not present

## 2020-03-02 DIAGNOSIS — E782 Mixed hyperlipidemia: Secondary | ICD-10-CM | POA: Diagnosis not present

## 2020-03-02 DIAGNOSIS — J45998 Other asthma: Secondary | ICD-10-CM | POA: Diagnosis not present

## 2020-03-02 DIAGNOSIS — Z23 Encounter for immunization: Secondary | ICD-10-CM | POA: Diagnosis not present

## 2020-03-02 DIAGNOSIS — G629 Polyneuropathy, unspecified: Secondary | ICD-10-CM | POA: Diagnosis not present

## 2020-03-02 DIAGNOSIS — N183 Chronic kidney disease, stage 3 unspecified: Secondary | ICD-10-CM | POA: Diagnosis not present

## 2020-03-02 DIAGNOSIS — R5382 Chronic fatigue, unspecified: Secondary | ICD-10-CM | POA: Diagnosis not present

## 2020-03-09 DIAGNOSIS — E039 Hypothyroidism, unspecified: Secondary | ICD-10-CM | POA: Diagnosis not present

## 2020-03-12 DIAGNOSIS — Z780 Asymptomatic menopausal state: Secondary | ICD-10-CM | POA: Diagnosis not present

## 2020-03-12 DIAGNOSIS — M85852 Other specified disorders of bone density and structure, left thigh: Secondary | ICD-10-CM | POA: Diagnosis not present

## 2020-03-12 DIAGNOSIS — M85851 Other specified disorders of bone density and structure, right thigh: Secondary | ICD-10-CM | POA: Diagnosis not present

## 2020-03-16 DIAGNOSIS — E039 Hypothyroidism, unspecified: Secondary | ICD-10-CM | POA: Diagnosis not present

## 2020-03-16 DIAGNOSIS — E063 Autoimmune thyroiditis: Secondary | ICD-10-CM | POA: Diagnosis not present

## 2020-03-26 DIAGNOSIS — L501 Idiopathic urticaria: Secondary | ICD-10-CM | POA: Diagnosis not present

## 2020-03-26 DIAGNOSIS — J4540 Moderate persistent asthma, uncomplicated: Secondary | ICD-10-CM | POA: Diagnosis not present

## 2020-03-31 DIAGNOSIS — G4733 Obstructive sleep apnea (adult) (pediatric): Secondary | ICD-10-CM | POA: Diagnosis not present

## 2020-04-20 DIAGNOSIS — J4540 Moderate persistent asthma, uncomplicated: Secondary | ICD-10-CM | POA: Diagnosis not present

## 2020-04-20 DIAGNOSIS — F325 Major depressive disorder, single episode, in full remission: Secondary | ICD-10-CM | POA: Diagnosis not present

## 2020-04-20 DIAGNOSIS — E782 Mixed hyperlipidemia: Secondary | ICD-10-CM | POA: Diagnosis not present

## 2020-04-20 DIAGNOSIS — Z8542 Personal history of malignant neoplasm of other parts of uterus: Secondary | ICD-10-CM | POA: Diagnosis not present

## 2020-04-20 DIAGNOSIS — N183 Chronic kidney disease, stage 3 unspecified: Secondary | ICD-10-CM | POA: Diagnosis not present

## 2020-04-20 DIAGNOSIS — E039 Hypothyroidism, unspecified: Secondary | ICD-10-CM | POA: Diagnosis not present

## 2020-04-20 DIAGNOSIS — F3341 Major depressive disorder, recurrent, in partial remission: Secondary | ICD-10-CM | POA: Diagnosis not present

## 2020-04-20 DIAGNOSIS — J45998 Other asthma: Secondary | ICD-10-CM | POA: Diagnosis not present

## 2020-04-21 DIAGNOSIS — M318 Other specified necrotizing vasculopathies: Principal | ICD-10-CM

## 2020-04-21 MED ORDER — HYDROXYCHLOROQUINE 200 MG TABLET
ORAL_TABLET | 3 refills | 0 days | Status: CP
Start: 2020-04-21 — End: ?

## 2020-05-18 DIAGNOSIS — E039 Hypothyroidism, unspecified: Secondary | ICD-10-CM | POA: Diagnosis not present

## 2020-05-24 DIAGNOSIS — L501 Idiopathic urticaria: Secondary | ICD-10-CM | POA: Diagnosis not present

## 2020-05-27 DIAGNOSIS — N951 Menopausal and female climacteric states: Secondary | ICD-10-CM | POA: Diagnosis not present

## 2020-05-27 DIAGNOSIS — Z01419 Encounter for gynecological examination (general) (routine) without abnormal findings: Secondary | ICD-10-CM | POA: Diagnosis not present

## 2020-05-27 DIAGNOSIS — F419 Anxiety disorder, unspecified: Secondary | ICD-10-CM | POA: Diagnosis not present

## 2020-05-27 DIAGNOSIS — Z6831 Body mass index (BMI) 31.0-31.9, adult: Secondary | ICD-10-CM | POA: Diagnosis not present

## 2020-05-27 DIAGNOSIS — R399 Unspecified symptoms and signs involving the genitourinary system: Secondary | ICD-10-CM | POA: Diagnosis not present

## 2020-06-11 ENCOUNTER — Other Ambulatory Visit: Payer: Self-pay | Admitting: Cardiology

## 2020-06-16 ENCOUNTER — Other Ambulatory Visit: Payer: Self-pay | Admitting: Obstetrics & Gynecology

## 2020-06-16 DIAGNOSIS — Z01419 Encounter for gynecological examination (general) (routine) without abnormal findings: Secondary | ICD-10-CM | POA: Diagnosis not present

## 2020-06-16 DIAGNOSIS — M793 Panniculitis, unspecified: Secondary | ICD-10-CM | POA: Diagnosis not present

## 2020-06-16 DIAGNOSIS — N9089 Other specified noninflammatory disorders of vulva and perineum: Secondary | ICD-10-CM | POA: Diagnosis not present

## 2020-06-21 DIAGNOSIS — L501 Idiopathic urticaria: Secondary | ICD-10-CM | POA: Diagnosis not present

## 2020-06-29 DIAGNOSIS — F325 Major depressive disorder, single episode, in full remission: Secondary | ICD-10-CM | POA: Diagnosis not present

## 2020-06-29 DIAGNOSIS — E039 Hypothyroidism, unspecified: Secondary | ICD-10-CM | POA: Diagnosis not present

## 2020-06-29 DIAGNOSIS — F3341 Major depressive disorder, recurrent, in partial remission: Secondary | ICD-10-CM | POA: Diagnosis not present

## 2020-06-29 DIAGNOSIS — E782 Mixed hyperlipidemia: Secondary | ICD-10-CM | POA: Diagnosis not present

## 2020-06-29 DIAGNOSIS — N183 Chronic kidney disease, stage 3 unspecified: Secondary | ICD-10-CM | POA: Diagnosis not present

## 2020-06-29 DIAGNOSIS — J45998 Other asthma: Secondary | ICD-10-CM | POA: Diagnosis not present

## 2020-06-29 DIAGNOSIS — J4540 Moderate persistent asthma, uncomplicated: Secondary | ICD-10-CM | POA: Diagnosis not present

## 2020-06-29 DIAGNOSIS — Z8542 Personal history of malignant neoplasm of other parts of uterus: Secondary | ICD-10-CM | POA: Diagnosis not present

## 2020-06-30 DIAGNOSIS — M542 Cervicalgia: Secondary | ICD-10-CM | POA: Diagnosis not present

## 2020-06-30 DIAGNOSIS — Z01419 Encounter for gynecological examination (general) (routine) without abnormal findings: Secondary | ICD-10-CM | POA: Diagnosis not present

## 2020-07-02 DIAGNOSIS — M5481 Occipital neuralgia: Secondary | ICD-10-CM | POA: Insufficient documentation

## 2020-07-08 DIAGNOSIS — Z23 Encounter for immunization: Secondary | ICD-10-CM | POA: Diagnosis not present

## 2020-07-09 DIAGNOSIS — E039 Hypothyroidism, unspecified: Secondary | ICD-10-CM | POA: Diagnosis not present

## 2020-07-09 DIAGNOSIS — Z8542 Personal history of malignant neoplasm of other parts of uterus: Secondary | ICD-10-CM | POA: Diagnosis not present

## 2020-07-09 DIAGNOSIS — F3341 Major depressive disorder, recurrent, in partial remission: Secondary | ICD-10-CM | POA: Diagnosis not present

## 2020-07-09 DIAGNOSIS — J4540 Moderate persistent asthma, uncomplicated: Secondary | ICD-10-CM | POA: Diagnosis not present

## 2020-07-09 DIAGNOSIS — N183 Chronic kidney disease, stage 3 unspecified: Secondary | ICD-10-CM | POA: Diagnosis not present

## 2020-07-09 DIAGNOSIS — J45998 Other asthma: Secondary | ICD-10-CM | POA: Diagnosis not present

## 2020-07-09 DIAGNOSIS — E782 Mixed hyperlipidemia: Secondary | ICD-10-CM | POA: Diagnosis not present

## 2020-07-09 DIAGNOSIS — F325 Major depressive disorder, single episode, in full remission: Secondary | ICD-10-CM | POA: Diagnosis not present

## 2020-07-16 DIAGNOSIS — Z79899 Other long term (current) drug therapy: Secondary | ICD-10-CM | POA: Diagnosis not present

## 2020-07-17 DIAGNOSIS — M5481 Occipital neuralgia: Secondary | ICD-10-CM | POA: Diagnosis not present

## 2020-07-20 DIAGNOSIS — L501 Idiopathic urticaria: Secondary | ICD-10-CM | POA: Diagnosis not present

## 2020-07-27 ENCOUNTER — Other Ambulatory Visit: Payer: Self-pay

## 2020-07-27 ENCOUNTER — Encounter: Payer: Self-pay | Admitting: Physician Assistant

## 2020-07-27 ENCOUNTER — Ambulatory Visit (INDEPENDENT_AMBULATORY_CARE_PROVIDER_SITE_OTHER): Payer: Medicare Other | Admitting: Physician Assistant

## 2020-07-27 DIAGNOSIS — D485 Neoplasm of uncertain behavior of skin: Secondary | ICD-10-CM

## 2020-07-27 MED ORDER — ALCLOMETASONE DIPROPIONATE 0.05 % EX CREA: TOPICAL_CREAM | Freq: Two times a day (BID) | CUTANEOUS | 3 refills | Status: DC | PRN

## 2020-07-28 NOTE — Progress Notes (Addendum)
   Follow-Up Visit   Subjective  JULIETH TUGMAN is a 64 y.o. female who presents for the following: Skin Problem (X YEARS FACE LESIONS).   The following portions of the chart were reviewed this encounter and updated as appropriate: Tobacco  Allergies  Meds  Problems  Med Hx  Surg Hx  Fam Hx      Objective  Well appearing patient in no apparent distress; mood and affect are within normal limits.  A focused examination was performed including face. Relevant physical exam findings are noted in the Assessment and Plan.  Objective  Left Buccal Cheek  (3): Excoriated nodules  Assessment & Plan  Neoplasm of uncertain behavior of skin (3) Left Buccal Cheek  (3)  Destruction of lesion Complexity: simple   Destruction method: electrodessication on 1 setting x 10 Informed consent: discussed and consent obtained   Timeout:  patient name, date of birth, surgical site, and procedure verified   Curettage performed in three different directions: No     Electrodesiccation performed over the curetted area: No   Outcome: patient tolerated procedure well with no complications   Post-procedure details: wound care instructions given      I, Pearlene Teat, PA-C, have reviewed all documentation's for this visit.  The documentation on 09/21/20 for the exam, diagnosis, procedures and orders are all accurate and complete.

## 2020-08-04 DIAGNOSIS — K219 Gastro-esophageal reflux disease without esophagitis: Secondary | ICD-10-CM | POA: Diagnosis not present

## 2020-08-04 DIAGNOSIS — K5900 Constipation, unspecified: Secondary | ICD-10-CM | POA: Diagnosis not present

## 2020-08-04 DIAGNOSIS — Z9049 Acquired absence of other specified parts of digestive tract: Secondary | ICD-10-CM | POA: Diagnosis not present

## 2020-08-04 DIAGNOSIS — M542 Cervicalgia: Secondary | ICD-10-CM | POA: Diagnosis not present

## 2020-08-06 ENCOUNTER — Ambulatory Visit: Admit: 2020-08-06 | Discharge: 2020-08-07 | Payer: MEDICARE

## 2020-08-06 DIAGNOSIS — L281 Prurigo nodularis: Principal | ICD-10-CM

## 2020-08-06 DIAGNOSIS — L700 Acne vulgaris: Principal | ICD-10-CM

## 2020-08-06 DIAGNOSIS — L719 Rosacea, unspecified: Principal | ICD-10-CM

## 2020-08-06 DIAGNOSIS — L509 Urticaria, unspecified: Principal | ICD-10-CM

## 2020-08-06 DIAGNOSIS — Z79899 Other long term (current) drug therapy: Principal | ICD-10-CM

## 2020-08-06 DIAGNOSIS — L958 Other vasculitis limited to the skin: Principal | ICD-10-CM

## 2020-08-09 DIAGNOSIS — M542 Cervicalgia: Secondary | ICD-10-CM | POA: Diagnosis not present

## 2020-08-12 DIAGNOSIS — M542 Cervicalgia: Secondary | ICD-10-CM | POA: Diagnosis not present

## 2020-08-13 DIAGNOSIS — Z23 Encounter for immunization: Secondary | ICD-10-CM | POA: Diagnosis not present

## 2020-08-16 DIAGNOSIS — J3089 Other allergic rhinitis: Secondary | ICD-10-CM | POA: Diagnosis not present

## 2020-08-16 DIAGNOSIS — J4540 Moderate persistent asthma, uncomplicated: Secondary | ICD-10-CM | POA: Diagnosis not present

## 2020-08-16 DIAGNOSIS — Z91010 Allergy to peanuts: Secondary | ICD-10-CM | POA: Diagnosis not present

## 2020-08-16 DIAGNOSIS — J3081 Allergic rhinitis due to animal (cat) (dog) hair and dander: Secondary | ICD-10-CM | POA: Diagnosis not present

## 2020-08-16 DIAGNOSIS — L501 Idiopathic urticaria: Secondary | ICD-10-CM | POA: Diagnosis not present

## 2020-08-26 DIAGNOSIS — Z8542 Personal history of malignant neoplasm of other parts of uterus: Secondary | ICD-10-CM | POA: Diagnosis not present

## 2020-08-26 DIAGNOSIS — F3341 Major depressive disorder, recurrent, in partial remission: Secondary | ICD-10-CM | POA: Diagnosis not present

## 2020-08-26 DIAGNOSIS — E039 Hypothyroidism, unspecified: Secondary | ICD-10-CM | POA: Diagnosis not present

## 2020-08-26 DIAGNOSIS — M542 Cervicalgia: Secondary | ICD-10-CM | POA: Diagnosis not present

## 2020-08-26 DIAGNOSIS — J4540 Moderate persistent asthma, uncomplicated: Secondary | ICD-10-CM | POA: Diagnosis not present

## 2020-08-26 DIAGNOSIS — E782 Mixed hyperlipidemia: Secondary | ICD-10-CM | POA: Diagnosis not present

## 2020-08-26 DIAGNOSIS — F325 Major depressive disorder, single episode, in full remission: Secondary | ICD-10-CM | POA: Diagnosis not present

## 2020-08-26 DIAGNOSIS — J45998 Other asthma: Secondary | ICD-10-CM | POA: Diagnosis not present

## 2020-08-26 DIAGNOSIS — N183 Chronic kidney disease, stage 3 unspecified: Secondary | ICD-10-CM | POA: Diagnosis not present

## 2020-08-27 ENCOUNTER — Other Ambulatory Visit: Payer: Self-pay | Admitting: Cardiology

## 2020-09-02 DIAGNOSIS — M542 Cervicalgia: Secondary | ICD-10-CM | POA: Diagnosis not present

## 2020-09-03 ENCOUNTER — Ambulatory Visit: Payer: Medicare Other | Admitting: Cardiology

## 2020-09-03 DIAGNOSIS — R1310 Dysphagia, unspecified: Secondary | ICD-10-CM | POA: Diagnosis not present

## 2020-09-03 DIAGNOSIS — K2100 Gastro-esophageal reflux disease with esophagitis, without bleeding: Secondary | ICD-10-CM | POA: Diagnosis not present

## 2020-09-06 ENCOUNTER — Other Ambulatory Visit (HOSPITAL_COMMUNITY): Payer: Self-pay | Admitting: Physician Assistant

## 2020-09-06 ENCOUNTER — Other Ambulatory Visit: Payer: Self-pay | Admitting: Physician Assistant

## 2020-09-06 DIAGNOSIS — R1310 Dysphagia, unspecified: Secondary | ICD-10-CM

## 2020-09-07 ENCOUNTER — Encounter: Payer: Self-pay | Admitting: Cardiology

## 2020-09-07 ENCOUNTER — Other Ambulatory Visit: Payer: Self-pay

## 2020-09-07 ENCOUNTER — Ambulatory Visit (INDEPENDENT_AMBULATORY_CARE_PROVIDER_SITE_OTHER): Payer: Medicare Other | Admitting: Cardiology

## 2020-09-07 VITALS — BP 120/70 | HR 73 | Ht 65.0 in | Wt 189.2 lb

## 2020-09-07 DIAGNOSIS — Q211 Atrial septal defect: Secondary | ICD-10-CM | POA: Diagnosis not present

## 2020-09-07 DIAGNOSIS — I471 Supraventricular tachycardia: Secondary | ICD-10-CM | POA: Diagnosis not present

## 2020-09-07 DIAGNOSIS — E7800 Pure hypercholesterolemia, unspecified: Secondary | ICD-10-CM

## 2020-09-07 DIAGNOSIS — Q2112 Patent foramen ovale: Secondary | ICD-10-CM

## 2020-09-07 NOTE — Progress Notes (Signed)
Cardiology Office Note:    Date:  09/07/2020   ID:  [name], DOB 1956-07-12, MRN [id number]  PCP:  Kathyrn Lass, MD  [facility] Cardiologist:  Candee Furbish, MD  [facility] Electrophysiologist:  None   Referring MD: Kathyrn Lass, MD     History of Present Illness:    [name] is a 64 y.o. female here for follow-up of paroxysmal atrial tachycardia well-controlled on metoprolol.  She had cardiac catheterization.  Did well.  Minimal soft plaque bilateral carotid arteries as well.  Had a sigmoid volvulus Hartman's pouch for 5 months.  On aspirin for PFO.  Past Medical History:  Diagnosis Date  . Asthma   . Back pain   . Chronic fatigue syndrome    Dr. Sabra Heck  . Complication of anesthesia   . Cough    /SOB, PFT's nl, improved with Bronchodilation 8/11  . Dysrhythmia   . Esophageal spasm    GERD Dr. Sabra Heck, Dr. Wynetta Emery; egd 2006 nl; UGI 3/13 [facility] HH, moderate GERD, nl motility; seen at [facility] ([place] egd? neg, sone improvement on sucralfate susp  . Fibromyalgia   . Foot pain    Dr. Rushie Nyhan  . High triglycerides   . Hypothyroidism    Dr. Lewis Shock 4/14  . Leukocytoclastic vasculitis ([place])    Dr. Tonia Brooms  . Low HDL (under 40)   . Memory loss   . Mitral valve prolapse   . Paroxysmal atrial tachycardia (HCC)    Dr. Marlou Porch  . Pernicious anemia    Dr. Sabra Heck  . PONV (postoperative nausea and vomiting)    along time ago had nausea ans vomiting after surgery-25 years ago  . Reflux    ? delayed gastric emptying--GES nl 01/2012 (6% at 2hrs)  . RLS (restless legs syndrome)    low ferritin Dr. Sabra Heck  . Sleep apnea   . Uterine carcinoma (HCC)    Dr. Polly Cobia    Past Surgical History:  Procedure Laterality Date  . ABDOMINAL HYSTERECTOMY    . bladder tack     x 2  . BUNIONECTOMY    . CHOLECYSTECTOMY    . COLONOSCOPY     scr, 12/2008 ([facility]) nl  . COLOSTOMY Left 06/05/2018   Procedure: COLOSTOMY;  Surgeon: Ralene Ok, MD;  Location: [facility];  Service:  General;  Laterality: Left;  . COLOSTOMY REVERSAL N/A 10/16/2018   Procedure: HARTMANN'S COLOSTOMY REVERSAL, RIGID PROCTOSCOPY;  Surgeon: Ralene Ok, MD;  Location: [facility];  Service: General;  Laterality: N/A;  . HAMMER TOE SURGERY    . INCISIONAL HERNIA REPAIR  04/09/2019   x2  . INCISIONAL HERNIA REPAIR N/A 04/09/2019   Procedure: INCISIONAL HERNIA REPAIR WITH MESH;  Surgeon: Ralene Ok, MD;  Location: [facility];  Service: General;  Laterality: N/A;  . LAPAROSCOPY N/A 10/16/2018   Procedure: LAPAROSCOPY DIAGNOSTIC WITH LYSIS OF ADHESIONS;  Surgeon: Ralene Ok, MD;  Location: [facility];  Service: General;  Laterality: N/A;  . LUNG BIOPSY     L Lower lung pulm nodules, largest 4.55mm, low risk, repeat CT in 1 yr now following with pulm at [facility]; 9/13 Stable tiny lung nodules, no f/u needed  . LYSIS OF ADHESION N/A 04/09/2019   Procedure: OPEN LYSIS OF ADHESION;  Surgeon: Ralene Ok, MD;  Location: [facility];  Service: General;  Laterality: N/A;  . METATARSAL OSTEOTOMY    . NECK SURGERY     x 2  . PARTIAL COLECTOMY N/A 06/05/2018   Procedure: EXPLORATORY LAPAROTOMY AND  PARTIAL COLECTOMY;  Surgeon: Ralene Ok, MD;  Location: [place];  Service: General;  Laterality: N/A;  . TONSILLECTOMY    . US ECHOCARDIOGRAPHY     with Nuclear test, Dr. Marlou Porch, Low risk  . VESICOVAGINAL FISTULA CLOSURE W/ TAH      Current Medications: Current Meds  Medication Sig  . acetaminophen (TYLENOL) 500 MG tablet Take 2 tablets (1,000 mg total) by mouth every 6 (six) hours as needed.  Marland Kitchen albuterol (PROVENTIL HFA;VENTOLIN HFA) 108 (90 Base) MCG/ACT inhaler Inhale 2 puffs into the lungs every 4 (four) hours as needed for wheezing or shortness of breath.  Marland Kitchen aspirin EC 81 MG tablet Take 1 tablet (81 mg total) by mouth daily.  Marland Kitchen atorvastatin (LIPITOR) 10 MG tablet TAKE 1 TABLET BY MOUTH  DAILY  . Azelaic Acid (FINACEA EX) Apply 1 application topically daily.  Marland Kitchen buPROPion (WELLBUTRIN XL) 150 MG 24 hr  tablet Take 450 mg by mouth daily.   . Cholecalciferol (VITAMIN D3) 25 MCG (1000 UT) CAPS Take 2,000 Units by mouth 2 (two) times a day.   . diclofenac sodium (VOLTAREN) 1 % GEL Apply 2 g topically 3 (three) times daily as needed (pain).   Marland Kitchen doxycycline (PERIOSTAT) 20 MG tablet Take 20 mg by mouth 2 (two) times daily.  . DULoxetine (CYMBALTA) 30 MG capsule Take 90 mg by mouth at bedtime.   Marland Kitchen EPINEPHrine 0.3 mg/0.3 mL IJ SOAJ injection Inject 0.3 mg into the muscle as needed for anaphylaxis.   . ferrous sulfate 325 (65 FE) MG tablet Take 325 mg by mouth daily with breakfast.  . fluticasone (FLONASE) 50 MCG/ACT nasal spray Place 2 sprays into both nostrils daily as needed for allergies.   . fluticasone-salmeterol (ADVAIR HFA) 115-21 MCG/ACT inhaler Inhale 2 puffs into the lungs 2 (two) times daily.   . hydroxychloroquine (PLAQUENIL) 200 MG tablet Take 1 tablet by mouth 2 (two) times daily.  Marland Kitchen ipratropium (ATROVENT) 0.06 % nasal spray ipratropium bromide 42 mcg (0.06 %) nasal spray  . lactobacillus acidophilus (BACID) TABS tablet Take 1 tablet by mouth 2 (two) times daily.   Marland Kitchen levothyroxine (SYNTHROID) 125 MCG tablet Take 125 mcg by mouth at bedtime.   Marland Kitchen levothyroxine (SYNTHROID) 137 MCG tablet Take 137 mcg by mouth daily.  . metoprolol succinate (TOPROL-XL) 25 MG 24 hr tablet TAKE 1 TABLET BY MOUTH  DAILY  . metroNIDAZOLE (METROGEL) 0.75 % gel Apply 1 application topically 2 (two) times daily.  . naproxen sodium (ALEVE) 220 MG tablet Take 220 mg by mouth at bedtime as needed (headache).  . nitroGLYCERIN (NITROSTAT) 0.4 MG SL tablet Place 1 tablet (0.4 mg total) under the tongue every 5 (five) minutes as needed for chest pain.  . pantoprazole (PROTONIX) 40 MG tablet Take 40 mg by mouth 2 (two) times daily.  . pregabalin (LYRICA) 75 MG capsule Take 75 mg by mouth 2 (two) times daily.  . Probiotic Product (PROBIOTIC PO) Take 2 capsules by mouth daily.  . traZODone (DESYREL) 50 MG tablet Take 50 mg by  mouth as needed for sleep. Per patient taking 1/4 to 1/2 tablet as needed  . vitamin B-12 (CYANOCOBALAMIN) 1000 MCG tablet Take 1,000 mcg by mouth daily.      Allergies:   Bee venom, Peanut-containing drug products, Ambien [zolpidem tartrate], Tape, Ciprofloxacin, Latex, Minocycline, and Shellfish allergy   Social History   Socioeconomic History  . Marital status: Married    Spouse name: Not on file  . Number of children: Not  on file  . Years of education: Not on file  . Highest education level: Not on file  Occupational History  . Not on file  Tobacco Use  . Smoking status: Never Smoker  . Smokeless tobacco: Never Used  Vaping Use  . Vaping Use: Never used  Substance and Sexual Activity  . Alcohol use: Yes    Comment: maybe a glass of wine/month  . Drug use: No  . Sexual activity: Not on file  Other Topics Concern  . Not on file  Social History Narrative   Right handed   Caffeine~ 3-4 cups per day   Lives at home with husband    Social Determinants of Health   Financial Resource Strain:   . Difficulty of Paying Living Expenses: Not on file  Food Insecurity:   . Worried About Charity fundraiser in the Last Year: Not on file  . Ran Out of Food in the Last Year: Not on file  Transportation Needs:   . Lack of Transportation (Medical): Not on file  . Lack of Transportation (Non-Medical): Not on file  Physical Activity:   . Days of Exercise per Week: Not on file  . Minutes of Exercise per Session: Not on file  Stress:   . Feeling of Stress : Not on file  Social Connections:   . Frequency of Communication with Friends and Family: Not on file  . Frequency of Social Gatherings with Friends and Family: Not on file  . Attends Religious Services: Not on file  . Active Member of Clubs or Organizations: Not on file  . Attends Archivist Meetings: Not on file  . Marital Status: Not on file     Family History: The patient's family history includes Alzheimer's  disease in her mother; Hashimoto's thyroiditis in her mother; Hearing loss in her father; High blood pressure in her mother; Meniere's disease in her father; Other in her mother; Stroke in her father, mother, and sister; Ulcerative colitis in her father.  ROS:   Please see the history of present illness.     All other systems reviewed and are negative.  EKGs/Labs/Other Studies Reviewed:    The following studies were reviewed today:   EKG:  EKG is  ordered today.  The ekg ordered today demonstrates sinus rhythm 73 nonspecific ST-T wave changes  Recent Labs: No results found for requested labs within last 8760 hours.  Recent Lipid Panel    Component Value Date/Time   CHOL 132 05/23/2016 0910   TRIG 195 (H) 06/10/2018 0425   HDL 46 05/23/2016 0910   CHOLHDL 2.9 05/23/2016 0910   VLDL 46 (H) 05/23/2016 0910   LDLCALC 40 05/23/2016 0910     Risk Assessment/Calculations:     Physical Exam:    VS:  BP 120/70   Pulse 73   Ht 5\' 5"  (1.651 m)   Wt 189 lb 3.2 oz (85.8 kg)   SpO2 95%   BMI 31.48 kg/m     Wt Readings from Last 3 Encounters:  09/07/20 189 lb 3.2 oz (85.8 kg)  08/26/19 170 lb (77.1 kg)  07/22/19 174 lb (78.9 kg)    GEN:  Well nourished, well developed in no acute distress HEENT: Normal NECK: No JVD; No carotid bruits LYMPHATICS: No lymphadenopathy CARDIAC: RRR, no murmurs, rubs, gallops RESPIRATORY:  Clear to auscultation without rales, wheezing or rhonchi  ABDOMEN: Soft, non-tender, non-distended MUSCULOSKELETAL:  No edema; No deformity  SKIN: Warm and dry NEUROLOGIC:  Alert and  oriented x 3 PSYCHIATRIC:  Normal affect   ASSESSMENT:    1. Paroxysmal atrial tachycardia ([place])   2. Pure hypercholesterolemia   3. PFO (patent foramen ovale)    PLAN:    In order of problems listed above:  PFO -Noted on transcranial Doppler.  Has no stroke on MRI.  I discussed previously with Dr. Burt Knack all agree with aspirin 81.  Continue with aggressive risk factor  prevention.  She is concerned because her sister had a catastrophic stroke. -Hemoglobin 13.9 creatinine 0.9  Paroxysmal atrial tachycardia -Controlled with metoprolol no palpitations.  Hyperlipidemia -Statin no changes.  She is on atorvastatin 10 mg with LDL currently 63.  Excellent.  Mild carotid plaque.  Low-dose aspirin.  Previous atypical chest pain -No current symptoms  Obstructive sleep apnea -AHI in the 40s now down to 10.  Dr. Elenore Rota    Shared Decision Making/Informed Consent      Medication Adjustments/Labs and Tests Ordered: Current medicines are reviewed at length with the patient today.  Concerns regarding medicines are outlined above.  Orders Placed This Encounter  Procedures  . EKG 12-Lead   No orders of the defined types were placed in this encounter.   Patient Instructions  Medication Instructions:  The current medical regimen is effective;  continue present plan and medications.  *If you need a refill on your cardiac medications before your next appointment, please call your pharmacy*  Follow-Up: At [facility], you and your health needs are our priority.  As part of our continuing mission to provide you with exceptional heart care, we have created designated Provider Care Teams.  These Care Teams include your primary Cardiologist (physician) and Advanced Practice Providers (APPs -  Physician Assistants and Nurse Practitioners) who all work together to provide you with the care you need, when you need it.  We recommend signing up for the patient portal called "MyChart".  Sign up information is provided on this After Visit Summary.  MyChart is used to connect with patients for Virtual Visits (Telemedicine).  Patients are able to view lab/test results, encounter notes, upcoming appointments, etc.  Non-urgent messages can be sent to your provider as well.   To learn more about what you can do with MyChart, go to [email or web address].    Your next  appointment:   12 month(s)  The format for your next appointment:   In Person  Provider:   Candee Furbish, MD  Thank you for choosing [facility]!!        Signed, Candee Furbish, MD  09/07/2020 4:49 PM    [place]

## 2020-09-07 NOTE — Patient Instructions (Signed)
Medication Instructions:  The current medical regimen is effective;  continue present plan and medications.  *If you need a refill on your cardiac medications before your next appointment, please call your pharmacy*  Follow-Up: At [facility], you and your health needs are our priority.  As part of our continuing mission to provide you with exceptional heart care, we have created designated Provider Care Teams.  These Care Teams include your primary Cardiologist (physician) and Advanced Practice Providers (APPs -  Physician Assistants and Nurse Practitioners) who all work together to provide you with the care you need, when you need it.  We recommend signing up for the patient portal called "MyChart".  Sign up information is provided on this After Visit Summary.  MyChart is used to connect with patients for Virtual Visits (Telemedicine).  Patients are able to view lab/test results, encounter notes, upcoming appointments, etc.  Non-urgent messages can be sent to your provider as well.   To learn more about what you can do with MyChart, go to [email or web address].    Your next appointment:   12 month(s)  The format for your next appointment:   In Person  Provider:   Mark Skains, MD   Thank you for choosing [facility]!!      

## 2020-09-08 ENCOUNTER — Ambulatory Visit
Admission: RE | Admit: 2020-09-08 | Discharge: 2020-09-08 | Disposition: A | Discharge status: Home / Self Care | Payer: Medicare Other | Source: Ambulatory Visit | Attending: Physician Assistant | Admitting: Physician Assistant

## 2020-09-08 DIAGNOSIS — R1310 Dysphagia, unspecified: Secondary | ICD-10-CM

## 2020-09-08 DIAGNOSIS — T17300A Unspecified foreign body in larynx causing asphyxiation, initial encounter: Secondary | ICD-10-CM | POA: Diagnosis not present

## 2020-09-13 DIAGNOSIS — L501 Idiopathic urticaria: Secondary | ICD-10-CM | POA: Diagnosis not present

## 2020-09-14 DIAGNOSIS — M542 Cervicalgia: Secondary | ICD-10-CM | POA: Diagnosis not present

## 2020-09-17 DIAGNOSIS — M542 Cervicalgia: Secondary | ICD-10-CM | POA: Diagnosis not present

## 2020-09-17 NOTE — Addendum Note (Signed)
Addended by: Robyne Askew R on: 09/17/2020 03:09 PM   Modules accepted: Orders

## 2020-09-27 DIAGNOSIS — M542 Cervicalgia: Secondary | ICD-10-CM | POA: Diagnosis not present

## 2020-09-30 ENCOUNTER — Encounter (HOSPITAL_COMMUNITY): Payer: Medicare Other

## 2020-10-11 DIAGNOSIS — N3000 Acute cystitis without hematuria: Secondary | ICD-10-CM | POA: Diagnosis not present

## 2020-10-11 DIAGNOSIS — N3010 Interstitial cystitis (chronic) without hematuria: Secondary | ICD-10-CM | POA: Diagnosis not present

## 2020-10-11 DIAGNOSIS — R300 Dysuria: Secondary | ICD-10-CM | POA: Diagnosis not present

## 2020-10-15 DIAGNOSIS — L501 Idiopathic urticaria: Secondary | ICD-10-CM | POA: Diagnosis not present

## 2020-10-15 DIAGNOSIS — J4540 Moderate persistent asthma, uncomplicated: Secondary | ICD-10-CM | POA: Diagnosis not present

## 2020-10-21 ENCOUNTER — Ambulatory Visit (HOSPITAL_COMMUNITY): Payer: Medicare Other

## 2020-11-10 DIAGNOSIS — Z8542 Personal history of malignant neoplasm of other parts of uterus: Secondary | ICD-10-CM | POA: Diagnosis not present

## 2020-11-10 DIAGNOSIS — F3341 Major depressive disorder, recurrent, in partial remission: Secondary | ICD-10-CM | POA: Diagnosis not present

## 2020-11-10 DIAGNOSIS — F325 Major depressive disorder, single episode, in full remission: Secondary | ICD-10-CM | POA: Diagnosis not present

## 2020-11-10 DIAGNOSIS — E039 Hypothyroidism, unspecified: Secondary | ICD-10-CM | POA: Diagnosis not present

## 2020-11-10 DIAGNOSIS — J45998 Other asthma: Secondary | ICD-10-CM | POA: Diagnosis not present

## 2020-11-10 DIAGNOSIS — K219 Gastro-esophageal reflux disease without esophagitis: Secondary | ICD-10-CM | POA: Diagnosis not present

## 2020-11-10 DIAGNOSIS — J4540 Moderate persistent asthma, uncomplicated: Secondary | ICD-10-CM | POA: Diagnosis not present

## 2020-11-10 DIAGNOSIS — G4700 Insomnia, unspecified: Secondary | ICD-10-CM | POA: Diagnosis not present

## 2020-11-10 DIAGNOSIS — N183 Chronic kidney disease, stage 3 unspecified: Secondary | ICD-10-CM | POA: Diagnosis not present

## 2020-11-10 DIAGNOSIS — E782 Mixed hyperlipidemia: Secondary | ICD-10-CM | POA: Diagnosis not present

## 2020-11-12 DIAGNOSIS — R300 Dysuria: Secondary | ICD-10-CM | POA: Diagnosis not present

## 2020-11-12 DIAGNOSIS — N3020 Other chronic cystitis without hematuria: Secondary | ICD-10-CM | POA: Diagnosis not present

## 2020-11-12 DIAGNOSIS — L501 Idiopathic urticaria: Secondary | ICD-10-CM | POA: Diagnosis not present

## 2020-11-19 ENCOUNTER — Encounter (HOSPITAL_COMMUNITY): Payer: Self-pay

## 2020-11-19 ENCOUNTER — Encounter (HOSPITAL_COMMUNITY): Payer: Medicare Other

## 2020-12-14 DIAGNOSIS — E538 Deficiency of other specified B group vitamins: Secondary | ICD-10-CM | POA: Diagnosis not present

## 2020-12-14 DIAGNOSIS — U099 Post covid-19 condition, unspecified: Secondary | ICD-10-CM | POA: Diagnosis not present

## 2020-12-14 DIAGNOSIS — L501 Idiopathic urticaria: Secondary | ICD-10-CM | POA: Diagnosis not present

## 2020-12-14 DIAGNOSIS — Z6831 Body mass index (BMI) 31.0-31.9, adult: Secondary | ICD-10-CM | POA: Diagnosis not present

## 2020-12-14 DIAGNOSIS — E559 Vitamin D deficiency, unspecified: Secondary | ICD-10-CM | POA: Diagnosis not present

## 2020-12-14 DIAGNOSIS — R5383 Other fatigue: Secondary | ICD-10-CM | POA: Diagnosis not present

## 2020-12-17 ENCOUNTER — Ambulatory Visit (HOSPITAL_COMMUNITY): Payer: Medicare Other

## 2020-12-27 DIAGNOSIS — F325 Major depressive disorder, single episode, in full remission: Secondary | ICD-10-CM | POA: Diagnosis not present

## 2020-12-27 DIAGNOSIS — J4540 Moderate persistent asthma, uncomplicated: Secondary | ICD-10-CM | POA: Diagnosis not present

## 2020-12-27 DIAGNOSIS — J45998 Other asthma: Secondary | ICD-10-CM | POA: Diagnosis not present

## 2020-12-27 DIAGNOSIS — G4700 Insomnia, unspecified: Secondary | ICD-10-CM | POA: Diagnosis not present

## 2020-12-27 DIAGNOSIS — N183 Chronic kidney disease, stage 3 unspecified: Secondary | ICD-10-CM | POA: Diagnosis not present

## 2020-12-27 DIAGNOSIS — E782 Mixed hyperlipidemia: Secondary | ICD-10-CM | POA: Diagnosis not present

## 2020-12-27 DIAGNOSIS — E039 Hypothyroidism, unspecified: Secondary | ICD-10-CM | POA: Diagnosis not present

## 2020-12-27 DIAGNOSIS — K219 Gastro-esophageal reflux disease without esophagitis: Secondary | ICD-10-CM | POA: Diagnosis not present

## 2021-01-11 DIAGNOSIS — L501 Idiopathic urticaria: Secondary | ICD-10-CM | POA: Diagnosis not present

## 2021-01-14 ENCOUNTER — Other Ambulatory Visit: Payer: Self-pay

## 2021-01-14 ENCOUNTER — Ambulatory Visit (HOSPITAL_COMMUNITY)
Admission: RE | Admit: 2021-01-14 | Discharge: 2021-01-14 | Disposition: A | Discharge status: Home / Self Care | Payer: Medicare Other | Source: Ambulatory Visit | Attending: Physician Assistant | Admitting: Physician Assistant

## 2021-01-14 DIAGNOSIS — R1310 Dysphagia, unspecified: Secondary | ICD-10-CM | POA: Diagnosis not present

## 2021-01-14 DIAGNOSIS — R140 Abdominal distension (gaseous): Secondary | ICD-10-CM | POA: Diagnosis not present

## 2021-01-14 MED ORDER — TECHNETIUM TC 99M SULFUR COLLOID
2.1000 | Freq: Once | INTRAVENOUS | Status: AC | PRN
  Administered 2021-01-14: 2.1 via INTRAVENOUS

## 2021-01-21 DIAGNOSIS — K5901 Slow transit constipation: Secondary | ICD-10-CM | POA: Diagnosis not present

## 2021-01-21 DIAGNOSIS — R1111 Vomiting without nausea: Secondary | ICD-10-CM | POA: Diagnosis not present

## 2021-01-21 DIAGNOSIS — K219 Gastro-esophageal reflux disease without esophagitis: Secondary | ICD-10-CM | POA: Diagnosis not present

## 2021-01-23 DIAGNOSIS — Z8542 Personal history of malignant neoplasm of other parts of uterus: Secondary | ICD-10-CM | POA: Diagnosis not present

## 2021-01-23 DIAGNOSIS — F325 Major depressive disorder, single episode, in full remission: Secondary | ICD-10-CM | POA: Diagnosis not present

## 2021-01-23 DIAGNOSIS — F3341 Major depressive disorder, recurrent, in partial remission: Secondary | ICD-10-CM | POA: Diagnosis not present

## 2021-01-23 DIAGNOSIS — K219 Gastro-esophageal reflux disease without esophagitis: Secondary | ICD-10-CM | POA: Diagnosis not present

## 2021-01-23 DIAGNOSIS — J4540 Moderate persistent asthma, uncomplicated: Secondary | ICD-10-CM | POA: Diagnosis not present

## 2021-01-23 DIAGNOSIS — E782 Mixed hyperlipidemia: Secondary | ICD-10-CM | POA: Diagnosis not present

## 2021-01-23 DIAGNOSIS — N183 Chronic kidney disease, stage 3 unspecified: Secondary | ICD-10-CM | POA: Diagnosis not present

## 2021-01-23 DIAGNOSIS — J45998 Other asthma: Secondary | ICD-10-CM | POA: Diagnosis not present

## 2021-01-23 DIAGNOSIS — G4700 Insomnia, unspecified: Secondary | ICD-10-CM | POA: Diagnosis not present

## 2021-01-23 DIAGNOSIS — E039 Hypothyroidism, unspecified: Secondary | ICD-10-CM | POA: Diagnosis not present

## 2021-02-08 DIAGNOSIS — L501 Idiopathic urticaria: Secondary | ICD-10-CM | POA: Diagnosis not present

## 2021-02-11 DIAGNOSIS — Z1231 Encounter for screening mammogram for malignant neoplasm of breast: Secondary | ICD-10-CM | POA: Diagnosis not present

## 2021-02-25 DIAGNOSIS — Z23 Encounter for immunization: Secondary | ICD-10-CM | POA: Diagnosis not present

## 2021-03-07 DIAGNOSIS — I471 Supraventricular tachycardia: Secondary | ICD-10-CM | POA: Diagnosis not present

## 2021-03-07 DIAGNOSIS — Z6831 Body mass index (BMI) 31.0-31.9, adult: Secondary | ICD-10-CM | POA: Diagnosis not present

## 2021-03-07 DIAGNOSIS — F5101 Primary insomnia: Secondary | ICD-10-CM | POA: Diagnosis not present

## 2021-03-07 DIAGNOSIS — E782 Mixed hyperlipidemia: Secondary | ICD-10-CM | POA: Diagnosis not present

## 2021-03-07 DIAGNOSIS — M2550 Pain in unspecified joint: Secondary | ICD-10-CM | POA: Diagnosis not present

## 2021-03-07 DIAGNOSIS — E669 Obesity, unspecified: Secondary | ICD-10-CM | POA: Diagnosis not present

## 2021-03-07 DIAGNOSIS — F3341 Major depressive disorder, recurrent, in partial remission: Secondary | ICD-10-CM | POA: Diagnosis not present

## 2021-03-07 DIAGNOSIS — F411 Generalized anxiety disorder: Secondary | ICD-10-CM | POA: Diagnosis not present

## 2021-03-09 DIAGNOSIS — E039 Hypothyroidism, unspecified: Secondary | ICD-10-CM | POA: Diagnosis not present

## 2021-03-09 DIAGNOSIS — Z0000 Encounter for general adult medical examination without abnormal findings: Secondary | ICD-10-CM | POA: Diagnosis not present

## 2021-03-09 DIAGNOSIS — Z1389 Encounter for screening for other disorder: Secondary | ICD-10-CM | POA: Diagnosis not present

## 2021-03-16 DIAGNOSIS — L501 Idiopathic urticaria: Secondary | ICD-10-CM | POA: Diagnosis not present

## 2021-03-16 DIAGNOSIS — E063 Autoimmune thyroiditis: Secondary | ICD-10-CM | POA: Diagnosis not present

## 2021-03-16 DIAGNOSIS — E039 Hypothyroidism, unspecified: Secondary | ICD-10-CM | POA: Diagnosis not present

## 2021-03-18 DIAGNOSIS — Z79899 Other long term (current) drug therapy: Secondary | ICD-10-CM | POA: Diagnosis not present

## 2021-03-22 DIAGNOSIS — E039 Hypothyroidism, unspecified: Secondary | ICD-10-CM | POA: Diagnosis not present

## 2021-03-22 DIAGNOSIS — E782 Mixed hyperlipidemia: Secondary | ICD-10-CM | POA: Diagnosis not present

## 2021-03-22 DIAGNOSIS — J45998 Other asthma: Secondary | ICD-10-CM | POA: Diagnosis not present

## 2021-03-22 DIAGNOSIS — N183 Chronic kidney disease, stage 3 unspecified: Secondary | ICD-10-CM | POA: Diagnosis not present

## 2021-03-22 DIAGNOSIS — F325 Major depressive disorder, single episode, in full remission: Secondary | ICD-10-CM | POA: Diagnosis not present

## 2021-03-22 DIAGNOSIS — K219 Gastro-esophageal reflux disease without esophagitis: Secondary | ICD-10-CM | POA: Diagnosis not present

## 2021-03-22 DIAGNOSIS — Z8542 Personal history of malignant neoplasm of other parts of uterus: Secondary | ICD-10-CM | POA: Diagnosis not present

## 2021-03-22 DIAGNOSIS — F3341 Major depressive disorder, recurrent, in partial remission: Secondary | ICD-10-CM | POA: Diagnosis not present

## 2021-03-22 DIAGNOSIS — G4700 Insomnia, unspecified: Secondary | ICD-10-CM | POA: Diagnosis not present

## 2021-03-22 DIAGNOSIS — J4540 Moderate persistent asthma, uncomplicated: Secondary | ICD-10-CM | POA: Diagnosis not present

## 2021-04-11 DIAGNOSIS — E039 Hypothyroidism, unspecified: Secondary | ICD-10-CM | POA: Diagnosis not present

## 2021-04-12 DIAGNOSIS — L501 Idiopathic urticaria: Secondary | ICD-10-CM | POA: Diagnosis not present

## 2021-04-20 DIAGNOSIS — G4733 Obstructive sleep apnea (adult) (pediatric): Secondary | ICD-10-CM | POA: Diagnosis not present

## 2021-04-20 DIAGNOSIS — J3089 Other allergic rhinitis: Secondary | ICD-10-CM | POA: Diagnosis not present

## 2021-04-20 DIAGNOSIS — G2581 Restless legs syndrome: Secondary | ICD-10-CM | POA: Diagnosis not present

## 2021-04-25 DIAGNOSIS — G2581 Restless legs syndrome: Secondary | ICD-10-CM | POA: Diagnosis not present

## 2021-04-27 DIAGNOSIS — E782 Mixed hyperlipidemia: Secondary | ICD-10-CM | POA: Diagnosis not present

## 2021-04-27 DIAGNOSIS — F325 Major depressive disorder, single episode, in full remission: Secondary | ICD-10-CM | POA: Diagnosis not present

## 2021-04-27 DIAGNOSIS — F3341 Major depressive disorder, recurrent, in partial remission: Secondary | ICD-10-CM | POA: Diagnosis not present

## 2021-04-27 DIAGNOSIS — G4700 Insomnia, unspecified: Secondary | ICD-10-CM | POA: Diagnosis not present

## 2021-04-27 DIAGNOSIS — J45998 Other asthma: Secondary | ICD-10-CM | POA: Diagnosis not present

## 2021-04-27 DIAGNOSIS — N183 Chronic kidney disease, stage 3 unspecified: Secondary | ICD-10-CM | POA: Diagnosis not present

## 2021-04-27 DIAGNOSIS — K219 Gastro-esophageal reflux disease without esophagitis: Secondary | ICD-10-CM | POA: Diagnosis not present

## 2021-04-27 DIAGNOSIS — E039 Hypothyroidism, unspecified: Secondary | ICD-10-CM | POA: Diagnosis not present

## 2021-04-27 DIAGNOSIS — J4540 Moderate persistent asthma, uncomplicated: Secondary | ICD-10-CM | POA: Diagnosis not present

## 2021-04-27 DIAGNOSIS — Z8542 Personal history of malignant neoplasm of other parts of uterus: Secondary | ICD-10-CM | POA: Diagnosis not present

## 2021-05-16 DIAGNOSIS — L501 Idiopathic urticaria: Secondary | ICD-10-CM | POA: Diagnosis not present

## 2021-05-17 DIAGNOSIS — Z20822 Contact with and (suspected) exposure to covid-19: Secondary | ICD-10-CM | POA: Diagnosis not present

## 2021-05-20 DIAGNOSIS — M189 Osteoarthritis of first carpometacarpal joint, unspecified: Secondary | ICD-10-CM | POA: Insufficient documentation

## 2021-05-20 DIAGNOSIS — M1811 Unilateral primary osteoarthritis of first carpometacarpal joint, right hand: Secondary | ICD-10-CM | POA: Diagnosis not present

## 2021-05-20 DIAGNOSIS — M13842 Other specified arthritis, left hand: Secondary | ICD-10-CM | POA: Diagnosis not present

## 2021-05-27 DIAGNOSIS — K219 Gastro-esophageal reflux disease without esophagitis: Secondary | ICD-10-CM | POA: Diagnosis not present

## 2021-05-27 DIAGNOSIS — R1110 Vomiting, unspecified: Secondary | ICD-10-CM | POA: Diagnosis not present

## 2021-05-27 DIAGNOSIS — K5904 Chronic idiopathic constipation: Secondary | ICD-10-CM | POA: Diagnosis not present

## 2021-06-13 DIAGNOSIS — L501 Idiopathic urticaria: Secondary | ICD-10-CM | POA: Diagnosis not present

## 2021-06-29 ENCOUNTER — Other Ambulatory Visit: Payer: Self-pay | Admitting: Cardiology

## 2021-06-30 ENCOUNTER — Other Ambulatory Visit: Payer: Self-pay | Admitting: Cardiology

## 2021-07-05 ENCOUNTER — Encounter: Payer: Self-pay | Admitting: Family Medicine

## 2021-07-05 ENCOUNTER — Other Ambulatory Visit: Payer: Self-pay

## 2021-07-05 ENCOUNTER — Ambulatory Visit (INDEPENDENT_AMBULATORY_CARE_PROVIDER_SITE_OTHER): Payer: Medicare Other | Admitting: Family Medicine

## 2021-07-05 VITALS — BP 130/80 | HR 89 | Temp 97.1°F | Ht 65.0 in | Wt 184.2 lb

## 2021-07-05 DIAGNOSIS — F339 Major depressive disorder, recurrent, unspecified: Secondary | ICD-10-CM | POA: Diagnosis not present

## 2021-07-05 DIAGNOSIS — G894 Chronic pain syndrome: Secondary | ICD-10-CM

## 2021-07-05 DIAGNOSIS — I471 Supraventricular tachycardia: Secondary | ICD-10-CM | POA: Diagnosis not present

## 2021-07-05 DIAGNOSIS — E039 Hypothyroidism, unspecified: Secondary | ICD-10-CM | POA: Diagnosis not present

## 2021-07-05 DIAGNOSIS — I4719 Other supraventricular tachycardia: Secondary | ICD-10-CM

## 2021-07-05 DIAGNOSIS — D519 Vitamin B12 deficiency anemia, unspecified: Secondary | ICD-10-CM

## 2021-07-05 DIAGNOSIS — M797 Fibromyalgia: Secondary | ICD-10-CM | POA: Diagnosis not present

## 2021-07-05 DIAGNOSIS — L959 Vasculitis limited to the skin, unspecified: Secondary | ICD-10-CM

## 2021-07-05 DIAGNOSIS — E538 Deficiency of other specified B group vitamins: Secondary | ICD-10-CM | POA: Diagnosis not present

## 2021-07-05 DIAGNOSIS — R5382 Chronic fatigue, unspecified: Secondary | ICD-10-CM

## 2021-07-05 DIAGNOSIS — M791 Myalgia, unspecified site: Secondary | ICD-10-CM

## 2021-07-05 MED ORDER — CYCLOBENZAPRINE HCL 10 MG PO TABS
10.0000 mg | ORAL_TABLET | Freq: Three times a day (TID) | ORAL | 0 refills | Status: DC | PRN
Start: 2021-07-05 — End: 2021-12-08

## 2021-07-05 MED ORDER — CYANOCOBALAMIN 1000 MCG/ML IJ SOLN: 1000.0000 ug | INTRAMUSCULAR | 1 refills | Status: DC

## 2021-07-05 MED ORDER — ARIPIPRAZOLE 2 MG PO TABS: 2.0000 mg | ORAL_TABLET | Freq: Every day | ORAL | 0 refills | Status: DC

## 2021-07-05 NOTE — Progress Notes (Signed)
[name]   Provider:  Alain Honey, MD  Careteam: Patient Care Team: Kathyrn Lass, MD as PCP - General (Family Medicine) Jerline Pain, MD as PCP - Cardiology (Cardiology) Warren Danes, PA-C as Physician Assistant (Dermatology)  PLACE OF SERVICE:  [place]  Advanced Directive information    Allergies  Allergen Reactions   Bee Venom Shortness Of Breath, Itching and Swelling   Peanut-Containing Drug Products Anaphylaxis   Ambien [Zolpidem Tartrate] Other (See Comments)    Hallucinations   Tape Itching, Swelling and Rash   Ciprofloxacin Nausea And Vomiting   Latex Rash   Minocycline Rash   Shellfish Allergy Rash    Chief Complaint  Patient presents with   New Patient (Initial Visit)    Patient presents today for a new patient appointment.     HPI: Patient is a 65 y.o. female .  She is a new patient here who has been followed at [facility] primary care.  She has a long history of chronic fatigue syndrome.  She has been on multiple regimens and seeing several doctors over the years since her diagnosis.  Currently she takes bupropion, Cymbalta, pregabalin, for symptoms related.  She describes recent low as far as energy.  She is not sleeping well.  She also describes pain in her muscles and joints. Other problems include Hashimoto's thyroiditis, supraventricular tachycardia, elevated cholesterol,. Also has problem with urticarial vasculitis and sees a dermatologist for that problem. She is on multi vitamin regimen.  Had previously taken B12 injections but since she had gone on oral medicine did not feel like this was as effective. History of restless legs for which she takes primarPramipexole. She chose this practice because she read somewhere I was a specialist and chronic fatigue syndrome.  We discussed the fact that I have treated before but do not have any expertise.  Would like for her to see Dr. Cyndie Chime to our who formally sent patients to with this problem. We just  spent some time sharing and talking about chronic fatigue syndrome and that is frustrating both to patient and providers as a try to deal with the multi and varied symptoms. She had lab work done within the past 6 months that I am not repeating today.  Review of Systems:  Review of Systems  Constitutional:  Positive for malaise/fatigue.  Musculoskeletal:  Positive for myalgias.  Psychiatric/Behavioral:  Positive for depression. The patient has insomnia.   All other systems reviewed and are negative.  Past Medical History:  Diagnosis Date   Asthma    Back pain    Chronic fatigue syndrome    Dr. Sabra Heck   Complication of anesthesia    Cough    /SOB, PFT's nl, improved with Bronchodilation 8/11   Dysrhythmia    Esophageal spasm    GERD Dr. Sabra Heck, Dr. Wynetta Emery; egd 2006 nl; UGI 3/13 [facility], moderate GERD, nl motility; seen at [facility] ([place]), egd? neg, sone improvement on sucralfate susp   Fibromyalgia    Foot pain    Dr. Rushie Nyhan   High triglycerides    Hypothyroidism    Dr. Lewis Shock 4/14   Leukocytoclastic vasculitis ([place])    Dr. Tonia Brooms   Low HDL (under 40)    Memory loss    Mitral valve prolapse    Paroxysmal atrial tachycardia ([place])    Dr. Marlou Porch   Pernicious anemia    Dr. Sabra Heck   PONV (postoperative nausea and vomiting)    along time ago had nausea ans vomiting after  surgery-25 years ago   Reflux    ? delayed gastric emptying--GES nl 01/2012 (6% at 2hrs)   RLS (restless legs syndrome)    low ferritin Dr. Sabra Heck   Sleep apnea    Uterine carcinoma [facility])    Dr. Polly Cobia   Past Surgical History:  Procedure Laterality Date   ABDOMINAL HYSTERECTOMY     bladder tack     x 2   BUNIONECTOMY     CHOLECYSTECTOMY     COLONOSCOPY     scr, 12/2008 ([place]) nl   COLOSTOMY Left 06/05/2018   Procedure: COLOSTOMY;  Surgeon: Ralene Ok, MD;  Location: [place];  Service: General;  Laterality: Left;   COLOSTOMY REVERSAL N/A 10/16/2018   Procedure: HARTMANN'S COLOSTOMY REVERSAL,  RIGID PROCTOSCOPY;  Surgeon: Ralene Ok, MD;  Location: [facility];  Service: General;  Laterality: N/A;   [place]  04/09/2019   x2   [place] N/A 04/09/2019   Procedure: INCISIONAL HERNIA REPAIR WITH MESH;  Surgeon: Ralene Ok, MD;  Location: [place];  Service: General;  Laterality: N/A;   LAPAROSCOPY N/A 10/16/2018   Procedure: LAPAROSCOPY DIAGNOSTIC WITH LYSIS OF ADHESIONS;  Surgeon: Ralene Ok, MD;  Location: [facility];  Service: General;  Laterality: N/A;   LUNG BIOPSY     L Lower lung pulm nodules, largest 4.63m, low risk, repeat CT in 1 yr now [facility] Stable tiny lung nodules, no f/u needed   LYSIS OF ADHESION N/A 04/09/2019   Procedure: OPEN LYSIS OF ADHESION;  Surgeon: RRalene Ok MD;  [place]  Service: General;  Laterality: N/A;   METATARSAL OSTEOTOMY     NECK SURGERY     x 2   PARTIAL COLECTOMY N/A 06/05/2018   Procedure: EXPLORATORY LAPAROTOMY AND PARTIAL COLECTOMY;  Surgeon: RRalene Ok MD;  [place]  Service: General;  Laterality: N/A;   TONSILLECTOMY     [place]     with Nuclear test, Dr. SMarlou Porch Low risk   VESICOVAGINAL FISTULA CLOSURE W/ TAH     Social History:   reports that she has never smoked. She has never used smokeless tobacco. She reports current alcohol use. She reports that she does not use drugs.  Family History  Problem Relation Age of Onset   Hashimoto's thyroiditis Mother    High blood pressure Mother    Stroke Mother    Other Mother        Borderline DM   Alzheimer's disease Mother    Ulcerative colitis Father    Meniere's disease Father    Hearing loss Father    Stroke Father    Stroke Sister     Medications: Patient's Medications  New Prescriptions   No medications on file  Previous Medications   ACETAMINOPHEN (TYLENOL) 500 MG TABLET    Take 2 tablets (1,000 mg total) by mouth every 6 (six) hours as needed.    ALBUTEROL (PROVENTIL HFA;VENTOLIN HFA) 108 (90 BASE) MCG/ACT INHALER    Inhale 2 puffs into the lungs every 4 (four) hours as needed for wheezing or shortness of breath.   ASPIRIN EC 81 MG TABLET    Take 1 tablet (81 mg total) by mouth daily.   ATORVASTATIN (LIPITOR) 10 MG TABLET    TAKE 1 TABLET BY MOUTH  DAILY   AZELAIC ACID (FINACEA EX)    Apply 1 application topically daily.   BUPROPION (WELLBUTRIN XL) 150 MG 24 HR  TABLET    Take 450 mg by mouth daily.    CHOLECALCIFEROL (VITAMIN D3) 25 MCG (1000 UT) CAPS    Take 2,000 Units by mouth 2 (two) times a day.    DICLOFENAC SODIUM (VOLTAREN) 1 % GEL    Apply 2 g topically 3 (three) times daily as needed (pain).    DOXYCYCLINE (PERIOSTAT) 20 MG TABLET    Take 20 mg by mouth 2 (two) times daily.   DULOXETINE (CYMBALTA) 30 MG CAPSULE    Take 90 mg by mouth at bedtime.    EPINEPHRINE 0.3 MG/0.3 ML IJ SOAJ INJECTION    Inject 0.3 mg into the muscle as needed for anaphylaxis.    FERROUS SULFATE 325 (65 FE) MG TABLET    Take 325 mg by mouth daily with breakfast.   FLUTICASONE (FLONASE) 50 MCG/ACT NASAL SPRAY    Place 2 sprays into both nostrils daily as needed for allergies.    FLUTICASONE-SALMETEROL (ADVAIR HFA) 115-21 MCG/ACT INHALER    Inhale 2 puffs into the lungs 2 (two) times daily.   HYDROXYCHLOROQUINE (PLAQUENIL) 200 MG TABLET    Take 1 tablet by mouth 2 (two) times daily.   IPRATROPIUM (ATROVENT) 0.06 % NASAL SPRAY    ipratropium bromide 42 mcg (0.06 %) nasal spray   LACTOBACILLUS ACIDOPHILUS (BACID) TABS TABLET    Take 1 tablet by mouth 2 (two) times daily.    LEVOTHYROXINE (SYNTHROID) 125 MCG TABLET    Take 125 mcg by mouth at bedtime.    LEVOTHYROXINE (SYNTHROID) 137 MCG TABLET    Take 137 mcg by mouth daily.   METOPROLOL SUCCINATE (TOPROL-XL) 25 MG 24 HR TABLET    TAKE 1 TABLET BY MOUTH  DAILY   METRONIDAZOLE (METROGEL) 0.75 % GEL    Apply 1 application topically 2 (two) times daily.   NAPROXEN SODIUM (ALEVE) 220 MG TABLET    Take 220 mg by  mouth at bedtime as needed (headache).   NITROGLYCERIN (NITROSTAT) 0.4 MG SL TABLET    Place 1 tablet (0.4 mg total) under the tongue every 5 (five) minutes as needed for chest pain.   PANTOPRAZOLE (PROTONIX) 40 MG TABLET    Take 40 mg by mouth 2 (two) times daily.   PREGABALIN (LYRICA) 75 MG CAPSULE    Take 75 mg by mouth 2 (two) times daily.   PROBIOTIC PRODUCT (PROBIOTIC PO)    Take 2 capsules by mouth daily.   TRAZODONE (DESYREL) 50 MG TABLET    Take 50 mg by mouth as needed for sleep. Per patient taking 1/4 to 1/2 tablet as needed   VITAMIN B-12 (CYANOCOBALAMIN) 1000 MCG TABLET    Take 1,000 mcg by mouth daily.   Modified Medications   No medications on file  Discontinued Medications   No medications on file    Physical Exam:  Vitals:   07/05/21 1041  BP: 130/80  Pulse: 89  Temp: (!) 97.1 F (36.2 C)  TempSrc: Temporal  SpO2: 98%  Weight: 184 lb 3.2 oz (83.6 kg)  Height: '5\' 5"'$  (1.651 m)   Body mass index is 30.65 kg/m. Wt Readings from Last 3 Encounters:  07/05/21 184 lb 3.2 oz (83.6 kg)  09/07/20 189 lb 3.2 oz (85.8 kg)  08/26/19 170 lb (77.1 kg)    Physical Exam Vitals and nursing note reviewed.  Constitutional:      Appearance: Normal appearance.  HENT:     Head: Normocephalic.  Cardiovascular:     Rate and Rhythm: Normal rate and  regular rhythm.  Pulmonary:     Effort: Pulmonary effort is normal.     Breath sounds: Normal breath sounds.  Neurological:     General: No focal deficit present.     Mental Status: She is alert and oriented to person, place, and time.    Labs reviewed: Basic Metabolic Panel: No results for input(s): NA, K, CL, CO2, GLUCOSE, BUN, CREATININE, CALCIUM, MG, PHOS, TSH in the last 8760 hours. Liver Function Tests: No results for input(s): AST, ALT, ALKPHOS, BILITOT, PROT, ALBUMIN in the last 8760 hours. No results for input(s): LIPASE, AMYLASE in the last 8760 hours. No results for input(s): AMMONIA in the last 8760  hours. CBC: No results for input(s): WBC, NEUTROABS, HGB, HCT, MCV, PLT in the last 8760 hours. Lipid Panel: No results for input(s): CHOL, HDL, LDLCALC, TRIG, CHOLHDL, LDLDIRECT in the last 8760 hours. TSH: No results for input(s): TSH in the last 8760 hours. A1C: Lab Results  Component Value Date   HGBA1C 5.6 10/14/2018     Assessment/Plan  1. Depression, recurrent ([place]) Has been on Wellbutrin for some time.  Rather than start all over with SSRI or SNRI we will add Abilify.  Hopefully this will help her sleep as well.  We will try this for 1 month - ARIPiprazole (ABILIFY) 2 MG tablet; Take 1 tablet (2 mg total) by mouth daily.  Dispense: 30 tablet; Refill: 0  2. Myalgia, unspecified site Myalgias are part of the fibromyalgia that is part of the chronic fatigue.  I will add muscle relaxant to current regimen - cyclobenzaprine (FLEXERIL) 10 MG tablet; Take 1 tablet (10 mg total) by mouth 3 (three) times daily as needed for muscle spasms.  Dispense: 30 tablet; Refill: 0  3. B12 deficiency Relief felt like injectable B12 was more effective as far as energy and oral.  There is some vague history that she lacked some intrinsic factor in the past based on blood test if this is the case injections may help - cyanocobalamin (,VITAMIN B-12,) 1000 MCG/ML injection; Inject 1 mL (1,000 mcg total) into the muscle every 30 (thirty) days.  Dispense: 1 mL; Refill: 1  4. Chronic fatigue syndrome with fibromyalgia Would like to enlist the help of rheumatologist with experience in this area - Ambulatory referral to Rheumatology  5. Anemia due to vitamin B12 deficiency, unspecified B12 deficiency type We will try injections as outlined above  6. Hypothyroidism, unspecified type She sees endocrinologist for this problem and apparently TSH is normal  7. Paroxysmal atrial tachycardia [facility]) Sees cardiologist annually.  No recent issues  8. Vasculitis of skin Sees dermatologist regularly and is on  a regimen of antihistamines as well as Xolair injections monthly  9. Fibromyalgia Patient takes only Tylenol for pain.  I did add muscle relaxant and I think finding a better regimen of antidepressants will help.  I expressed concern about use of analgesics especially addictive ones  10. Chronic pain syndrome See above  Time spent in counseling and discussion 45 minutes Alain Honey, MD [place] [phone]

## 2021-07-06 ENCOUNTER — Telehealth: Payer: Self-pay | Admitting: *Deleted

## 2021-07-06 ENCOUNTER — Other Ambulatory Visit: Payer: Self-pay | Admitting: *Deleted

## 2021-07-06 MED ORDER — NEEDLES & SYRINGES MISC: 3 refills | Status: DC

## 2021-07-06 NOTE — Telephone Encounter (Signed)
Patient called and stated that she is a patient of Dr. Ammie Ferrier and he prescribed her Vitamin B12 injections and she wanted to know if she gives this to herself Muscle or Subcutaneous.  Explained to patient that it can be given either way.  Patient stated that she had needles/syringes on hand and they were sterile and clean.

## 2021-07-06 NOTE — Telephone Encounter (Signed)
Patient called requesting SubQ syringes to give herself her Vitamin B12 injections that Dr. Sabra Heck prescribed.

## 2021-07-12 DIAGNOSIS — L501 Idiopathic urticaria: Secondary | ICD-10-CM | POA: Diagnosis not present

## 2021-07-13 ENCOUNTER — Other Ambulatory Visit: Payer: Self-pay | Admitting: *Deleted

## 2021-07-13 DIAGNOSIS — F339 Major depressive disorder, recurrent, unspecified: Secondary | ICD-10-CM

## 2021-07-13 MED ORDER — ARIPIPRAZOLE 2 MG PO TABS: 2.0000 mg | ORAL_TABLET | Freq: Every day | ORAL | 1 refills | Status: DC

## 2021-07-13 NOTE — Telephone Encounter (Signed)
[facility] requested refill.  ? ?

## 2021-08-02 ENCOUNTER — Other Ambulatory Visit: Payer: Self-pay

## 2021-08-02 ENCOUNTER — Encounter: Payer: Self-pay | Admitting: Family Medicine

## 2021-08-02 ENCOUNTER — Ambulatory Visit (INDEPENDENT_AMBULATORY_CARE_PROVIDER_SITE_OTHER): Payer: Medicare Other | Admitting: Family Medicine

## 2021-08-02 VITALS — BP 120/70 | HR 82 | Temp 96.9°F | Ht 65.0 in | Wt 189.4 lb

## 2021-08-02 DIAGNOSIS — G894 Chronic pain syndrome: Secondary | ICD-10-CM

## 2021-08-02 DIAGNOSIS — Z23 Encounter for immunization: Secondary | ICD-10-CM | POA: Diagnosis not present

## 2021-08-02 DIAGNOSIS — G8929 Other chronic pain: Secondary | ICD-10-CM | POA: Diagnosis not present

## 2021-08-02 DIAGNOSIS — M797 Fibromyalgia: Secondary | ICD-10-CM

## 2021-08-02 DIAGNOSIS — G9332 Myalgic encephalomyelitis/chronic fatigue syndrome: Secondary | ICD-10-CM

## 2021-08-02 DIAGNOSIS — M5442 Lumbago with sciatica, left side: Secondary | ICD-10-CM | POA: Diagnosis not present

## 2021-08-02 DIAGNOSIS — D510 Vitamin B12 deficiency anemia due to intrinsic factor deficiency: Secondary | ICD-10-CM

## 2021-08-02 DIAGNOSIS — M549 Dorsalgia, unspecified: Secondary | ICD-10-CM | POA: Insufficient documentation

## 2021-08-02 MED ORDER — PREDNISONE 20 MG PO TABS: ORAL_TABLET | ORAL | 0 refills | Status: DC

## 2021-08-02 NOTE — Progress Notes (Signed)
prednison   Provider:  Alain Honey, MD  Careteam: Patient Care Team: Wardell Honour, MD as PCP - General (Family Medicine) Jerline Pain, MD as PCP - Cardiology (Cardiology) Warren Danes, PA-C as Physician Assistant (Dermatology) Tiajuana Amass, MD as Referring Physician (Allergy and Immunology) Ronald Lobo, MD as Consulting Physician (Gastroenterology) Ralene Ok, MD as Consulting Physician (General Surgery) Melina Schools, MD as Consulting Physician (Orthopedic Surgery) Baxter Kail, MD (Internal Medicine) Regal, Tamala Fothergill, DPM as Consulting Physician (Podiatry) Jacelyn Pi, MD as Referring Physician (Endocrinology) Sanjuana Kava, MD as Referring Physician (Obstetrics and Gynecology) Fonnie Mu, OD (Optometry) Rana Snare, MD (Inactive) (Urology) Blima Dessert, MD as Referring Physician (Rheumatology)  PLACE OF SERVICE:  [place]  Advanced Directive information    Allergies  Allergen Reactions   Bee Venom Shortness Of Breath, Itching and Swelling   Peanut-Containing Drug Products Anaphylaxis   Ambien [Zolpidem Tartrate] Other (See Comments)    Hallucinations   Tape Itching, Swelling and Rash   Ciprofloxacin Nausea And Vomiting   Latex Rash   Minocycline Rash   Shellfish Allergy Rash    Chief Complaint  Patient presents with   Medical Management of Chronic Issues    Patient presents today for a 4 week follow-up.   Quality Metric Gaps    Mammogram, colonoscopy, DEXA scan, zoster, flu and COVID booster vaccines     HPI: Patient is a 65 y.o. female .  This patient erroneously ended up here at her first visit thinking I was a specialist with chronic fatigue syndrome.  Once we have gotten that clarified, I will be attempting to treat her as best I can.  Rehab we had referred her to rheumatologist who no longer accepts patients with this diagnosis. At her last visit determined that sleep was a major problem.  Added Abilify.  She reports  that she is sleeping better. We also tried injection of B12 which she felt was more effective than the oral medicine and she thinks that may be helping as well. She has taken Flexeril a few times for some hip and leg pain.  She continues to use her CPAP at night and gets good reports when the machine rates her sleep quality.  Review of Systems:  Review of Systems  Constitutional:  Positive for malaise/fatigue.  Respiratory: Negative.    Cardiovascular: Negative.   Musculoskeletal:  Positive for back pain and myalgias.  Psychiatric/Behavioral:  Positive for depression. The patient has insomnia.    Past Medical History:  Diagnosis Date   Asthma    Back pain    Chronic fatigue syndrome    Dr. Sabra Heck   Complication of anesthesia    Cough    /SOB, PFT's nl, improved with Bronchodilation 8/11   Dysrhythmia    Endometrial cancer ([place])    Esophageal spasm    GERD Dr. Sabra Heck, Dr. Wynetta Emery; egd 2006 nl; UGI 3/13 [facility] HH, moderate GERD, nl motility; seen at [facility] ([place]), egd? neg, sone improvement on sucralfate susp   Fibromyalgia    Foot pain    Dr. Rushie Nyhan   Hashimoto's thyroiditis    High triglycerides    Hypothyroidism    Dr. Lewis Shock 4/14   Leukocytoclastic vasculitis ([place])    Dr. Tonia Brooms   Low HDL (under 40)    Memory loss    Mitral valve prolapse    Paroxysmal atrial tachycardia ([place])    Dr. Marlou Porch   Pernicious anemia    Dr. Sabra Heck   PONV (postoperative nausea and  vomiting)    along time ago had nausea ans vomiting after surgery-25 years ago   Reflux    ? delayed gastric emptying--GES nl 01/2012 (6% at 2hrs)   RLS (restless legs syndrome)    low ferritin Dr. Sabra Heck   Rosacea    Sleep apnea    Uterine carcinoma [facility])    Dr. Polly Cobia   Past Surgical History:  Procedure Laterality Date   ABDOMINAL HYSTERECTOMY     bladder tack     x 2   BUNIONECTOMY     CHOLECYSTECTOMY  2009   COLONOSCOPY     scr, 12/2008 ([place]) nl   COLOSTOMY Left 06/05/2018   Procedure:  COLOSTOMY;  Surgeon: Ralene Ok, MD;  Location: [place];  Service: General;  Laterality: Left;   COLOSTOMY REVERSAL N/A 10/16/2018   Procedure: HARTMANN'S COLOSTOMY REVERSAL, RIGID PROCTOSCOPY;  Surgeon: Ralene Ok, MD;  Location: [facility];  Service: General;  Laterality: N/A;   HAMMER TOE SURGERY     HYSTERECTOMY ABDOMINAL WITH SALPINGO-OOPHORECTOMY  2008   [place]  04/09/2019   x2   [place] N/A 04/09/2019   Procedure: INCISIONAL HERNIA REPAIR WITH MESH;  Surgeon: Ralene Ok, MD;  Location: [place];  Service: General;  Laterality: N/A;   LAPAROSCOPY N/A 10/16/2018   Procedure: LAPAROSCOPY DIAGNOSTIC WITH LYSIS OF ADHESIONS;  Surgeon: Ralene Ok, MD;  Location: [facility];  Service: General;  Laterality: N/A;   LUNG BIOPSY     L Lower lung pulm nodules, largest 4.25mm, low risk, repeat CT in 1 yr now following with pulm at [facility]; 9/13 Stable tiny lung nodules, no f/u needed   LYMPH NODE DISSECTION  2008   LYSIS OF ADHESION N/A 04/09/2019   Procedure: OPEN LYSIS OF ADHESION;  Surgeon: Ralene Ok, MD;  Location: [place];  Service: General;  Laterality: N/A;   METATARSAL OSTEOTOMY     NECK SURGERY     x 2   PARTIAL COLECTOMY N/A 06/05/2018   Procedure: EXPLORATORY LAPAROTOMY AND PARTIAL COLECTOMY;  Surgeon: Ralene Ok, MD;  Location: [place];  Service: General;  Laterality: N/A;   TONSILLECTOMY     US ECHOCARDIOGRAPHY     with Nuclear test, Dr. Marlou Porch, Low risk   VESICOVAGINAL FISTULA CLOSURE W/ TAH     Social History:   reports that she has never smoked. She has never used smokeless tobacco. She reports current alcohol use. She reports that she does not use drugs.  Family History  Problem Relation Age of Onset   Hashimoto's thyroiditis Mother    High blood pressure Mother    Stroke Mother    Other Mother        Borderline DM   Alzheimer's disease Mother    Ulcerative colitis Father    Meniere's disease Father    Hearing loss  Father    Stroke Father    Stroke Sister     Medications: Patient's Medications  New Prescriptions   No medications on file  Previous Medications   ACETAMINOPHEN (TYLENOL) 500 MG TABLET    Take 2 tablets (1,000 mg total) by mouth every 6 (six) hours as needed.   ALBUTEROL (PROVENTIL HFA;VENTOLIN HFA) 108 (90 BASE) MCG/ACT INHALER    Inhale 2 puffs into the lungs every 4 (four) hours as needed for wheezing or shortness of breath.   ARIPIPRAZOLE (ABILIFY) 2 MG TABLET    Take 1 tablet (2 mg total) by mouth daily.   ASPIRIN EC 81 MG TABLET  Take 1 tablet (81 mg total) by mouth daily.   ATORVASTATIN (LIPITOR) 10 MG TABLET    TAKE 1 TABLET BY MOUTH  DAILY   AZELAIC ACID (FINACEA EX)    Apply 1 application topically daily.   BUPROPION (WELLBUTRIN XL) 150 MG 24 HR TABLET    Take 450 mg by mouth daily.    CHOLECALCIFEROL (VITAMIN D3) 25 MCG (1000 UT) CAPS    Take 2,000 Units by mouth 2 (two) times a day.    CYANOCOBALAMIN (,VITAMIN B-12,) 1000 MCG/ML INJECTION    Inject 1 mL (1,000 mcg total) into the muscle every 30 (thirty) days.   CYCLOBENZAPRINE (FLEXERIL) 10 MG TABLET    Take 1 tablet (10 mg total) by mouth 3 (three) times daily as needed for muscle spasms.   DICLOFENAC SODIUM (VOLTAREN) 1 % GEL    Apply 2 g topically 3 (three) times daily as needed (pain).    DOXYCYCLINE (PERIOSTAT) 20 MG TABLET    Take 20 mg by mouth 2 (two) times daily.   DULOXETINE (CYMBALTA) 30 MG CAPSULE    Take 90 mg by mouth at bedtime.    EPINEPHRINE 0.3 MG/0.3 ML IJ SOAJ INJECTION    Inject 0.3 mg into the muscle as needed for anaphylaxis.    FLUTICASONE-SALMETEROL (ADVAIR HFA) 115-21 MCG/ACT INHALER    Inhale 2 puffs into the lungs 2 (two) times daily.   INSULIN SYRINGE-NEEDLE U-100 (SAFETY INSULIN SYRINGES) 27G X 1/2" 1 ML MISC    by Does not apply route.   IPRATROPIUM (ATROVENT) 0.06 % NASAL SPRAY    ipratropium bromide 42 mcg (0.06 %) nasal spray   METOPROLOL SUCCINATE (TOPROL-XL) 25 MG 24 HR TABLET    TAKE 1  TABLET BY MOUTH  DAILY   METRONIDAZOLE (METROGEL) 0.75 % GEL    Apply 1 application topically 2 (two) times daily.   NAPROXEN SODIUM (ALEVE) 220 MG TABLET    Take 220 mg by mouth at bedtime as needed (headache).   NEEDLES & SYRINGES MISC    Subcutaneous Syringes and Needles to Inject Vitamin B12 Monthly.   NITROGLYCERIN (NITROSTAT) 0.4 MG SL TABLET    Place 1 tablet (0.4 mg total) under the tongue every 5 (five) minutes as needed for chest pain.   PANTOPRAZOLE (PROTONIX) 40 MG TABLET    Take 40 mg by mouth 2 (two) times daily.   PREGABALIN (LYRICA) 75 MG CAPSULE    Take 75 mg by mouth 2 (two) times daily.   PROBIOTIC PRODUCT (PROBIOTIC PO)    Take 2 capsules by mouth daily.   TRAZODONE (DESYREL) 50 MG TABLET    Take 50 mg by mouth as needed for sleep. Per patient taking 1/4 to 1/2 tablet as needed  Modified Medications   No medications on file  Discontinued Medications   FERROUS SULFATE 325 (65 FE) MG TABLET    Take 325 mg by mouth daily with breakfast.   FLUTICASONE (FLONASE) 50 MCG/ACT NASAL SPRAY    Place 2 sprays into both nostrils daily as needed for allergies.    HYDROXYCHLOROQUINE (PLAQUENIL) 200 MG TABLET    Take 1 tablet by mouth 2 (two) times daily.   LACTOBACILLUS ACIDOPHILUS (BACID) TABS TABLET    Take 1 tablet by mouth 2 (two) times daily.    LEVOTHYROXINE (SYNTHROID) 125 MCG TABLET    Take 125 mcg by mouth at bedtime.    LEVOTHYROXINE (SYNTHROID) 137 MCG TABLET    Take 137 mcg by mouth daily.    Physical  Exam:  Vitals:   08/02/21 0956  Weight: 189 lb 6.4 oz (85.9 kg)  Height: 5\' 5"  (1.651 m)   Body mass index is 31.52 kg/m. Wt Readings from Last 3 Encounters:  08/02/21 189 lb 6.4 oz (85.9 kg)  07/05/21 184 lb 3.2 oz (83.6 kg)  09/07/20 189 lb 3.2 oz (85.8 kg)    Physical Exam Vitals and nursing note reviewed.  Constitutional:      Appearance: Normal appearance. She is obese.  Cardiovascular:     Rate and Rhythm: Normal rate and regular rhythm.  Pulmonary:      Effort: Pulmonary effort is normal.     Breath sounds: Normal breath sounds.  Musculoskeletal:     Comments: Back exam: Straight leg raising is negative Deep tendon reflexes are symmetric at ankle and knee  Neurological:     General: No focal deficit present.     Mental Status: She is alert and oriented to person, place, and time.  Psychiatric:        Mood and Affect: Mood normal.        Behavior: Behavior normal.     Comments: Overall she seems "brighter" today    Labs reviewed: Basic Metabolic Panel: No results for input(s): NA, K, CL, CO2, GLUCOSE, BUN, CREATININE, CALCIUM, MG, PHOS, TSH in the last 8760 hours. Liver Function Tests: No results for input(s): AST, ALT, ALKPHOS, BILITOT, PROT, ALBUMIN in the last 8760 hours. No results for input(s): LIPASE, AMYLASE in the last 8760 hours. No results for input(s): AMMONIA in the last 8760 hours. CBC: No results for input(s): WBC, NEUTROABS, HGB, HCT, MCV, PLT in the last 8760 hours. Lipid Panel: No results for input(s): CHOL, HDL, LDLCALC, TRIG, CHOLHDL, LDLDIRECT in the last 8760 hours. TSH: No results for input(s): TSH in the last 8760 hours. A1C: Lab Results  Component Value Date   HGBA1C 5.6 10/14/2018     Assessment/Plan  1. Fibromyalgia I have given her prednisone and if her back really flares up.  We talked about symptoms to watch for - predniSONE (DELTASONE) 20 MG tablet; 2 po at same time daily for 5 days  Dispense: 10 tablet; Refill: 0  2. Need for influenza vaccination  - Flu Vaccine QUAD High Dose(Fluad)  3. Chronic fatigue syndrome Continue with Abilify as I see some positive results.  She has not had side effects  4. Chronic pain syndrome Taking Flexeril as needed  5. Pernicious anemia We will continue with B12 injections as she has seen some improvement  6. Chronic left-sided low back pain with left-sided sciatica Pain seems to localize to the left SI joint.  There is no tenderness over sciatic  notch.  Concerned this may be some lumbar disc disease as she does have disc disease in her neck by history   Alain Honey, MD [place] [phone]

## 2021-08-04 DIAGNOSIS — Z23 Encounter for immunization: Secondary | ICD-10-CM | POA: Diagnosis not present

## 2021-08-09 ENCOUNTER — Ambulatory Visit: Admit: 2021-08-09 | Discharge: 2021-08-10 | Payer: MEDICARE | Attending: Dermatology | Primary: Dermatology

## 2021-08-09 DIAGNOSIS — L719 Rosacea, unspecified: Principal | ICD-10-CM

## 2021-08-09 DIAGNOSIS — L700 Acne vulgaris: Principal | ICD-10-CM

## 2021-08-09 DIAGNOSIS — L820 Inflamed seborrheic keratosis: Secondary | ICD-10-CM | POA: Diagnosis not present

## 2021-08-09 DIAGNOSIS — D1721 Benign lipomatous neoplasm of skin and subcutaneous tissue of right arm: Secondary | ICD-10-CM | POA: Diagnosis not present

## 2021-08-09 DIAGNOSIS — L281 Prurigo nodularis: Secondary | ICD-10-CM | POA: Diagnosis not present

## 2021-08-09 DIAGNOSIS — D1722 Benign lipomatous neoplasm of skin and subcutaneous tissue of left arm: Secondary | ICD-10-CM | POA: Diagnosis not present

## 2021-08-09 MED ORDER — DOXYCYCLINE HYCLATE 20 MG TABLET
ORAL_TABLET | Freq: Two times a day (BID) | ORAL | 2 refills | 30.00000 days | Status: CP
Start: 2021-08-09 — End: ?

## 2021-08-10 DIAGNOSIS — L501 Idiopathic urticaria: Secondary | ICD-10-CM | POA: Diagnosis not present

## 2021-08-10 MED ORDER — METRONIDAZOLE 0.75 % TOPICAL GEL
TOPICAL | 5 refills | 0.00000 days | Status: CP
Start: 2021-08-10 — End: ?

## 2021-08-15 DIAGNOSIS — M5451 Vertebrogenic low back pain: Secondary | ICD-10-CM | POA: Diagnosis not present

## 2021-08-17 ENCOUNTER — Ambulatory Visit (HOSPITAL_BASED_OUTPATIENT_CLINIC_OR_DEPARTMENT_OTHER): Payer: Medicare Other | Attending: Orthopedic Surgery | Admitting: Physical Therapy

## 2021-08-17 ENCOUNTER — Encounter (HOSPITAL_BASED_OUTPATIENT_CLINIC_OR_DEPARTMENT_OTHER): Payer: Self-pay | Admitting: Physical Therapy

## 2021-08-17 ENCOUNTER — Other Ambulatory Visit: Payer: Self-pay

## 2021-08-17 DIAGNOSIS — M6281 Muscle weakness (generalized): Secondary | ICD-10-CM

## 2021-08-17 DIAGNOSIS — M79605 Pain in left leg: Secondary | ICD-10-CM

## 2021-08-17 DIAGNOSIS — R262 Difficulty in walking, not elsewhere classified: Secondary | ICD-10-CM

## 2021-08-17 DIAGNOSIS — M545 Low back pain, unspecified: Secondary | ICD-10-CM

## 2021-08-17 DIAGNOSIS — G8929 Other chronic pain: Secondary | ICD-10-CM

## 2021-08-17 DIAGNOSIS — M5451 Vertebrogenic low back pain: Secondary | ICD-10-CM | POA: Insufficient documentation

## 2021-08-17 NOTE — Therapy (Signed)
OUTPATIENT PHYSICAL THERAPY THORACOLUMBAR EVALUATION   Patient Name: [name] MRN: [id number] DOB:01-11-1956, 65 y.o., female Today's Date: 08/18/2021   PT End of Session - 08/17/21 2259     Visit Number 1    Number of Visits 12    Date for PT Re-Evaluation 09/28/21    Progress Note Due on Visit --   09/17/2021   PT Start Time 1228    PT Stop Time 1320    PT Time Calculation (min) 52 min    Activity Tolerance Patient tolerated treatment well    Behavior During Therapy [facility] for tasks assessed/performed             Past Medical History:  Diagnosis Date   Asthma    Back pain    Chronic fatigue syndrome    Dr. Sabra Heck   Complication of anesthesia    Cough    /SOB, PFT's nl, improved with Bronchodilation 8/11   Dysrhythmia    Endometrial cancer ([place])    Esophageal spasm    GERD Dr. Sabra Heck, Dr. Wynetta Emery; egd 2006 nl; UGI 3/13 Small HH, moderate GERD, nl motility; seen at [facility] ([place]), egd? neg, sone improvement on sucralfate susp   Fibromyalgia    Foot pain    Dr. Rushie Nyhan   Hashimoto's thyroiditis    High triglycerides    Hypothyroidism    Dr. Lewis Shock 4/14   Leukocytoclastic vasculitis ([place])    Dr. Tonia Brooms   Low HDL (under 40)    Memory loss    Mitral valve prolapse    Paroxysmal atrial tachycardia ([place])    Dr. Marlou Porch   Pernicious anemia    Dr. Sabra Heck   PONV (postoperative nausea and vomiting)    along time ago had nausea ans vomiting after surgery-25 years ago   Reflux    ? delayed gastric emptying--GES nl 01/2012 (6% at 2hrs)   RLS (restless legs syndrome)    low ferritin Dr. Sabra Heck   Rosacea    Sleep apnea    Uterine carcinoma ([place])    Dr. Polly Cobia   Past Surgical History:  Procedure Laterality Date   ABDOMINAL HYSTERECTOMY     bladder tack     x 2   BUNIONECTOMY     CHOLECYSTECTOMY  2009   COLONOSCOPY     scr, 12/2008 ([facility]) nl   COLOSTOMY Left 06/05/2018   Procedure: COLOSTOMY;  Surgeon: Ralene Ok, MD;  Location: [place];  Service:  General;  Laterality: Left;   COLOSTOMY REVERSAL N/A 10/16/2018   Procedure: HARTMANN'S COLOSTOMY REVERSAL, RIGID PROCTOSCOPY;  Surgeon: Ralene Ok, MD;  [facility];  Service: General;  Laterality: N/A;   HAMMER TOE SURGERY     HYSTERECTOMY ABDOMINAL WITH SALPINGO-OOPHORECTOMY  2008   [place]  04/09/2019   x2   [place] N/A 04/09/2019   Procedure: INCISIONAL HERNIA REPAIR WITH MESH;  Surgeon: Ralene Ok, MD;  Location: [place];  Service: General;  Laterality: N/A;   LAPAROSCOPY N/A 10/16/2018   Procedure: LAPAROSCOPY DIAGNOSTIC WITH LYSIS OF ADHESIONS;  Surgeon: Ralene Ok, MD;  [facility];  Service: General;  Laterality: N/A;   LUNG BIOPSY     L Lower lung pulm nodules, largest 4.64mm, low risk, repeat CT in 1 yr now following with pulm at [facility]; 9/13 Stable tiny lung nodules, no f/u needed   LYMPH NODE DISSECTION  2008   LYSIS OF ADHESION N/A 04/09/2019   Procedure: OPEN LYSIS OF ADHESION;  Surgeon: Ralene Ok, MD;  [facility];  Service: General;  Laterality: N/A;   METATARSAL OSTEOTOMY     NECK SURGERY     x 2   PARTIAL COLECTOMY N/A 06/05/2018   Procedure: EXPLORATORY LAPAROTOMY AND PARTIAL COLECTOMY;  Surgeon: Ralene Ok, MD;  Location: [place];  Service: General;  Laterality: N/A;   TONSILLECTOMY     US ECHOCARDIOGRAPHY     with Nuclear test, Dr. Marlou Porch, Low risk   VESICOVAGINAL FISTULA CLOSURE W/ TAH     Patient Active Problem List   Diagnosis Date Noted   Back pain    Fibromyalgia    S/P hernia repair 04/09/2019   S/P colostomy takedown 10/16/2018   Sigmoid volvulus ([place]) 06/07/2018   Bowel perforation ([place]) 06/07/2018   Anemia 06/07/2018   Hypokalemia 06/07/2018   Hypophosphatemia 06/07/2018   AKI (acute kidney injury) ([place])    Leukocytosis (leucocytosis) 06/04/2018   Fecal impaction ([place]) 06/03/2018   Enterocolitis 06/03/2018   Asthma 06/03/2018   Chronic pain syndrome 06/03/2018   Chronic  fatigue syndrome 11/28/2017   Pernicious anemia 11/28/2017   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  Pain in Lumbar Spine.  Vertebrogenic LBP  THERAPY DIAG:  Chronic LBP M54.50 Pain in Left Leg  M79.605 Muscle weakness M62.81 Joint stiffness in lumbar Difficulty in walking R26.2  ONSET DATE: Chronic pain though exacerbation in early October  SUBJECTIVE:                                                                                                                                                                                           SUBJECTIVE STATEMENT: -Pt reports having lumbar pain for years with an exacerbation in August 2022.  Pt fell in her home in early August though had pain in late August.  Her pain improved in September though exacerbated 7-10 days ago with no specific reason.  Pt saw Dr. Rolena Infante on Monday and had an x ray.  Pt had to use a walker due to pain and feeling unstable.  He prescribed prednisone and Gabapentin.  MD ordered PT and referral indicated 2-3x/wk x 3-4 weeks Aquatic Therapy -Pt had prior land based and aquatic PT for back pain in 2019.  She responded well to PT and states she responded better to aquatic than land PT.  In the past, she has received prednisone and ESI's.   -Pt reports she has pain with bending over.  She has pain with lifting including picking  up her dog.  She has pain with quickly turning with gait.  Pt is limited with performing household chores due to pain.  Pt has increased pain with standing and is not cooking.  Pt has difficulty sleeping due to pain.  Pt is limited with ambulation, but thinks it's more related to her chronic fatigue syndrome than her back.  Pt has difficulty with performing stairs including applying weight thru L LE.        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 Yes Pain scale: 7.5/10, 9/10 worst pain, 3-4/10 best Pain location: L glute/hip/SI which travels to lateral hip and down lateral thigh to L foot Pain orientation: Left  PAIN TYPE: aching, dull, sharp, and throbbing Pain description: constant  Aggravating factors: bending over, lifting, quick turning with gait Relieving factors: meds, heat  PRECAUTIONS: Other: chronic fatigue syndrome, fibromyalgia, cervical disc replacement and fusion  WEIGHT BEARING RESTRICTIONS No  FALLS:  Has patient fallen in last 6 months? Yes, Number of falls: 1  LIVING ENVIRONMENT: Lives with: lives with their spouse Lives in: 1 story home Stairs: 3 steps with bilat rails Has following equipment at home:  2 canes and walker.  OCCUPATION: Pt is retired  PLOF: Independent.  Pt has chronic lumbar pain though reports she was able to perform her daily activities and functional mobility with much less pain and improved ease prior to this exacerbation.  Pt had covid in January and staets she never returned to her full capacity.  She reports returning to about 70% of her normal.   PATIENT GOALS reduce pain or eliminate pain.  Improve activity tolerance.  To be able to perform her normal household chores.  Sleep a whole night thru without pain waking her up.    OBJECTIVE:   DIAGNOSTIC FINDINGS:  Unable to see x ray findings that were taken at MD office.   PATIENT SURVEYS:  Modified Oswestry 60%    COGNITION:  Overall cognitive status: Within functional limits for tasks assessed     SENSATION:  Light touch: 2+ sensation to LT t/o bilat LE dermatomes   PALPATION:  TTP at L glute, L PSIS, lateral hip and thigh  MUSCLE LENGTH: Hamstrings: Tightness in  bilat   LUMBARAROM/PROM  A/PROM AROM  08/18/2021  Flexion 60%  Extension WFL  Right lateral flexion 90%  Left lateral flexion 60%  Right rotation 50%  Left rotation 40%   (Blank rows = not tested) Pain with Lumbar AROM testing   LE AROM/PROM:  A/PROM Right 08/18/2021 Left 08/18/2021  Hip flexion    Hip extension    Hip abduction    Hip adduction    Hip internal rotation    Hip external rotation    Knee flexion    Knee extension    Ankle dorsiflexion    Ankle plantarflexion    Ankle inversion    Ankle eversion     (Blank rows = not tested)  LE MMT:  MMT Right 08/18/2021 Left 08/18/2021  Hip flexion 5/5 5/5 with pain  Hip extension    Hip abduction    Hip adduction    Hip internal rotation    Hip external rotation 5/5 5/5  Knee flexion 5/5 tested in sitting 4+/5 tested in sitting  Knee extension 5/5 5/5 with pain  Ankle dorsiflexion 4+/5 4+/5  Ankle plantarflexion Able to tolerate good resistance though is shaky Able to tolerate good resistance though is shaky  Ankle inversion    Ankle eversion     (Blank rows = not tested)  LUMBAR SPECIAL TESTS:  Supine SLR test and Slump Test:  negative bilat   GAIT: Assistive device utilized: None Level of assistance: Complete Independence Comments: pt ambulates with a stiff gait pattern, decreased pelvic rotation bilat, and decreased reciprocal arm swing.    TODAY'S TREATMENT  See below for pt education   PATIENT EDUCATION:  Education details: Educated pt concerning POC.  Answered Pt's questions.  Educated pt concerning aquatic process and the benefits and purpose of aquatic therapy.  Person educated: Patient and Spouse Education method: Explanation Education comprehension: verbalized understanding   HOME EXERCISE PROGRAM: Will gave at a later date  ASSESSMENT:  CLINICAL IMPRESSION: Patient is a 65 y.o. female with a dx of vertebrogenic LBP having a hx of chronic back pain with a recent  exacerbation.  Pt presents to the clinic with LBP and L LE pain, limited lumbar AROM, muscle weakness, and difficulty in walking.  Pt has received PT in the past including aquatic therapy and responded well.  Pt is limited with standing activities and performing household chores due to pain.  Pt has difficulty sleeping due to pain.  Pt is limited with ambulation, but thinks it's more related to her chronic fatigue syndrome than her back.   Pt has pain with bending and lifting.  Pt also has difficulty with performing stairs including applying weight thru L LE.  Objective impairments include Abnormal gait, decreased activity tolerance, decreased endurance, decreased mobility, difficulty walking, decreased ROM, decreased strength, hypomobility, impaired flexibility, and pain. These impairments are limiting patient from cleaning, meal prep, laundry, and ambulation and standing activities . Personal factors including 3+ comorbidities: Chronic fatigue syndrome, fibromyalgia, chronic pain, and cervical disc replacement and fusion  are also affecting patient's functional outcome. Patient should benefit from skilled PT to address above impairments and improve overall function.  REHAB POTENTIAL: Good  CLINICAL DECISION MAKING: Evolving/moderate complexity  EVALUATION COMPLEXITY: Moderate   GOALS:   SHORT TERM GOALS:  STG Name Target Date Goal status  1 Pt will tolerate aquatic therapy without adverse effects in order to improve pain, strength, mobility, and tolerance to activity.  Baseline:  09/07/2021 INITIAL  2 Pt will demo improved lumbar SB and Rotation AROM to be [facility] bilat for improved mobility and stiffness.  Baseline:  09/14/2021 INITIAL  3 Pt will be able to stand to cook without significant pain. Baseline: 09/14/2021  INITIAL  4 Pt's worst pain will be less than 8/10 for improved tolerance to activity Baseline: 09/07/2021 INITIAL  5 Pt will be independent with land based HEP for improved pain,  strength, ROM, and function.  Baseline: 09/07/2021 INITIAL             LONG TERM GOALS:   LTG Name Target Date Goal status  1 Pt will be able to apply weight thru L LE with stairs without increased pain and perform stairs with a reciprocal gait with rails.  Baseline: 11/31/2022 INITIAL  2 Pt will be able to perform her normal household chores without significant pain or difficulty.   Baseline: 11/31/2022 INITIAL  3 Pt will demo proper body mechanics with squat to lift/hip hinge technique for improved functional lifting including picking up her dog. Baseline: 11/31/2022 INITIAL  4 Pt will be able to turn with gait without increased pain. Baseline:  11/31/2022 INITIAL  5 Pt will demo improved core strength by progressing with aquatic exercises and land based core exercises without adverse effects for improved performance of and tolerance with functional mobility skills and daily activities.    Baseline: 11/31/2022 INITIAL  6 Pt will report at least a 60-70% improvement overall in pain and sx's.   Baseline: 11/31/2022 INITIAL        PLAN: PT FREQUENCY:  2-3x/wk x 2-3 weeks and 2x/wk afterwards  PT DURATION: other: 4-6 weeks  PLANNED INTERVENTIONS: Therapeutic exercises, Therapeutic activity, Neuro Muscular re-education, Gait training, Patient/Family education, Joint mobilization, Stair training, Aquatic Therapy, Dry Needling, Electrical stimulation, Spinal mobilization, Cryotherapy, Moist heat, Taping, Ultrasound, and Manual therapy  PLAN FOR NEXT SESSION: Aquatic therapy for the next 2 visits than land therapy to follow.   Selinda Michaels III PT, DPT 08/18/21 2:33 PM

## 2021-08-22 DIAGNOSIS — J3081 Allergic rhinitis due to animal (cat) (dog) hair and dander: Secondary | ICD-10-CM | POA: Diagnosis not present

## 2021-08-22 DIAGNOSIS — J4540 Moderate persistent asthma, uncomplicated: Secondary | ICD-10-CM | POA: Diagnosis not present

## 2021-08-22 DIAGNOSIS — Z91010 Allergy to peanuts: Secondary | ICD-10-CM | POA: Diagnosis not present

## 2021-08-22 DIAGNOSIS — J3089 Other allergic rhinitis: Secondary | ICD-10-CM | POA: Diagnosis not present

## 2021-08-23 ENCOUNTER — Other Ambulatory Visit: Payer: Self-pay

## 2021-08-23 ENCOUNTER — Encounter (HOSPITAL_BASED_OUTPATIENT_CLINIC_OR_DEPARTMENT_OTHER): Payer: Medicare Other | Attending: Physical Therapy | Admitting: Physical Therapy

## 2021-08-23 ENCOUNTER — Encounter (HOSPITAL_BASED_OUTPATIENT_CLINIC_OR_DEPARTMENT_OTHER): Payer: Self-pay | Admitting: Physical Therapy

## 2021-08-23 DIAGNOSIS — M25561 Pain in right knee: Secondary | ICD-10-CM | POA: Diagnosis not present

## 2021-08-23 DIAGNOSIS — R262 Difficulty in walking, not elsewhere classified: Secondary | ICD-10-CM | POA: Insufficient documentation

## 2021-08-23 DIAGNOSIS — G8929 Other chronic pain: Secondary | ICD-10-CM | POA: Diagnosis not present

## 2021-08-23 DIAGNOSIS — R293 Abnormal posture: Secondary | ICD-10-CM | POA: Diagnosis not present

## 2021-08-23 DIAGNOSIS — R2689 Other abnormalities of gait and mobility: Secondary | ICD-10-CM | POA: Diagnosis not present

## 2021-08-23 DIAGNOSIS — R2681 Unsteadiness on feet: Secondary | ICD-10-CM | POA: Insufficient documentation

## 2021-08-23 NOTE — Therapy (Signed)
OUTPATIENT PHYSICAL THERAPY TREATMENT NOTE   Patient Name: [name] MRN: [id number] DOB:Aug 14, 1956, 65 y.o., female Today's Date: 08/23/2021  PCP: Wardell Honour, MD REFERRING PROVIDER: Wardell Honour, MD   PT End of Session - 08/23/21 1616     Visit Number 2    Number of Visits 12    Date for PT Re-Evaluation 09/28/21    Progress Note Due on Visit --   09/17/2021   PT Start Time 1616    PT Stop Time 1700    PT Time Calculation (min) 44 min    Equipment Utilized During Treatment Other (comment)   Ankle cuff and waist buoys, kick board, noodle/sqoodle, nekdoodle, weights and barbells, water walker   Activity Tolerance Patient tolerated treatment well    Behavior During Therapy WFL for tasks assessed/performed             Past Medical History:  Diagnosis Date   Asthma    Back pain    Chronic fatigue syndrome    Dr. Sabra Heck   Complication of anesthesia    Cough    /SOB, PFT's nl, improved with Bronchodilation 8/11   Dysrhythmia    Endometrial cancer ([place])    Esophageal spasm    GERD Dr. Sabra Heck, Dr. Wynetta Emery; egd 2006 nl; UGI 3/13 Small HH, moderate GERD, nl motility; seen at [facility] ([place]), egd? neg, sone improvement on sucralfate susp   Fibromyalgia    Foot pain    Dr. Rushie Nyhan   Hashimoto's thyroiditis    High triglycerides    Hypothyroidism    Dr. Lewis Shock 4/14   Leukocytoclastic vasculitis ([place])    Dr. Tonia Brooms   Low HDL (under 40)    Memory loss    Mitral valve prolapse    Paroxysmal atrial tachycardia ([place])    Dr. Marlou Porch   Pernicious anemia    Dr. Sabra Heck   PONV (postoperative nausea and vomiting)    along time ago had nausea ans vomiting after surgery-25 years ago   Reflux    ? delayed gastric emptying--GES nl 01/2012 (6% at 2hrs)   RLS (restless legs syndrome)    low ferritin Dr. Sabra Heck   Rosacea    Sleep apnea    Uterine carcinoma ([place])    Dr. Polly Cobia   Past Surgical History:  Procedure Laterality Date   ABDOMINAL HYSTERECTOMY      bladder tack     x 2   BUNIONECTOMY     CHOLECYSTECTOMY  2009   COLONOSCOPY     scr, 12/2008 ([facility]) nl   COLOSTOMY Left 06/05/2018   Procedure: COLOSTOMY;  Surgeon: Ralene Ok, MD;  Location: [place];  Service: General;  Laterality: Left;   COLOSTOMY REVERSAL N/A 10/16/2018   Procedure: HARTMANN'S COLOSTOMY REVERSAL, RIGID PROCTOSCOPY;  Surgeon: Ralene Ok, MD;  [facility];  Service: General;  Laterality: N/A;   HAMMER TOE SURGERY     HYSTERECTOMY ABDOMINAL WITH SALPINGO-OOPHORECTOMY  2008   INCISIONAL HERNIA REPAIR  04/09/2019   x2   [place] N/A 04/09/2019   Procedure: INCISIONAL HERNIA REPAIR WITH MESH;  Surgeon: Ralene Ok, MD;  Location: [place];  Service: General;  Laterality: N/A;   LAPAROSCOPY N/A 10/16/2018   Procedure: LAPAROSCOPY DIAGNOSTIC WITH LYSIS OF ADHESIONS;  Surgeon: Ralene Ok, MD;  [facility];  Service: General;  Laterality: N/A;   LUNG BIOPSY     L Lower lung pulm nodules, largest 4.70mm, low risk, repeat CT in 1 yr now following with pulm at  [facility]; 9/13 Stable tiny lung nodules, no f/u needed   LYMPH NODE DISSECTION  2008   LYSIS OF ADHESION N/A 04/09/2019   Procedure: OPEN LYSIS OF ADHESION;  Surgeon: Ralene Ok, MD;  Location: [place];  Service: General;  Laterality: N/A;   METATARSAL OSTEOTOMY     NECK SURGERY     x 2   PARTIAL COLECTOMY N/A 06/05/2018   Procedure: EXPLORATORY LAPAROTOMY AND PARTIAL COLECTOMY;  Surgeon: Ralene Ok, MD;  Location: [place];  Service: General;  Laterality: N/A;   TONSILLECTOMY     US ECHOCARDIOGRAPHY     with Nuclear test, Dr. Marlou Porch, Low risk   VESICOVAGINAL FISTULA CLOSURE W/ TAH     Patient Active Problem List   Diagnosis Date Noted   Back pain    Fibromyalgia    S/P hernia repair 04/09/2019   S/P colostomy takedown 10/16/2018   Sigmoid volvulus ([place]) 06/07/2018   Bowel perforation ([place]) 06/07/2018   Anemia 06/07/2018   Hypokalemia 06/07/2018   Hypophosphatemia  06/07/2018   AKI (acute kidney injury) ([employer])    Leukocytosis (leucocytosis) 06/04/2018   Fecal impaction ([place]) 06/03/2018   Enterocolitis 06/03/2018   Asthma 06/03/2018   Chronic pain syndrome 06/03/2018   Chronic fatigue syndrome 11/28/2017   Pernicious anemia 11/28/2017   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  Pain in Lumbar Spine.  Vertebrogenic LBP  THERAPY DIAG:  Abnormal posture  Chronic pain of right knee  Difficulty in walking, not elsewhere classified  Other abnormalities of gait and mobility  Unsteadiness on feet   SUBJECTIVE: "I could barely get out of bed this morning the pain in my left LB and buttock/hip was so bad 9/10"  PAIN:  Are you having pain? Yes VAS scale: 5/10 Pain location: L glute/hip/SI which travels to lateral hip and down lateral thigh to L foot Pain orientation: Left  PAIN TYPE: aching, dull, sharp, throbbing Pain description: constant  Aggravating factors: bending over, lifting, quick turning with gait Relieving factors: meds, heat   PERTINENT HISTORY:  Chronic fatigue syndrome with PEM (post exertional malaise), fibromyalgia, Cervical surgery x 2 (disc replacement and fusion), neuropathy in bilat feet, SVT, hernia repair in 2020      PRECAUTIONS: Other: chronic fatigue syndrome, fibromyalgia, cervical disc replacement and fusion     HOME EXERCISE PROGRAM: Glute stretches   ASSESSMENT:   CLINICAL IMPRESSION:  Pt introduced to aquatic setting. She demonstrates confidence although voices that she does not know how to swim. Pain 5/10 upon initiation of session. With palpation pt with muscle tightness and tenderness in left sciatic notch area. With slight pressure pt reports sensation extending into leg. She tolerates Bad Ragaz technique well with added LB flex stretching and is well coordinated with PPT.  Completed glute stretching in sitting which stretch aggravated area well. Upon completion pt reports being a little soar but not as soar as when she enter the session. I was able to schedule her for a 2nd visit this week.    GOALS:     SHORT TERM GOALS:   STG Name Target Date Goal status  1 Pt will tolerate aquatic therapy without adverse effects in order to improve pain, strength, mobility, and tolerance to activity.  Baseline:  09/07/2021 INITIAL  2 Pt will demo improved lumbar SB and Rotation AROM to be [facility] bilat for improved mobility and stiffness.  Baseline:  09/14/2021 INITIAL  3 Pt will be able to stand to cook without significant pain. Baseline: 09/14/2021   INITIAL  4 Pt's worst pain will be less than 8/10 for improved tolerance to activity Baseline: 09/07/2021 INITIAL  5 Pt will be independent with land based HEP for improved pain, strength, ROM, and function.  Baseline: 09/07/2021 INITIAL                      LONG TERM GOALS:    LTG Name Target Date Goal status  1 Pt will be able to apply weight thru L LE with stairs without increased pain and perform stairs with a reciprocal gait with rails.  Baseline: 11/31/2022 INITIAL  2 Pt will be able to perform her normal household chores without significant pain or difficulty.   Baseline: 11/31/2022 INITIAL  3 Pt will demo proper body mechanics with squat to lift/hip hinge technique for improved functional lifting including picking up her dog. Baseline: 11/31/2022 INITIAL  4 Pt will be able to turn with gait without increased pain. Baseline: 11/31/2022 INITIAL  5 Pt will demo improved core strength by progressing with aquatic exercises and land based core exercises without adverse effects for improved performance of and tolerance with functional mobility skills and daily activities.    Baseline: 11/31/2022 INITIAL  6 Pt will report at least a 60-70% improvement overall in pain and sx's.   Baseline: 11/31/2022 INITIAL              PLAN: PT FREQUENCY:  2-3x/wk x 2-3 weeks and 2x/wk afterwards   PT DURATION: other: 4-6 weeks   PLANNED INTERVENTIONS: Therapeutic exercises, Therapeutic activity, Neuro Muscular re-education, Gait training, Patient/Family education, Joint mobilization, Stair training, Aquatic Therapy, Dry Needling, Electrical stimulation, Spinal mobilization, Cryotherapy, Moist heat, Taping, Ultrasound, and Manual therapy        TODAY'S TREATMENT: 08/23/21 Pt seen for aquatic therapy today.  Treatment took place in water 3.25-4.8 ft in depth at the [employer] pool. Temp of water was 92.  Pt entered/exited the pool via stairs step to pattern independently with bilat rail.  Pt entered water for aquatic therapy for first time and was introduced to principles and therapeutic effects of water as she ambulated and acclimated to pool. Pt ambulates forward across pool , then backwards to engage posterior chain and then sidesteping x 3-4 widths each.   Bad Ragaz, Pt with lumbar belt around hips and nek doodle for neck support.  .  Pt assisted into supine floating position by lying head on shoulder of PT to get into floating position. . PT at torso and assisting with trunk left to right and vice versa to engage and stretch trunk muscles. PT then rotated trunk in order to engage abdomnal (internal and external obliques)  Emphasis on breathing techniques to draw in abdominals for support.  -PPT x 10 -knees to chest therapist securing at pt feet x 5, 30 sec hold for LB stretch  -hip knee flex/ext in above position x 10  Seated Glute stretch R/L x 4 30 sec hold  Pt requires buoyancy for support and to offload joints with strengthening exercises. Viscosity of the water is needed for resistance of strengthening; water current perturbations provides challenge to standing balance unsupported, requiring increased core activation.   PATIENT EDUCATION:  Properties of water, benefits of aquatic therapy.  PLAN  FOR NEXT SESSION: Toleration of last session; stretching and gentle strengthening  Stanton Kidney ([place]) Latunya Kissick MPT  08/23/2021, 5:36 PM

## 2021-08-26 ENCOUNTER — Encounter (HOSPITAL_BASED_OUTPATIENT_CLINIC_OR_DEPARTMENT_OTHER): Payer: Self-pay | Admitting: Physical Therapy

## 2021-08-26 ENCOUNTER — Other Ambulatory Visit: Payer: Self-pay

## 2021-08-26 ENCOUNTER — Ambulatory Visit (HOSPITAL_BASED_OUTPATIENT_CLINIC_OR_DEPARTMENT_OTHER): Payer: Medicare Other | Admitting: Physical Therapy

## 2021-08-26 DIAGNOSIS — R262 Difficulty in walking, not elsewhere classified: Secondary | ICD-10-CM

## 2021-08-26 DIAGNOSIS — G8929 Other chronic pain: Secondary | ICD-10-CM

## 2021-08-26 DIAGNOSIS — M6281 Muscle weakness (generalized): Secondary | ICD-10-CM

## 2021-08-26 DIAGNOSIS — M5451 Vertebrogenic low back pain: Secondary | ICD-10-CM | POA: Diagnosis not present

## 2021-08-26 DIAGNOSIS — R2681 Unsteadiness on feet: Secondary | ICD-10-CM

## 2021-08-26 DIAGNOSIS — R2689 Other abnormalities of gait and mobility: Secondary | ICD-10-CM

## 2021-08-26 DIAGNOSIS — R293 Abnormal posture: Secondary | ICD-10-CM

## 2021-08-26 NOTE — Therapy (Signed)
OUTPATIENT PHYSICAL THERAPY TREATMENT NOTE   Patient Name: Jill CASCO MRN: [id number] DOB:01/25/1956, 65 y.o., female Today's Date: 08/28/2021  PCP: Wardell Honour, MD REFERRING PROVIDER: Wardell Honour, MD  3/12 visits Re-eval: 09/28/21 Start:905a Stop:950a Time:45 min Pt tolerated treatment well; [name] for tasks assessed/performed   Past Medical History:  Diagnosis Date   Asthma    Back pain    Chronic fatigue syndrome    Dr. Sabra Heck   Complication of anesthesia    Cough    /SOB, PFT's nl, improved with Bronchodilation 8/11   Dysrhythmia    Endometrial cancer ([place])    Esophageal spasm    GERD Dr. Sabra Heck, Dr. Wynetta Emery; egd 2006 nl; UGI 3/13 Small HH, moderate GERD, nl motility; seen at [facility] ([place]), egd? neg, sone improvement on sucralfate susp   Fibromyalgia    Foot pain    Dr. Rushie Nyhan   Hashimoto's thyroiditis    High triglycerides    Hypothyroidism    Dr. Lewis Shock 4/14   Leukocytoclastic vasculitis ([place])    Dr. Tonia Brooms   Low HDL (under 40)    Memory loss    Mitral valve prolapse    Paroxysmal atrial tachycardia ([place])    Dr. Marlou Porch   Pernicious anemia    Dr. Sabra Heck   PONV (postoperative nausea and vomiting)    along time ago had nausea ans vomiting after surgery-25 years ago   Reflux    ? delayed gastric emptying--GES nl 01/2012 (6% at 2hrs)   RLS (restless legs syndrome)    low ferritin Dr. Sabra Heck   Rosacea    Sleep apnea    Uterine carcinoma ([place])    Dr. Polly Cobia   Past Surgical History:  Procedure Laterality Date   ABDOMINAL HYSTERECTOMY     bladder tack     x 2   BUNIONECTOMY     CHOLECYSTECTOMY  2009   COLONOSCOPY     scr, 12/2008 ([facility]) nl   COLOSTOMY Left 06/05/2018   Procedure: COLOSTOMY;  Surgeon: Ralene Ok, MD;  Location: [place];  Service: General;  Laterality: Left;   COLOSTOMY REVERSAL N/A 10/16/2018   Procedure: HARTMANN'S COLOSTOMY REVERSAL, RIGID PROCTOSCOPY;  Surgeon: Ralene Ok, MD;  [facility];  Service:  General;  Laterality: N/A;   HAMMER TOE SURGERY     HYSTERECTOMY ABDOMINAL WITH SALPINGO-OOPHORECTOMY  2008   [name]  04/09/2019   x2   [place] N/A 04/09/2019   Procedure: INCISIONAL HERNIA REPAIR WITH MESH;  Surgeon: Ralene Ok, MD;  Location: [place];  Service: General;  Laterality: N/A;   LAPAROSCOPY N/A 10/16/2018   Procedure: LAPAROSCOPY DIAGNOSTIC WITH LYSIS OF ADHESIONS;  Surgeon: Ralene Ok, MD;  [facility];  Service: General;  Laterality: N/A;   LUNG BIOPSY     L Lower lung pulm nodules, largest 4.51mm, low risk, repeat CT in 1 yr now following with pulm at  [facility]; 9/13 Stable tiny lung nodules, no f/u needed   LYMPH NODE DISSECTION  2008   LYSIS OF ADHESION N/A 04/09/2019   Procedure: OPEN LYSIS OF ADHESION;  Surgeon: Ralene Ok, MD;  Location: [place];  Service: General;  Laterality: N/A;   METATARSAL OSTEOTOMY     NECK SURGERY     x 2   PARTIAL COLECTOMY N/A 06/05/2018   Procedure: EXPLORATORY LAPAROTOMY AND PARTIAL COLECTOMY;  Surgeon: Ralene Ok, MD;  Location: [place];  Service: General;  Laterality: N/A;   TONSILLECTOMY     US ECHOCARDIOGRAPHY  with Nuclear test, Dr. Marlou Porch, Low risk   VESICOVAGINAL FISTULA CLOSURE W/ TAH     Patient Active Problem List   Diagnosis Date Noted   Back pain    Fibromyalgia    S/P hernia repair 04/09/2019   S/P colostomy takedown 10/16/2018   Sigmoid volvulus ([place]) 06/07/2018   Bowel perforation ([place]) 06/07/2018   Anemia 06/07/2018   Hypokalemia 06/07/2018   Hypophosphatemia 06/07/2018   AKI (acute kidney injury) ([place])    Leukocytosis (leucocytosis) 06/04/2018   Fecal impaction ([place]) 06/03/2018   Enterocolitis 06/03/2018   Asthma 06/03/2018   Chronic pain syndrome 06/03/2018   Chronic fatigue syndrome 11/28/2017   Pernicious anemia 11/28/2017   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  Pain in Lumbar Spine.  Vertebrogenic LBP  THERAPY DIAG:  Abnormal posture  Other abnormalities of gait and mobility  Chronic pain of right knee  Unsteadiness on feet  Difficulty in walking, not elsewhere classified  Muscle weakness (generalized)   SUBJECTIVE: "after last visit I slept really well, all night in my bed, next night was back to sleeping in chair"  PAIN:  Are you having pain? 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  Left hip extension in supine increases left buttock pain with radiating pain in posterior leg to knee. Gentle massage to area not tolerated.  Able to gain good stretching and strengthening of lateral core with Bad Ragaz as well as LB with knees to chest in sup suspension position. Stretching of glute at initiation for treatment as well as at the end.  Pt may benefit from DN in area as she has had success in use of treatment in past.  Will discuss with on land therapist.      GOALS:     SHORT TERM GOALS:   STG Name Target Date Goal status  1 Pt will tolerate aquatic therapy without adverse effects in order to improve pain, strength, mobility, and tolerance to activity.  Baseline:   09/07/2021 INITIAL  2 Pt will demo improved lumbar SB and Rotation AROM to be [facility] bilat for improved mobility and stiffness.  Baseline:  09/14/2021 INITIAL  3 Pt will be able to stand to cook without significant pain. Baseline: 09/14/2021   INITIAL  4 Pt's worst pain will be less than 8/10 for improved tolerance to activity Baseline: 09/07/2021 INITIAL  5 Pt will be independent with land based HEP for improved pain, strength, ROM, and function.  Baseline: 09/07/2021 INITIAL                      LONG TERM GOALS:    LTG Name Target Date Goal status  1 Pt will be able to apply  weight thru L LE with stairs without increased pain and perform stairs with a reciprocal gait with rails.  Baseline: 11/31/2022 INITIAL  2 Pt will be able to perform her normal household chores without significant pain or difficulty.   Baseline: 11/31/2022 INITIAL  3 Pt will demo proper body mechanics with squat to lift/hip hinge technique for improved functional lifting including picking up her dog. Baseline: 11/31/2022 INITIAL  4 Pt will be able to turn with gait without increased pain. Baseline: 11/31/2022 INITIAL  5 Pt will demo improved core strength by progressing with aquatic exercises and land based core exercises without adverse effects for improved performance of and tolerance with functional mobility skills and daily activities.    Baseline: 11/31/2022 INITIAL  6 Pt will report at least a 60-70% improvement overall in pain and sx's.   Baseline: 11/31/2022 INITIAL             PLAN: PT FREQUENCY:  2-3x/wk x 2-3 weeks and 2x/wk afterwards   PT DURATION: other: 4-6 weeks   PLANNED INTERVENTIONS: Therapeutic exercises, Therapeutic activity, Neuro Muscular re-education, Gait training, Patient/Family education, Joint mobilization, Stair training, Aquatic Therapy, Dry Needling, Electrical stimulation, Spinal mobilization, Cryotherapy, Moist heat, Taping, Ultrasound, and Manual therapy        TODAY'S  TREATMENT:  08/26/21  Pt seen for aquatic therapy today.  Treatment took place in water 3.25-4.8 ft in depth at the [facility] pool. Temp of water was 92.  Pt entered/exited the pool via stairs step to pattern independently with bilat rail.  Pt entered water for aquatic therapy for first time and was introduced to principles and therapeutic effects of water as she ambulated and acclimated to pool. Pt ambulates forward across pool , then backwards to engage posterior chain and then sidesteping x 3-4 widths each.   Seated Stretching:  Glute stretch R/L x 4, 30 sec hold  Hamstring, gastroc  and adductor stretch 3 x 20-25 sec hold  Bad Ragaz, Pt with lumbar belt around hips and nek doodle for neck support and ankle buoys  .  Pt assisted into supine floating position by lying head on shoulder of PT to get into floating position. . PT at torso and assisting with trunk left to right and vice versa to engage and stretch trunk muscles. PT then rotated trunk in order to engage abdomnal (internal and external obliques)  Emphasis on breathing techniques to draw in abdominals for support.  -PPT x 10 -knee flex x 10 -hip ext x 5 lle, 8 RLE  Seated Glute stretch R/L x 3, 30 sec hold  Pt requires buoyancy for support and to offload joints with strengthening exercises. Viscosity of the water is needed for resistance of strengthening; water current perturbations provides challenge to standing balance unsupported, requiring increased core activation.   08/23/21 Pt seen for aquatic therapy today.  Treatment took place in water 3.25-4.8 ft in depth at the [employer] pool. Temp of water was 92.  Pt entered/exited the pool via stairs step to pattern independently with bilat rail.  Pt entered water for aquatic therapy for first time and was introduced to principles and therapeutic effects of water as she ambulated and acclimated to pool. Pt ambulates forward across pool , then backwards to  engage posterior chain and then sidesteping x 3-4 widths each.   Bad Ragaz, Pt with lumbar belt around hips and nek doodle for neck support.  .  Pt assisted into supine floating position by lying head on shoulder of  PT to get into floating position. . PT at torso and assisting with trunk left to right and vice versa to engage and stretch trunk muscles. PT then rotated trunk in order to engage abdomnal (internal and external obliques)  Emphasis on breathing techniques to draw in abdominals for support.  -PPT x 10 -knees to chest therapist securing at pt feet x 5, 30 sec hold for LB stretch  -hip knee flex/ext in above position x 10  Seated Glute stretch R/L x 4 30 sec hold  Pt requires buoyancy for support and to offload joints with strengthening exercises. Viscosity of the water is needed for resistance of strengthening; water current perturbations provides challenge to standing balance unsupported, requiring increased core activation.   PATIENT EDUCATION:  Properties of water, benefits of aquatic therapy.  PLAN FOR NEXT SESSION: Toleration of last session; stretching and gentle strengthening  Stanton Kidney (Frankie) Elmira Olkowski MPT  08/28/2021, 9:20 PM

## 2021-08-29 ENCOUNTER — Ambulatory Visit (HOSPITAL_BASED_OUTPATIENT_CLINIC_OR_DEPARTMENT_OTHER): Payer: Medicare Other | Admitting: Physical Therapy

## 2021-08-29 ENCOUNTER — Other Ambulatory Visit: Payer: Self-pay

## 2021-08-29 DIAGNOSIS — R262 Difficulty in walking, not elsewhere classified: Secondary | ICD-10-CM

## 2021-08-29 DIAGNOSIS — R293 Abnormal posture: Secondary | ICD-10-CM

## 2021-08-29 DIAGNOSIS — M6281 Muscle weakness (generalized): Secondary | ICD-10-CM

## 2021-08-29 DIAGNOSIS — R2681 Unsteadiness on feet: Secondary | ICD-10-CM

## 2021-08-29 DIAGNOSIS — G8929 Other chronic pain: Secondary | ICD-10-CM

## 2021-08-29 DIAGNOSIS — R2689 Other abnormalities of gait and mobility: Secondary | ICD-10-CM

## 2021-08-29 DIAGNOSIS — M5451 Vertebrogenic low back pain: Secondary | ICD-10-CM | POA: Diagnosis not present

## 2021-08-29 NOTE — Therapy (Signed)
OUTPATIENT PHYSICAL THERAPY TREATMENT NOTE   Patient Name: [name] MRN: [id number] DOB:Mar 17, 1956, 65 y.o., female Today's Date: 08/29/2021  PCP: Wardell Honour, MD REFERRING PROVIDER: Wardell Honour, MD   PT End of Session - 08/29/21 1209     Visit Number 4    Number of Visits 12    Date for PT Re-Evaluation 09/28/21    Progress Note Due on Visit --   09/17/2021   PT Start Time 1208    PT Stop Time 1252    PT Time Calculation (min) 44 min    Equipment Utilized During Treatment Other (comment)   Ankle cuff and waist buoys, kick board, noodle/sqoodle, nekdoodle, weights and barbells, water walker   Activity Tolerance Patient tolerated treatment well    Behavior During Therapy [facility] for tasks assessed/performed           3/12 visits Re-eval: 09/28/21 Start:905a Stop:950a Time:45 min Pt tolerated treatment well; [facility] for tasks assessed/performed   Past Medical History:  Diagnosis Date   Asthma    Back pain    Chronic fatigue syndrome    Dr. Sabra Heck   Complication of anesthesia    Cough    /SOB, PFT's nl, improved with Bronchodilation 8/11   Dysrhythmia    Endometrial cancer ([place])    Esophageal spasm    GERD Dr. Sabra Heck, Dr. Wynetta Emery; egd 2006 nl; UGI 3/13 Small HH, moderate GERD, nl motility; seen at [facility] ([place]), egd? neg, sone improvement on sucralfate susp   Fibromyalgia    Foot pain    Dr. Rushie Nyhan   Hashimoto's thyroiditis    High triglycerides    Hypothyroidism    Dr. Lewis Shock 4/14   Leukocytoclastic vasculitis ([place])    Dr. Tonia Brooms   Low HDL (under 40)    Memory loss    Mitral valve prolapse    Paroxysmal atrial tachycardia ([place])    Dr. Marlou Porch   Pernicious anemia    Dr. Sabra Heck   PONV (postoperative nausea and vomiting)    along time ago had nausea ans vomiting after surgery-25 years ago   Reflux    ? delayed gastric emptying--GES nl 01/2012 (6% at 2hrs)   RLS (restless legs syndrome)    low ferritin Dr. Sabra Heck   Rosacea    Sleep apnea     Uterine carcinoma ([place])    Dr. Polly Cobia   Past Surgical History:  Procedure Laterality Date   ABDOMINAL HYSTERECTOMY     bladder tack     x 2   BUNIONECTOMY     CHOLECYSTECTOMY  2009   COLONOSCOPY     scr, 12/2008 ([facility]) nl   COLOSTOMY Left 06/05/2018   Procedure: COLOSTOMY;  Surgeon: Ralene Ok, MD;  Location: [place];  Service: General;  Laterality: Left;   COLOSTOMY REVERSAL N/A 10/16/2018   Procedure: HARTMANN'S COLOSTOMY REVERSAL, RIGID PROCTOSCOPY;  Surgeon: Ralene Ok, MD;  [facility];  Service: General;  Laterality: N/A;   HAMMER TOE SURGERY     HYSTERECTOMY ABDOMINAL WITH SALPINGO-OOPHORECTOMY  2008   INCISIONAL HERNIA REPAIR  04/09/2019   x2   [place] N/A 04/09/2019   Procedure: INCISIONAL HERNIA REPAIR WITH MESH;  Surgeon: Ralene Ok, MD;  Location: [place];  Service: General;  Laterality: N/A;   LAPAROSCOPY N/A 10/16/2018   Procedure: LAPAROSCOPY DIAGNOSTIC WITH LYSIS OF ADHESIONS;  Surgeon: Ralene Ok, MD;  [facility];  Service: General;  Laterality: N/A;   LUNG BIOPSY     L Lower lung  pulm nodules, largest 4.28mm, low risk, repeat CT in 1 yr now following with pulm at [facility]; 9/13 Stable tiny lung nodules, no f/u needed   LYMPH NODE DISSECTION  2008   LYSIS OF ADHESION N/A 04/09/2019   Procedure: OPEN LYSIS OF ADHESION;  Surgeon: Ralene Ok, MD;  Location: [place];  Service: General;  Laterality: N/A;   METATARSAL OSTEOTOMY     NECK SURGERY     x 2   PARTIAL COLECTOMY N/A 06/05/2018   Procedure: EXPLORATORY LAPAROTOMY AND PARTIAL COLECTOMY;  Surgeon: Ralene Ok, MD;  Location: [place];  Service: General;  Laterality: N/A;   TONSILLECTOMY     US ECHOCARDIOGRAPHY     with Nuclear test, Dr. Marlou Porch, Low risk   VESICOVAGINAL FISTULA CLOSURE W/ TAH     Patient Active Problem List   Diagnosis Date Noted   Back pain    Fibromyalgia    S/P hernia repair 04/09/2019   S/P colostomy takedown 10/16/2018   Sigmoid  volvulus ([place]) 06/07/2018   Bowel perforation ([place]) 06/07/2018   Anemia 06/07/2018   Hypokalemia 06/07/2018   Hypophosphatemia 06/07/2018   AKI (acute kidney injury) ([place])    Leukocytosis (leucocytosis) 06/04/2018   Fecal impaction ([place]) 06/03/2018   Enterocolitis 06/03/2018   Asthma 06/03/2018   Chronic pain syndrome 06/03/2018   Chronic fatigue syndrome 11/28/2017   Pernicious anemia 11/28/2017   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  Pain in Lumbar Spine.  Vertebrogenic LBP  THERAPY DIAG:  Chronic pain of right knee  Difficulty in walking, not elsewhere classified  Abnormal posture  Other abnormalities of gait and mobility  Unsteadiness on feet  Muscle weakness (generalized)   SUBJECTIVE: "almost cancelled today because I am hurting so bad, get no relief.  The aquatics relieves pain but only for approx 30 minutes"  PAIN:  Are you having pain? Yes VAS scale: 7.5/10 08/29/21 Pain location: L glute/hip/SI which travels to lateral hip and down lateral thigh to L foot Pain orientation: Left  PAIN TYPE: aching, dull, sharp, throbbing Pain description: constant  Aggravating factors: bending over, lifting, quick turning with gait Relieving factors: meds, heat   PERTINENT HISTORY:  Chronic fatigue syndrome with PEM (post exertional malaise), fibromyalgia, Cervical surgery x 2 (disc replacement and fusion), neuropathy in bilat feet, SVT, hernia repair in 2020      PRECAUTIONS: Other: chronic fatigue syndrome, fibromyalgia, cervical disc replacement and fusion     HOME EXERCISE PROGRAM: Glute stretches   ASSESSMENT:   CLINICAL IMPRESSION:  Pt almost canceling today due to pain. With palpation pt continues to be tender in lrft sciatic notch area over toward anterior hip. Deep pressure massage to piriformis pt in adducted and internally  rotated hip position to expose. Pt was uncomfortable throughout.  Upon completion pt reporting feeling better than hen she came.      GOALS:     SHORT TERM GOALS:   STG Name Target Date Goal status  1 Pt will tolerate aquatic therapy without adverse effects in order to improve pain, strength, mobility, and tolerance to activity.  Baseline:  09/07/2021 INITIAL  2 Pt will demo improved lumbar SB and Rotation AROM to be [facility] bilat for improved mobility and stiffness.  Baseline:  09/14/2021 INITIAL  3 Pt will be able to stand to cook without significant pain. Baseline: 09/14/2021   INITIAL  4 Pt's worst pain  will be less than 8/10 for improved tolerance to activity Baseline: 09/07/2021 INITIAL  5 Pt will be independent with land based HEP for improved pain, strength, ROM, and function.  Baseline: 09/07/2021 INITIAL                      LONG TERM GOALS:    LTG Name Target Date Goal status  1 Pt will be able to apply weight thru L LE with stairs without increased pain and perform stairs with a reciprocal gait with rails.  Baseline: 11/31/2022 INITIAL  2 Pt will be able to perform her normal household chores without significant pain or difficulty.   Baseline: 11/31/2022 INITIAL  3 Pt will demo proper body mechanics with squat to lift/hip hinge technique for improved functional lifting including picking up her dog. Baseline: 11/31/2022 INITIAL  4 Pt will be able to turn with gait without increased pain. Baseline: 11/31/2022 INITIAL  5 Pt will demo improved core strength by progressing with aquatic exercises and land based core exercises without adverse effects for improved performance of and tolerance with functional mobility skills and daily activities.    Baseline: 11/31/2022 INITIAL  6 Pt will report at least a 60-70% improvement overall in pain and sx's.   Baseline: 11/31/2022 INITIAL             PLAN: PT FREQUENCY:  2-3x/wk x 2-3 weeks and 2x/wk afterwards   PT DURATION: other:  4-6 weeks   PLANNED INTERVENTIONS: Therapeutic exercises, Therapeutic activity, Neuro Muscular re-education, Gait training, Patient/Family education, Joint mobilization, Stair training, Aquatic Therapy, Dry Needling, Electrical stimulation, Spinal mobilization, Cryotherapy, Moist heat, Taping, Ultrasound, and Manual therapy        TODAY'S TREATMENT:  08/29/21  Pt seen for aquatic therapy today.  Treatment took place in water 3.25-4.8 ft in depth at the [employer] pool. Temp of water was 92.  Pt entered/exited the pool via stairs step to pattern independently with bilat rail.  Bad Ragaz, Pt with lumbar belt around hips and nek doodle for neck support and ankle buoys  .  Pt assisted into supine floating position by lying head on shoulder of PT to get into floating position. . PT at torso and assisting with trunk left to right and vice versa to engage and stretch trunk muscles. PT then rotated trunk in order to engage abdomnal (internal and external obliques). -knees to chest  Deep pressure to trigger points piriforms/sciatic notch area  Pt requires buoyancy for support and to offload joints with strengthening exercises. Viscosity of the water is needed for resistance of strengthening; water current perturbations provides challenge to standing balance unsupported, requiring increased core activation.   08/26/21  Pt seen for aquatic therapy today.  Treatment took place in water 3.25-4.8 ft in depth at the [employer] pool. Temp of water was 92.  Pt entered/exited the pool via stairs step to pattern independently with bilat rail.    Seated Stretching:  Glute stretch R/L x 4, 30 sec hold  Hamstring, gastroc  and adductor stretch 3 x 20-25 sec hold  Bad Ragaz, Pt with lumbar belt around hips and nek doodle for neck support and ankle buoys  .  Pt assisted into supine floating position by lying head on shoulder of PT to get into floating position. . PT at torso and assisting  with trunk left to right and vice versa to engage and stretch trunk muscles. PT then rotated trunk in order to engage abdomnal (internal and  external obliques)  Emphasis on breathing techniques to draw in abdominals for support.  -PPT x 10 -knee flex x 10 -hip ext x 5 lle, 8 RLE  Seated Glute stretch R/L x 3, 30 sec hold  Pt requires buoyancy for support and to offload joints with strengthening exercises. Viscosity of the water is needed for resistance of strengthening; water current perturbations provides challenge to standing balance unsupported, requiring increased core activation.   08/23/21 Pt seen for aquatic therapy today.  Treatment took place in water 3.25-4.8 ft in depth at the [employer] pool. Temp of water was 92.  Pt entered/exited the pool via stairs step to pattern independently with bilat rail.  Pt entered water for aquatic therapy for first time and was introduced to principles and therapeutic effects of water as she ambulated and acclimated to pool. Pt ambulates forward across pool , then backwards to engage posterior chain and then sidesteping x 3-4 widths each.   Bad Ragaz, Pt with lumbar belt around hips and nek doodle for neck support.  .  Pt assisted into supine floating position by lying head on shoulder of PT to get into floating position. . PT at torso and assisting with trunk left to right and vice versa to engage and stretch trunk muscles. PT then rotated trunk in order to engage abdomnal (internal and external obliques)  Emphasis on breathing techniques to draw in abdominals for support.  -PPT x 10 -knees to chest therapist securing at pt feet x 5, 30 sec hold for LB stretch  -hip knee flex/ext in above position x 10  Seated Glute stretch R/L x 4 30 sec hold  Pt requires buoyancy for support and to offload joints with strengthening exercises. Viscosity of the water is needed for resistance of strengthening; water current perturbations provides challenge to  standing balance unsupported, requiring increased core activation.   PATIENT EDUCATION:  Properties of water, benefits of aquatic therapy.  PLAN FOR NEXT SESSION: Toleration of last session; stretching and gentle strengthening  Stanton Kidney ([place]) Damere Brandenburg MPT  08/29/2021, 12:53 PM

## 2021-08-31 ENCOUNTER — Other Ambulatory Visit: Payer: Self-pay

## 2021-08-31 ENCOUNTER — Ambulatory Visit (HOSPITAL_BASED_OUTPATIENT_CLINIC_OR_DEPARTMENT_OTHER): Payer: Medicare Other | Attending: Orthopedic Surgery | Admitting: Physical Therapy

## 2021-08-31 ENCOUNTER — Encounter (HOSPITAL_BASED_OUTPATIENT_CLINIC_OR_DEPARTMENT_OTHER): Payer: Self-pay | Admitting: Physical Therapy

## 2021-08-31 DIAGNOSIS — M25561 Pain in right knee: Secondary | ICD-10-CM | POA: Diagnosis not present

## 2021-08-31 DIAGNOSIS — M545 Low back pain, unspecified: Secondary | ICD-10-CM | POA: Diagnosis not present

## 2021-08-31 DIAGNOSIS — G8929 Other chronic pain: Secondary | ICD-10-CM | POA: Diagnosis not present

## 2021-08-31 DIAGNOSIS — R2681 Unsteadiness on feet: Secondary | ICD-10-CM | POA: Insufficient documentation

## 2021-08-31 DIAGNOSIS — R293 Abnormal posture: Secondary | ICD-10-CM | POA: Diagnosis not present

## 2021-08-31 DIAGNOSIS — M6281 Muscle weakness (generalized): Secondary | ICD-10-CM | POA: Diagnosis not present

## 2021-08-31 DIAGNOSIS — M79605 Pain in left leg: Secondary | ICD-10-CM | POA: Insufficient documentation

## 2021-08-31 DIAGNOSIS — R2689 Other abnormalities of gait and mobility: Secondary | ICD-10-CM | POA: Insufficient documentation

## 2021-08-31 DIAGNOSIS — R262 Difficulty in walking, not elsewhere classified: Secondary | ICD-10-CM | POA: Insufficient documentation

## 2021-08-31 NOTE — Therapy (Signed)
OUTPATIENT PHYSICAL THERAPY TREATMENT NOTE   Patient Name: [name] MRN: [id number] DOB:1956-09-25, 65 y.o., female Today's Date: 09/01/2021  PCP: Wardell Honour, MD REFERRING PROVIDER: Melina Schools, MD   PT End of Session - 08/31/21 1157     Visit Number 5    Number of Visits 12    Date for PT Re-Evaluation 09/28/21    Progress Note Due on Visit --   09/17/21   PT Start Time 1153    PT Stop Time 1233    PT Time Calculation (min) 40 min    Activity Tolerance Patient tolerated treatment well    Behavior During Therapy [facility] for tasks assessed/performed              Past Medical History:  Diagnosis Date   Asthma    Back pain    Chronic fatigue syndrome    Dr. Sabra Heck   Complication of anesthesia    Cough    /SOB, PFT's nl, improved with Bronchodilation 8/11   Dysrhythmia    Endometrial cancer ([place])    Esophageal spasm    GERD Dr. Sabra Heck, Dr. Wynetta Emery; egd 2006 nl; UGI 3/13 Small HH, moderate GERD, nl motility; seen at [facility] ([place]), egd? neg, sone improvement on sucralfate susp   Fibromyalgia    Foot pain    Dr. Rushie Nyhan   Hashimoto's thyroiditis    High triglycerides    Hypothyroidism    Dr. Lewis Shock 4/14   Leukocytoclastic vasculitis ([place])    Dr. Tonia Brooms   Low HDL (under 40)    Memory loss    Mitral valve prolapse    Paroxysmal atrial tachycardia ([place])    Dr. Marlou Porch   Pernicious anemia    Dr. Sabra Heck   PONV (postoperative nausea and vomiting)    along time ago had nausea ans vomiting after surgery-25 years ago   Reflux    ? delayed gastric emptying--GES nl 01/2012 (6% at 2hrs)   RLS (restless legs syndrome)    low ferritin Dr. Sabra Heck   Rosacea    Sleep apnea    Uterine carcinoma ([place])    Dr. Polly Cobia   Past Surgical History:  Procedure Laterality Date   ABDOMINAL HYSTERECTOMY     bladder tack     x 2   BUNIONECTOMY     CHOLECYSTECTOMY  2009   COLONOSCOPY     scr, 12/2008 ([facility]) nl   COLOSTOMY Left 06/05/2018   Procedure: COLOSTOMY;   Surgeon: Ralene Ok, MD;  Location: [place];  Service: General;  Laterality: Left;   COLOSTOMY REVERSAL N/A 10/16/2018   Procedure: HARTMANN'S COLOSTOMY REVERSAL, RIGID PROCTOSCOPY;  Surgeon: Ralene Ok, MD;  [facility];  Service: General;  Laterality: N/A;   HAMMER TOE SURGERY     HYSTERECTOMY ABDOMINAL WITH SALPINGO-OOPHORECTOMY  2008   [place]  04/09/2019   x2   [place] N/A 04/09/2019   Procedure: INCISIONAL HERNIA REPAIR WITH MESH;  Surgeon: Ralene Ok, MD;  Location: [place];  Service: General;  Laterality: N/A;   LAPAROSCOPY N/A 10/16/2018   Procedure: LAPAROSCOPY DIAGNOSTIC WITH LYSIS OF ADHESIONS;  Surgeon: Ralene Ok, MD;  [facility];  Service: General;  Laterality: N/A;   LUNG BIOPSY     L Lower lung pulm nodules, largest 4.40mm, low risk, repeat CT in 1 yr now following with pulm at [facility]; 9/13 Stable tiny lung nodules, no f/u needed   LYMPH NODE DISSECTION  2008   LYSIS OF ADHESION N/A 04/09/2019  Procedure: OPEN LYSIS OF ADHESION;  Surgeon: Ralene Ok, MD;  Location: [place];  Service: General;  Laterality: N/A;   METATARSAL OSTEOTOMY     NECK SURGERY     x 2   PARTIAL COLECTOMY N/A 06/05/2018   Procedure: EXPLORATORY LAPAROTOMY AND PARTIAL COLECTOMY;  Surgeon: Ralene Ok, MD;  Location: [place];  Service: General;  Laterality: N/A;   TONSILLECTOMY     US ECHOCARDIOGRAPHY     with Nuclear test, Dr. Marlou Porch, Low risk   VESICOVAGINAL FISTULA CLOSURE W/ TAH     Patient Active Problem List   Diagnosis Date Noted   Back pain    Fibromyalgia    S/P hernia repair 04/09/2019   S/P colostomy takedown 10/16/2018   Sigmoid volvulus ([place]) 06/07/2018   Bowel perforation ([place]) 06/07/2018   Anemia 06/07/2018   Hypokalemia 06/07/2018   Hypophosphatemia 06/07/2018   AKI (acute kidney injury) ([place])    Leukocytosis (leucocytosis) 06/04/2018   Fecal impaction ([place]) 06/03/2018   Enterocolitis 06/03/2018    Asthma 06/03/2018   Chronic pain syndrome 06/03/2018   Chronic fatigue syndrome 11/28/2017   Pernicious anemia 11/28/2017   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  Pain in Lumbar Spine.  Vertebrogenic LBP  THERAPY DIAG:  Chronic low back pain, unspecified back pain laterality, unspecified whether sciatica present  Pain in left leg  Muscle weakness (generalized)  Difficulty in walking, not elsewhere classified   SUBJECTIVE: Pt states her pain has been pretty intractable.  Pt reports she has received dry needling in the past for her cervical and she felt great after DN.  Pt states she felt better after prior Aquatic Rx.  She states it hurt while she was receiving the aquatic Rx though she felt much better after Rx.  Pt reports she had been receiving pain relief for 30 minutes after aquatic Rx's though had a much better response to this prior Rx on Monday.  Pt was able to apply wt thru L LE with significantly reduced pain.  She was able to apply wt thru L LE with steps without significant pain.  She was able to apply wt thru L LE 1st thing this AM without significant pain.  Pt is walking today without cane; she had to use a cane all weekend due to pain.   Pt is performing seated glute/hip stretches at home.  PAIN:  Are you having pain? 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   Pt performed:   -TrA contraction with 5 sec hold.  Educated pt in correct performance and palpation.   -supine marching with TrA contraction 2x10 reps   -supine PPT 2x10 reps   -supine lumbar rotation 2X10 reps   -supine L piriformis stretch 3x20 seconds   Manual Therapy:  Pt received  STM to L glute piriformis in R S/L'ing with pillow b/w knees to improve tightness and pain.    PATIENT EDUCATION:  Properties of water, benefits of aquatic therapy.  Educated pt in performance and palpation of TrA contraction.  Educated pt in having knees bent with lying in supine. Educated pt with verbal, visual, and tactile cues.  Gave pt a HEP handout and educated pt in correct form and appropriate frequency.       HOME EXERCISE PROGRAM: Access Code: [email or web address] URL: [email or web address] Date: 08/31/2021 Prepared by: Ronny Flurry  Exercises Supine Transversus Abdominis Bracing - Hands on Stomach - 2-3 x daily - 7 x weekly - 1 sets - 10 reps - 5 seconds hold Supine March - 1-2 x daily - 7 x weekly - 2 sets - 10 reps Supine Posterior Pelvic Tilt - 2 x daily - 7 x weekly - 2 sets - 10 reps Hooklying Lumbar Rotation - 2 x daily - 7 x weekly - 2 sets - 10 reps    ASSESSMENT:   CLINICAL IMPRESSION:  Pt states she typically has very short term relief from aquatic therapy though received much better relief after prior aquatic Rx.  Pt states she was able to apply more weight thru L LE.  Pt has tightness in glutes and is tender with STM though reports improved sx's after STM.   Initiated HEP today and gave pt a HEP handout.  Pt performed exercises well with cuing and instruction in correct form and demonstrates good understanding of HEP.  Pt responded well to Rx stating she felt better after Rx than  before Rx.   Objective impairments include Abnormal gait, decreased activity tolerance, decreased endurance, decreased mobility, difficulty walking, decreased ROM, decreased strength, hypomobility, impaired flexibility, and pain. These impairments are limiting patient from cleaning, meal prep, laundry, and ambulation and standing activities.     GOALS:     SHORT TERM GOALS:   STG Name Target Date Goal status  1 Pt will tolerate aquatic therapy without adverse effects in order to improve pain, strength, mobility, and tolerance to activity.  Baseline:  09/07/2021 INITIAL  2 Pt will demo improved lumbar SB and Rotation AROM to be [facility] bilat for improved mobility and stiffness.  Baseline:  09/14/2021 INITIAL  3 Pt will be able to stand to cook without significant pain. Baseline: 09/14/2021   INITIAL  4 Pt's worst pain will be less than 8/10 for improved tolerance to activity Baseline: 09/07/2021 INITIAL  5 Pt will be independent with land based HEP for improved pain, strength, ROM, and function.  Baseline: 09/07/2021 INITIAL                      LONG TERM GOALS:    LTG Name Target Date Goal status  1 Pt will be able to apply weight thru L LE with stairs without increased pain and perform stairs with a reciprocal gait with rails.  Baseline: 11/31/2022 INITIAL  2 Pt will be able to perform her normal household chores without significant pain or difficulty.   Baseline: 11/31/2022 INITIAL  3 Pt will demo proper body mechanics with squat to lift/hip hinge technique for improved functional lifting including picking up her dog. Baseline: 11/31/2022 INITIAL  4 Pt will be able to turn with gait without increased pain. Baseline: 11/31/2022 INITIAL  5 Pt will demo improved core strength by progressing with aquatic exercises and land based core exercises without adverse effects for improved performance of and tolerance  with functional mobility skills and daily activities.    Baseline: 11/31/2022  INITIAL  6 Pt will report at least a 60-70% improvement overall in pain and sx's.   Baseline: 11/31/2022 INITIAL             PLAN:   PLANNED INTERVENTIONS: Therapeutic exercises, Therapeutic activity, Neuro Muscular re-education, Gait training, Patient/Family education, Joint mobilization, Stair training, Aquatic Therapy, Dry Needling, Electrical stimulation, Spinal mobilization, Cryotherapy, Moist heat, Taping, Ultrasound, and Manual therapy     PLAN FOR NEXT SESSION:  Cont with land and aquatic therapy  Selinda Michaels III PT, DPT 09/01/21 10:45 AM

## 2021-09-06 ENCOUNTER — Ambulatory Visit (HOSPITAL_BASED_OUTPATIENT_CLINIC_OR_DEPARTMENT_OTHER): Payer: Medicare Other | Admitting: Physical Therapy

## 2021-09-06 ENCOUNTER — Encounter (HOSPITAL_BASED_OUTPATIENT_CLINIC_OR_DEPARTMENT_OTHER): Payer: Self-pay | Admitting: Physical Therapy

## 2021-09-06 ENCOUNTER — Other Ambulatory Visit: Payer: Self-pay

## 2021-09-06 DIAGNOSIS — M6281 Muscle weakness (generalized): Secondary | ICD-10-CM

## 2021-09-06 DIAGNOSIS — R293 Abnormal posture: Secondary | ICD-10-CM | POA: Diagnosis not present

## 2021-09-06 DIAGNOSIS — M545 Low back pain, unspecified: Secondary | ICD-10-CM | POA: Diagnosis not present

## 2021-09-06 DIAGNOSIS — M79605 Pain in left leg: Secondary | ICD-10-CM

## 2021-09-06 DIAGNOSIS — G8929 Other chronic pain: Secondary | ICD-10-CM

## 2021-09-06 DIAGNOSIS — R262 Difficulty in walking, not elsewhere classified: Secondary | ICD-10-CM | POA: Diagnosis not present

## 2021-09-06 NOTE — Therapy (Signed)
OUTPATIENT PHYSICAL THERAPY TREATMENT NOTE   Patient Name: [name] MRN: [id number] DOB:12-Jul-1956, 65 y.o., female Today's Date: 09/06/2021  PCP: Wardell Honour, MD REFERRING PROVIDER: Melina Schools, MD   PT End of Session - 09/06/21 1453     Visit Number 6    Number of Visits 12    Date for PT Re-Evaluation 09/28/21    Progress Note Due on Visit --   09/17/2021   PT Start Time 1433    PT Stop Time 1518    PT Time Calculation (min) 45 min    Equipment Utilized During Treatment Other (comment)   Ankle floats, kick board, squoodle, neckdoodle,   Activity Tolerance Patient tolerated treatment well    Behavior During Therapy WFL for tasks assessed/performed               Past Medical History:  Diagnosis Date   Asthma    Back pain    Chronic fatigue syndrome    Dr. Sabra Heck   Complication of anesthesia    Cough    /SOB, PFT's nl, improved with Bronchodilation 8/11   Dysrhythmia    Endometrial cancer ([place])    Esophageal spasm    GERD Dr. Sabra Heck, Dr. Wynetta Emery; egd 2006 nl; UGI 3/13 Small HH, moderate GERD, nl motility; seen at [facility] ([place]), egd? neg, sone improvement on sucralfate susp   Fibromyalgia    Foot pain    Dr. Rushie Nyhan   Hashimoto's thyroiditis    High triglycerides    Hypothyroidism    Dr. Lewis Shock 4/14   Leukocytoclastic vasculitis ([place])    Dr. Tonia Brooms   Low HDL (under 40)    Memory loss    Mitral valve prolapse    Paroxysmal atrial tachycardia ([place])    Dr. Marlou Porch   Pernicious anemia    Dr. Sabra Heck   PONV (postoperative nausea and vomiting)    along time ago had nausea ans vomiting after surgery-25 years ago   Reflux    ? delayed gastric emptying--GES nl 01/2012 (6% at 2hrs)   RLS (restless legs syndrome)    low ferritin Dr. Sabra Heck   Rosacea    Sleep apnea    Uterine carcinoma ([place])    Dr. Polly Cobia   Past Surgical History:  Procedure Laterality Date   ABDOMINAL HYSTERECTOMY     bladder tack     x 2   BUNIONECTOMY      CHOLECYSTECTOMY  2009   COLONOSCOPY     scr, 12/2008 ([facility]) nl   COLOSTOMY Left 06/05/2018   Procedure: COLOSTOMY;  Surgeon: Ralene Ok, MD;  Location: [place];  Service: General;  Laterality: Left;   COLOSTOMY REVERSAL N/A 10/16/2018   Procedure: HARTMANN'S COLOSTOMY REVERSAL, RIGID PROCTOSCOPY;  Surgeon: Ralene Ok, MD;  [facility];  Service: General;  Laterality: N/A;   HAMMER TOE SURGERY     HYSTERECTOMY ABDOMINAL WITH SALPINGO-OOPHORECTOMY  2008   INCISIONAL HERNIA REPAIR  04/09/2019   x2   [place] N/A 04/09/2019   Procedure: INCISIONAL HERNIA REPAIR WITH MESH;  Surgeon: Ralene Ok, MD;  Location: [place];  Service: General;  Laterality: N/A;   LAPAROSCOPY N/A 10/16/2018   Procedure: LAPAROSCOPY DIAGNOSTIC WITH LYSIS OF ADHESIONS;  Surgeon: Ralene Ok, MD;  [facility];  Service: General;  Laterality: N/A;   LUNG BIOPSY     L Lower lung pulm nodules, largest 4.98mm, low risk, repeat CT in 1 yr now following with pulm at [facility]; 9/13 Stable tiny lung nodules, no  f/u needed   LYMPH NODE DISSECTION  2008   LYSIS OF ADHESION N/A 04/09/2019   Procedure: OPEN LYSIS OF ADHESION;  Surgeon: Ralene Ok, MD;  Location: [place];  Service: General;  Laterality: N/A;   METATARSAL OSTEOTOMY     NECK SURGERY     x 2   PARTIAL COLECTOMY N/A 06/05/2018   Procedure: EXPLORATORY LAPAROTOMY AND PARTIAL COLECTOMY;  Surgeon: Ralene Ok, MD;  Location: [place];  Service: General;  Laterality: N/A;   TONSILLECTOMY     US ECHOCARDIOGRAPHY     with Nuclear test, Dr. Marlou Porch, Low risk   VESICOVAGINAL FISTULA CLOSURE W/ TAH     Patient Active Problem List   Diagnosis Date Noted   Back pain    Fibromyalgia    S/P hernia repair 04/09/2019   S/P colostomy takedown 10/16/2018   Sigmoid volvulus ([place]) 06/07/2018   Bowel perforation ([place]) 06/07/2018   Anemia 06/07/2018   Hypokalemia 06/07/2018   Hypophosphatemia 06/07/2018   AKI (acute kidney injury)  ([place])    Leukocytosis (leucocytosis) 06/04/2018   Fecal impaction ([place]) 06/03/2018   Enterocolitis 06/03/2018   Asthma 06/03/2018   Chronic pain syndrome 06/03/2018   Chronic fatigue syndrome 11/28/2017   Pernicious anemia 11/28/2017   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  Pain in Lumbar Spine.  Vertebrogenic LBP  THERAPY DIAG:  Chronic low back pain, unspecified back pain laterality, unspecified whether sciatica present  Pain in left leg  Muscle weakness (generalized)  Difficulty in walking, not elsewhere classified   SUBJECTIVE: Pt states she had increased pain after prior land based Rx.  She states she had difficulty walking the following day due to pain.  She felt hunched the day after and was able to stand taller as the day progresses and with increased ambulation.  Though this typically happens on a daily basis.  Pt reports she lost her HEP and just found it today.  She has been performing her HEP from memory and forgot the supine marching exercise.   Pt is performing seated glute/hip stretches at home.  PAIN:  Are you having pain? 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  Treatment took place in water 3.5-4.8 ft in depth at the [employer] [facility] pool. Temp of water was 91.  Pt entered/exited the pool via stairs step to pattern independently with bilat rail.   -Reviewed response to prior Rx, current function, HEP compliance, and pain level.   -Reviewed HEP -Pt performed:   -Ambulating fwd, sidesepped x 3 laps, and ambulated bwd x 3 laps   -Marching with bilat UE assist on pool wall   -kickboard push/pull with TrA contraction 2x10 reps   -heel raises with TrA contraction with bilat UE assist on pool wall 2x10 reps   -seated glute stretch 3x30 sec bilat   -HS stretch on  step 2x20-30 sec on R and 1x20-30 sec on L   -bicycles 2x20 reps   -Pt received supine suspension with neck doodle, 2 squoodles, and ankle floats to promote relaxation and improve pain, tightness, and muscle tension.  PT moved pt through water.  PT also performed perturbations to improve core strength and stabilization.   Pt requires buoyancy for support and to offload joints with strengthening exercises. Viscosity of the water is needed for resistance of strengthening; water current perturbations provides challenge to standing balance unsupported, requiring increased core activation.      PATIENT EDUCATION:  -Reviewed HEP.  Educated pt concerning the properties of water and benefits of aquatic therapy.  Instructed pt to cont with HEP.        HOME EXERCISE PROGRAM: Access Code: [email or web address] URL: [email or web address] Date: 08/31/2021 Prepared by: Ronny Flurry  Exercises Supine Transversus Abdominis Bracing - Hands on Stomach - 2-3 x daily - 7 x weekly - 1 sets - 10 reps - 5 seconds hold Supine March - 1-2 x daily - 7 x weekly - 2 sets - 10 reps Supine Posterior Pelvic Tilt - 2 x daily - 7 x weekly - 2 sets - 10 reps Hooklying Lumbar Rotation - 2 x daily - 7 x weekly - 2 sets - 10 reps    ASSESSMENT:   CLINICAL IMPRESSION:  Pt presents to Rx reporting increased pain after prior land based Rx.  Pt performed aquatic exercises well  with cuing and instruction in correct form and positioning.  Pt gives good effort with all exercises.  Pt reports improved sx's with supine suspension.  Pt responded well to Rx stating she felt better after Rx and reports improved pain from 5/10 before Rx to 2/10 after Rx.  Pt should benefit from continued skilled PT to improve pain, tolerance to activity, function, and to address goals.    Objective impairments include Abnormal gait, decreased activity tolerance, decreased endurance, decreased mobility, difficulty walking, decreased ROM, decreased strength, hypomobility, impaired flexibility, and pain. These impairments are limiting patient from cleaning, meal prep, laundry, and ambulation and standing activities.     GOALS:     SHORT TERM GOALS:   STG Name Target Date Goal status  1 Pt will tolerate aquatic therapy without adverse effects in order to improve pain, strength, mobility, and tolerance to activity.  Baseline:  09/07/2021 INITIAL  2 Pt will demo improved lumbar SB and Rotation AROM to be [facility] bilat for improved mobility and stiffness.  Baseline:  09/14/2021 INITIAL  3 Pt will be able to stand to cook without significant pain. Baseline: 09/14/2021   INITIAL  4 Pt's worst pain will be less than 8/10 for improved tolerance to activity Baseline: 09/07/2021 INITIAL  5 Pt will be independent with land based HEP for improved pain, strength, ROM, and function.  Baseline: 09/07/2021 INITIAL                      LONG TERM GOALS:    LTG Name Target Date Goal status  1 Pt will be able to apply weight thru L LE with stairs without increased pain and perform stairs with a reciprocal gait with rails.  Baseline: 11/31/2022 INITIAL  2 Pt will be able to perform her normal household chores without significant pain or difficulty.   Baseline: 11/31/2022 INITIAL  3 Pt will demo proper body mechanics with squat to lift/hip hinge technique for improved functional lifting including picking up her  dog. Baseline: 11/31/2022 INITIAL  4 Pt will be able to turn with gait without increased pain. Baseline: 11/31/2022 INITIAL  5 Pt will demo improved core strength by progressing with aquatic exercises and land based core exercises without adverse effects for improved performance of and tolerance with functional mobility skills and daily activities.    Baseline: 11/31/2022 INITIAL  6 Pt will report at least a 60-70% improvement overall in pain and sx's.   Baseline: 11/31/2022 INITIAL             PLAN:   PLANNED INTERVENTIONS: Therapeutic exercises, Therapeutic activity, Neuro Muscular re-education, Gait training, Patient/Family education, Joint mobilization, Stair training, Aquatic Therapy, Dry Needling, Electrical stimulation, Spinal mobilization, Cryotherapy, Moist heat, Taping, Ultrasound, and Manual therapy     PLAN FOR NEXT SESSION:  Cont with land and aquatic therapy.  Aquatic therapy next visit.  Selinda Michaels III PT, DPT 09/06/21 10:52 PM

## 2021-09-07 DIAGNOSIS — Z1272 Encounter for screening for malignant neoplasm of vagina: Secondary | ICD-10-CM | POA: Diagnosis not present

## 2021-09-07 DIAGNOSIS — Z01411 Encounter for gynecological examination (general) (routine) with abnormal findings: Secondary | ICD-10-CM | POA: Diagnosis not present

## 2021-09-07 DIAGNOSIS — G9332 Myalgic encephalomyelitis/chronic fatigue syndrome: Secondary | ICD-10-CM | POA: Diagnosis not present

## 2021-09-07 DIAGNOSIS — Z6831 Body mass index (BMI) 31.0-31.9, adult: Secondary | ICD-10-CM | POA: Diagnosis not present

## 2021-09-07 DIAGNOSIS — L501 Idiopathic urticaria: Secondary | ICD-10-CM | POA: Diagnosis not present

## 2021-09-09 ENCOUNTER — Ambulatory Visit (HOSPITAL_BASED_OUTPATIENT_CLINIC_OR_DEPARTMENT_OTHER): Payer: Medicare Other | Admitting: Physical Therapy

## 2021-09-12 ENCOUNTER — Ambulatory Visit (HOSPITAL_BASED_OUTPATIENT_CLINIC_OR_DEPARTMENT_OTHER): Payer: Medicare Other | Admitting: Physical Therapy

## 2021-09-14 ENCOUNTER — Other Ambulatory Visit: Payer: Self-pay

## 2021-09-14 ENCOUNTER — Ambulatory Visit (HOSPITAL_BASED_OUTPATIENT_CLINIC_OR_DEPARTMENT_OTHER): Payer: Medicare Other | Admitting: Physical Therapy

## 2021-09-14 ENCOUNTER — Encounter (HOSPITAL_BASED_OUTPATIENT_CLINIC_OR_DEPARTMENT_OTHER): Payer: Self-pay | Admitting: Physical Therapy

## 2021-09-14 DIAGNOSIS — M6281 Muscle weakness (generalized): Secondary | ICD-10-CM

## 2021-09-14 DIAGNOSIS — M545 Low back pain, unspecified: Secondary | ICD-10-CM | POA: Diagnosis not present

## 2021-09-14 DIAGNOSIS — R293 Abnormal posture: Secondary | ICD-10-CM | POA: Diagnosis not present

## 2021-09-14 DIAGNOSIS — R262 Difficulty in walking, not elsewhere classified: Secondary | ICD-10-CM | POA: Diagnosis not present

## 2021-09-14 DIAGNOSIS — G8929 Other chronic pain: Secondary | ICD-10-CM

## 2021-09-14 DIAGNOSIS — M79605 Pain in left leg: Secondary | ICD-10-CM | POA: Diagnosis not present

## 2021-09-14 DIAGNOSIS — R2689 Other abnormalities of gait and mobility: Secondary | ICD-10-CM

## 2021-09-14 DIAGNOSIS — M25561 Pain in right knee: Secondary | ICD-10-CM

## 2021-09-14 DIAGNOSIS — R2681 Unsteadiness on feet: Secondary | ICD-10-CM

## 2021-09-14 NOTE — Therapy (Signed)
OUTPATIENT PHYSICAL THERAPY TREATMENT NOTE   Patient Name: [name] MRN: [id number] DOB:1956-09-10, 65 y.o., female Today's Date: 09/14/2021  PCP: Wardell Honour, MD REFERRING PROVIDER: Melina Schools, MD   PT End of Session - 09/14/21 1131     Visit Number 7    Number of Visits 12    Date for PT Re-Evaluation 09/28/21    Progress Note Due on Visit --   09/17/2021   PT Start Time 1120    PT Stop Time 1205    PT Time Calculation (min) 45 min    Equipment Utilized During Treatment Other (comment)   Ankle floats, kick board, squoodle, neckdoodle,   Activity Tolerance Patient tolerated treatment well    Behavior During Therapy WFL for tasks assessed/performed               Past Medical History:  Diagnosis Date   Asthma    Back pain    Chronic fatigue syndrome    Dr. Sabra Heck   Complication of anesthesia    Cough    /SOB, PFT's nl, improved with Bronchodilation 8/11   Dysrhythmia    Endometrial cancer ([place])    Esophageal spasm    GERD Dr. Sabra Heck, Dr. Wynetta Emery; egd 2006 nl; UGI 3/13 Small HH, moderate GERD, nl motility; seen at [facility] ([place]), egd? neg, sone improvement on sucralfate susp   Fibromyalgia    Foot pain    Dr. Rushie Nyhan   Hashimoto's thyroiditis    High triglycerides    Hypothyroidism    Dr. Lewis Shock 4/14   Leukocytoclastic vasculitis ([place])    Dr. Tonia Brooms   Low HDL (under 40)    Memory loss    Mitral valve prolapse    Paroxysmal atrial tachycardia ([place])    Dr. Marlou Porch   Pernicious anemia    Dr. Sabra Heck   PONV (postoperative nausea and vomiting)    along time ago had nausea ans vomiting after surgery-25 years ago   Reflux    ? delayed gastric emptying--GES nl 01/2012 (6% at 2hrs)   RLS (restless legs syndrome)    low ferritin Dr. Sabra Heck   Rosacea    Sleep apnea    Uterine carcinoma ([place])    Dr. Polly Cobia   Past Surgical History:  Procedure Laterality Date   ABDOMINAL HYSTERECTOMY     bladder tack     x 2   BUNIONECTOMY      CHOLECYSTECTOMY  2009   COLONOSCOPY     scr, 12/2008 ([facility]) nl   COLOSTOMY Left 06/05/2018   Procedure: COLOSTOMY;  Surgeon: Ralene Ok, MD;  Location: [place];  Service: General;  Laterality: Left;   COLOSTOMY REVERSAL N/A 10/16/2018   Procedure: HARTMANN'S COLOSTOMY REVERSAL, RIGID PROCTOSCOPY;  Surgeon: Ralene Ok, MD;  [facility];  Service: General;  Laterality: N/A;   HAMMER TOE SURGERY     HYSTERECTOMY ABDOMINAL WITH SALPINGO-OOPHORECTOMY  2008   [place]  04/09/2019   x2   [place] N/A 04/09/2019   Procedure: INCISIONAL HERNIA REPAIR WITH MESH;  Surgeon: Ralene Ok, MD;  Location: [place];  Service: General;  Laterality: N/A;   LAPAROSCOPY N/A 10/16/2018   Procedure: LAPAROSCOPY DIAGNOSTIC WITH LYSIS OF ADHESIONS;  Surgeon: Ralene Ok, MD;  [facility];  Service: General;  Laterality: N/A;   LUNG BIOPSY     L Lower lung pulm nodules, largest 4.48mm, low risk, repeat CT in 1 yr now following with pulm at [facility]; 9/13 Stable tiny lung nodules, no  f/u needed   LYMPH NODE DISSECTION  2008   LYSIS OF ADHESION N/A 04/09/2019   Procedure: OPEN LYSIS OF ADHESION;  Surgeon: Ralene Ok, MD;  Location: [place];  Service: General;  Laterality: N/A;   METATARSAL OSTEOTOMY     NECK SURGERY     x 2   PARTIAL COLECTOMY N/A 06/05/2018   Procedure: EXPLORATORY LAPAROTOMY AND PARTIAL COLECTOMY;  Surgeon: Ralene Ok, MD;  Location: [place];  Service: General;  Laterality: N/A;   TONSILLECTOMY     US ECHOCARDIOGRAPHY     with Nuclear test, Dr. Marlou Porch, Low risk   VESICOVAGINAL FISTULA CLOSURE W/ TAH     Patient Active Problem List   Diagnosis Date Noted   Back pain    Fibromyalgia    S/P hernia repair 04/09/2019   S/P colostomy takedown 10/16/2018   Sigmoid volvulus ([place]) 06/07/2018   Bowel perforation ([place]) 06/07/2018   Anemia 06/07/2018   Hypokalemia 06/07/2018   Hypophosphatemia 06/07/2018   AKI (acute kidney injury)  ([place])    Leukocytosis (leucocytosis) 06/04/2018   Fecal impaction ([place]) 06/03/2018   Enterocolitis 06/03/2018   Asthma 06/03/2018   Chronic pain syndrome 06/03/2018   Chronic fatigue syndrome 11/28/2017   Pernicious anemia 11/28/2017   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  Pain in Lumbar Spine.  Vertebrogenic LBP  THERAPY DIAG:  Abnormal posture  Other abnormalities of gait and mobility  Unsteadiness on feet  Chronic pain of right knee  Difficulty in walking, not elsewhere classified  Muscle weakness (generalized)   SUBJECTIVE: "I am just hurting bad, not getting any relief" Pt continues to complain of pain.  No relief for the most part other than for a short hour or two after session.  Has called MD with desire to have MRI.  PAIN:  Are you having pain? 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  Treatment took place in water 3.5-4.8 ft in depth at the [employer] pool. Temp of water was 91.  Pt entered/exited the pool via stairs step to pattern independently with bilat rail.   -Reviewed response to prior Rx, current function, HEP compliance, and pain level.  -Bad Ragaz Pt with lumbar belt around hips, noodle, squoodle and nek  doodle for neck support.  .  Pt assisted into supine floating position . PT at torso and assisting with trunk left to right and vice versa to engage trunk muscles stretching and strengthening. PT then rotated trunk in order to engage abdomnal (internal and external obliques). -PPT x10 -knee flex with PPT x 10 -hip ext with PPT x 10 Posterior core stretching in above position: assisted knee to chest the right and left rotation -manual QL stretch R/L     Pt requires buoyancy for support and to offload joints with strengthening exercises. Viscosity of the water is needed  for resistance of strengthening; water current perturbations provides challenge to standing balance unsupported, requiring increased core activation.      PATIENT EDUCATION:  -Reviewed HEP.  Educated pt concerning the properties of water and benefits of aquatic therapy.  Instructed pt to cont with HEP.        HOME EXERCISE PROGRAM: Access Code: [email or web address] URL: [email or web address] Date: 08/31/2021 Prepared by: Ronny Flurry  Exercises Supine Transversus Abdominis Bracing - Hands on Stomach - 2-3 x daily - 7 x weekly - 1 sets - 10 reps - 5 seconds hold Supine March - 1-2 x daily - 7 x weekly - 2 sets - 10 reps Supine Posterior Pelvic Tilt - 2 x daily - 7 x weekly - 2 sets - 10 reps Hooklying Lumbar Rotation - 2 x daily - 7 x weekly - 2 sets - 10 reps    ASSESSMENT:   CLINICAL IMPRESSION:  Pt with concerns of little progress.  She had flu last week missing a visit or two.  She completes prescribed exercises well today with decrease in pain from 8/10 LB to 1/10 while submerged.  Pt core stretched well with bad Ragaz and manual techniques. Good core stabilization holding PPT with knee flex and hip extension in suspended supine position. Palpable improvement in glute strength. Some discomfort in hip to mid thigh with hip extension.    Objective impairments include Abnormal gait, decreased activity tolerance,  decreased endurance, decreased mobility, difficulty walking, decreased ROM, decreased strength, hypomobility, impaired flexibility, and pain. These impairments are limiting patient from cleaning, meal prep, laundry, and ambulation and standing activities.     GOALS:     SHORT TERM GOALS:   STG Name Target Date Goal status  1 Pt will tolerate aquatic therapy without adverse effects in order to improve pain, strength, mobility, and tolerance to activity.  Baseline:  09/07/2021 INITIAL  2 Pt will demo improved lumbar SB and Rotation AROM to be [facility] bilat for improved mobility and stiffness.  Baseline:  09/14/2021 INITIAL  3 Pt will be able to stand to cook without significant pain. Baseline: 09/14/2021   INITIAL  4 Pt's worst pain will be less than 8/10 for improved tolerance to activity Baseline: 09/07/2021 INITIAL  5 Pt will be independent with land based HEP for improved pain, strength, ROM, and function.  Baseline: 09/07/2021 INITIAL                      LONG TERM GOALS:    LTG Name Target Date Goal status  1 Pt will be able to apply weight thru L LE with stairs without increased pain and perform stairs with a reciprocal gait with rails.  Baseline: 11/31/2022 INITIAL  2 Pt will be able to perform her normal household chores without significant pain or difficulty.   Baseline: 11/31/2022 INITIAL  3 Pt will demo proper body mechanics with squat to lift/hip hinge technique for improved functional lifting including picking up her dog. Baseline: 11/31/2022 INITIAL  4 Pt will be able to turn with gait without increased pain. Baseline: 11/31/2022 INITIAL  5 Pt will demo improved core strength by progressing with aquatic exercises and land based core exercises without adverse effects for improved performance of and tolerance with functional mobility skills and daily activities.    Baseline: 11/31/2022 INITIAL  6 Pt will report at least a 60-70% improvement overall in pain and sx's.    Baseline: 11/31/2022 INITIAL  PLAN:   PLANNED INTERVENTIONS: Therapeutic exercises, Therapeutic activity, Neuro Muscular re-education, Gait training, Patient/Family education, Joint mobilization, Stair training, Aquatic Therapy, Dry Needling, Electrical stimulation, Spinal mobilization, Cryotherapy, Moist heat, Taping, Ultrasound, and Manual therapy     PLAN FOR NEXT SESSION:  follow up with Dr Rolena Infante?? Plan?  [facility]anton Kidney Tharon Aquas) Toshika Parrow MPT 09/14/21 12:17 PM

## 2021-09-19 ENCOUNTER — Other Ambulatory Visit: Payer: Self-pay

## 2021-09-19 ENCOUNTER — Encounter (HOSPITAL_BASED_OUTPATIENT_CLINIC_OR_DEPARTMENT_OTHER): Payer: Self-pay | Admitting: Physical Therapy

## 2021-09-19 ENCOUNTER — Ambulatory Visit (HOSPITAL_BASED_OUTPATIENT_CLINIC_OR_DEPARTMENT_OTHER): Payer: Medicare Other | Admitting: Physical Therapy

## 2021-09-19 DIAGNOSIS — M79605 Pain in left leg: Secondary | ICD-10-CM

## 2021-09-19 DIAGNOSIS — G8929 Other chronic pain: Secondary | ICD-10-CM | POA: Diagnosis not present

## 2021-09-19 DIAGNOSIS — R262 Difficulty in walking, not elsewhere classified: Secondary | ICD-10-CM | POA: Diagnosis not present

## 2021-09-19 DIAGNOSIS — M6281 Muscle weakness (generalized): Secondary | ICD-10-CM | POA: Diagnosis not present

## 2021-09-19 DIAGNOSIS — R293 Abnormal posture: Secondary | ICD-10-CM | POA: Diagnosis not present

## 2021-09-19 DIAGNOSIS — M545 Low back pain, unspecified: Secondary | ICD-10-CM | POA: Diagnosis not present

## 2021-09-19 NOTE — Therapy (Addendum)
OUTPATIENT PHYSICAL THERAPY/PROGRESS NOTE  Progress Note Reporting Period 08/17/2021 to 09/19/2021  See note below for Objective Data and Assessment of Progress/Goals.       Patient Name: [name] MRN: [id number] DOB:1956-08-09, 65 y.o., female Today's Date: 09/19/2021   PT End of Session - 09/19/21 1114     Visit Number 8    Number of Visits 12    Date for PT Re-Evaluation 10/17/21    Progress Note Due on Visit --   10/17/21   PT Start Time 1108    PT Stop Time 1151    PT Time Calculation (min) 43 min    Activity Tolerance Patient tolerated treatment well    Behavior During Therapy [facility] for tasks assessed/performed             Past Medical History:  Diagnosis Date   Asthma    Back pain    Chronic fatigue syndrome    Dr. Sabra Heck   Complication of anesthesia    Cough    /SOB, PFT's nl, improved with Bronchodilation 8/11   Dysrhythmia    Endometrial cancer ([place])    Esophageal spasm    GERD Dr. Sabra Heck, Dr. Wynetta Emery; egd 2006 nl; UGI 3/13 Small HH, moderate GERD, nl motility; seen at [facility] ([place]), egd? neg, sone improvement on sucralfate susp   Fibromyalgia    Foot pain    Dr. Rushie Nyhan   Hashimoto's thyroiditis    High triglycerides    Hypothyroidism    Dr. Lewis Shock 4/14   Leukocytoclastic vasculitis ([place])    Dr. Tonia Brooms   Low HDL (under 40)    Memory loss    Mitral valve prolapse    Paroxysmal atrial tachycardia ([place])    Dr. Marlou Porch   Pernicious anemia    Dr. Sabra Heck   PONV (postoperative nausea and vomiting)    along time ago had nausea ans vomiting after surgery-25 years ago   Reflux    ? delayed gastric emptying--GES nl 01/2012 (6% at 2hrs)   RLS (restless legs syndrome)    low ferritin Dr. Sabra Heck   Rosacea    Sleep apnea    Uterine carcinoma ([place])    Dr. Polly Cobia   Past Surgical History:  Procedure Laterality Date   ABDOMINAL HYSTERECTOMY     bladder tack     x 2   BUNIONECTOMY     CHOLECYSTECTOMY  2009   COLONOSCOPY     scr, 12/2008  ([facility]) nl   COLOSTOMY Left 06/05/2018   Procedure: COLOSTOMY;  Surgeon: Ralene Ok, MD;  Location: [place];  Service: General;  Laterality: Left;   COLOSTOMY REVERSAL N/A 10/16/2018   Procedure: HARTMANN'S COLOSTOMY REVERSAL, RIGID PROCTOSCOPY;  Surgeon: Ralene Ok, MD;  [facility];  Service: General;  Laterality: N/A;   HAMMER TOE SURGERY     HYSTERECTOMY ABDOMINAL WITH SALPINGO-OOPHORECTOMY  2008   [place]  04/09/2019   x2   [place] N/A 04/09/2019   Procedure: INCISIONAL HERNIA REPAIR WITH MESH;  Surgeon: Ralene Ok, MD;  Location: [place];  Service: General;  Laterality: N/A;   LAPAROSCOPY N/A 10/16/2018   Procedure: LAPAROSCOPY DIAGNOSTIC WITH LYSIS OF ADHESIONS;  Surgeon: Ralene Ok, MD;  [facility];  Service: General;  Laterality: N/A;   LUNG BIOPSY     L Lower lung pulm nodules, largest 4.58m, low risk, repeat CT in 1 yr now [facility] 9/13 Stable tiny lung nodules, no f/u needed   LYMPH NODE DISSECTION  2008   LYSIS OF ADHESION N/A 04/09/2019   Procedure: OPEN LYSIS OF ADHESION;  Surgeon: Ralene Ok, MD;  Location: [place];  Service: General;  Laterality: N/A;   METATARSAL OSTEOTOMY     NECK SURGERY     x 2   PARTIAL COLECTOMY N/A 06/05/2018   Procedure: EXPLORATORY LAPAROTOMY AND PARTIAL COLECTOMY;  Surgeon: Ralene Ok, MD;  Location: [place];  Service: General;  Laterality: N/A;   TONSILLECTOMY     US ECHOCARDIOGRAPHY     with Nuclear test, Dr. Marlou Porch, Low risk   VESICOVAGINAL FISTULA CLOSURE W/ TAH     Patient Active Problem List   Diagnosis Date Noted   Back pain    Fibromyalgia    S/P hernia repair 04/09/2019   S/P colostomy takedown 10/16/2018   Sigmoid volvulus ([place]) 06/07/2018   Bowel perforation ([place]) 06/07/2018   Anemia 06/07/2018   Hypokalemia 06/07/2018   Hypophosphatemia 06/07/2018   AKI (acute kidney injury) ([place])    Leukocytosis (leucocytosis) 06/04/2018   Fecal  impaction ([place]) 06/03/2018   Enterocolitis 06/03/2018   Asthma 06/03/2018   Chronic pain syndrome 06/03/2018   Chronic fatigue syndrome 11/28/2017   Pernicious anemia 11/28/2017   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  Pain in Lumbar Spine.  Vertebrogenic LBP  THERAPY DIAG:  Chronic LBP M54.50 Pain in Left Leg  M79.605 Muscle weakness M62.81 Joint stiffness in lumbar Difficulty in walking R26.2  ONSET DATE: Chronic pain though exacerbation in early October  SUBJECTIVE:                                                                                                                                                                                           SUBJECTIVE STATEMENT: -Pt reports she has pain with bending over.  She doesn't pick up her dog due to pain.  Pt is limited with performing household chores due to pain.  Pt unable to bend over to load a dish in the dishwasher.  Pt has increased pain with standing.  Pt has difficulty sleeping due to pain.  Pt reports no change with ambulation or performing laundry.  Pt has pain and difficulty with performing stairs or descending a curb including applying weight thru L LE.   -Pt hasn't been compliant with HEP.  -Pt states she had a day last week when her pain was so bad she couldn't put weight thru the floor.  Pt called MD and spoke to MD's  office about having a MRI.  Pt states MD has ordered a MRI.   -FUNCTIONAL IMPROVEMENTS:  Pt reports 20% improvement overall in pain and sx's.  standing to wash dishes, personal care, standing to wash/dry hair.  Making her bed more often. reduced pain with turning with gait -Pt states she feels better in the water and has some short term improvement in the water though has pain in her leg by the time she gets to her  car.  Pt c/o's of a stabbing, electric pain in L post hip/buttock which travels down L LE to calf.  Pt can have pain to her foot.   Pt is able to get into a position on her sofa where she doesn't have any pain.      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 Yes Pain scale: 6-7/10, 10/10 worst pain, 0/10 best Pain location: L glute/hip/SI which travels to lateral hip and down lateral thigh to L foot Pain orientation: Left  PAIN TYPE: aching, dull, sharp, electric, stabbing, and throbbing Pain description: constant  Aggravating factors: bending over, lifting, quick turning with gait Relieving factors: meds, heat  PRECAUTIONS: Other: chronic fatigue syndrome, fibromyalgia, cervical disc replacement and fusion    PLOF: Independent.  Pt has chronic lumbar pain though reports she was able to perform her daily activities and functional mobility with much less pain and improved ease prior to this exacerbation.  Pt had covid in January and staets she never returned to her full capacity.  She reports returning to about 70% of her normal.      OBJECTIVE:    PATIENT SURVEYS:  Modified Oswestry 52%  1:3, 2:1, 3:2, 4:3, 5:3, 6:3, 7:2, 8:2, 9:4, 10:3.       LUMBARAROM/PROM  A/PROM AROM  09/19/2021  Flexion 75%  Extension WFL  Right lateral flexion 90%  Left lateral flexion 80% with pain  Right rotation 50%  Left rotation 50%   (Blank rows = not tested)     LE MMT:  MMT Right 09/19/2021 Left 09/19/2021  Hip flexion 5/5 Initially 5/5 with pain Currently 4+/5 with pain   Hip extension    Hip abduction    Hip adduction    Hip internal rotation    Hip external rotation 5/5 5/5  Knee flexion 5/5 tested in sitting 4+/5 tested in sitting Initially. 5/5 currently in sitting  Knee extension  5/5 5/5   Ankle dorsiflexion 4+/5 4+/5  Ankle plantarflexion Able to tolerate good resistance; not shaky today Able to tolerate good resistance; not shaky today  Ankle inversion    Ankle eversion     (Blank rows = not tested)   GAIT: Assistive device utilized: None Level of assistance: Complete Independence Comments: pt ambulates with a stiff gait pattern, decreased pelvic rotation bilat, and decreased reciprocal arm swing.  No increased pain with turning with gait     PATIENT EDUCATION:  Education details: Educated pt concerning POC.  Answered Pt's questions.  Objective findings and objective progress/deficits.  Instructed pt to be compliant with HEP.  Person educated: Patient  Education method: Explanation Education comprehension: verbalized understanding   HOME EXERCISE PROGRAM:  Access Code: [email or web address] URL: [email or web address] Date: 08/31/2021 Prepared by: Ronny Flurry   Exercises Supine Transversus Abdominis Bracing - Hands on Stomach - 2-3 x daily - 7 x weekly -  1 sets - 10 reps - 5 seconds hold Supine March - 1-2 x daily - 7 x weekly - 2 sets - 10 reps Supine Posterior Pelvic Tilt - 2 x daily - 7 x weekly - 2 sets - 10 reps Hooklying Lumbar Rotation - 2 x daily - 7 x weekly - 2 sets - 10 reps  ASSESSMENT:  CLINICAL IMPRESSION: Pt continues to have significant pain which limits her daily activities and functional mobility.  She is limited with standing activities and performing household chores.  Pt has increased pain with applying weight thru L LE which causes difficulty with performing stairs or curbs.  Pt is limited with lifting and unable to lift her dog.  Though pt continues to have significant pain, she reports having reduced pain with self care activities, making her bed, and standing to wash dishes.  Pt demonstrate improved lumbar AROM in flexion, L SB'ing, L rotation though continues to have deficits.  Pt demonstrated self perceived disability with  Oswestry improving 60% initially to 52% currently though not clinically significant.  Pt had no change in her gait deficits though had no pain with turning with gait. Pt has made limited progress toward goals.  See below for goal progress.  Patient may benefit from continued skilled PT to address ongoing goals and improve overall function.     Objective impairments include Abnormal gait, decreased activity tolerance, decreased endurance, decreased mobility, difficulty walking, decreased ROM, decreased strength, hypomobility, impaired flexibility, and pain. These impairments are limiting patient from cleaning, meal prep, laundry, and ambulation and standing activities . Personal factors including 3+ comorbidities: Chronic fatigue syndrome, fibromyalgia, chronic pain, and cervical disc replacement and fusion  are also affecting patient's functional outcome.      GOALS:   SHORT TERM GOALS:  STG Name Target Date Goal status  1 Pt will tolerate aquatic therapy without adverse effects in order to improve pain, strength, mobility, and tolerance to activity.  Baseline:  09/07/2021 GOAL MET  2 Pt will demo improved lumbar SB and Rotation AROM to be [facility] bilat for improved mobility and stiffness.  Baseline:  09/14/2021 ONGOING  3 Pt will be able to stand to wash dishes without significant pain. Baseline:  (Goal changed to washing dishes since pt's husband does the cooking) 09/14/2021  PROGRESSING  4 Pt's worst pain will be less than 8/10 for improved tolerance to activity Baseline: 09/07/2021 NOT MET  5 Pt will be independent with land based HEP for improved pain, strength, ROM, and function.  Baseline: 09/07/2021 NOT MET             LONG TERM GOALS:   LTG Name Target Date Goal status  1 Pt will be able to apply weight thru L LE with stairs without increased pain and perform stairs with a reciprocal gait with rails.  Baseline: 11/31/2022 NOT MET  2 Pt will be able to perform her normal household  chores without significant pain or difficulty.   Baseline: 11/31/2022 NOT MET  3 Pt will demo proper body mechanics with squat to lift/hip hinge technique for improved functional lifting including picking up her dog. Baseline: 11/31/2022 NOT ASSESSED  4 Pt will be able to turn with gait without increased pain. Baseline: 11/31/2022 GOAL MET  5 Pt will demo improved core strength by progressing with aquatic exercises and land based core exercises without adverse effects for improved performance of and tolerance with functional mobility skills and daily activities.    Baseline: 11/31/2022 ONGOING  6  Pt will report at least a 60-70% improvement overall in pain and sx's.   Baseline: 11/31/2022 PROGRESSING        PLAN: PT FREQUENCY:  2x per week   PT DURATION: other: 4 weeks  PLANNED INTERVENTIONS: Therapeutic exercises, Therapeutic activity, Neuro Muscular re-education, Gait training, Patient/Family education, Joint mobilization, Stair training, Aquatic Therapy, Dry Needling, Electrical stimulation, Spinal mobilization, Cryotherapy, Moist heat, Taping, Ultrasound, and Manual therapy  PLAN FOR NEXT SESSION: Cont with PT with increased focus on Aquatic therapy.  PT to review land HEP and progress land HEP next land Rx.  MT as pt tolerates.    Selinda Michaels III PT, DPT 09/19/21 7:58 PM

## 2021-09-21 ENCOUNTER — Encounter (HOSPITAL_BASED_OUTPATIENT_CLINIC_OR_DEPARTMENT_OTHER): Payer: Self-pay | Admitting: Physical Therapy

## 2021-09-21 ENCOUNTER — Other Ambulatory Visit: Payer: Self-pay

## 2021-09-21 ENCOUNTER — Ambulatory Visit (HOSPITAL_BASED_OUTPATIENT_CLINIC_OR_DEPARTMENT_OTHER): Payer: Medicare Other | Admitting: Physical Therapy

## 2021-09-21 DIAGNOSIS — R2681 Unsteadiness on feet: Secondary | ICD-10-CM

## 2021-09-21 DIAGNOSIS — M79605 Pain in left leg: Secondary | ICD-10-CM | POA: Diagnosis not present

## 2021-09-21 DIAGNOSIS — M25561 Pain in right knee: Secondary | ICD-10-CM

## 2021-09-21 DIAGNOSIS — R293 Abnormal posture: Secondary | ICD-10-CM

## 2021-09-21 DIAGNOSIS — R262 Difficulty in walking, not elsewhere classified: Secondary | ICD-10-CM

## 2021-09-21 DIAGNOSIS — M6281 Muscle weakness (generalized): Secondary | ICD-10-CM | POA: Diagnosis not present

## 2021-09-21 DIAGNOSIS — M545 Low back pain, unspecified: Secondary | ICD-10-CM | POA: Diagnosis not present

## 2021-09-21 DIAGNOSIS — G8929 Other chronic pain: Secondary | ICD-10-CM | POA: Diagnosis not present

## 2021-09-21 DIAGNOSIS — R2689 Other abnormalities of gait and mobility: Secondary | ICD-10-CM

## 2021-09-21 NOTE — Therapy (Addendum)
OUTPATIENT PHYSICAL THERAPY/PROGRESS NOTE  PHYSICAL THERAPY DISCHARGE SUMMARY  Visits from Start of Care: 08/17/21  Current functional level related to goals / functional outcomes: Improved toleration to standing and turning without increase in pain   Remaining deficits: Pain limiting all activity and decreasing activity toleration   Education / Equipment: Management of condition/HEP   Patient agrees to discharge. Patient goals were partially met. Patient is being discharged due to not returning since the last visit. Stanton Kidney Tharon Aquas) Blossom Crume MPT 01/23/22       Patient Name: [name] MRN: [id number] DOB:Nov 30, 1955, 65 y.o., female Today's Date: 09/21/2021   PT End of Session - 09/21/21 1547     Visit Number 9    Number of Visits 12    Date for PT Re-Evaluation 10/17/21    Progress Note Due on Visit --   10/17/21   PT Start Time 1501    PT Stop Time 1546    PT Time Calculation (min) 45 min    Activity Tolerance Patient tolerated treatment well    Behavior During Therapy [facility] for tasks assessed/performed             Past Medical History:  Diagnosis Date   Asthma    Back pain    Chronic fatigue syndrome    Dr. Sabra Heck   Complication of anesthesia    Cough    /SOB, PFT's nl, improved with Bronchodilation 8/11   Dysrhythmia    Endometrial cancer ([place])    Esophageal spasm    GERD Dr. Sabra Heck, Dr. Wynetta Emery; egd 2006 nl; UGI 3/13 Small HH, moderate GERD, nl motility; seen at [facility] ([place]), egd? neg, sone improvement on sucralfate susp   Fibromyalgia    Foot pain    Dr. Rushie Nyhan   Hashimoto's thyroiditis    High triglycerides    Hypothyroidism    Dr. Lewis Shock 4/14   Leukocytoclastic vasculitis ([place])    Dr. Tonia Brooms   Low HDL (under 40)    Memory loss    Mitral valve prolapse    Paroxysmal atrial tachycardia ([place])    Dr. Marlou Porch   Pernicious anemia    Dr. Sabra Heck   PONV (postoperative nausea and vomiting)    along time ago had nausea ans vomiting after  surgery-25 years ago   Reflux    ? delayed gastric emptying--GES nl 01/2012 (6% at 2hrs)   RLS (restless legs syndrome)    low ferritin Dr. Sabra Heck   Rosacea    Sleep apnea    Uterine carcinoma ([place])    Dr. Polly Cobia   Past Surgical History:  Procedure Laterality Date   ABDOMINAL HYSTERECTOMY     bladder tack     x 2   BUNIONECTOMY     CHOLECYSTECTOMY  2009   COLONOSCOPY     scr, 12/2008 ([facility]) nl   COLOSTOMY Left 06/05/2018   Procedure: COLOSTOMY;  Surgeon: Ralene Ok, MD;  Location: [place];  Service: General;  Laterality: Left;   COLOSTOMY REVERSAL N/A 10/16/2018   Procedure: HARTMANN'S COLOSTOMY REVERSAL, RIGID PROCTOSCOPY;  Surgeon: Ralene Ok, MD;  [facility];  Service: General;  Laterality: N/A;   HAMMER TOE SURGERY     HYSTERECTOMY ABDOMINAL WITH SALPINGO-OOPHORECTOMY  2008   [place]  04/09/2019   x2   [place] N/A 04/09/2019   Procedure: INCISIONAL HERNIA REPAIR WITH MESH;  Surgeon: Ralene Ok, MD;  Location: [place];  Service: General;  Laterality: N/A;   LAPAROSCOPY N/A 10/16/2018   Procedure: LAPAROSCOPY  DIAGNOSTIC WITH LYSIS OF ADHESIONS;  Surgeon: Ralene Ok, MD;  [facility];  Service: General;  Laterality: N/A;   LUNG BIOPSY     L Lower lung pulm nodules, largest 4.63m, low risk, repeat CT in 1 yr now [facility] Stable tiny lung nodules, no f/u needed   LYMPH NODE DISSECTION  2008   LYSIS OF ADHESION N/A 04/09/2019   Procedure: OPEN LYSIS OF ADHESION;  Surgeon: RRalene Ok MD;  [facility]  Service: General;  Laterality: N/A;   METATARSAL OSTEOTOMY     NECK SURGERY     x 2   PARTIAL COLECTOMY N/A 06/05/2018   Procedure: EXPLORATORY LAPAROTOMY AND PARTIAL COLECTOMY;  Surgeon: RRalene Ok MD;  [place]  Service: General;  Laterality: N/A;   TONSILLECTOMY     UKoreaECHOCARDIOGRAPHY     with Nuclear test, Dr. SMarlou Porch Low risk   VESICOVAGINAL FISTULA CLOSURE  W/ TAH     Patient Active Problem List   Diagnosis Date Noted   Back pain    Fibromyalgia    S/P hernia repair 04/09/2019   S/P colostomy takedown 10/16/2018   Sigmoid volvulus ([place] 06/07/2018   Bowel perforation ([place] 06/07/2018   Anemia 06/07/2018   Hypokalemia 06/07/2018   Hypophosphatemia 06/07/2018   AKI (acute kidney injury) ([place]    Leukocytosis (leucocytosis) 06/04/2018   Fecal impaction ([place] 06/03/2018   Enterocolitis 06/03/2018   Asthma 06/03/2018   Chronic pain syndrome 06/03/2018   Chronic fatigue syndrome 11/28/2017   Pernicious anemia 11/28/2017   Vasculitis of skin 02/28/2017   Orthostatic hypotension 03/22/2015   Paroxysmal atrial tachycardia ([place]    Hypothyroidism 08/09/2012    PCP: MWardell Honour MD REFERRING PROVIDER: BMelina Schools MD  REFERRING DIAG: M54.51.  Pain in Lumbar Spine.  Vertebrogenic LBP  THERAPY DIAG:  Chronic LBP M54.50 Pain in Left Leg  M79.605 Muscle weakness M62.81 Joint stiffness in lumbar Difficulty in walking R26.2  ONSET DATE: Chronic pain though exacerbation in early October  SUBJECTIVE:                                                                                                                                                                                           SUBJECTIVE STATEMENT: "MRI scheduled for 12/5.  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 Yes Pain  scale: 6-7/10, 10/10 worst pain, 0/10 best Pain location: L glute/hip/SI which travels to lateral hip and down lateral thigh to L foot Pain orientation: Left  PAIN TYPE: aching, dull, sharp, electric, stabbing, and throbbing Pain description: constant  Aggravating factors:  bending over, lifting, quick turning with gait Relieving factors: meds, heat  PRECAUTIONS: Other: chronic fatigue syndrome, fibromyalgia, cervical disc replacement and fusion    PLOF: Independent.  Pt has chronic lumbar pain though reports she was able to perform her daily activities and functional mobility with much less pain and improved ease prior to this exacerbation.  Pt had covid in January and staets she never returned to her full capacity.  She reports returning to about 70% of her normal.      OBJECTIVE:    TODAY'S TREATMENT: Pt seen for aquatic therapy today.  Treatment took place in water 3.5-4.8 ft in depth at the [employer] pool. Temp of water was 91.  Pt entered/exited the pool via stairs step to pattern independently with bilat rail.    Warm up walking forward, back and side stepping  Core strengthening  Pilates modified plank instruction. Fair execution, improves with practice. Able to hold x 20 seconds x3 -Lat pull down 3 x5. VC, demonstration and manual assist for proper execution.   [place] Pt with lumbar belt around hips, noodle, squoodle and nek doodle for neck support.  .  Pt assisted into supine floating position . PT at torso and assisting with trunk left to right and vice versa to engage trunk muscles stretching and strengthening. PT then rotated trunk in order to engage abdomnal (internal and external obliques) Posterior core stretching in above position: assisted knee to chest the right and left rotation. Able to manually assist pt by pulling on noodle underarms for improved level of stretch -manual QL stretch R/L     Pt requires buoyancy for support and to offload joints with strengthening exercises. Viscosity of the water is needed for resistance of strengthening; water current perturbations provides challenge to standing balance unsupported, requiring increased core activation.    PATIENT SURVEYS:  Modified Oswestry 52%  1:3, 2:1, 3:2, 4:3,  5:3, 6:3, 7:2, 8:2, 9:4, 10:3.       LUMBARAROM/PROM  A/PROM AROM  09/21/2021  Flexion 75%  Extension WFL  Right lateral flexion 90%  Left lateral flexion 80% with pain  Right rotation 50%  Left rotation 50%   (Blank rows = not tested)     LE MMT:  MMT Right 09/21/2021 Left 09/21/2021  Hip flexion 5/5 Initially 5/5 with pain Currently 4+/5 with pain   Hip extension    Hip abduction    Hip adduction    Hip internal rotation    Hip external rotation 5/5 5/5  Knee flexion 5/5 tested in sitting 4+/5 tested in sitting Initially. 5/5 currently in sitting  Knee extension 5/5 5/5   Ankle dorsiflexion 4+/5 4+/5  Ankle plantarflexion Able to tolerate good resistance; not shaky today Able to tolerate good resistance; not shaky today  Ankle inversion    Ankle eversion     (Blank rows = not tested)   GAIT: Assistive device utilized: None Level of assistance: Complete Independence Comments: pt ambulates with a stiff gait pattern, decreased pelvic rotation bilat, and decreased reciprocal arm swing.  No increased pain with turning with gait     PATIENT EDUCATION:  Education details: Educated pt concerning POC.  Answered Pt's questions.  Objective findings and objective progress/deficits.  Instructed pt to  be compliant with HEP.  Person educated: Patient  Education method: Explanation Education comprehension: verbalized understanding   HOME EXERCISE PROGRAM:  Access Code: [email or web address] URL: [email or web address] Date: 08/31/2021 Prepared by: Ronny Flurry   Exercises Supine Transversus Abdominis Bracing - Hands on Stomach - 2-3 x daily - 7 x weekly - 1 sets - 10 reps - 5 seconds hold Supine March - 1-2 x daily - 7 x weekly - 2 sets - 10 reps Supine Posterior Pelvic Tilt - 2 x daily - 7 x weekly - 2 sets - 10 reps Hooklying Lumbar Rotation - 2 x daily - 7 x weekly - 2 sets - 10 reps  ASSESSMENT:  CLINICAL IMPRESSION: Pt encouraged by improvements in  strength as per testing last visit.  She continues to see little improvement in pain with therapy intervention.  MRI scheduled and her intension is to make decision once results return as far as continued PT. Will discuss with primary therapist next visit in regards to remaining sessions scheduled.  Is hoping to return to therapy once MD intervention established and complete.     Objective impairments include Abnormal gait, decreased activity tolerance, decreased endurance, decreased mobility, difficulty walking, decreased ROM, decreased strength, hypomobility, impaired flexibility, and pain. These impairments are limiting patient from cleaning, meal prep, laundry, and ambulation and standing activities . Personal factors including 3+ comorbidities: Chronic fatigue syndrome, fibromyalgia, chronic pain, and cervical disc replacement and fusion  are also affecting patient's functional outcome.      GOALS:   SHORT TERM GOALS:  STG Name Target Date Goal status  1 Pt will tolerate aquatic therapy without adverse effects in order to improve pain, strength, mobility, and tolerance to activity.  Baseline:  09/07/2021 GOAL MET  2 Pt will demo improved lumbar SB and Rotation AROM to be [facility] bilat for improved mobility and stiffness.  Baseline:  09/14/2021 ONGOING  3 Pt will be able to stand to wash dishes without significant pain. Baseline:  (Goal changed to washing dishes since pt's husband does the cooking) 09/14/2021  PROGRESSING  4 Pt's worst pain will be less than 8/10 for improved tolerance to activity Baseline: 09/07/2021 NOT MET  5 Pt will be independent with land based HEP for improved pain, strength, ROM, and function.  Baseline: 09/07/2021 NOT MET             LONG TERM GOALS:   LTG Name Target Date Goal status  1 Pt will be able to apply weight thru L LE with stairs without increased pain and perform stairs with a reciprocal gait with rails.  Baseline: 11/31/2022 NOT MET  2 Pt will be  able to perform her normal household chores without significant pain or difficulty.   Baseline: 11/31/2022 NOT MET  3 Pt will demo proper body mechanics with squat to lift/hip hinge technique for improved functional lifting including picking up her dog. Baseline: 11/31/2022 NOT ASSESSED  4 Pt will be able to turn with gait without increased pain. Baseline: 11/31/2022 GOAL MET  5 Pt will demo improved core strength by progressing with aquatic exercises and land based core exercises without adverse effects for improved performance of and tolerance with functional mobility skills and daily activities.    Baseline: 11/31/2022 ONGOING  6 Pt will report at least a 60-70% improvement overall in pain and sx's.   Baseline: 11/31/2022 PROGRESSING        PLAN: PT FREQUENCY:  2x per week   PT DURATION: other: 4 weeks  PLANNED INTERVENTIONS:  Therapeutic exercises, Therapeutic activity, Neuro Muscular re-education, Gait training, Patient/Family education, Joint mobilization, Stair training, Aquatic Therapy, Dry Needling, Electrical stimulation, Spinal mobilization, Cryotherapy, Moist heat, Taping, Ultrasound, and Manual therapy  PLAN FOR NEXT SESSION: Cont with PT  or put on hold until results from MRI??   Annamarie Major) Sabriah Hobbins MPT 09/21/21 6:08 PM

## 2021-09-22 ENCOUNTER — Other Ambulatory Visit: Payer: Self-pay | Admitting: Cardiology

## 2021-09-26 DIAGNOSIS — M5451 Vertebrogenic low back pain: Secondary | ICD-10-CM | POA: Diagnosis not present

## 2021-09-27 ENCOUNTER — Ambulatory Visit (HOSPITAL_BASED_OUTPATIENT_CLINIC_OR_DEPARTMENT_OTHER): Payer: Medicare Other | Admitting: Physical Therapy

## 2021-09-28 ENCOUNTER — Ambulatory Visit: Payer: Medicare Other | Admitting: Cardiology

## 2021-09-29 ENCOUNTER — Encounter (HOSPITAL_BASED_OUTPATIENT_CLINIC_OR_DEPARTMENT_OTHER): Payer: Medicare Other | Admitting: Physical Therapy

## 2021-09-30 DIAGNOSIS — M5459 Other low back pain: Secondary | ICD-10-CM | POA: Diagnosis not present

## 2021-09-30 DIAGNOSIS — M5451 Vertebrogenic low back pain: Secondary | ICD-10-CM | POA: Diagnosis not present

## 2021-10-03 DIAGNOSIS — M5451 Vertebrogenic low back pain: Secondary | ICD-10-CM | POA: Diagnosis not present

## 2021-10-04 ENCOUNTER — Encounter: Payer: Self-pay | Admitting: Family Medicine

## 2021-10-04 ENCOUNTER — Ambulatory Visit (INDEPENDENT_AMBULATORY_CARE_PROVIDER_SITE_OTHER): Payer: Medicare Other | Admitting: Family Medicine

## 2021-10-04 ENCOUNTER — Other Ambulatory Visit: Payer: Self-pay

## 2021-10-04 VITALS — BP 140/72 | HR 90 | Temp 96.8°F | Ht 65.0 in | Wt 189.4 lb

## 2021-10-04 DIAGNOSIS — M5442 Lumbago with sciatica, left side: Secondary | ICD-10-CM

## 2021-10-04 DIAGNOSIS — Z20822 Contact with and (suspected) exposure to covid-19: Secondary | ICD-10-CM | POA: Diagnosis not present

## 2021-10-04 DIAGNOSIS — G8929 Other chronic pain: Secondary | ICD-10-CM | POA: Diagnosis not present

## 2021-10-04 DIAGNOSIS — I471 Supraventricular tachycardia: Secondary | ICD-10-CM | POA: Diagnosis not present

## 2021-10-04 DIAGNOSIS — M797 Fibromyalgia: Secondary | ICD-10-CM | POA: Diagnosis not present

## 2021-10-04 DIAGNOSIS — G9332 Myalgic encephalomyelitis/chronic fatigue syndrome: Secondary | ICD-10-CM | POA: Diagnosis not present

## 2021-10-04 NOTE — Progress Notes (Signed)
Provider:  Alain Honey, MD  Careteam: Patient Care Team: Wardell Honour, MD as PCP - General (Family Medicine) Jerline Pain, MD as PCP - Cardiology (Cardiology) Warren Danes, PA-C as Physician Assistant (Dermatology) Tiajuana Amass, MD as Referring Physician (Allergy and Immunology) Ronald Lobo, MD as Consulting Physician (Gastroenterology) Ralene Ok, MD as Consulting Physician (General Surgery) Melina Schools, MD as Consulting Physician (Orthopedic Surgery) Baxter Kail, MD (Internal Medicine) Regal, Tamala Fothergill, DPM as Consulting Physician (Podiatry) Jacelyn Pi, MD as Referring Physician (Endocrinology) Sanjuana Kava, MD as Referring Physician (Obstetrics and Gynecology) Fonnie Mu, OD (Optometry) Rana Snare, MD (Inactive) (Urology) Blima Dessert, MD as Referring Physician (Rheumatology)  PLACE OF SERVICE:  [place]  Advanced Directive information    Allergies  Allergen Reactions   Bee Venom Shortness Of Breath, Itching and Swelling   Peanut-Containing Drug Products Anaphylaxis   Ambien [Zolpidem Tartrate] Other (See Comments)    Hallucinations   Tape Itching, Swelling and Rash   Ciprofloxacin Nausea And Vomiting   Latex Rash   Minocycline Rash   Shellfish Allergy Rash    Chief Complaint  Patient presents with   Surgical Clearance    Patient reports surgery in January,2023 with [facility]     HPI: Patient is a 65 y.o. female .  Seems to be doing well from point of view of fibromyalgia.  She is having more back pain and planned surgery in January for disc disease with lumbar fusion.  She presents today for clearance for that surgery.  She has had supraventricular tachycardia in the past but none in many years since she has been on metoprolol.  There are no issues with asthma or breathing difficulties  Review of Systems:  Review of Systems  Constitutional:  Positive for malaise/fatigue.  Eyes: Negative.   Respiratory:  Negative.    Cardiovascular: Negative.   Genitourinary: Negative.   Musculoskeletal:  Positive for back pain and myalgias.  All other systems reviewed and are negative.  Past Medical History:  Diagnosis Date   Asthma    Back pain    Chronic fatigue syndrome    Dr. Sabra Heck   Complication of anesthesia    Cough    /SOB, PFT's nl, improved with Bronchodilation 8/11   Dysrhythmia    Endometrial cancer ([place])    Esophageal spasm    GERD Dr. Sabra Heck, Dr. Wynetta Emery; egd 2006 nl; UGI 3/13 Small HH, moderate GERD, nl motility; seen at [facility] ([place]), egd? neg, sone improvement on sucralfate susp   Fibromyalgia    Foot pain    Dr. Rushie Nyhan   Hashimoto's thyroiditis    High triglycerides    Hypothyroidism    Dr. Lewis Shock 4/14   Leukocytoclastic vasculitis ([place])    Dr. Tonia Brooms   Low HDL (under 40)    Memory loss    Mitral valve prolapse    Paroxysmal atrial tachycardia ([place])    Dr. Marlou Porch   Pernicious anemia    Dr. Sabra Heck   PONV (postoperative nausea and vomiting)    along time ago had nausea ans vomiting after surgery-25 years ago   Reflux    ? delayed gastric emptying--GES nl 01/2012 (6% at 2hrs)   RLS (restless legs syndrome)    low ferritin Dr. Sabra Heck   Rosacea    Sleep apnea    Uterine carcinoma ([place])    Dr. Polly Cobia   Past Surgical History:  Procedure Laterality Date   ABDOMINAL HYSTERECTOMY     bladder tack  x 2   BUNIONECTOMY     CHOLECYSTECTOMY  2009   COLONOSCOPY     scr, 12/2008 ([facility]) nl   COLOSTOMY Left 06/05/2018   Procedure: COLOSTOMY;  Surgeon: Ralene Ok, MD;  Location: [place];  Service: General;  Laterality: Left;   COLOSTOMY REVERSAL N/A 10/16/2018   Procedure: HARTMANN'S COLOSTOMY REVERSAL, RIGID PROCTOSCOPY;  Surgeon: Ralene Ok, MD;  [facility];  Service: General;  Laterality: N/A;   HAMMER TOE SURGERY     HYSTERECTOMY ABDOMINAL WITH SALPINGO-OOPHORECTOMY  2008   [place]  04/09/2019   x2   [place] N/A  04/09/2019   Procedure: INCISIONAL HERNIA REPAIR WITH MESH;  Surgeon: Ralene Ok, MD;  Location: [place];  Service: General;  Laterality: N/A;   LAPAROSCOPY N/A 10/16/2018   Procedure: LAPAROSCOPY DIAGNOSTIC WITH LYSIS OF ADHESIONS;  Surgeon: Ralene Ok, MD;  [facility];  Service: General;  Laterality: N/A;   LUNG BIOPSY     L Lower lung pulm nodules, largest 4.42mm, low risk, repeat CT in 1 yr now following with pulm at [facility]; 9/13 Stable tiny lung nodules, no f/u needed   LYMPH NODE DISSECTION  2008   LYSIS OF ADHESION N/A 04/09/2019   Procedure: OPEN LYSIS OF ADHESION;  Surgeon: Ralene Ok, MD;  Location: [place];  Service: General;  Laterality: N/A;   METATARSAL OSTEOTOMY     NECK SURGERY     x 2   PARTIAL COLECTOMY N/A 06/05/2018   Procedure: EXPLORATORY LAPAROTOMY AND PARTIAL COLECTOMY;  Surgeon: Ralene Ok, MD;  Location: [place];  Service: General;  Laterality: N/A;   TONSILLECTOMY     US ECHOCARDIOGRAPHY     with Nuclear test, Dr. Marlou Porch, Low risk   VESICOVAGINAL FISTULA CLOSURE W/ TAH     Social History:   reports that she has never smoked. She has never used smokeless tobacco. She reports current alcohol use. She reports that she does not use drugs.  Family History  Problem Relation Age of Onset   Hashimoto's thyroiditis Mother    High blood pressure Mother    Stroke Mother    Other Mother        Borderline DM   Alzheimer's disease Mother    Ulcerative colitis Father    Meniere's disease Father    Hearing loss Father    Stroke Father    Stroke Sister     Medications: Patient's Medications  New Prescriptions   No medications on file  Previous Medications   ACETAMINOPHEN (TYLENOL) 500 MG TABLET    Take 2 tablets (1,000 mg total) by mouth every 6 (six) hours as needed.   ALBUTEROL (PROVENTIL HFA;VENTOLIN HFA) 108 (90 BASE) MCG/ACT INHALER    Inhale 2 puffs into the lungs every 4 (four) hours as needed for wheezing or shortness of breath.    ARIPIPRAZOLE (ABILIFY) 2 MG TABLET    Take 1 tablet (2 mg total) by mouth daily.   ASPIRIN EC 81 MG TABLET    Take 1 tablet (81 mg total) by mouth daily.   ATORVASTATIN (LIPITOR) 10 MG TABLET    TAKE 1 TABLET BY MOUTH  DAILY   AZELAIC ACID (FINACEA EX)    Apply 1 application topically daily.   BUPROPION (WELLBUTRIN XL) 150 MG 24 HR TABLET    Take 450 mg by mouth daily.    CHOLECALCIFEROL (VITAMIN D3) 25 MCG (1000 UT) CAPS    Take 2,000 Units by mouth 2 (two) times a day.  CYANOCOBALAMIN (,VITAMIN B-12,) 1000 MCG/ML INJECTION    Inject 1 mL (1,000 mcg total) into the muscle every 30 (thirty) days.   CYCLOBENZAPRINE (FLEXERIL) 10 MG TABLET    Take 1 tablet (10 mg total) by mouth 3 (three) times daily as needed for muscle spasms.   DICLOFENAC SODIUM (VOLTAREN) 1 % GEL    Apply 2 g topically 3 (three) times daily as needed (pain).    DOXYCYCLINE (PERIOSTAT) 20 MG TABLET    Take 20 mg by mouth 2 (two) times daily.   DULOXETINE (CYMBALTA) 30 MG CAPSULE    Take 90 mg by mouth at bedtime.    EPINEPHRINE 0.3 MG/0.3 ML IJ SOAJ INJECTION    Inject 0.3 mg into the muscle as needed for anaphylaxis.    FLUTICASONE-SALMETEROL (ADVAIR HFA) 115-21 MCG/ACT INHALER    Inhale 2 puffs into the lungs 2 (two) times daily.   GABAPENTIN (NEURONTIN) 300 MG CAPSULE    Take 300 mg by mouth 3 (three) times daily.   INSULIN SYRINGE-NEEDLE U-100 (SAFETY INSULIN SYRINGES) 27G X 1/2" 1 ML MISC    by Does not apply route.   IPRATROPIUM (ATROVENT) 0.06 % NASAL SPRAY    ipratropium bromide 42 mcg (0.06 %) nasal spray   LEVOTHYROXINE (SYNTHROID) 150 MCG TABLET    Take 150 mcg by mouth at bedtime.   METOPROLOL SUCCINATE (TOPROL-XL) 25 MG 24 HR TABLET    TAKE 1 TABLET BY MOUTH ONCE DAILY   METRONIDAZOLE (METROGEL) 0.75 % GEL    Apply 1 application topically 2 (two) times daily.   NAPROXEN SODIUM (ALEVE) 220 MG TABLET    Take 220 mg by mouth at bedtime as needed (headache).   NEEDLES & SYRINGES MISC    Subcutaneous Syringes and  Needles to Inject Vitamin B12 Monthly.   NITROGLYCERIN (NITROSTAT) 0.4 MG SL TABLET    Place 1 tablet (0.4 mg total) under the tongue every 5 (five) minutes as needed for chest pain.   PANTOPRAZOLE (PROTONIX) 40 MG TABLET    Take 40 mg by mouth 2 (two) times daily.   PREDNISONE (DELTASONE) 20 MG TABLET    2 po at same time daily for 5 days   PREGABALIN (LYRICA) 75 MG CAPSULE    Take 75 mg by mouth 2 (two) times daily.   PROBIOTIC PRODUCT (PROBIOTIC PO)    Take 2 capsules by mouth daily.   TRAZODONE (DESYREL) 50 MG TABLET    Take 50 mg by mouth as needed for sleep. Per patient taking 1/4 to 1/2 tablet as needed  Modified Medications   No medications on file  Discontinued Medications   No medications on file    Physical Exam:  Vitals:   10/04/21 1031  BP: 140/72  Pulse: 90  Temp: (!) 96.8 F (36 C)  SpO2: 98%  Weight: 189 lb 6.4 oz (85.9 kg)  Height: 5\' 5"  (1.651 m)   Body mass index is 31.52 kg/m. Wt Readings from Last 3 Encounters:  10/04/21 189 lb 6.4 oz (85.9 kg)  08/02/21 189 lb 6.4 oz (85.9 kg)  07/05/21 184 lb 3.2 oz (83.6 kg)    Physical Exam Vitals and nursing note reviewed.  Constitutional:      Appearance: Normal appearance.  Cardiovascular:     Rate and Rhythm: Normal rate and regular rhythm.  Pulmonary:     Effort: Pulmonary effort is normal.     Breath sounds: Normal breath sounds.  Neurological:     General: No focal deficit present.  Mental Status: She is alert and oriented to person, place, and time.    Labs reviewed: Basic Metabolic Panel: No results for input(s): NA, K, CL, CO2, GLUCOSE, BUN, CREATININE, CALCIUM, MG, PHOS, TSH in the last 8760 hours. Liver Function Tests: No results for input(s): AST, ALT, ALKPHOS, BILITOT, PROT, ALBUMIN in the last 8760 hours. No results for input(s): LIPASE, AMYLASE in the last 8760 hours. No results for input(s): AMMONIA in the last 8760 hours. CBC: No results for input(s): WBC, NEUTROABS, HGB, HCT, MCV,  PLT in the last 8760 hours. Lipid Panel: No results for input(s): CHOL, HDL, LDLCALC, TRIG, CHOLHDL, LDLDIRECT in the last 8760 hours. TSH: No results for input(s): TSH in the last 8760 hours. A1C: Lab Results  Component Value Date   HGBA1C 5.6 10/14/2018     Assessment/Plan  1. Chronic left-sided low back pain with left-sided sciatica Scheduled for surgery as noted above exam done for medical clearance today.  Is also going to see a vascular surgeon for reasons I am not sure but has had previous abdominal surgeries in the past  2. Chronic fatigue syndrome Seems to be doing well from point of view of this problem continues on gabapentin muscle relaxant and Abilify  3. Fibromyalgia See above  4. Paroxysmal atrial tachycardia (HCC) Currently taking metoprolol 25 mg and no issues with tachycardia   Alain Honey, MD [place] [phone]

## 2021-10-13 ENCOUNTER — Encounter: Payer: Self-pay | Admitting: Cardiology

## 2021-10-13 DIAGNOSIS — L501 Idiopathic urticaria: Secondary | ICD-10-CM | POA: Diagnosis not present

## 2021-10-17 ENCOUNTER — Other Ambulatory Visit: Payer: Self-pay

## 2021-10-17 DIAGNOSIS — R011 Cardiac murmur, unspecified: Secondary | ICD-10-CM | POA: Insufficient documentation

## 2021-10-17 DIAGNOSIS — N952 Postmenopausal atrophic vaginitis: Secondary | ICD-10-CM | POA: Insufficient documentation

## 2021-10-17 DIAGNOSIS — L720 Epidermal cyst: Secondary | ICD-10-CM | POA: Insufficient documentation

## 2021-10-17 DIAGNOSIS — J3081 Allergic rhinitis due to animal (cat) (dog) hair and dander: Secondary | ICD-10-CM | POA: Insufficient documentation

## 2021-10-17 DIAGNOSIS — L501 Idiopathic urticaria: Secondary | ICD-10-CM | POA: Insufficient documentation

## 2021-10-17 DIAGNOSIS — N941 Unspecified dyspareunia: Secondary | ICD-10-CM | POA: Insufficient documentation

## 2021-10-17 DIAGNOSIS — D134 Benign neoplasm of liver: Secondary | ICD-10-CM | POA: Insufficient documentation

## 2021-10-17 DIAGNOSIS — J309 Allergic rhinitis, unspecified: Secondary | ICD-10-CM | POA: Insufficient documentation

## 2021-10-17 DIAGNOSIS — N898 Other specified noninflammatory disorders of vagina: Secondary | ICD-10-CM | POA: Insufficient documentation

## 2021-10-17 DIAGNOSIS — Z91010 Allergy to peanuts: Secondary | ICD-10-CM | POA: Insufficient documentation

## 2021-10-17 DIAGNOSIS — K219 Gastro-esophageal reflux disease without esophagitis: Secondary | ICD-10-CM | POA: Insufficient documentation

## 2021-10-17 DIAGNOSIS — J4540 Moderate persistent asthma, uncomplicated: Secondary | ICD-10-CM | POA: Insufficient documentation

## 2021-10-17 DIAGNOSIS — N951 Menopausal and female climacteric states: Secondary | ICD-10-CM | POA: Insufficient documentation

## 2021-11-01 ENCOUNTER — Ambulatory Visit (INDEPENDENT_AMBULATORY_CARE_PROVIDER_SITE_OTHER): Payer: Medicare Other | Admitting: Vascular Surgery

## 2021-11-01 ENCOUNTER — Encounter: Payer: Self-pay | Admitting: Vascular Surgery

## 2021-11-01 ENCOUNTER — Other Ambulatory Visit: Payer: Self-pay

## 2021-11-01 VITALS — BP 138/86 | HR 81 | Temp 96.0°F | Resp 16 | Ht 65.0 in | Wt 195.0 lb

## 2021-11-01 DIAGNOSIS — G8929 Other chronic pain: Secondary | ICD-10-CM | POA: Diagnosis not present

## 2021-11-01 DIAGNOSIS — M5442 Lumbago with sciatica, left side: Secondary | ICD-10-CM | POA: Diagnosis not present

## 2021-11-01 NOTE — Progress Notes (Deleted)
Patient name: [name] MRN: [id number] DOB: Dec 16, 1955 Sex: female  REASON FOR CONSULT: ***  HPI: [name] is a 66 y.o. female, ***  Past Medical History:  Diagnosis Date   Asthma    Back pain    Chronic fatigue syndrome    Dr. Sabra Heck   Complication of anesthesia    Cough    /SOB, PFT's nl, improved with Bronchodilation 8/11   Dysrhythmia    Endometrial cancer ([place])    Esophageal spasm    GERD Dr. Sabra Heck, Dr. Wynetta Emery; egd 2006 nl; UGI 3/13 [facility], moderate GERD, nl motility; seen at [facility] ([place] egd? neg, sone improvement on sucralfate susp   Fibromyalgia    Foot pain    Dr. Rushie Nyhan   Hashimoto's thyroiditis    High triglycerides    Hypothyroidism    Dr. Lewis Shock 4/14   Leukocytoclastic vasculitis ([place])    Dr. Tonia Brooms   Low HDL (under 40)    Memory loss    Mitral valve prolapse    Paroxysmal atrial tachycardia ([place])    Dr. Marlou Porch   Pernicious anemia    Dr. Sabra Heck   PONV (postoperative nausea and vomiting)    along time ago had nausea ans vomiting after surgery-25 years ago   Reflux    ? delayed gastric emptying--GES nl 01/2012 (6% at 2hrs)   RLS (restless legs syndrome)    low ferritin Dr. Sabra Heck   Rosacea    Sleep apnea    Uterine carcinoma ([place])    Dr. Polly Cobia    Past Surgical History:  Procedure Laterality Date   ABDOMINAL HYSTERECTOMY     bladder tack     x 2   BUNIONECTOMY     CHOLECYSTECTOMY  2009   COLONOSCOPY     scr, 12/2008 ([place]) nl   COLOSTOMY Left 06/05/2018   Procedure: COLOSTOMY;  Surgeon: Ralene Ok, MD;  Location: [place];  Service: General;  Laterality: Left;   COLOSTOMY REVERSAL N/A 10/16/2018   Procedure: HARTMANN'S COLOSTOMY REVERSAL, RIGID PROCTOSCOPY;  Surgeon: Ralene Ok, MD;  Location: [facility];  Service: General;  Laterality: N/A;   HAMMER TOE SURGERY     HYSTERECTOMY ABDOMINAL WITH SALPINGO-OOPHORECTOMY  2008   [place]  04/09/2019   x2   [place] N/A 04/09/2019    Procedure: INCISIONAL HERNIA REPAIR WITH MESH;  Surgeon: Ralene Ok, MD;  Location: [place];  Service: General;  Laterality: N/A;   LAPAROSCOPY N/A 10/16/2018   Procedure: LAPAROSCOPY DIAGNOSTIC WITH LYSIS OF ADHESIONS;  Surgeon: Ralene Ok, MD;  Location: [facility];  Service: General;  Laterality: N/A;   LUNG BIOPSY     L Lower lung pulm nodules, largest 4.39mm, low risk, repeat CT in 1 yr now following with pulm at [facility]; 9/13 Stable tiny lung nodules, no f/u needed   LYMPH NODE DISSECTION  2008   LYSIS OF ADHESION N/A 04/09/2019   Procedure: OPEN LYSIS OF ADHESION;  Surgeon: Ralene Ok, MD;  Location: [place];  Service: General;  Laterality: N/A;   METATARSAL OSTEOTOMY     NECK SURGERY     x 2   PARTIAL COLECTOMY N/A 06/05/2018   Procedure: EXPLORATORY LAPAROTOMY AND PARTIAL COLECTOMY;  Surgeon: Ralene Ok, MD;  Location: [place];  Service: General;  Laterality: N/A;   TONSILLECTOMY     US ECHOCARDIOGRAPHY     with Nuclear test, Dr. Marlou Porch, Low risk   VESICOVAGINAL FISTULA CLOSURE W/ TAH  Family History  Problem Relation Age of Onset   Hashimoto's thyroiditis Mother    High blood pressure Mother    Stroke Mother    Other Mother        Borderline DM   Alzheimer's disease Mother    Ulcerative colitis Father    Meniere's disease Father    Hearing loss Father    Stroke Father    Stroke Sister     SOCIAL HISTORY: Social History   Socioeconomic History   Marital status: Married    Spouse name: Not on file   Number of children: Not on file   Years of education: Not on file   Highest education level: Not on file  Occupational History   Not on file  Tobacco Use   Smoking status: Never   Smokeless tobacco: Never  Vaping Use   Vaping Use: Never used  Substance and Sexual Activity   Alcohol use: Yes    Comment: maybe a glass of wine/month   Drug use: Never   Sexual activity: Not on file  Other Topics Concern   Not on file  Social History Narrative    Diet: High calorie/ high fat      Caffeine: Yes      Married, if yes what year: Married, 1986      Do you live in a house, apartment, assisted living, condo, trailer, ect: House      Is it one or more stories: One story      How many persons live in your home? 2      Pets: 2      Highest level or education completed: completed college, some graduate school      Current/Past profession: Retail-Division management      Exercise: Rarely                 Type and how often: Not often- experience PEM         Living Will:    DNR:   POA/HPOA:      Functional Status:   Do you have difficulty bathing or dressing yourself? No   Do you have difficulty preparing food or eating?No   Do you have difficulty managing your medications?No   Do you have difficulty managing your finances?No   Do you have difficulty affording your medications?No   Social Determinants of Radio broadcast assistant Strain: Not on file  Food Insecurity: Not on file  Transportation Needs: Not on file  Physical Activity: Not on file  Stress: Not on file  Social Connections: Not on file  Intimate Partner Violence: Not on file    Allergies  Allergen Reactions   Aspirin Anaphylaxis   Bee Venom Shortness Of Breath, Itching, Swelling and Hives   Peanut-Containing Drug Products Anaphylaxis   Ambien [Zolpidem Tartrate] Other (See Comments)    Hallucinations   Tape Itching, Swelling and Rash   Ciprofloxacin Nausea And Vomiting   Latex Rash   Minocycline Rash   Shellfish Allergy Rash    Current Outpatient Medications  Medication Sig Dispense Refill   acetaminophen (TYLENOL) 500 MG tablet Take 2 tablets (1,000 mg total) by mouth every 6 (six) hours as needed. 30 tablet 0   albuterol (PROVENTIL HFA;VENTOLIN HFA) 108 (90 Base) MCG/ACT inhaler Inhale 2 puffs into the lungs every 4 (four) hours as needed for wheezing or shortness of breath.     ARIPiprazole (ABILIFY) 2 MG tablet Take 1 tablet (2 mg total) by  mouth daily. [place]  tablet 1   Ascorbic Acid (VITAMIN C ER PO)      aspirin EC 81 MG tablet Take 1 tablet (81 mg total) by mouth daily. 90 tablet 3   atorvastatin (LIPITOR) 10 MG tablet TAKE 1 TABLET BY MOUTH  DAILY 90 tablet 3   buPROPion (WELLBUTRIN XL) 150 MG 24 hr tablet Take 450 mg by mouth daily.      Calcium Acetate, Phos Binder, (CALCIUM ACETATE PO)      Cholecalciferol (VITAMIN D3) 25 MCG (1000 UT) CAPS Take 2,000 Units by mouth 2 (two) times a day.      cyanocobalamin (,VITAMIN B-12,) 1000 MCG/ML injection Inject 1 mL (1,000 mcg total) into the muscle every 30 (thirty) days. 1 mL 1   cyclobenzaprine (FLEXERIL) 10 MG tablet Take 1 tablet (10 mg total) by mouth 3 (three) times daily as needed for muscle spasms. 30 tablet 0   Docusate Calcium (STOOL SOFTENER PO)      doxycycline (PERIOSTAT) 20 MG tablet Take 20 mg by mouth 2 (two) times daily.     DULoxetine (CYMBALTA) 30 MG capsule Take 90 mg by mouth at bedtime.      EPINEPHrine 0.3 mg/0.3 mL IJ SOAJ injection Inject 0.3 mg into the muscle as needed for anaphylaxis.      fexofenadine (ALLEGRA) 180 MG tablet 1 tablet as needed     fluticasone-salmeterol (ADVAIR HFA) 115-21 MCG/ACT inhaler Inhale 2 puffs into the lungs 2 (two) times daily.     gabapentin (NEURONTIN) 300 MG capsule Take 300 mg by mouth 3 (three) times daily.     ipratropium (ATROVENT) 0.06 % nasal spray ipratropium bromide 42 mcg (0.06 %) nasal spray     levothyroxine (SYNTHROID) 150 MCG tablet Take 150 mcg by mouth at bedtime.     metoprolol succinate (TOPROL-XL) 25 MG 24 hr tablet TAKE 1 TABLET BY MOUTH ONCE DAILY 90 tablet 3   metroNIDAZOLE (METROGEL) 0.75 % gel Apply 1 application topically 2 (two) times daily.     naproxen sodium (ALEVE) 220 MG tablet Take 220 mg by mouth at bedtime as needed (headache).     nitroGLYCERIN (NITROSTAT) 0.4 MG SL tablet Place 1 tablet (0.4 mg total) under the tongue every 5 (five) minutes as needed for chest pain. 25 tablet 3   omalizumab  (XOLAIR) 150 MG injection See admin instructions.     pantoprazole (PROTONIX) 40 MG tablet Take 40 mg by mouth 2 (two) times daily.     Polyethylene Glycol 3350 (MIRALAX PO)      pramipexole (MIRAPEX) 0.125 MG tablet pramipexole 0.125 mg tablet  TAKE ONE TABLET BY MOUTH AT BEDTIME ,INCREASE BY ONE TABLET EVERY 2-3 DAYS TO MAX OF 0.75MG  (SIX TABLETS). 30     pregabalin (LYRICA) 75 MG capsule Take 75 mg by mouth 2 (two) times daily.     Probiotic Product (PROBIOTIC PO) Take 2 capsules by mouth daily.     Syringe, Disposable, (B-D SYRINGE LUER-LOK 3CC) 3 ML MISC BD Luer-Lok Syringe 3 mL 25 gauge x 1"  Subcutaneous Syringes and Needles to Inject Vitamin B12 Monthly.     Adapalene (DIFFERIN EX)      ALPRAZolam (XANAX) 1 MG tablet (Schedule IV Drug)     Azelaic Acid (FINACEA EX) Apply 1 application topically daily.     azelastine (ASTELIN) 0.1 % nasal spray azelastine 137 mcg (0.1 %) nasal spray aerosol  ONE PUFF IN EACH NOSTRIL DAILY.     B-D 3CC LUER-LOK SYR 25GX1" 25G X 1"  3 ML MISC SMARTSIG:Syringe(s) SUB-Q     baclofen (LIORESAL) 10 MG tablet baclofen 10 mg tablet  TAKE ONE TABLET THREE TIMES DAILY AS NEEDED.     buPROPion (ZYBAN) 150 MG 12 hr tablet 1 tablet in the morning     cetirizine (ZYRTEC ALLERGY) 10 MG tablet 1 tablet     diclofenac sodium (VOLTAREN) 1 % GEL Apply 2 g topically 3 (three) times daily as needed (pain).      Ferrous Sulfate Dried (FERROUS SULFATE CR PO)      Hydroxychloroquine Sulfate (PLAQUENIL PO)      Insulin Syringe-Needle U-100 (SAFETY INSULIN SYRINGES) 27G X 1/2" 1 ML MISC by Does not apply route.     LEVOTHYROXINE SODIUM PO      methylPREDNISolone (MEDROL) 4 MG tablet methylprednisolone 4 mg tablets in a dose pack  TAKE AS DIRECTED PER pack instructions     Needles & Syringes MISC Subcutaneous Syringes and Needles to Inject Vitamin B12 Monthly. 3 each 3   predniSONE (DELTASONE) 20 MG tablet 2 po at same time daily for 5 days 10 tablet 0   simvastatin (ZOCOR)  20 MG tablet Take 1 tablet by mouth at bedtime.     sucralfate (CARAFATE) 1 g tablet sucralfate 1 gram tablet  TAKE ONE TABLET ON AN EMPTY STOMACH THREE TIMES DAILY 30 MINUTES BEFORE MEALS.     traZODone (DESYREL) 50 MG tablet Take 50 mg by mouth as needed for sleep. Per patient taking 1/4 to 1/2 tablet as needed     No current facility-administered medications for this visit.    REVIEW OF SYSTEMS:  [X]  denotes positive finding, [ ]  denotes negative finding Cardiac  Comments:  Chest pain or chest pressure: ***   Shortness of breath upon exertion:    Short of breath when lying flat:    Irregular heart rhythm:        Vascular    Pain in calf, thigh, or hip brought on by ambulation:    Pain in feet at night that wakes you up from your sleep:     Blood clot in your veins:    Leg swelling:         Pulmonary    Oxygen at home:    Productive cough:     Wheezing:         Neurologic    Sudden weakness in arms or legs:     Sudden numbness in arms or legs:     Sudden onset of difficulty speaking or slurred speech:    Temporary loss of vision in one eye:     Problems with dizziness:         Gastrointestinal    Blood in stool:     Vomited blood:         Genitourinary    Burning when urinating:     Blood in urine:        Psychiatric    Major depression:         Hematologic    Bleeding problems:    Problems with blood clotting too easily:        Skin    Rashes or ulcers:        Constitutional    Fever or chills:      PHYSICAL EXAM: Vitals:   11/01/21 1018  BP: 138/86  Pulse: 81  Resp: 16  Temp: (!) 96 F (35.6 C)  TempSrc: Temporal  SpO2: 97%  Weight: 195 lb (88.5 kg)  Height:  5\' 5"  (1.651 m)    GENERAL: The patient is a well-nourished female, in no acute distress. The vital signs are documented above. CARDIAC: There is a regular rate and rhythm.  VASCULAR: *** PULMONARY: There is good air exchange bilaterally without wheezing or rales. ABDOMEN: Soft and  non-tender with normal pitched bowel sounds.  MUSCULOSKELETAL: There are no major deformities or cyanosis. NEUROLOGIC: No focal weakness or paresthesias are detected. SKIN: There are no ulcers or rashes noted. PSYCHIATRIC: The patient has a normal affect.  DATA:   ***  Assessment/Plan:  ***   Marty Heck, MD [facility] Office: [phone]

## 2021-11-01 NOTE — Progress Notes (Signed)
Patient name: [name] MRN: [id number] DOB: 1956-04-14 Sex: female  REASON FOR CONSULT: Evaluate for L4-L5 OLIF  HPI: [name] is a 66 y.o. female, with history of fibromyalgia, endometrial cancer, chronic fatigue syndrome, atrial tachycardia that presents for evaluation of L4-L5 OLIF.  She describes severe left buttock and left leg pain ongoing over the last several months.  This has been very debilitating.  She had an MRI on 09/30/2021 that showed multilevel degenerative disc disease most prominent at L4-L5.  She's had significant history of abdominal surgery and has failed conservative management.  Dr. Rolena Infante has asked that I evaluate her for L4-L5 OLIF.  Her abdominal surgical history includes lap cholecystectomy, hysterectomy, ex lap with sigmoid colectomy subsequent reversal of her Hartman's colostomy with incisional hernia repair using retrorectus mesh and bilateral musculocutaneous advancement flaps.  Past Medical History:  Diagnosis Date   Asthma    Back pain    Chronic fatigue syndrome    Dr. Sabra Heck   Complication of anesthesia    Cough    /SOB, PFT's nl, improved with Bronchodilation 8/11   Dysrhythmia    Endometrial cancer ([place])    Esophageal spasm    GERD Dr. Sabra Heck, Dr. Wynetta Emery; egd 2006 nl; UGI 3/13 [facility], moderate GERD, nl motility; seen at [facility] [place] egd? neg, sone improvement on sucralfate susp   Fibromyalgia    Foot pain    Dr. Rushie Nyhan   Hashimoto's thyroiditis    High triglycerides    Hypothyroidism    Dr. Lewis Shock 4/14   Leukocytoclastic vasculitis ([place])    Dr. Tonia Brooms   Low HDL (under 40)    Memory loss    Mitral valve prolapse    Paroxysmal atrial tachycardia ([place])    Dr. Marlou Porch   Pernicious anemia    Dr. Sabra Heck   PONV (postoperative nausea and vomiting)    along time ago had nausea ans vomiting after surgery-25 years ago   Reflux    ? delayed gastric emptying--GES nl 01/2012 (6% at 2hrs)   RLS (restless legs syndrome)    low ferritin  Dr. Sabra Heck   Rosacea    Sleep apnea    Uterine carcinoma ([place])    Dr. Polly Cobia    Past Surgical History:  Procedure Laterality Date   ABDOMINAL HYSTERECTOMY     bladder tack     x 2   BUNIONECTOMY     CHOLECYSTECTOMY  2009   COLONOSCOPY     scr, 12/2008 ([place]) nl   COLOSTOMY Left 06/05/2018   Procedure: COLOSTOMY;  Surgeon: Ralene Ok, MD;  Location: [place];  Service: General;  Laterality: Left;   COLOSTOMY REVERSAL N/A 10/16/2018   Procedure: HARTMANN'S COLOSTOMY REVERSAL, RIGID PROCTOSCOPY;  Surgeon: Ralene Ok, MD;  Location: [facility];  Service: General;  Laterality: N/A;   HAMMER TOE SURGERY     HYSTERECTOMY ABDOMINAL WITH SALPINGO-OOPHORECTOMY  2008   [place]  04/09/2019   x2   [place] N/A 04/09/2019   Procedure: INCISIONAL HERNIA REPAIR WITH MESH;  Surgeon: Ralene Ok, MD;  Location: [place];  Service: General;  Laterality: N/A;   LAPAROSCOPY N/A 10/16/2018   Procedure: LAPAROSCOPY DIAGNOSTIC WITH LYSIS OF ADHESIONS;  Surgeon: Ralene Ok, MD;  Location: [facility];  Service: General;  Laterality: N/A;   LUNG BIOPSY     L Lower lung pulm nodules, largest 4.4mm, low risk, repeat CT in 1 yr now following with pulm at [facility]; 9/13 Stable tiny lung nodules,  no f/u needed   LYMPH NODE DISSECTION  2008   LYSIS OF ADHESION N/A 04/09/2019   Procedure: OPEN LYSIS OF ADHESION;  Surgeon: Ralene Ok, MD;  Location: [place];  Service: General;  Laterality: N/A;   METATARSAL OSTEOTOMY     NECK SURGERY     x 2   PARTIAL COLECTOMY N/A 06/05/2018   Procedure: EXPLORATORY LAPAROTOMY AND PARTIAL COLECTOMY;  Surgeon: Ralene Ok, MD;  Location: [place];  Service: General;  Laterality: N/A;   TONSILLECTOMY     US ECHOCARDIOGRAPHY     with Nuclear test, Dr. Marlou Porch, Low risk   VESICOVAGINAL FISTULA CLOSURE W/ TAH      Family History  Problem Relation Age of Onset   Hashimoto's thyroiditis Mother    High blood pressure Mother    Stroke  Mother    Other Mother        Borderline DM   Alzheimer's disease Mother    Ulcerative colitis Father    Meniere's disease Father    Hearing loss Father    Stroke Father    Stroke Sister     SOCIAL HISTORY: Social History   Socioeconomic History   Marital status: Married    Spouse name: Not on file   Number of children: Not on file   Years of education: Not on file   Highest education level: Not on file  Occupational History   Not on file  Tobacco Use   Smoking status: Never   Smokeless tobacco: Never  Vaping Use   Vaping Use: Never used  Substance and Sexual Activity   Alcohol use: Yes    Comment: maybe a glass of wine/month   Drug use: Never   Sexual activity: Not on file  Other Topics Concern   Not on file  Social History Narrative   Diet: High calorie/ high fat      Caffeine: Yes      Married, if yes what year: Married, 1986      Do you live in a house, apartment, assisted living, condo, trailer, ect: House      Is it one or more stories: One story      How many persons live in your home? 2      Pets: 2      Highest level or education completed: completed college, some graduate school      Current/Past profession: Retail-Division management      Exercise: Rarely                 Type and how often: Not often- experience PEM         Living Will:    DNR:   POA/HPOA:      Functional Status:   Do you have difficulty bathing or dressing yourself? No   Do you have difficulty preparing food or eating?No   Do you have difficulty managing your medications?No   Do you have difficulty managing your finances?No   Do you have difficulty affording your medications?No   Social Determinants of Radio broadcast assistant Strain: Not on file  Food Insecurity: Not on file  Transportation Needs: Not on file  Physical Activity: Not on file  Stress: Not on file  Social Connections: Not on file  Intimate Partner Violence: Not on file    Allergies  Allergen  Reactions   Aspirin Anaphylaxis   Bee Venom Shortness Of Breath, Itching, Swelling and Hives   Peanut-Containing Drug Products Anaphylaxis   Ambien [Zolpidem Tartrate]  Other (See Comments)    Hallucinations   Tape Itching, Swelling and Rash   Ciprofloxacin Nausea And Vomiting   Latex Rash   Minocycline Rash   Shellfish Allergy Rash    Current Outpatient Medications  Medication Sig Dispense Refill   acetaminophen (TYLENOL) 500 MG tablet Take 2 tablets (1,000 mg total) by mouth every 6 (six) hours as needed. 30 tablet 0   albuterol (PROVENTIL HFA;VENTOLIN HFA) 108 (90 Base) MCG/ACT inhaler Inhale 2 puffs into the lungs every 4 (four) hours as needed for wheezing or shortness of breath.     ARIPiprazole (ABILIFY) 2 MG tablet Take 1 tablet (2 mg total) by mouth daily. 90 tablet 1   Ascorbic Acid (VITAMIN C ER PO)      aspirin EC 81 MG tablet Take 1 tablet (81 mg total) by mouth daily. 90 tablet 3   atorvastatin (LIPITOR) 10 MG tablet TAKE 1 TABLET BY MOUTH  DAILY 90 tablet 3   buPROPion (WELLBUTRIN XL) 150 MG 24 hr tablet Take 450 mg by mouth daily.      Calcium Acetate, Phos Binder, (CALCIUM ACETATE PO)      Cholecalciferol (VITAMIN D3) 25 MCG (1000 UT) CAPS Take 2,000 Units by mouth 2 (two) times a day.      cyanocobalamin (,VITAMIN B-12,) 1000 MCG/ML injection Inject 1 mL (1,000 mcg total) into the muscle every 30 (thirty) days. 1 mL 1   cyclobenzaprine (FLEXERIL) 10 MG tablet Take 1 tablet (10 mg total) by mouth 3 (three) times daily as needed for muscle spasms. 30 tablet 0   Docusate Calcium (STOOL SOFTENER PO)      doxycycline (PERIOSTAT) 20 MG tablet Take 20 mg by mouth 2 (two) times daily.     DULoxetine (CYMBALTA) 30 MG capsule Take 90 mg by mouth at bedtime.      EPINEPHrine 0.3 mg/0.3 mL IJ SOAJ injection Inject 0.3 mg into the muscle as needed for anaphylaxis.      fexofenadine (ALLEGRA) 180 MG tablet 1 tablet as needed     fluticasone-salmeterol (ADVAIR HFA) 115-21 MCG/ACT  inhaler Inhale 2 puffs into the lungs 2 (two) times daily.     gabapentin (NEURONTIN) 300 MG capsule Take 300 mg by mouth 3 (three) times daily.     ipratropium (ATROVENT) 0.06 % nasal spray ipratropium bromide 42 mcg (0.06 %) nasal spray     levothyroxine (SYNTHROID) 150 MCG tablet Take 150 mcg by mouth at bedtime.     metoprolol succinate (TOPROL-XL) 25 MG 24 hr tablet TAKE 1 TABLET BY MOUTH ONCE DAILY 90 tablet 3   metroNIDAZOLE (METROGEL) 0.75 % gel Apply 1 application topically 2 (two) times daily.     naproxen sodium (ALEVE) 220 MG tablet Take 220 mg by mouth at bedtime as needed (headache).     nitroGLYCERIN (NITROSTAT) 0.4 MG SL tablet Place 1 tablet (0.4 mg total) under the tongue every 5 (five) minutes as needed for chest pain. 25 tablet 3   omalizumab (XOLAIR) 150 MG injection See admin instructions.     pantoprazole (PROTONIX) 40 MG tablet Take 40 mg by mouth 2 (two) times daily.     Polyethylene Glycol 3350 (MIRALAX PO)      pramipexole (MIRAPEX) 0.125 MG tablet pramipexole 0.125 mg tablet  TAKE ONE TABLET BY MOUTH AT BEDTIME ,INCREASE BY ONE TABLET EVERY 2-3 DAYS TO MAX OF 0.75MG  (SIX TABLETS). 30     pregabalin (LYRICA) 75 MG capsule Take 75 mg by mouth 2 (two) times daily.  Probiotic Product (PROBIOTIC PO) Take 2 capsules by mouth daily.     Syringe, Disposable, (B-D SYRINGE LUER-LOK 3CC) 3 ML MISC BD Luer-Lok Syringe 3 mL 25 gauge x 1"  Subcutaneous Syringes and Needles to Inject Vitamin B12 Monthly.     Adapalene (DIFFERIN EX)      ALPRAZolam (XANAX) 1 MG tablet (Schedule IV Drug)     Azelaic Acid (FINACEA EX) Apply 1 application topically daily.     azelastine (ASTELIN) 0.1 % nasal spray azelastine 137 mcg (0.1 %) nasal spray aerosol  ONE PUFF IN EACH NOSTRIL DAILY.     B-D 3CC LUER-LOK SYR 25GX1" 25G X 1" 3 ML MISC SMARTSIG:Syringe(s) SUB-Q     baclofen (LIORESAL) 10 MG tablet baclofen 10 mg tablet  TAKE ONE TABLET THREE TIMES DAILY AS NEEDED.     buPROPion (ZYBAN) 150  MG 12 hr tablet 1 tablet in the morning     cetirizine (ZYRTEC ALLERGY) 10 MG tablet 1 tablet     diclofenac sodium (VOLTAREN) 1 % GEL Apply 2 g topically 3 (three) times daily as needed (pain).      Ferrous Sulfate Dried (FERROUS SULFATE CR PO)      Hydroxychloroquine Sulfate (PLAQUENIL PO)      Insulin Syringe-Needle U-100 (SAFETY INSULIN SYRINGES) 27G X 1/2" 1 ML MISC by Does not apply route.     LEVOTHYROXINE SODIUM PO      methylPREDNISolone (MEDROL) 4 MG tablet methylprednisolone 4 mg tablets in a dose pack  TAKE AS DIRECTED PER pack instructions     Needles & Syringes MISC Subcutaneous Syringes and Needles to Inject Vitamin B12 Monthly. 3 each 3   predniSONE (DELTASONE) 20 MG tablet 2 po at same time daily for 5 days 10 tablet 0   simvastatin (ZOCOR) 20 MG tablet Take 1 tablet by mouth at bedtime.     sucralfate (CARAFATE) 1 g tablet sucralfate 1 gram tablet  TAKE ONE TABLET ON AN EMPTY STOMACH THREE TIMES DAILY 30 MINUTES BEFORE MEALS.     traZODone (DESYREL) 50 MG tablet Take 50 mg by mouth as needed for sleep. Per patient taking 1/4 to 1/2 tablet as needed     No current facility-administered medications for this visit.    REVIEW OF SYSTEMS:  [X]  denotes positive finding, [ ]  denotes negative finding Cardiac  Comments:  Chest pain or chest pressure:    Shortness of breath upon exertion:    Short of breath when lying flat:    Irregular heart rhythm:        Vascular    Pain in calf, thigh, or hip brought on by ambulation:    Pain in feet at night that wakes you up from your sleep:     Blood clot in your veins:    Leg swelling:         Pulmonary    Oxygen at home:    Productive cough:     Wheezing:         Neurologic    Sudden weakness in arms or legs:     Sudden numbness in arms or legs:     Sudden onset of difficulty speaking or slurred speech:    Temporary loss of vision in one eye:     Problems with dizziness:         Gastrointestinal    Blood in stool:      Vomited blood:         Genitourinary    Burning when urinating:  Blood in urine:        Psychiatric    Major depression:         Hematologic    Bleeding problems:    Problems with blood clotting too easily:        Skin    Rashes or ulcers:        Constitutional    Fever or chills:      PHYSICAL EXAM: Vitals:   11/01/21 1018  BP: 138/86  Pulse: 81  Resp: 16  Temp: (!) 96 F (35.6 C)  TempSrc: Temporal  SpO2: 97%  Weight: 195 lb (88.5 kg)  Height: 5\' 5"  (1.651 m)    GENERAL: The patient is a well-nourished female, in no acute distress. The vital signs are documented above. CARDIAC: There is a regular rate and rhythm.  VASCULAR:  Palpable femoral pulses bilaterally Palpable DP pulses bilaterally PULMONARY: No respiratory distress. ABDOMEN: Soft and non-tender.  Midline incision and left sided colostomy incision.  No obvious hernia. MUSCULOSKELETAL: There are no major deformities or cyanosis. NEUROLOGIC: No focal weakness or paresthesias are detected. SKIN: There are no ulcers or rashes noted. PSYCHIATRIC: The patient has a normal affect.  DATA:   MRI reviewed from 09/30/21     Assessment/Plan:  66 year old female with debilitating back, buttock, and neuropathic left leg pain with degenerative disc disease at L4-L5 that presents for L4-L5 OLIF.  She has had a complex history of abdominal surgery including laparoscopic cholecystectomy, hysterectomy, and most importantly ex lap with sigmoid colectomy and subsequent Hartman's colostomy reversal with incisional hernia repair using retrorectus mesh.  I did lay her on her right side in the office and I think in looking at her abdomen and reviewing her imaging we should be well lateral to her mesh to make an oblique approach over her left oblique muscles and this should be a good option.  I discussed this would be more technically challenging and there would be some risk if the mesh extends more lateral then indicated  in the op note there would be a small chance we would be unable to get the exposure.  I do think it would be reasonable to proceed.  Risk benefits discussed.  Discussed with muscle-sparing technique through the oblique muscles with her in the right lateral position and moving the peritoneum and left ureter and left psoas and possible iliac vessels to expose the L4-L5 disc space from oblique approach.   Marty Heck, MD [facility] Office: [phone]

## 2021-11-03 ENCOUNTER — Encounter: Payer: Medicare Other | Admitting: Internal Medicine

## 2021-11-04 ENCOUNTER — Ambulatory Visit: Payer: Self-pay | Admitting: Orthopedic Surgery

## 2021-11-04 DIAGNOSIS — Z01818 Encounter for other preprocedural examination: Secondary | ICD-10-CM

## 2021-11-08 ENCOUNTER — Ambulatory Visit: Payer: Medicare Other | Admitting: Family Medicine

## 2021-11-11 DIAGNOSIS — L501 Idiopathic urticaria: Secondary | ICD-10-CM | POA: Diagnosis not present

## 2021-11-16 ENCOUNTER — Other Ambulatory Visit: Payer: Self-pay

## 2021-11-25 ENCOUNTER — Telehealth: Admit: 2021-11-25 | Discharge: 2021-11-26 | Payer: MEDICARE | Attending: Dermatology | Primary: Dermatology

## 2021-11-25 DIAGNOSIS — L958 Other vasculitis limited to the skin: Principal | ICD-10-CM

## 2021-11-25 MED ORDER — HYDROXYCHLOROQUINE 200 MG TABLET
ORAL_TABLET | Freq: Two times a day (BID) | ORAL | 3 refills | 30.00000 days | Status: CP
Start: 2021-11-25 — End: ?

## 2021-11-28 ENCOUNTER — Ambulatory Visit: Payer: Self-pay | Admitting: Orthopedic Surgery

## 2021-11-28 DIAGNOSIS — M5416 Radiculopathy, lumbar region: Secondary | ICD-10-CM | POA: Diagnosis not present

## 2021-11-28 NOTE — H&P (Signed)
Subjective:   Patient is a 66 y.o. female presented with a history of Back, buttock and neuropathic left leg pain.  She has failed to improve despite conservative care. She has elected to move forward with surgical intervention. She is scheduled for OLIF L4-L5 with PSFI Patient Active Problem List   Diagnosis Date Noted   Adenoma of liver 10/17/2021   Post-menopause atrophic vaginitis 10/17/2021   Allergic rhinitis 10/17/2021   Allergic rhinitis due to animal (cat) (dog) hair and dander 10/17/2021   Allergy to peanuts 10/17/2021   Atrophic vaginitis 10/17/2021   Epidermoid cyst of skin 10/17/2021   Gastroesophageal reflux disease 10/17/2021   Heart murmur 10/17/2021   Idiopathic urticaria 10/17/2021   Menopausal flushing 10/17/2021   Moderate persistent asthma, uncomplicated 65/46/5035   Unspecified dyspareunia (CODE) 10/17/2021   Vaginal discharge 10/17/2021   Back pain    Fibromyalgia    Osteoarthritis of carpometacarpal (CMC) joint of thumb 05/20/2021   Neck pain 08/04/2020   Cervico-occipital neuralgia 07/02/2020   S/P hernia repair 04/09/2019   S/P colostomy takedown 10/16/2018   Sigmoid volvulus ([place]) 06/07/2018   Bowel perforation ([place]) 06/07/2018   Anemia 06/07/2018   Hypokalemia 06/07/2018   Hypophosphatemia 06/07/2018   AKI (acute kidney injury) ([place])    Leukocytosis (leucocytosis) 06/04/2018   Fecal impaction ([place]) 06/03/2018   Enterocolitis 06/03/2018   Asthma 06/03/2018   Chronic pain syndrome 06/03/2018   Trochanteric bursitis of left hip 01/22/2018   Arthritis of carpometacarpal (CMC) joint of left thumb 12/11/2017   Chronic fatigue syndrome 11/28/2017   Pernicious anemia 11/28/2017   Hashimoto's thyroiditis 11/28/2017   Supraventricular tachycardia ([place]) 11/28/2017   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 Sabra Heck   Complication of anesthesia    Cough    /SOB, PFT's nl, improved with Bronchodilation 8/11   Dysrhythmia    Endometrial cancer ([place])    Esophageal spasm    GERD Dr. Sabra Heck, Dr. Wynetta Emery; egd 2006 nl; UGI 3/13 [facility], moderate GERD, nl motility; seen at [facility] ([place]), egd? neg, sone improvement on sucralfate susp   Fibromyalgia    Foot pain    Dr. Rushie Nyhan   Hashimoto's thyroiditis    High triglycerides    Hypothyroidism    Dr. Lewis Shock 4/14   Leukocytoclastic vasculitis ([place])    Dr. Tonia Brooms   Low HDL (under 40)    Memory loss    Mitral valve prolapse    Paroxysmal atrial tachycardia ([place])    Dr. Marlou Porch   Pernicious anemia    Dr. Sabra Heck   PONV (postoperative nausea and vomiting)    along time ago had nausea ans vomiting after surgery-25 years ago   Reflux    ? delayed gastric emptying--GES nl 01/2012 (6% at 2hrs)   RLS (restless legs syndrome)    low ferritin Dr. Sabra Heck   Rosacea    Sleep apnea    Uterine carcinoma ([place])    Dr. Polly Cobia    Past Surgical History:  Procedure Laterality Date   ABDOMINAL HYSTERECTOMY     bladder tack  x 2   BUNIONECTOMY     CHOLECYSTECTOMY  2009   COLONOSCOPY     scr, 12/2008 ([facility]) nl   COLOSTOMY Left 06/05/2018   Procedure: COLOSTOMY;  Surgeon: Ralene Ok, MD;  Location: [place];  Service: General;  Laterality: Left;   COLOSTOMY REVERSAL N/A 10/16/2018   Procedure: HARTMANN'S COLOSTOMY REVERSAL, RIGID PROCTOSCOPY;  Surgeon: Ralene Ok, MD;  Location: [facility];  Service: General;  Laterality: N/A;   HAMMER TOE SURGERY     HYSTERECTOMY ABDOMINAL WITH SALPINGO-OOPHORECTOMY  2008   [place]  04/09/2019   x2   [place]  N/A 04/09/2019   Procedure: INCISIONAL HERNIA REPAIR WITH MESH;  Surgeon: Ralene Ok, MD;  Location: [place];  Service: General;  Laterality: N/A;   LAPAROSCOPY N/A 10/16/2018   Procedure: LAPAROSCOPY DIAGNOSTIC WITH LYSIS OF ADHESIONS;  Surgeon: Ralene Ok, MD;  Location: [facility];  Service: General;  Laterality: N/A;   LUNG BIOPSY     L Lower lung pulm nodules, largest 4.21mm, low risk, repeat CT in 1 yr now following with pulm at [facility]; 9/13 Stable tiny lung nodules, no f/u needed   LYMPH NODE DISSECTION  2008   LYSIS OF ADHESION N/A 04/09/2019   Procedure: OPEN LYSIS OF ADHESION;  Surgeon: Ralene Ok, MD;  Location: [place];  Service: General;  Laterality: N/A;   METATARSAL OSTEOTOMY     NECK SURGERY     x 2   PARTIAL COLECTOMY N/A 06/05/2018   Procedure: EXPLORATORY LAPAROTOMY AND PARTIAL COLECTOMY;  Surgeon: Ralene Ok, MD;  Location: [place];  Service: General;  Laterality: N/A;   TONSILLECTOMY     US ECHOCARDIOGRAPHY     with Nuclear test, Dr. Marlou Porch, Low risk   VESICOVAGINAL FISTULA CLOSURE W/ TAH      Current Outpatient Medications  Medication Sig Dispense Refill Last Dose   acetaminophen (TYLENOL) 500 MG tablet Take 2 tablets (1,000 mg total) by mouth every 6 (six) hours as needed. 30 tablet 0    albuterol (PROVENTIL HFA;VENTOLIN HFA) 108 (90 Base) MCG/ACT inhaler Inhale 2 puffs into the lungs every 4 (four) hours as needed for wheezing or shortness of breath.      ARIPiprazole (ABILIFY) 2 MG tablet Take 1 tablet (2 mg total) by mouth daily. (Patient taking differently: Take 2 mg by mouth at bedtime.) 90 tablet 1    Ascorbic Acid (VITAMIN C) 1000 MG tablet Take 1,000 mg by mouth in the morning.      aspirin EC 81 MG tablet Take 1 tablet (81 mg total) by mouth daily. (Patient taking differently: Take 81 mg by mouth at bedtime.) 90 tablet 3    atorvastatin (LIPITOR) 10 MG tablet TAKE 1 TABLET BY MOUTH  DAILY (Patient taking differently: Take 10 mg by mouth every  evening.) 90 tablet 3    Azelaic Acid (FINACEA EX) Apply 1 application topically at bedtime.      buPROPion (WELLBUTRIN XL) 150 MG 24 hr tablet Take 450 mg by mouth daily.       cetirizine (ZYRTEC) 10 MG tablet Take 10 mg by mouth in the morning.      Cholecalciferol (VITAMIN D3) 25 MCG (1000 UT) CAPS Take 1,000 Units by mouth 2 (two) times a day.      Coral Calcium 1000 (390 Ca) MG TABS Take 1,000 mg by mouth in the morning.      cyanocobalamin (,VITAMIN B-12,) 1000 MCG/ML injection Inject 1 mL (1,000 mcg total) into the muscle every  30 (thirty) days. 1 mL 1    cyclobenzaprine (FLEXERIL) 10 MG tablet Take 1 tablet (10 mg total) by mouth 3 (three) times daily as needed for muscle spasms. 30 tablet 0    docusate sodium (COLACE) 100 MG capsule Take 100 mg by mouth 2 (two) times daily as needed for mild constipation.      doxycycline (PERIOSTAT) 20 MG tablet Take 20 mg by mouth 2 (two) times daily.      DULoxetine (CYMBALTA) 30 MG capsule Take 90 mg by mouth at bedtime.       EPINEPHrine 0.3 mg/0.3 mL IJ SOAJ injection Inject 0.3 mg into the muscle as needed for anaphylaxis.       fexofenadine (ALLEGRA) 180 MG tablet Take 180 mg by mouth every evening.      fluticasone-salmeterol (ADVAIR HFA) 115-21 MCG/ACT inhaler Inhale 2 puffs into the lungs 2 (two) times daily.      gabapentin (NEURONTIN) 300 MG capsule Take 300-600 mg by mouth See admin instructions. Take 1 capsule (300 mg) by mouth in the morning & take 2 capsules (600 mg) by mouth at night.      ipratropium (ATROVENT) 0.06 % nasal spray Place 2 sprays into both nostrils in the morning and at bedtime.      ketotifen (ZADITOR) 0.025 % ophthalmic solution Place 1 drop into both eyes in the morning.      levothyroxine (SYNTHROID) 150 MCG tablet Take 150 mcg by mouth daily before breakfast.      metoprolol succinate (TOPROL-XL) 25 MG 24 hr tablet TAKE 1 TABLET BY MOUTH ONCE DAILY 90 tablet 3    metroNIDAZOLE (METROGEL) 0.75 % gel Apply 1  application topically in the morning. Applied to face      naproxen sodium (ALEVE) 220 MG tablet Take 220 mg by mouth at bedtime as needed (headache).      Needles & Syringes MISC Subcutaneous Syringes and Needles to Inject Vitamin B12 Monthly. 3 each 3    nitroGLYCERIN (NITROSTAT) 0.4 MG SL tablet Place 1 tablet (0.4 mg total) under the tongue every 5 (five) minutes as needed for chest pain. 25 tablet 3    omalizumab (XOLAIR) 150 MG injection 150 mg every 30 (thirty) days. On the 13th of the month      pantoprazole (PROTONIX) 40 MG tablet Take 80 mg by mouth 2 (two) times daily.      polyethylene glycol (MIRALAX / GLYCOLAX) 17 g packet Take 17 g by mouth daily as needed (constipation.).      pramipexole (MIRAPEX) 0.125 MG tablet Take 0.375 mg by mouth at bedtime as needed (restless legs syndrome).      pregabalin (LYRICA) 75 MG capsule Take 75 mg by mouth 2 (two) times daily.      Probiotic Product (PROBIOTIC PO) Take 1 capsule by mouth in the morning.      Syringe, Disposable, (B-D SYRINGE LUER-LOK 3CC) 3 ML MISC BD Luer-Lok Syringe 3 mL 25 gauge x 1"  Subcutaneous Syringes and Needles to Inject Vitamin B12 Monthly.      No current facility-administered medications for this visit.   Allergies  Allergen Reactions   Aspirin Anaphylaxis    In higher doses   Bee Venom Shortness Of Breath, Itching, Swelling and Hives   Peanut-Containing Drug Products Anaphylaxis   Ambien [Zolpidem Tartrate] Other (See Comments)    Hallucinations   Tape Itching, Swelling and Rash   Ciprofloxacin Nausea And Vomiting   Latex Rash   Minocycline Rash  Shellfish Allergy Rash    Social History   Tobacco Use   Smoking status: Never   Smokeless tobacco: Never  Substance Use Topics   Alcohol use: Yes    Comment: maybe a glass of wine/month    Family History  Problem Relation Age of Onset   Hashimoto's thyroiditis Mother    High blood pressure Mother    Stroke Mother    Other Mother        Borderline  DM   Alzheimer's disease Mother    Ulcerative colitis Father    Meniere's disease Father    Hearing loss Father    Stroke Father    Stroke Sister     Review of Systems Pertinent items are noted in HPI.  Objective:   Vitals: Ht: 5 ft 5 in 11/28/2021 10:23 am BP: 136/78 11/28/2021 10:27 am  General: Alert and oriented 3, no apparent distress  Ambulation: She is using a wheelchair in the office today. She does ambulate around her house  Heart: Regular rate and rhythm, no rubs, murmurs, or gallops  Lungs: Clear to auscultation bilaterally  Abdomen: Bowel sounds 4, nontender, nondistended, no rebound tenderness. No loss of bladder or bowel control.  She continues to have severe debilitating back buttock and neuropathic left leg pain. Despite physical therapy (aquatic therapy) and medications her quality of life is continued to suffer. There has been no significant change in her clinical exam since her last office visit.  Lumbar MRI: completed on 09/30/2021 was completed at [facility]. Multilevel degenerative disc disease most prominent at L4-5. Grade 1 degenerative spondylolisthesis L4-5 producing moderate to severe central stenosis as well as lateral recess stenosis. Mild stenosis at L3-4 and L2-3. No disc herniation or stenosis at L1/2. No fracture or aggressive osseous neoplasm is noted.  Assessment:    [name] returns today for follow-up. Unfortunately her pain and loss of quality of life persist. Based on her failure to improve with conservative management, and the deformity seen at L4-5 I believe this is her primary pain source.  At this point she is elected to move forward with surgical intervention. At this point I think the best option is an OLIF at L4-5 with posterior pedicle screw fixation. She has had previous abdominal surgery and so I do want her evaluated by the vascular surgeon prior to surgery. If it is felt as though the approach would be too difficult given her prior  abdominal surgeries then I would move forward with a TLIF at L4-5.  Plan:   I have gone over the risks benefits and alternatives of the OLIF with the patient and her husband and all of their questions were addressed.  OLIF/XLIF risks, benefits of surgery were reviewed with the patient. These include: infection, bleeding, death, stroke, paralysis, ongoing or worse pain, need for additional surgery, injury to the lumbar plexus resulting in hip flexor weakness and difficulty walking without assistive devices. Adjacent segment degenerative disease, need for additional surgery including fusing other levels, leak of spinal fluid, Nonunion, hardware failure, breakage, or mal-position. Deep venous thrombosis (DVT) requiring additional treatment such as filter, and/or medications. Injury to abdominal contents, loss in bowel and bladder control.  Risks and benefits of spinal fusion: Infection, bleeding, death, stroke, paralysis, ongoing or worse pain, need for additional surgery, nonunion, leak of spinal fluid, adjacent segment degeneration requiring additional fusion surgery, Injury to abdominal vessels that can require anterior surgery to stop bleeding. Malposition of the cage and/or pedicle screws that could require additional surgery.  Loss of bowel and bladder control. Postoperative hematoma causing neurologic compression that could require urgent or emergent re-operation.  We have received preoperative medical clearance from the patient's primary care provider. There is a note in the patient's chart from her cardiologist noting that she is not at any increased cardiac risk. She is also seeing the vascular surgeon.  I have reviewed the patient's medication list with her. She will stop her aspirin 7 days prior to surgery and will plan to resume this 24-48 hours after surgery. I also recommended she stop any NSAIDs 1 week prior and she expressed understanding of this. She will also hold vitamin D supplements one  week prior to surgery. 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 She will continue this cleaning up to surgery prior to her being weaned off of it by her rheumatologist. I did discuss with the patient that this medication can slightly increase her risk of postoperative infection and she expressed understanding of this. She will contact me if this flare requires any additional medications that would alter her immune system such as steroids.  Patient was given an LSO brace at today's visit.  We have also discussed the post-operative recovery period to include: bathing/showering restrictions, wound healing, activity (and driving) restrictions, medications/pain mangement.  We have also discussed post-operative redflags to include: signs and symptoms of postoperative infection, DVT/PE.  Discharge instructions reviewed with the patient. She was given a copy today.  Follow-up: 2 weeks postop

## 2021-11-28 NOTE — H&P (Deleted)
  The note originally documented on this encounter has been moved the the encounter in which it belongs.  

## 2021-11-29 MED ORDER — PREDNISONE 10 MG TABLET
ORAL_TABLET | ORAL | 0 refills | 20.00000 days | Status: CP
Start: 2021-11-29 — End: 2021-12-19

## 2021-11-30 NOTE — Pre-Procedure Instructions (Signed)
Surgical Instructions    Your procedure is scheduled on Tuesday, February 8th.  Report to [facility] Main Entrance "A" at 6:30 A.M., then check in with the Admitting office.  Call this number if you have problems the morning of surgery:  [phone]   If you have any questions prior to your surgery date call [phone]: Open Monday-Friday 8am-4pm    Remember:  Do not eat after midnight the night before your surgery  You may drink clear liquids until 5:30 a.m. the morning of your surgery.   Clear liquids allowed are: Water, Non-Citrus Juices (without pulp), Carbonated Beverages, Clear Tea, Black Coffee Only (NO MILK, CREAM OR POWDERED CREAMER of any kind), and Gatorade.    Take these medicines the morning of surgery with A SIP OF WATER  buPROPion (WELLBUTRIN XL)  fexofenadine (ALLEGRA) doxycycline (PERIOSTAT) fluticasone-salmeterol (ADVAIR HFA) hydroxychloroquine (PLAQUENIL)  ketotifen (ZADITOR)  levothyroxine (SYNTHROID) metoprolol succinate (TOPROL-XL)  pantoprazole (PROTONIX)  pregabalin (LYRICA)   Take these medications as needed acetaminophen (TYLENOL)  Albuterol inhaler-please bring inhaler with you to the hospital. cyclobenzaprine (FLEXERIL)  EPINEPHrine-please let a nurse know if you had to use this. nitroGLYCERIN (NITROSTAT)-please let a nurse know if you had to use this.  Follow your surgeon's instructions on when to stop Aspirin.  If no instructions were given by your surgeon then you will need to call the office to get those instructions.    As of today, STOP taking any Aleve, Naproxen, Ibuprofen, Motrin, Advil, Goody's, BC's, all herbal medications, fish oil, and all vitamins.                     Do NOT Smoke (Tobacco/Vaping) for 24 hours prior to your procedure.  If you use a CPAP at night, you may bring your mask/headgear for your overnight stay.   Contacts, glasses, piercing's, hearing aid's, dentures or partials may not be worn into surgery, please  bring cases for these belongings.    For patients admitted to the hospital, discharge time will be determined by your treatment team.   Patients discharged the day of surgery will not be allowed to drive home, and someone needs to stay with them for 24 hours.  NO VISITORS WILL BE ALLOWED IN PRE-OP WHERE PATIENTS ARE PREPPED FOR SURGERY.  ONLY 1 SUPPORT PERSON MAY BE PRESENT IN THE WAITING ROOM WHILE YOU ARE IN SURGERY.  IF YOU ARE TO BE ADMITTED, ONCE YOU ARE IN YOUR ROOM YOU WILL BE ALLOWED TWO (2) VISITORS. (1) VISITOR MAY STAY OVERNIGHT BUT MUST ARRIVE TO THE ROOM BY 8pm.  Minor children may have two parents present. Special consideration for safety and communication needs will be reviewed on a case by case basis.   Special instructions:   [place]- Preparing For Surgery  Before surgery, you can play an important role. Because skin is not sterile, your skin needs to be as free of germs as possible. You can reduce the number of germs on your skin by washing with CHG (chlorahexidine gluconate) Soap before surgery.  CHG is an antiseptic cleaner which kills germs and bonds with the skin to continue killing germs even after washing.    Oral Hygiene is also important to reduce your risk of infection.  Remember - BRUSH YOUR TEETH THE MORNING OF SURGERY WITH YOUR REGULAR TOOTHPASTE  Please do not use if you have an allergy to CHG or antibacterial soaps. If your skin becomes reddened/irritated stop using the CHG.  Do not shave (including legs  and underarms) for at least 48 hours prior to first CHG shower. It is OK to shave your face.  Please follow these instructions carefully.   Shower the NIGHT BEFORE SURGERY and the MORNING OF SURGERY  If you chose to wash your hair, wash your hair first as usual with your normal shampoo.  After you shampoo, rinse your hair and body thoroughly to remove the shampoo.  Use CHG Soap as you would any other liquid soap. You can apply CHG directly to the skin  and wash gently with a scrungie or a clean washcloth.   Apply the CHG Soap to your body ONLY FROM THE NECK DOWN.  Do not use on open wounds or open sores. Avoid contact with your eyes, ears, mouth and genitals (private parts). Wash Face and genitals (private parts)  with your normal soap.   Wash thoroughly, paying special attention to the area where your surgery will be performed.  Thoroughly rinse your body with warm water from the neck down.  DO NOT shower/wash with your normal soap after using and rinsing off the CHG Soap.  Pat yourself dry with a CLEAN TOWEL.  Wear CLEAN PAJAMAS to bed the night before surgery  Place CLEAN SHEETS on your bed the night before your surgery  DO NOT SLEEP WITH PETS.   Day of Surgery: Shower with CHG soap. Do not wear jewelry, make up, nail polish, gel polish, artificial nails, or any other type of covering on natural nails including finger and toenails. If patients have artificial nails, gel coating, etc. that need to be removed by a nail salon please have this removed prior to surgery. Surgery may need to be canceled/delayed if the surgeon/anesthesiologist feels like the patient is unable to be adequately monitored. Do not wear lotions, powders, perfumes, or deodorant. Do not shave 48 hours prior to surgery.  Do not bring valuables to the hospital. [facility] is not responsible for any belongings or valuables. Wear Clean/Comfortable clothing the morning of surgery Remember to brush your teeth WITH YOUR REGULAR TOOTHPASTE.   Please read over the following fact sheets that you were given.   3 days prior to your procedure or After your COVID test   You are not required to quarantine however you are required to wear a well-fitting mask when you are out and around people not in your household. If your mask becomes wet or soiled, replace with a new one.   Wash your hands often with soap and water for 20 seconds or clean your hands with an  alcohol-based hand sanitizer that contains at least 60% alcohol.   Do not share personal items.   Notify your provider:  o if you are in close contact with someone who has COVID  o or if you develop a fever of 100.4 or greater, sneezing, cough, sore throat, shortness of breath or body aches.

## 2021-12-01 ENCOUNTER — Encounter (HOSPITAL_COMMUNITY)
Admission: RE | Admit: 2021-12-01 | Discharge: 2021-12-01 | Disposition: A | Discharge status: Home / Self Care | Payer: Medicare Other | Source: Ambulatory Visit | Attending: Orthopedic Surgery | Admitting: Orthopedic Surgery

## 2021-12-01 ENCOUNTER — Encounter (HOSPITAL_COMMUNITY): Payer: Self-pay

## 2021-12-01 ENCOUNTER — Other Ambulatory Visit: Payer: Self-pay

## 2021-12-01 DIAGNOSIS — Z01818 Encounter for other preprocedural examination: Secondary | ICD-10-CM | POA: Insufficient documentation

## 2021-12-01 HISTORY — DX: Patent foramen ovale: Q21.12

## 2021-12-01 LAB — URINALYSIS, ROUTINE W REFLEX MICROSCOPIC
Glucose, UA: NEGATIVE mg/dL
Ketones, ur: NEGATIVE mg/dL
Nitrite: NEGATIVE
Protein, ur: NEGATIVE mg/dL
Specific Gravity, Urine: >1.03 — ABNORMAL HIGH (ref 1.005–1.030)
pH: 5.5 (ref 5.0–8.0)

## 2021-12-01 LAB — SURGICAL PCR SCREEN
MRSA, PCR: NEGATIVE
Staphylococcus aureus: NEGATIVE

## 2021-12-01 LAB — BASIC METABOLIC PANEL
Anion gap: 7 (ref 5–15)
BUN: 13 mg/dL (ref 8–23)
CO2: 30 mmol/L (ref 22–32)
Calcium: 9.4 mg/dL (ref 8.9–10.3)
Chloride: 104 mmol/L (ref 98–111)
Creatinine, Ser: 1.17 mg/dL — ABNORMAL HIGH (ref 0.44–1.00)
GFR, Estimated: 51 mL/min — ABNORMAL LOW (ref >60)
Glucose, Bld: 105 mg/dL — ABNORMAL HIGH (ref 70–99)
Potassium: 4.2 mmol/L (ref 3.5–5.1)
Sodium: 141 mmol/L (ref 135–145)

## 2021-12-01 LAB — CBC
HCT: 42.9 % (ref 36.0–46.0)
Hemoglobin: 13.9 g/dL (ref 12.0–15.0)
MCH: 30.8 pg (ref 26.0–34.0)
MCHC: 32.4 g/dL (ref 30.0–36.0)
MCV: 94.9 fL (ref 80.0–100.0)
Platelets: 434 10*3/uL — ABNORMAL HIGH (ref 150–400)
RBC: 4.52 MIL/uL (ref 3.87–5.11)
RDW: 12.5 % (ref 11.5–15.5)
WBC: 9.4 10*3/uL (ref 4.0–10.5)
nRBC: 0 % (ref 0.0–0.2)

## 2021-12-01 LAB — URINALYSIS, MICROSCOPIC (REFLEX): WBC, UA: >50 WBC/hpf (ref 0–5)

## 2021-12-01 NOTE — Progress Notes (Signed)
PCP - Dr. Alain Honey Cardiologist - Dr. Candee Furbish  PPM/ICD - n/a Device Orders - n/a Rep Notified - n/a  Chest x-ray - n/a EKG - 12/01/21 Stress Test - Per patient over 10 years ago at Dr. Marlou Porch office- normal per patient ECHO - per patient over 10 years ago at Dr. Marlou Porch office Cardiac Cath - denies  Sleep Study - Yes. Media tab. CPAP - Wears nightly. Does not know settings.  Fasting Blood Sugar - n/a Checks Blood Sugar _____ times a day- n/a  Blood Thinner Instructions: n/a Aspirin Instructions: n/a  ERAS Protcol - Yes PRE-SURGERY Ensure or G2- None ordered.   COVID TEST- Scheduled for 12/05/21 at 12:15. Patient is aware of date, time, and location.    Anesthesia review: Yes. Cardiac history.   Patient denies shortness of breath, fever, cough and chest pain at PAT appointment   All instructions explained to the patient, with a verbal understanding of the material. Patient agrees to go over the instructions while at home for a better understanding. Patient also instructed to self quarantine after being tested for COVID-19. The opportunity to ask questions was provided.

## 2021-12-02 LAB — TYPE AND SCREEN
ABO/RH(D): B POS
Antibody Screen: NEGATIVE

## 2021-12-02 NOTE — Progress Notes (Signed)
Anesthesia Chart Review:  Previously followed with cardiologist Dr. Marlou Porch for paroxysmal atrial tachycardia, now well controlled on metoprolol.  In 2011 she had echocardiogram and nuclear stress which, per Dr. Marlou Porch notes, were reassuring showing normal EF, no ischemia, mild diastolic function.  Patient seen by her PCP Dr. Alain Honey 10/04/2021 for preop evaluation.  Patient doing well at that time, no acute concerns noted, cleared for surgery.  Preop labs reviewed, unremarkable.  EKG 12/01/2021: NSR.  Rate 74.  Carotid US 03/26/14: Summary: Bilateral: mild soft plaque origin ICA. 1-39% ICA stenosis. Vertebral artery flow is antegrade. ICA/CCA ratio: R-0.80 L-0.66.   Echo 07/11/10 [facility] now [facility]; scanned under Media tab, Correspondence 03/01/11): Conclusions: 1.  Normal LV size and function. 2.  There were no gross regional wall motion abnormalities. 3.  Left ventricular ejection fraction estimated by 2D at 55 to 60%. 4.  Mildly thickened mitral valve. No mitral valve regurgitation. 5.  Trivial tricuspid regurgitation.  Normal estimated right ventricular systolic pressure. 6.  Analysis of mitral valve inflow, pulmonary vein Doppler and tissue Doppler suggests grade 1 diastolic dysfunction without elevated left atrial pressure.   According to 03/04/14 note by Dr. Marlou Porch, she "underwent echocardiogram and nuclear stress test on 07/11/10 both reassuring showing normal EF, no ischemia, mild diastolic dysfunction."      Wynonia Musty [facility] Short Stay Center/Anesthesiology Phone [phone] 12/02/2021 4:18 PM

## 2021-12-02 NOTE — Anesthesia Preprocedure Evaluation (Addendum)
Anesthesia Evaluation  Patient identified by MRN, date of birth, ID band Patient awake    Reviewed: Allergy & Precautions, H&P , NPO status , Patient's Chart, lab work & pertinent test results  History of Anesthesia Complications (+) PONV and history of anesthetic complications  Airway Mallampati: II   Neck ROM: full    Dental   Pulmonary sleep apnea ,    breath sounds clear to auscultation       Cardiovascular + dysrhythmias  Rhythm:regular Rate:Normal     Neuro/Psych  Neuromuscular disease    GI/Hepatic GERD  ,  Endo/Other  Hypothyroidism   Renal/GU      Musculoskeletal  (+) Arthritis , Fibromyalgia -  Abdominal   Peds  Hematology   Anesthesia Other Findings   Reproductive/Obstetrics                             Anesthesia Physical Anesthesia Plan  ASA: 2  Anesthesia Plan: General   Post-op Pain Management: Regional block   Induction: Intravenous  PONV Risk Score and Plan: 4 or greater and Ondansetron, Dexamethasone, Midazolam and Treatment may vary due to age or medical condition  Airway Management Planned: Oral ETT  Additional Equipment:   Intra-op Plan:   Post-operative Plan: Extubation in OR  Informed Consent: I have reviewed the patients History and Physical, chart, labs and discussed the procedure including the risks, benefits and alternatives for the proposed anesthesia with the patient or authorized representative who has indicated his/her understanding and acceptance.     Dental advisory given  Plan Discussed with: CRNA, Anesthesiologist and Surgeon  Anesthesia Plan Comments: (PAT note by Karoline Caldwell, PA-C: Previously followed with cardiologist Dr. Marlou Porch for paroxysmal atrial tachycardia, now well controlled on metoprolol.  In 2011 she had echocardiogram and nuclear stress which, per Dr. Marlou Porch notes, were reassuring showing normal EF, no ischemia, mild  diastolic function.  Patient seen by her PCP Dr. Alain Honey 10/04/2021 for preop evaluation.  Patient doing well at that time, no acute concerns noted, cleared for surgery.  Preop labs reviewed, unremarkable.  EKG 12/01/2021: NSR.  Rate 74.  Carotid US 03/26/14: Summary: Bilateral: mild soft plaque origin ICA. 1-39% ICA stenosis. Vertebral artery flow is antegrade. ICA/CCA ratio: R-0.80 L-0.66.  Echo 07/11/10 [facility] now [facility]; scanned under Media tab, Correspondence 03/01/11): Conclusions: 1. Normal LV size and function. 2. There were no gross regional wall motion abnormalities. 3. Left ventricular ejection fraction estimated by 2D at 55 to 60%. 4. Mildly thickened mitral valve. Nomitral valve regurgitation. 5. Trivial tricuspid regurgitation. Normal estimated right ventricular systolic pressure. 6. Analysis of mitral valve inflow, pulmonary vein Doppler and tissue Doppler suggests grade 1 diastolic dysfunction without elevated left atrial pressure.  According to 03/04/14 note by Dr. Marlou Porch, she "underwent echocardiogram and nuclear stress test on 07/11/10 both reassuring showing normal EF, no ischemia, mild diastolic dysfunction."  )       Anesthesia Quick Evaluation

## 2021-12-05 ENCOUNTER — Other Ambulatory Visit (HOSPITAL_COMMUNITY)
Admission: RE | Admit: 2021-12-05 | Discharge: 2021-12-05 | Disposition: A | Discharge status: Home / Self Care | Payer: Medicare Other | Source: Ambulatory Visit | Attending: Orthopedic Surgery | Admitting: Orthopedic Surgery

## 2021-12-05 DIAGNOSIS — M4326 Fusion of spine, lumbar region: Secondary | ICD-10-CM | POA: Diagnosis not present

## 2021-12-05 DIAGNOSIS — M5416 Radiculopathy, lumbar region: Secondary | ICD-10-CM | POA: Diagnosis not present

## 2021-12-05 DIAGNOSIS — M797 Fibromyalgia: Secondary | ICD-10-CM | POA: Diagnosis present

## 2021-12-05 DIAGNOSIS — Z0389 Encounter for observation for other suspected diseases and conditions ruled out: Secondary | ICD-10-CM | POA: Diagnosis not present

## 2021-12-05 DIAGNOSIS — M549 Dorsalgia, unspecified: Secondary | ICD-10-CM | POA: Diagnosis present

## 2021-12-05 DIAGNOSIS — G9332 Myalgic encephalomyelitis/chronic fatigue syndrome: Secondary | ICD-10-CM | POA: Diagnosis present

## 2021-12-05 DIAGNOSIS — M310 Hypersensitivity angiitis: Secondary | ICD-10-CM | POA: Diagnosis not present

## 2021-12-05 DIAGNOSIS — K219 Gastro-esophageal reflux disease without esophagitis: Secondary | ICD-10-CM | POA: Diagnosis present

## 2021-12-05 DIAGNOSIS — Z8542 Personal history of malignant neoplasm of other parts of uterus: Secondary | ICD-10-CM | POA: Diagnosis not present

## 2021-12-05 DIAGNOSIS — I471 Supraventricular tachycardia: Secondary | ICD-10-CM | POA: Diagnosis not present

## 2021-12-05 DIAGNOSIS — E063 Autoimmune thyroiditis: Secondary | ICD-10-CM | POA: Diagnosis not present

## 2021-12-05 DIAGNOSIS — J4540 Moderate persistent asthma, uncomplicated: Secondary | ICD-10-CM | POA: Diagnosis not present

## 2021-12-05 DIAGNOSIS — Z90710 Acquired absence of both cervix and uterus: Secondary | ICD-10-CM | POA: Diagnosis not present

## 2021-12-05 DIAGNOSIS — E781 Pure hyperglyceridemia: Secondary | ICD-10-CM | POA: Diagnosis present

## 2021-12-05 DIAGNOSIS — Z9049 Acquired absence of other specified parts of digestive tract: Secondary | ICD-10-CM | POA: Diagnosis not present

## 2021-12-05 DIAGNOSIS — N3010 Interstitial cystitis (chronic) without hematuria: Secondary | ICD-10-CM | POA: Diagnosis not present

## 2021-12-05 DIAGNOSIS — Z9109 Other allergy status, other than to drugs and biological substances: Secondary | ICD-10-CM | POA: Diagnosis not present

## 2021-12-05 DIAGNOSIS — M48061 Spinal stenosis, lumbar region without neurogenic claudication: Secondary | ICD-10-CM | POA: Diagnosis not present

## 2021-12-05 DIAGNOSIS — Q2112 Patent foramen ovale: Secondary | ICD-10-CM | POA: Diagnosis not present

## 2021-12-05 DIAGNOSIS — Z91010 Allergy to peanuts: Secondary | ICD-10-CM | POA: Diagnosis not present

## 2021-12-05 DIAGNOSIS — L719 Rosacea, unspecified: Secondary | ICD-10-CM | POA: Diagnosis present

## 2021-12-05 DIAGNOSIS — Z981 Arthrodesis status: Secondary | ICD-10-CM | POA: Diagnosis not present

## 2021-12-05 DIAGNOSIS — I341 Nonrheumatic mitral (valve) prolapse: Secondary | ICD-10-CM | POA: Diagnosis not present

## 2021-12-05 DIAGNOSIS — M5116 Intervertebral disc disorders with radiculopathy, lumbar region: Secondary | ICD-10-CM | POA: Diagnosis not present

## 2021-12-05 DIAGNOSIS — G894 Chronic pain syndrome: Secondary | ICD-10-CM | POA: Diagnosis present

## 2021-12-05 DIAGNOSIS — M4316 Spondylolisthesis, lumbar region: Secondary | ICD-10-CM | POA: Diagnosis not present

## 2021-12-05 DIAGNOSIS — G2581 Restless legs syndrome: Secondary | ICD-10-CM | POA: Diagnosis not present

## 2021-12-05 DIAGNOSIS — G4730 Sleep apnea, unspecified: Secondary | ICD-10-CM | POA: Diagnosis present

## 2021-12-05 DIAGNOSIS — Z20822 Contact with and (suspected) exposure to covid-19: Secondary | ICD-10-CM | POA: Diagnosis not present

## 2021-12-05 DIAGNOSIS — M5136 Other intervertebral disc degeneration, lumbar region: Secondary | ICD-10-CM | POA: Diagnosis not present

## 2021-12-05 DIAGNOSIS — G8918 Other acute postprocedural pain: Secondary | ICD-10-CM | POA: Diagnosis not present

## 2021-12-05 LAB — SARS CORONAVIRUS 2 (TAT 6-24 HRS): SARS Coronavirus 2: NEGATIVE

## 2021-12-07 ENCOUNTER — Encounter (HOSPITAL_COMMUNITY): Payer: Self-pay | Admitting: Orthopedic Surgery

## 2021-12-07 ENCOUNTER — Encounter (HOSPITAL_COMMUNITY)
Admission: RE | Disposition: A | Discharge status: Home / Self Care | Payer: Self-pay | Source: Home / Self Care | Attending: Orthopedic Surgery

## 2021-12-07 ENCOUNTER — Inpatient Hospital Stay (HOSPITAL_COMMUNITY)
Admission: RE | Admit: 2021-12-07 | Discharge: 2021-12-08 | DRG: 460 | Disposition: A | Discharge status: Home / Self Care | Payer: Medicare Other | Attending: Orthopedic Surgery | Admitting: Orthopedic Surgery

## 2021-12-07 ENCOUNTER — Other Ambulatory Visit: Payer: Self-pay

## 2021-12-07 ENCOUNTER — Inpatient Hospital Stay (HOSPITAL_COMMUNITY): Payer: Medicare Other | Admitting: Physician Assistant

## 2021-12-07 ENCOUNTER — Inpatient Hospital Stay (HOSPITAL_COMMUNITY): Payer: Medicare Other | Admitting: Anesthesiology

## 2021-12-07 ENCOUNTER — Inpatient Hospital Stay (HOSPITAL_COMMUNITY): Payer: Medicare Other

## 2021-12-07 DIAGNOSIS — L719 Rosacea, unspecified: Secondary | ICD-10-CM | POA: Diagnosis present

## 2021-12-07 DIAGNOSIS — M797 Fibromyalgia: Secondary | ICD-10-CM | POA: Diagnosis present

## 2021-12-07 DIAGNOSIS — Z91010 Allergy to peanuts: Secondary | ICD-10-CM | POA: Diagnosis not present

## 2021-12-07 DIAGNOSIS — Z20822 Contact with and (suspected) exposure to covid-19: Secondary | ICD-10-CM | POA: Diagnosis present

## 2021-12-07 DIAGNOSIS — K219 Gastro-esophageal reflux disease without esophagitis: Secondary | ICD-10-CM | POA: Diagnosis present

## 2021-12-07 DIAGNOSIS — R011 Cardiac murmur, unspecified: Secondary | ICD-10-CM | POA: Diagnosis present

## 2021-12-07 DIAGNOSIS — M5116 Intervertebral disc disorders with radiculopathy, lumbar region: Secondary | ICD-10-CM | POA: Diagnosis present

## 2021-12-07 DIAGNOSIS — G4730 Sleep apnea, unspecified: Secondary | ICD-10-CM | POA: Diagnosis present

## 2021-12-07 DIAGNOSIS — G894 Chronic pain syndrome: Secondary | ICD-10-CM | POA: Diagnosis present

## 2021-12-07 DIAGNOSIS — I341 Nonrheumatic mitral (valve) prolapse: Secondary | ICD-10-CM | POA: Diagnosis present

## 2021-12-07 DIAGNOSIS — Z9049 Acquired absence of other specified parts of digestive tract: Secondary | ICD-10-CM | POA: Diagnosis not present

## 2021-12-07 DIAGNOSIS — Z91030 Bee allergy status: Secondary | ICD-10-CM

## 2021-12-07 DIAGNOSIS — Z91013 Allergy to seafood: Secondary | ICD-10-CM

## 2021-12-07 DIAGNOSIS — Z8542 Personal history of malignant neoplasm of other parts of uterus: Secondary | ICD-10-CM | POA: Diagnosis not present

## 2021-12-07 DIAGNOSIS — Z90710 Acquired absence of both cervix and uterus: Secondary | ICD-10-CM

## 2021-12-07 DIAGNOSIS — Z886 Allergy status to analgesic agent status: Secondary | ICD-10-CM

## 2021-12-07 DIAGNOSIS — Q2112 Patent foramen ovale: Secondary | ICD-10-CM

## 2021-12-07 DIAGNOSIS — Z419 Encounter for procedure for purposes other than remedying health state, unspecified: Secondary | ICD-10-CM

## 2021-12-07 DIAGNOSIS — Z7982 Long term (current) use of aspirin: Secondary | ICD-10-CM

## 2021-12-07 DIAGNOSIS — I471 Supraventricular tachycardia: Secondary | ICD-10-CM | POA: Diagnosis present

## 2021-12-07 DIAGNOSIS — E781 Pure hyperglyceridemia: Secondary | ICD-10-CM | POA: Diagnosis present

## 2021-12-07 DIAGNOSIS — M4326 Fusion of spine, lumbar region: Secondary | ICD-10-CM | POA: Diagnosis not present

## 2021-12-07 DIAGNOSIS — Z981 Arthrodesis status: Secondary | ICD-10-CM

## 2021-12-07 DIAGNOSIS — J4540 Moderate persistent asthma, uncomplicated: Secondary | ICD-10-CM | POA: Diagnosis present

## 2021-12-07 DIAGNOSIS — Z7951 Long term (current) use of inhaled steroids: Secondary | ICD-10-CM

## 2021-12-07 DIAGNOSIS — E063 Autoimmune thyroiditis: Secondary | ICD-10-CM | POA: Diagnosis present

## 2021-12-07 DIAGNOSIS — M48061 Spinal stenosis, lumbar region without neurogenic claudication: Principal | ICD-10-CM | POA: Diagnosis present

## 2021-12-07 DIAGNOSIS — M310 Hypersensitivity angiitis: Secondary | ICD-10-CM | POA: Diagnosis present

## 2021-12-07 DIAGNOSIS — G9332 Myalgic encephalomyelitis/chronic fatigue syndrome: Secondary | ICD-10-CM | POA: Diagnosis present

## 2021-12-07 DIAGNOSIS — Z888 Allergy status to other drugs, medicaments and biological substances status: Secondary | ICD-10-CM

## 2021-12-07 DIAGNOSIS — Z9109 Other allergy status, other than to drugs and biological substances: Secondary | ICD-10-CM

## 2021-12-07 DIAGNOSIS — Z0389 Encounter for observation for other suspected diseases and conditions ruled out: Secondary | ICD-10-CM | POA: Diagnosis not present

## 2021-12-07 DIAGNOSIS — N3010 Interstitial cystitis (chronic) without hematuria: Secondary | ICD-10-CM | POA: Diagnosis present

## 2021-12-07 DIAGNOSIS — M4316 Spondylolisthesis, lumbar region: Secondary | ICD-10-CM | POA: Diagnosis present

## 2021-12-07 DIAGNOSIS — G8918 Other acute postprocedural pain: Secondary | ICD-10-CM | POA: Diagnosis not present

## 2021-12-07 DIAGNOSIS — G2581 Restless legs syndrome: Secondary | ICD-10-CM | POA: Diagnosis present

## 2021-12-07 DIAGNOSIS — Z91040 Latex allergy status: Secondary | ICD-10-CM

## 2021-12-07 DIAGNOSIS — Z79899 Other long term (current) drug therapy: Secondary | ICD-10-CM

## 2021-12-07 DIAGNOSIS — M5136 Other intervertebral disc degeneration, lumbar region: Secondary | ICD-10-CM | POA: Diagnosis not present

## 2021-12-07 DIAGNOSIS — Z79890 Hormone replacement therapy (postmenopausal): Secondary | ICD-10-CM

## 2021-12-07 DIAGNOSIS — M5416 Radiculopathy, lumbar region: Secondary | ICD-10-CM | POA: Diagnosis not present

## 2021-12-07 DIAGNOSIS — Z881 Allergy status to other antibiotic agents status: Secondary | ICD-10-CM

## 2021-12-07 DIAGNOSIS — M549 Dorsalgia, unspecified: Secondary | ICD-10-CM | POA: Diagnosis present

## 2021-12-07 DIAGNOSIS — Z01818 Encounter for other preprocedural examination: Secondary | ICD-10-CM

## 2021-12-07 HISTORY — PX: OBLIQUE LUMBAR INTERBODY FUSION 1 LEVEL WITH PERCUTANEOUS SCREWS: SHX6890

## 2021-12-07 HISTORY — PX: ABDOMINAL EXPOSURE: SHX5708

## 2021-12-07 LAB — ABO/RH: ABO/RH(D): B POS

## 2021-12-07 SURGERY — OBLIQUE LUMBAR INTERBODY FUSION 1 LEVEL WITH PERCUTANEOUS SCREWS
Anesthesia: General

## 2021-12-07 MED ORDER — BUPIVACAINE LIPOSOME 1.3 % IJ SUSP
INTRAMUSCULAR | Status: DC | PRN
  Administered 2021-12-07: 10 mL via PERINEURAL

## 2021-12-07 MED ORDER — ALBUTEROL SULFATE (2.5 MG/3ML) 0.083% IN NEBU: 2.5000 mg | INHALATION_SOLUTION | RESPIRATORY_TRACT | Status: DC | PRN

## 2021-12-07 MED ORDER — CEFAZOLIN SODIUM-DEXTROSE 2-4 GM/100ML-% IV SOLN
2.0000 g | INTRAVENOUS | Status: AC
  Administered 2021-12-07: 2 g via INTRAVENOUS
  Filled 2021-12-07: qty 100

## 2021-12-07 MED ORDER — MIDAZOLAM HCL 2 MG/2ML IJ SOLN
INTRAMUSCULAR | Status: DC | PRN
Start: 2021-12-07 — End: 2021-12-07
  Administered 2021-12-07: 2 mg via INTRAVENOUS

## 2021-12-07 MED ORDER — CHLORHEXIDINE GLUCONATE 0.12 % MT SOLN
15.0000 mL | Freq: Once | OROMUCOSAL | Status: AC
  Administered 2021-12-07: 15 mL via OROMUCOSAL
  Filled 2021-12-07: qty 15

## 2021-12-07 MED ORDER — EPINEPHRINE 0.3 MG/0.3ML IJ SOAJ
0.3000 mg | INTRAMUSCULAR | Status: DC | PRN
  Filled 2021-12-07: qty 0.6

## 2021-12-07 MED ORDER — OXYCODONE HCL 5 MG PO TABS
5.0000 mg | ORAL_TABLET | Freq: Once | ORAL | Status: AC | PRN
  Administered 2021-12-07: 5 mg via ORAL

## 2021-12-07 MED ORDER — CHLORHEXIDINE GLUCONATE CLOTH 2 % EX PADS: 6.0000 | MEDICATED_PAD | Freq: Once | CUTANEOUS | Status: DC

## 2021-12-07 MED ORDER — PROPOFOL 10 MG/ML IV BOLUS
INTRAVENOUS | Status: DC | PRN
  Administered 2021-12-07: 110 mg via INTRAVENOUS

## 2021-12-07 MED ORDER — LACTATED RINGERS IV SOLN: INTRAVENOUS | Status: DC

## 2021-12-07 MED ORDER — ONDANSETRON HCL 4 MG PO TABS: 4.0000 mg | ORAL_TABLET | Freq: Three times a day (TID) | ORAL | 0 refills | Status: DC | PRN

## 2021-12-07 MED ORDER — METHOCARBAMOL 500 MG PO TABS: 500.0000 mg | ORAL_TABLET | Freq: Three times a day (TID) | ORAL | 0 refills | Status: AC | PRN

## 2021-12-07 MED ORDER — SODIUM CHLORIDE 0.9 % IV SOLN: 250.0000 mL | INTRAVENOUS | Status: DC

## 2021-12-07 MED ORDER — ONDANSETRON HCL 4 MG/2ML IJ SOLN: 4.0000 mg | Freq: Four times a day (QID) | INTRAMUSCULAR | Status: DC | PRN

## 2021-12-07 MED ORDER — DULOXETINE HCL 30 MG PO CPEP
90.0000 mg | ORAL_CAPSULE | Freq: Every day | ORAL | Status: DC
Start: 2021-12-07 — End: 2021-12-08
  Administered 2021-12-07: 90 mg via ORAL
  Filled 2021-12-07: qty 3

## 2021-12-07 MED ORDER — OXYCODONE HCL 5 MG PO TABS
5.0000 mg | ORAL_TABLET | ORAL | Status: DC | PRN
  Filled 2021-12-07 (×2): qty 1

## 2021-12-07 MED ORDER — ACETAMINOPHEN 650 MG RE SUPP: 650.0000 mg | RECTAL | Status: DC | PRN

## 2021-12-07 MED ORDER — FENTANYL CITRATE (PF) 250 MCG/5ML IJ SOLN
INTRAMUSCULAR | Status: AC
  Filled 2021-12-07: qty 5

## 2021-12-07 MED ORDER — OXYCODONE-ACETAMINOPHEN 10-325 MG PO TABS: 1.0000 | ORAL_TABLET | Freq: Four times a day (QID) | ORAL | 0 refills | Status: AC | PRN

## 2021-12-07 MED ORDER — SUGAMMADEX SODIUM 200 MG/2ML IV SOLN
INTRAVENOUS | Status: DC | PRN
  Administered 2021-12-07: 200 mg via INTRAVENOUS

## 2021-12-07 MED ORDER — SORBITOL 70 % SOLN
30.0000 mL | Freq: Every day | Status: DC | PRN
  Administered 2021-12-07: 30 mL via ORAL
  Filled 2021-12-07: qty 30

## 2021-12-07 MED ORDER — SODIUM CHLORIDE 0.9% FLUSH: 3.0000 mL | Freq: Two times a day (BID) | INTRAVENOUS | Status: DC

## 2021-12-07 MED ORDER — CEFAZOLIN SODIUM-DEXTROSE 1-4 GM/50ML-% IV SOLN
1.0000 g | Freq: Three times a day (TID) | INTRAVENOUS | Status: AC
  Administered 2021-12-07 (×2): 1 g via INTRAVENOUS
  Filled 2021-12-07 (×2): qty 50

## 2021-12-07 MED ORDER — OXYCODONE HCL 5 MG/5ML PO SOLN: 5.0000 mg | Freq: Once | ORAL | Status: AC | PRN

## 2021-12-07 MED ORDER — ONDANSETRON HCL 4 MG/2ML IJ SOLN
INTRAMUSCULAR | Status: DC | PRN
  Administered 2021-12-07: 4 mg via INTRAVENOUS

## 2021-12-07 MED ORDER — FENTANYL CITRATE (PF) 250 MCG/5ML IJ SOLN
INTRAMUSCULAR | Status: DC | PRN
  Administered 2021-12-07: 25 ug via INTRAVENOUS
  Administered 2021-12-07: 50 ug via INTRAVENOUS
  Administered 2021-12-07: 25 ug via INTRAVENOUS
  Administered 2021-12-07: 50 ug via INTRAVENOUS
  Administered 2021-12-07: 25 ug via INTRAVENOUS
  Administered 2021-12-07: 50 ug via INTRAVENOUS
  Administered 2021-12-07 (×2): 25 ug via INTRAVENOUS
  Administered 2021-12-07: 50 ug via INTRAVENOUS

## 2021-12-07 MED ORDER — METOPROLOL SUCCINATE ER 25 MG PO TB24
25.0000 mg | ORAL_TABLET | Freq: Every day | ORAL | Status: DC
  Administered 2021-12-08: 25 mg via ORAL
  Filled 2021-12-07: qty 1

## 2021-12-07 MED ORDER — MENTHOL 3 MG MT LOZG: 1.0000 | LOZENGE | OROMUCOSAL | Status: DC | PRN

## 2021-12-07 MED ORDER — HYDROMORPHONE HCL 1 MG/ML IJ SOLN: 1.0000 mg | INTRAMUSCULAR | Status: AC | PRN

## 2021-12-07 MED ORDER — BUPROPION HCL ER (XL) 300 MG PO TB24
450.0000 mg | ORAL_TABLET | Freq: Every day | ORAL | Status: DC
  Administered 2021-12-08: 450 mg via ORAL
  Filled 2021-12-07: qty 1

## 2021-12-07 MED ORDER — METHOCARBAMOL 1000 MG/10ML IJ SOLN
500.0000 mg | Freq: Four times a day (QID) | INTRAVENOUS | Status: DC | PRN
  Filled 2021-12-07: qty 5

## 2021-12-07 MED ORDER — DEXAMETHASONE 4 MG PO TABS
4.0000 mg | ORAL_TABLET | Freq: Four times a day (QID) | ORAL | Status: DC
  Administered 2021-12-07 – 2021-12-08 (×4): 4 mg via ORAL
  Filled 2021-12-07 (×4): qty 1

## 2021-12-07 MED ORDER — EPHEDRINE SULFATE-NACL 50-0.9 MG/10ML-% IV SOSY
PREFILLED_SYRINGE | INTRAVENOUS | Status: DC | PRN
  Administered 2021-12-07: 10 mg via INTRAVENOUS
  Administered 2021-12-07: 15 mg via INTRAVENOUS

## 2021-12-07 MED ORDER — FENTANYL CITRATE (PF) 100 MCG/2ML IJ SOLN
25.0000 ug | INTRAMUSCULAR | Status: DC | PRN
  Administered 2021-12-07 (×4): 25 ug via INTRAVENOUS

## 2021-12-07 MED ORDER — 0.9 % SODIUM CHLORIDE (POUR BTL) OPTIME
TOPICAL | Status: DC | PRN
  Administered 2021-12-07 (×2): 1000 mL

## 2021-12-07 MED ORDER — SODIUM CHLORIDE 0.9% FLUSH: 3.0000 mL | INTRAVENOUS | Status: DC | PRN

## 2021-12-07 MED ORDER — ONDANSETRON HCL 4 MG PO TABS: 4.0000 mg | ORAL_TABLET | Freq: Four times a day (QID) | ORAL | Status: DC | PRN

## 2021-12-07 MED ORDER — METHOCARBAMOL 500 MG PO TABS
500.0000 mg | ORAL_TABLET | Freq: Four times a day (QID) | ORAL | Status: DC | PRN
  Administered 2021-12-07 – 2021-12-08 (×4): 500 mg via ORAL
  Filled 2021-12-07 (×4): qty 1

## 2021-12-07 MED ORDER — KETOTIFEN FUMARATE 0.025 % OP SOLN
1.0000 [drp] | Freq: Every morning | OPHTHALMIC | Status: DC
  Administered 2021-12-08: 1 [drp] via OPHTHALMIC
  Filled 2021-12-07: qty 5

## 2021-12-07 MED ORDER — PANTOPRAZOLE SODIUM 40 MG PO TBEC
80.0000 mg | DELAYED_RELEASE_TABLET | Freq: Two times a day (BID) | ORAL | Status: DC
  Administered 2021-12-07 – 2021-12-08 (×2): 80 mg via ORAL
  Filled 2021-12-07 (×2): qty 2

## 2021-12-07 MED ORDER — FENTANYL CITRATE (PF) 100 MCG/2ML IJ SOLN
INTRAMUSCULAR | Status: AC
  Filled 2021-12-07: qty 2

## 2021-12-07 MED ORDER — PHENOL 1.4 % MT LIQD: 1.0000 | OROMUCOSAL | Status: DC | PRN

## 2021-12-07 MED ORDER — BUPIVACAINE-EPINEPHRINE (PF) 0.25% -1:200000 IJ SOLN
INTRAMUSCULAR | Status: AC
  Filled 2021-12-07: qty 30

## 2021-12-07 MED ORDER — ROCURONIUM BROMIDE 10 MG/ML (PF) SYRINGE
PREFILLED_SYRINGE | INTRAVENOUS | Status: DC | PRN
Start: 2021-12-07 — End: 2021-12-07
  Administered 2021-12-07: 30 mg via INTRAVENOUS
  Administered 2021-12-07: 50 mg via INTRAVENOUS

## 2021-12-07 MED ORDER — ACETAMINOPHEN 325 MG PO TABS
650.0000 mg | ORAL_TABLET | ORAL | Status: DC | PRN
  Administered 2021-12-08 (×2): 650 mg via ORAL
  Filled 2021-12-07 (×2): qty 2

## 2021-12-07 MED ORDER — OXYCODONE HCL 5 MG PO TABS
10.0000 mg | ORAL_TABLET | ORAL | Status: DC | PRN
  Administered 2021-12-07 – 2021-12-08 (×7): 10 mg via ORAL
  Filled 2021-12-07 (×6): qty 2

## 2021-12-07 MED ORDER — ATORVASTATIN CALCIUM 10 MG PO TABS
10.0000 mg | ORAL_TABLET | Freq: Every evening | ORAL | Status: DC
  Administered 2021-12-07: 10 mg via ORAL
  Filled 2021-12-07: qty 1

## 2021-12-07 MED ORDER — ORAL CARE MOUTH RINSE: 15.0000 mL | Freq: Once | OROMUCOSAL | Status: AC

## 2021-12-07 MED ORDER — MIDAZOLAM HCL 2 MG/2ML IJ SOLN
INTRAMUSCULAR | Status: AC
  Filled 2021-12-07: qty 2

## 2021-12-07 MED ORDER — HYDROXYCHLOROQUINE SULFATE 200 MG PO TABS
200.0000 mg | ORAL_TABLET | Freq: Two times a day (BID) | ORAL | Status: DC
  Administered 2021-12-07 – 2021-12-08 (×2): 200 mg via ORAL
  Filled 2021-12-07 (×4): qty 1

## 2021-12-07 MED ORDER — BUPIVACAINE-EPINEPHRINE 0.25% -1:200000 IJ SOLN
INTRAMUSCULAR | Status: DC | PRN
  Administered 2021-12-07: 10 mL

## 2021-12-07 MED ORDER — DOXYCYCLINE HYCLATE 20 MG PO TABS
20.0000 mg | ORAL_TABLET | Freq: Two times a day (BID) | ORAL | Status: DC
Start: 2021-12-07 — End: 2021-12-08

## 2021-12-07 MED ORDER — POLYETHYLENE GLYCOL 3350 17 G PO PACK: 17.0000 g | PACK | Freq: Every day | ORAL | Status: DC | PRN

## 2021-12-07 MED ORDER — NITROGLYCERIN 0.4 MG SL SUBL: 0.4000 mg | SUBLINGUAL_TABLET | SUBLINGUAL | Status: DC | PRN

## 2021-12-07 MED ORDER — PREGABALIN 75 MG PO CAPS
75.0000 mg | ORAL_CAPSULE | Freq: Two times a day (BID) | ORAL | Status: DC
  Administered 2021-12-07 – 2021-12-08 (×2): 75 mg via ORAL
  Filled 2021-12-07 (×2): qty 1

## 2021-12-07 MED ORDER — DOCUSATE SODIUM 100 MG PO CAPS
100.0000 mg | ORAL_CAPSULE | Freq: Two times a day (BID) | ORAL | Status: DC | PRN
  Administered 2021-12-07: 100 mg via ORAL
  Filled 2021-12-07: qty 1

## 2021-12-07 MED ORDER — SURGIFLO WITH THROMBIN (HEMOSTATIC MATRIX KIT) OPTIME
TOPICAL | Status: DC | PRN
  Administered 2021-12-07: 2 via TOPICAL

## 2021-12-07 MED ORDER — ARIPIPRAZOLE 2 MG PO TABS
2.0000 mg | ORAL_TABLET | Freq: Every day | ORAL | Status: DC
  Administered 2021-12-07: 2 mg via ORAL
  Filled 2021-12-07 (×2): qty 1

## 2021-12-07 MED ORDER — OXYCODONE HCL 5 MG PO TABS
ORAL_TABLET | ORAL | Status: AC
  Filled 2021-12-07: qty 1

## 2021-12-07 MED ORDER — PHENYLEPHRINE HCL (PRESSORS) 10 MG/ML IV SOLN
INTRAVENOUS | Status: AC
  Filled 2021-12-07: qty 1

## 2021-12-07 MED ORDER — PHENYLEPHRINE 40 MCG/ML (10ML) SYRINGE FOR IV PUSH (FOR BLOOD PRESSURE SUPPORT)
PREFILLED_SYRINGE | INTRAVENOUS | Status: DC | PRN
  Administered 2021-12-07: 120 ug via INTRAVENOUS

## 2021-12-07 MED ORDER — FLEET ENEMA 7-19 GM/118ML RE ENEM: 1.0000 | ENEMA | Freq: Once | RECTAL | Status: DC | PRN

## 2021-12-07 MED ORDER — LACTATED RINGERS IV SOLN: INTRAVENOUS | Status: DC | PRN

## 2021-12-07 MED ORDER — LIDOCAINE 2% (20 MG/ML) 5 ML SYRINGE
INTRAMUSCULAR | Status: DC | PRN
  Administered 2021-12-07: 60 mg via INTRAVENOUS

## 2021-12-07 MED ORDER — DEXAMETHASONE SODIUM PHOSPHATE 10 MG/ML IJ SOLN
INTRAMUSCULAR | Status: DC | PRN
Start: 2021-12-07 — End: 2021-12-07
  Administered 2021-12-07: 4 mg via INTRAVENOUS

## 2021-12-07 MED ORDER — LEVOTHYROXINE SODIUM 75 MCG PO TABS
150.0000 ug | ORAL_TABLET | Freq: Every day | ORAL | Status: DC
  Administered 2021-12-08: 150 ug via ORAL
  Filled 2021-12-07: qty 2

## 2021-12-07 MED ORDER — THROMBIN 20000 UNITS EX KIT
PACK | CUTANEOUS | Status: AC
  Filled 2021-12-07: qty 1

## 2021-12-07 MED ORDER — BUPIVACAINE HCL (PF) 0.5 % IJ SOLN
INTRAMUSCULAR | Status: DC | PRN
Start: 2021-12-07 — End: 2021-12-07
  Administered 2021-12-07: 15 mL via PERINEURAL

## 2021-12-07 MED ORDER — PHENYLEPHRINE HCL-NACL 20-0.9 MG/250ML-% IV SOLN
INTRAVENOUS | Status: DC | PRN
  Administered 2021-12-07: 50 ug/min via INTRAVENOUS
  Administered 2021-12-07: 20 ug/min via INTRAVENOUS

## 2021-12-07 MED ORDER — DEXAMETHASONE SODIUM PHOSPHATE 4 MG/ML IJ SOLN: 4.0000 mg | Freq: Four times a day (QID) | INTRAMUSCULAR | Status: DC

## 2021-12-07 MED ORDER — MOMETASONE FURO-FORMOTEROL FUM 200-5 MCG/ACT IN AERO
2.0000 | INHALATION_SPRAY | Freq: Two times a day (BID) | RESPIRATORY_TRACT | Status: DC
  Administered 2021-12-07 – 2021-12-08 (×2): 2 via RESPIRATORY_TRACT
  Filled 2021-12-07: qty 8.8

## 2021-12-07 SURGICAL SUPPLY — 98 items
AGENT HMST KT MTR STRL THRMB (HEMOSTASIS) ×2
APPLIER CLIP 11 MED OPEN (CLIP) ×4
APR CLP MED 11 20 MLT OPN (CLIP) ×2
BAG COUNTER SPONGE SURGICOUNT (BAG) ×6 IMPLANT
BAG SPNG CNTER NS LX DISP (BAG) ×2
BLADE CLIPPER SURG (BLADE) IMPLANT
BLADE ILLUMINATOR MIS (MISCELLANEOUS) ×1 IMPLANT
BLADE SURG 10 STRL SS (BLADE) ×3 IMPLANT
BONE MATRIX OSTEOCEL PRO MED (Bone Implant) ×1 IMPLANT
CABLE BIPOLOR RESECTION CORD (MISCELLANEOUS) ×3 IMPLANT
CATH FOLEY 2WAY SLVR  5CC 16FR (CATHETERS) ×2
CATH FOLEY 2WAY SLVR 5CC 16FR (CATHETERS) ×2 IMPLANT
CLIP APPLIE 11 MED OPEN (CLIP) ×4 IMPLANT
CLIP LIGATING EXTRA MED SLVR (CLIP) ×3 IMPLANT
CLIP NEUROVISION LG (CLIP) ×1 IMPLANT
CLSR STERI-STRIP ANTIMIC 1/2X4 (GAUZE/BANDAGES/DRESSINGS) IMPLANT
COVER SURGICAL LIGHT HANDLE (MISCELLANEOUS) ×3 IMPLANT
DISSECTOR BLUNT TIP ENDO 5MM (MISCELLANEOUS) ×6 IMPLANT
DRAIN CHANNEL 15F RND FF W/TCR (WOUND CARE) IMPLANT
DRAPE C-ARM 42X72 X-RAY (DRAPES) ×3 IMPLANT
DRAPE C-ARMOR (DRAPES) ×3 IMPLANT
DRAPE INCISE IOBAN 66X45 STRL (DRAPES) ×1 IMPLANT
DRSG OPSITE POSTOP 3X4 (GAUZE/BANDAGES/DRESSINGS) ×3 IMPLANT
DRSG OPSITE POSTOP 4X10 (GAUZE/BANDAGES/DRESSINGS) ×3 IMPLANT
DRSG OPSITE POSTOP 4X6 (GAUZE/BANDAGES/DRESSINGS) ×4 IMPLANT
DURAPREP 26ML APPLICATOR (WOUND CARE) ×3 IMPLANT
ELECT BLADE 4.0 EZ CLEAN MEGAD (MISCELLANEOUS) ×2
ELECT BLADE 6.5 EXT (BLADE) ×3 IMPLANT
ELECT CAUTERY BLADE 6.4 (BLADE) ×3 IMPLANT
ELECT PENCIL ROCKER SW 15FT (MISCELLANEOUS) ×3 IMPLANT
ELECT REM PT RETURN 9FT ADLT (ELECTROSURGICAL) ×2
ELECTRODE BLDE 4.0 EZ CLN MEGD (MISCELLANEOUS) ×2 IMPLANT
ELECTRODE REM PT RTRN 9FT ADLT (ELECTROSURGICAL) ×2 IMPLANT
GAUZE 4X4 16PLY ~~LOC~~+RFID DBL (SPONGE) IMPLANT
GLOVE SRG 8 PF TXTR STRL LF DI (GLOVE) ×2 IMPLANT
GLOVE SURG ENC MOIS LTX SZ6.5 (GLOVE) ×3 IMPLANT
GLOVE SURG ENC MOIS LTX SZ7.5 (GLOVE) ×3 IMPLANT
GLOVE SURG MICRO LTX SZ7.5 (GLOVE) ×3 IMPLANT
GLOVE SURG MICRO LTX SZ8.5 (GLOVE) ×3 IMPLANT
GLOVE SURG UNDER POLY LF SZ6.5 (GLOVE) ×3 IMPLANT
GLOVE SURG UNDER POLY LF SZ8 (GLOVE) ×2
GLOVE SURG UNDER POLY LF SZ8.5 (GLOVE) ×3 IMPLANT
GOWN STRL REUS W/ TWL LRG LVL3 (GOWN DISPOSABLE) ×4 IMPLANT
GOWN STRL REUS W/ TWL XL LVL3 (GOWN DISPOSABLE) ×4 IMPLANT
GOWN STRL REUS W/TWL 2XL LVL3 (GOWN DISPOSABLE) ×6 IMPLANT
GOWN STRL REUS W/TWL LRG LVL3 (GOWN DISPOSABLE) ×4
GOWN STRL REUS W/TWL XL LVL3 (GOWN DISPOSABLE) ×4
GUIDEWIRE NITINOL BEVEL TIP (WIRE) ×4 IMPLANT
INSERT FOGARTY 61MM (MISCELLANEOUS) IMPLANT
INSERT FOGARTY SM (MISCELLANEOUS) IMPLANT
KIT BASIN OR (CUSTOM PROCEDURE TRAY) ×3 IMPLANT
KIT TURNOVER KIT B (KITS) ×3 IMPLANT
MODULE EMG NDL SSEP NVM5 (NEEDLE) IMPLANT
MODULE EMG NEEDLE SSEP NVM5 (NEEDLE) ×2 IMPLANT
MODULE NVM5 NEXT GEN EMG (NEEDLE) ×1 IMPLANT
NDL SPNL 18GX3.5 QUINCKE PK (NEEDLE) ×2 IMPLANT
NEEDLE 22X1 1/2 (OR ONLY) (NEEDLE) ×3 IMPLANT
NEEDLE SPNL 18GX3.5 QUINCKE PK (NEEDLE) ×2 IMPLANT
NS IRRIG 1000ML POUR BTL (IV SOLUTION) ×3 IMPLANT
PACK LAMINECTOMY ORTHO (CUSTOM PROCEDURE TRAY) ×3 IMPLANT
PACK UNIVERSAL I (CUSTOM PROCEDURE TRAY) ×3 IMPLANT
PAD ARMBOARD 7.5X6 YLW CONV (MISCELLANEOUS) ×12 IMPLANT
PROBE BALL TIP NVM5 SNG USE (BALLOONS) ×1 IMPLANT
ROD RELINE MAS TI LORD 5.5X40 (Rod) ×2 IMPLANT
SCREW LOCK RELINE 5.5 TULIP (Screw) ×4 IMPLANT
SCREW RELINE MAS POLY 6.5X40MM (Screw) ×2 IMPLANT
SCREW RELINE RED 6.5X45MM POLY (Screw) ×2 IMPLANT
SPACER RISE-L 18X45 8-15MM6DEG (Spacer) ×1 IMPLANT
SPONGE INTESTINAL PEANUT (DISPOSABLE) ×3 IMPLANT
SPONGE SURGIFOAM ABS GEL 100 (HEMOSTASIS) ×3 IMPLANT
SPONGE T-LAP 18X18 ~~LOC~~+RFID (SPONGE) ×6 IMPLANT
SPONGE T-LAP 4X18 ~~LOC~~+RFID (SPONGE) ×9 IMPLANT
STAPLER VISISTAT 35W (STAPLE) ×3 IMPLANT
SURGIFLO W/THROMBIN 8M KIT (HEMOSTASIS) ×2 IMPLANT
SUT BONE WAX W31G (SUTURE) IMPLANT
SUT MNCRL AB 3-0 PS2 27 (SUTURE) ×6 IMPLANT
SUT PDS AB 1 CTX 36 (SUTURE) ×6 IMPLANT
SUT PROLENE 4 0 RB 1 (SUTURE)
SUT PROLENE 4-0 RB1 .5 CRCL 36 (SUTURE) IMPLANT
SUT PROLENE 5 0 C 1 24 (SUTURE) ×3 IMPLANT
SUT PROLENE 5 0 CC1 (SUTURE) IMPLANT
SUT PROLENE 6 0 C 1 30 (SUTURE) ×3 IMPLANT
SUT PROLENE 6 0 CC (SUTURE) IMPLANT
SUT SILK 0 TIES 10X30 (SUTURE) ×3 IMPLANT
SUT SILK 2 0 TIES 10X30 (SUTURE) ×6 IMPLANT
SUT SILK 2 0SH CR/8 30 (SUTURE) IMPLANT
SUT SILK 3 0 TIES 10X30 (SUTURE) ×3 IMPLANT
SUT SILK 3 0 TIES 17X18 (SUTURE) ×2
SUT SILK 3 0SH CR/8 30 (SUTURE) IMPLANT
SUT SILK 3-0 18XBRD TIE BLK (SUTURE) ×2 IMPLANT
SUT VIC AB 1 CT1 18XCR BRD 8 (SUTURE) ×4 IMPLANT
SUT VIC AB 1 CT1 8-18 (SUTURE) ×4
SUT VIC AB 2-0 CT1 18 (SUTURE) ×7 IMPLANT
SYR BULB IRRIG 60ML STRL (SYRINGE) ×3 IMPLANT
TAPE CLOTH 4X10 WHT NS (GAUZE/BANDAGES/DRESSINGS) ×9 IMPLANT
TOWEL GREEN STERILE (TOWEL DISPOSABLE) ×6 IMPLANT
TOWEL GREEN STERILE FF (TOWEL DISPOSABLE) ×3 IMPLANT
WATER STERILE IRR 1000ML POUR (IV SOLUTION) ×3 IMPLANT

## 2021-12-07 NOTE — Anesthesia Procedure Notes (Signed)
Procedure Name: Intubation Date/Time: 12/07/2021 9:45 AM Performed by: Minerva Ends, CRNA Pre-anesthesia Checklist: Patient identified, Emergency Drugs available, Suction available and Patient being monitored Patient Re-evaluated:Patient Re-evaluated prior to induction Oxygen Delivery Method: Circle system utilized Preoxygenation: Pre-oxygenation with 100% oxygen Induction Type: IV induction Ventilation: Mask ventilation without difficulty Laryngoscope Size: Mac and 3 Grade View: Grade I Tube type: Oral Tube size: 6.5 mm Number of attempts: 1 Airway Equipment and Method: Stylet Placement Confirmation: ETT inserted through vocal cords under direct vision, positive ETCO2 and breath sounds checked- equal and bilateral Secured at: 22 cm Tube secured with: Tape Dental Injury: Teeth and Oropharynx as per pre-operative assessment

## 2021-12-07 NOTE — Discharge Instructions (Signed)

## 2021-12-07 NOTE — H&P (Signed)
Addendum H&P: Patient presents today with ongoing significant back buttock and neuropathic leg pain.  She has failed to improve despite appropriate conservative management is elected to move forward with surgery.  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  As result of the structural abnormality and the failure to improve with conservative management we elected to move forward with surgery.  There has been no change in her clinical exam since her last office visit of 11/28/2020.  All appropriate risks, benefits, and alternatives were discussed with the patient and consent was obtained.  All of her questions were encouraged and addressed and she is expressed a willingness to move forward with surgery.

## 2021-12-07 NOTE — Anesthesia Procedure Notes (Signed)
Anesthesia Regional Block: TAP block   Pre-Anesthetic Checklist: , timeout performed,  Correct Patient, Correct Site, Correct Laterality,  Correct Procedure, Correct Position, site marked,  Risks and benefits discussed,  Surgical consent,  Pre-op evaluation,  At surgeon's request and post-op pain management  Laterality: Left  Prep: chloraprep       Needles:  Injection technique: Single-shot  Needle Type: Echogenic Needle     Needle Length: 9cm  Needle Gauge: 21     Additional Needles:   Narrative:  Start time: 12/07/2021 8:05 AM End time: 12/07/2021 8:15 AM Injection made incrementally with aspirations every 5 mL.  Performed by: Personally  Anesthesiologist: Albertha Ghee, MD  Additional Notes: Pt tolerated the procedure well.

## 2021-12-07 NOTE — Transfer of Care (Signed)
Immediate Anesthesia Transfer of Care Note  Patient: [name]  Procedure(s) Performed: OBLIQUE LUMBAR INTERBODY FUSION 1 LEVEL WITH PERCUTANEOUS SCREWS (OLIF L4-5 WITH POSTERIOR SPINAL FUSION AND PEDICLE SCREWS) ABDOMINAL EXPOSURE  Patient Location: PACU  Anesthesia Type:General  Level of Consciousness: awake and alert   Airway & Oxygen Therapy: Patient Spontanous Breathing and Patient connected to nasal cannula oxygen  Post-op Assessment: Report given to RN, Post -op Vital signs reviewed and stable and Patient moving all extremities X 4  Post vital signs: Reviewed and stable  Last Vitals:  Vitals Value Taken Time  BP 142/72   Temp    Pulse 72   Resp 17   SpO2 100     Last Pain:  Vitals:   12/07/21 0712  PainSc: 5          Complications: No notable events documented.

## 2021-12-07 NOTE — Op Note (Signed)
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 Monica Martinez  ANESTHESIA:   General  EBL: 038 ml   Complications: None  Implants: Globus: Rise-L oblique lumbar cage: 18 mm wide 8-15 expandable cage.  6 degree lordotic.  Expanded to final height of 13 mm.      NuVasive MIS pedicle screw fixation: L4: 6.5x45 length.  L5: 6.5 x 40 mm length.  40 mm length rod.  Graft: Osteocell  Neuromonitoring: All 4 pedicle screws demonstrated no abnormal activity at greater than 40 mA.  No abnormal free running EMG or SSEP activity throughout the case.  BRIEF HISTORY: [name] is a 66 y.o. female who presented to my office with complaints of significant back buttock and radicular leg pain.  Despite prolonged conservative management her quality of life continues to deteriorate.  As result we elected to move forward with surgery.  Decision was made to do an oblique lumbar interbody fusion to address the spondylolisthesis and indirectly decompress the spinal canal to address the nerve compression.  This will be supplemented with posterior pedicle screw fixation.  All appropriate risks, benefits, and alternatives to surgery were discussed and consent was obtained.  PROCEDURE DETAILS: Patient was brought into the operating room and was properly positioned on the operating room table.  After induction with general anesthesia the patient was endotracheally intubated.  A timeout was taken to confirm all important data: including patient, procedure, and the level. Teds, SCD's were applied.   The neuro monitoring representative placed all  appropriate needles and pads for intraoperative SSEP and EMG monitoring.  Only was placed by the nurse and the patient was turned into the left lateral decubitus position.  Axillary roll was placed and all bony prominences were well-padded.  The left arm was placed in arm holder.  Using imaging I made sure we had satisfactory visualization in both planes at the L4-5 disc space level.  The patient was then secured firmly to the bed with tape to prevent motion.  The patient secured to the bed and x-rays demonstrating satisfactory positioning of the L4-5 disc space.  The abdomen and back were prepped and draped in a standard fashion.  At this point time Dr. Carlis Abbott performed a standard oblique retroperitoneal approach to the lumbar spine.  Please refer to his dictation for specifics on the approach.  Once the L4-5 disc space was visualized and the retractors were in place I scrubbed into the case to proceed with the surgery.  A needle was placed into the L4-5 disc space and I confirmed that we are at the appropriate level.  An annulotomy was performed and I remove the bulk of the disc material with pituitary rongeurs.  I then used Cobb elevators to release the disc from the endplate and to release the contralateral annulus.  I then used a box osteotome to remove the bulk of the disc material.  I then continued to use curettes and pituitary rongeurs to remove all of the disc material.  I then used a fine nerve hook to dissect through the posterior annulus and I used my 2 mm Kerrison rongeur to resect this.  I then confirmed with x-ray  that I was able to find the vertebral body of L4 and I had adequately released the posterior annulus.  At this point I then placed the trialing device and elected to use the 45 mm length 18 mm wide device.  The implant was obtained and packed with the allograft.  I then used a rasp to ensure I had bleeding subchondral bone.  After irrigation I then placed a cage and malleted across the  disc space.  I made sure that the posterior aspect of the cage remained completely on the endplate of L5.  The L4 slip had reduced somewhat.  I did note that the cage was slightly behind the L4 vertebral body by a few millimeters but I felt as though this was fine since I was still seated on the L5 endplate completely.  I then expanded the cage from 8 mm to a final height of 13.  I then inserted backfilled the cage with additional bone graft and then put additional bone graft anterior to the cage.  This point I irrigated the wound copiously normal saline and took final x-rays.  The imaging demonstrated satisfactory position of the cage in both planes.  The retracting blades were then removed and I irrigated once more.  Hemostasis was confirmed.  I then closed the fascia of the external oblique with a running #1 PDS suture.  We then closed in a layered fashion with interrupted 0 Vicryl, 2-0 Vicryl, and a 3-0 Monocryl for the skin.  Steri-Strips and dry dressings were applied.  An intraoperative AP x-ray was taken and read by the radiologist confirming there were no instruments in the field only the surgical implant.  At this point I then changed position so I was looking at the back.  I then used fluoroscopy to identify the L4 and L5 pedicles and marked out there position on the patient's back.  I infiltrated that area with quarter percent Marcaine with epinephrine.  Small incisions were made and then I advanced a Jamshidi needle percutaneously down to the lateral aspect of the L4 pedicle.  As I advanced the Jamshidi needle into the pedicle I confirmed using fluoroscopy that I was within the pedicle itself and there is no abnormal free running EMG activity to suggest a breach.  As I needed the medial wall of the pedicle on this view I switched to the lateral view to confirm that I was just beyond the posterior wall of the vertebral body.  Once I confirmed trajectory and position I advanced into the vertebral body  and then placed a guidepin to cannulate the pedicle.  The Jamshidi needle was removed.  I repeated this exact same procedure on the contralateral side and at the L5 pedicle.  Once all 4 pedicles were cannulated I then measured and obtained the appropriate size pedicle screw.  The pedicle screw was advanced over the guidepin.  I confirmed the x-ray with the fluoroscopy machine and I confirmed that there was no abnormal free running EMG activity.  Once the pedicle was advanced beyond the posterior vertebral body I removed the guidepin and advanced the pedicle screw to its final position.  I repeated this exact same technique at the other 3 pedicles.  Once all 4 pedicle screws were in place I then directly stimulated them to ensure that there was no adverse activity.  There was no adverse activity at greater than 40 mA.  With the screws in place I then measured and placed the appropriate size rod.  The  locking caps were then inserted and then torqued according manufacture standards.  The insertion tabs were removed and then I took my final x-rays.  We had satisfactory pedicle screw position as well as the cage.  There is no migration or movement of the implants.  At this point the posterior incisions were irrigated copiously normal saline and closed in a layered fashion with interrupted #1 Vicryl suture, 2-0 Vicryl suture, and 3-0 Monocryl for the skin.  Steri-Strips and dry dressing were lied and the patient was ultimately extubated and transferred to the PACU without incident.  The end of the case all needle and sponge counts were correct.  Melina Schools, MD 12/07/2021 11:55 AM

## 2021-12-07 NOTE — Op Note (Signed)
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 Marty Heck, MD  Co-surgeon: Dr. Melina Schools, MD  Assistant: Nelson Chimes, PA  Indications: Patient is a 66 year old female with significant lower back pain with neuropathic leg pain.  She has failed conservative management and it is felt that her degenerative spondylolisthesis at L4-L5 is producing moderate to severe canal stenosis and the cause of her symptoms.  She presents today for planned abdominal exposure at the L4-L5 disc base for L4-L5 OLIF after risk-benefit is discussed.  Findings: The patient was placed in the right lateral position and all pressure points padded.  The L4-L5 disc space was marked with fluoroscopic C-arm over the left oblique muscles.  An oblique incision was then made over the L4-L5 disc space over the left oblique muscles lateral to her retrorectus mesh.  Muscle sparing technique was then performed between the oblique muscles into the retroperitoneum and the peritoneum and the left ureter was then gently mobilized out of the retroperitoneum and down to exposure the disc space.  The left iliac vessels and left psoas were visualized and the left psoas was mobilized lateral to the disc base.  Fixed retractors were placed and we confirmed we were at the correct level with a spinal needle in the L4-L5 disc space.  Anesthesia: General  Details: Patient was taken to the operating room after informed consent was obtained.  Placed on the operating table in supine position and general endotracheal anesthesia was induced.  The patient was then rolled in the right lateral position and all pressure points padded.  A fluoroscopic C-arm was then brought in the lateral position and the L4-L5 disc space was marked over the left oblique  muscles.  This was 2 fingerbreadths off the iliac crest and lateral to her rectus muscle where she has had previous hernia repair with retrorectus mesh.  The abdominal wall, left flank, and back were then prepped and draped in standard sterile fashion.  A timeout was performed to identify patient, procedure and site.  Initially made an incision over our preoperative mark over the left oblique muscles and dissected down with Bovie cautery until we encountered the external oblique fascia and this was divided with Bovie cautery and cerebellar retractors were placed for for added visualization.  Muscle-sparing technique was performed to divide the layers of the oblique muscles until we visualized the peritoneum and then this was mobilized down out of the retroperitoneum including the ureter and this was gently mobilized bluntly with finger dissection.  There were a fair amount of adhesions in the retroperitoneum from her previous colectomy and these were taken down bluntly with finger dissection.  Ultimately I could visualize the left psoas and the left iliac vessels and we used hand held Wiley retractors for added visualization.  The left psoas was then mobilized lateral to the L4-L5 disc space with suction and laparoscopic dissector.  Ultimately after we had good working room at the disc space, I then placed the fixed this retractor with a 160 lip retractor medial to the disc base pulling the vessels to the midline and then two 180 lip retractors pulling the left psoas lateral to the disc space.  A needle was placed in the disc space and we confirmed on lateral fluoroscopy we were at the L4-L5 level.  Case was turned over Dr. Rolena Infante.  Please see his dictation for the remainder of the case.  Complication: None  Condition: Stable  Marty Heck, MD [facility] Office: [place]

## 2021-12-07 NOTE — Brief Op Note (Signed)
12/07/2021  12:17 PM  PATIENT:  [name]  66 y.o. 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 CLARK TO DO APPROACH LEFT TAP BLOCK WITH EXPAREL 3 C-BED ABDOMINAL EXPOSURE (N/A)  SURGEON:  Surgeon(s) and Role: Panel 1:    Melina Schools, MD - Primary Panel 2:    * Marty Heck, MD - Primary  PHYSICIAN ASSISTANT:   ASSISTANTS: Nelson Chimes, PA   ANESTHESIA:   general  EBL:  125 mL   BLOOD ADMINISTERED:none  DRAINS: none   LOCAL MEDICATIONS USED:  MARCAINE     SPECIMEN:  No Specimen  DISPOSITION OF SPECIMEN:  N/A  COUNTS:  YES  TOURNIQUET:  * No tourniquets in log *  DICTATION: .Dragon Dictation  PLAN OF CARE: Admit to inpatient   PATIENT DISPOSITION:  PACU - hemodynamically stable.

## 2021-12-07 NOTE — Anesthesia Postprocedure Evaluation (Signed)
Anesthesia Post Note  Patient: [name]  Procedure(s) Performed: OBLIQUE LUMBAR INTERBODY FUSION 1 LEVEL WITH PERCUTANEOUS SCREWS (OLIF L4-5 WITH POSTERIOR SPINAL FUSION AND PEDICLE SCREWS) ABDOMINAL EXPOSURE     Patient location during evaluation: PACU Anesthesia Type: General Level of consciousness: awake and alert Pain management: pain level controlled Vital Signs Assessment: post-procedure vital signs reviewed and stable Respiratory status: spontaneous breathing, nonlabored ventilation, respiratory function stable and patient connected to nasal cannula oxygen Cardiovascular status: blood pressure returned to baseline and stable Postop Assessment: no apparent nausea or vomiting Anesthetic complications: no   No notable events documented.  Last Vitals:  Vitals:   12/07/21 1545 12/07/21 1623  BP: 126/63 130/64  Pulse: 71 73  Resp: 13 18  Temp:  36.6 C  SpO2: 97% 100%    Last Pain:  Vitals:   12/07/21 1623  TempSrc: Oral  PainSc:                  [place]

## 2021-12-07 NOTE — H&P (Signed)
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  The various methods of treatment have been discussed with the patient and family. After consideration of risks, benefits and other options for treatment, the patient has consented to  Procedure(s) with comments: OBLIQUE LUMBAR INTERBODY FUSION 1 LEVEL WITH PERCUTANEOUS SCREWS (OLIF L4-5 WITH POSTERIOR SPINAL FUSION AND PEDICLE SCREWS) (N/A) - 4 HRS DR. Townsend Cudworth TO DO APPROACH LEFT TAP BLOCK WITH EXPAREL 3 C-BED ABDOMINAL EXPOSURE (N/A) as a surgical intervention.  The patient's history has been reviewed, patient examined, no change in status, stable for surgery.  I have reviewed the patient's chart and labs.  Questions were answered to the patient's satisfaction.    Abdominal exposure for L4-L5 OLIF  Marty Heck  Patient name: [name]   MRN: [id number]        DOB: 12-01-1955          Sex: female   REASON FOR CONSULT: Evaluate for L4-L5 OLIF   HPI: [name] is a 66 y.o. female, with history of fibromyalgia, endometrial cancer, chronic fatigue syndrome, atrial tachycardia that presents for evaluation of L4-L5 OLIF.  She describes severe left buttock and left leg pain ongoing over the last several months.  This has been very debilitating.  She had an MRI on 09/30/2021 that showed multilevel degenerative disc disease most prominent at L4-L5.  She's had significant history of abdominal surgery and has failed conservative management.  Dr. Rolena Infante has asked that I evaluate her for L4-L5 OLIF.  Her abdominal surgical history includes lap cholecystectomy, hysterectomy, ex lap with sigmoid colectomy subsequent reversal of her Hartman's colostomy with incisional hernia repair using retrorectus mesh and bilateral musculocutaneous advancement flaps.       Past Medical History:  Diagnosis Date   Asthma     Back pain      Chronic fatigue syndrome      Dr. Sabra Heck   Complication of anesthesia     Cough      /SOB, PFT's nl, improved with Bronchodilation 8/11   Dysrhythmia     Endometrial cancer ([place])     Esophageal spasm      GERD Dr. Sabra Heck, Dr. Wynetta Emery; egd 2006 nl; UGI 3/13 Small HH, moderate GERD, nl motility; seen at [facility] ([place]), egd? neg, sone improvement on sucralfate susp   Fibromyalgia     Foot pain      Dr. Rushie Nyhan   Hashimoto's thyroiditis     High triglycerides     Hypothyroidism      Dr. Lewis Shock 4/14   Leukocytoclastic vasculitis ([place])      Dr. Tonia Brooms   Low HDL (under 40)     Memory loss     Mitral valve prolapse     Paroxysmal atrial tachycardia ([place])      Dr. Marlou Porch   Pernicious anemia      Dr. Sabra Heck   PONV (postoperative nausea and vomiting)      along time ago had nausea ans vomiting after surgery-25 years ago   Reflux      ? delayed gastric emptying--GES nl 01/2012 (6% at 2hrs)   RLS (restless legs syndrome)      low ferritin Dr. Sabra Heck   Rosacea     Sleep apnea     Uterine carcinoma ([place])      Dr. Polly Cobia  Past Surgical History:  Procedure Laterality Date   ABDOMINAL HYSTERECTOMY       bladder tack        x 2   BUNIONECTOMY       CHOLECYSTECTOMY   2009   COLONOSCOPY        scr, 12/2008 ([place]) nl   COLOSTOMY Left 06/05/2018    Procedure: COLOSTOMY;  Surgeon: Ralene Ok, MD;  Location: [facility];  Service: General;  Laterality: Left;   COLOSTOMY REVERSAL N/A 10/16/2018    Procedure: HARTMANN'S COLOSTOMY REVERSAL, RIGID PROCTOSCOPY;  Surgeon: Ralene Ok, MD;  Location: [facility];  Service: General;  Laterality: N/A;   HAMMER TOE SURGERY       HYSTERECTOMY ABDOMINAL WITH SALPINGO-OOPHORECTOMY   2008   [place]   04/09/2019    x2   [place] N/A 04/09/2019    Procedure: INCISIONAL HERNIA REPAIR WITH MESH;  Surgeon: Ralene Ok, MD;  Location: [facility] OR;  Service: General;  Laterality: N/A;   LAPAROSCOPY N/A  10/16/2018    Procedure: LAPAROSCOPY DIAGNOSTIC WITH LYSIS OF ADHESIONS;  Surgeon: Ralene Ok, MD;  Location: [facility];  Service: General;  Laterality: N/A;   LUNG BIOPSY        L Lower lung pulm nodules, largest 4.34mm, low risk, repeat CT in 1 yr now following with pulm at [facility]; 9/13 Stable tiny lung nodules, no f/u needed   LYMPH NODE DISSECTION   2008   LYSIS OF ADHESION N/A 04/09/2019    Procedure: OPEN LYSIS OF ADHESION;  Surgeon: Ralene Ok, MD;  Location: [facility];  Service: General;  Laterality: N/A;   METATARSAL OSTEOTOMY       NECK SURGERY        x 2   PARTIAL COLECTOMY N/A 06/05/2018    Procedure: EXPLORATORY LAPAROTOMY AND PARTIAL COLECTOMY;  Surgeon: Ralene Ok, MD;  Location: [facility];  Service: General;  Laterality: N/A;   TONSILLECTOMY       US ECHOCARDIOGRAPHY        with Nuclear test, Dr. Marlou Porch, Low risk   VESICOVAGINAL FISTULA CLOSURE W/ TAH               Family History  Problem Relation Age of Onset   Hashimoto's thyroiditis Mother     High blood pressure Mother     Stroke Mother     Other Mother          Borderline DM   Alzheimer's disease Mother     Ulcerative colitis Father     Meniere's disease Father     Hearing loss Father     Stroke Father     Stroke Sister        SOCIAL HISTORY: Social History         Socioeconomic History   Marital status: Married      Spouse name: Not on file   Number of children: Not on file   Years of education: Not on file   Highest education level: Not on file  Occupational History   Not on file  Tobacco Use   Smoking status: Never   Smokeless tobacco: Never  Vaping Use   Vaping Use: Never used  Substance and Sexual Activity   Alcohol use: Yes      Comment: maybe a glass of wine/month   Drug use: Never   Sexual activity: Not on file  Other Topics Concern   Not on file  Social History Narrative    Diet: High calorie/  high fat         Caffeine: Yes         Married, if yes what year: Married,  1986         Do you live in a house, apartment, assisted living, condo, trailer, ect: House         Is it one or more stories: One story         How many persons live in your home? 2         Pets: 2         Highest level or education completed: completed college, some graduate school         Current/Past profession: Retail-Division management         Exercise: Rarely                 Type and how often: Not often- experience PEM              Living Will:     DNR:    POA/HPOA:         Functional Status:    Do you have difficulty bathing or dressing yourself? No    Do you have difficulty preparing food or eating?No    Do you have difficulty managing your medications?No    Do you have difficulty managing your finances?No    Do you have difficulty affording your medications?No    Social Determinants of Adult nurse Strain: Not on file  Food Insecurity: Not on file  Transportation Needs: Not on file  Physical Activity: Not on file  Stress: Not on file  Social Connections: Not on file  Intimate Partner Violence: Not on file           Allergies  Allergen Reactions   Aspirin Anaphylaxis   Bee Venom Shortness Of Breath, Itching, Swelling and Hives   Peanut-Containing Drug Products Anaphylaxis   Ambien [Zolpidem Tartrate] Other (See Comments)      Hallucinations   Tape Itching, Swelling and Rash   Ciprofloxacin Nausea And Vomiting   Latex Rash   Minocycline Rash   Shellfish Allergy Rash            Current Outpatient Medications  Medication Sig Dispense Refill   acetaminophen (TYLENOL) 500 MG tablet Take 2 tablets (1,000 mg total) by mouth every 6 (six) hours as needed. 30 tablet 0   albuterol (PROVENTIL HFA;VENTOLIN HFA) 108 (90 Base) MCG/ACT inhaler Inhale 2 puffs into the lungs every 4 (four) hours as needed for wheezing or shortness of breath.       ARIPiprazole (ABILIFY) 2 MG tablet Take 1 tablet (2 mg total) by mouth daily. 90 tablet 1   Ascorbic  Acid (VITAMIN C ER PO)         aspirin EC 81 MG tablet Take 1 tablet (81 mg total) by mouth daily. 90 tablet 3   atorvastatin (LIPITOR) 10 MG tablet TAKE 1 TABLET BY MOUTH  DAILY 90 tablet 3   buPROPion (WELLBUTRIN XL) 150 MG 24 hr tablet Take 450 mg by mouth daily.        Calcium Acetate, Phos Binder, (CALCIUM ACETATE PO)         Cholecalciferol (VITAMIN D3) 25 MCG (1000 UT) CAPS Take 2,000 Units by mouth 2 (two) times a day.        cyanocobalamin (,VITAMIN B-12,) 1000 MCG/ML injection Inject 1 mL (1,000 mcg total) into the muscle every 30 (thirty) days. 1 mL 1  cyclobenzaprine (FLEXERIL) 10 MG tablet Take 1 tablet (10 mg total) by mouth 3 (three) times daily as needed for muscle spasms. 30 tablet 0   Docusate Calcium (STOOL SOFTENER PO)         doxycycline (PERIOSTAT) 20 MG tablet Take 20 mg by mouth 2 (two) times daily.       DULoxetine (CYMBALTA) 30 MG capsule Take 90 mg by mouth at bedtime.        EPINEPHrine 0.3 mg/0.3 mL IJ SOAJ injection Inject 0.3 mg into the muscle as needed for anaphylaxis.        fexofenadine (ALLEGRA) 180 MG tablet 1 tablet as needed       fluticasone-salmeterol (ADVAIR HFA) 115-21 MCG/ACT inhaler Inhale 2 puffs into the lungs 2 (two) times daily.       gabapentin (NEURONTIN) 300 MG capsule Take 300 mg by mouth 3 (three) times daily.       ipratropium (ATROVENT) 0.06 % nasal spray ipratropium bromide 42 mcg (0.06 %) nasal spray       levothyroxine (SYNTHROID) 150 MCG tablet Take 150 mcg by mouth at bedtime.       metoprolol succinate (TOPROL-XL) 25 MG 24 hr tablet TAKE 1 TABLET BY MOUTH ONCE DAILY 90 tablet 3   metroNIDAZOLE (METROGEL) 0.75 % gel Apply 1 application topically 2 (two) times daily.       naproxen sodium (ALEVE) 220 MG tablet Take 220 mg by mouth at bedtime as needed (headache).       nitroGLYCERIN (NITROSTAT) 0.4 MG SL tablet Place 1 tablet (0.4 mg total) under the tongue every 5 (five) minutes as needed for chest pain. 25 tablet 3   omalizumab  (XOLAIR) 150 MG injection See admin instructions.       pantoprazole (PROTONIX) 40 MG tablet Take 40 mg by mouth 2 (two) times daily.       Polyethylene Glycol 3350 (MIRALAX PO)         pramipexole (MIRAPEX) 0.125 MG tablet pramipexole 0.125 mg tablet  TAKE ONE TABLET BY MOUTH AT BEDTIME ,INCREASE BY ONE TABLET EVERY 2-3 DAYS TO MAX OF 0.75MG  (SIX TABLETS). 30       pregabalin (LYRICA) 75 MG capsule Take 75 mg by mouth 2 (two) times daily.       Probiotic Product (PROBIOTIC PO) Take 2 capsules by mouth daily.       Syringe, Disposable, (B-D SYRINGE LUER-LOK 3CC) 3 ML MISC BD Luer-Lok Syringe 3 mL 25 gauge x 1"  Subcutaneous Syringes and Needles to Inject Vitamin B12 Monthly.       Adapalene (DIFFERIN EX)         ALPRAZolam (XANAX) 1 MG tablet (Schedule IV Drug)       Azelaic Acid (FINACEA EX) Apply 1 application topically daily.       azelastine (ASTELIN) 0.1 % nasal spray azelastine 137 mcg (0.1 %) nasal spray aerosol  ONE PUFF IN EACH NOSTRIL DAILY.       B-D 3CC LUER-LOK SYR 25GX1" 25G X 1" 3 ML MISC SMARTSIG:Syringe(s) SUB-Q       baclofen (LIORESAL) 10 MG tablet baclofen 10 mg tablet  TAKE ONE TABLET THREE TIMES DAILY AS NEEDED.       buPROPion (ZYBAN) 150 MG 12 hr tablet 1 tablet in the morning       cetirizine (ZYRTEC ALLERGY) 10 MG tablet 1 tablet       diclofenac sodium (VOLTAREN) 1 % GEL Apply 2 g topically 3 (three) times daily as  needed (pain).        Ferrous Sulfate Dried (FERROUS SULFATE CR PO)         Hydroxychloroquine Sulfate (PLAQUENIL PO)         Insulin Syringe-Needle U-100 (SAFETY INSULIN SYRINGES) 27G X 1/2" 1 ML MISC by Does not apply route.       LEVOTHYROXINE SODIUM PO         methylPREDNISolone (MEDROL) 4 MG tablet methylprednisolone 4 mg tablets in a dose pack  TAKE AS DIRECTED PER pack instructions       Needles & Syringes MISC Subcutaneous Syringes and Needles to Inject Vitamin B12 Monthly. 3 each 3   predniSONE (DELTASONE) 20 MG tablet 2 po at same time daily  for 5 days 10 tablet 0   simvastatin (ZOCOR) 20 MG tablet Take 1 tablet by mouth at bedtime.       sucralfate (CARAFATE) 1 g tablet sucralfate 1 gram tablet  TAKE ONE TABLET ON AN EMPTY STOMACH THREE TIMES DAILY 30 MINUTES BEFORE MEALS.       traZODone (DESYREL) 50 MG tablet Take 50 mg by mouth as needed for sleep. Per patient taking 1/4 to 1/2 tablet as needed        No current facility-administered medications for this visit.      REVIEW OF SYSTEMS:  [X]  denotes positive finding, [ ]  denotes negative finding Cardiac   Comments:  Chest pain or chest pressure:      Shortness of breath upon exertion:      Short of breath when lying flat:      Irregular heart rhythm:             Vascular      Pain in calf, thigh, or hip brought on by ambulation:      Pain in feet at night that wakes you up from your sleep:       Blood clot in your veins:      Leg swelling:              Pulmonary      Oxygen at home:      Productive cough:       Wheezing:              Neurologic      Sudden weakness in arms or legs:       Sudden numbness in arms or legs:       Sudden onset of difficulty speaking or slurred speech:      Temporary loss of vision in one eye:       Problems with dizziness:              Gastrointestinal      Blood in stool:       Vomited blood:              Genitourinary      Burning when urinating:       Blood in urine:             Psychiatric      Major depression:              Hematologic      Bleeding problems:      Problems with blood clotting too easily:             Skin      Rashes or ulcers:             Constitutional      Fever  or chills:          PHYSICAL EXAM:    Vitals:    11/01/21 1018  BP: 138/86  Pulse: 81  Resp: 16  Temp: (!) 96 F (35.6 C)  TempSrc: Temporal  SpO2: 97%  Weight: 195 lb (88.5 kg)  Height: 5\' 5"  (1.651 m)      GENERAL: The patient is a well-nourished female, in no acute distress. The vital signs are documented  above. CARDIAC: There is a regular rate and rhythm.  VASCULAR:  Palpable femoral pulses bilaterally Palpable DP pulses bilaterally PULMONARY: No respiratory distress. ABDOMEN: Soft and non-tender.  Midline incision and left sided colostomy incision.  No obvious hernia. MUSCULOSKELETAL: There are no major deformities or cyanosis. NEUROLOGIC: No focal weakness or paresthesias are detected. SKIN: There are no ulcers or rashes noted. PSYCHIATRIC: The patient has a normal affect.   DATA:    MRI reviewed from 09/30/21       Assessment/Plan:   66 year old female with debilitating back, buttock, and neuropathic left leg pain with degenerative disc disease at L4-L5 that presents for L4-L5 OLIF.  She has had a complex history of abdominal surgery including laparoscopic cholecystectomy, hysterectomy, and most importantly ex lap with sigmoid colectomy and subsequent Hartman's colostomy reversal with incisional hernia repair using retrorectus mesh.  I did lay her on her right side in the office and I think in looking at her abdomen and reviewing her imaging we should be well lateral to her mesh to make an oblique approach over her left oblique muscles and this should be a good option.  I discussed this would be more technically challenging and there would be some risk if the mesh extends more lateral then indicated in the op note there would be a small chance we would be unable to get the exposure.  I do think it would be reasonable to proceed.  Risk benefits discussed.  Discussed with muscle-sparing technique through the oblique muscles with her in the right lateral position and moving the peritoneum and left ureter and left psoas and possible iliac vessels to expose the L4-L5 disc space from oblique approach.     Marty Heck, MD [facility] [place] Office: [phone]

## 2021-12-07 NOTE — OR Nursing (Signed)
Dr. Carlis Abbott from Radiology called to confirm that there were no instruments viewed in image sent.  Confirmed with Dr. Rolena Infante over speakerphone.

## 2021-12-08 ENCOUNTER — Encounter (HOSPITAL_COMMUNITY): Payer: Self-pay | Admitting: Orthopedic Surgery

## 2021-12-08 NOTE — Evaluation (Signed)
Occupational Therapy Evaluation and Discharge Patient Details Name: [name] MRN: [id number] DOB: 1956/10/27 Today's Date: 12/08/2021   History of Present Illness Pt is a 66 year old woman admitted on 12/07/21 for OLIF L4-5, abdominal exposure with PLIF and pedicle screws. PMH includes, but not limited to fibromyalgia, endometrial cancer, chronic fatigue, atrial tachycardia, h/o colostomy.   Clinical Impression   All education completed with pt and husband verbalizing and/or demonstrating understanding. No further OT needs.      Recommendations for follow up therapy are one component of a multi-disciplinary discharge planning process, led by the attending physician.  Recommendations may be updated based on patient status, additional functional criteria and insurance authorization.   Follow Up Recommendations  No OT follow up    Assistance Recommended at Discharge None  Patient can return home with the following Assistance with cooking/housework;Assist for transportation;Help with stairs or ramp for entrance    Functional Status Assessment  Patient has had a recent decline in their functional status and demonstrates the ability to make significant improvements in function in a reasonable and predictable amount of time.  Equipment Recommendations  None recommended by OT (pt declines a 3 in 1)    Recommendations for Other Services       Precautions / Restrictions Precautions Precautions: Back Precaution Booklet Issued: Yes (comment) Required Braces or Orthoses: Spinal Brace Spinal Brace: Lumbar corset;Applied in sitting position Restrictions Weight Bearing Restrictions: No      Mobility Bed Mobility Overal bed mobility: Modified Independent             General bed mobility comments: using log roll technique, HOB flat, no rail    Transfers Overall transfer level: Modified independent Equipment used: Rolling walker (2 wheels)               General transfer  comment: cues for hand placement      Balance Overall balance assessment: Needs assistance   Sitting balance-Leahy Scale: Good       Standing balance-Leahy Scale: Fair                             ADL either performed or assessed with clinical judgement   ADL Overall ADL's : Modified independent                                       General ADL Comments: Educated pt in back precautions related to ADL and compensatory strategies, reinforced with written handout. Educated pt and husband in IADLs to avoid.     Vision Baseline Vision/History: 1 Wears glasses Ability to See in Adequate Light: 0 Adequate Patient Visual Report: No change from baseline       Perception     Praxis      Pertinent Vitals/Pain Pain Assessment Pain Assessment: Faces Faces Pain Scale: Hurts a little bit Pain Location: back Pain Descriptors / Indicators: Discomfort Pain Intervention(s): Monitored during session, Repositioned     Hand Dominance Right   Extremity/Trunk Assessment Upper Extremity Assessment Upper Extremity Assessment: Overall WFL for tasks assessed   Lower Extremity Assessment Lower Extremity Assessment: Defer to PT evaluation   Cervical / Trunk Assessment Cervical / Trunk Assessment: Back Surgery   Communication Communication Communication: No difficulties   Cognition Arousal/Alertness: Awake/alert Behavior During Therapy: WFL for tasks assessed/performed Overall Cognitive Status: Within Functional Limits for tasks assessed  General Comments       Exercises     Shoulder Instructions      Home Living Family/patient expects to be discharged to:: Private residence Living Arrangements: Spouse/significant other Available Help at Discharge: Family;Available 24 hours/day Type of Home: House Home Access: Stairs to enter [employer] of Steps: 3 Entrance Stairs-Rails:  Right;Left Home Layout: One level     Bathroom Shower/Tub: Occupational psychologist: Standard     Home Equipment: Conservation officer, nature (2 wheels);Shower seat;Adaptive equipment Adaptive Equipment: Reacher        Prior Functioning/Environment Prior Level of Function : Needs assist               ADLs Comments: assisted for IADL        OT Problem List:        OT Treatment/Interventions:      OT Goals(Current goals can be found in the care plan section)    OT Frequency:      Co-evaluation              AM-PAC OT "6 Clicks" Daily Activity     Outcome Measure Help from another person eating meals?: None Help from another person taking care of personal grooming?: None Help from another person toileting, which includes using toliet, bedpan, or urinal?: None Help from another person bathing (including washing, rinsing, drying)?: None Help from another person to put on and taking off regular upper body clothing?: None Help from another person to put on and taking off regular lower body clothing?: None 6 Click Score: 24   End of Session Equipment Utilized During Treatment: Rolling walker (2 wheels);Gait belt;Back brace  Activity Tolerance: Patient tolerated treatment well Patient left: in bed;with call bell/phone within reach;with family/visitor present  OT Visit Diagnosis: Other abnormalities of gait and mobility (R26.89)                Time: 1914-7829 OT Time Calculation (min): 32 min Charges:  OT General Charges $OT Visit: 1 Visit OT Evaluation $OT Eval Low Complexity: 1 Low OT Treatments $Self Care/Home Management : 8-22 mins  Nestor Lewandowsky, OTR/L Acute Rehabilitation Services Pager: [phone] Office: [phone]  Malka So 12/08/2021, 9:34 AM

## 2021-12-08 NOTE — Progress Notes (Signed)
Patient alert and oriented, voiding adequately, MAE well with no difficulty. Patient has positive Flatus per patient. Incision area cdi with no s/s of infection. Patient discharged home per order. Patient and husband stated understanding of discharge instructions given. Patient has an appointment with Dr. Rolena Infante in 2 weeks.

## 2021-12-08 NOTE — Progress Notes (Addendum)
°  Progress Note    12/08/2021 8:05 AM 1 Day Post-Op  Subjective:  sitting in bed smiling.  Wants to go home  Tm 99.1 now afebrile  Vitals:   12/08/21 0456 12/08/21 0712  BP: (!) 123/56 (!) 117/58  Pulse: 73 74  Resp: 20 16  Temp: 98.4 F (36.9 C) 98.2 F (36.8 C)  SpO2: 93% 99%    Physical Exam: Cardiac:  regular Lungs:  non labored Incisions:  honey comb bandage in place and clean and dry Extremities:  easily palpable DP pulses bilaterally Abdomen:  soft  CBC    Component Value Date/Time   WBC 9.4 12/01/2021 1116   RBC 4.52 12/01/2021 1116   HGB 13.9 12/01/2021 1116   HCT 42.9 12/01/2021 1116   PLT 434 (H) 12/01/2021 1116   MCV 94.9 12/01/2021 1116   MCH 30.8 12/01/2021 1116   MCHC 32.4 12/01/2021 1116   RDW 12.5 12/01/2021 1116   LYMPHSABS 0.5 (L) 10/18/2018 2305   MONOABS 0.6 10/18/2018 2305   EOSABS 0.1 10/18/2018 2305   BASOSABS 0.0 10/18/2018 2305    BMET    Component Value Date/Time   NA 141 12/01/2021 1116   K 4.2 12/01/2021 1116   CL 104 12/01/2021 1116   CO2 30 12/01/2021 1116   GLUCOSE 105 (H) 12/01/2021 1116   BUN 13 12/01/2021 1116   CREATININE 1.17 (H) 12/01/2021 1116   CALCIUM 9.4 12/01/2021 1116   GFRNONAA 51 (L) 12/01/2021 1116   GFRAA >60 04/10/2019 0602    INR    Component Value Date/Time   INR 0.9 02/19/2008 0910     Intake/Output Summary (Last 24 hours) at 12/08/2021 0805 Last data filed at 12/07/2021 1301 Gross per 24 hour  Intake 1500 ml  Output 400 ml  Net 1100 ml     Assessment/Plan:  66 y.o. female is s/p:  Abdominal exposure at L4-L5 via oblique retroperitoneal approach for L4-L5 OLIF  1 Day Post-Op   -pt doing well this morning from vascular standpoint with palpable DP pulses bilaterally -dressing is dry -pt will f/u with Dr. Carlis Abbott as needed    Leontine Locket, PA-C [facility] [phone] 12/08/2021 8:05 AM  I have seen and evaluated the patient. I agree with the PA note as  documented above. POD#1 s/p L4-L5 OLIF.  Appropriate post-op incisional tenderness.  No N/V.  Palpable pedal pulses.  Walking with therapy.  Looks good from my standpoint.   Marty Heck, MD [facility] [place] Office: [phone]

## 2021-12-08 NOTE — Progress Notes (Signed)
° ° °  Subjective: Procedure(s) (LRB): OBLIQUE LUMBAR INTERBODY FUSION 1 LEVEL WITH PERCUTANEOUS SCREWS (OLIF L4-5 WITH POSTERIOR SPINAL FUSION AND PEDICLE SCREWS) (N/A) ABDOMINAL EXPOSURE (N/A) 1 Day Post-Op  Patient reports pain as 2 on 0-10 scale.  Reports decreased leg pain reports incisional back pain   Positive void Negative bowel movement Negative flatus Positive chest pain or shortness of breath  Objective: Vital signs in last 24 hours: Temp:  [97.8 F (36.6 C)-99.1 F (37.3 C)] 98.2 F (36.8 C) (02/09 0712) Pulse Rate:  [69-77] 74 (02/09 0712) Resp:  [12-20] 16 (02/09 0712) BP: (106-143)/(54-78) 117/58 (02/09 0712) SpO2:  [89 %-100 %] 99 % (02/09 0712)  Intake/Output from previous day: 02/08 0701 - 02/09 0700 In: 1500 [I.V.:1500] Out: 400 [Urine:275; Blood:125]  Labs: No results for input(s): WBC, RBC, HCT, PLT in the last 72 hours. No results for input(s): NA, K, CL, CO2, BUN, CREATININE, GLUCOSE, CALCIUM in the last 72 hours. No results for input(s): LABPT, INR in the last 72 hours.  Physical Exam: Neurologically intact ABD soft Dorsiflexion/Plantar flexion intact Incision: dressing C/D/I and no drainage Compartment soft Body mass index is 32.12 kg/m.   Assessment/Plan: Patient stable  Continue mobilization with physical therapy Continue care  Advance diet Up with therapy Plan possible discharge tomorrow.   Patient is doing well after eating and has no abdominal complaints we can consider discharge today. Overall she is doing exceptionally well from the spine standpoint  Her radicular leg pain is resolved and she does not have debilitating incisional/surgical pain.   Melina Schools, MD [facility] [phone]

## 2021-12-08 NOTE — Evaluation (Signed)
Physical Therapy Evaluation Patient Details Name: [name] MRN: [id number] DOB: 04-20-1956 Today's Date: 12/08/2021  History of Present Illness  Pt is a 66 year old woman admitted on 12/07/21 for OLIF L4-5, abdominal exposure with PLIF and pedicle screws. PMH includes, but not limited to fibromyalgia, endometrial cancer, chronic fatigue, atrial tachycardia, h/o colostomy.  Clinical Impression  Patient evaluated by Physical Therapy with no further acute PT needs identified. All education has been completed and the patient has no further questions. Educated pt and husband on proper RW height for adjustement at home; See below for any follow-up Physical Therapy or equipment needs. PT is signing off. Thank you for this referral.        Recommendations for follow up therapy are one component of a multi-disciplinary discharge planning process, led by the attending physician.  Recommendations may be updated based on patient status, additional functional criteria and insurance authorization.  Follow Up Recommendations No PT follow up    Assistance Recommended at Discharge PRN  Patient can return home with the following  Assistance with cooking/housework    Equipment Recommendations None recommended by PT (well-equipped)  Recommendations for Other Services       Functional Status Assessment Patient has had a recent decline in their functional status and demonstrates the ability to make significant improvements in function in a reasonable and predictable amount of time.     Precautions / Restrictions Precautions Precautions: Back Precaution Booklet Issued: Yes (comment) Required Braces or Orthoses: Spinal Brace Spinal Brace: Lumbar corset;Applied in sitting position Restrictions Weight Bearing Restrictions: No      Mobility  Bed Mobility Overal bed mobility: Modified Independent             General bed mobility comments: using log roll technique, HOB flat, no rail     Transfers Overall transfer level: Modified independent Equipment used: Rolling walker (2 wheels)               General transfer comment: cues for hand placement    Ambulation/Gait Ambulation/Gait assistance: Supervision Gait Distance (Feet): 200 Feet Assistive device: Rolling walker (2 wheels) Gait Pattern/deviations: Step-through pattern       General Gait Details: Cues to self-monitor for activity tolerance; Adjusted height of RW for optimal fit  Stairs Stairs: Yes Stairs assistance: Min assist Stair Management: One rail Left, Step to pattern, Forwards Number of Stairs: 3 General stair comments: Demonstrated backwards with RW and forwards with rail and Handheld assist; pt and husband successfully negotiated 3 steps  Wheelchair Mobility    Modified Rankin (Stroke Patients Only)       Balance Overall balance assessment: Needs assistance   Sitting balance-Leahy Scale: Good       Standing balance-Leahy Scale: Fair                               Pertinent Vitals/Pain Pain Assessment Pain Assessment: 0-10 Pain Score: 8  Faces Pain Scale: Hurts a little bit Pain Location: back Pain Descriptors / Indicators: Discomfort Pain Intervention(s): Monitored during session    Home Living Family/patient expects to be discharged to:: Private residence Living Arrangements: Spouse/significant other Available Help at Discharge: Family;Available 24 hours/day Type of Home: House Home Access: Stairs to enter Entrance Stairs-Rails: Psychiatric nurse of Steps: 3   Home Layout: One level Home Equipment: Conservation officer, nature (2 wheels);Shower seat;Adaptive equipment      Prior Function Prior Level of Function : Needs assist  Mobility Comments: Walks with a cane ADLs Comments: assisted for IADL     Hand Dominance   Dominant Hand: Right    Extremity/Trunk Assessment   Upper Extremity Assessment Upper Extremity Assessment:  Defer to OT evaluation    Lower Extremity Assessment Lower Extremity Assessment: Overall WFL for tasks assessed    Cervical / Trunk Assessment Cervical / Trunk Assessment: Back Surgery  Communication   Communication: No difficulties  Cognition Arousal/Alertness: Awake/alert Behavior During Therapy: WFL for tasks assessed/performed Overall Cognitive Status: Within Functional Limits for tasks assessed                                          General Comments General comments (skin integrity, edema, etc.): We discussed car transfers, and best posture/options for positioning for sitting and work stations    Exercises     Assessment/Plan    PT Assessment Patient does not need any further PT services  PT Problem List         PT Treatment Interventions      PT Goals (Current goals can be found in the Care Plan section)  Acute Rehab PT Goals Patient Stated Goal: Home today PT Goal Formulation: All assessment and education complete, DC therapy    Frequency       Co-evaluation               AM-PAC PT "6 Clicks" Mobility  Outcome Measure Help needed turning from your back to your side while in a flat bed without using bedrails?: A Little Help needed moving from lying on your back to sitting on the side of a flat bed without using bedrails?: A Little Help needed moving to and from a bed to a chair (including a wheelchair)?: None Help needed standing up from a chair using your arms (e.g., wheelchair or bedside chair)?: None Help needed to walk in hospital room?: None Help needed climbing 3-5 steps with a railing? : A Little 6 Click Score: 21    End of Session Equipment Utilized During Treatment: Back brace (took time to adjust fit) Activity Tolerance: Patient tolerated treatment well Patient left: Other (comment) (ambulating in hallway with husband) Nurse Communication: Mobility status PT Visit Diagnosis: Other abnormalities of gait and mobility  (R26.89)    Time: 6378-5885 PT Time Calculation (min) (ACUTE ONLY): 39 min   Charges:   PT Evaluation $PT Eval Low Complexity: 1 Low PT Treatments $Gait Training: 23-37 mins        Roney Marion, PT  Acute Rehabilitation Services Pager [phone] Office [phone]   [place] 12/08/2021, 9:58 AM

## 2021-12-09 ENCOUNTER — Telehealth: Payer: Self-pay | Admitting: *Deleted

## 2021-12-09 ENCOUNTER — Encounter (HOSPITAL_COMMUNITY): Payer: Self-pay | Admitting: Orthopedic Surgery

## 2021-12-09 NOTE — Discharge Summary (Signed)
Patient ID: [name] MRN: [id number] DOB/AGE: 1956/03/04 66 y.o.  Admit date: 12/07/2021 Discharge date: 12/08/2021  Admission Diagnoses:  Principal Problem:   S/P lumbar fusion   Discharge Diagnoses:  Principal Problem:   S/P lumbar fusion  status post Procedure(s): OBLIQUE LUMBAR INTERBODY FUSION 1 LEVEL WITH PERCUTANEOUS SCREWS (OLIF L4-5 WITH POSTERIOR SPINAL FUSION AND PEDICLE SCREWS) ABDOMINAL EXPOSURE  Past Medical History:  Diagnosis Date   Asthma    Back pain    Chronic fatigue syndrome    Dr. Sabra Heck   Complication of anesthesia    Cough    /SOB, PFT's nl, improved with Bronchodilation 8/11   Dysrhythmia    Endometrial cancer ([place])    Esophageal spasm    GERD Dr. Sabra Heck, Dr. Wynetta Emery; egd 2006 nl; UGI 3/13 Small HH, moderate GERD, nl motility; seen at [facility] ([place]), egd? neg, sone improvement on sucralfate susp   Fibromyalgia    Foot pain    Dr. Rushie Nyhan   Hashimoto's thyroiditis    High triglycerides    Hypothyroidism    Dr. Lewis Shock 4/14   Leukocytoclastic vasculitis ([place])    Dr. Tonia Brooms   Low HDL (under 40)    Memory loss    Mitral valve prolapse    Paroxysmal atrial tachycardia ([place])    Dr. Marlou Porch   Patent foramen ovale    Pernicious anemia    Dr. Sabra Heck   PONV (postoperative nausea and vomiting)    along time ago had nausea ans vomiting after surgery-25 years ago   Reflux    ? delayed gastric emptying--GES nl 01/2012 (6% at 2hrs)   RLS (restless legs syndrome)    low ferritin Dr. Sabra Heck   Rosacea    Sleep apnea    Uterine carcinoma ([place])    Dr. Polly Cobia    Surgeries: Procedure(s): OBLIQUE LUMBAR INTERBODY FUSION 1 LEVEL WITH PERCUTANEOUS SCREWS (OLIF L4-5 WITH POSTERIOR SPINAL FUSION AND PEDICLE SCREWS) ABDOMINAL EXPOSURE on 12/07/2021   Consultants: Treatment Team:  Marty Heck, MD  Discharged Condition: Improved  Hospital Course: [name] is an 66 y.o. female who was admitted 12/07/2021 for operative treatment of S/P  lumbar fusion. Patient failed conservative treatments (please see the history and physical for the specifics) and had severe unremitting pain that affects sleep, daily activities and work/hobbies. After pre-op clearance, the patient was taken to the operating room on 12/07/2021 and underwent  Procedure(s): OBLIQUE LUMBAR INTERBODY FUSION 1 LEVEL WITH PERCUTANEOUS SCREWS (OLIF L4-5 WITH POSTERIOR SPINAL FUSION AND PEDICLE SCREWS) ABDOMINAL EXPOSURE.    Patient was given perioperative antibiotics:  Anti-infectives (From admission, onward)    Start     Dose/Rate Route Frequency Ordered Stop   12/07/21 2200  doxycycline (PERIOSTAT) tablet 20 mg  Status:  Discontinued        20 mg Oral 2 times daily 12/07/21 1614 12/08/21 1235   12/07/21 1700  hydroxychloroquine (PLAQUENIL) tablet 200 mg  Status:  Discontinued        200 mg Oral 2 times daily with meals 12/07/21 1614 12/08/21 2216   12/07/21 1600  ceFAZolin (ANCEF) IVPB 1 g/50 mL premix        1 g 100 mL/hr over 30 Minutes Intravenous Every 8 hours 12/07/21 1550 12/07/21 2344   12/07/21 0745  ceFAZolin (ANCEF) IVPB 2g/100 mL premix        2 g 200 mL/hr over 30 Minutes Intravenous 30 min pre-op 12/07/21 0656 12/07/21 0907        Patient  was given sequential compression devices and early ambulation to prevent DVT.   Patient benefited maximally from hospital stay and there were no complications. At the time of discharge, the patient was urinating/moving their bowels without difficulty, tolerating a regular diet, pain is controlled with oral pain medications and they have been cleared by PT/OT.   Recent vital signs: Patient Vitals for the past 24 hrs:  BP Temp Temp src Pulse Resp SpO2  12/08/21 1600 114/71 97.7 F (36.5 C) Oral 70 16 99 %     Recent laboratory studies: No results for input(s): WBC, HGB, HCT, PLT, NA, K, CL, CO2, BUN, CREATININE, GLUCOSE, INR, CALCIUM in the last 72 hours.  Invalid input(s): PT, 2   Discharge Medications:    Allergies as of 12/08/2021       Reactions   Aspirin Anaphylaxis   In higher doses   Bee Venom Shortness Of Breath, Itching, Swelling, Hives   Peanut-containing Drug Products Anaphylaxis   Ambien [zolpidem Tartrate] Other (See Comments)   Hallucinations   Tape Itching, Swelling, Rash   Ciprofloxacin Nausea And Vomiting   Latex Rash   Minocycline Rash   Shellfish Allergy Rash        Medication List     STOP taking these medications    aspirin EC 81 MG tablet   Coral Calcium 1000 (390 Ca) MG Tabs   cyclobenzaprine 10 MG tablet Commonly known as: FLEXERIL   gabapentin 300 MG capsule Commonly known as: NEURONTIN   metroNIDAZOLE 0.75 % gel Commonly known as: METROGEL   naproxen sodium 220 MG tablet Commonly known as: ALEVE   Vitamin D3 25 MCG (1000 UT) Caps       TAKE these medications    acetaminophen 500 MG tablet Commonly known as: TYLENOL Take 2 tablets (1,000 mg total) by mouth every 6 (six) hours as needed.   albuterol 108 (90 Base) MCG/ACT inhaler Commonly known as: VENTOLIN HFA Inhale 2 puffs into the lungs every 4 (four) hours as needed for wheezing or shortness of breath.   ARIPiprazole 2 MG tablet Commonly known as: Abilify Take 1 tablet (2 mg total) by mouth daily. What changed: when to take this   atorvastatin 10 MG tablet Commonly known as: LIPITOR TAKE 1 TABLET BY MOUTH  DAILY What changed: when to take this   B-D SYRINGE LUER-LOK 3CC 3 ML Misc Generic drug: Syringe (Disposable) BD Luer-Lok Syringe 3 mL 25 gauge x 1"  Subcutaneous Syringes and Needles to Inject Vitamin B12 Monthly.   buPROPion 150 MG 24 hr tablet Commonly known as: WELLBUTRIN XL Take 450 mg by mouth daily.   cetirizine 10 MG tablet Commonly known as: ZYRTEC Take 10 mg by mouth at bedtime.   cyanocobalamin 1000 MCG/ML injection Commonly known as: (VITAMIN B-12) Inject 1 mL (1,000 mcg total) into the muscle every 30 (thirty) days.   docusate sodium 100 MG  capsule Commonly known as: COLACE Take 100 mg by mouth 2 (two) times daily as needed for mild constipation.   doxycycline 20 MG tablet Commonly known as: PERIOSTAT Take 20 mg by mouth 2 (two) times daily.   DULoxetine 30 MG capsule Commonly known as: CYMBALTA Take 90 mg by mouth at bedtime.   EPINEPHrine 0.3 mg/0.3 mL Soaj injection Commonly known as: EPI-PEN Inject 0.3 mg into the muscle as needed for anaphylaxis.   fexofenadine 180 MG tablet Commonly known as: ALLEGRA Take 180 mg by mouth daily.   FINACEA EX Apply 1 application topically at bedtime.  fluticasone-salmeterol 115-21 MCG/ACT inhaler Commonly known as: ADVAIR HFA Inhale 2 puffs into the lungs 2 (two) times daily.   hydroxychloroquine 200 MG tablet Commonly known as: PLAQUENIL Take 200 mg by mouth 2 (two) times daily with a meal.   ipratropium 0.06 % nasal spray Commonly known as: ATROVENT Place 2 sprays into both nostrils in the morning and at bedtime.   ketotifen 0.025 % ophthalmic solution Commonly known as: ZADITOR Place 1 drop into both eyes in the morning.   levothyroxine 150 MCG tablet Commonly known as: SYNTHROID Take 150 mcg by mouth daily before breakfast.   methocarbamol 500 MG tablet Commonly known as: Robaxin Take 1 tablet (500 mg total) by mouth every 8 (eight) hours as needed for up to 5 days for muscle spasms.   metoprolol succinate 25 MG 24 hr tablet Commonly known as: TOPROL-XL TAKE 1 TABLET BY MOUTH ONCE DAILY   Needles & Syringes Misc Subcutaneous Syringes and Needles to Inject Vitamin B12 Monthly.   nitroGLYCERIN 0.4 MG SL tablet Commonly known as: NITROSTAT Place 1 tablet (0.4 mg total) under the tongue every 5 (five) minutes as needed for chest pain.   ondansetron 4 MG tablet Commonly known as: Zofran Take 1 tablet (4 mg total) by mouth every 8 (eight) hours as needed for nausea or vomiting.   oxyCODONE-acetaminophen 10-325 MG tablet Commonly known as: Percocet Take  1 tablet by mouth every 6 (six) hours as needed for up to 5 days for pain.   pantoprazole 40 MG tablet Commonly known as: PROTONIX Take 80 mg by mouth 2 (two) times daily.   polyethylene glycol 17 g packet Commonly known as: MIRALAX / GLYCOLAX Take 17 g by mouth daily as needed (constipation.).   pramipexole 0.125 MG tablet Commonly known as: MIRAPEX Take 0.375 mg by mouth at bedtime as needed (restless legs syndrome).   pregabalin 75 MG capsule Commonly known as: LYRICA Take 75 mg by mouth 2 (two) times daily.   PROBIOTIC PO Take 1 capsule by mouth in the morning.   vitamin C 1000 MG tablet Take 1,000 mg by mouth in the morning.   Xolair 150 MG injection Generic drug: omalizumab 150 mg every 30 (thirty) days. On the 13th of the month        Diagnostic Studies: DG Lumbar Spine 2-3 Views  Result Date: 12/07/2021 CLINICAL DATA:  Surgical posterior fusion of L4-5. EXAM: LUMBAR SPINE - 2-3 VIEW; DG C-ARM 1-60 MIN-NO REPORT Radiation exposure index: 257.22 mGy. COMPARISON:  None. FINDINGS: Three intraoperative fluoroscopic images were obtained of the lower lumbar spine. These demonstrate the patient be status post surgical posterior fusion of L4-5 with bilateral intrapedicular screw placement and interbody fusion. IMPRESSION: Fluoroscopic guidance provided during surgical posterior fusion of L4-5. Electronically Signed   By: Marijo Conception M.D.   On: 12/07/2021 12:47   DG C-Arm 1-60 Min-No Report  Result Date: 12/07/2021 Fluoroscopy was utilized by the requesting physician.  No radiographic interpretation.   DG C-Arm 1-60 Min-No Report  Result Date: 12/07/2021 Fluoroscopy was utilized by the requesting physician.  No radiographic interpretation.   DG C-Arm 1-60 Min-No Report  Result Date: 12/07/2021 Fluoroscopy was utilized by the requesting physician.  No radiographic interpretation.   DG C-Arm 1-60 Min-No Report  Result Date: 12/07/2021 Fluoroscopy was utilized by the  requesting physician.  No radiographic interpretation.   DG OR LOCAL ABDOMEN  Result Date: 12/07/2021 CLINICAL DATA:  Back surgery.  Rule out retained surgical instrument EXAM: OR LOCAL  ABDOMEN COMPARISON:  06/10/2018 FINDINGS: Metal interbody spacer or disc prosthesis is present at the second lowest disc space, considered L5-S1. Multiple staples are seen overlying the abdomen. Approximately 8 staples are seen overlying the right abdomen. Several staples are seen overlying the lower lumbar spine. Two staples are seen overlying the left pelvis and SI joint. There are surgical clips in the gallbladder fossa. No curved needle or retained instrument identified. IMPRESSION: Metal prosthesis in the L4 disc space in good position. Numerous staples are seen overlying the abdomen bilaterally which were not seen previously. These appear to be skin staples. There are also staples in the gallbladder fossa No retained instrument. These results were called by telephone at the time of interpretation on 12/07/2021 at 11:22 am to provider [facility] , who verbally acknowledged these results. Electronically Signed   By: Franchot Gallo M.D.   On: 12/07/2021 11:23    Discharge Instructions     Incentive spirometry RT   Complete by: As directed         Follow-up Information     Melina Schools, MD. Schedule an appointment as soon as possible for a visit in 2 day(s).   Specialty: Orthopedic Surgery Why: If symptoms worsen, For suture removal, For wound re-check Contact information: [address] [place] [address] [phone]                 Discharge Plan:  discharge to home  Disposition: stable    Signed: Charlyne Petrin for [facility] PA-C [facility] [phone] 12/09/2021, 1:33 PM

## 2021-12-09 NOTE — Telephone Encounter (Signed)
Transition Care Management Follow-up Telephone Call Date of discharge and from where: 12/08/2021 [place] How have you been since you were released from the hospital? [facility] Any questions or concerns? No  Items Reviewed: Did the pt receive and understand the discharge instructions provided? Yes  Medications obtained and verified? Yes  Other? No  Any new allergies since your discharge? No  Dietary orders reviewed? Yes Do you have support at home? Yes   Home Care and Equipment/Supplies: Were home health services ordered? No patient stated that she has had therapy in the past and know what exercises to do If so, what is the name of the agency? na  Has the agency set up a time to come to the patient's home? not applicable Were any new equipment or medical supplies ordered?  No What is the name of the medical supply agency? na Were you able to get the supplies/equipment? not applicable Do you have any questions related to the use of the equipment or supplies? No  Functional Questionnaire: (I = Independent and D = Dependent) ADLs: I with assistance   Bathing/Dressing- I  Meal Prep- I  Eating- I  Maintaining continence- I  Transferring/Ambulation- I with assistance  Managing Meds- I  Follow up appointments reviewed:  PCP Hospital f/u appt confirmed? Yes  Scheduled to see Dr. Sabra Heck on 12/21/2020 @ 10. [facility] f/u appt confirmed? Yes  Scheduled to see Dr. Rolena Infante, Orthopaedic on 12/20/2021  Are transportation arrangements needed? No  If their condition worsens, is the pt aware to call PCP or go to the Emergency Dept.? Yes Was the patient provided with contact information for the PCP's office or ED? Yes Was to pt encouraged to call back with questions or concerns? Yes

## 2021-12-13 DIAGNOSIS — Z20822 Contact with and (suspected) exposure to covid-19: Secondary | ICD-10-CM | POA: Diagnosis not present

## 2021-12-14 MED FILL — Heparin Sodium (Porcine) Inj 1000 Unit/ML: INTRAMUSCULAR | Qty: 30 | Status: AC

## 2021-12-14 MED FILL — Sodium Chloride IV Soln 0.9%: INTRAVENOUS | Qty: 1000 | Status: AC

## 2021-12-21 ENCOUNTER — Encounter: Payer: Medicare Other | Admitting: Family Medicine

## 2021-12-22 DIAGNOSIS — L501 Idiopathic urticaria: Secondary | ICD-10-CM | POA: Diagnosis not present

## 2021-12-27 ENCOUNTER — Ambulatory Visit (INDEPENDENT_AMBULATORY_CARE_PROVIDER_SITE_OTHER): Payer: Medicare Other | Admitting: Family Medicine

## 2021-12-27 ENCOUNTER — Other Ambulatory Visit: Payer: Self-pay

## 2021-12-27 ENCOUNTER — Encounter: Payer: Self-pay | Admitting: Family Medicine

## 2021-12-27 VITALS — BP 136/84 | HR 74 | Temp 96.8°F | Ht 65.0 in | Wt 198.0 lb

## 2021-12-27 DIAGNOSIS — G9332 Myalgic encephalomyelitis/chronic fatigue syndrome: Secondary | ICD-10-CM | POA: Diagnosis not present

## 2021-12-27 DIAGNOSIS — Z981 Arthrodesis status: Secondary | ICD-10-CM

## 2021-12-27 DIAGNOSIS — M797 Fibromyalgia: Secondary | ICD-10-CM

## 2021-12-27 NOTE — Progress Notes (Signed)
Provider:  Alain Honey, MD  Careteam: Patient Care Team: Wardell Honour, MD as PCP - General (Family Medicine) Jerline Pain, MD as PCP - Cardiology (Cardiology) Warren Danes, PA-C as Physician Assistant (Dermatology) Tiajuana Amass, MD as Referring Physician (Allergy and Immunology) Ronald Lobo, MD as Consulting Physician (Gastroenterology) Ralene Ok, MD as Consulting Physician (General Surgery) Melina Schools, MD as Consulting Physician (Orthopedic Surgery) Baxter Kail, MD (Internal Medicine) Regal, Tamala Fothergill, DPM as Consulting Physician (Podiatry) Jacelyn Pi, MD as Referring Physician (Endocrinology) Sanjuana Kava, MD as Referring Physician (Obstetrics and Gynecology) Fonnie Mu, OD (Optometry) Rana Snare, MD (Inactive) (Urology) Blima Dessert, MD as Referring Physician (Rheumatology)  PLACE OF SERVICE:  [place]  Advanced Directive information    Allergies  Allergen Reactions   Aspirin Anaphylaxis    In higher doses   Bee Venom Shortness Of Breath, Itching, Swelling and Hives   Peanut-Containing Drug Products Anaphylaxis   Ambien [Zolpidem Tartrate] Other (See Comments)    Hallucinations   Tape Itching, Swelling and Rash   Ciprofloxacin Nausea And Vomiting   Latex Rash   Minocycline Rash   Shellfish Allergy Rash    Chief Complaint  Patient presents with   Hospitalization Follow-up    Patient presents today for a hospital follow-up. She was admitted into [facility] hospital on 12/07/21 to 12/08/21 for S/P lumbar fusion.     HPI: Patient is a 66 y.o. female patient had lumbar fusion February 8.  Almost immediately pain in her leg resolved but she continues to have some surgical wound pain.  She is using oxycodone 5 mg.  From her fibromyalgia/ chronic fatigue she is doing better especially since Abilify was started some months ago. After the surgery both she and husband contracted COVID but are doing okay now.  They had been  immunized previously.  Review of Systems:  Review of Systems  Constitutional:  Positive for malaise/fatigue.  Musculoskeletal:  Positive for back pain.  All other systems reviewed and are negative.  Past Medical History:  Diagnosis Date   Asthma    Back pain    Chronic fatigue syndrome    Dr. Sabra Heck   Complication of anesthesia    Cough    /SOB, PFT's nl, improved with Bronchodilation 8/11   Dysrhythmia    Endometrial cancer ([place])    Esophageal spasm    GERD Dr. Sabra Heck, Dr. Wynetta Emery; egd 2006 nl; UGI 3/13 Small HH, moderate GERD, nl motility; seen at [facility] ([place]), egd? neg, sone improvement on sucralfate susp   Fibromyalgia    Foot pain    Dr. Rushie Nyhan   Hashimoto's thyroiditis    High triglycerides    Hypothyroidism    Dr. Lewis Shock 4/14   Leukocytoclastic vasculitis ([place])    Dr. Tonia Brooms   Low HDL (under 40)    Memory loss    Mitral valve prolapse    Paroxysmal atrial tachycardia ([place])    Dr. Marlou Porch   Patent foramen ovale    Pernicious anemia    Dr. Sabra Heck   PONV (postoperative nausea and vomiting)    along time ago had nausea ans vomiting after surgery-25 years ago   Reflux    ? delayed gastric emptying--GES nl 01/2012 (6% at 2hrs)   RLS (restless legs syndrome)    low ferritin Dr. Sabra Heck   Rosacea    Sleep apnea    Uterine carcinoma (HCC)    Dr. Polly Cobia   Past Surgical History:  Procedure Laterality Date  ABDOMINAL EXPOSURE N/A 12/07/2021   Procedure: ABDOMINAL EXPOSURE;  Surgeon: Marty Heck, MD;  Location: [place];  Service: Vascular;  Laterality: N/A;   ABDOMINAL HYSTERECTOMY     bladder tack     x 2   BUNIONECTOMY     CHOLECYSTECTOMY  2009   COLONOSCOPY     scr, 12/2008 ([place]) nl   COLOSTOMY Left 06/05/2018   Procedure: COLOSTOMY;  Surgeon: Ralene Ok, MD;  Location: [place];  Service: General;  Laterality: Left;   COLOSTOMY REVERSAL N/A 10/16/2018   Procedure: HARTMANN'S COLOSTOMY REVERSAL, RIGID PROCTOSCOPY;  Surgeon: Ralene Ok, MD;   Location: [facility];  Service: General;  Laterality: N/A;   HAMMER TOE SURGERY     HYSTERECTOMY ABDOMINAL WITH SALPINGO-OOPHORECTOMY  2008   [place]  04/09/2019   x2   [place] N/A 04/09/2019   Procedure: INCISIONAL HERNIA REPAIR WITH MESH;  Surgeon: Ralene Ok, MD;  Location: [place];  Service: General;  Laterality: N/A;   LAPAROSCOPY N/A 10/16/2018   Procedure: LAPAROSCOPY DIAGNOSTIC WITH LYSIS OF ADHESIONS;  Surgeon: Ralene Ok, MD;  Location: [facility];  Service: General;  Laterality: N/A;   LUNG BIOPSY     L Lower lung pulm nodules, largest 4.60mm, low risk, repeat CT in 1 yr now following with pulm [facility]; 9/13 Stable tiny lung nodules, no f/u needed   LYMPH NODE DISSECTION  2008   LYSIS OF ADHESION N/A 04/09/2019   Procedure: OPEN LYSIS OF ADHESION;  Surgeon: Ralene Ok, MD;  Location: [place];  Service: General;  Laterality: N/A;   METATARSAL OSTEOTOMY     NECK SURGERY     x 2   OBLIQUE LUMBAR INTERBODY FUSION 1 LEVEL WITH PERCUTANEOUS SCREWS N/A 12/07/2021   Procedure: OBLIQUE LUMBAR INTERBODY FUSION 1 LEVEL WITH PERCUTANEOUS SCREWS (OLIF L4-5 WITH POSTERIOR SPINAL FUSION AND PEDICLE SCREWS);  Surgeon: Melina Schools, MD;  Location: [place];  Service: Orthopedics;  Laterality: N/A;  4 HRS DR. CLARK TO DO APPROACH LEFT TAP BLOCK WITH EXPAREL 3 C-BED   PARTIAL COLECTOMY N/A 06/05/2018   Procedure: EXPLORATORY LAPAROTOMY AND PARTIAL COLECTOMY;  Surgeon: Ralene Ok, MD;  Location: [place];  Service: General;  Laterality: N/A;   TONSILLECTOMY     US ECHOCARDIOGRAPHY     with Nuclear test, Dr. Marlou Porch, Low risk   VESICOVAGINAL FISTULA CLOSURE W/ TAH     Social History:   reports that she has never smoked. She has never used smokeless tobacco. She reports current alcohol use. She reports that she does not use drugs.  Family History  Problem Relation Age of Onset   Hashimoto's thyroiditis Mother    High blood pressure Mother    Stroke  Mother    Other Mother        Borderline DM   Alzheimer's disease Mother    Ulcerative colitis Father    Meniere's disease Father    Hearing loss Father    Stroke Father    Stroke Sister     Medications: Patient's Medications  New Prescriptions   No medications on file  Previous Medications   ACETAMINOPHEN (TYLENOL) 500 MG TABLET    Take 2 tablets (1,000 mg total) by mouth every 6 (six) hours as needed.   ALBUTEROL (PROVENTIL HFA;VENTOLIN HFA) 108 (90 BASE) MCG/ACT INHALER    Inhale 2 puffs into the lungs every 4 (four) hours as needed for wheezing or shortness of breath.   ARIPIPRAZOLE (ABILIFY) 2 MG TABLET  Take 1 tablet (2 mg total) by mouth daily.   ASCORBIC ACID (VITAMIN C) 1000 MG TABLET    Take 1,000 mg by mouth in the morning.   ATORVASTATIN (LIPITOR) 10 MG TABLET    TAKE 1 TABLET BY MOUTH  DAILY   AZELAIC ACID (FINACEA EX)    Apply 1 application topically at bedtime.   BUPROPION (WELLBUTRIN XL) 150 MG 24 HR TABLET    Take 450 mg by mouth daily.    CETIRIZINE (ZYRTEC) 10 MG TABLET    Take 10 mg by mouth at bedtime.   CYANOCOBALAMIN (,VITAMIN B-12,) 1000 MCG/ML INJECTION    Inject 1 mL (1,000 mcg total) into the muscle every 30 (thirty) days.   DOCUSATE SODIUM (COLACE) 100 MG CAPSULE    Take 100 mg by mouth 2 (two) times daily as needed for mild constipation.   DOXYCYCLINE (PERIOSTAT) 20 MG TABLET    Take 20 mg by mouth 2 (two) times daily.   DULOXETINE (CYMBALTA) 30 MG CAPSULE    Take 90 mg by mouth at bedtime.    EPINEPHRINE 0.3 MG/0.3 ML IJ SOAJ INJECTION    Inject 0.3 mg into the muscle as needed for anaphylaxis.    FEXOFENADINE (ALLEGRA) 180 MG TABLET    Take 180 mg by mouth daily.   FLUTICASONE-SALMETEROL (ADVAIR HFA) 115-21 MCG/ACT INHALER    Inhale 2 puffs into the lungs 2 (two) times daily.   HYDROXYCHLOROQUINE (PLAQUENIL) 200 MG TABLET    Take 200 mg by mouth 2 (two) times daily with a meal.   IPRATROPIUM (ATROVENT) 0.06 % NASAL SPRAY    Place 2 sprays into both  nostrils in the morning and at bedtime.   KETOTIFEN (ZADITOR) 0.025 % OPHTHALMIC SOLUTION    Place 1 drop into both eyes in the morning.   LEVOTHYROXINE (SYNTHROID) 150 MCG TABLET    Take 150 mcg by mouth daily before breakfast.   METOPROLOL SUCCINATE (TOPROL-XL) 25 MG 24 HR TABLET    TAKE 1 TABLET BY MOUTH ONCE DAILY   NEEDLES & SYRINGES MISC    Subcutaneous Syringes and Needles to Inject Vitamin B12 Monthly.   NITROGLYCERIN (NITROSTAT) 0.4 MG SL TABLET    Place 1 tablet (0.4 mg total) under the tongue every 5 (five) minutes as needed for chest pain.   OMALIZUMAB (XOLAIR) 150 MG INJECTION    150 mg every 30 (thirty) days. On the 13th of the month   ONDANSETRON (ZOFRAN) 4 MG TABLET    Take 1 tablet (4 mg total) by mouth every 8 (eight) hours as needed for nausea or vomiting.   OXYCODONE-ACETAMINOPHEN (PERCOCET/ROXICET) 5-325 MG TABLET    Take 1 tablet by mouth daily as needed.   PANTOPRAZOLE (PROTONIX) 40 MG TABLET    Take 80 mg by mouth 2 (two) times daily.   POLYETHYLENE GLYCOL (MIRALAX / GLYCOLAX) 17 G PACKET    Take 17 g by mouth daily as needed (constipation.).   PRAMIPEXOLE (MIRAPEX) 0.125 MG TABLET    Take 0.375 mg by mouth at bedtime as needed (restless legs syndrome).   PREDNISONE (DELTASONE) 10 MG TABLET    prednisone 10 mg tablet  Take 4 tablets (40 mg total) by mouth daily for 5 days, THEN 3 tablets (30 mg total) daily for 5 days, THEN 2 tablets (20 mg total) daily for 5 days, THEN 1 tablet (10 mg total) daily for 5 days.   PREGABALIN (LYRICA) 75 MG CAPSULE    Take 75 mg by mouth 2 (two)  times daily.   PROBIOTIC PRODUCT (PROBIOTIC PO)    Take 1 capsule by mouth in the morning.   SYRINGE, DISPOSABLE, (B-D SYRINGE LUER-LOK 3CC) 3 ML MISC    BD Luer-Lok Syringe 3 mL 25 gauge x 1"  Subcutaneous Syringes and Needles to Inject Vitamin B12 Monthly.  Modified Medications   No medications on file  Discontinued Medications   No medications on file    Physical Exam:  Vitals:   12/27/21  1352  Weight: 198 lb (89.8 kg)  Height: 5\' 5"  (1.651 m)   Body mass index is 32.95 kg/m. Wt Readings from Last 3 Encounters:  12/27/21 198 lb (89.8 kg)  12/07/21 193 lb (87.5 kg)  12/01/21 204 lb 1.6 oz (92.6 kg)    Physical Exam Vitals and nursing note reviewed.  Constitutional:      Appearance: Normal appearance.  Cardiovascular:     Rate and Rhythm: Normal rate and regular rhythm.  Pulmonary:     Effort: Pulmonary effort is normal.     Breath sounds: Normal breath sounds.  Musculoskeletal:     Comments: Wearing a brace to keep her back straight.  Only rehab to this point is walking. Checking reflexes in lower extremities there is a decrease right ankle jerk compared to the left.  Questionable significant  Neurological:     General: No focal deficit present.     Mental Status: She is alert and oriented to person, place, and time.    Labs reviewed: Basic Metabolic Panel: Recent Labs    12/01/21 1116  NA 141  K 4.2  CL 104  CO2 30  GLUCOSE 105*  BUN 13  CREATININE 1.17*  CALCIUM 9.4   Liver Function Tests: No results for input(s): AST, ALT, ALKPHOS, BILITOT, PROT, ALBUMIN in the last 8760 hours. No results for input(s): LIPASE, AMYLASE in the last 8760 hours. No results for input(s): AMMONIA in the last 8760 hours. CBC: Recent Labs    12/01/21 1116  WBC 9.4  HGB 13.9  HCT 42.9  MCV 94.9  PLT 434*   Lipid Panel: No results for input(s): CHOL, HDL, LDLCALC, TRIG, CHOLHDL, LDLDIRECT in the last 8760 hours. TSH: No results for input(s): TSH in the last 8760 hours. A1C: Lab Results  Component Value Date   HGBA1C 5.6 10/14/2018     Assessment/Plan  1. Chronic fatigue syndrome Patient is doing much better.  She is somewhat deconditioned after the surgery but she is trying to walk some.  I think PT will help get her moving even more  2. S/P lumbar fusion Back pain has already improved  3. Fibromyalgia She is on a good combination of medicines which  include antidepressants Abilify, Cymbalta and Lyrica   Alain Honey, MD [place] [phone]

## 2022-01-02 DIAGNOSIS — Z20822 Contact with and (suspected) exposure to covid-19: Secondary | ICD-10-CM | POA: Diagnosis not present

## 2022-01-19 DIAGNOSIS — M5416 Radiculopathy, lumbar region: Secondary | ICD-10-CM | POA: Diagnosis not present

## 2022-01-19 DIAGNOSIS — M5451 Vertebrogenic low back pain: Secondary | ICD-10-CM | POA: Diagnosis not present

## 2022-01-20 ENCOUNTER — Other Ambulatory Visit: Payer: Self-pay | Admitting: Family Medicine

## 2022-01-20 DIAGNOSIS — F339 Major depressive disorder, recurrent, unspecified: Secondary | ICD-10-CM

## 2022-01-20 DIAGNOSIS — Z4889 Encounter for other specified surgical aftercare: Secondary | ICD-10-CM | POA: Diagnosis not present

## 2022-01-20 DIAGNOSIS — L501 Idiopathic urticaria: Secondary | ICD-10-CM | POA: Diagnosis not present

## 2022-01-20 DIAGNOSIS — M4316 Spondylolisthesis, lumbar region: Secondary | ICD-10-CM | POA: Diagnosis not present

## 2022-01-31 DIAGNOSIS — M5451 Vertebrogenic low back pain: Secondary | ICD-10-CM | POA: Diagnosis not present

## 2022-01-31 DIAGNOSIS — Z20822 Contact with and (suspected) exposure to covid-19: Secondary | ICD-10-CM | POA: Diagnosis not present

## 2022-01-31 DIAGNOSIS — M5416 Radiculopathy, lumbar region: Secondary | ICD-10-CM | POA: Diagnosis not present

## 2022-02-03 ENCOUNTER — Ambulatory Visit: Admit: 2022-02-03 | Discharge: 2022-02-04 | Payer: MEDICARE

## 2022-02-03 DIAGNOSIS — L509 Urticaria, unspecified: Principal | ICD-10-CM

## 2022-02-03 DIAGNOSIS — L958 Other vasculitis limited to the skin: Secondary | ICD-10-CM | POA: Diagnosis not present

## 2022-02-03 MED ORDER — PREDNISONE 10 MG TABLET
ORAL_TABLET | Freq: Every day | ORAL | 0 refills | 65 days | Status: CP
Start: 2022-02-03 — End: ?

## 2022-02-06 DIAGNOSIS — Z20822 Contact with and (suspected) exposure to covid-19: Secondary | ICD-10-CM | POA: Diagnosis not present

## 2022-02-07 DIAGNOSIS — M5451 Vertebrogenic low back pain: Secondary | ICD-10-CM | POA: Diagnosis not present

## 2022-02-07 DIAGNOSIS — M5416 Radiculopathy, lumbar region: Secondary | ICD-10-CM | POA: Diagnosis not present

## 2022-02-08 ENCOUNTER — Telehealth: Payer: Self-pay | Admitting: Cardiology

## 2022-02-08 NOTE — Telephone Encounter (Signed)
Outreach made to Pt in regards to scheduling appt for chest pain. ? ?See mychart message for Pt's symptoms. ? ?Discussed with Pt if she wanted to see Dr. Marlou Porch specifically?  Or if she felt her symptoms warranted a sooner appt. ? ?She asked what Dr. Marlou Porch had available.  There was no availability in the coming weeks, so she requested to see DOD. ? ?Pt scheduled to see Dr. Ali Lowe 02/09/2022 to evaluate. ? ?Pt thanked nurse for follow up. ?

## 2022-02-08 NOTE — Telephone Encounter (Signed)
Pt sent this via mychart to the scheduling pool:   ? ?Part 2: ? ?I am not experiencing other symptoms such as those you described. I have tried nitroglycerin and found some relief.  ? ?I am not having chest pain at the moment; it comes and goes throughout the day. I don?t notice any palpitations accompanying the pain. It started about 2 weeks ago. It has been awhile since I have seen Dr. Marlou Porch and I thought I?d rather be safe than sorry. My preferred time to see him would be 11:00am to 4:00 in the afternoon, but I can be flexible if necessary. Thank you for getting in touch so quickly.  ? ? ? ? ?Good Morning Lesleyann,   ?Can you tell me a little more about your chest pain?   ? ?1. Are you having CP right now?  ? ?2. Are you experiencing any other symptoms (ex. SOB, nausea, vomiting, sweating)?  ? ?3. How long have you been experiencing CP?  ? ?4. Is your CP continuous or coming and going?  ? ?5. Have you taken Nitroglycerin?  ??  ? ? ?- ? ?Appointment Request From: [name] ? ?With Provider: Candee Furbish, MD [place] ? ?Preferred Date Range: 02/09/2022 - 05/29/2022 ? ?Preferred Times: Monday Afternoon, Tuesday Afternoon, Wednesday Afternoon, Thursday Afternoon, Friday Afternoon ? ?Reason for visit: Office Visit ? ?Comments: ?I am experiencing some chest discomfort (heavy feeling as occasional sharp, pinching sensation) that I would like to have Dr. Marlou Porch check out. Thank you.  ?

## 2022-02-09 ENCOUNTER — Encounter: Payer: Self-pay | Admitting: Internal Medicine

## 2022-02-09 ENCOUNTER — Encounter: Payer: Self-pay | Admitting: *Deleted

## 2022-02-09 ENCOUNTER — Ambulatory Visit (INDEPENDENT_AMBULATORY_CARE_PROVIDER_SITE_OTHER): Payer: Medicare Other | Admitting: Internal Medicine

## 2022-02-09 VITALS — BP 126/74 | HR 82 | Ht 65.0 in | Wt 199.8 lb

## 2022-02-09 DIAGNOSIS — I471 Supraventricular tachycardia: Secondary | ICD-10-CM | POA: Diagnosis not present

## 2022-02-09 DIAGNOSIS — E7800 Pure hypercholesterolemia, unspecified: Secondary | ICD-10-CM

## 2022-02-09 DIAGNOSIS — R072 Precordial pain: Secondary | ICD-10-CM | POA: Diagnosis not present

## 2022-02-09 DIAGNOSIS — I700 Atherosclerosis of aorta: Secondary | ICD-10-CM

## 2022-02-09 DIAGNOSIS — Q2112 Patent foramen ovale: Secondary | ICD-10-CM | POA: Diagnosis not present

## 2022-02-09 MED ORDER — ASPIRIN EC 81 MG PO TBEC: 81.0000 mg | DELAYED_RELEASE_TABLET | Freq: Every day | ORAL | 3 refills | Status: DC

## 2022-02-09 MED ORDER — NITROGLYCERIN 0.4 MG SL SUBL: 0.4000 mg | SUBLINGUAL_TABLET | SUBLINGUAL | 3 refills | Status: DC | PRN

## 2022-02-09 MED ORDER — NITROGLYCERIN 0.4 MG SL SUBL: 0.4000 mg | SUBLINGUAL_TABLET | SUBLINGUAL | 3 refills | Status: AC | PRN

## 2022-02-09 NOTE — Patient Instructions (Signed)
Medication Instructions:  ?No changes ?*If you need a refill on your cardiac medications before your next appointment, please call your pharmacy* ? ? ?Lab Work: ?none ? ?Testing/Procedures: ?Your physician has requested that you have a lexiscan myoview. For further information please visit [email or web address]. Please follow instruction sheet, as given. ? ? ?Follow-Up: ?At [facility], you and your health needs are our priority.  As part of our continuing mission to provide you with exceptional heart care, we have created designated Provider Care Teams.  These Care Teams include your primary Cardiologist (physician) and Advanced Practice Providers (APPs -  Physician Assistants and Nurse Practitioners) who all work together to provide you with the care you need, when you need it. ? ? ?Your next appointment:   ?3 month(s) ? ?The format for your next appointment:   ?In Person ? ?Provider:   ?Candee Furbish, MD  or Advanced Practice Provider  ? ? ?Important Information About Sugar ? ? ? ? ?  ?

## 2022-02-09 NOTE — Progress Notes (Signed)
?Cardiology Office Note:   ? ?Date:  02/09/2022  ? ?ID:  [name], DOB [birth year], MRN [id number] ? ?PCP:  Wardell Honour, MD  ? ?[facility] Providers ?Cardiologist:  Lenna Sciara, MD ?Referring MD: Wardell Honour, MD  ? ?Chief Complaint/Reason for Referral: Chest pain ? ?ASSESSMENT:   ? ?1. Precordial pain   ?2. PFO (patent foramen ovale)   ?3. Paroxysmal atrial tachycardia ([place])   ?4. Pure hypercholesterolemia   ?5. Aortic atherosclerosis ([place])   ? ? ?PLAN:   ? ?In order of problems listed above: ? Will obtain Lexiscan stress test to evaluate for high risk ischemia.  We will arrange follow-up with Dr. Marlou Porch or APS in 3 months.  I did speak with the patient that if her stress test is reassuring that she should have a GI work-up potentially for esophageal spasm. ?On prophylactic aspirin due to transcranial Doppler results. ?Continue metoprolol. ?Continue statin, with aortic atherosclerosis seen on CT goal LDL is less than 70. ?Continue aspirin and statin with aggressive blood pressure control. ? ? ?     ? ?Shared Decision Making/Informed Consent ?The risks [chest pain, shortness of breath, cardiac arrhythmias, dizziness, blood pressure fluctuations, myocardial infarction, stroke/transient ischemic attack, nausea, vomiting, allergic reaction, radiation exposure, metallic taste sensation and life-threatening complications (estimated to be 1 in 10,000)], benefits (risk stratification, diagnosing coronary artery disease, treatment guidance) and alternatives of a nuclear stress test were discussed in detail with [name] and she agrees to proceed.  ? ?Dispo:  No follow-ups on file.  ? ?  ? ?Medication Adjustments/Labs and Tests Ordered: ?Current medicines are reviewed at length with the patient today.  Concerns regarding medicines are outlined above. ? ?The following changes have been made:  no change  ? ?Labs/tests ordered: ?Orders Placed This Encounter  ?Procedures  ? MYOCARDIAL PERFUSION IMAGING  ? EKG  12-Lead  ? ? ?Medication Changes: ?Meds ordered this encounter  ?Medications  ? nitroGLYCERIN (NITROSTAT) 0.4 MG SL tablet  ?  Sig: Place 1 tablet (0.4 mg total) under the tongue every 5 (five) minutes as needed for chest pain.  ?  Dispense:  25 tablet  ?  Refill:  3  ? aspirin EC 81 MG tablet  ?  Sig: Take 1 tablet (81 mg total) by mouth daily. Swallow whole.  ?  Dispense:  90 tablet  ?  Refill:  3  ?  No allergy to 81 mg, only to allergic to 325 mg per patient  ? ? ? ?Current medicines are reviewed at length with the patient today.  The patient does not have concerns regarding medicines. ? ? ?History of Present Illness:   ? ?FOCUSED PROBLEM LIST:   ?1.  PFO on prophylactic aspirin ?2.  Paroxysmal atrial tachycardia ?3.  Hyperlipidemia ?4.  Obstructive sleep apnea ?5.  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  Aortic calcification on CT scan 2020 ? ?The patient is a 66 y.o. female with the indicated medical history here for recommendations regarding chest pain.  I am seeing the patient as the doctor of the day for Dr. Candee Furbish who is her primary cardiologist.  The patient called the office he yesterday with symptoms of chest pain.  Apparently they come and go throughout the day.  She did try nitroglycerin and did obtain relief. ? ?She tells me on occasion she will get a chest discomfort.  It feels like a pressure with some tingling tingling features.  This typically happens at rest..  Does not seem to be any exacerbating factors.  She tells me nitroglycerin seems to help the pain.  She does not seem to get the pain when she exerts herself.  Of note she did have back surgery recently which was uncomplicated.  She denies any syncope, palpitations, paroxysmal nocturnal dyspnea, orthopnea, severe bleeding or bruising, or edema.  She has required no emergency room visits or hospitalizations. ? ?   ?Current Medications: ?Current Meds  ?Medication Sig  ? aspirin EC 81 MG tablet Take 1 tablet (81 mg total) by  mouth daily. Swallow whole.  ?  ? ?Allergies:    ?Aspirin, Bee venom, Peanut-containing drug products, Ambien [zolpidem tartrate], Tape, Ciprofloxacin, Latex, Minocycline, and Shellfish allergy  ? ?Social History:   ?Social History  ? ?Tobacco Use  ? Smoking status: Never  ? Smokeless tobacco: Never  ?Vaping Use  ? Vaping Use: Never used  ?Substance Use Topics  ? Alcohol use: Yes  ?  Comment: maybe a glass of wine/month  ? Drug use: Never  ?  ? ?Family Hx: ?Family History  ?Problem Relation Age of Onset  ? Hashimoto's thyroiditis Mother   ? High blood pressure Mother   ? Stroke Mother   ? Other Mother   ?     Borderline DM  ? Alzheimer's disease Mother   ? Ulcerative colitis Father   ? Meniere's disease Father   ? Hearing loss Father   ? Stroke Father   ? Stroke Sister   ?  ? ?Review of Systems:   ?Please see the history of present illness.    ?All other systems reviewed and are negative. ?  ? ? ?EKGs/Labs/Other Test Reviewed:   ? ?EKG:  EKG performed February 2023 that I personally reviewed demonstrates sinus rhythm; EKG today demonstrates sinus rhythm ? ?Prior CV studies: ? ?TCD 2020 ?A vascular evaluation was performed. The right middle cerebral artery was  ?studied. An IV was inserted into the patient's right forearm. Verbal  ?informed consent was obtained.  ?   ?HITS at rest and during Valsalva.  ?Partial curtain noted during Valsalva.  ? ?Positive TCD Bubble study indicative of medium size right to left shunt  ?*See table(s) above for measurements and observations. ? ?Other studies Reviewed: ?Review of the additional studies/records demonstrates: None relevant ? ?Recent Labs: ?12/01/2021: BUN 13; Creatinine, Ser 1.17; Hemoglobin 13.9; Platelets 434; Potassium 4.2; Sodium 141  ? ?Recent Lipid Panel ?Lab Results  ?Component Value Date/Time  ? CHOL 132 05/23/2016 09:10 AM  ? TRIG 195 (H) 06/10/2018 04:25 AM  ? HDL 46 05/23/2016 09:10 AM  ? LDLCALC 40 05/23/2016 09:10 AM  ? ? ?Risk Assessment/Calculations:   ? ?  ?     ? ?Physical Exam:   ? ?VS:  BP 126/74   Pulse 82   Ht '5\' 5"'$  (1.651 m)   Wt 199 lb 12.8 oz (90.6 kg)   SpO2 98%   BMI 33.25 kg/m?    ?Wt Readings from Last 3 Encounters:  ?02/09/22 199 lb 12.8 oz (90.6 kg)  ?12/27/21 198 lb (89.8 kg)  ?12/07/21 193 lb (87.5 kg)  ?  ?GENERAL:  No apparent distress, AOx3 ?HEENT:  No carotid bruits, +2 carotid impulses, no scleral icterus ?CAR: RRR no murmurs, gallops, rubs, or thrills ?RES:  Clear to auscultation bilaterally ?ABD:  Soft, nontender, nondistended, positive bowel sounds x 4 ?VASC:  +2 radial pulses, +2 carotid pulses, palpable pedal pulses ?NEURO:  CN 2-12 grossly intact; motor and sensory grossly  intact ?PSYCH:  No active depression or anxiety ?EXT:  No edema, ecchymosis, or cyanosis ? ?Signed, ?Early Osmond, MD  ?02/09/2022 11:45 AM    ?[facility] ?[address], [place], [place]  [address] ?Phone: [phone]; Fax: [phone]  ? ?Note:  This document was prepared using Dragon voice recognition software and may include unintentional dictation errors. ?

## 2022-02-14 DIAGNOSIS — M5416 Radiculopathy, lumbar region: Secondary | ICD-10-CM | POA: Diagnosis not present

## 2022-02-14 DIAGNOSIS — M5451 Vertebrogenic low back pain: Secondary | ICD-10-CM | POA: Diagnosis not present

## 2022-02-15 ENCOUNTER — Telehealth (HOSPITAL_COMMUNITY): Payer: Self-pay | Admitting: *Deleted

## 2022-02-15 ENCOUNTER — Encounter (HOSPITAL_COMMUNITY): Payer: Self-pay | Admitting: *Deleted

## 2022-02-15 NOTE — Telephone Encounter (Signed)
Instructions for upcoming myocardial perfusion sent via my chart per patient request.  Kirstie Peri, RN  ?

## 2022-02-16 DIAGNOSIS — M5451 Vertebrogenic low back pain: Secondary | ICD-10-CM | POA: Diagnosis not present

## 2022-02-20 DIAGNOSIS — L501 Idiopathic urticaria: Secondary | ICD-10-CM | POA: Diagnosis not present

## 2022-02-22 ENCOUNTER — Ambulatory Visit (HOSPITAL_COMMUNITY): Payer: Medicare Other

## 2022-02-22 DIAGNOSIS — Z20822 Contact with and (suspected) exposure to covid-19: Secondary | ICD-10-CM | POA: Diagnosis not present

## 2022-02-27 ENCOUNTER — Other Ambulatory Visit: Payer: Self-pay

## 2022-02-27 MED ORDER — PRAMIPEXOLE DIHYDROCHLORIDE 0.125 MG PO TABS: 0.3750 mg | ORAL_TABLET | Freq: Every evening | ORAL | 1 refills | Status: DC | PRN

## 2022-02-27 MED ORDER — PREGABALIN 75 MG PO CAPS: 75.0000 mg | ORAL_CAPSULE | Freq: Two times a day (BID) | ORAL | 1 refills | Status: DC

## 2022-02-28 ENCOUNTER — Telehealth (HOSPITAL_COMMUNITY): Payer: Self-pay | Admitting: *Deleted

## 2022-02-28 DIAGNOSIS — Z1231 Encounter for screening mammogram for malignant neoplasm of breast: Secondary | ICD-10-CM | POA: Diagnosis not present

## 2022-02-28 NOTE — Telephone Encounter (Signed)
Left message on voicemail per DPR in reference to upcoming appointment scheduled on 03/06/2022 at 10:00 with detailed instructions given per Myocardial Perfusion Study Information Sheet for the test. LM to arrive 15 minutes early, and that it is imperative to arrive on time for appointment to keep from having the test rescheduled. If you need to cancel or reschedule your appointment, please call the office within 24 hours of your appointment. Failure to do so may result in a cancellation of your appointment, and a $50 no show fee. Phone number given for call back for any questions.  ? ?

## 2022-03-03 DIAGNOSIS — Z20822 Contact with and (suspected) exposure to covid-19: Secondary | ICD-10-CM | POA: Diagnosis not present

## 2022-03-03 DIAGNOSIS — Z4889 Encounter for other specified surgical aftercare: Secondary | ICD-10-CM | POA: Diagnosis not present

## 2022-03-06 ENCOUNTER — Ambulatory Visit (HOSPITAL_COMMUNITY): Payer: Medicare Other | Attending: Cardiology

## 2022-03-06 DIAGNOSIS — I700 Atherosclerosis of aorta: Secondary | ICD-10-CM | POA: Diagnosis not present

## 2022-03-06 DIAGNOSIS — R072 Precordial pain: Secondary | ICD-10-CM | POA: Diagnosis not present

## 2022-03-06 DIAGNOSIS — I471 Supraventricular tachycardia: Secondary | ICD-10-CM | POA: Insufficient documentation

## 2022-03-06 DIAGNOSIS — E7800 Pure hypercholesterolemia, unspecified: Secondary | ICD-10-CM | POA: Insufficient documentation

## 2022-03-06 DIAGNOSIS — Q2112 Patent foramen ovale: Secondary | ICD-10-CM | POA: Diagnosis not present

## 2022-03-06 LAB — MYOCARDIAL PERFUSION IMAGING
Base ST Depression (mm): 0 mm
LV dias vol: 55 mL (ref 46–106)
LV sys vol: 22 mL
Nuc Stress EF: 61 %
Peak HR: 91 {beats}/min
Rest HR: 71 {beats}/min
Rest Nuclear Isotope Dose: 32.7 mCi
SDS: 3
SRS: 0
SSS: 3
ST Depression (mm): 0 mm
TID: 0.97

## 2022-03-06 MED ORDER — REGADENOSON 0.4 MG/5ML IV SOLN
0.4000 mg | Freq: Once | INTRAVENOUS | Status: AC
  Administered 2022-03-06: 0.4 mg via INTRAVENOUS

## 2022-03-06 MED ORDER — TECHNETIUM TC 99M TETROFOSMIN IV KIT
32.7000 | PACK | Freq: Once | INTRAVENOUS | Status: AC | PRN
  Administered 2022-03-06: 32.7 via INTRAVENOUS
  Filled 2022-03-06: qty 33

## 2022-03-06 MED ORDER — TECHNETIUM TC 99M TETROFOSMIN IV KIT
10.1000 | PACK | Freq: Once | INTRAVENOUS | Status: AC | PRN
  Administered 2022-03-06: 10.1 via INTRAVENOUS
  Filled 2022-03-06: qty 11

## 2022-03-10 DIAGNOSIS — E039 Hypothyroidism, unspecified: Secondary | ICD-10-CM | POA: Diagnosis not present

## 2022-03-16 DIAGNOSIS — R635 Abnormal weight gain: Secondary | ICD-10-CM | POA: Diagnosis not present

## 2022-03-16 DIAGNOSIS — E039 Hypothyroidism, unspecified: Secondary | ICD-10-CM | POA: Diagnosis not present

## 2022-03-22 DIAGNOSIS — L501 Idiopathic urticaria: Secondary | ICD-10-CM | POA: Diagnosis not present

## 2022-03-28 DIAGNOSIS — L719 Rosacea, unspecified: Principal | ICD-10-CM

## 2022-03-28 MED ORDER — DOXYCYCLINE HYCLATE 20 MG TABLET
ORAL_TABLET | Freq: Two times a day (BID) | ORAL | 2 refills | 10 days | Status: CP
Start: 2022-03-28 — End: 2022-04-07

## 2022-04-05 DIAGNOSIS — G4733 Obstructive sleep apnea (adult) (pediatric): Secondary | ICD-10-CM | POA: Diagnosis not present

## 2022-04-11 MED ORDER — PREDNISONE 10 MG TABLET
ORAL_TABLET | Freq: Every day | ORAL | 0 refills | 65 days | Status: CP
Start: 2022-04-11 — End: ?

## 2022-04-17 ENCOUNTER — Other Ambulatory Visit: Admit: 2022-04-17 | Discharge: 2022-04-17 | Payer: MEDICARE

## 2022-04-17 ENCOUNTER — Ambulatory Visit: Admit: 2022-04-17 | Discharge: 2022-04-17 | Payer: MEDICARE

## 2022-04-17 DIAGNOSIS — L509 Urticaria, unspecified: Principal | ICD-10-CM

## 2022-04-17 DIAGNOSIS — Z79899 Other long term (current) drug therapy: Principal | ICD-10-CM

## 2022-04-17 DIAGNOSIS — L958 Other vasculitis limited to the skin: Principal | ICD-10-CM

## 2022-04-17 MED ORDER — PREDNISONE 5 MG TABLET
ORAL_TABLET | Freq: Every day | ORAL | 0 refills | 30 days | Status: CP
Start: 2022-04-17 — End: ?

## 2022-04-17 MED ORDER — FOLIC ACID 1 MG TABLET
ORAL_TABLET | Freq: Every day | ORAL | 3 refills | 100.00000 days | Status: CP
Start: 2022-04-17 — End: 2023-04-17

## 2022-04-17 MED ORDER — METHOTREXATE SODIUM (PF) 25 MG/ML INJECTION SOLUTION
INTRAVENOUS | 0 refills | 231 days | Status: CP
Start: 2022-04-17 — End: 2022-04-17

## 2022-04-17 MED ORDER — METHOTREXATE SODIUM 2.5 MG TABLET
ORAL_TABLET | 3 refills | 0 days | Status: CP
Start: 2022-04-17 — End: ?

## 2022-04-17 MED ORDER — DUPILUMAB 300 MG/2 ML SUBCUTANEOUS PEN INJECTOR
Freq: Once | SUBCUTANEOUS | 0 refills | 0.00000 days | Status: CP
Start: 2022-04-17 — End: 2022-04-17

## 2022-04-19 DIAGNOSIS — Z780 Asymptomatic menopausal state: Secondary | ICD-10-CM | POA: Diagnosis not present

## 2022-04-19 DIAGNOSIS — M85851 Other specified disorders of bone density and structure, right thigh: Secondary | ICD-10-CM | POA: Diagnosis not present

## 2022-04-25 DIAGNOSIS — L501 Idiopathic urticaria: Secondary | ICD-10-CM | POA: Diagnosis not present

## 2022-05-04 NOTE — Unmapped (Signed)
[facility] Specialty Medication Onboarding    Specialty Medication: DUPIXENT PEN 300 mg/2 mL Pnij (dupilumab)  Prior Authorization: Approved   Financial Assistance: No - patient doesn't qualify for additional assistance   Final Copay/Day Supply: $1,264.81 / 14 (loading)          $1,264.81 / 28 (maintenance)    Insurance Restrictions: None     Notes to Pharmacist: Patient Not Eligible -- Dupixent [facility] only accepts patients with FDA approved diagnosis. Patient's diagnosis will disqualify them from applying.     The triage team has completed the benefits investigation and has determined that the patient is able to fill this medication at [facility]. Please contact the patient to complete the onboarding or follow up with the prescribing physician as needed.

## 2022-05-08 NOTE — Unmapped (Signed)
[facility] Clinical Intervention    Type of intervention: Medication access/cost    Medication involved: Dupixent    Problem identified: Karen Fry's Dupixent has been approved but is high cost (>$1000 per month).     Intervention performed: I called to review this, and that unfortunately the [facility] Program doesn't accept patients using medication for off-label uses.     I suggested we notify Dr. Orma Flaming to see if there were alternative approaches - possibly other diagnoses (patient has history of asthma), or possible the [employer] Compassionate Use program.    Follow-up needed: Will message provider(s) for additional thoughts. Patient has our contact if she wishes to order medication through her insurance.    Approximate time spent: 15-20 minutes    Clinical evidence used to support intervention: Professional judgement    Result of the intervention: Cost savings - possible    Ivin Rosenbloom A [employer] [facility] Specialty Pharmacist

## 2022-05-09 DIAGNOSIS — L509 Urticaria, unspecified: Principal | ICD-10-CM

## 2022-05-09 DIAGNOSIS — L958 Other vasculitis limited to the skin: Principal | ICD-10-CM

## 2022-05-09 MED ORDER — PREDNISONE 10 MG TABLET
ORAL_TABLET | Freq: Every day | ORAL | 2 refills | 30 days | Status: CP
Start: 2022-05-09 — End: 2022-08-07

## 2022-05-09 MED ORDER — METHOTREXATE SODIUM 2.5 MG TABLET
ORAL_TABLET | ORAL | 2 refills | 28 days | Status: CP
Start: 2022-05-09 — End: 2022-08-07

## 2022-05-09 NOTE — Unmapped (Signed)
Spoke to patient by phone.  Notes hives that last several days and leave bruising.  Lesions burn, but also itch.      She is currently on:  - Methotrexate 7.5mg  weekly (started 04/17/22)  - Plaquenil 200mg  bid (started 10/2021)  - Prednisone 5mg  daily (started since last visit as a rescue)  - Antihistamines: Allegra 360mg  QAM, Zyrtec 20mg  at bedtime     Mutual decision made to:  - Increase methotrexate to 15mg  weekly (she understands it takes about 3 months for methotrexate to show efficacy, but my worry is that 7.5mg  won't be enough to control her)  - Start prednisone 60mg  daily; when lesions clear she will taper by 10mg  weekly and if new lesions appear go back to the last dose of prednisone that kept things controlled  - Continue plaquenil 200mg  bid  - Continue Xolair 300mg  monthly  - Continue antihistamines  - Recommend she contact Dr. 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

## 2022-05-09 NOTE — Unmapped (Signed)
Spoke with patient by phone.  See mychart message documentation.

## 2022-05-09 NOTE — Unmapped (Signed)
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 Please contact patient. She states it is much worse since the last time she saw you.

## 2022-05-17 NOTE — Unmapped (Signed)
Specialty Medication(s): Dupixent    Ms.Dockery has been dis-enrolled from the [facility] specialty pharmacy services due to  cost .    Additional information provided to the patient: Currently the dermatology team is focusing on different treatments.     Lanney Gins  [facility] Specialty Pharmacist

## 2022-05-19 DIAGNOSIS — E039 Hypothyroidism, unspecified: Secondary | ICD-10-CM | POA: Diagnosis not present

## 2022-05-22 ENCOUNTER — Encounter: Payer: Self-pay | Admitting: Cardiology

## 2022-05-22 ENCOUNTER — Ambulatory Visit (INDEPENDENT_AMBULATORY_CARE_PROVIDER_SITE_OTHER): Payer: Medicare Other | Admitting: Cardiology

## 2022-05-22 VITALS — BP 100/50 | HR 80 | Ht 65.0 in | Wt 191.0 lb

## 2022-05-22 DIAGNOSIS — Q2112 Patent foramen ovale: Secondary | ICD-10-CM

## 2022-05-22 DIAGNOSIS — E7800 Pure hypercholesterolemia, unspecified: Secondary | ICD-10-CM | POA: Diagnosis not present

## 2022-05-22 DIAGNOSIS — I471 Supraventricular tachycardia: Secondary | ICD-10-CM

## 2022-05-22 DIAGNOSIS — R072 Precordial pain: Secondary | ICD-10-CM

## 2022-05-22 DIAGNOSIS — L501 Idiopathic urticaria: Secondary | ICD-10-CM | POA: Diagnosis not present

## 2022-05-22 NOTE — Patient Instructions (Signed)
Medication Instructions:  The current medical regimen is effective;  continue present plan and medications.  *If you need a refill on your cardiac medications before your next appointment, please call your pharmacy*  Follow-Up: At [facility], you and your health needs are our priority.  As part of our continuing mission to provide you with exceptional heart care, we have created designated Provider Care Teams.  These Care Teams include your primary Cardiologist (physician) and Advanced Practice Providers (APPs -  Physician Assistants and Nurse Practitioners) who all work together to provide you with the care you need, when you need it.  We recommend signing up for the patient portal called "MyChart".  Sign up information is provided on this After Visit Summary.  MyChart is used to connect with patients for Virtual Visits (Telemedicine).  Patients are able to view lab/test results, encounter notes, upcoming appointments, etc.  Non-urgent messages can be sent to your provider as well.   To learn more about what you can do with MyChart, go to [email or web address].    Your next appointment:   1 year(s)  The format for your next appointment:   In Person  Provider:   Mark Skains, MD {   Important Information About Sugar       

## 2022-05-22 NOTE — Progress Notes (Signed)
Cardiology Office Note:    Date:  05/22/2022   ID:  [name], DOB 08-04-1956, MRN [id number]  PCP:  Wardell Honour, MD   [facility] Cardiologist:  Candee Furbish, MD     Referring MD: Wardell Honour, MD    History of Present Illness:    [name] is a 66 y.o. female here for the follow-up of chest discomfort.  Ended up having a pharmacologic stress test which overall was low risk.  Excellent.  She saw my partner Dr. Ali Lowe on 02/09/2022.  Notes reviewed.  Has been on prophylactic aspirin secondary to transcranial Doppler results.  Has aortic atherosclerosis on statin.  Previously had symptom of chest discomfort coming and going through the day nitroglycerin seemed to help.  Pressure with some tingling.  Back surgery was uncomplicated.  Feels well now. Bill hand sugery.   Past Medical History:  Diagnosis Date   Asthma    Back pain    Chronic fatigue syndrome    Dr. Sabra Heck   Complication of anesthesia    Cough    /SOB, PFT's nl, improved with Bronchodilation 8/11   Dysrhythmia    Endometrial cancer ([place])    Esophageal spasm    GERD Dr. Sabra Heck, Dr. Wynetta Emery; egd 2006 nl; UGI 3/13 Small HH, moderate GERD, nl motility; seen at [facility] ([place] egd? neg, sone improvement on sucralfate susp   Fibromyalgia    Foot pain    Dr. Rushie Nyhan   Hashimoto's thyroiditis    High triglycerides    Hypothyroidism    Dr. Lewis Shock 4/14   Leukocytoclastic vasculitis ([place])    Dr. Tonia Brooms   Low HDL (under 40)    Memory loss    Mitral valve prolapse    Paroxysmal atrial tachycardia ([place])    Dr. Marlou Porch   Patent foramen ovale    Pernicious anemia    Dr. Sabra Heck   PONV (postoperative nausea and vomiting)    along time ago had nausea ans vomiting after surgery-25 years ago   Reflux    ? delayed gastric emptying--GES nl 01/2012 (6% at 2hrs)   RLS (restless legs syndrome)    low ferritin Dr. Sabra Heck   Rosacea    Sleep apnea    Uterine carcinoma [facility])    Dr. Polly Cobia     Past Surgical History:  Procedure Laterality Date   ABDOMINAL EXPOSURE N/A 12/07/2021   Procedure: ABDOMINAL EXPOSURE;  Surgeon: Marty Heck, MD;  Location: [place];  Service: Vascular;  Laterality: N/A;   ABDOMINAL HYSTERECTOMY     bladder tack     x 2   BUNIONECTOMY     CHOLECYSTECTOMY  2009   COLONOSCOPY     scr, 12/2008 ([place]) nl   COLOSTOMY Left 06/05/2018   Procedure: COLOSTOMY;  Surgeon: Ralene Ok, MD;  Location: [place];  Service: General;  Laterality: Left;   COLOSTOMY REVERSAL N/A 10/16/2018   Procedure: HARTMANN'S COLOSTOMY REVERSAL, RIGID PROCTOSCOPY;  Surgeon: Ralene Ok, MD;  Location: [facility];  Service: General;  Laterality: N/A;   HAMMER TOE SURGERY     HYSTERECTOMY ABDOMINAL WITH SALPINGO-OOPHORECTOMY  2008   [place]  04/09/2019   x2   [place] N/A 04/09/2019   Procedure: INCISIONAL HERNIA REPAIR WITH MESH;  Surgeon: Ralene Ok, MD;  Location: [place];  Service: General;  Laterality: N/A;   LAPAROSCOPY N/A 10/16/2018   Procedure: LAPAROSCOPY DIAGNOSTIC WITH LYSIS OF ADHESIONS;  Surgeon: Ralene Ok, MD;  Location: [facility];  Service: General;  Laterality: N/A;   LUNG BIOPSY     L Lower lung pulm nodules, largest 4.65m, low risk, repeat CT in 1 yr now [facility]13 Stable tiny lung nodules, no f/u needed   LYMPH NODE DISSECTION  2008   LYSIS OF ADHESION N/A 04/09/2019   Procedure: OPEN LYSIS OF ADHESION;  Surgeon: RRalene Ok MD;  [place]  Service: General;  Laterality: N/A;   METATARSAL OSTEOTOMY     NECK SURGERY     x 2   OBLIQUE LUMBAR INTERBODY FUSION 1 LEVEL WITH PERCUTANEOUS SCREWS N/A 12/07/2021   Procedure: OBLIQUE LUMBAR INTERBODY FUSION 1 LEVEL WITH PERCUTANEOUS SCREWS (OLIF L4-5 WITH POSTERIOR SPINAL FUSION AND PEDICLE SCREWS);  Surgeon: BMelina Schools MD;  [place]  Service: Orthopedics;  Laterality: N/A;  4 HRS DR. CLARK TO DO APPROACH LEFT TAP BLOCK  WITH EXPAREL 3 C-BED   PARTIAL COLECTOMY N/A 06/05/2018   Procedure: EXPLORATORY LAPAROTOMY AND PARTIAL COLECTOMY;  Surgeon: RRalene Ok MD;  [place]  Service: General;  Laterality: N/A;   TONSILLECTOMY     [place]ARD[place]     with Nuclear test, Dr. SMarlou Porch Low risk   VESICOVAGINAL FISTULA CLOSURE W/ TAH      Current Medications: Current Meds  Medication Sig   acetaminophen (TYLENOL) 500 MG tablet Take 2 tablets (1,000 mg total) by mouth every 6 (six) hours as needed.   albuterol (PROVENTIL HFA;VENTOLIN HFA) 108 (90 Base) MCG/ACT inhaler Inhale 2 puffs into the lungs every 4 (four) hours as needed for wheezing or shortness of breath.   ARIPiprazole (ABILIFY) 2 MG tablet TAKE 1 TABLET BY MOUTH  DAILY   Ascorbic Acid (VITAMIN C) 1000 MG tablet Take 1,000 mg by mouth in the morning.   aspirin EC 81 MG tablet Take 1 tablet (81 mg total) by mouth daily. Swallow whole.   atorvastatin (LIPITOR) 10 MG tablet TAKE 1 TABLET BY MOUTH  DAILY   Azelaic Acid (FINACEA EX) Apply 1 application  topically at bedtime.   buPROPion (WELLBUTRIN XL) 150 MG 24 hr tablet Take 450 mg by mouth daily.   cetirizine (ZYRTEC) 10 MG tablet Take 10 mg by mouth at bedtime.   cyanocobalamin (,VITAMIN B-12,) 1000 MCG/ML injection Inject 1 mL (1,000 mcg total) into the muscle every 30 (thirty) days.   docusate sodium (COLACE) 100 MG capsule Take 100 mg by mouth 2 (two) times daily as needed for mild constipation.   doxycycline (PERIOSTAT) 20 MG tablet Take 20 mg by mouth 2 (two) times daily.   DULoxetine (CYMBALTA) 30 MG capsule Take 90 mg by mouth at bedtime.   EPINEPHrine 0.3 mg/0.3 mL IJ SOAJ injection Inject 0.3 mg into the muscle as needed for anaphylaxis.   fexofenadine (ALLEGRA) 180 MG tablet Take 180 mg by mouth daily.   fluticasone-salmeterol (ADVAIR HFA) 115-21 MCG/ACT inhaler Inhale 2 puffs into the lungs 2 (two) times daily.   hydroxychloroquine (PLAQUENIL) 200 MG tablet Take 200 mg by mouth 2  (two) times daily with a meal.   ipratropium (ATROVENT) 0.06 % nasal spray Place 2 sprays into both nostrils in the morning and at bedtime.   ketotifen (ZADITOR) 0.025 % ophthalmic solution Place 1 drop into both eyes in the morning.   Levothyroxine Sodium 175 MCG CAPS Take 175 mcg by mouth daily before breakfast.   metoprolol succinate (TOPROL-XL) 25 MG 24 hr tablet TAKE 1 TABLET BY MOUTH ONCE DAILY   nitroGLYCERIN (NITROSTAT) 0.4 MG  SL tablet Place 1 tablet (0.4 mg total) under the tongue every 5 (five) minutes as needed for chest pain.   omalizumab (XOLAIR) 150 MG injection 300 mg every 30 (thirty) days. On the 13th of the month   pantoprazole (PROTONIX) 40 MG tablet Take 80 mg by mouth 2 (two) times daily.   polyethylene glycol (MIRALAX / GLYCOLAX) 17 g packet Take 17 g by mouth daily as needed (constipation.).   pramipexole (MIRAPEX) 0.125 MG tablet Take 3 tablets (0.375 mg total) by mouth at bedtime as needed (restless legs syndrome).   pregabalin (LYRICA) 75 MG capsule Take 1 capsule (75 mg total) by mouth 2 (two) times daily.   Probiotic Product (PROBIOTIC PO) Take 1 capsule by mouth in the morning.   Syringe, Disposable, (B-D SYRINGE LUER-LOK 3CC) 3 ML MISC      Allergies:   Aspirin, Bee venom, Peanut-containing drug products, Ambien [zolpidem tartrate], Tape, Ciprofloxacin, Latex, Minocycline, and Shellfish allergy   Social History   Socioeconomic History   Marital status: Married    Spouse name: Not on file   Number of children: Not on file   Years of education: Not on file   Highest education level: Not on file  Occupational History   Not on file  Tobacco Use   Smoking status: Never   Smokeless tobacco: Never  Vaping Use   Vaping Use: Never used  Substance and Sexual Activity   Alcohol use: Yes    Comment: maybe a glass of wine/month   Drug use: Never   Sexual activity: Not on file  Other Topics Concern   Not on file  Social History Narrative   Diet: High calorie/  high fat      Caffeine: Yes      Married, if yes what year: Married, 1986      Do you live in a house, apartment, assisted living, condo, trailer, ect: House      Is it one or more stories: One story      How many persons live in your home? 2      Pets: 2      Highest level or education completed: completed college, some graduate school      Current/Past profession: Retail-Division management      Exercise: Rarely                 Type and how often: Not often- experience PEM         Living Will:    DNR:   POA/HPOA:      Functional Status:   Do you have difficulty bathing or dressing yourself? No   Do you have difficulty preparing food or eating?No   Do you have difficulty managing your medications?No   Do you have difficulty managing your finances?No   Do you have difficulty affording your medications?No   Social Determinants of Radio broadcast assistant Strain: Not on file  Food Insecurity: Not on file  Transportation Needs: Not on file  Physical Activity: Not on file  Stress: Not on file  Social Connections: Not on file     Family History: The patient's family history includes Alzheimer's disease in her mother; Hashimoto's thyroiditis in her mother; Hearing loss in her father; High blood pressure in her mother; Meniere's disease in her father; Other in her mother; Stroke in her father, mother, and sister; Ulcerative colitis in her father.  ROS:   Please see the history of present illness.     All  other systems reviewed and are negative.  EKGs/Labs/Other Studies Reviewed:    The following studies were reviewed today: TCD 28-Jan-2019 A vascular evaluation was performed. The right middle cerebral artery was  studied. An IV was inserted into the patient's right forearm. Verbal  informed consent was obtained.     HITS at rest and during Valsalva.  Partial curtain noted during Valsalva.   Positive TCD Bubble study indicative of medium size right to left shunt  *See  table(s) above for measurements and observations   NUC stress 03/06/22:   The study is normal. The study is low risk.   No ST deviation was noted.   LV perfusion is normal. There is no evidence of ischemia. There is no evidence of infarction.   Left ventricular function is normal. Nuclear stress EF: 61 %. The left ventricular ejection fraction is normal (55-65%). End diastolic cavity size is normal. End systolic cavity size is normal.   Recent Labs: 12/01/2021: BUN 13; Creatinine, Ser 1.17; Hemoglobin 13.9; Platelets 434; Potassium 4.2; Sodium 141  Recent Lipid Panel    Component Value Date/Time   CHOL 132 05/23/2016 0910   TRIG 195 (H) 06/10/2018 0425   HDL 46 05/23/2016 0910   CHOLHDL 2.9 05/23/2016 0910   VLDL 46 (H) 05/23/2016 0910   LDLCALC 40 05/23/2016 0910     Risk Assessment/Calculations:              Physical Exam:    VS:  BP (!) 100/50 (BP Location: Left Arm, Patient Position: Sitting, Cuff Size: Normal)   Pulse 80   Ht '5\' 5"'$  (1.651 m)   Wt 191 lb (86.6 kg)   SpO2 97%   BMI 31.78 kg/m     Wt Readings from Last 3 Encounters:  05/22/22 191 lb (86.6 kg)  03/06/22 199 lb (90.3 kg)  02/09/22 199 lb 12.8 oz (90.6 kg)     GEN:  Well nourished, well developed in no acute distress HEENT: Normal NECK: No JVD; No carotid bruits LYMPHATICS: No lymphadenopathy CARDIAC: RRR, no murmurs, no rubs, gallops RESPIRATORY:  Clear to auscultation without rales, wheezing or rhonchi  ABDOMEN: Soft, non-tender, non-distended MUSCULOSKELETAL:  No edema; No deformity  SKIN: Warm and dry NEUROLOGIC:  Alert and oriented x 3 PSYCHIATRIC:  Normal affect   ASSESSMENT:    1. Paroxysmal atrial tachycardia ([place])   2. PFO (patent foramen ovale)   3. Pure hypercholesterolemia   4. Precordial pain    PLAN:    In order of problems listed above:    Chest pain - Nuclear stress test 01-27-2022 low risk.  Excellent.  No further symptoms.  PFO - Aspirin, no symptoms.  Carotid  arteries - Minimal soft plaque bilaterally. - On aspirin, atorvastatin 10 mg last LDL 67.  ALT 14.  Excellent  Paroxysmal atrial tachycardia - No symptoms currently.  Well-controlled on Toprol low-dose 25 mg.      Medication Adjustments/Labs and Tests Ordered: Current medicines are reviewed at length with the patient today.  Concerns regarding medicines are outlined above.  No orders of the defined types were placed in this encounter.  No orders of the defined types were placed in this encounter.   Patient Instructions  Medication Instructions:  The current medical regimen is effective;  continue present plan and medications.  *If you need a refill on your cardiac medications before your next appointment, please call your pharmacy*  Follow-Up: At [facility], you and your health needs are our priority.  As part  of our continuing mission to provide you with exceptional heart care, we have created designated Provider Care Teams.  These Care Teams include your primary Cardiologist (physician) and Advanced Practice Providers (APPs -  Physician Assistants and Nurse Practitioners) who all work together to provide you with the care you need, when you need it.  We recommend signing up for the patient portal called "MyChart".  Sign up information is provided on this After Visit Summary.  MyChart is used to connect with patients for Virtual Visits (Telemedicine).  Patients are able to view lab/test results, encounter notes, upcoming appointments, etc.  Non-urgent messages can be sent to your provider as well.   To learn more about what you can do with MyChart, go to [email or web address].    Your next appointment:   1 year(s)  The format for your next appointment:   In Person  Provider:   Candee Furbish, MD {    Important Information About Sugar         Signed, Candee Furbish, MD  05/22/2022 11:14 AM    [place]

## 2022-05-29 ENCOUNTER — Ambulatory Visit
Admit: 2022-05-29 | Discharge: 2022-05-30 | Payer: MEDICARE | Attending: Student in an Organized Health Care Education/Training Program | Primary: Student in an Organized Health Care Education/Training Program

## 2022-05-29 DIAGNOSIS — Z79899 Other long term (current) drug therapy: Principal | ICD-10-CM

## 2022-05-29 DIAGNOSIS — L719 Rosacea, unspecified: Principal | ICD-10-CM

## 2022-05-29 DIAGNOSIS — L958 Other vasculitis limited to the skin: Principal | ICD-10-CM

## 2022-05-29 MED ORDER — METHOTREXATE SODIUM 2.5 MG TABLET
ORAL_TABLET | ORAL | 2 refills | 28.00000 days | Status: CP
Start: 2022-05-29 — End: 2022-08-27

## 2022-05-29 MED ORDER — FOLIC ACID 1 MG TABLET
ORAL_TABLET | Freq: Every day | ORAL | 3 refills | 90.00000 days | Status: CP
Start: 2022-05-29 — End: 2023-05-29

## 2022-05-29 MED ORDER — PREDNISONE 5 MG TABLET
ORAL_TABLET | Freq: Every day | ORAL | 2 refills | 6.00000 days | Status: CP
Start: 2022-05-29 — End: ?

## 2022-06-15 DIAGNOSIS — H2513 Age-related nuclear cataract, bilateral: Secondary | ICD-10-CM | POA: Diagnosis not present

## 2022-06-15 DIAGNOSIS — H52223 Regular astigmatism, bilateral: Secondary | ICD-10-CM | POA: Diagnosis not present

## 2022-06-15 DIAGNOSIS — Z79899 Other long term (current) drug therapy: Secondary | ICD-10-CM | POA: Diagnosis not present

## 2022-06-15 DIAGNOSIS — H25013 Cortical age-related cataract, bilateral: Secondary | ICD-10-CM | POA: Diagnosis not present

## 2022-06-16 DIAGNOSIS — E039 Hypothyroidism, unspecified: Secondary | ICD-10-CM | POA: Diagnosis not present

## 2022-06-16 DIAGNOSIS — E063 Autoimmune thyroiditis: Secondary | ICD-10-CM | POA: Diagnosis not present

## 2022-06-26 DIAGNOSIS — L501 Idiopathic urticaria: Secondary | ICD-10-CM | POA: Diagnosis not present

## 2022-06-27 ENCOUNTER — Ambulatory Visit: Payer: Medicare Other | Admitting: Family Medicine

## 2022-06-30 ENCOUNTER — Other Ambulatory Visit: Payer: Self-pay | Admitting: Family

## 2022-06-30 DIAGNOSIS — F339 Major depressive disorder, recurrent, unspecified: Secondary | ICD-10-CM

## 2022-07-27 DIAGNOSIS — L501 Idiopathic urticaria: Secondary | ICD-10-CM | POA: Diagnosis not present

## 2022-07-29 DIAGNOSIS — Z23 Encounter for immunization: Secondary | ICD-10-CM | POA: Diagnosis not present

## 2022-07-31 ENCOUNTER — Ambulatory Visit: Admit: 2022-07-31 | Discharge: 2022-07-31 | Payer: MEDICARE

## 2022-07-31 ENCOUNTER — Ambulatory Visit
Admit: 2022-07-31 | Discharge: 2022-07-31 | Payer: MEDICARE | Attending: Student in an Organized Health Care Education/Training Program | Primary: Student in an Organized Health Care Education/Training Program

## 2022-07-31 DIAGNOSIS — R110 Nausea: Principal | ICD-10-CM

## 2022-07-31 DIAGNOSIS — L958 Other vasculitis limited to the skin: Principal | ICD-10-CM

## 2022-07-31 DIAGNOSIS — Z79899 Other long term (current) drug therapy: Principal | ICD-10-CM

## 2022-07-31 DIAGNOSIS — L508 Other urticaria: Principal | ICD-10-CM

## 2022-07-31 DIAGNOSIS — M318 Other specified necrotizing vasculopathies: Secondary | ICD-10-CM | POA: Diagnosis not present

## 2022-07-31 MED ORDER — METHOTREXATE SODIUM 2.5 MG TABLET
ORAL_TABLET | ORAL | 0 refills | 14.00000 days | Status: CP
Start: 2022-07-31 — End: ?

## 2022-07-31 MED ORDER — LEUCOVORIN CALCIUM 10 MG TABLET
ORAL_TABLET | ORAL | 3 refills | 84.00000 days | Status: CP
Start: 2022-07-31 — End: 2023-07-31

## 2022-07-31 MED ORDER — CLOBETASOL 0.05 % SCALP SOLUTION
Freq: Two times a day (BID) | TOPICAL | 4 refills | 0.00000 days | Status: CP | PRN
Start: 2022-07-31 — End: 2023-07-31

## 2022-07-31 MED ORDER — ONDANSETRON 4 MG DISINTEGRATING TABLET
ORAL_TABLET | Freq: Three times a day (TID) | ORAL | 1 refills | 7.00000 days | Status: CP | PRN
Start: 2022-07-31 — End: 2022-08-30

## 2022-08-16 ENCOUNTER — Ambulatory Visit (INDEPENDENT_AMBULATORY_CARE_PROVIDER_SITE_OTHER): Payer: Medicare Other | Admitting: Family Medicine

## 2022-08-16 ENCOUNTER — Encounter: Payer: Self-pay | Admitting: Family Medicine

## 2022-08-16 VITALS — BP 130/82 | HR 85 | Temp 96.9°F | Ht 65.0 in | Wt 197.0 lb

## 2022-08-16 DIAGNOSIS — M797 Fibromyalgia: Secondary | ICD-10-CM

## 2022-08-16 DIAGNOSIS — R3589 Other polyuria: Secondary | ICD-10-CM

## 2022-08-16 DIAGNOSIS — L959 Vasculitis limited to the skin, unspecified: Secondary | ICD-10-CM | POA: Diagnosis not present

## 2022-08-16 DIAGNOSIS — R7301 Impaired fasting glucose: Secondary | ICD-10-CM | POA: Diagnosis not present

## 2022-08-16 DIAGNOSIS — Z131 Encounter for screening for diabetes mellitus: Secondary | ICD-10-CM

## 2022-08-16 MED ORDER — PREGABALIN 75 MG PO CAPS: 75.0000 mg | ORAL_CAPSULE | Freq: Two times a day (BID) | ORAL | 1 refills | Status: DC

## 2022-08-16 NOTE — Progress Notes (Signed)
Provider:  Alain Honey, MD  Careteam: Patient Care Team: Wardell Honour, MD as PCP - General (Family Medicine) Jerline Pain, MD as PCP - Cardiology (Cardiology) Warren Danes, PA-C as Physician Assistant (Dermatology) Tiajuana Amass, MD as Referring Physician (Allergy and Immunology) Ronald Lobo, MD as Consulting Physician (Gastroenterology) Ralene Ok, MD as Consulting Physician (General Surgery) Melina Schools, MD as Consulting Physician (Orthopedic Surgery) Baxter Kail, MD (Internal Medicine) Regal, Tamala Fothergill, DPM as Consulting Physician (Podiatry) Jacelyn Pi, MD as Referring Physician (Endocrinology) Sanjuana Kava, MD (Inactive) as Referring Physician (Obstetrics and Gynecology) Fonnie Mu, OD (Optometry) Rana Snare, MD (Inactive) (Urology) Blima Dessert, MD as Referring Physician (Rheumatology)  PLACE OF SERVICE:  [place]  Advanced Directive information    Allergies  Allergen Reactions   Aspirin Anaphylaxis    In higher doses   Bee Venom Shortness Of Breath, Itching, Swelling and Hives   Peanut-Containing Drug Products Anaphylaxis   Ambien [Zolpidem Tartrate] Other (See Comments)    Hallucinations   Tape Itching, Swelling and Rash   Ciprofloxacin Nausea And Vomiting   Latex Rash   Minocycline Rash   Shellfish Allergy Rash    Chief Complaint  Patient presents with   Medical Management of Chronic Issues    Patient presents today for a 7 month follow-up   Quality Metric Gaps    Mammogram DEXA scan, colonoscopy, zoster, pneumonia      HPI: Patient is a 66 y.o. female patient is here to follow-up depression, hypothyroidism, chronic fatigue syndrome She reports her energy levels are not as good as at last visit.  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  She is on multiple medicines including the prednisone for vasculitis as well as several antidepressants. She does complain  today of some neuropathy in her feet.  She is out of Lyrica but does take large doses of Cymbalta.  I did refill the Lyrica but also told her about topical cream called FI BRO Review of Systems:  Review of Systems  Constitutional:  Positive for malaise/fatigue.  HENT: Negative.    Eyes: Negative.   Respiratory:  Positive for wheezing.   Cardiovascular: Negative.   Genitourinary:  Positive for frequency.  Musculoskeletal:  Positive for back pain and joint pain.  Skin: Negative.   Neurological:  Positive for weakness.  Endo/Heme/Allergies: Negative.   Psychiatric/Behavioral:  Positive for depression. The patient has insomnia.   All other systems reviewed and are negative.   Past Medical History:  Diagnosis Date   Asthma    Back pain    Chronic fatigue syndrome    Dr. Sabra Heck   Complication of anesthesia    Cough    /SOB, PFT's nl, improved with Bronchodilation 8/11   Dysrhythmia    Endometrial cancer ([place])    Esophageal spasm    GERD Dr. Sabra Heck, Dr. Wynetta Emery; egd 2006 nl; UGI 3/13 Small HH, moderate GERD, nl motility; seen at [facility] ([place]), egd? neg, sone improvement on sucralfate susp   Fibromyalgia    Foot pain    Dr. Rushie Nyhan   Hashimoto's thyroiditis    High triglycerides    Hypothyroidism    Dr. Lewis Shock 4/14   Leukocytoclastic vasculitis ([place])    Dr. Tonia Brooms   Low HDL (under 46)    Memory loss    Mitral valve prolapse    Paroxysmal atrial tachycardia    Dr. Marlou Porch   Patent foramen ovale    Pernicious anemia  Dr. Sabra Heck   PONV (postoperative nausea and vomiting)    along time ago had nausea ans vomiting after surgery-25 years ago   Reflux    ? delayed gastric emptying--GES nl 01/2012 (6% at 2hrs)   RLS (restless legs syndrome)    low ferritin Dr. Sabra Heck   Rosacea    Sleep apnea    Uterine carcinoma [facility])    Dr. Polly Cobia   Past Surgical History:  Procedure Laterality Date   ABDOMINAL EXPOSURE N/A 12/07/2021   Procedure: ABDOMINAL EXPOSURE;  Surgeon: Marty Heck, MD;  Location: [place];  Service: Vascular;  Laterality: N/A;   ABDOMINAL HYSTERECTOMY     bladder tack     x 2   BUNIONECTOMY     CHOLECYSTECTOMY  2009   COLONOSCOPY     scr, 12/2008 ([facility]) nl   COLOSTOMY Left 06/05/2018   Procedure: COLOSTOMY;  Surgeon: Ralene Ok, MD;  Location: [place];  Service: General;  Laterality: Left;   COLOSTOMY REVERSAL N/A 10/16/2018   Procedure: HARTMANN'S COLOSTOMY REVERSAL, RIGID PROCTOSCOPY;  Surgeon: Ralene Ok, MD;  [facility];  Service: General;  Laterality: N/A;   HAMMER TOE SURGERY     HYSTERECTOMY ABDOMINAL WITH SALPINGO-OOPHORECTOMY  2008   [place]  04/09/2019   x2   [place] N/A 04/09/2019   Procedure: INCISIONAL HERNIA REPAIR WITH MESH;  Surgeon: Ralene Ok, MD;  Location: [place];  Service: General;  Laterality: N/A;   LAPAROSCOPY N/A 10/16/2018   Procedure: LAPAROSCOPY DIAGNOSTIC WITH LYSIS OF ADHESIONS;  Surgeon: Ralene Ok, MD;  [facility];  Service: General;  Laterality: N/A;   LUNG BIOPSY     L Lower lung pulm nodules, largest 4.64m, low risk, repeat CT in 1 yr [facility] Stable tiny lung nodules, no f/u needed   LYMPH NODE DISSECTION  2008   LYSIS OF ADHESION N/A 04/09/2019   Procedure: OPEN LYSIS OF ADHESION;  Surgeon: RRalene Ok MD;  [place]  Service: General;  Laterality: N/A;   METATARSAL OSTEOTOMY     NECK SURGERY     x 2   OBLIQUE LUMBAR INTERBODY FUSION 1 LEVEL WITH PERCUTANEOUS SCREWS N/A 12/07/2021   Procedure: OBLIQUE LUMBAR INTERBODY FUSION 1 LEVEL WITH PERCUTANEOUS SCREWS (OLIF L4-5 WITH POSTERIOR SPINAL FUSION AND PEDICLE SCREWS);  Surgeon: BMelina Schools MD;  [place]  Service: Orthopedics;  Laterality: N/A;  4 HRS DR. CLARK TO DO APPROACH LEFT TAP BLOCK WITH EXPAREL 3 C-BED   PARTIAL COLECTOMY N/A 06/05/2018   Procedure: EXPLORATORY LAPAROTOMY AND PARTIAL COLECTOMY;  Surgeon: RRalene Ok  MD;  [place]  Service: General;  Laterality: N/A;   TONSILLECTOMY     UKoreaECHOCARDIOGRAPHY     with Nuclear test, Dr. SMarlou Porch Low risk   VESICOVAGINAL FISTULA CLOSURE W/ TAH     Social History:   reports that she has never smoked. She has never used smokeless tobacco. She reports current alcohol use. She reports that she does not use drugs.  Family History  Problem Relation Age of Onset   Hashimoto's thyroiditis Mother    High blood pressure Mother    Stroke Mother    Other Mother        Borderline DM   Alzheimer's disease Mother    Ulcerative colitis Father    Meniere's disease Father    Hearing loss Father    Stroke Father    Stroke Sister  Medications: Patient's Medications  New Prescriptions   No medications on file  Previous Medications   ACETAMINOPHEN (TYLENOL) 500 MG TABLET    Take 2 tablets (1,000 mg total) by mouth every 6 (six) hours as needed.   ALBUTEROL (PROVENTIL HFA;VENTOLIN HFA) 108 (90 BASE) MCG/ACT INHALER    Inhale 2 puffs into the lungs every 4 (four) hours as needed for wheezing or shortness of breath.   ARIPIPRAZOLE (ABILIFY) 2 MG TABLET    TAKE 1 TABLET BY MOUTH DAILY   ASCORBIC ACID (VITAMIN C) 1000 MG TABLET    Take 1,000 mg by mouth in the morning.   ASPIRIN EC 81 MG TABLET    Take 1 tablet (81 mg total) by mouth daily. Swallow whole.   ATORVASTATIN (LIPITOR) 10 MG TABLET    TAKE 1 TABLET BY MOUTH  DAILY   AZELAIC ACID (FINACEA EX)    Apply 1 application  topically at bedtime.   BUPROPION (WELLBUTRIN XL) 150 MG 24 HR TABLET    Take 450 mg by mouth daily.   CETIRIZINE (ZYRTEC) 10 MG TABLET    Take 10 mg by mouth at bedtime.   CYANOCOBALAMIN (,VITAMIN B-12,) 1000 MCG/ML INJECTION    Inject 1 mL (1,000 mcg total) into the muscle every 30 (thirty) days.   DOCUSATE SODIUM (COLACE) 100 MG CAPSULE    Take 100 mg by mouth 2 (two) times daily as needed for mild constipation.   DOXYCYCLINE (PERIOSTAT) 20 MG TABLET    Take 20 mg by mouth 2 (two) times  daily.   DULOXETINE (CYMBALTA) 30 MG CAPSULE    Take 90 mg by mouth at bedtime.   EPINEPHRINE 0.3 MG/0.3 ML IJ SOAJ INJECTION    Inject 0.3 mg into the muscle as needed for anaphylaxis.   FEXOFENADINE (ALLEGRA) 180 MG TABLET    Take 180 mg by mouth daily.   FLUTICASONE-SALMETEROL (ADVAIR HFA) 115-21 MCG/ACT INHALER    Inhale 2 puffs into the lungs 2 (two) times daily.   HYDROXYCHLOROQUINE (PLAQUENIL) 200 MG TABLET    Take 200 mg by mouth 2 (two) times daily with a meal.   IPRATROPIUM (ATROVENT) 0.06 % NASAL SPRAY    Place 2 sprays into both nostrils in the morning and at bedtime.   KETOTIFEN (ZADITOR) 0.025 % OPHTHALMIC SOLUTION    Place 1 drop into both eyes in the morning.   LEVOTHYROXINE SODIUM 175 MCG CAPS    Take 175 mcg by mouth daily before breakfast.   METOPROLOL SUCCINATE (TOPROL-XL) 25 MG 24 HR TABLET    TAKE 1 TABLET BY MOUTH ONCE DAILY   NITROGLYCERIN (NITROSTAT) 0.4 MG SL TABLET    Place 1 tablet (0.4 mg total) under the tongue every 5 (five) minutes as needed for chest pain.   OMALIZUMAB (XOLAIR) 150 MG INJECTION    300 mg every 30 (thirty) days. On the 13th of the month   PANTOPRAZOLE (PROTONIX) 40 MG TABLET    Take 80 mg by mouth 2 (two) times daily.   POLYETHYLENE GLYCOL (MIRALAX / GLYCOLAX) 17 G PACKET    Take 17 g by mouth daily as needed (constipation.).   PRAMIPEXOLE (MIRAPEX) 0.125 MG TABLET    Take 3 tablets (0.375 mg total) by mouth at bedtime as needed (restless legs syndrome).   PREGABALIN (LYRICA) 75 MG CAPSULE    Take 1 capsule (75 mg total) by mouth 2 (two) times daily.   PROBIOTIC PRODUCT (PROBIOTIC PO)    Take 1 capsule by mouth in the  morning.   SYRINGE, DISPOSABLE, (B-D SYRINGE LUER-LOK 3CC) 3 ML MISC      Modified Medications   No medications on file  Discontinued Medications   No medications on file    Physical Exam:  Vitals:   08/16/22 1406  BP: 130/82  Pulse: 85  Temp: (!) 96.9 F (36.1 C)  SpO2: 97%  Weight: 197 lb (89.4 kg)  Height: '5\' 5"'$   (1.651 m)   Body mass index is 32.78 kg/m. Wt Readings from Last 3 Encounters:  08/16/22 197 lb (89.4 kg)  05/22/22 191 lb (86.6 kg)  03/06/22 199 lb (90.3 kg)    Physical Exam Vitals and nursing note reviewed.  Constitutional:      Appearance: She is obese.     Comments: Classic moon facies on prednisone  Cardiovascular:     Rate and Rhythm: Normal rate and regular rhythm.  Pulmonary:     Effort: Pulmonary effort is normal.     Breath sounds: Normal breath sounds.  Abdominal:     General: Abdomen is flat. Bowel sounds are normal.  Musculoskeletal:        General: Tenderness present.  Neurological:     General: No focal deficit present.     Mental Status: She is alert and oriented to person, place, and time.  Psychiatric:        Mood and Affect: Mood normal.        Behavior: Behavior normal.     Labs reviewed: Basic Metabolic Panel: Recent Labs    12/01/21 1116  NA 141  K 4.2  CL 104  CO2 30  GLUCOSE 105*  BUN 13  CREATININE 1.17*  CALCIUM 9.4   Liver Function Tests: No results for input(s): "AST", "ALT", "ALKPHOS", "BILITOT", "PROT", "ALBUMIN" in the last 8760 hours. No results for input(s): "LIPASE", "AMYLASE" in the last 8760 hours. No results for input(s): "AMMONIA" in the last 8760 hours. CBC: Recent Labs    12/01/21 1116  WBC 9.4  HGB 13.9  HCT 42.9  MCV 94.9  PLT 434*   Lipid Panel: No results for input(s): "CHOL", "HDL", "LDLCALC", "TRIG", "CHOLHDL", "LDLDIRECT" in the last 8760 hours. TSH: No results for input(s): "TSH" in the last 8760 hours. A1C: Lab Results  Component Value Date   HGBA1C 5.6 10/14/2018     Assessment/Plan  1. Vasculitis of skin Per dermatology continue prednisone and antihistamines considering use of newer biologic agent to get her off    3. Screening for diabetes mellitus Given higher doses of prednisone would like to screen her for diabetes    5. Fibromyalgia She will taper off of bupropion but  continue duloxetine but increase dose from 90 to 120 mg    Alain Honey, MD [place] [phone]

## 2022-08-17 LAB — HEMOGLOBIN A1C
Hgb A1c MFr Bld: 6.3 % of total Hgb — ABNORMAL HIGH (ref <5.7)
Mean Plasma Glucose: 134 mg/dL
eAG (mmol/L): 7.4 mmol/L

## 2022-08-23 ENCOUNTER — Other Ambulatory Visit: Payer: Self-pay | Admitting: Cardiology

## 2022-08-28 ENCOUNTER — Encounter: Payer: Self-pay | Admitting: Adult Health

## 2022-08-28 ENCOUNTER — Ambulatory Visit (INDEPENDENT_AMBULATORY_CARE_PROVIDER_SITE_OTHER): Payer: Medicare Other | Admitting: Adult Health

## 2022-08-28 VITALS — BP 135/81 | HR 82 | Temp 96.1°F | Ht 65.0 in | Wt 194.6 lb

## 2022-08-28 DIAGNOSIS — J019 Acute sinusitis, unspecified: Secondary | ICD-10-CM | POA: Diagnosis not present

## 2022-08-28 DIAGNOSIS — B9689 Other specified bacterial agents as the cause of diseases classified elsewhere: Secondary | ICD-10-CM | POA: Diagnosis not present

## 2022-08-28 LAB — POC COVID19 BINAXNOW: SARS Coronavirus 2 Ag: NEGATIVE

## 2022-08-28 MED ORDER — BENZONATATE 100 MG PO CAPS: 100.0000 mg | ORAL_CAPSULE | Freq: Three times a day (TID) | ORAL | 0 refills | Status: AC | PRN

## 2022-08-28 MED ORDER — DOXYCYCLINE HYCLATE 100 MG PO TABS
100.0000 mg | ORAL_TABLET | Freq: Two times a day (BID) | ORAL | 0 refills | Status: AC
Start: 2022-08-28 — End: 2022-09-07

## 2022-08-28 NOTE — Patient Instructions (Signed)

## 2022-08-28 NOTE — Progress Notes (Signed)
[facility] clinic  Provider:  Durenda Age DNP  Code Status:  Full Code  Goals of Care:     12/07/2021    7:09 AM  Advanced Directives  Does Patient Have a Medical Advance Directive? Yes     Chief Complaint  Patient presents with   Acute Visit    URI symptoms, congestionhead, chest ears, fever off and on,coughing, sneezing.    HPI: Patient is a 66 y.o. female seen today for an acute visit for URI symptoms. She has a PMH of fibromyalgia, endometrial cancer, chronic fatigue syndrome and atrial tachycardia. She had sore throat, coughing and sneezing 8 days ago. She now presents with frontal sinus tenderness, on and off low grade fever (100.2), productive cough with whitish to yellowish phlegm and shortness of breath at night.. She has 3 COVID-19 vaccines.   Today, she tested negative for COVID-19 at the clinic. Frontal head sinuses were tender.  Past Medical History:  Diagnosis Date   Asthma    Back pain    Chronic fatigue syndrome    Dr. Sabra Heck   Complication of anesthesia    Cough    /SOB, PFT's nl, improved with Bronchodilation 8/11   Dysrhythmia    Endometrial cancer ([place])    Esophageal spasm    GERD Dr. Sabra Heck, Dr. Wynetta Emery; egd 2006 nl; UGI 3/13 Small HH, moderate GERD, nl motility; seen at [facility] ([place]), egd? neg, sone improvement on sucralfate susp   Fibromyalgia    Foot pain    Dr. Rushie Nyhan   Hashimoto's thyroiditis    High triglycerides    Hypothyroidism    Dr. Lewis Shock 4/14   Leukocytoclastic vasculitis ([place])    Dr. Tonia Brooms   Low HDL (under 40)    Memory loss    Mitral valve prolapse    Paroxysmal atrial tachycardia    Dr. Marlou Porch   Patent foramen ovale    Pernicious anemia    Dr. Sabra Heck   PONV (postoperative nausea and vomiting)    along time ago had nausea ans vomiting after surgery-25 years ago   Reflux    ? delayed gastric emptying--GES nl 01/2012 (6% at 2hrs)   RLS (restless legs syndrome)    low ferritin Dr. Sabra Heck   Rosacea    Sleep apnea     Uterine carcinoma [facility])    Dr. Polly Cobia    Past Surgical History:  Procedure Laterality Date   ABDOMINAL EXPOSURE N/A 12/07/2021   Procedure: ABDOMINAL EXPOSURE;  Surgeon: Marty Heck, MD;  Location: [place];  Service: Vascular;  Laterality: N/A;   ABDOMINAL HYSTERECTOMY     bladder tack     x 2   BUNIONECTOMY     CHOLECYSTECTOMY  2009   COLONOSCOPY     scr, 12/2008 ([facility]) nl   COLOSTOMY Left 06/05/2018   Procedure: COLOSTOMY;  Surgeon: Ralene Ok, MD;  Location: [place];  Service: General;  Laterality: Left;   COLOSTOMY REVERSAL N/A 10/16/2018   Procedure: HARTMANN'S COLOSTOMY REVERSAL, RIGID PROCTOSCOPY;  Surgeon: Ralene Ok, MD;  [facility];  Service: General;  Laterality: N/A;   HAMMER TOE SURGERY     HYSTERECTOMY ABDOMINAL WITH SALPINGO-OOPHORECTOMY  2008   [place]  04/09/2019   x2   [place] N/A 04/09/2019   Procedure: INCISIONAL HERNIA REPAIR WITH MESH;  Surgeon: Ralene Ok, MD;  Location: [place];  Service: General;  Laterality: N/A;   LAPAROSCOPY N/A 10/16/2018   Procedure: LAPAROSCOPY DIAGNOSTIC WITH LYSIS OF ADHESIONS;  Surgeon:  Ralene Ok, MD;  [facility];  Service: General;  Laterality: N/A;   LUNG BIOPSY     L Lower lung pulm nodules, largest 4.57m, low risk, repeat CT in 1 yr [facility] Stable tiny lung nodules, no f/u needed   LYMPH NODE DISSECTION  2008   LYSIS OF ADHESION N/A 04/09/2019   Procedure: OPEN LYSIS OF ADHESION;  Surgeon: RRalene Ok MD;  [place]  Service: General;  Laterality: N/A;   METATARSAL OSTEOTOMY     NECK SURGERY     x 2   OBLIQUE LUMBAR INTERBODY FUSION 1 LEVEL WITH PERCUTANEOUS SCREWS N/A 12/07/2021   Procedure: OBLIQUE LUMBAR INTERBODY FUSION 1 LEVEL WITH PERCUTANEOUS SCREWS (OLIF L4-5 WITH POSTERIOR SPINAL FUSION AND PEDICLE SCREWS);  Surgeon: BMelina Schools MD;  [place]  Service: Orthopedics;  Laterality: N/A;  4  HRS DR. CLARK TO DO APPROACH LEFT TAP BLOCK WITH EXPAREL 3 C-BED   PARTIAL COLECTOMY N/A 06/05/2018   Procedure: EXPLORATORY LAPAROTOMY AND PARTIAL COLECTOMY;  Surgeon: RRalene Ok MD;  [place]  Service: General;  Laterality: N/A;   TONSILLECTOMY     UKoreaECHOCARDIOGRAPHY     with Nuclear test, Dr. SMarlou Porch Low risk   VESICOVAGINAL FISTULA CLOSURE W/ TAH      Allergies  Allergen Reactions   Aspirin Anaphylaxis    In higher doses   Bee Venom Shortness Of Breath, Itching, Swelling and Hives   Peanut-Containing Drug Products Anaphylaxis   Ambien [Zolpidem Tartrate] Other (See Comments)    Hallucinations   Tape Itching, Swelling and Rash   Ciprofloxacin Nausea And Vomiting   Latex Rash   Minocycline Rash   Shellfish Allergy Rash    Outpatient Encounter Medications as of 08/28/2022  Medication Sig   benzonatate (TESSALON PERLES) 100 MG capsule Take 1 capsule (100 mg total) by mouth 3 (three) times daily as needed for up to 14 days for cough.   doxycycline (VIBRA-TABS) 100 MG tablet Take 1 tablet (100 mg total) by mouth 2 (two) times daily for 10 days.   acetaminophen (TYLENOL) 500 MG tablet Take 2 tablets (1,000 mg total) by mouth every 6 (six) hours as needed.   albuterol (PROVENTIL HFA;VENTOLIN HFA) 108 (90 Base) MCG/ACT inhaler Inhale 2 puffs into the lungs every 4 (four) hours as needed for wheezing or shortness of breath.   ARIPiprazole (ABILIFY) 2 MG tablet TAKE 1 TABLET BY MOUTH DAILY   Ascorbic Acid (VITAMIN C) 1000 MG tablet Take 1,000 mg by mouth in the morning.   aspirin EC 81 MG tablet Take 1 tablet (81 mg total) by mouth daily. Swallow whole.   atorvastatin (LIPITOR) 10 MG tablet TAKE 1 TABLET BY MOUTH DAILY   Azelaic Acid (FINACEA EX) Apply 1 application  topically at bedtime.   buPROPion (WELLBUTRIN XL) 150 MG 24 hr tablet Take 450 mg by mouth daily.   cetirizine (ZYRTEC) 10 MG tablet Take 10 mg by mouth at bedtime.   cyanocobalamin (,VITAMIN B-12,) 1000  MCG/ML injection Inject 1 mL (1,000 mcg total) into the muscle every 30 (thirty) days.   docusate sodium (COLACE) 100 MG capsule Take 100 mg by mouth 2 (two) times daily as needed for mild constipation.   doxycycline (PERIOSTAT) 20 MG tablet Take 20 mg by mouth 2 (two) times daily as needed.   DULoxetine (CYMBALTA) 30 MG capsule Take 90 mg by mouth at bedtime.   EPINEPHrine 0.3 mg/0.3 mL IJ SOAJ injection Inject 0.3  mg into the muscle as needed for anaphylaxis.   fexofenadine (ALLEGRA) 180 MG tablet Take 180 mg by mouth daily.   fluticasone-salmeterol (ADVAIR HFA) 115-21 MCG/ACT inhaler Inhale 2 puffs into the lungs 2 (two) times daily.   hydroxychloroquine (PLAQUENIL) 200 MG tablet Take 200 mg by mouth 2 (two) times daily with a meal.   ipratropium (ATROVENT) 0.06 % nasal spray Place 2 sprays into both nostrils in the morning and at bedtime.   ketotifen (ZADITOR) 0.025 % ophthalmic solution Place 1 drop into both eyes in the morning.   Levothyroxine Sodium 175 MCG CAPS Take 175 mcg by mouth daily before breakfast.   metoprolol succinate (TOPROL-XL) 25 MG 24 hr tablet TAKE 1 TABLET BY MOUTH ONCE  DAILY   nitroGLYCERIN (NITROSTAT) 0.4 MG SL tablet Place 1 tablet (0.4 mg total) under the tongue every 5 (five) minutes as needed for chest pain.   omalizumab (XOLAIR) 150 MG injection 300 mg every 30 (thirty) days. On the 13th of the month   pantoprazole (PROTONIX) 40 MG tablet Take 80 mg by mouth 2 (two) times daily.   polyethylene glycol (MIRALAX / GLYCOLAX) 17 g packet Take 17 g by mouth daily as needed (constipation.).   pramipexole (MIRAPEX) 0.125 MG tablet Take 3 tablets (0.375 mg total) by mouth at bedtime as needed (restless legs syndrome).   pregabalin (LYRICA) 75 MG capsule Take 1 capsule (75 mg total) by mouth 2 (two) times daily.   Probiotic Product (PROBIOTIC PO) Take 1 capsule by mouth in the morning.   Syringe, Disposable, (B-D SYRINGE LUER-LOK 3CC) 3 ML MISC    No facility-administered  encounter medications on file as of 08/28/2022.    Review of Systems:  Review of Systems  Constitutional:  Positive for fever. Negative for appetite change, chills and fatigue.  HENT:  Positive for sinus pressure, sinus pain and sneezing. Negative for congestion, hearing loss, rhinorrhea and sore throat.   Eyes: Negative.   Respiratory:  Positive for cough. Negative for shortness of breath and wheezing.   Cardiovascular:  Negative for chest pain, palpitations and leg swelling.  Gastrointestinal:  Negative for abdominal pain, constipation, diarrhea, nausea and vomiting.  Genitourinary:  Negative for dysuria.  Musculoskeletal:  Negative for arthralgias, back pain and myalgias.  Skin:  Negative for color change, rash and wound.  Neurological:  Negative for dizziness, weakness and headaches.  Psychiatric/Behavioral:  Negative for behavioral problems. The patient is not nervous/anxious.     Health Maintenance  Topic Date Due   COLONOSCOPY (Pts 45-25yr Insurance coverage will need to be confirmed)  Never done   MAMMOGRAM  Never done   DEXA SCAN  Never done   Zoster Vaccines- Shingrix (2 of 2) 04/27/2020   Medicare Annual Wellness (AWV)  03/09/2022   Pneumonia Vaccine 68 Years old (2 - PCV) 08/22/2022   COVID-19 Vaccine (7 - Pfizer risk series) 09/22/2022   TETANUS/TDAP  02/05/2028   INFLUENZA VACCINE  Completed   Hepatitis C Screening  Completed   HPV VACCINES  Aged Out    Physical Exam: Vitals:   08/28/22 1313  BP: 135/81  Pulse: 82  Temp: (!) 96.1 F (35.6 C)  SpO2: 94%  Weight: 194 lb 9.6 oz (88.3 kg)  Height: '5\' 5"'$  (1.651 m)   Body mass index is 32.38 kg/m. Physical Exam Constitutional:      General: She is not in acute distress.    Appearance: She is obese.  HENT:     Head: Normocephalic and atraumatic.  Nose: Nose normal.     Mouth/Throat:     Mouth: Mucous membranes are moist.  Eyes:     Conjunctiva/sclera: Conjunctivae normal.  Cardiovascular:     Rate  and Rhythm: Normal rate and regular rhythm.  Pulmonary:     Effort: Pulmonary effort is normal.     Breath sounds: Normal breath sounds.  Abdominal:     General: Bowel sounds are normal.     Palpations: Abdomen is soft.  Musculoskeletal:        General: Normal range of motion.     Cervical back: Normal range of motion.  Skin:    General: Skin is warm and dry.  Neurological:     General: No focal deficit present.     Mental Status: She is alert and oriented to person, place, and time.  Psychiatric:        Mood and Affect: Mood normal.        Behavior: Behavior normal.        Thought Content: Thought content normal.        Judgment: Judgment normal.     Labs reviewed: Basic Metabolic Panel: Recent Labs    12/01/21 1116  NA 141  K 4.2  CL 104  CO2 30  GLUCOSE 105*  BUN 13  CREATININE 1.17*  CALCIUM 9.4   Liver Function Tests: No results for input(s): "AST", "ALT", "ALKPHOS", "BILITOT", "PROT", "ALBUMIN" in the last 8760 hours. No results for input(s): "LIPASE", "AMYLASE" in the last 8760 hours. No results for input(s): "AMMONIA" in the last 8760 hours. CBC: Recent Labs    12/01/21 1116  WBC 9.4  HGB 13.9  HCT 42.9  MCV 94.9  PLT 434*   Lipid Panel: No results for input(s): "CHOL", "HDL", "LDLCALC", "TRIG", "CHOLHDL", "LDLDIRECT" in the last 8760 hours. Lab Results  Component Value Date   HGBA1C 6.3 (H) 08/16/2022    Procedures since last visit: No results found.  Assessment/Plan  1. Acute bacterial rhinosinusitis - POC COVID-19 was negative  - doxycycline (VIBRA-TABS) 100 MG tablet; Take 1 tablet (100 mg total) by mouth 2 (two) times daily for 10 days.  Dispense: 20 tablet; Refill: 0 - benzonatate (TESSALON PERLES) 100 MG capsule; Take 1 capsule (100 mg total) by mouth 3 (three) times daily as needed for up to 14 days for cough.  Dispense: 30 capsule; Refill: 0   Labs/tests ordered:   POC COVID-19   Next appt:  as needed

## 2022-09-15 DIAGNOSIS — J42 Unspecified chronic bronchitis: Secondary | ICD-10-CM | POA: Diagnosis not present

## 2022-09-15 DIAGNOSIS — J0381 Acute recurrent tonsillitis due to other specified organisms: Secondary | ICD-10-CM | POA: Diagnosis not present

## 2022-09-15 DIAGNOSIS — Z831 Family history of other infectious and parasitic diseases: Secondary | ICD-10-CM | POA: Diagnosis not present

## 2022-09-26 DIAGNOSIS — L501 Idiopathic urticaria: Secondary | ICD-10-CM | POA: Diagnosis not present

## 2022-10-04 DIAGNOSIS — M25572 Pain in left ankle and joints of left foot: Secondary | ICD-10-CM | POA: Diagnosis not present

## 2022-10-04 DIAGNOSIS — M25571 Pain in right ankle and joints of right foot: Secondary | ICD-10-CM | POA: Diagnosis not present

## 2022-10-04 DIAGNOSIS — M67961 Unspecified disorder of synovium and tendon, right lower leg: Secondary | ICD-10-CM | POA: Diagnosis not present

## 2022-10-04 DIAGNOSIS — M67962 Unspecified disorder of synovium and tendon, left lower leg: Secondary | ICD-10-CM | POA: Diagnosis not present

## 2022-10-27 ENCOUNTER — Encounter: Payer: Self-pay | Admitting: Family

## 2022-10-27 ENCOUNTER — Ambulatory Visit (INDEPENDENT_AMBULATORY_CARE_PROVIDER_SITE_OTHER): Payer: Medicare Other | Admitting: Family

## 2022-10-27 VITALS — BP 140/60 | HR 93 | Temp 97.0°F | Resp 16 | Ht 65.0 in | Wt 185.0 lb

## 2022-10-27 DIAGNOSIS — R0981 Nasal congestion: Secondary | ICD-10-CM

## 2022-10-27 DIAGNOSIS — R059 Cough, unspecified: Secondary | ICD-10-CM | POA: Diagnosis not present

## 2022-10-27 DIAGNOSIS — R0989 Other specified symptoms and signs involving the circulatory and respiratory systems: Secondary | ICD-10-CM | POA: Diagnosis not present

## 2022-10-27 DIAGNOSIS — J0101 Acute recurrent maxillary sinusitis: Secondary | ICD-10-CM | POA: Diagnosis not present

## 2022-10-27 DIAGNOSIS — R509 Fever, unspecified: Secondary | ICD-10-CM | POA: Diagnosis not present

## 2022-10-27 LAB — POCT INFLUENZA A/B
Influenza A, POC: NEGATIVE
Influenza B, POC: NEGATIVE

## 2022-10-27 LAB — POC COVID19 BINAXNOW: SARS Coronavirus 2 Ag: NEGATIVE

## 2022-10-27 MED ORDER — BENZONATATE 100 MG PO CAPS: 100.0000 mg | ORAL_CAPSULE | Freq: Two times a day (BID) | ORAL | 0 refills | Status: DC | PRN

## 2022-10-27 MED ORDER — AZITHROMYCIN 250 MG PO TABS: ORAL_TABLET | ORAL | 0 refills | Status: AC

## 2022-10-27 NOTE — Progress Notes (Signed)
Provider: Tauren Delbuono FNP-C  Jill Honour, MD  Patient Care Team: Jill Honour, MD as PCP - General (Family Medicine) Jerline Pain, MD as PCP - Cardiology (Cardiology) Warren Danes, PA-C as Physician Assistant (Dermatology) Tiajuana Amass, MD as Referring Physician (Allergy and Immunology) Ronald Lobo, MD as Consulting Physician (Gastroenterology) Ralene Ok, MD as Consulting Physician (General Surgery) Melina Schools, MD as Consulting Physician (Orthopedic Surgery) Baxter Kail, MD (Internal Medicine) Paulla Dolly Tamala Fothergill, DPM as Consulting Physician (Podiatry) Jacelyn Pi, MD as Referring Physician (Endocrinology) Sanjuana Kava, MD (Inactive) as Referring Physician (Obstetrics and Gynecology) Fonnie Mu, [name] (Optometry) Rana Snare, MD (Inactive) (Urology) Blima Dessert, MD as Referring Physician (Rheumatology)  Extended Emergency Contact Information Primary Emergency Contact: [name] Address: [place],  [address] [place] Phone: [phone] Mobile Phone: [phone] Relation: Spouse  Code Status:  Full Code  Goals of care: Advanced Directive information    10/27/2022   11:18 AM  Advanced Directives  Does Patient Have a Medical Advance Directive? Yes  Type of Paramedic of [place];Living will;Out of facility DNR (pink MOST or yellow form)  Does patient want to make changes to medical advance directive? No - Patient declined  Copy of [place] in Chart? Yes - validated most recent copy scanned in chart (See row information)     Chief Complaint  Patient presents with   Acute Visit    Patient complains of head congestion, chest congestion, and coughing for weeks.     HPI:  Pt is a 66 y.o. female seen today for an acute visit for evaluation of cough,congestion ,headache for several weeks.Has been feeling achy and fatigue. Sometimes coughs up  green mucus.Has fever described as tactile fever. Has taken Nytquail and dayQuil and tylenol for pain.Took two tylenol this morning around 9:30 am. Feels like she can't catch her breath when she lies down.Has had to sleep when sitting up in the recliner.  Husband had similar symptoms but got over it.took OTC    Past Medical History:  Diagnosis Date   Asthma    Back pain    Chronic fatigue syndrome    Dr. Sabra Heck   Complication of anesthesia    Cough    /SOB, PFT's nl, improved with Bronchodilation 8/11   Dysrhythmia    Endometrial cancer ([place])    Esophageal spasm    GERD Dr. Sabra Heck, Dr. Wynetta Emery; egd 2006 nl; UGI 3/13 [facility], moderate GERD, nl motility; seen at [facility] egd? neg, sone improvement on sucralfate susp   Fibromyalgia    Foot pain    Dr. Rushie Nyhan   Hashimoto's thyroiditis    High triglycerides    Hypothyroidism    Dr. Lewis Shock 4/14   Leukocytoclastic vasculitis ([place])    Dr. Tonia Brooms   Low HDL (under 67)    Memory loss    Mitral valve prolapse    Paroxysmal atrial tachycardia    Dr. Marlou Porch   Patent foramen ovale    Pernicious anemia    Dr. Sabra Heck   PONV (postoperative nausea and vomiting)    along time ago had nausea ans vomiting after surgery-25 years ago   Reflux    ? delayed gastric emptying--GES nl 01/2012 (6% at 2hrs)   RLS (restless legs syndrome)    low ferritin Dr. Sabra Heck   Rosacea    Sleep apnea    Uterine carcinoma ([place])  Dr. Polly Cobia   Past Surgical History:  Procedure Laterality Date   ABDOMINAL EXPOSURE N/A 12/07/2021   Procedure: ABDOMINAL EXPOSURE;  Surgeon: Marty Heck, MD;  Location: [facility];  Service: Vascular;  Laterality: N/A;   ABDOMINAL HYSTERECTOMY     bladder tack     x 2   BUNIONECTOMY     CHOLECYSTECTOMY  2009   COLONOSCOPY     scr, 12/2008 ([facility]) nl   COLOSTOMY Left 06/05/2018   Procedure: COLOSTOMY;  Surgeon: Ralene Ok, MD;  Location: [facility];  Service: General;  Laterality: Left;   COLOSTOMY REVERSAL N/A  10/16/2018   Procedure: HARTMANN'S COLOSTOMY REVERSAL, RIGID PROCTOSCOPY;  Surgeon: Ralene Ok, MD;  Location: [facility];  Service: General;  Laterality: N/A;   HAMMER TOE SURGERY     HYSTERECTOMY ABDOMINAL WITH SALPINGO-OOPHORECTOMY  2008   [place]  04/09/2019   x2   [place] N/A 04/09/2019   Procedure: INCISIONAL HERNIA REPAIR WITH MESH;  Surgeon: Ralene Ok, MD;  Location: [facility];  Service: General;  Laterality: N/A;   LAPAROSCOPY N/A 10/16/2018   Procedure: LAPAROSCOPY DIAGNOSTIC WITH LYSIS OF ADHESIONS;  Surgeon: Ralene Ok, MD;  Location: [facility];  Service: General;  Laterality: N/A;   LUNG BIOPSY     L Lower lung pulm nodules, largest 4.40m, low risk, repeat CT in 1 yr now [facility] Stable tiny lung nodules, no f/u needed   LYMPH NODE DISSECTION  2008   LYSIS OF ADHESION N/A 04/09/2019   Procedure: OPEN LYSIS OF ADHESION;  Surgeon: RRalene Ok MD;  Location[facility]  Service: General;  Laterality: N/A;   METATARSAL OSTEOTOMY     NECK SURGERY     x 2   OBLIQUE LUMBAR INTERBODY FUSION 1 LEVEL WITH PERCUTANEOUS SCREWS N/A 12/07/2021   Procedure: OBLIQUE LUMBAR INTERBODY FUSION 1 LEVEL WITH PERCUTANEOUS SCREWS (OLIF L4-5 WITH POSTERIOR SPINAL FUSION AND PEDICLE SCREWS);  Surgeon: BMelina Schools MD;  [facility]  Service: Orthopedics;  Laterality: N/A;  4 HRS DR. CLARK TO DO APPROACH LEFT TAP BLOCK WITH EXPAREL 3 C-BED   PARTIAL COLECTOMY N/A 06/05/2018   Procedure: EXPLORATORY LAPAROTOMY AND PARTIAL COLECTOMY;  Surgeon: RRalene Ok MD;  [facility]  Service: General;  Laterality: N/A;   TONSILLECTOMY     [place]ARD[place]     with Nuclear test, Dr. SMarlou Porch Low risk   VESICOVAGINAL FISTULA CLOSURE W/ TAH      Allergies  Allergen Reactions   Aspirin Anaphylaxis    In higher doses   Bee Venom Shortness Of Breath, Itching, Swelling and Hives   Peanut-Containing Drug Products Anaphylaxis  and Other (See Comments)   Ambien [Zolpidem Tartrate] Other (See Comments)    Hallucinations   Tape Itching, Swelling and Rash   Ciprofloxacin Nausea And Vomiting   Latex Rash   Minocycline Rash   Shellfish Allergy Rash    Outpatient Encounter Medications as of 10/27/2022  Medication Sig   acetaminophen (TYLENOL) 500 MG tablet Take 2 tablets (1,000 mg total) by mouth every 6 (six) hours as needed.   albuterol (PROVENTIL HFA;VENTOLIN HFA) 108 (90 Base) MCG/ACT inhaler Inhale 2 puffs into the lungs every 4 (four) hours as needed for wheezing or shortness of breath.   ARIPiprazole (ABILIFY) 2 MG tablet TAKE 1 TABLET BY MOUTH DAILY   Ascorbic Acid (VITAMIN C) 1000 MG tablet Take 1,000 mg by mouth in the morning.   aspirin EC 81 MG tablet Take  1 tablet (81 mg total) by mouth daily. Swallow whole.   atorvastatin (LIPITOR) 10 MG tablet TAKE 1 TABLET BY MOUTH DAILY   Azelaic Acid (FINACEA EX) Apply 1 application  topically at bedtime.   buPROPion (WELLBUTRIN XL) 150 MG 24 hr tablet Take 450 mg by mouth daily.   cetirizine (ZYRTEC) 10 MG tablet Take 10 mg by mouth at bedtime.   cyanocobalamin (,VITAMIN B-12,) 1000 MCG/ML injection Inject 1 mL (1,000 mcg total) into the muscle every 30 (thirty) days.   docusate sodium (COLACE) 100 MG capsule Take 100 mg by mouth 2 (two) times daily as needed for mild constipation.   doxycycline (PERIOSTAT) 20 MG tablet Take 20 mg by mouth 2 (two) times daily as needed.   DULoxetine (CYMBALTA) 30 MG capsule Take 90 mg by mouth at bedtime.   EPINEPHrine 0.3 mg/0.3 mL IJ SOAJ injection Inject 0.3 mg into the muscle as needed for anaphylaxis.   fexofenadine (ALLEGRA) 180 MG tablet Take 180 mg by mouth daily.   fluticasone-salmeterol (ADVAIR HFA) 115-21 MCG/ACT inhaler Inhale 2 puffs into the lungs 2 (two) times daily.   hydroxychloroquine (PLAQUENIL) 200 MG tablet Take 200 mg by mouth 2 (two) times daily with a meal.   ipratropium (ATROVENT) 0.06 % nasal spray Place 2  sprays into both nostrils in the morning and at bedtime.   ketotifen (ZADITOR) 0.025 % ophthalmic solution Place 1 drop into both eyes in the morning.   Levothyroxine Sodium 175 MCG CAPS Take 175 mcg by mouth daily before breakfast.   metoprolol succinate (TOPROL-XL) 25 MG 24 hr tablet TAKE 1 TABLET BY MOUTH ONCE  DAILY   nitroGLYCERIN (NITROSTAT) 0.4 MG SL tablet Place 1 tablet (0.4 mg total) under the tongue every 5 (five) minutes as needed for chest pain.   omalizumab (XOLAIR) 150 MG injection 300 mg every 30 (thirty) days. On the 13th of the month   pantoprazole (PROTONIX) 40 MG tablet Take 80 mg by mouth 2 (two) times daily.   polyethylene glycol (MIRALAX / GLYCOLAX) 17 g packet Take 17 g by mouth daily as needed (constipation.).   pramipexole (MIRAPEX) 0.125 MG tablet Take 3 tablets (0.375 mg total) by mouth at bedtime as needed (restless legs syndrome).   pregabalin (LYRICA) 75 MG capsule Take 1 capsule (75 mg total) by mouth 2 (two) times daily.   Probiotic Product (PROBIOTIC PO) Take 1 capsule by mouth in the morning.   Syringe, Disposable, (B-D SYRINGE LUER-LOK 3CC) 3 ML MISC    No facility-administered encounter medications on file as of 10/27/2022.    Review of Systems  Constitutional:  Positive for appetite change, fatigue and fever. Negative for chills and unexpected weight change.  HENT:  Positive for congestion, postnasal drip, rhinorrhea, sinus pressure, sinus pain and sore throat. Negative for dental problem, ear discharge, ear pain, hearing loss, nosebleeds, sneezing, tinnitus and trouble swallowing.   Eyes:  Negative for pain, discharge, redness, itching and visual disturbance.  Respiratory:  Positive for cough. Negative for chest tightness, shortness of breath and wheezing.   Cardiovascular:  Negative for chest pain, palpitations and leg swelling.  Gastrointestinal:  Negative for abdominal distention, abdominal pain, blood in stool, constipation, diarrhea, nausea and  vomiting.  Genitourinary:  Negative for difficulty urinating, dysuria, flank pain, frequency and urgency.  Musculoskeletal:  Negative for arthralgias, back pain, gait problem, joint swelling and myalgias.  Skin:  Negative for color change, pallor and rash.  Neurological:  Positive for headaches. Negative for dizziness and light-headedness.  Immunization History  Administered Date(s) Administered   Fluad Quad(high Dose 65+) 08/02/2021, 07/28/2022   Influenza, High Dose Seasonal PF 09/24/2017, 11/24/2019, 08/16/2020, 08/22/2021   Influenza-Unspecified 04/29/2013, 08/01/2016, 07/29/2017   PFIZER Comirnaty(Gray Top)Covid-19 Tri-Sucrose Vaccine 02/25/2021   PFIZER(Purple Top)SARS-COV-2 Vaccination 12/05/2019, 12/30/2019, 08/16/2020, 08/22/2021   Pfizer Covid-19 Vaccine Bivalent Booster 43yr & up 07/28/2022   Pneumococcal Polysaccharide-23 09/24/2017, 04/11/2019, 11/24/2019, 08/16/2020, 08/22/2021   Pneumococcal-Unspecified 04/30/2011   Td 02/04/2018   Td (Adult), 2 Lf Tetanus Toxid, Preservative Free 02/04/2018   Zoster Recombinat (Shingrix) 03/02/2020   Zoster, Live 03/01/2020, 06/01/2020   Pertinent  Health Maintenance Due  Topic Date Due   COLONOSCOPY (Pts 45-411yrInsurance coverage will need to be confirmed)  Never done   MAMMOGRAM  Never done   DEXA SCAN  Never done   INFLUENZA VACCINE  Completed      12/07/2021    7:12 AM 12/07/2021    4:34 PM 12/07/2021    7:25 PM 12/08/2021    7:40 AM 10/27/2022   11:18 AM  Fall Risk  Falls in the past year?     0  Was there an injury with Fall?     0  Fall Risk Category Calculator     0  Fall Risk Category     Low  Patient Fall Risk Level High fall risk Moderate fall risk Moderate fall risk Moderate fall risk Low fall risk  Patient at Risk for Falls Due to     No Fall Risks  Fall risk Follow up     Falls evaluation completed   Functional Status Survey:    Vitals:   10/27/22 1114  BP: (!) 140/60  Pulse: 93  Resp: 16  Temp: (!) 97  F (36.1 C)  SpO2: 95%  Weight: 185 lb (83.9 kg)  Height: '5\' 5"'$  (1.651 m)   Body mass index is 30.79 kg/m. Physical Exam Vitals reviewed.  Constitutional:      General: She is not in acute distress.    Appearance: Normal appearance. She is obese. She is not ill-appearing or diaphoretic.  HENT:     Head: Normocephalic.     Right Ear: Tympanic membrane, ear canal and external ear normal. There is no impacted cerumen.     Left Ear: Tympanic membrane, ear canal and external ear normal. There is no impacted cerumen.     Nose: No congestion or rhinorrhea.     Right Turbinates: Not enlarged, swollen or pale.     Left Turbinates: Not enlarged, swollen or pale.     Right Sinus: Maxillary sinus tenderness and frontal sinus tenderness present.     Left Sinus: Maxillary sinus tenderness and frontal sinus tenderness present.     Mouth/Throat:     Mouth: Mucous membranes are moist.     Pharynx: Oropharynx is clear. No oropharyngeal exudate or posterior oropharyngeal erythema.  Eyes:     General: No scleral icterus.       Right eye: No discharge.        Left eye: No discharge.     Extraocular Movements: Extraocular movements intact.     Conjunctiva/sclera: Conjunctivae normal.     Pupils: Pupils are equal, round, and reactive to light.  Neck:     Vascular: No carotid bruit.  Cardiovascular:     Rate and Rhythm: Normal rate and regular rhythm.     Pulses: Normal pulses.     Heart sounds: Normal heart sounds. No murmur heard.    No friction rub.  No gallop.  Pulmonary:     Effort: Pulmonary effort is normal. No respiratory distress.     Breath sounds: Normal breath sounds. No wheezing, rhonchi or rales.  Chest:     Chest wall: No tenderness.  Abdominal:     General: Bowel sounds are normal. There is no distension.     Palpations: Abdomen is soft. There is no mass.     Tenderness: There is no abdominal tenderness. There is no right CVA tenderness, left CVA tenderness, guarding or rebound.   Musculoskeletal:        General: No swelling or tenderness. Normal range of motion.     Cervical back: Normal range of motion. No rigidity or tenderness.     Right lower leg: No edema.     Left lower leg: No edema.  Lymphadenopathy:     Cervical: No cervical adenopathy.  Skin:    General: Skin is warm and dry.     Coloration: Skin is not pale.     Findings: No erythema or rash.  Neurological:     Mental Status: She is alert and oriented to person, place, and time.     Motor: No weakness.     Gait: Gait normal.  Psychiatric:        Mood and Affect: Mood normal.        Speech: Speech normal.        Behavior: Behavior normal.     Labs reviewed: Recent Labs    12/01/21 1116  NA 141  K 4.2  CL 104  CO2 30  GLUCOSE 105*  BUN 13  CREATININE 1.17*  CALCIUM 9.4   No results for input(s): "AST", "ALT", "ALKPHOS", "BILITOT", "PROT", "ALBUMIN" in the last 8760 hours. Recent Labs    12/01/21 1116  WBC 9.4  HGB 13.9  HCT 42.9  MCV 94.9  PLT 434*   No results found for: "TSH" Lab Results  Component Value Date   HGBA1C 6.3 (H) 08/16/2022   Lab Results  Component Value Date   CHOL 132 05/23/2016   HDL 46 05/23/2016   LDLCALC 40 05/23/2016   TRIG 195 (H) 06/10/2018   CHOLHDL 2.9 05/23/2016    Significant Diagnostic Results in last 30 days:  No results found.  Assessment/Plan  1. Cough, unspecified type Afebrile  - bilateral lungs clear  - encouraged to increase fluid intake  - start on Benzonatate for cough  - POC COVID-19 test result negative  - POC Influenza A/B results negative   2. Chest congestion - start on Benzonatate for cough  - POC COVID-19 - POC Influenza A/B  3.maxillary sinusitis  Bilateral maxillary sinuses tender to percussion  Will start on Z-pak which has been effective in the past when she had similar infection  - POC COVID-19 - POC Influenza A/B  4. Chills (without fever) OTC tylenol as needed for fever or chills    5. Fever,  unspecified fever cause Afebrile during visit but describes tactile fever - advised to take OTC Tylenol as needed for fever or chills or body aches  - POC COVID-19 - POC Influenza A/B  Family/ staff Communication: Reviewed plan of care with patient verbalized understanding   Labs/tests ordered:  - POC COVID-19 - POC Influenza A/B  Next Appointment:Return if symptoms worsen or fail to improve.    Jill Hughs, NP

## 2022-10-27 NOTE — Patient Instructions (Signed)
-   Notify provider if symptoms worsen or fail to improve  °

## 2022-11-02 DIAGNOSIS — M79672 Pain in left foot: Secondary | ICD-10-CM | POA: Diagnosis not present

## 2022-11-02 DIAGNOSIS — M79671 Pain in right foot: Secondary | ICD-10-CM | POA: Diagnosis not present

## 2022-11-07 DIAGNOSIS — M79671 Pain in right foot: Secondary | ICD-10-CM | POA: Diagnosis not present

## 2022-11-07 DIAGNOSIS — M79672 Pain in left foot: Secondary | ICD-10-CM | POA: Diagnosis not present

## 2022-11-10 DIAGNOSIS — M79671 Pain in right foot: Secondary | ICD-10-CM | POA: Diagnosis not present

## 2022-11-10 DIAGNOSIS — M79672 Pain in left foot: Secondary | ICD-10-CM | POA: Diagnosis not present

## 2022-11-15 DIAGNOSIS — L958 Other vasculitis limited to the skin: Principal | ICD-10-CM

## 2022-11-15 DIAGNOSIS — M79672 Pain in left foot: Secondary | ICD-10-CM | POA: Diagnosis not present

## 2022-11-15 DIAGNOSIS — K5904 Chronic idiopathic constipation: Secondary | ICD-10-CM | POA: Diagnosis not present

## 2022-11-15 DIAGNOSIS — M79671 Pain in right foot: Secondary | ICD-10-CM | POA: Diagnosis not present

## 2022-11-15 MED ORDER — HYDROXYCHLOROQUINE 200 MG TABLET
ORAL_TABLET | Freq: Two times a day (BID) | ORAL | 3 refills | 30.00000 days | Status: CP
Start: 2022-11-15 — End: ?

## 2022-11-15 MED ORDER — METHOTREXATE SODIUM 2.5 MG TABLET
ORAL_TABLET | ORAL | 0 refills | 14.00000 days | Status: CP
Start: 2022-11-15 — End: ?

## 2022-11-16 DIAGNOSIS — M67961 Unspecified disorder of synovium and tendon, right lower leg: Secondary | ICD-10-CM | POA: Diagnosis not present

## 2022-11-16 DIAGNOSIS — M67962 Unspecified disorder of synovium and tendon, left lower leg: Secondary | ICD-10-CM | POA: Diagnosis not present

## 2022-11-28 DIAGNOSIS — L501 Idiopathic urticaria: Secondary | ICD-10-CM | POA: Diagnosis not present

## 2022-11-29 ENCOUNTER — Encounter: Payer: Self-pay | Admitting: Family Medicine

## 2022-11-29 ENCOUNTER — Ambulatory Visit (INDEPENDENT_AMBULATORY_CARE_PROVIDER_SITE_OTHER): Payer: Medicare Other | Admitting: Family Medicine

## 2022-11-29 VITALS — BP 120/80 | HR 75 | Temp 96.9°F | Ht 65.0 in | Wt 181.8 lb

## 2022-11-29 DIAGNOSIS — I951 Orthostatic hypotension: Secondary | ICD-10-CM

## 2022-11-29 DIAGNOSIS — I471 Supraventricular tachycardia, unspecified: Secondary | ICD-10-CM

## 2022-11-29 DIAGNOSIS — E538 Deficiency of other specified B group vitamins: Secondary | ICD-10-CM

## 2022-11-29 DIAGNOSIS — M797 Fibromyalgia: Secondary | ICD-10-CM | POA: Diagnosis not present

## 2022-11-29 DIAGNOSIS — G9332 Myalgic encephalomyelitis/chronic fatigue syndrome: Secondary | ICD-10-CM

## 2022-11-29 DIAGNOSIS — E039 Hypothyroidism, unspecified: Secondary | ICD-10-CM | POA: Diagnosis not present

## 2022-11-29 MED ORDER — CYANOCOBALAMIN 1000 MCG/ML IJ SOLN: 1000.0000 ug | INTRAMUSCULAR | 3 refills | Status: AC

## 2022-11-29 NOTE — Progress Notes (Signed)
Provider:  Alain Honey, MD  Careteam: Patient Care Team: Wardell Honour, MD as PCP - General (Family Medicine) Jerline Pain, MD as PCP - Cardiology (Cardiology) Warren Danes, PA-C as Physician Assistant (Dermatology) Tiajuana Amass, MD as Referring Physician (Allergy and Immunology) Ronald Lobo, MD as Consulting Physician (Gastroenterology) Ralene Ok, MD as Consulting Physician (General Surgery) Melina Schools, MD as Consulting Physician (Orthopedic Surgery) Baxter Kail, MD (Internal Medicine) Regal, Tamala Fothergill, DPM as Consulting Physician (Podiatry) Jacelyn Pi, MD as Referring Physician (Endocrinology) Sanjuana Kava, MD (Inactive) as Referring Physician (Obstetrics and Gynecology) Fonnie Mu, OD (Optometry) Rana Snare, MD (Inactive) (Urology) Blima Dessert, MD as Referring Physician (Rheumatology)  PLACE OF SERVICE:  [place]  Advanced Directive information    Allergies  Allergen Reactions   Aspirin Anaphylaxis    In higher doses   Bee Venom Shortness Of Breath, Itching, Swelling and Hives   Peanut-Containing Drug Products Anaphylaxis and Other (See Comments)   Ambien [Zolpidem Tartrate] Other (See Comments)    Hallucinations   Tape Itching, Swelling and Rash   Ciprofloxacin Nausea And Vomiting   Latex Rash   Minocycline Rash   Shellfish Allergy Rash    Chief Complaint  Patient presents with   Medical Management of Chronic Issues    Patient presents today for a 3 month follow-up   Quality Metric Gaps    AWV, mammogram, colonoscopy, DEXA scan, zoster, pneumonia, COVID#7     HPI: Patient is a 67 y.o. female .  [name] is here today for follow-up depression fibromyalgia.  At one of her last visits an A1c was done because she had been on prednisone.  A1c was at the level of prediabetes and this was discussed with her.  Carb restriction and weight loss was recommended and she took this recommendation more seriously than most.  She  has lost some 30 pounds on a diet that allows her to eat most anything she wants but in smaller amounts.  She is very enthusiastic about this weight loss and overall feels better.  She continues on her antidepressants along with Abilify and she feels that has made a positive difference in her life. She has been seen here recently for upper respiratory infections including sinus.  She continues to have some drainage and cough.  Note that she has been taking antihistamine chronically, which might have the effect of making the drainage thicker.  I recommended continued use of Flonase and leaving off antihistamine. She also requests a refill on her B12.  She gives herself monthly injections of 1000 mcg  Review of Systems:  Review of Systems  Constitutional:  Positive for weight loss. Negative for malaise/fatigue.  HENT:  Positive for congestion.   Respiratory: Negative.    Cardiovascular: Negative.   Musculoskeletal:  Positive for joint pain.  Neurological: Negative.   All other systems reviewed and are negative.   Past Medical History:  Diagnosis Date   Asthma    Back pain    Chronic fatigue syndrome    Dr. Sabra Heck   Complication of anesthesia    Cough    /SOB, PFT's nl, improved with Bronchodilation 8/11   Dysrhythmia    Endometrial cancer ([place])    Esophageal spasm    GERD Dr. Sabra Heck, Dr. Wynetta Emery; egd 2006 nl; UGI 3/13 Small HH, moderate GERD, nl motility; seen at [facility] ([place]), egd? neg, sone improvement on sucralfate susp   Fibromyalgia    Foot pain    Dr. Rushie Nyhan  Hashimoto's thyroiditis    High triglycerides    Hypothyroidism    Dr. Lewis Shock 4/14   Leukocytoclastic vasculitis ([place])    Dr. Tonia Brooms   Low HDL (under 40)    Memory loss    Mitral valve prolapse    Paroxysmal atrial tachycardia    Dr. Marlou Porch   Patent foramen ovale    Pernicious anemia    Dr. Sabra Heck   PONV (postoperative nausea and vomiting)    along time ago had nausea ans vomiting after surgery-25 years  ago   Reflux    ? delayed gastric emptying--GES nl 01/2012 (6% at 2hrs)   RLS (restless legs syndrome)    low ferritin Dr. Sabra Heck   Rosacea    Sleep apnea    Uterine carcinoma [facility])    Dr. Polly Cobia   Past Surgical History:  Procedure Laterality Date   ABDOMINAL EXPOSURE N/A 12/07/2021   Procedure: ABDOMINAL EXPOSURE;  Surgeon: Marty Heck, MD;  Location: [place];  Service: Vascular;  Laterality: N/A;   ABDOMINAL HYSTERECTOMY     bladder tack     x 2   BUNIONECTOMY     CHOLECYSTECTOMY  2009   COLONOSCOPY     scr, 12/2008 ([facility]) nl   COLOSTOMY Left 06/05/2018   Procedure: COLOSTOMY;  Surgeon: Ralene Ok, MD;  Location: [place];  Service: General;  Laterality: Left;   COLOSTOMY REVERSAL N/A 10/16/2018   Procedure: HARTMANN'S COLOSTOMY REVERSAL, RIGID PROCTOSCOPY;  Surgeon: Ralene Ok, MD;  Location: [facility];  Service: General;  Laterality: N/A;   HAMMER TOE SURGERY     HYSTERECTOMY ABDOMINAL WITH SALPINGO-OOPHORECTOMY  2008   [place]  04/09/2019   x2   [place] N/A 04/09/2019   Procedure: INCISIONAL HERNIA REPAIR WITH MESH;  Surgeon: Ralene Ok, MD;  Location: [place];  Service: General;  Laterality: N/A;   LAPAROSCOPY N/A 10/16/2018   Procedure: LAPAROSCOPY DIAGNOSTIC WITH LYSIS OF ADHESIONS;  Surgeon: Ralene Ok, MD;  Location: [facility];  Service: General;  Laterality: N/A;   LUNG BIOPSY     L Lower lung pulm nodules, largest 4.45m, low risk, repeat CT in 1 yr now [facility] Stable tiny lung nodules, no f/u needed   LYMPH NODE DISSECTION  2008   LYSIS OF ADHESION N/A 04/09/2019   Procedure: OPEN LYSIS OF ADHESION;  Surgeon: RRalene Ok MD;  [place]  Service: General;  Laterality: N/A;   METATARSAL OSTEOTOMY     NECK SURGERY     x 2   OBLIQUE LUMBAR INTERBODY FUSION 1 LEVEL WITH PERCUTANEOUS SCREWS N/A 12/07/2021   Procedure: OBLIQUE LUMBAR INTERBODY FUSION 1 LEVEL WITH PERCUTANEOUS SCREWS  (OLIF L4-5 WITH POSTERIOR SPINAL FUSION AND PEDICLE SCREWS);  Surgeon: BMelina Schools MD;  [place]  Service: Orthopedics;  Laterality: N/A;  4 HRS DR. CLARK TO DO APPROACH LEFT TAP BLOCK WITH EXPAREL 3 C-BED   PARTIAL COLECTOMY N/A 06/05/2018   Procedure: EXPLORATORY LAPAROTOMY AND PARTIAL COLECTOMY;  Surgeon: RRalene Ok MD;  [place]  Service: General;  Laterality: N/A;   TONSILLECTOMY     [place]     with Nuclear test, Dr. SMarlou Porch Low risk   VESICOVAGINAL FISTULA CLOSURE W/ TAH     Social History:   reports that she has never smoked. She has never used smokeless tobacco. She reports current alcohol use. She reports that she does not use drugs.  Family History  Problem Relation Age of Onset  Hashimoto's thyroiditis Mother    High blood pressure Mother    Stroke Mother    Other Mother        Borderline DM   Alzheimer's disease Mother    Ulcerative colitis Father    Meniere's disease Father    Hearing loss Father    Stroke Father    Stroke Sister     Medications: Patient's Medications  New Prescriptions   No medications on file  Previous Medications   ACETAMINOPHEN (TYLENOL) 500 MG TABLET    Take 2 tablets (1,000 mg total) by mouth every 6 (six) hours as needed.   ALBUTEROL (PROVENTIL HFA;VENTOLIN HFA) 108 (90 BASE) MCG/ACT INHALER    Inhale 2 puffs into the lungs every 4 (four) hours as needed for wheezing or shortness of breath.   ARIPIPRAZOLE (ABILIFY) 2 MG TABLET    TAKE 1 TABLET BY MOUTH DAILY   ASCORBIC ACID (VITAMIN C) 1000 MG TABLET    Take 1,000 mg by mouth in the morning.   ASPIRIN EC 81 MG TABLET    Take 1 tablet (81 mg total) by mouth daily. Swallow whole.   ATORVASTATIN (LIPITOR) 10 MG TABLET    TAKE 1 TABLET BY MOUTH DAILY   AZELAIC ACID (FINACEA EX)    Apply 1 application  topically at bedtime.   BENZONATATE (TESSALON) 100 MG CAPSULE    Take 1 capsule (100 mg total) by mouth 2 (two) times daily as needed for cough.    BUPROPION (WELLBUTRIN XL) 150 MG 24 HR TABLET    Take 450 mg by mouth daily.   CETIRIZINE (ZYRTEC) 10 MG TABLET    Take 10 mg by mouth at bedtime.   CYANOCOBALAMIN (,VITAMIN B-12,) 1000 MCG/ML INJECTION    Inject 1 mL (1,000 mcg total) into the muscle every 30 (thirty) days.   DOCUSATE SODIUM (COLACE) 100 MG CAPSULE    Take 100 mg by mouth 2 (two) times daily as needed for mild constipation.   DOXYCYCLINE (PERIOSTAT) 20 MG TABLET    Take 20 mg by mouth 2 (two) times daily as needed.   DULOXETINE (CYMBALTA) 30 MG CAPSULE    Take 90 mg by mouth at bedtime.   EPINEPHRINE 0.3 MG/0.3 ML IJ SOAJ INJECTION    Inject 0.3 mg into the muscle as needed for anaphylaxis.   FEXOFENADINE (ALLEGRA) 180 MG TABLET    Take 180 mg by mouth daily.   FLUTICASONE-SALMETEROL (ADVAIR HFA) 115-21 MCG/ACT INHALER    Inhale 2 puffs into the lungs 2 (two) times daily.   HYDROXYCHLOROQUINE (PLAQUENIL) 200 MG TABLET    Take 200 mg by mouth 2 (two) times daily with a meal.   IPRATROPIUM (ATROVENT) 0.06 % NASAL SPRAY    Place 2 sprays into both nostrils in the morning and at bedtime.   KETOTIFEN (ZADITOR) 0.025 % OPHTHALMIC SOLUTION    Place 1 drop into both eyes in the morning.   LEVOTHYROXINE SODIUM 175 MCG CAPS    Take 175 mcg by mouth daily before breakfast.   METOPROLOL SUCCINATE (TOPROL-XL) 25 MG 24 HR TABLET    TAKE 1 TABLET BY MOUTH ONCE  DAILY   NITROGLYCERIN (NITROSTAT) 0.4 MG SL TABLET    Place 1 tablet (0.4 mg total) under the tongue every 5 (five) minutes as needed for chest pain.   OMALIZUMAB (XOLAIR) 150 MG INJECTION    300 mg every 30 (thirty) days. On the 13th of the month   PANTOPRAZOLE (PROTONIX) 40 MG TABLET  Take 80 mg by mouth 2 (two) times daily.   POLYETHYLENE GLYCOL (MIRALAX / GLYCOLAX) 17 G PACKET    Take 17 g by mouth daily as needed (constipation.).   PRAMIPEXOLE (MIRAPEX) 0.125 MG TABLET    Take 3 tablets (0.375 mg total) by mouth at bedtime as needed (restless legs syndrome).   PREGABALIN (LYRICA)  75 MG CAPSULE    Take 1 capsule (75 mg total) by mouth 2 (two) times daily.   PROBIOTIC PRODUCT (PROBIOTIC PO)    Take 1 capsule by mouth in the morning.   SYRINGE, DISPOSABLE, (B-D SYRINGE LUER-LOK 3CC) 3 ML MISC      Modified Medications   No medications on file  Discontinued Medications   No medications on file    Physical Exam:  Vitals:   11/29/22 1037  Weight: 181 lb 12.8 oz (82.5 kg)  Height: '5\' 5"'$  (1.651 m)   Body mass index is 30.25 kg/m. Wt Readings from Last 3 Encounters:  11/29/22 181 lb 12.8 oz (82.5 kg)  10/27/22 185 lb (83.9 kg)  08/28/22 194 lb 9.6 oz (88.3 kg)    Physical Exam Vitals reviewed.  Constitutional:      Appearance: Normal appearance.  HENT:     Mouth/Throat:     Mouth: Mucous membranes are moist.     Pharynx: Oropharynx is clear.  Cardiovascular:     Rate and Rhythm: Normal rate and regular rhythm.  Pulmonary:     Effort: Pulmonary effort is normal.     Breath sounds: Normal breath sounds.  Abdominal:     Palpations: Abdomen is soft.  Neurological:     General: No focal deficit present.     Mental Status: She is alert and oriented to person, place, and time.  Psychiatric:        Mood and Affect: Mood normal.     Labs reviewed: Basic Metabolic Panel: Recent Labs    12/01/21 1116  NA 141  K 4.2  CL 104  CO2 30  GLUCOSE 105*  BUN 13  CREATININE 1.17*  CALCIUM 9.4   Liver Function Tests: No results for input(s): "AST", "ALT", "ALKPHOS", "BILITOT", "PROT", "ALBUMIN" in the last 8760 hours. No results for input(s): "LIPASE", "AMYLASE" in the last 8760 hours. No results for input(s): "AMMONIA" in the last 8760 hours. CBC: Recent Labs    12/01/21 1116  WBC 9.4  HGB 13.9  HCT 42.9  MCV 94.9  PLT 434*   Lipid Panel: No results for input(s): "CHOL", "HDL", "LDLCALC", "TRIG", "CHOLHDL", "LDLDIRECT" in the last 8760 hours. TSH: No results for input(s): "TSH" in the last 8760 hours. A1C: Lab Results  Component Value Date    HGBA1C 6.3 (H) 08/16/2022     Assessment/Plan  1. B12 deficiency Refilled medicine as described above  2. Chronic fatigue syndrome Over is making a difference all she feels better I think combination of meds she is on and the fact that she has had some success with weight loss and that has impacted her health in a positive way  3. Fibromyalgia No complaints today.  Again combination of antidepressants with Abilify appears to be a good combination for her, for now  4. Hypothyroidism, unspecified type Thyroid is followed by endocrinology and different medical group  5. Orthostatic hypotension She reports some feeling of faintness when she first stands up.  She does take metoprolol, not for blood pressure but for a history of supraventricular tachycardia.  Suggested she try to increase her fluid intake and  wait a few seconds before she starts walking upon standing  6. Supraventricular tachycardia No recent episodes.  She does try to avoid caffeine and decongestants   Alain Honey, MD [place] [phone]

## 2022-12-01 DIAGNOSIS — M25572 Pain in left ankle and joints of left foot: Secondary | ICD-10-CM | POA: Diagnosis not present

## 2022-12-01 DIAGNOSIS — M25571 Pain in right ankle and joints of right foot: Secondary | ICD-10-CM | POA: Diagnosis not present

## 2022-12-08 DIAGNOSIS — M67962 Unspecified disorder of synovium and tendon, left lower leg: Secondary | ICD-10-CM | POA: Diagnosis not present

## 2022-12-08 DIAGNOSIS — M6701 Short Achilles tendon (acquired), right ankle: Secondary | ICD-10-CM | POA: Diagnosis not present

## 2022-12-08 DIAGNOSIS — M67961 Unspecified disorder of synovium and tendon, right lower leg: Secondary | ICD-10-CM | POA: Diagnosis not present

## 2022-12-12 ENCOUNTER — Ambulatory Visit (INDEPENDENT_AMBULATORY_CARE_PROVIDER_SITE_OTHER): Payer: Medicare Other | Admitting: Family

## 2022-12-12 ENCOUNTER — Telehealth: Payer: Medicare Other

## 2022-12-12 ENCOUNTER — Encounter: Payer: Self-pay | Admitting: Family

## 2022-12-12 VITALS — Ht 65.0 in | Wt 180.0 lb

## 2022-12-12 DIAGNOSIS — Z0000 Encounter for general adult medical examination without abnormal findings: Secondary | ICD-10-CM | POA: Diagnosis not present

## 2022-12-12 NOTE — Patient Instructions (Addendum)
Ms. [name] , Thank you for taking time to come for your Medicare Wellness Visit. I appreciate your ongoing commitment to your health goals. Please review the following plan we discussed and let me know if I can assist you in the future.   Screening recommendations/referrals: Colonoscopy : Up to date  Mammogram : Has appt in April,2024  Bone Density : Please obtain records from previous provider  Recommended yearly ophthalmology/optometry visit for glaucoma screening and checkup Recommended yearly dental visit for hygiene and checkup  Vaccinations: Influenza vaccine- due annually in September/October Pneumococcal vaccine : due for Prevnar ,20  Tdap vaccine : Up to date  Shingles vaccine Due please get vaccine at the pharmacy    Advanced directives: yes   Conditions/risks identified: advanced age (>83mn, >>42women);family history of premature cardiovascular disease;obesity (BMI >30kg/m2);sedentary lifestyle;dyslipidemia  Next appointment: 1 year    Preventive Care 626Years and Older, Female Preventive care refers to lifestyle choices and visits with your health care provider that can promote health and wellness. What does preventive care include? A yearly physical exam. This is also called an annual well check. Dental exams once or twice a year. Routine eye exams. Ask your health care provider how often you should have your eyes checked. Personal lifestyle choices, including: Daily care of your teeth and gums. Regular physical activity. Eating a healthy diet. Avoiding tobacco and drug use. Limiting alcohol use. Practicing safe sex. Taking low-dose aspirin every day. Taking vitamin and mineral supplements as recommended by your health care provider. What happens during an annual well check? The services and screenings done by your health care provider during your annual well check will depend on your age, overall health, lifestyle risk factors, and family history of  disease. Counseling  Your health care provider may ask you questions about your: Alcohol use. Tobacco use. Drug use. Emotional well-being. Home and relationship well-being. Sexual activity. Eating habits. History of falls. Memory and ability to understand (cognition). Work and work eStatistician Reproductive health. Screening  You may have the following tests or measurements: Height, weight, and BMI. Blood pressure. Lipid and cholesterol levels. These may be checked every 5 years, or more frequently if you are over 572years old. Skin check. Lung cancer screening. You may have this screening every year starting at age 72726if you have a 30-pack-year history of smoking and currently smoke or have quit within the past 15 years. Fecal occult blood test (FOBT) of the stool. You may have this test every year starting at age 72723 Flexible sigmoidoscopy or colonoscopy. You may have a sigmoidoscopy every 5 years or a colonoscopy every 10 years starting at age 72725 Hepatitis C blood test. Hepatitis B blood test. Sexually transmitted disease (STD) testing. Diabetes screening. This is done by checking your blood sugar (glucose) after you have not eaten for a while (fasting). You may have this done every 1-3 years. Bone density scan. This is done to screen for osteoporosis. You may have this done starting at age 69 Mammogram. This may be done every 1-2 years. Talk to your health care provider about how often you should have regular mammograms. Talk with your health care provider about your test results, treatment options, and if necessary, the need for more tests. Vaccines  Your health care provider may recommend certain vaccines, such as: Influenza vaccine. This is recommended every year. Tetanus, diphtheria, and acellular pertussis (Tdap, Td) vaccine. You may need a Td booster every 10 years. Zoster vaccine. You may need this  after age 69. Pneumococcal 13-valent conjugate (PCV13) vaccine. One  dose is recommended after age 55. Pneumococcal polysaccharide (PPSV23) vaccine. One dose is recommended after age 54. Talk to your health care provider about which screenings and vaccines you need and how often you need them. This information is not intended to replace advice given to you by your health care provider. Make sure you discuss any questions you have with your health care provider. Document Released: 11/12/2015 Document Revised: 07/05/2016 Document Reviewed: 08/17/2015 Elsevier Interactive Patient Education  2017 [place] Prevention in the Home Falls can cause injuries. They can happen to people of all ages. There are many things you can do to make your home safe and to help prevent falls. What can I do on the outside of my home? Regularly fix the edges of walkways and driveways and fix any cracks. Remove anything that might make you trip as you walk through a door, such as a raised step or threshold. Trim any bushes or trees on the path to your home. Use bright outdoor lighting. Clear any walking paths of anything that might make someone trip, such as rocks or tools. Regularly check to see if handrails are loose or broken. Make sure that both sides of any steps have handrails. Any raised decks and porches should have guardrails on the edges. Have any leaves, snow, or ice cleared regularly. Use sand or salt on walking paths during winter. Clean up any spills in your garage right away. This includes oil or grease spills. What can I do in the bathroom? Use night lights. Install grab bars by the toilet and in the tub and shower. Do not use towel bars as grab bars. Use non-skid mats or decals in the tub or shower. If you need to sit down in the shower, use a plastic, non-slip stool. Keep the floor dry. Clean up any water that spills on the floor as soon as it happens. Remove soap buildup in the tub or shower regularly. Attach bath mats securely with double-sided  non-slip rug tape. Do not have throw rugs and other things on the floor that can make you trip. What can I do in the bedroom? Use night lights. Make sure that you have a light by your bed that is easy to reach. Do not use any sheets or blankets that are too big for your bed. They should not hang down onto the floor. Have a firm chair that has side arms. You can use this for support while you get dressed. Do not have throw rugs and other things on the floor that can make you trip. What can I do in the kitchen? Clean up any spills right away. Avoid walking on wet floors. Keep items that you use a lot in easy-to-reach places. If you need to reach something above you, use a strong step stool that has a grab bar. Keep electrical cords out of the way. Do not use floor polish or wax that makes floors slippery. If you must use wax, use non-skid floor wax. Do not have throw rugs and other things on the floor that can make you trip. What can I do with my stairs? Do not leave any items on the stairs. Make sure that there are handrails on both sides of the stairs and use them. Fix handrails that are broken or loose. Make sure that handrails are as long as the stairways. Check any carpeting to make sure that it is firmly attached to the  stairs. Fix any carpet that is loose or worn. Avoid having throw rugs at the top or bottom of the stairs. If you do have throw rugs, attach them to the floor with carpet tape. Make sure that you have a light switch at the top of the stairs and the bottom of the stairs. If you do not have them, ask someone to add them for you. What else can I do to help prevent falls? Wear shoes that: Do not have high heels. Have rubber bottoms. Are comfortable and fit you well. Are closed at the toe. Do not wear sandals. If you use a stepladder: Make sure that it is fully opened. Do not climb a closed stepladder. Make sure that both sides of the stepladder are locked into place. Ask  someone to hold it for you, if possible. Clearly mark and make sure that you can see: Any grab bars or handrails. First and last steps. Where the edge of each step is. Use tools that help you move around (mobility aids) if they are needed. These include: Canes. Walkers. Scooters. Crutches. Turn on the lights when you go into a dark area. Replace any light bulbs as soon as they burn out. Set up your furniture so you have a clear path. Avoid moving your furniture around. If any of your floors are uneven, fix them. If there are any pets around you, be aware of where they are. Review your medicines with your doctor. Some medicines can make you feel dizzy. This can increase your chance of falling. Ask your doctor what other things that you can do to help prevent falls. This information is not intended to replace advice given to you by your health care provider. Make sure you discuss any questions you have with your health care provider. Document Released: 08/12/2009 Document Revised: 03/23/2016 Document Reviewed: 11/20/2014 Elsevier Interactive Patient Education  2017 [employer].

## 2022-12-12 NOTE — Progress Notes (Signed)
This service is provided via telemedicine  No vital signs collected/recorded due to the encounter was a telemedicine visit.   Location of patient (ex: home, work):  home  Patient consents to a telephone visit:  yes  Location of the provider (ex: office, home):  [facility]  Names of all persons participating in the telemedicine service and their role in the encounter:  patient, [name] CCMA, Dr. Marlowe Sax, NP  Time spent on call:  12 minutes     Subjective:   [name] is a 67 y.o. female who presents for Medicare Annual (Subsequent) preventive examination.  Review of Systems    Cardiac Risk Factors include: advanced age (>96mn, >>21women);family history of premature cardiovascular disease;obesity (BMI >30kg/m2);sedentary lifestyle;dyslipidemia     Objective:    Today's Vitals   12/12/22 1112 12/12/22 1151  Weight: 180 lb (81.6 kg)   Height: 5' 5"$  (1.651 m)   PainSc:  8    Body mass index is 29.95 kg/m.     12/12/2022    9:39 AM 10/27/2022   11:18 AM 12/07/2021    7:09 AM 12/01/2021   11:09 AM 08/17/2021    1:18 PM 04/09/2019   12:57 PM 04/08/2019   11:40 AM  Advanced Directives  Does Patient Have a Medical Advance Directive? Yes Yes Yes Yes Yes Yes Yes  Type of AParamedicof [place]Living will;Out of facility DNR (pink MOST or yellow form) HR[place]Living will;Out of facility DNR (pink MOST or yellow form)  HClayLiving will HHeilLiving will HRivertonLiving will HZaleskiLiving will  Does patient want to make changes to medical advance directive? No - Patient declined No - Patient declined    No - Patient declined No - Patient declined  Copy of [place]ona Valleyin Chart? Yes - validated most recent copy scanned in chart (See row information) Yes - validated most recent copy scanned in  chart (See row information)  Yes - validated most recent copy scanned in chart (See row information)  No - copy requested No - copy requested    Current Medications (verified) Outpatient Encounter Medications as of 12/12/2022  Medication Sig   acetaminophen (TYLENOL) 500 MG tablet Take 2 tablets (1,000 mg total) by mouth every 6 (six) hours as needed.   albuterol (PROVENTIL HFA;VENTOLIN HFA) 108 (90 Base) MCG/ACT inhaler Inhale 2 puffs into the lungs every 4 (four) hours as needed for wheezing or shortness of breath.   ARIPiprazole (ABILIFY) 2 MG tablet TAKE 1 TABLET BY MOUTH DAILY   Ascorbic Acid (VITAMIN C) 1000 MG tablet Take 1,000 mg by mouth in the morning.   aspirin EC 81 MG tablet Take 1 tablet (81 mg total) by mouth daily. Swallow whole.   atorvastatin (LIPITOR) 10 MG tablet TAKE 1 TABLET BY MOUTH DAILY   buPROPion (WELLBUTRIN XL) 150 MG 24 hr tablet Take 450 mg by mouth daily.   cyanocobalamin (VITAMIN B12) 1000 MCG/ML injection Inject 1 mL (1,000 mcg total) into the muscle every 30 (thirty) days.   docusate sodium (COLACE) 100 MG capsule Take 100 mg by mouth 2 (two) times daily as needed for mild constipation.   doxycycline (PERIOSTAT) 20 MG tablet Take 20 mg by mouth 2 (two) times daily as needed.   DULoxetine (CYMBALTA) 30 MG capsule Take 90 mg by mouth at bedtime.   EPINEPHrine 0.3 mg/0.3 mL IJ SOAJ injection  Inject 0.3 mg into the muscle as needed for anaphylaxis.   fexofenadine (ALLEGRA) 180 MG tablet Take 180 mg by mouth daily.   fluticasone-salmeterol (ADVAIR HFA) 115-21 MCG/ACT inhaler Inhale 2 puffs into the lungs 2 (two) times daily.   hydroxychloroquine (PLAQUENIL) 200 MG tablet Take 200 mg by mouth 2 (two) times daily with a meal.   ipratropium (ATROVENT) 0.06 % nasal spray Place 2 sprays into both nostrils in the morning and at bedtime.   ketotifen (ZADITOR) 0.025 % ophthalmic solution Place 1 drop into both eyes in the morning.   Levothyroxine Sodium 175 MCG CAPS Take  175 mcg by mouth daily before breakfast.   metoprolol succinate (TOPROL-XL) 25 MG 24 hr tablet TAKE 1 TABLET BY MOUTH ONCE  DAILY   nitroGLYCERIN (NITROSTAT) 0.4 MG SL tablet Place 1 tablet (0.4 mg total) under the tongue every 5 (five) minutes as needed for chest pain.   omalizumab (XOLAIR) 150 MG injection 300 mg every 30 (thirty) days. On the 13th of the month   pantoprazole (PROTONIX) 40 MG tablet Take 80 mg by mouth 2 (two) times daily.   polyethylene glycol (MIRALAX / GLYCOLAX) 17 g packet Take 17 g by mouth daily as needed (constipation.).   pramipexole (MIRAPEX) 0.125 MG tablet Take 3 tablets (0.375 mg total) by mouth at bedtime as needed (restless legs syndrome).   pregabalin (LYRICA) 75 MG capsule Take 1 capsule (75 mg total) by mouth 2 (two) times daily.   Probiotic Product (PROBIOTIC PO) Take 1 capsule by mouth in the morning.   Syringe, Disposable, (B-D SYRINGE LUER-LOK 3CC) 3 ML MISC    No facility-administered encounter medications on file as of 12/12/2022.    Allergies (verified) Aspirin, Bee venom, Peanut-containing drug products, Ambien [zolpidem tartrate], Tape, Ciprofloxacin, Latex, Minocycline, and Shellfish allergy   History: Past Medical History:  Diagnosis Date   Asthma    Back pain    Chronic fatigue syndrome    Dr. Sabra Heck   Complication of anesthesia    Cough    /SOB, PFT's nl, improved with Bronchodilation 8/11   Dysrhythmia    Endometrial cancer ([place])    Esophageal spasm    GERD Dr. Sabra Heck, Dr. Wynetta Emery; egd 2006 nl; UGI 3/13 [facility], moderate GERD, nl motility; seen at [facility] ([place]), egd? neg, sone improvement on sucralfate susp   Fibromyalgia    Foot pain    Dr. Rushie Nyhan   Hashimoto's thyroiditis    High triglycerides    Hypothyroidism    Dr. Lewis Shock 4/14   Leukocytoclastic vasculitis ([place])    Dr. Tonia Brooms   Low HDL (under 17)    Memory loss    Mitral valve prolapse    Paroxysmal atrial tachycardia    Dr. Marlou Porch   Patent foramen ovale     Pernicious anemia    Dr. Sabra Heck   PONV (postoperative nausea and vomiting)    along time ago had nausea ans vomiting after surgery-25 years ago   Reflux    ? delayed gastric emptying--GES nl 01/2012 (6% at 2hrs)   RLS (restless legs syndrome)    low ferritin Dr. Sabra Heck   Rosacea    Sleep apnea    Uterine carcinoma ([place])    Dr. Polly Cobia   Past Surgical History:  Procedure Laterality Date   ABDOMINAL EXPOSURE N/A 12/07/2021   Procedure: ABDOMINAL EXPOSURE;  Surgeon: Marty Heck, MD;  Location: [place];  Service: Vascular;  Laterality: N/A;   ABDOMINAL HYSTERECTOMY     bladder  tack     x 2   BUNIONECTOMY     CHOLECYSTECTOMY  2009   COLONOSCOPY     scr, 12/2008 ([facility]) nl   COLOSTOMY Left 06/05/2018   Procedure: COLOSTOMY;  Surgeon: Ralene Ok, MD;  Location: [place];  Service: General;  Laterality: Left;   COLOSTOMY REVERSAL N/A 10/16/2018   Procedure: HARTMANN'S COLOSTOMY REVERSAL, RIGID PROCTOSCOPY;  Surgeon: Ralene Ok, MD;  Location: [facility];  Service: General;  Laterality: N/A;   HAMMER TOE SURGERY     HYSTERECTOMY ABDOMINAL WITH SALPINGO-OOPHORECTOMY  2008   [place]  04/09/2019   x2   Cool N/A 04/09/2019   Procedure: INCISIONAL HERNIA REPAIR WITH MESH;  Surgeon: Ralene Ok, MD;  Location: [place];  Service: General;  Laterality: N/A;   LAPAROSCOPY N/A 10/16/2018   Procedure: LAPAROSCOPY DIAGNOSTIC WITH LYSIS OF ADHESIONS;  Surgeon: Ralene Ok, MD;  Location[facility];  Service: General;  Laterality: N/A;   LUNG BIOPSY     L Lower lung pulm nodules, largest 4.68m, low risk, repeat CT in 1 yr [facility] Stable tiny lung nodules, no f/u needed   LYMPH NODE DISSECTION  2008   LYSIS OF ADHESION N/A 04/09/2019   Procedure: OPEN LYSIS OF ADHESION;  Surgeon: RRalene Ok MD;  [place]  Service: General;  Laterality: N/A;   METATARSAL OSTEOTOMY     NECK SURGERY     x 2   OBLIQUE LUMBAR  INTERBODY FUSION 1 LEVEL WITH PERCUTANEOUS SCREWS N/A 12/07/2021   Procedure: OBLIQUE LUMBAR INTERBODY FUSION 1 LEVEL WITH PERCUTANEOUS SCREWS (OLIF L4-5 WITH POSTERIOR SPINAL FUSION AND PEDICLE SCREWS);  Surgeon: BMelina Schools MD;  [place]  Service: Orthopedics;  Laterality: N/A;  4 HRS DR. CLARK TO DO APPROACH LEFT TAP BLOCK WITH EXPAREL 3 C-BED   PARTIAL COLECTOMY N/A 06/05/2018   Procedure: EXPLORATORY LAPAROTOMY AND PARTIAL COLECTOMY;  Surgeon: RRalene Ok MD;  [place]  Service: General;  Laterality: N/A;   TONSILLECTOMY     UKoreaECHOCARDIOGRAPHY     with Nuclear test, Dr. SMarlou Porch Low risk   VESICOVAGINAL FISTULA CLOSURE W/ TAH     Family History  Problem Relation Age of Onset   Hashimoto's thyroiditis Mother    High blood pressure Mother    Stroke Mother    Other Mother        Borderline DM   Alzheimer's disease Mother    Ulcerative colitis Father    Meniere's disease Father    Hearing loss Father    Stroke Father    Stroke Sister    Social History   Socioeconomic History   Marital status: Married    Spouse name: Not on file   Number of children: Not on file   Years of education: Not on file   Highest education level: Not on file  Occupational History   Not on file  Tobacco Use   Smoking status: Never   Smokeless tobacco: Never  Vaping Use   Vaping Use: Never used  Substance and Sexual Activity   Alcohol use: Yes    Comment: maybe a glass of wine/month   Drug use: Never   Sexual activity: Not on file  Other Topics Concern   Not on file  Social History Narrative   Diet: High calorie/ high fat      Caffeine: Yes      Married, if yes what year: Married, 1986  Do you live in a house, apartment, assisted living, condo, trailer, ect: House      Is it one or more stories: One story      How many persons live in your home? 2      Pets: 2      Highest level or education completed: completed college, some graduate school       Current/Past profession: Retail-Division management      Exercise: Rarely                 Type and how often: Not often- experience PEM         Living Will:    DNR:   POA/HPOA:      Functional Status:   Do you have difficulty bathing or dressing yourself? No   Do you have difficulty preparing food or eating?No   Do you have difficulty managing your medications?No   Do you have difficulty managing your finances?No   Do you have difficulty affording your medications?No   Social Determinants of Radio broadcast assistant Strain: Not on file  Food Insecurity: Not on file  Transportation Needs: Not on file  Physical Activity: Not on file  Stress: Not on file  Social Connections: Not on file    Tobacco Counseling Counseling given: Not Answered   Clinical Intake:  Pre-visit preparation completed: No  Pain : 0-10 Pain Score: 8  Pain Type: Other (Comment) Pain Location: Foot Pain Orientation: Right Pain Radiating Towards: lower leg Pain Descriptors / Indicators: Aching, Dull, Sharp Pain Onset: Other (comment) (2 months) Pain Frequency: Constant Pain Relieving Factors: rest and meloxicam Effect of Pain on Daily Activities: walking  Pain Relieving Factors: rest and meloxicam  BMI - recorded: 30.25 Nutritional Status: BMI > 30  Obese Nutritional Risks: None Diabetes: No  How often do you need to have someone help you when you read instructions, pamphlets, or other written materials from your doctor or pharmacy?: 1 - Never What is the last grade level you completed in school?: Bachelor of arts Degree 16 yrs  Diabetic?No   Interpreter Needed?: No      Activities of Daily Living    12/12/2022   11:57 AM  In your present state of health, do you have any difficulty performing the following activities:  Hearing? 1  Vision? 0  Difficulty concentrating or making decisions? 0  Walking or climbing stairs? 0  Dressing or bathing? 0  Doing errands, shopping? 0   Preparing Food and eating ? N  Using the Toilet? N  In the past six months, have you accidently leaked urine? N  Do you have problems with loss of bowel control? N  Managing your Medications? N  Managing your Finances? N  Housekeeping or managing your Housekeeping? N    Patient Care Team: Wardell Honour, MD as PCP - General (Family Medicine) Jerline Pain, MD as PCP - Cardiology (Cardiology) Warren Danes, PA-C as Physician Assistant (Dermatology) Tiajuana Amass, MD as Referring Physician (Allergy and Immunology) Ronald Lobo, MD as Consulting Physician (Gastroenterology) Ralene Ok, MD as Consulting Physician (General Surgery) Melina Schools, MD as Consulting Physician (Orthopedic Surgery) Baxter Kail, MD (Internal Medicine) Paulla Dolly Tamala Fothergill, DPM as Consulting Physician (Podiatry) Jacelyn Pi, MD as Referring Physician (Endocrinology) Sanjuana Kava, MD (Inactive) as Referring Physician (Obstetrics and Gynecology) Fonnie Mu, OD (Optometry) Rana Snare, MD (Inactive) (Urology) Blima Dessert, MD as Referring Physician (Rheumatology)  Indicate any recent Medical Services you may have received from other  than [facility] providers in the past year (date may be approximate).     Assessment:   This is a routine wellness examination for Galadriel.  Hearing/Vision screen Vision Screening   Right eye Left eye Both eyes  Without correction     With correction 20/20 20/20     Dietary issues and exercise activities discussed: Current Exercise Habits: The patient does not participate in regular exercise at present, Exercise limited by: None identified   Goals Addressed             This Visit's Progress    Weight (lb) < 200 lb (90.7 kg)   180 lb (81.6 kg)    Loss weight down 105 lbs walking daily        Depression Screen    12/12/2022   11:18 AM 11/29/2022   10:45 AM 07/05/2021   10:44 AM  PHQ 2/9 Scores  PHQ - 2 Score 0 0 0  PHQ- 9 Score 0 0    Exception Documentation Other- indicate reason in comment box    Not completed annual wellness visit      Fall Risk    12/12/2022    9:39 AM 10/27/2022   11:18 AM 10/04/2021   10:31 AM 07/05/2021   10:43 AM  Fall Risk   Falls in the past year? 0 0 0 1  Number falls in past yr: 0 0 0 0  Injury with Fall? 0 0 0 0  Risk for fall due to : No Fall Risks No Fall Risks History of fall(s) No Fall Risks  Follow up Falls evaluation completed Falls evaluation completed Falls evaluation completed;Education provided;Falls prevention discussed Falls evaluation completed;Education provided;Falls prevention discussed    FALL RISK PREVENTION PERTAINING TO THE HOME:  Any stairs in or around the home? Yes  If so, are there any without handrails? No  Home free of loose throw rugs in walkways, pet beds, electrical cords, etc? No  Adequate lighting in your home to reduce risk of falls? Yes   ASSISTIVE DEVICES UTILIZED TO PREVENT FALLS:  Life alert? No  Use of a cane, walker or w/c? Yes  Grab bars in the bathroom? No  Shower chair or bench in shower? Yes  Elevated toilet seat or a handicapped toilet? No   TIMED UP AND GO:  Was the test performed? No .  Length of time to ambulate 10 feet: N/A  sec.   Gait slow and steady with assistive device  Cognitive Function:    07/22/2019    3:50 PM  MMSE - Mini Mental State Exam  Orientation to time 5  Orientation to Place 5  Registration 3  Attention/ Calculation 5  Recall 2  Language- name 2 objects 2  Language- repeat 1  Language- follow 3 step command 3  Language- read & follow direction 1  Write a sentence 1  Copy design 1  Total score 29        12/12/2022   11:20 AM  6CIT Screen  What Year? 0 points  What month? 0 points  What time? 0 points  Count back from 20 0 points  Months in reverse 0 points  Repeat phrase 0 points  Total Score 0 points    Immunizations Immunization History  Administered Date(s) Administered   Fluad  Quad(high Dose 65+) 08/02/2021, 07/28/2022   Influenza, High Dose Seasonal PF 09/24/2017, 11/24/2019, 08/16/2020, 08/22/2021   Influenza-Unspecified 04/29/2013, 08/01/2016, 07/29/2017   PFIZER Comirnaty(Gray Top)Covid-19 Tri-Sucrose Vaccine 02/25/2021   PFIZER(Purple Top)SARS-COV-2  Vaccination 12/05/2019, 12/30/2019, 08/16/2020, 08/22/2021   Pfizer Covid-19 Vaccine Bivalent Booster 37yr & up 07/28/2022   Pneumococcal Polysaccharide-23 09/24/2017, 04/11/2019, 11/24/2019, 08/16/2020, 08/22/2021   Pneumococcal-Unspecified 04/30/2011   Td 02/04/2018   Td (Adult), 2 Lf Tetanus Toxid, Preservative Free 02/04/2018   Zoster Recombinat (Shingrix) 03/02/2020   Zoster, Live 03/01/2020, 06/01/2020    TDAP status: Up to date  Flu Vaccine status: Up to date  Pneumococcal vaccine status: Due, Education has been provided regarding the importance of this vaccine. Advised may receive this vaccine at local pharmacy or Health Dept. Aware to provide a copy of the vaccination record if obtained from local pharmacy or Health Dept. Verbalized acceptance and understanding.  Covid-19 vaccine status: Information provided on how to obtain vaccines.   Qualifies for Shingles Vaccine? Yes   Zostavax completed No   Shingrix Completed?: No.    Education has been provided regarding the importance of this vaccine. Patient has been advised to call insurance company to determine out of pocket expense if they have not yet received this vaccine. Advised may also receive vaccine at local pharmacy or Health Dept. Verbalized acceptance and understanding.  Screening Tests Health Maintenance  Topic Date Due   COLONOSCOPY (Pts 45-49yrInsurance coverage will need to be confirmed)  Never done   MAMMOGRAM  Never done   DEXA SCAN  Never done   Zoster Vaccines- Shingrix (2 of 2) 04/27/2020   Pneumonia Vaccine 6551Years old (2 of 2 - PCV) 08/22/2022   COVID-19 Vaccine (7 - 2023-24 season) 09/22/2022   Medicare Annual Wellness  (AWV)  12/13/2023   DTaP/Tdap/Td (3 - Tdap) 02/05/2028   INFLUENZA VACCINE  Completed   Hepatitis C Screening  Completed   HPV VACCINES  Aged Out    Health Maintenance  Health Maintenance Due  Topic Date Due   COLONOSCOPY (Pts 45-4973yrnsurance coverage will need to be confirmed)  Never done   MAMMOGRAM  Never done   DEXA SCAN  Never done   Zoster Vaccines- Shingrix (2 of 2) 04/27/2020   Pneumonia Vaccine 65+63ears old (2 of 2 - PCV) 08/22/2022   COVID-19 Vaccine (7 - 2023-24 season) 09/22/2022    Colorectal cancer screening: Type of screening: Colonoscopy. Completed 2019. Repeat every 10 years  Mammogram status: Ordered appt April,2024. Pt provided with contact info and advised to call to schedule appt.   Lung Cancer Screening: (Low Dose CT Chest recommended if Age 76-29-80ars, 30 pack-year currently smoking OR have quit w/in 15years.) does not qualify.   Lung Cancer Screening Referral: No   Additional Screening:  Hepatitis C Screening: does not qualify; Completed Yes   Vision Screening: Recommended annual ophthalmology exams for early detection of glaucoma and other disorders of the eye. Is the patient up to date with their annual eye exam?  Yes  Who is the provider or what is the name of the office in which the patient attends annual eye exams? [facility]  If pt is not established with a provider, would they like to be referred to a provider to establish care? No .   Dental Screening: Recommended annual dental exams for proper oral hygiene  Community Resource Referral / Chronic Care Management: CRR required this visit?  No   CCM required this visit?  No     Plan:     I have personally reviewed and noted the following in the patient's chart:   Medical and social history Use of alcohol, tobacco or illicit drugs  Current medications and supplements including opioid prescriptions. Patient is not currently taking opioid prescriptions. Functional ability and  status Nutritional status Physical activity Advanced directives List of other physicians Hospitalizations, surgeries, and ER visits in previous 12 months Vitals Screenings to include cognitive, depression, and falls Referrals and appointments  In addition, I have reviewed and discussed with patient certain preventive protocols, quality metrics, and best practice recommendations. A written personalized care plan for preventive services as well as general preventive health recommendations were provided to patient.     Sandrea Hughs, NP   12/12/2022   Nurse Notes: Has appointment scheduled for mammogram April,2024  - Advised to get PNA 20 vaccine,shingles and COVID-19 vaccine at the pharmacy.   - states had Dexa scan with another provider.will obtain records.  - she will get her RSV vaccine at the Pharmacy   I connected with  [name] on 12/12/22 by a video ( used telephone Audio with video on since providers mic was not working ) enabled telemedicine application and verified that I am speaking with the correct person using two identifiers.   I discussed the limitations of evaluation and management by telemedicine. The patient expressed understanding and agreed to proceed.  Spent 24 minutes of face to face with patient  >50% time spent counseling; reviewing medical record; tests; and developing future plan of care.

## 2022-12-19 DIAGNOSIS — E039 Hypothyroidism, unspecified: Secondary | ICD-10-CM | POA: Diagnosis not present

## 2022-12-25 DIAGNOSIS — E039 Hypothyroidism, unspecified: Secondary | ICD-10-CM | POA: Diagnosis not present

## 2022-12-25 DIAGNOSIS — M67962 Unspecified disorder of synovium and tendon, left lower leg: Secondary | ICD-10-CM | POA: Diagnosis not present

## 2022-12-25 DIAGNOSIS — M67961 Unspecified disorder of synovium and tendon, right lower leg: Secondary | ICD-10-CM | POA: Diagnosis not present

## 2022-12-28 ENCOUNTER — Encounter (HOSPITAL_BASED_OUTPATIENT_CLINIC_OR_DEPARTMENT_OTHER): Payer: Self-pay | Admitting: Orthopedic Surgery

## 2022-12-28 ENCOUNTER — Other Ambulatory Visit: Payer: Self-pay

## 2022-12-29 DIAGNOSIS — L501 Idiopathic urticaria: Secondary | ICD-10-CM | POA: Diagnosis not present

## 2023-01-02 ENCOUNTER — Other Ambulatory Visit (HOSPITAL_COMMUNITY): Payer: Self-pay | Admitting: Orthopedic Surgery

## 2023-01-03 NOTE — Anesthesia Preprocedure Evaluation (Addendum)
Anesthesia Evaluation  Patient identified by MRN, date of birth, ID band Patient awake    Reviewed: Allergy & Precautions, H&P , NPO status , Patient's Chart, lab work & pertinent test results  History of Anesthesia Complications (+) PONV and history of anesthetic complications  Airway Mallampati: II  TM Distance: >3 FB Neck ROM: Full    Dental no notable dental hx.    Pulmonary asthma , sleep apnea    Pulmonary exam normal breath sounds clear to auscultation       Cardiovascular Normal cardiovascular exam+ dysrhythmias Supra Ventricular Tachycardia + Valvular Problems/Murmurs  Rhythm:Regular Rate:Normal  PFO. Presumed due to "Positive TCD Bubble study indicative of medium size right to left shunt"   Echo 02/2022   The study is normal. The study is low risk.   No ST deviation was noted.   LV perfusion is normal. There is no evidence of ischemia. There is no evidence of infarction.   Left ventricular function is normal. Nuclear stress EF: 61 %. The left ventricular ejection fraction is normal (55-65%). End diastolic cavity size is normal. End systolic cavity size is normal.   Prior study not available for comparison.      Neuro/Psych  Headaches  Neuromuscular disease    GI/Hepatic ,GERD  ,,  Endo/Other  Hypothyroidism    Renal/GU Renal disease     Musculoskeletal  (+) Arthritis ,  Fibromyalgia -  Abdominal   Peds  Hematology  (+) Blood dyscrasia, anemia   Anesthesia Other Findings   Reproductive/Obstetrics                             Anesthesia Physical Anesthesia Plan  ASA: 3  Anesthesia Plan: General   Post-op Pain Management: Tylenol PO (pre-op)* and Regional block*   Induction: Intravenous  PONV Risk Score and Plan: 4 or greater and Ondansetron, Dexamethasone, Midazolam and Treatment may vary due to age or medical condition  Airway Management Planned:  LMA  Additional Equipment:   Intra-op Plan:   Post-operative Plan: Extubation in OR  Informed Consent: I have reviewed the patients History and Physical, chart, labs and discussed the procedure including the risks, benefits and alternatives for the proposed anesthesia with the patient or authorized representative who has indicated his/her understanding and acceptance.     Dental advisory given  Plan Discussed with: CRNA  Anesthesia Plan Comments: (PAT note by Karoline Caldwell, PA-C from 12/01/2021: Previously followed with cardiologist Dr. Marlou Porch for paroxysmal atrial tachycardia, now well controlled on metoprolol.  In 2011 she had echocardiogram and nuclear stress which, per Dr. Marlou Porch notes, were reassuring showing normal EF, no ischemia, mild diastolic function.  Patient seen by her PCP Dr. Alain Honey 10/04/2021 for preop evaluation.  Patient doing well at that time, no acute concerns noted, cleared for surgery.  Preop labs reviewed, unremarkable.  EKG 12/01/2021: NSR.  Rate 74.  Carotid US 03/26/14: Summary: Bilateral: mild soft plaque origin ICA. 1-39% ICA stenosis. Vertebral artery flow is antegrade. ICA/CCA ratio: R-0.80 L-0.66.  Echo 07/11/10 [facility], now [facility]; scanned under Media tab, Correspondence 03/01/11): Conclusions: 1. Normal LV size and function. 2. There were no gross regional wall motion abnormalities. 3. Left ventricular ejection fraction estimated by 2D at 55 to 60%. 4. Mildly thickened mitral valve. Nomitral valve regurgitation. 5. Trivial tricuspid regurgitation. Normal estimated right ventricular systolic pressure. 6. Analysis of mitral valve inflow, pulmonary vein Doppler and tissue Doppler suggests grade 1 diastolic dysfunction  without elevated left atrial pressure.  According to 03/04/14 note by Dr. Marlou Porch, she "underwent echocardiogram and nuclear stress test on 07/11/10 both reassuring showing normal EF, no ischemia, mild diastolic  dysfunction."  )        Anesthesia Quick Evaluation

## 2023-01-03 NOTE — H&P (Signed)
[name] is an 67 y.o. female.   Chief Complaint: right ankle pain HPI: 67 y/o female with right ankle pain due to post tib tendonitis.  She has failed non op treatment and presents for surgery.  Past Medical History:  Diagnosis Date   Asthma    Back pain    Chronic fatigue syndrome    Dr. Sabra Heck   Complication of anesthesia    Cough    /SOB, PFT's nl, improved with Bronchodilation 8/11   Dysrhythmia    Endometrial cancer ([place])    Esophageal spasm    GERD Dr. Sabra Heck, Dr. Wynetta Emery; egd 2006 nl; UGI 3/13 Small HH, moderate GERD, nl motility; seen at [facility] [place] egd? neg, sone improvement on sucralfate susp   Fibromyalgia    Foot pain    Dr. Rushie Nyhan   Hashimoto's thyroiditis    High triglycerides    Hypothyroidism    Dr. Lewis Shock 4/14   Leukocytoclastic vasculitis ([place])    Dr. Tonia Brooms   Low HDL (under 40)    Memory loss    Mitral valve prolapse    Paroxysmal atrial tachycardia    Dr. Marlou Porch   Patent foramen ovale    Pernicious anemia    Dr. Sabra Heck   PONV (postoperative nausea and vomiting)    along time ago had nausea ans vomiting after surgery-25 years ago   Reflux    ? delayed gastric emptying--GES nl 01/2012 (6% at 2hrs)   RLS (restless legs syndrome)    low ferritin Dr. Sabra Heck   Rosacea    Sleep apnea    Uterine carcinoma [facility])    Dr. Polly Cobia    Past Surgical History:  Procedure Laterality Date   ABDOMINAL EXPOSURE N/A 12/07/2021   Procedure: ABDOMINAL EXPOSURE;  Surgeon: Marty Heck, MD;  Location: [place];  Service: Vascular;  Laterality: N/A;   ABDOMINAL HYSTERECTOMY     bladder tack     x 2   BUNIONECTOMY     CHOLECYSTECTOMY  2009   COLONOSCOPY     scr, 12/2008 ([place]) nl   COLOSTOMY Left 06/05/2018   Procedure: COLOSTOMY;  Surgeon: Ralene Ok, MD;  Location: [place];  Service: General;  Laterality: Left;   COLOSTOMY REVERSAL N/A 10/16/2018   Procedure: HARTMANN'S COLOSTOMY REVERSAL, RIGID PROCTOSCOPY;  Surgeon: Ralene Ok, MD;   Location: [facility];  Service: General;  Laterality: N/A;   HAMMER TOE SURGERY     HYSTERECTOMY ABDOMINAL WITH SALPINGO-OOPHORECTOMY  2008   [place]  04/09/2019   x2   [place] N/A 04/09/2019   Procedure: INCISIONAL HERNIA REPAIR WITH MESH;  Surgeon: Ralene Ok, MD;  Location: [place];  Service: General;  Laterality: N/A;   LAPAROSCOPY N/A 10/16/2018   Procedure: LAPAROSCOPY DIAGNOSTIC WITH LYSIS OF ADHESIONS;  Surgeon: Ralene Ok, MD;  Location[facility];  Service: General;  Laterality: N/A;   LUNG BIOPSY     L Lower lung pulm nodules, largest 4.26m, low risk, repeat CT in 1 yr now [facility] Stable tiny lung nodules, no f/u needed   LYMPH NODE DISSECTION  2008   LYSIS OF ADHESION N/A 04/09/2019   Procedure: OPEN LYSIS OF ADHESION;  Surgeon: RRalene Ok MD;  [place]  Service: General;  Laterality: N/A;   METATARSAL OSTEOTOMY     NECK SURGERY     x 2   OBLIQUE LUMBAR INTERBODY FUSION 1 LEVEL WITH PERCUTANEOUS SCREWS N/A 12/07/2021   Procedure: OBLIQUE LUMBAR INTERBODY  FUSION 1 LEVEL WITH PERCUTANEOUS SCREWS (OLIF L4-5 WITH POSTERIOR SPINAL FUSION AND PEDICLE SCREWS);  Surgeon: Melina Schools, MD;  Location: [place];  Service: Orthopedics;  Laterality: N/A;  4 HRS DR. CLARK TO DO APPROACH LEFT TAP BLOCK WITH EXPAREL 3 C-BED   PARTIAL COLECTOMY N/A 06/05/2018   Procedure: EXPLORATORY LAPAROTOMY AND PARTIAL COLECTOMY;  Surgeon: Ralene Ok, MD;  Location: [place];  Service: General;  Laterality: N/A;   TONSILLECTOMY     US ECHOCARDIOGRAPHY     with Nuclear test, Dr. Marlou Porch, Low risk   VESICOVAGINAL FISTULA CLOSURE W/ TAH      Family History  Problem Relation Age of Onset   Hashimoto's thyroiditis Mother    High blood pressure Mother    Stroke Mother    Other Mother        Borderline DM   Alzheimer's disease Mother    Ulcerative colitis Father    Meniere's disease Father    Hearing loss Father    Stroke  Father    Stroke Sister    Social History:  reports that she has never smoked. She has never used smokeless tobacco. She reports current alcohol use. She reports that she does not use drugs.  Allergies:  Allergies  Allergen Reactions   Aspirin Anaphylaxis    In higher doses   Bee Venom Shortness Of Breath, Itching, Swelling and Hives   Peanut-Containing Drug Products Anaphylaxis and Other (See Comments)   Ambien [Zolpidem Tartrate] Other (See Comments)    Hallucinations   Tape Itching, Swelling and Rash   Ciprofloxacin Nausea And Vomiting   Latex Rash   Minocycline Rash   Shellfish Allergy Rash    No medications prior to admission.    No results found for this or any previous visit (from the past 48 hour(s)). No results found.  Review of Systems  no recent f/c/n/v/wt loss  Height '5\' 5"'$  (1.651 m), weight 78.5 kg. Physical Exam  Wn wd woman in nad.  A and O x 4.  Normal mood and affect.  EOMI.  Resp unlabored.  L ankle ttp alon gthe PTT.  Healthy skin.  Palpable pulses.  No lymphadenopathy.  Heelcord is tight.  Assessment/Plan Right posterior tibial tendonitis, short achilles tendon - to the OR for gastroc recession, FDL transfer to the navicular, calc osteotomy and post tib tenolysis.  The risks and benefits of the alternative treatment options have been discussed in detail.  The patient wishes to proceed with surgery and specifically understands risks of bleeding, infection, nerve damage, blood clots, need for additional surgery, amputation and death.   Wylene Simmer, MD 2023/01/31, 4:24 PM

## 2023-01-04 ENCOUNTER — Other Ambulatory Visit: Payer: Self-pay

## 2023-01-04 ENCOUNTER — Ambulatory Visit (HOSPITAL_BASED_OUTPATIENT_CLINIC_OR_DEPARTMENT_OTHER): Payer: Medicare Other | Admitting: Anesthesiology

## 2023-01-04 ENCOUNTER — Encounter (HOSPITAL_BASED_OUTPATIENT_CLINIC_OR_DEPARTMENT_OTHER)
Admission: RE | Disposition: A | Discharge status: Home / Self Care | Payer: Self-pay | Source: Home / Self Care | Attending: Orthopedic Surgery

## 2023-01-04 ENCOUNTER — Ambulatory Visit (HOSPITAL_BASED_OUTPATIENT_CLINIC_OR_DEPARTMENT_OTHER): Payer: Medicare Other

## 2023-01-04 ENCOUNTER — Ambulatory Visit (HOSPITAL_BASED_OUTPATIENT_CLINIC_OR_DEPARTMENT_OTHER)
Admission: RE | Admit: 2023-01-04 | Discharge: 2023-01-04 | Disposition: A | Discharge status: Home / Self Care | Payer: Medicare Other | Attending: Orthopedic Surgery | Admitting: Orthopedic Surgery

## 2023-01-04 ENCOUNTER — Encounter (HOSPITAL_BASED_OUTPATIENT_CLINIC_OR_DEPARTMENT_OTHER): Payer: Self-pay | Admitting: Orthopedic Surgery

## 2023-01-04 DIAGNOSIS — M6701 Short Achilles tendon (acquired), right ankle: Secondary | ICD-10-CM | POA: Insufficient documentation

## 2023-01-04 DIAGNOSIS — J45909 Unspecified asthma, uncomplicated: Secondary | ICD-10-CM

## 2023-01-04 DIAGNOSIS — G4730 Sleep apnea, unspecified: Secondary | ICD-10-CM

## 2023-01-04 DIAGNOSIS — G8918 Other acute postprocedural pain: Secondary | ICD-10-CM | POA: Diagnosis not present

## 2023-01-04 DIAGNOSIS — K219 Gastro-esophageal reflux disease without esophagitis: Secondary | ICD-10-CM | POA: Insufficient documentation

## 2023-01-04 DIAGNOSIS — M216X1 Other acquired deformities of right foot: Secondary | ICD-10-CM | POA: Diagnosis not present

## 2023-01-04 DIAGNOSIS — Z01818 Encounter for other preprocedural examination: Secondary | ICD-10-CM

## 2023-01-04 DIAGNOSIS — S86111A Strain of other muscle(s) and tendon(s) of posterior muscle group at lower leg level, right leg, initial encounter: Secondary | ICD-10-CM | POA: Diagnosis not present

## 2023-01-04 DIAGNOSIS — M76821 Posterior tibial tendinitis, right leg: Secondary | ICD-10-CM

## 2023-01-04 HISTORY — PX: POSTERIOR TIBIAL TENDON REPAIR: SHX6039

## 2023-01-04 HISTORY — PX: GASTROCNEMIUS RECESSION: SHX863

## 2023-01-04 HISTORY — PX: CALCANEAL OSTEOTOMY: SHX1281

## 2023-01-04 SURGERY — RECESSION, MUSCLE, GASTROCNEMIUS
Anesthesia: General | Site: Leg Lower | Laterality: Right

## 2023-01-04 MED ORDER — DEXAMETHASONE SODIUM PHOSPHATE 4 MG/ML IJ SOLN: INTRAMUSCULAR | Status: DC | PRN

## 2023-01-04 MED ORDER — MIDAZOLAM HCL 2 MG/2ML IJ SOLN
INTRAMUSCULAR | Status: AC
  Filled 2023-01-04: qty 2

## 2023-01-04 MED ORDER — BUPIVACAINE HCL (PF) 0.5 % IJ SOLN
INTRAMUSCULAR | Status: AC
  Filled 2023-01-04: qty 30

## 2023-01-04 MED ORDER — CEFAZOLIN SODIUM-DEXTROSE 2-4 GM/100ML-% IV SOLN
INTRAVENOUS | Status: AC
  Filled 2023-01-04: qty 100

## 2023-01-04 MED ORDER — VANCOMYCIN HCL 500 MG IV SOLR
INTRAVENOUS | Status: DC | PRN
  Administered 2023-01-04: 500 mg via TOPICAL

## 2023-01-04 MED ORDER — PROPOFOL 500 MG/50ML IV EMUL
INTRAVENOUS | Status: DC | PRN
  Administered 2023-01-04: 35 ug/kg/min via INTRAVENOUS

## 2023-01-04 MED ORDER — DOCUSATE SODIUM 100 MG PO CAPS: 100.0000 mg | ORAL_CAPSULE | Freq: Two times a day (BID) | ORAL | 0 refills | Status: DC

## 2023-01-04 MED ORDER — LIDOCAINE HCL (CARDIAC) PF 100 MG/5ML IV SOSY
PREFILLED_SYRINGE | INTRAVENOUS | Status: DC | PRN
  Administered 2023-01-04: 30 mg via INTRAVENOUS

## 2023-01-04 MED ORDER — DEXAMETHASONE SODIUM PHOSPHATE 4 MG/ML IJ SOLN
INTRAMUSCULAR | Status: DC | PRN
  Administered 2023-01-04: 3 mg via PERINEURAL
  Administered 2023-01-04: 2 mg via PERINEURAL

## 2023-01-04 MED ORDER — AMISULPRIDE (ANTIEMETIC) 5 MG/2ML IV SOLN: 10.0000 mg | Freq: Once | INTRAVENOUS | Status: DC | PRN

## 2023-01-04 MED ORDER — ACETAMINOPHEN 500 MG PO TABS
ORAL_TABLET | ORAL | Status: AC
  Filled 2023-01-04: qty 2

## 2023-01-04 MED ORDER — CLONIDINE HCL (ANALGESIA) 100 MCG/ML EP SOLN
EPIDURAL | Status: DC | PRN
  Administered 2023-01-04: 30 ug
  Administered 2023-01-04: 50 ug

## 2023-01-04 MED ORDER — FENTANYL CITRATE (PF) 100 MCG/2ML IJ SOLN
50.0000 ug | Freq: Once | INTRAMUSCULAR | Status: AC
  Administered 2023-01-04: 50 ug via INTRAVENOUS

## 2023-01-04 MED ORDER — OXYCODONE HCL 5 MG PO TABS: 5.0000 mg | ORAL_TABLET | ORAL | 0 refills | Status: AC | PRN

## 2023-01-04 MED ORDER — SENNA 8.6 MG PO TABS: 2.0000 | ORAL_TABLET | Freq: Two times a day (BID) | ORAL | 0 refills | Status: DC

## 2023-01-04 MED ORDER — FENTANYL CITRATE (PF) 100 MCG/2ML IJ SOLN
INTRAMUSCULAR | Status: AC
  Filled 2023-01-04: qty 2

## 2023-01-04 MED ORDER — SODIUM CHLORIDE 0.9 % IV SOLN: INTRAVENOUS | Status: DC

## 2023-01-04 MED ORDER — DEXAMETHASONE SODIUM PHOSPHATE 10 MG/ML IJ SOLN
INTRAMUSCULAR | Status: DC | PRN
  Administered 2023-01-04: 4 mg via INTRAVENOUS

## 2023-01-04 MED ORDER — CEFAZOLIN SODIUM-DEXTROSE 2-4 GM/100ML-% IV SOLN
2.0000 g | INTRAVENOUS | Status: AC
  Administered 2023-01-04: 2 g via INTRAVENOUS

## 2023-01-04 MED ORDER — ACETAMINOPHEN 500 MG PO TABS
1000.0000 mg | ORAL_TABLET | Freq: Once | ORAL | Status: AC
  Administered 2023-01-04: 1000 mg via ORAL

## 2023-01-04 MED ORDER — PROPOFOL 10 MG/ML IV BOLUS
INTRAVENOUS | Status: DC | PRN
  Administered 2023-01-04: 150 mg via INTRAVENOUS

## 2023-01-04 MED ORDER — VANCOMYCIN HCL 500 MG IV SOLR
INTRAVENOUS | Status: AC
  Filled 2023-01-04: qty 10

## 2023-01-04 MED ORDER — 0.9 % SODIUM CHLORIDE (POUR BTL) OPTIME
TOPICAL | Status: DC | PRN
  Administered 2023-01-04: 300 mL

## 2023-01-04 MED ORDER — MIDAZOLAM HCL 2 MG/2ML IJ SOLN
1.0000 mg | Freq: Once | INTRAMUSCULAR | Status: AC
  Administered 2023-01-04: 1 mg via INTRAVENOUS

## 2023-01-04 MED ORDER — ONDANSETRON HCL 4 MG/2ML IJ SOLN
INTRAMUSCULAR | Status: DC | PRN
  Administered 2023-01-04: 4 mg via INTRAVENOUS

## 2023-01-04 MED ORDER — ROPIVACAINE HCL 5 MG/ML IJ SOLN
INTRAMUSCULAR | Status: DC | PRN
  Administered 2023-01-04: 25 mL via PERINEURAL
  Administered 2023-01-04: 15 mL via PERINEURAL

## 2023-01-04 MED ORDER — PHENYLEPHRINE HCL (PRESSORS) 10 MG/ML IV SOLN
INTRAVENOUS | Status: DC | PRN
  Administered 2023-01-04: 40 ug via INTRAVENOUS
  Administered 2023-01-04 (×2): 60 ug via INTRAVENOUS

## 2023-01-04 MED ORDER — FENTANYL CITRATE (PF) 100 MCG/2ML IJ SOLN: 25.0000 ug | INTRAMUSCULAR | Status: DC | PRN

## 2023-01-04 MED ORDER — LACTATED RINGERS IV SOLN: INTRAVENOUS | Status: DC

## 2023-01-04 MED ORDER — RIVAROXABAN 10 MG PO TABS: 10.0000 mg | ORAL_TABLET | Freq: Every day | ORAL | 0 refills | Status: DC

## 2023-01-04 SURGICAL SUPPLY — 95 items
APL PRP STRL LF DISP 70% ISPRP (MISCELLANEOUS) ×2
BANDAGE ESMARK 6X9 LF (GAUZE/BANDAGES/DRESSINGS) IMPLANT
BIT DRILL 2.4 AO COUPLING CANN (BIT) IMPLANT
BLADE AVERAGE 25X9 (BLADE) IMPLANT
BLADE MICRO SAGITTAL (BLADE) ×3 IMPLANT
BLADE SURG 15 STRL LF DISP TIS (BLADE) ×9 IMPLANT
BLADE SURG 15 STRL SS (BLADE) ×6
BNDG CMPR 5X62 HK CLSR LF (GAUZE/BANDAGES/DRESSINGS) ×2
BNDG CMPR 6"X 5 YARDS HK CLSR (GAUZE/BANDAGES/DRESSINGS) ×2
BNDG CMPR 9X4 STRL LF SNTH (GAUZE/BANDAGES/DRESSINGS)
BNDG CMPR 9X6 STRL LF SNTH (GAUZE/BANDAGES/DRESSINGS)
BNDG ELASTIC 4X5.8 VLCR STR LF (GAUZE/BANDAGES/DRESSINGS) ×3 IMPLANT
BNDG ELASTIC 6INX 5YD STR LF (GAUZE/BANDAGES/DRESSINGS) ×3 IMPLANT
BNDG ESMARK 4X9 LF (GAUZE/BANDAGES/DRESSINGS) IMPLANT
BNDG ESMARK 6X9 LF (GAUZE/BANDAGES/DRESSINGS)
BOOT STEPPER DURA LG (SOFTGOODS) IMPLANT
BOOT STEPPER DURA MED (SOFTGOODS) IMPLANT
BOOT STEPPER DURA XLG (SOFTGOODS) IMPLANT
CANISTER SUCT 1200ML W/VALVE (MISCELLANEOUS) ×3 IMPLANT
CHLORAPREP W/TINT 26 (MISCELLANEOUS) ×3 IMPLANT
COVER BACK TABLE 60X90IN (DRAPES) ×3 IMPLANT
CUFF TOURN SGL QUICK 30 NS (TOURNIQUET CUFF) IMPLANT
DRAPE EXTREMITY T 121X128X90 (DISPOSABLE) ×3 IMPLANT
DRAPE OEC MINIVIEW 54X84 (DRAPES) ×3 IMPLANT
DRAPE U-SHAPE 47X51 STRL (DRAPES) ×3 IMPLANT
DRSG MEPITEL 4X7.2 (GAUZE/BANDAGES/DRESSINGS) ×3 IMPLANT
ELECT REM PT RETURN 9FT ADLT (ELECTROSURGICAL) ×2
ELECTRODE REM PT RTRN 9FT ADLT (ELECTROSURGICAL) ×3 IMPLANT
GAUZE PAD ABD 8X10 STRL (GAUZE/BANDAGES/DRESSINGS) ×6 IMPLANT
GAUZE SPONGE 4X4 12PLY STRL (GAUZE/BANDAGES/DRESSINGS) ×3 IMPLANT
GLOVE BIOGEL PI IND STRL 8 (GLOVE) ×6 IMPLANT
GLOVE SURG SYN 8.0 (GLOVE) ×6 IMPLANT
GLOVE SURG SYN 8.0 PF PI (GLOVE) ×6 IMPLANT
GOWN STRL REUS W/ TWL LRG LVL3 (GOWN DISPOSABLE) ×3 IMPLANT
GOWN STRL REUS W/ TWL XL LVL3 (GOWN DISPOSABLE) ×6 IMPLANT
GOWN STRL REUS W/TWL LRG LVL3 (GOWN DISPOSABLE) ×2
GOWN STRL REUS W/TWL XL LVL3 (GOWN DISPOSABLE) ×4
K-WIRE DBL .062X4 NSTRL (WIRE)
K-WIRE TROC 1.25X150 (WIRE) ×2
KIT ACCESSORY DRILL 5 (KITS) IMPLANT
KWIRE DBL .062X4 NSTRL (WIRE) IMPLANT
KWIRE TROC 1.25X150 (WIRE) IMPLANT
NDL HYPO 22X1.5 SAFETY MO (MISCELLANEOUS) IMPLANT
NDL HYPO 25X1 1.5 SAFETY (NEEDLE) IMPLANT
NDL SUT 6 .5 CRC .975X.05 MAYO (NEEDLE) IMPLANT
NEEDLE HYPO 22X1.5 SAFETY MO (MISCELLANEOUS) IMPLANT
NEEDLE HYPO 25X1 1.5 SAFETY (NEEDLE) IMPLANT
NEEDLE MAYO TAPER (NEEDLE) ×2
NEEDLE SAFETY HYPO 22GAX1.5 (MISCELLANEOUS)
NS IRRIG 1000ML POUR BTL (IV SOLUTION) ×3 IMPLANT
PACK BASIN DAY SURGERY FS (CUSTOM PROCEDURE TRAY) ×3 IMPLANT
PAD CAST 4YDX4 CTTN HI CHSV (CAST SUPPLIES) ×3 IMPLANT
PADDING CAST ABS COTTON 4X4 ST (CAST SUPPLIES) IMPLANT
PADDING CAST COTTON 4X4 STRL (CAST SUPPLIES) ×2
PADDING CAST COTTON 6X4 STRL (CAST SUPPLIES) ×3 IMPLANT
PASSER SUT SWANSON 36MM LOOP (INSTRUMENTS) IMPLANT
PENCIL SMOKE EVACUATOR (MISCELLANEOUS) ×3 IMPLANT
SANITIZER HAND PURELL FF 515ML (MISCELLANEOUS) ×3 IMPLANT
SCOTCHCAST PLUS 4X4 WHITE (CAST SUPPLIES) IMPLANT
SCREW CANN 4.0X50 (Screw) ×4 IMPLANT
SCREW CANN PT 50X4 NS SM (Screw) IMPLANT
SCREW PEEK TENODESIS 6X12MM (Screw) IMPLANT
SHEET MEDIUM DRAPE 40X70 STRL (DRAPES) ×3 IMPLANT
SLEEVE SCD COMPRESS KNEE MED (STOCKING) ×3 IMPLANT
SPIKE FLUID TRANSFER (MISCELLANEOUS) IMPLANT
SPLINT PLASTER CAST FAST 5X30 (CAST SUPPLIES) ×60 IMPLANT
SPONGE T-LAP 18X18 ~~LOC~~+RFID (SPONGE) ×3 IMPLANT
STOCKINETTE 6  STRL (DRAPES) ×2
STOCKINETTE 6 STRL (DRAPES) ×3 IMPLANT
SUCTION FRAZIER HANDLE 10FR (MISCELLANEOUS) ×2
SUCTION TUBE FRAZIER 10FR DISP (MISCELLANEOUS) ×3 IMPLANT
SUT BONE WAX W31G (SUTURE) IMPLANT
SUT ETHIBOND 0 MO6 C/R (SUTURE) IMPLANT
SUT ETHIBOND 2 OS 4 DA (SUTURE) IMPLANT
SUT ETHILON 3 0 PS 1 (SUTURE) ×3 IMPLANT
SUT FIBERWIRE #2 38 T-5 BLUE (SUTURE)
SUT FIBERWIRE 2-0 18 17.9 3/8 (SUTURE)
SUT MERSILENE 2.0 SH NDLE (SUTURE) IMPLANT
SUT MNCRL AB 3-0 PS2 18 (SUTURE) ×3 IMPLANT
SUT MNCRL AB 4-0 PS2 18 (SUTURE) IMPLANT
SUT VIC AB 2-0 SH 18 (SUTURE) IMPLANT
SUT VIC AB 2-0 SH 27 (SUTURE)
SUT VIC AB 2-0 SH 27XBRD (SUTURE) IMPLANT
SUT VICRYL 0 SH 27 (SUTURE) ×3 IMPLANT
SUT VICRYL 0 UR6 27IN ABS (SUTURE) IMPLANT
SUTURE FIBERWR #2 38 T-5 BLUE (SUTURE) IMPLANT
SUTURE FIBERWR 2-0 18 17.9 3/8 (SUTURE) IMPLANT
SUTURE TAPE 1.3 FIBERLOP 20 ST (SUTURE) IMPLANT
SUTURETAPE 1.3 FIBERLOOP 20 ST (SUTURE)
SYR BULB EAR ULCER 3OZ GRN STR (SYRINGE) ×3 IMPLANT
SYR CONTROL 10ML LL (SYRINGE) IMPLANT
TOWEL GREEN STERILE FF (TOWEL DISPOSABLE) ×6 IMPLANT
TUBE CONNECTING 20X1/4 (TUBING) ×3 IMPLANT
UNDERPAD 30X36 HEAVY ABSORB (UNDERPADS AND DIAPERS) ×3 IMPLANT
YANKAUER SUCT BULB TIP NO VENT (SUCTIONS) IMPLANT

## 2023-01-04 NOTE — Transfer of Care (Signed)
Immediate Anesthesia Transfer of Care Note  Patient: [name]  Procedure(s) Performed: GASTROCNEMIUS recession (Right: Leg Lower) POSTERIOR TIBIAL TENDON TENOLYSIS AND  flexor digitorum longus TRANSFER TO NAVICULAR (Right: Foot) CALCANEAL OSTEOTOMY (Right: Foot)  Patient Location: PACU  Anesthesia Type:GA combined with regional for post-op pain  Level of Consciousness: drowsy and patient cooperative  Airway & Oxygen Therapy: Patient Spontanous Breathing and Patient connected to face mask oxygen  Post-op Assessment: Report given to RN and Post -op Vital signs reviewed and stable  Post vital signs: Reviewed and stable  Last Vitals:  Vitals Value Taken Time  BP    Temp    Pulse 73 01/04/23 0901  Resp    SpO2 99 % 01/04/23 0901    Last Pain:  Vitals:   01/04/23 0634  TempSrc: Temporal  PainSc: 6       Patients Stated Pain Goal: 4 ([id number])  Complications: No notable events documented.

## 2023-01-04 NOTE — Anesthesia Procedure Notes (Signed)
Anesthesia Regional Block: Popliteal block   Pre-Anesthetic Checklist: , timeout performed,  Correct Patient, Correct Site, Correct Laterality,  Correct Procedure, Correct Position, site marked,  Risks and benefits discussed,  Surgical consent,  Pre-op evaluation,  At surgeon's request and post-op pain management  Laterality: Lower and Right  Prep: chloraprep       Needles:  Injection technique: Single-shot  Needle Type: Stimiplex     Needle Length: 10cm  Needle Gauge: 21     Additional Needles:   Procedures:,,,, ultrasound used (permanent image in chart),,   Motor weakness within 5 minutes.  Narrative:  Start time: 01/04/2023 7:16 AM End time: 01/04/2023 7:21 AM Injection made incrementally with aspirations every 5 mL.  Performed by: Personally  Anesthesiologist: Nolon Nations, MD  Additional Notes: Nerve located and needle positioned with direct ultrasound guidance. Good perineural spread. Patient tolerated well.

## 2023-01-04 NOTE — Discharge Instructions (Addendum)
Jill Simmer, MD [facility]  Please read the following information regarding your care after surgery.  Medications  You only need a prescription for the narcotic pain medicine (ex. oxycodone, Percocet, Norco).  All of the other medicines listed below are available over the counter. ? Aleve 2 pills twice a day for the first 3 days after surgery. ? acetominophen (Tylenol) 650 mg every 4-6 hours as you need for minor to moderate pain ? oxycodone as prescribed for severe pain  Narcotic pain medicine (ex. oxycodone, Percocet, Vicodin) will cause constipation.  To prevent this problem, take the following medicines while you are taking any pain medicine. ? docusate sodium (Colace) 100 mg twice a day ? senna (Senokot) 2 tablets twice a day  ? To help prevent blood clots, take Xarelto as prescribed for two weeks after surgery.  You should also get up every hour while you are awake to move around.    Weight Bearing ? Do not bear any weight on the operated leg or foot.  Cast / Splint / Dressing ? Keep your splint, cast or dressing clean and dry.  Don't put anything (coat hanger, pencil, etc) down inside of it.  If it gets damp, use a hair dryer on the cool setting to dry it.  If it gets soaked, call the office to schedule an appointment for a cast change.   After your dressing, cast or splint is removed; you may shower, but do not soak or scrub the wound.  Allow the water to run over it, and then gently pat it dry.  Swelling It is normal for you to have swelling where you had surgery.  To reduce swelling and pain, keep your toes above your nose for at least 3 days after surgery.  It may be necessary to keep your foot or leg elevated for several weeks.  If it hurts, it should be elevated.  Follow Up Call my office at [phone] when you are discharged from the hospital or surgery center to schedule an appointment to be seen two weeks after surgery.  Call my office at [phone] if you develop  a fever >101.5 F, nausea, vomiting, bleeding from the surgical site or severe pain.      Post Anesthesia Home Care Instructions  Activity: Get plenty of rest for the remainder of the day. A responsible individual must stay with you for 24 hours following the procedure.  For the next 24 hours, DO NOT: -Drive a car -Paediatric nurse -Drink alcoholic beverages -Take any medication unless instructed by your physician -Make any legal decisions or sign important papers.  Meals: Start with liquid foods such as gelatin or soup. Progress to regular foods as tolerated. Avoid greasy, spicy, heavy foods. If nausea and/or vomiting occur, drink only clear liquids until the nausea and/or vomiting subsides. Call your physician if vomiting continues.  Special Instructions/Symptoms: Your throat may feel dry or sore from the anesthesia or the breathing tube placed in your throat during surgery. If this causes discomfort, gargle with warm salt water. The discomfort should disappear within 24 hours.   Regional Anesthesia Blocks  1. Numbness or the inability to move the "blocked" extremity may last from 3-48 hours after placement. The length of time depends on the medication injected and your individual response to the medication. If the numbness is not going away after 48 hours, call your surgeon.  2. The extremity that is blocked will need to be protected until the numbness is gone and the  Strength  has returned. Because you cannot feel it, you will need to take extra care to avoid injury. Because it may be weak, you may have difficulty moving it or using it. You may not know what position it is in without looking at it while the block is in effect.  3. For blocks in the legs and feet, returning to weight bearing and walking needs to be done carefully. You will need to wait until the numbness is entirely gone and the strength has returned. You should be able to move your leg and foot normally before you try  and bear weight or walk. You will need someone to be with you when you first try to ensure you do not fall and possibly risk injury.  4. Bruising and tenderness at the needle site are common side effects and will resolve in a few days.  5. Persistent numbness or new problems with movement should be communicated to the surgeon or the [place] [phone] [place] [phone]).  Tylenol can be taken if needed after 12:38pm

## 2023-01-04 NOTE — Anesthesia Procedure Notes (Signed)
Procedure Name: LMA Insertion Date/Time: 01/04/2023 7:39 AM  Performed by: Mikyle Sox, Ernesta Amble, CRNAPre-anesthesia Checklist: Patient identified, Emergency Drugs available, Suction available and Patient being monitored Patient Re-evaluated:Patient Re-evaluated prior to induction Oxygen Delivery Method: Circle system utilized Preoxygenation: Pre-oxygenation with 100% oxygen Induction Type: IV induction Ventilation: Mask ventilation without difficulty LMA: LMA inserted LMA Size: 4.0 Number of attempts: 1 Airway Equipment and Method: Bite block Placement Confirmation: positive ETCO2 Tube secured with: Tape Dental Injury: Teeth and Oropharynx as per pre-operative assessment

## 2023-01-04 NOTE — Anesthesia Procedure Notes (Signed)
Anesthesia Regional Block: Adductor canal block   Pre-Anesthetic Checklist: , timeout performed,  Correct Patient, Correct Site, Correct Laterality,  Correct Procedure, Correct Position, site marked,  Risks and benefits discussed,  Surgical consent,  Pre-op evaluation,  At surgeon's request and post-op pain management  Laterality: Lower and Right  Prep: chloraprep       Needles:  Injection technique: Single-shot  Needle Type: Stimiplex     Needle Length: 9cm  Needle Gauge: 21     Additional Needles:   Procedures:,,,, ultrasound used (permanent image in chart),,    Narrative:  Start time: 01/04/2023 7:01 AM End time: 01/04/2023 7:16 AM Injection made incrementally with aspirations every 5 mL.  Performed by: Personally  Anesthesiologist: Nolon Nations, MD  Additional Notes: BP cuff, EKG monitors applied. Sedation begun. Artery and nerve location verified with ultrasound. Anesthetic injected incrementally (89m), slowly, and after negative aspirations under direct u/s guidance. Good fascial/perineural spread. Tolerated well.

## 2023-01-04 NOTE — Op Note (Signed)
01/04/2023  9:06 AM  PATIENT:  [name]  67 y.o. female  PRE-OPERATIVE DIAGNOSIS: 1.  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  Short right Achilles tendon  POST-OPERATIVE DIAGNOSIS: Same  Procedure(s): 1.  Right gastrocnemius recession 2.  Right medializing calcaneus osteotomy 3.  Right posterior tibial tendon repair  4.  Deep transfer of the right flexor digitorum longus tendon to the navicular 5   AP, lateral and Harris heel radiographs of the right foot  SURGEON:  Wylene Simmer, MD  ASSISTANT: Mechele Claude, PA-C  ANESTHESIA:   General, regional  EBL:  minimal   TOURNIQUET:   Total Tourniquet Time Documented: Thigh (Right) - 61 minutes Total: Thigh (Right) - 61 minutes  COMPLICATIONS:  None apparent  DISPOSITION:  Extubated, awake and stable to recovery.  INDICATION FOR PROCEDURE: 67 year old female without significant past medical history complains of worsening right ankle and hindfoot pain over the last several years.  She has signs and symptoms of posterior tibial tendon dysfunction and a tight heel cord.  She has failed nonoperative treatment and presents today for surgical treatment of this painful and limiting condition.  The risks and benefits of the alternative treatment options have been discussed in detail.  The patient wishes to proceed with surgery and specifically understands risks of bleeding, infection, nerve damage, blood clots, need for additional surgery, amputation and death.   PROCEDURE IN DETAIL:  After pre operative consent was obtained, and the correct operative site was identified, the patient was brought to the operating room and placed supine on the OR table.  Anesthesia was administered.  Pre-operative antibiotics were administered.  A surgical timeout was taken.  The right lower extremity was prepped and draped in standard sterile fashion with a tourniquet around the thigh.  The extremity was elevated, and the tourniquet was inflated  to 250 mmHg.  An incision was made along the medial calf.  Dissection was carried sharply down through the subcutaneous tissues.  The fascia was incised.  The gastrocnemius tendon was identified.  The sural nerve was protected.  The tendon was divided from medial to lateral under direct vision.  The wound was irrigated and sprinkled with vancomycin powder.  Subcutaneous tissues were approximated with Monocryl.  The skin incision was closed with nylon.  Attention was turned to the lateral aspect of the hindfoot.  An incision was made over the lateral wall of the calcaneus.  Dissection was carried sharply down through the subcutaneous tissues.  The periosteum was incised and elevated.  A K wire was inserted at the osteotomy site.  A radiograph confirmed appropriate position of the K wire.  The osteotomy was made with the oscillating saw protecting the surrounding soft tissues.  The tuberosity was mobilized and translated medially a few millimeters.  The osteotomy was then fixed with two 4 mm partially-threaded cannulated screws from the [employer].  The heel incisions were closed with nylon.  The osteotomy site was smoothed with a bone tamp.  The wound was irrigated and sprinkled with vancomycin powder.  The incision was closed with nylon.  Attention was turned to the medial hindfoot.  An incision was made along the posterior tibial tendon.  Dissection was carried sharply down through the subcutaneous tissues.  The tendon sheath was incised.  There was tenosynovitis noted along the posterior tibial tendon and within the tendon sheath.  The posterior tibial tendon was noted to be delaminated distally at its insertion at the medial navicular.  This  tear was debrided.  An incision was then made at the floor of the posterior tibial tendon sheath exposing the flexor digitorum longus tendon.  Dissection was carried down distally to the knot of Mallie Mussel.  The tendon was transected and a whipstitch placed in the end  of the tendon.  A K wire was then inserted into the navicular tuberosity.  Radiographs confirmed appropriate position of the wire.  The wire was overdrilled with a 5 mm reamer.  The tendon was pulled up through the hole and secured with a 6 mm [facility] medical bolt.  The suture ends were then approximated to the adjacent periosteum with free needles.  Plantarly the FDL was sutured to the plantar periosteum of the navicular as well.  The posterior tibial tendon was then repaired by suturing the delaminated portion back to the adjacent periosteum and spring ligament.  The wound was irrigated copiously and sprinkled with vancomycin powder.  The tendon sheath was repaired with 0 Vicryl.  Subcutaneous tissues were approximated with Monocryl.  The skin incision was closed with nylon.  Sterile dressings were applied followed by a well-padded short leg splint.  The tourniquet was released after application of the dressings.  The patient was awakened from anesthesia and transported to the recovery room in stable condition.  FOLLOW UP PLAN: Nonweightbearing on the right lower extremity.  Follow-up in the office in 2 weeks for suture removal and conversion to a short leg cast.  Xarelto for DVT prophylaxis.   RADIOGRAPHS: AP, lateral and Harris heel radiographs of the right foot were obtained intraoperatively.  These show interval medialization of the calcaneus and appropriate position of the screws.  Appropriate position of the FDL transfer position was also noted.  No other acute injuries are noted.    Mechele Claude PA-C was present and scrubbed for the duration of the operative case. His assistance was essential in positioning the patient, prepping and draping, gaining and maintaining exposure, performing the operation, closing and dressing the wounds and applying the splint.

## 2023-01-04 NOTE — Anesthesia Postprocedure Evaluation (Signed)
Anesthesia Post Note  Patient: [name]  Procedure(s) Performed: GASTROCNEMIUS recession (Right: Leg Lower) POSTERIOR TIBIAL TENDON TENOLYSIS AND  flexor digitorum longus TRANSFER TO NAVICULAR (Right: Foot) CALCANEAL OSTEOTOMY (Right: Foot)     Patient location during evaluation: PACU Anesthesia Type: General Level of consciousness: sedated and patient cooperative Pain management: pain level controlled Vital Signs Assessment: post-procedure vital signs reviewed and stable Respiratory status: spontaneous breathing Cardiovascular status: stable Anesthetic complications: no   No notable events documented.  Last Vitals:  Vitals:   01/04/23 0930 01/04/23 0940  BP: (!) 138/59 (!) 149/76  Pulse: 72 77  Resp: 10 12  Temp:  36.4 C  SpO2: 98% 96%    Last Pain:  Vitals:   01/04/23 0940  TempSrc:   PainSc: 0-No pain                 Nolon Nations

## 2023-01-04 NOTE — Progress Notes (Signed)
Assisted Dr. Lissa Hoard with right, adductor canal, popliteal, ultrasound guided block. Side rails up, monitors on throughout procedure. See vital signs in flow sheet. Tolerated Procedure well.

## 2023-01-05 ENCOUNTER — Encounter (HOSPITAL_BASED_OUTPATIENT_CLINIC_OR_DEPARTMENT_OTHER): Payer: Self-pay | Admitting: Orthopedic Surgery

## 2023-01-19 DIAGNOSIS — M76821 Posterior tibial tendinitis, right leg: Secondary | ICD-10-CM | POA: Diagnosis not present

## 2023-01-19 DIAGNOSIS — Z4889 Encounter for other specified surgical aftercare: Secondary | ICD-10-CM | POA: Diagnosis not present

## 2023-02-16 DIAGNOSIS — Z4889 Encounter for other specified surgical aftercare: Secondary | ICD-10-CM | POA: Diagnosis not present

## 2023-02-16 DIAGNOSIS — M76821 Posterior tibial tendinitis, right leg: Secondary | ICD-10-CM | POA: Diagnosis not present

## 2023-02-23 DIAGNOSIS — E039 Hypothyroidism, unspecified: Secondary | ICD-10-CM | POA: Diagnosis not present

## 2023-02-23 DIAGNOSIS — L501 Idiopathic urticaria: Secondary | ICD-10-CM | POA: Diagnosis not present

## 2023-03-05 NOTE — Telephone Encounter (Signed)
Nothing else follows

## 2023-03-16 DIAGNOSIS — Z4889 Encounter for other specified surgical aftercare: Secondary | ICD-10-CM | POA: Diagnosis not present

## 2023-03-16 DIAGNOSIS — M76821 Posterior tibial tendinitis, right leg: Secondary | ICD-10-CM | POA: Diagnosis not present

## 2023-03-19 ENCOUNTER — Ambulatory Visit: Admit: 2023-03-19 | Discharge: 2023-03-20 | Payer: MEDICARE

## 2023-03-19 DIAGNOSIS — L719 Rosacea, unspecified: Principal | ICD-10-CM

## 2023-03-19 DIAGNOSIS — L958 Other vasculitis limited to the skin: Principal | ICD-10-CM

## 2023-03-19 DIAGNOSIS — Z79899 Other long term (current) drug therapy: Principal | ICD-10-CM

## 2023-03-19 MED ORDER — DOXYCYCLINE HYCLATE 100 MG TABLET
ORAL_TABLET | Freq: Two times a day (BID) | ORAL | 2 refills | 14.00000 days | Status: CP
Start: 2023-03-19 — End: 2023-04-02

## 2023-03-19 MED ORDER — METRONIDAZOLE 0.75 % TOPICAL GEL
TOPICAL | 5 refills | 0.00000 days | Status: CP
Start: 2023-03-19 — End: ?

## 2023-03-30 DIAGNOSIS — L719 Rosacea, unspecified: Principal | ICD-10-CM

## 2023-03-30 MED ORDER — METRONIDAZOLE 0.75 % TOPICAL GEL
5 refills | 0.00000 days
Start: 2023-03-30 — End: ?

## 2023-04-01 MED ORDER — METRONIDAZOLE 0.75 % TOPICAL GEL
TOPICAL | 5 refills | 0.00000 days | Status: CP
Start: 2023-04-01 — End: ?

## 2023-04-02 DIAGNOSIS — L501 Idiopathic urticaria: Secondary | ICD-10-CM | POA: Diagnosis not present

## 2023-04-03 DIAGNOSIS — Z79899 Other long term (current) drug therapy: Secondary | ICD-10-CM | POA: Diagnosis not present

## 2023-04-09 DIAGNOSIS — G4733 Obstructive sleep apnea (adult) (pediatric): Secondary | ICD-10-CM | POA: Diagnosis not present

## 2023-04-10 ENCOUNTER — Other Ambulatory Visit: Payer: Self-pay | Admitting: Cardiology

## 2023-04-11 DIAGNOSIS — M25571 Pain in right ankle and joints of right foot: Secondary | ICD-10-CM | POA: Diagnosis not present

## 2023-04-12 DIAGNOSIS — J3081 Allergic rhinitis due to animal (cat) (dog) hair and dander: Secondary | ICD-10-CM | POA: Diagnosis not present

## 2023-04-12 DIAGNOSIS — J4540 Moderate persistent asthma, uncomplicated: Secondary | ICD-10-CM | POA: Diagnosis not present

## 2023-04-12 DIAGNOSIS — Z91010 Allergy to peanuts: Secondary | ICD-10-CM | POA: Diagnosis not present

## 2023-04-12 DIAGNOSIS — J3089 Other allergic rhinitis: Secondary | ICD-10-CM | POA: Diagnosis not present

## 2023-04-13 ENCOUNTER — Ambulatory Visit
Admission: RE | Admit: 2023-04-13 | Discharge: 2023-04-13 | Disposition: A | Discharge status: Home / Self Care | Payer: Medicare Other | Source: Ambulatory Visit | Attending: Allergy and Immunology | Admitting: Allergy and Immunology

## 2023-04-13 ENCOUNTER — Other Ambulatory Visit: Payer: Self-pay | Admitting: Allergy and Immunology

## 2023-04-13 DIAGNOSIS — J4540 Moderate persistent asthma, uncomplicated: Secondary | ICD-10-CM

## 2023-04-13 DIAGNOSIS — M76821 Posterior tibial tendinitis, right leg: Secondary | ICD-10-CM | POA: Diagnosis not present

## 2023-04-13 DIAGNOSIS — R0602 Shortness of breath: Secondary | ICD-10-CM | POA: Diagnosis not present

## 2023-04-13 DIAGNOSIS — R0789 Other chest pain: Secondary | ICD-10-CM | POA: Diagnosis not present

## 2023-04-16 DIAGNOSIS — Z01419 Encounter for gynecological examination (general) (routine) without abnormal findings: Secondary | ICD-10-CM | POA: Diagnosis not present

## 2023-04-16 DIAGNOSIS — Z780 Asymptomatic menopausal state: Secondary | ICD-10-CM | POA: Diagnosis not present

## 2023-04-16 DIAGNOSIS — N952 Postmenopausal atrophic vaginitis: Secondary | ICD-10-CM | POA: Diagnosis not present

## 2023-04-17 ENCOUNTER — Ambulatory Visit: Payer: Medicare Other | Admitting: Family

## 2023-04-18 DIAGNOSIS — M25571 Pain in right ankle and joints of right foot: Secondary | ICD-10-CM | POA: Diagnosis not present

## 2023-04-20 DIAGNOSIS — M25571 Pain in right ankle and joints of right foot: Secondary | ICD-10-CM | POA: Diagnosis not present

## 2023-04-24 DIAGNOSIS — M25571 Pain in right ankle and joints of right foot: Secondary | ICD-10-CM | POA: Diagnosis not present

## 2023-04-26 DIAGNOSIS — M25571 Pain in right ankle and joints of right foot: Secondary | ICD-10-CM | POA: Diagnosis not present

## 2023-04-27 DIAGNOSIS — E039 Hypothyroidism, unspecified: Secondary | ICD-10-CM | POA: Diagnosis not present

## 2023-04-27 DIAGNOSIS — L501 Idiopathic urticaria: Secondary | ICD-10-CM | POA: Diagnosis not present

## 2023-05-01 ENCOUNTER — Other Ambulatory Visit: Payer: Self-pay

## 2023-05-01 MED ORDER — BUPROPION HCL ER (XL) 150 MG PO TB24: 450.0000 mg | ORAL_TABLET | Freq: Every day | ORAL | 1 refills | Status: DC

## 2023-05-01 NOTE — Telephone Encounter (Signed)
Patient called requesting a refill on Wellbutrin 150 MG but medication never filled by Dr. Jacalyn Lefevre. Medication is on active med list.

## 2023-05-02 DIAGNOSIS — M25571 Pain in right ankle and joints of right foot: Secondary | ICD-10-CM | POA: Diagnosis not present

## 2023-05-07 DIAGNOSIS — M25571 Pain in right ankle and joints of right foot: Secondary | ICD-10-CM | POA: Diagnosis not present

## 2023-05-09 DIAGNOSIS — M25571 Pain in right ankle and joints of right foot: Secondary | ICD-10-CM | POA: Diagnosis not present

## 2023-05-14 DIAGNOSIS — M25571 Pain in right ankle and joints of right foot: Secondary | ICD-10-CM | POA: Diagnosis not present

## 2023-05-16 DIAGNOSIS — Z1231 Encounter for screening mammogram for malignant neoplasm of breast: Secondary | ICD-10-CM | POA: Diagnosis not present

## 2023-05-16 LAB — HM MAMMOGRAPHY

## 2023-05-25 DIAGNOSIS — M76821 Posterior tibial tendinitis, right leg: Secondary | ICD-10-CM | POA: Diagnosis not present

## 2023-05-29 DIAGNOSIS — L501 Idiopathic urticaria: Secondary | ICD-10-CM | POA: Diagnosis not present

## 2023-05-30 ENCOUNTER — Encounter: Payer: Self-pay | Admitting: Family Medicine

## 2023-05-30 ENCOUNTER — Ambulatory Visit (INDEPENDENT_AMBULATORY_CARE_PROVIDER_SITE_OTHER): Payer: Medicare Other | Admitting: Family Medicine

## 2023-05-30 VITALS — BP 128/74 | HR 64 | Temp 97.1°F | Resp 18 | Ht 65.0 in | Wt 164.8 lb

## 2023-05-30 DIAGNOSIS — M797 Fibromyalgia: Secondary | ICD-10-CM | POA: Diagnosis not present

## 2023-05-30 DIAGNOSIS — E039 Hypothyroidism, unspecified: Secondary | ICD-10-CM

## 2023-05-30 DIAGNOSIS — J4540 Moderate persistent asthma, uncomplicated: Secondary | ICD-10-CM | POA: Diagnosis not present

## 2023-05-30 DIAGNOSIS — G9332 Myalgic encephalomyelitis/chronic fatigue syndrome: Secondary | ICD-10-CM | POA: Diagnosis not present

## 2023-05-30 NOTE — Progress Notes (Signed)
Provider:  Jacalyn Lefevre, MD  Careteam: Patient Care Team: [name], MD as PCP - General (Family Medicine) Jake Bathe, MD as PCP - Cardiology (Cardiology) Glyn Ade, PA-C as Physician Assistant (Dermatology) Eileen Stanford, MD as Referring Physician (Allergy and Immunology) Bernette Redbird, MD as Consulting Physician (Gastroenterology) Axel Filler, MD as Consulting Physician (General Surgery) Venita Lick, MD as Consulting Physician (Orthopedic Surgery) Inis Sizer, MD (Internal Medicine) Regal, Kirstie Peri, DPM as Consulting Physician (Podiatry) Dorisann Frames, MD as Referring Physician (Endocrinology) Essie Hart, MD (Inactive) as Referring Physician (Obstetrics and Gynecology) Golda Acre, OD (Optometry) Barron Alvine, MD (Inactive) (Urology) Krystal Clark, MD as Referring Physician (Rheumatology)  PLACE OF SERVICE:  [facility]  Advanced Directive information Does Patient Have a Medical Advance Directive?: Yes, Would patient like information on creating a medical advance directive?: No - Patient declined, Type of Advance Directive: Living will, Does patient want to make changes to medical advance directive?: No - Patient declined  Allergies  Allergen Reactions   Aspirin Anaphylaxis    In higher doses   Bee Venom Shortness Of Breath, Itching, Swelling and Hives   Peanut-Containing Drug Products Anaphylaxis and Other (See Comments)   Ambien [Zolpidem Tartrate] Other (See Comments)    Hallucinations   Tape Itching, Swelling and Rash   Ciprofloxacin Nausea And Vomiting   Latex Rash   Minocycline Rash   Shellfish Allergy Rash    Chief Complaint  Patient presents with   Medical Management of Chronic Issues    Patient is here for a 24M follow up for chronic conditions      HPI: Patient is a 67 y.o. female .  [name] is here today to follow-up chronic problems: Chronic fatigue syndrome, fibromyalgia, idiopathic urticaria, asthma. Since  her last visit she has undergone Achilles tendon reconstructive surgery and is just finished rehabbing that and is doing well.  She was able to maintain and even lose a few pounds during the period of inactivity and still feels very good about her weight on the Noom diet app. She had to postpone her study participation in [place] for Alzheimer's dementia but I have encouraged her to continue with the.  Her mother did have Alzheimer's. She is doing good in terms of her fatigue syndrome and chronic pain syndrome with fibromyalgia.  She is still in a good place medication wise with bupropion and Abilify.  She did relate several anxiety producing issues that she is dealing with. Asthma stable.  Using trilogy as needed No problems with supraventricular tachycardia on beta-blocker metoprolol.  Review of Systems:  Review of Systems  Constitutional: Negative.   Respiratory: Negative.    Cardiovascular: Negative.   Gastrointestinal: Negative.   Genitourinary: Negative.   Musculoskeletal:  Positive for myalgias.  Neurological: Negative.   Psychiatric/Behavioral: Negative.    All other systems reviewed and are negative.   Past Medical History:  Diagnosis Date   Asthma    Back pain    Chronic fatigue syndrome    Dr. Hyacinth Meeker   Complication of anesthesia    Cough    /SOB, PFT's nl, improved with Bronchodilation 8/11   Dysrhythmia    Endometrial cancer (HCC)    Esophageal spasm    GERD Dr. Hyacinth Meeker, Dr. Laural Benes; egd 2006 nl; UGI 3/13 [facility], moderate GERD, nl motility; seen at [facility] ([place]), egd? neg, sone improvement on sucralfate susp   Fibromyalgia    Foot pain    Dr. Netta Corrigan   Hashimoto's thyroiditis  High triglycerides    Hypothyroidism    Dr. Wynne Dust 4/14   Leukocytoclastic vasculitis (HCC)    Dr. Danella Deis   Low HDL (under 40)    Memory loss    Mitral valve prolapse    Paroxysmal atrial tachycardia    Dr. Anne Fu   Patent foramen ovale    Pernicious anemia    Dr. Hyacinth Meeker    PONV (postoperative nausea and vomiting)    along time ago had nausea ans vomiting after surgery-25 years ago   Reflux    ? delayed gastric emptying--GES nl 01/2012 (6% at 2hrs)   RLS (restless legs syndrome)    low ferritin Dr. Hyacinth Meeker   Rosacea    Sleep apnea    Uterine carcinoma [facility])    Dr. Clifton James   Past Surgical History:  Procedure Laterality Date   ABDOMINAL EXPOSURE N/A 12/07/2021   Procedure: ABDOMINAL EXPOSURE;  Surgeon: Cephus Shelling, MD;  Location: [facility] OR;  Service: Vascular;  Laterality: N/A;   ABDOMINAL HYSTERECTOMY     bladder tack     x 2   BUNIONECTOMY     CALCANEAL OSTEOTOMY Right 01/04/2023   Procedure: CALCANEAL OSTEOTOMY;  Surgeon: Toni Arthurs, MD;  Location: [facility];  Service: Orthopedics;  Laterality: Right;   CHOLECYSTECTOMY  2009   COLONOSCOPY     scr, 12/2008 ([facility]) nl   COLOSTOMY Left 06/05/2018   Procedure: COLOSTOMY;  Surgeon: Axel Filler, MD;  Location: [facility] OR;  Service: General;  Laterality: Left;   COLOSTOMY REVERSAL N/A 10/16/2018   Procedure: HARTMANN'S COLOSTOMY REVERSAL, RIGID PROCTOSCOPY;  Surgeon: Axel Filler, MD;  Location: [facility] ORS;  Service: General;  Laterality: N/A;   GASTROCNEMIUS RECESSION Right 01/04/2023   Procedure: GASTROCNEMIUS recession;  Surgeon: Toni Arthurs, MD;  Location: [facility];  Service: Orthopedics;  Laterality: Right;   HAMMER TOE SURGERY     HYSTERECTOMY ABDOMINAL WITH SALPINGO-OOPHORECTOMY  2008   INCISIONAL HERNIA REPAIR  04/09/2019   x2   INCISIONAL HERNIA REPAIR N/A 04/09/2019   Procedure: INCISIONAL HERNIA REPAIR WITH MESH;  Surgeon: Axel Filler, MD;  Location: [facility] OR;  Service: General;  Laterality: N/A;   LAPAROSCOPY N/A 10/16/2018   Procedure: LAPAROSCOPY DIAGNOSTIC WITH LYSIS OF ADHESIONS;  Surgeon: Axel Filler, MD;  Location: [facility];  Service: General;  Laterality: N/A;   LUNG BIOPSY     L Lower lung pulm nodules, largest 4.76mm, low risk, repeat CT in 1 yr  now following with pulm at [facility]; 9/13 Stable tiny lung nodules, no f/u needed   LYMPH NODE DISSECTION  2008   LYSIS OF ADHESION N/A 04/09/2019   Procedure: OPEN LYSIS OF ADHESION;  Surgeon: Axel Filler, MD;  Location: [facility] OR;  Service: General;  Laterality: N/A;   METATARSAL OSTEOTOMY     NECK SURGERY     x 2   OBLIQUE LUMBAR INTERBODY FUSION 1 LEVEL WITH PERCUTANEOUS SCREWS N/A 12/07/2021   Procedure: OBLIQUE LUMBAR INTERBODY FUSION 1 LEVEL WITH PERCUTANEOUS SCREWS (OLIF L4-5 WITH POSTERIOR SPINAL FUSION AND PEDICLE SCREWS);  Surgeon: Venita Lick, MD;  Location: [facility] OR;  Service: Orthopedics;  Laterality: N/A;  4 HRS DR. CLARK TO DO APPROACH LEFT TAP BLOCK WITH EXPAREL 3 C-BED   PARTIAL COLECTOMY N/A 06/05/2018   Procedure: EXPLORATORY LAPAROTOMY AND PARTIAL COLECTOMY;  Surgeon: Axel Filler, MD;  Location: [facility] OR;  Service: General;  Laterality: N/A;   POSTERIOR TIBIAL TENDON REPAIR Right 01/04/2023   Procedure: POSTERIOR TIBIAL TENDON TENOLYSIS AND  flexor  digitorum longus TRANSFER TO NAVICULAR;  Surgeon: Toni Arthurs, MD;  Location: [facility];  Service: Orthopedics;  Laterality: Right;   TONSILLECTOMY     US ECHOCARDIOGRAPHY     with Nuclear test, Dr. Anne Fu, Low risk   VESICOVAGINAL FISTULA CLOSURE W/ TAH     Social History:   reports that she has never smoked. She has never used smokeless tobacco. She reports current alcohol use. She reports that she does not use drugs.  Family History  Problem Relation Age of Onset   Hashimoto's thyroiditis Mother    High blood pressure Mother    Stroke Mother    Other Mother        Borderline DM   Alzheimer's disease Mother    Ulcerative colitis Father    Meniere's disease Father    Hearing loss Father    Stroke Father    Stroke Sister     Medications: Patient's Medications  New Prescriptions   No medications on file  Previous Medications   ACETAMINOPHEN (TYLENOL) 500 MG TABLET    Take 2 tablets (1,000 mg  total) by mouth every 6 (six) hours as needed.   ALBUTEROL (PROVENTIL HFA;VENTOLIN HFA) 108 (90 BASE) MCG/ACT INHALER    Inhale 2 puffs into the lungs every 4 (four) hours as needed for wheezing or shortness of breath.   ARIPIPRAZOLE (ABILIFY) 2 MG TABLET    TAKE 1 TABLET BY MOUTH DAILY   ASCORBIC ACID (VITAMIN C) 1000 MG TABLET    Take 1,000 mg by mouth in the morning.   ATORVASTATIN (LIPITOR) 10 MG TABLET    TAKE 1 TABLET BY MOUTH DAILY   BUPROPION (WELLBUTRIN XL) 150 MG 24 HR TABLET    Take 3 tablets (450 mg total) by mouth daily.   CYANOCOBALAMIN (VITAMIN B12) 1000 MCG/ML INJECTION    Inject 1 mL (1,000 mcg total) into the muscle every 30 (thirty) days.   DOCUSATE SODIUM (COLACE) 100 MG CAPSULE    Take 1 capsule (100 mg total) by mouth 2 (two) times daily. While taking narcotic pain medicine.   DOXYCYCLINE (PERIOSTAT) 20 MG TABLET    Take 20 mg by mouth 2 (two) times daily as needed.   DULOXETINE (CYMBALTA) 30 MG CAPSULE    Take 90 mg by mouth at bedtime.   EPINEPHRINE 0.3 MG/0.3 ML IJ SOAJ INJECTION    Inject 0.3 mg into the muscle as needed for anaphylaxis.   FEXOFENADINE (ALLEGRA) 180 MG TABLET    Take 180 mg by mouth daily.   FLUTICASONE-SALMETEROL (ADVAIR HFA) 115-21 MCG/ACT INHALER    Inhale 2 puffs into the lungs 2 (two) times daily.   FLUTICASONE-UMECLIDIN-VILANT (TRELEGY ELLIPTA) 100-62.5-25 MCG/ACT AEPB    Inhale 1 puff into the lungs daily.   HYDROXYCHLOROQUINE (PLAQUENIL) 200 MG TABLET    Take 200 mg by mouth 2 (two) times daily with a meal.   IPRATROPIUM (ATROVENT) 0.06 % NASAL SPRAY    Place 2 sprays into both nostrils in the morning and at bedtime.   KETOTIFEN (ZADITOR) 0.025 % OPHTHALMIC SOLUTION    Place 1 drop into both eyes in the morning.   LEVOTHYROXINE SODIUM 175 MCG CAPS    Take 175 mcg by mouth daily before breakfast.   METOPROLOL SUCCINATE (TOPROL-XL) 25 MG 24 HR TABLET    TAKE 1 TABLET BY MOUTH ONCE  DAILY   NITROGLYCERIN (NITROSTAT) 0.4 MG SL TABLET    Place 1 tablet  (0.4 mg total) under the tongue every 5 (five)  minutes as needed for chest pain.   OMALIZUMAB (XOLAIR) 150 MG INJECTION    300 mg every 30 (thirty) days. On the 13th of the month   PANTOPRAZOLE (PROTONIX) 40 MG TABLET    Take 80 mg by mouth 2 (two) times daily.   POLYETHYLENE GLYCOL (MIRALAX / GLYCOLAX) 17 G PACKET    Take 17 g by mouth daily as needed (constipation.).   PRAMIPEXOLE (MIRAPEX) 0.125 MG TABLET    Take 3 tablets (0.375 mg total) by mouth at bedtime as needed (restless legs syndrome).   PREGABALIN (LYRICA) 75 MG CAPSULE    Take 1 capsule (75 mg total) by mouth 2 (two) times daily.   PROBIOTIC PRODUCT (PROBIOTIC PO)    Take 1 capsule by mouth in the morning.   RIVAROXABAN (XARELTO) 10 MG TABS TABLET    Take 1 tablet (10 mg total) by mouth daily.   SENNA (SENOKOT) 8.6 MG TABS TABLET    Take 2 tablets (17.2 mg total) by mouth 2 (two) times daily.   SYRINGE, DISPOSABLE, (B-D SYRINGE LUER-LOK 3CC) 3 ML MISC      Modified Medications   No medications on file  Discontinued Medications   No medications on file    Physical Exam:  Vitals:   05/30/23 0844  BP: 128/74  Pulse: 64  Resp: 18  Temp: (!) 97.1 F (36.2 C)  SpO2: 98%  Weight: 164 lb 12.8 oz (74.8 kg)  Height: 5\' 5"  (1.651 m)   Body mass index is 27.42 kg/m. Wt Readings from Last 3 Encounters:  05/30/23 164 lb 12.8 oz (74.8 kg)  01/04/23 173 lb 8 oz (78.7 kg)  12/12/22 180 lb (81.6 kg)    Physical Exam Vitals and nursing note reviewed.  Constitutional:      Appearance: Normal appearance.  Cardiovascular:     Rate and Rhythm: Normal rate and regular rhythm.  Pulmonary:     Effort: Pulmonary effort is normal.     Breath sounds: Normal breath sounds.  Abdominal:     General: Bowel sounds are normal.     Palpations: Abdomen is soft.  Musculoskeletal:        General: Normal range of motion.  Neurological:     General: No focal deficit present.     Mental Status: She is alert and oriented to person, place, and  time.     Labs reviewed: Basic Metabolic Panel: No results for input(s): "NA", "K", "CL", "CO2", "GLUCOSE", "BUN", "CREATININE", "CALCIUM", "MG", "PHOS", "TSH" in the last 8760 hours. Liver Function Tests: No results for input(s): "AST", "ALT", "ALKPHOS", "BILITOT", "PROT", "ALBUMIN" in the last 8760 hours. No results for input(s): "LIPASE", "AMYLASE" in the last 8760 hours. No results for input(s): "AMMONIA" in the last 8760 hours. CBC: No results for input(s): "WBC", "NEUTROABS", "HGB", "HCT", "MCV", "PLT" in the last 8760 hours. Lipid Panel: No results for input(s): "CHOL", "HDL", "LDLCALC", "TRIG", "CHOLHDL", "LDLDIRECT" in the last 8760 hours. TSH: No results for input(s): "TSH" in the last 8760 hours. A1C: Lab Results  Component Value Date   HGBA1C 6.3 (H) 08/16/2022     Assessment/Plan  1. Moderate persistent asthma, uncomplicated Asymptomatic and using Trelegy as needed  2. Hypothyroidism, unspecified type Sees endocrinology last TSH was in therapeutic range  3. Fibromyalgia Doing well with chronic pains not on any pain medicine except for baclofen.  Continue with bupropion and Abilify  4. Chronic fatigue syndrome Energy levels are good does not appear depressed   Jill Lefevre, MD [place]  [facility] [phone]

## 2023-06-02 ENCOUNTER — Other Ambulatory Visit: Payer: Self-pay | Admitting: Family Medicine

## 2023-06-02 DIAGNOSIS — F339 Major depressive disorder, recurrent, unspecified: Secondary | ICD-10-CM

## 2023-06-12 ENCOUNTER — Other Ambulatory Visit: Payer: Self-pay | Admitting: Cardiology

## 2023-06-13 ENCOUNTER — Other Ambulatory Visit: Payer: Self-pay

## 2023-06-13 ENCOUNTER — Inpatient Hospital Stay (HOSPITAL_COMMUNITY)
Admission: EM | Admit: 2023-06-13 | Discharge: 2023-06-17 | DRG: 390 | Disposition: A | Discharge status: Home / Self Care | Payer: Medicare Other | Attending: Family Medicine | Admitting: Family Medicine

## 2023-06-13 ENCOUNTER — Inpatient Hospital Stay (HOSPITAL_COMMUNITY): Payer: Medicare Other

## 2023-06-13 ENCOUNTER — Encounter (HOSPITAL_COMMUNITY): Payer: Self-pay | Admitting: Internal Medicine

## 2023-06-13 ENCOUNTER — Emergency Department (HOSPITAL_COMMUNITY): Payer: Medicare Other

## 2023-06-13 DIAGNOSIS — K529 Noninfective gastroenteritis and colitis, unspecified: Secondary | ICD-10-CM | POA: Diagnosis present

## 2023-06-13 DIAGNOSIS — I4719 Other supraventricular tachycardia: Secondary | ICD-10-CM | POA: Diagnosis not present

## 2023-06-13 DIAGNOSIS — G9332 Myalgic encephalomyelitis/chronic fatigue syndrome: Secondary | ICD-10-CM | POA: Diagnosis present

## 2023-06-13 DIAGNOSIS — R140 Abdominal distension (gaseous): Secondary | ICD-10-CM | POA: Diagnosis not present

## 2023-06-13 DIAGNOSIS — I341 Nonrheumatic mitral (valve) prolapse: Secondary | ICD-10-CM | POA: Diagnosis present

## 2023-06-13 DIAGNOSIS — Z822 Family history of deafness and hearing loss: Secondary | ICD-10-CM | POA: Diagnosis not present

## 2023-06-13 DIAGNOSIS — Z881 Allergy status to other antibiotic agents status: Secondary | ICD-10-CM | POA: Diagnosis not present

## 2023-06-13 DIAGNOSIS — Z1152 Encounter for screening for COVID-19: Secondary | ICD-10-CM | POA: Diagnosis not present

## 2023-06-13 DIAGNOSIS — Z79890 Hormone replacement therapy (postmenopausal): Secondary | ICD-10-CM | POA: Diagnosis not present

## 2023-06-13 DIAGNOSIS — Z888 Allergy status to other drugs, medicaments and biological substances status: Secondary | ICD-10-CM

## 2023-06-13 DIAGNOSIS — Z91040 Latex allergy status: Secondary | ICD-10-CM

## 2023-06-13 DIAGNOSIS — Z7901 Long term (current) use of anticoagulants: Secondary | ICD-10-CM

## 2023-06-13 DIAGNOSIS — Z981 Arthrodesis status: Secondary | ICD-10-CM

## 2023-06-13 DIAGNOSIS — J45909 Unspecified asthma, uncomplicated: Secondary | ICD-10-CM | POA: Diagnosis present

## 2023-06-13 DIAGNOSIS — Z8542 Personal history of malignant neoplasm of other parts of uterus: Secondary | ICD-10-CM

## 2023-06-13 DIAGNOSIS — K56699 Other intestinal obstruction unspecified as to partial versus complete obstruction: Secondary | ICD-10-CM | POA: Diagnosis not present

## 2023-06-13 DIAGNOSIS — K6389 Other specified diseases of intestine: Secondary | ICD-10-CM | POA: Diagnosis not present

## 2023-06-13 DIAGNOSIS — Z4682 Encounter for fitting and adjustment of non-vascular catheter: Secondary | ICD-10-CM | POA: Diagnosis not present

## 2023-06-13 DIAGNOSIS — R10817 Generalized abdominal tenderness: Secondary | ICD-10-CM | POA: Diagnosis not present

## 2023-06-13 DIAGNOSIS — E063 Autoimmune thyroiditis: Secondary | ICD-10-CM | POA: Diagnosis present

## 2023-06-13 DIAGNOSIS — R9431 Abnormal electrocardiogram [ECG] [EKG]: Secondary | ICD-10-CM | POA: Diagnosis not present

## 2023-06-13 DIAGNOSIS — Z7951 Long term (current) use of inhaled steroids: Secondary | ICD-10-CM

## 2023-06-13 DIAGNOSIS — Z90710 Acquired absence of both cervix and uterus: Secondary | ICD-10-CM | POA: Diagnosis not present

## 2023-06-13 DIAGNOSIS — E781 Pure hyperglyceridemia: Secondary | ICD-10-CM | POA: Diagnosis present

## 2023-06-13 DIAGNOSIS — D72829 Elevated white blood cell count, unspecified: Secondary | ICD-10-CM | POA: Diagnosis present

## 2023-06-13 DIAGNOSIS — K56609 Unspecified intestinal obstruction, unspecified as to partial versus complete obstruction: Secondary | ICD-10-CM | POA: Diagnosis not present

## 2023-06-13 DIAGNOSIS — Z91010 Allergy to peanuts: Secondary | ICD-10-CM

## 2023-06-13 DIAGNOSIS — Z823 Family history of stroke: Secondary | ICD-10-CM

## 2023-06-13 DIAGNOSIS — L719 Rosacea, unspecified: Secondary | ICD-10-CM | POA: Diagnosis not present

## 2023-06-13 DIAGNOSIS — Z79899 Other long term (current) drug therapy: Secondary | ICD-10-CM

## 2023-06-13 DIAGNOSIS — M797 Fibromyalgia: Secondary | ICD-10-CM | POA: Diagnosis present

## 2023-06-13 DIAGNOSIS — K219 Gastro-esophageal reflux disease without esophagitis: Secondary | ICD-10-CM | POA: Diagnosis present

## 2023-06-13 DIAGNOSIS — Z9049 Acquired absence of other specified parts of digestive tract: Secondary | ICD-10-CM | POA: Diagnosis not present

## 2023-06-13 DIAGNOSIS — Z91030 Bee allergy status: Secondary | ICD-10-CM

## 2023-06-13 DIAGNOSIS — K567 Ileus, unspecified: Secondary | ICD-10-CM | POA: Diagnosis present

## 2023-06-13 DIAGNOSIS — R262 Difficulty in walking, not elsewhere classified: Secondary | ICD-10-CM | POA: Diagnosis not present

## 2023-06-13 DIAGNOSIS — Z886 Allergy status to analgesic agent status: Secondary | ICD-10-CM

## 2023-06-13 DIAGNOSIS — R1930 Abdominal rigidity, unspecified site: Secondary | ICD-10-CM | POA: Diagnosis not present

## 2023-06-13 DIAGNOSIS — R109 Unspecified abdominal pain: Secondary | ICD-10-CM | POA: Diagnosis not present

## 2023-06-13 DIAGNOSIS — G4733 Obstructive sleep apnea (adult) (pediatric): Secondary | ICD-10-CM | POA: Diagnosis present

## 2023-06-13 DIAGNOSIS — Z91048 Other nonmedicinal substance allergy status: Secondary | ICD-10-CM

## 2023-06-13 DIAGNOSIS — G2581 Restless legs syndrome: Secondary | ICD-10-CM | POA: Diagnosis present

## 2023-06-13 DIAGNOSIS — Z820 Family history of epilepsy and other diseases of the nervous system: Secondary | ICD-10-CM

## 2023-06-13 DIAGNOSIS — K5669 Other partial intestinal obstruction: Secondary | ICD-10-CM | POA: Diagnosis not present

## 2023-06-13 DIAGNOSIS — E039 Hypothyroidism, unspecified: Secondary | ICD-10-CM | POA: Diagnosis not present

## 2023-06-13 DIAGNOSIS — Z91013 Allergy to seafood: Secondary | ICD-10-CM

## 2023-06-13 DIAGNOSIS — I10 Essential (primary) hypertension: Secondary | ICD-10-CM | POA: Diagnosis not present

## 2023-06-13 DIAGNOSIS — R188 Other ascites: Secondary | ICD-10-CM | POA: Diagnosis not present

## 2023-06-13 LAB — CBC
HCT: 42.8 % (ref 36.0–46.0)
Hemoglobin: 14 g/dL (ref 12.0–15.0)
MCH: 30.8 pg (ref 26.0–34.0)
MCHC: 32.7 g/dL (ref 30.0–36.0)
MCV: 94.1 fL (ref 80.0–100.0)
Platelets: 383 10*3/uL (ref 150–400)
RBC: 4.55 MIL/uL (ref 3.87–5.11)
RDW: 11.9 % (ref 11.5–15.5)
WBC: 14.4 10*3/uL — ABNORMAL HIGH (ref 4.0–10.5)
nRBC: 0 % (ref 0.0–0.2)

## 2023-06-13 LAB — CBC WITH DIFFERENTIAL/PLATELET
Abs Immature Granulocytes: 0.06 10*3/uL (ref 0.00–0.07)
Basophils Absolute: 0.1 10*3/uL (ref 0.0–0.1)
Basophils Relative: 1 %
Eosinophils Absolute: 0.3 10*3/uL (ref 0.0–0.5)
Eosinophils Relative: 2 %
HCT: 44.5 % (ref 36.0–46.0)
Hemoglobin: 14.5 g/dL (ref 12.0–15.0)
Immature Granulocytes: 0 %
Lymphocytes Relative: 9 %
Lymphs Abs: 1.3 10*3/uL (ref 0.7–4.0)
MCH: 30.7 pg (ref 26.0–34.0)
MCHC: 32.6 g/dL (ref 30.0–36.0)
MCV: 94.1 fL (ref 80.0–100.0)
Monocytes Absolute: 0.7 10*3/uL (ref 0.1–1.0)
Monocytes Relative: 5 %
Neutro Abs: 11.4 10*3/uL — ABNORMAL HIGH (ref 1.7–7.7)
Neutrophils Relative %: 83 %
Platelets: 402 10*3/uL — ABNORMAL HIGH (ref 150–400)
RBC: 4.73 MIL/uL (ref 3.87–5.11)
RDW: 12.2 % (ref 11.5–15.5)
WBC: 13.9 10*3/uL — ABNORMAL HIGH (ref 4.0–10.5)
nRBC: 0 % (ref 0.0–0.2)

## 2023-06-13 LAB — I-STAT CHEM 8, ED
BUN: 17 mg/dL (ref 8–23)
Calcium, Ion: 1.2 mmol/L (ref 1.15–1.40)
Chloride: 101 mmol/L (ref 98–111)
Creatinine, Ser: 1 mg/dL (ref 0.44–1.00)
Glucose, Bld: 122 mg/dL — ABNORMAL HIGH (ref 70–99)
HCT: 45 % (ref 36.0–46.0)
Hemoglobin: 15.3 g/dL — ABNORMAL HIGH (ref 12.0–15.0)
Potassium: 3.9 mmol/L (ref 3.5–5.1)
Sodium: 142 mmol/L (ref 135–145)
TCO2: 26 mmol/L (ref 22–32)

## 2023-06-13 LAB — HEMOGLOBIN A1C
Hgb A1c MFr Bld: 5 % (ref 4.8–5.6)
Mean Plasma Glucose: 96.8 mg/dL

## 2023-06-13 LAB — COMPREHENSIVE METABOLIC PANEL
ALT: 16 U/L (ref 0–44)
ALT: 14 U/L (ref 0–44)
AST: 22 U/L (ref 15–41)
AST: 19 U/L (ref 15–41)
Albumin: 4 g/dL (ref 3.5–5.0)
Albumin: 3.8 g/dL (ref 3.5–5.0)
Alkaline Phosphatase: 98 U/L (ref 38–126)
Alkaline Phosphatase: 86 U/L (ref 38–126)
Anion gap: 13 (ref 5–15)
Anion gap: 8 (ref 5–15)
BUN: 15 mg/dL (ref 8–23)
BUN: 12 mg/dL (ref 8–23)
CO2: 27 mmol/L (ref 22–32)
CO2: 26 mmol/L (ref 22–32)
Calcium: 9.7 mg/dL (ref 8.9–10.3)
Calcium: 9.3 mg/dL (ref 8.9–10.3)
Chloride: 101 mmol/L (ref 98–111)
Chloride: 105 mmol/L (ref 98–111)
Creatinine, Ser: 1.07 mg/dL — ABNORMAL HIGH (ref 0.44–1.00)
Creatinine, Ser: 0.9 mg/dL (ref 0.44–1.00)
GFR, Estimated: 57 mL/min — ABNORMAL LOW (ref >60)
GFR, Estimated: >60 mL/min (ref >60)
Glucose, Bld: 125 mg/dL — ABNORMAL HIGH (ref 70–99)
Glucose, Bld: 91 mg/dL (ref 70–99)
Potassium: 3.8 mmol/L (ref 3.5–5.1)
Potassium: 4.3 mmol/L (ref 3.5–5.1)
Sodium: 141 mmol/L (ref 135–145)
Sodium: 139 mmol/L (ref 135–145)
Total Bilirubin: 0.7 mg/dL (ref 0.3–1.2)
Total Bilirubin: 0.8 mg/dL (ref 0.3–1.2)
Total Protein: 7 g/dL (ref 6.5–8.1)
Total Protein: 6.7 g/dL (ref 6.5–8.1)

## 2023-06-13 LAB — PHOSPHORUS: Phosphorus: 3.8 mg/dL (ref 2.5–4.6)

## 2023-06-13 LAB — URINALYSIS, ROUTINE W REFLEX MICROSCOPIC
Bilirubin Urine: NEGATIVE
Glucose, UA: NEGATIVE mg/dL
Hgb urine dipstick: NEGATIVE
Ketones, ur: NEGATIVE mg/dL
Leukocytes,Ua: NEGATIVE
Nitrite: NEGATIVE
Protein, ur: NEGATIVE mg/dL
Specific Gravity, Urine: 1.02 (ref 1.005–1.030)
pH: 7 (ref 5.0–8.0)

## 2023-06-13 LAB — MAGNESIUM
Magnesium: 2.2 mg/dL (ref 1.7–2.4)
Magnesium: 2.2 mg/dL (ref 1.7–2.4)

## 2023-06-13 LAB — HIV ANTIBODY (ROUTINE TESTING W REFLEX): HIV Screen 4th Generation wRfx: NONREACTIVE

## 2023-06-13 LAB — LIPASE, BLOOD: Lipase: 33 U/L (ref 11–51)

## 2023-06-13 MED ORDER — HYDROMORPHONE HCL 1 MG/ML IJ SOLN
0.5000 mg | INTRAMUSCULAR | Status: DC | PRN
  Administered 2023-06-13 – 2023-06-15 (×8): 1 mg via INTRAVENOUS
  Filled 2023-06-13 (×8): qty 1

## 2023-06-13 MED ORDER — ONDANSETRON HCL 4 MG/2ML IJ SOLN
4.0000 mg | Freq: Once | INTRAMUSCULAR | Status: AC
  Administered 2023-06-13: 4 mg via INTRAVENOUS
  Filled 2023-06-13: qty 2

## 2023-06-13 MED ORDER — ONDANSETRON HCL 4 MG PO TABS: 4.0000 mg | ORAL_TABLET | Freq: Four times a day (QID) | ORAL | Status: DC | PRN

## 2023-06-13 MED ORDER — HEPARIN SODIUM (PORCINE) 5000 UNIT/ML IJ SOLN
5000.0000 [IU] | Freq: Three times a day (TID) | INTRAMUSCULAR | Status: DC
  Administered 2023-06-13 – 2023-06-17 (×11): 5000 [IU] via SUBCUTANEOUS
  Filled 2023-06-13 (×11): qty 1

## 2023-06-13 MED ORDER — HYDROMORPHONE HCL 1 MG/ML IJ SOLN
0.5000 mg | Freq: Once | INTRAMUSCULAR | Status: AC
  Administered 2023-06-13: 0.5 mg via INTRAVENOUS
  Filled 2023-06-13: qty 1

## 2023-06-13 MED ORDER — DIATRIZOATE MEGLUMINE & SODIUM 66-10 % PO SOLN
90.0000 mL | Freq: Once | ORAL | Status: AC
  Administered 2023-06-13: 90 mL via NASOGASTRIC
  Filled 2023-06-13: qty 90

## 2023-06-13 MED ORDER — LACTATED RINGERS IV BOLUS
500.0000 mL | Freq: Once | INTRAVENOUS | Status: AC
  Administered 2023-06-13: 500 mL via INTRAVENOUS

## 2023-06-13 MED ORDER — FENTANYL CITRATE PF 50 MCG/ML IJ SOSY: 12.5000 ug | PREFILLED_SYRINGE | INTRAMUSCULAR | Status: DC | PRN

## 2023-06-13 MED ORDER — SODIUM CHLORIDE 0.9 % IV SOLN: INTRAVENOUS | Status: DC

## 2023-06-13 MED ORDER — IOHEXOL 350 MG/ML SOLN
75.0000 mL | Freq: Once | INTRAVENOUS | Status: AC | PRN
  Administered 2023-06-13: 75 mL via INTRAVENOUS

## 2023-06-13 MED ORDER — PNEUMOCOCCAL 20-VAL CONJ VACC 0.5 ML IM SUSY: 0.5000 mL | PREFILLED_SYRINGE | INTRAMUSCULAR | Status: DC | PRN

## 2023-06-13 MED ORDER — ONDANSETRON HCL 4 MG/2ML IJ SOLN
4.0000 mg | Freq: Four times a day (QID) | INTRAMUSCULAR | Status: DC | PRN
  Administered 2023-06-13 – 2023-06-15 (×3): 4 mg via INTRAVENOUS
  Filled 2023-06-13 (×2): qty 2

## 2023-06-13 MED ORDER — ONDANSETRON HCL 4 MG/2ML IJ SOLN
4.0000 mg | Freq: Four times a day (QID) | INTRAMUSCULAR | Status: DC | PRN
  Administered 2023-06-13: 4 mg via INTRAVENOUS
  Filled 2023-06-13 (×2): qty 2

## 2023-06-13 MED ORDER — PANTOPRAZOLE SODIUM 40 MG IV SOLR
40.0000 mg | INTRAVENOUS | Status: DC
  Administered 2023-06-13 – 2023-06-16 (×4): 40 mg via INTRAVENOUS
  Filled 2023-06-13 (×4): qty 10

## 2023-06-13 MED ORDER — ALBUTEROL SULFATE (2.5 MG/3ML) 0.083% IN NEBU: 2.5000 mg | INHALATION_SOLUTION | RESPIRATORY_TRACT | Status: DC | PRN

## 2023-06-13 MED ORDER — ACETAMINOPHEN 10 MG/ML IV SOLN: 1000.0000 mg | Freq: Four times a day (QID) | INTRAVENOUS | Status: DC | PRN

## 2023-06-13 MED ORDER — ACETAMINOPHEN 10 MG/ML IV SOLN: 1000.0000 mg | Freq: Four times a day (QID) | INTRAVENOUS | Status: AC | PRN

## 2023-06-13 NOTE — Consult Note (Signed)
[facility] Surgery Consult Note  [name] 1956-08-12  [id number].    Requesting MD: Skip Mayer Chief Complaint/Reason for Consult: SBO  HPI:  [name] is a 67 y.o. female with prior h/o multiple abdominal surgeries including hysterectomy 2008, cholecystectomy 2009, Hartmann's 2019, colostomy reversal 2019, incisional hernia repair, laparoscopic lysis of adhesions 2019, open lysis of adhesions 2020. She presented to the ED today complaining of worsening abdominal pain. States that it has been going on for about 1 week, and then last night her symptoms became more severe. She describes generalized abdominal pain and bloating. Associated with nausea and acid reflux, no emesis. Last bowel movement was about 4 days ago, and she generally has a bowel movement every other day. She has passed a small amount of flatus today.  ED work up included CT scan which was concerning for possible small bowel obstruction. Patient was admitted to the medical service and general surgery asked to see in consult.   Family History  Problem Relation Age of Onset   Hashimoto's thyroiditis Mother    High blood pressure Mother    Stroke Mother    Other Mother        Borderline DM   Alzheimer's disease Mother    Ulcerative colitis Father    Meniere's disease Father    Hearing loss Father    Stroke Father    Stroke Sister     Past Medical History:  Diagnosis Date   Asthma    Back pain    Chronic fatigue syndrome    Dr. Hyacinth Meeker   Complication of anesthesia    Cough    /SOB, PFT's nl, improved with Bronchodilation 8/11   Dysrhythmia    Endometrial cancer (HCC)    Esophageal spasm    GERD Dr. Hyacinth Meeker, Dr. Laural Benes; egd 2006 nl; UGI 3/13 Small HH, moderate GERD, nl motility; seen at [facility] ([place] egd? neg, sone improvement on sucralfate susp   Fibromyalgia    Foot pain    Dr. Netta Corrigan   Hashimoto's thyroiditis    High triglycerides    Hypothyroidism    Dr. Wynne Dust 4/14    Leukocytoclastic vasculitis (HCC)    Dr. Danella Deis   Low HDL (under 40)    Memory loss    Mitral valve prolapse    Paroxysmal atrial tachycardia    Dr. Anne Fu   Patent foramen ovale    Pernicious anemia    Dr. Hyacinth Meeker   PONV (postoperative nausea and vomiting)    along time ago had nausea ans vomiting after surgery-25 years ago   Reflux    ? delayed gastric emptying--GES nl 01/2012 (6% at 2hrs)   RLS (restless legs syndrome)    low ferritin Dr. Hyacinth Meeker   Rosacea    Sleep apnea    Uterine carcinoma (HCC)    Dr. Clifton James    Past Surgical History:  Procedure Laterality Date   ABDOMINAL EXPOSURE N/A 12/07/2021   Procedure: ABDOMINAL EXPOSURE;  Surgeon: Cephus Shelling, MD;  Location: [facility] OR;  Service: Vascular;  Laterality: N/A;   ABDOMINAL HYSTERECTOMY     bladder tack     x 2   BUNIONECTOMY     CALCANEAL OSTEOTOMY Right 01/04/2023   Procedure: CALCANEAL OSTEOTOMY;  Surgeon: Toni Arthurs, MD;  Location:  SURGERY CENTER;  Service: Orthopedics;  Laterality: Right;   CHOLECYSTECTOMY  2009   COLONOSCOPY     scr, 12/2008 ([facility]) nl   COLOSTOMY Left 06/05/2018   Procedure: COLOSTOMY;  Surgeon:  Axel Filler, MD;  Location: [facility] OR;  Service: General;  Laterality: Left;   COLOSTOMY REVERSAL N/A 10/16/2018   Procedure: HARTMANN'S COLOSTOMY REVERSAL, RIGID PROCTOSCOPY;  Surgeon: Axel Filler, MD;  Location: [facility] ORS;  Service: General;  Laterality: N/A;   GASTROCNEMIUS RECESSION Right 01/04/2023   Procedure: GASTROCNEMIUS recession;  Surgeon: Toni Arthurs, MD;  Location: [facility];  Service: Orthopedics;  Laterality: Right;   HAMMER TOE SURGERY     HYSTERECTOMY ABDOMINAL WITH SALPINGO-OOPHORECTOMY  2008   INCISIONAL HERNIA REPAIR  04/09/2019   x2   INCISIONAL HERNIA REPAIR N/A 04/09/2019   Procedure: INCISIONAL HERNIA REPAIR WITH MESH;  Surgeon: Axel Filler, MD;  Location: [facility] OR;  Service: General;  Laterality: N/A;   LAPAROSCOPY N/A 10/16/2018   Procedure:  LAPAROSCOPY DIAGNOSTIC WITH LYSIS OF ADHESIONS;  Surgeon: Axel Filler, MD;  Location: [facility];  Service: General;  Laterality: N/A;   LUNG BIOPSY     L Lower lung pulm nodules, largest 4.3mm, low risk, repeat CT in 1 yr now following with pulm at [facility]; 9/13 Stable tiny lung nodules, no f/u needed   LYMPH NODE DISSECTION  2008   LYSIS OF ADHESION N/A 04/09/2019   Procedure: OPEN LYSIS OF ADHESION;  Surgeon: Axel Filler, MD;  Location: [facility] OR;  Service: General;  Laterality: N/A;   METATARSAL OSTEOTOMY     NECK SURGERY     x 2   OBLIQUE LUMBAR INTERBODY FUSION 1 LEVEL WITH PERCUTANEOUS SCREWS N/A 12/07/2021   Procedure: OBLIQUE LUMBAR INTERBODY FUSION 1 LEVEL WITH PERCUTANEOUS SCREWS (OLIF L4-5 WITH POSTERIOR SPINAL FUSION AND PEDICLE SCREWS);  Surgeon: Venita Lick, MD;  Location: [facility] OR;  Service: Orthopedics;  Laterality: N/A;  4 HRS DR. CLARK TO DO APPROACH LEFT TAP BLOCK WITH EXPAREL 3 C-BED   PARTIAL COLECTOMY N/A 06/05/2018   Procedure: EXPLORATORY LAPAROTOMY AND PARTIAL COLECTOMY;  Surgeon: Axel Filler, MD;  Location: [facility] OR;  Service: General;  Laterality: N/A;   POSTERIOR TIBIAL TENDON REPAIR Right 01/04/2023   Procedure: POSTERIOR TIBIAL TENDON TENOLYSIS AND  flexor digitorum longus TRANSFER TO NAVICULAR;  Surgeon: Toni Arthurs, MD;  Location: [facility];  Service: Orthopedics;  Laterality: Right;   TONSILLECTOMY     US ECHOCARDIOGRAPHY     with Nuclear test, Dr. Anne Fu, Low risk   VESICOVAGINAL FISTULA CLOSURE W/ TAH      Social History:  reports that she has never smoked. She has never used smokeless tobacco. She reports current alcohol use. She reports that she does not use drugs.  Allergies:  Allergies  Allergen Reactions   Aspirin Anaphylaxis    In higher doses   Bee Venom Shortness Of Breath, Itching, Swelling and Hives   Peanut-Containing Drug Products Anaphylaxis and Other (See Comments)   Ambien [Zolpidem Tartrate] Other (See Comments)     Hallucinations   Tape Itching, Swelling and Rash   Ciprofloxacin Nausea And Vomiting   Latex Rash   Minocycline Rash   Shellfish Allergy Rash    (Not in a hospital admission)   Prior to Admission medications   Medication Sig Start Date End Date Taking? Authorizing Provider  acetaminophen (TYLENOL) 500 MG tablet Take 2 tablets (1,000 mg total) by mouth every 6 (six) hours as needed. 06/14/18   Barnetta Chapel, PA-C  albuterol (PROVENTIL HFA;VENTOLIN HFA) 108 (90 Base) MCG/ACT inhaler Inhale 2 puffs into the lungs every 4 (four) hours as needed for wheezing or shortness of breath. 06/13/16   [provider]  ARIPiprazole (  ABILIFY) 2 MG tablet TAKE 1 TABLET BY MOUTH DAILY 06/04/23   [name], MD  Ascorbic Acid (VITAMIN C) 1000 MG tablet Take 1,000 mg by mouth in the morning.    [provider]  atorvastatin (LIPITOR) 10 MG tablet TAKE 1 TABLET BY MOUTH DAILY 04/11/23   Jake Bathe, MD  buPROPion (WELLBUTRIN XL) 150 MG 24 hr tablet Take 3 tablets (450 mg total) by mouth daily. 05/01/23   [name], MD  cyanocobalamin (VITAMIN B12) 1000 MCG/ML injection Inject 1 mL (1,000 mcg total) into the muscle every 30 (thirty) days. 11/29/22   [name], MD  docusate sodium (COLACE) 100 MG capsule Take 1 capsule (100 mg total) by mouth 2 (two) times daily. While taking narcotic pain medicine. 01/04/23   Jacinta Shoe, PA-C  doxycycline (PERIOSTAT) 20 MG tablet Take 20 mg by mouth 2 (two) times daily as needed.    [provider]  DULoxetine (CYMBALTA) 30 MG capsule Take 90 mg by mouth at bedtime.    [provider]  EPINEPHrine 0.3 mg/0.3 mL IJ SOAJ injection Inject 0.3 mg into the muscle as needed for anaphylaxis.    [provider]  fexofenadine (ALLEGRA) 180 MG tablet Take 180 mg by mouth daily.    [provider]  fluticasone-salmeterol (ADVAIR HFA) 115-21 MCG/ACT inhaler Inhale 2 puffs into the lungs 2 (two) times daily.     [provider]  Fluticasone-Umeclidin-Vilant (TRELEGY ELLIPTA) 100-62.5-25 MCG/ACT AEPB Inhale 1 puff into the lungs daily.    [provider]  hydroxychloroquine (PLAQUENIL) 200 MG tablet Take 200 mg by mouth 2 (two) times daily with a meal.    [provider]  ipratropium (ATROVENT) 0.06 % nasal spray Place 2 sprays into both nostrils in the morning and at bedtime. 01/02/20   [provider]  ketotifen (ZADITOR) 0.025 % ophthalmic solution Place 1 drop into both eyes in the morning.    [provider]  levothyroxine (SYNTHROID) 175 MCG tablet Take 175 mcg by mouth daily before breakfast. 06/10/23   [provider]  Levothyroxine Sodium 175 MCG CAPS Take 175 mcg by mouth daily before breakfast.    [provider]  metoprolol succinate (TOPROL-XL) 25 MG 24 hr tablet TAKE 1 TABLET BY MOUTH ONCE  DAILY 04/11/23   Jake Bathe, MD  nitroGLYCERIN (NITROSTAT) 0.4 MG SL tablet Place 1 tablet (0.4 mg total) under the tongue every 5 (five) minutes as needed for chest pain. 02/09/22   Orbie Pyo, MD  omalizumab Geoffry Paradise) 150 MG injection 300 mg every 30 (thirty) days. On the 13th of the month    [provider]  pantoprazole (PROTONIX) 40 MG tablet Take 80 mg by mouth 2 (two) times daily. 08/09/20   [provider]  polyethylene glycol (MIRALAX / GLYCOLAX) 17 g packet Take 17 g by mouth daily as needed (constipation.).    [provider]  pramipexole (MIRAPEX) 0.125 MG tablet Take 3 tablets (0.375 mg total) by mouth at bedtime as needed (restless legs syndrome). 02/27/22   [name], MD  pregabalin (LYRICA) 75 MG capsule Take 1 capsule (75 mg total) by mouth 2 (two) times daily. 08/16/22   [name], MD  Probiotic Product (PROBIOTIC PO) Take 1 capsule by mouth in the morning.    [provider]  rivaroxaban (XARELTO) 10 MG TABS tablet Take 1 tablet (10 mg total) by mouth daily. 01/04/23   Jacinta Shoe,  PA-C  senna (SENOKOT) 8.6 MG TABS tablet Take 2 tablets (17.2 mg total) by mouth 2 (two) times daily. 01/04/23   Jacinta Shoe, PA-C  Syringe, Disposable, (B-D SYRINGE LUER-LOK 3CC) 3 ML MISC     [provider]  Dwyane Luo 200-62.5-25 MCG/ACT AEPB Inhale 1 puff into the lungs daily. 05/22/23   [provider]    Blood pressure 113/63, pulse 64, temperature 98.1 F (36.7 C), temperature source Oral, resp. rate 16, height 5\' 5"  (1.651 m), weight 72.6 kg, SpO2 92%. Physical Exam: General: pleasant, WD/WN female who is laying in bed in NAD HEENT: head is normocephalic, atraumatic.  Sclera are noninjected.  Pupils equal and round.  Ears and nose without any masses or lesions.  Mouth is pink and moist. Dentition fair Heart: regular, rate, and rhythm Lungs: CTAB, no wheezes, rhonchi, or rales noted.  Respiratory effort nonlabored Abd: well healed laparotomy and prior LLQ colostomy incisions as well and laparoscopic incisions and Pfannenstiel incision. Distended and somewhat firm. Mild diffuse tenderness worse in the left hemiabdomen than the right. No rigidity or guarding, no peritonitis MS: no BUE/BLE edema, calves soft and nontender Skin: warm and dry with no masses, lesions, or rashes Psych: A&Ox4 with an appropriate affect Neuro: MAEs, no gross motor or sensory deficits BUE/BLE  Results for orders placed or performed during the hospital encounter of 06/13/23 (from the past 48 hour(s))  Comprehensive metabolic panel     Status: Abnormal   Collection Time: 06/13/23 10:05 AM  Result Value Ref Range   Sodium 141 135 - 145 mmol/L   Potassium 3.8 3.5 - 5.1 mmol/L   Chloride 101 98 - 111 mmol/L   CO2 27 22 - 32 mmol/L   Glucose, Bld 125 (H) 70 - 99 mg/dL    Comment: Glucose reference range applies only to samples taken after fasting for at least 8 hours.   BUN 15 8 - 23 mg/dL   Creatinine, Ser 1.61 (H) 0.44 - 1.00 mg/dL   Calcium 9.7 8.9 - 09.6 mg/dL    Total Protein 7.0 6.5 - 8.1 g/dL   Albumin 4.0 3.5 - 5.0 g/dL   AST 22 15 - 41 U/L   ALT 16 0 - 44 U/L   Alkaline Phosphatase 98 38 - 126 U/L   Total Bilirubin 0.7 0.3 - 1.2 mg/dL   GFR, Estimated 57 (L) >60 mL/min    Comment: (NOTE) Calculated using the CKD-EPI Creatinine Equation (2021)    Anion gap 13 5 - 15    Comment: Performed at [facility], [address], [place], [place] [address]  Lipase, blood     Status: None   Collection Time: 06/13/23 10:05 AM  Result Value Ref Range   Lipase 33 11 - 51 U/L    Comment: Performed at [facility], [address] [place], [place] [address]  CBC with Diff     Status: Abnormal   Collection Time: 06/13/23 10:05 AM  Result Value Ref Range   WBC 13.9 (H) 4.0 - 10.5 K/uL   RBC 4.73 3.87 - 5.11 MIL/uL   Hemoglobin 14.5 12.0 - 15.0 g/dL   HCT 14.7 82.9 - 56.2 %   MCV 94.1 80.0 - 100.0 fL   MCH 30.7 26.0 - 34.0 pg   MCHC 32.6 30.0 - 36.0 g/dL   RDW 13.0 86.5 - 78.4 %   Platelets 402 (H) 150 - 400 K/uL   nRBC 0.0 0.0 - 0.2 %  Neutrophils Relative % 83 %   Neutro Abs 11.4 (H) 1.7 - 7.7 K/uL   Lymphocytes Relative 9 %   Lymphs Abs 1.3 0.7 - 4.0 K/uL   Monocytes Relative 5 %   Monocytes Absolute 0.7 0.1 - 1.0 K/uL   Eosinophils Relative 2 %   Eosinophils Absolute 0.3 0.0 - 0.5 K/uL   Basophils Relative 1 %   Basophils Absolute 0.1 0.0 - 0.1 K/uL   Immature Granulocytes 0 %   Abs Immature Granulocytes 0.06 0.00 - 0.07 K/uL    Comment: Performed at [facility], [address], [place], [place] [address]  Urinalysis, Routine w reflex microscopic -Urine, Clean Catch     Status: None   Collection Time: 06/13/23 10:05 AM  Result Value Ref Range   Color, Urine YELLOW YELLOW   APPearance CLEAR CLEAR   Specific Gravity, Urine 1.020 1.005 - 1.030   pH 7.0 5.0 - 8.0   Glucose, UA NEGATIVE NEGATIVE mg/dL   Hgb urine dipstick NEGATIVE NEGATIVE   Bilirubin Urine NEGATIVE NEGATIVE   Ketones, ur NEGATIVE NEGATIVE mg/dL    Protein, ur NEGATIVE NEGATIVE mg/dL   Nitrite NEGATIVE NEGATIVE   Leukocytes,Ua NEGATIVE NEGATIVE    Comment: Microscopic not done on urines with negative protein, blood, leukocytes, nitrite, or glucose < 500 mg/dL. Performed at [facility], [address], [place], [place] [address]   Magnesium     Status: None   Collection Time: 06/13/23 10:05 AM  Result Value Ref Range   Magnesium 2.2 1.7 - 2.4 mg/dL    Comment: Performed at [facility], [address], [place], [place] [address]  I-stat chem 8, ED (not at [facility], [facility] or [facility])     Status: Abnormal   Collection Time: 06/13/23 10:13 AM  Result Value Ref Range   Sodium 142 135 - 145 mmol/L   Potassium 3.9 3.5 - 5.1 mmol/L   Chloride 101 98 - 111 mmol/L   BUN 17 8 - 23 mg/dL   Creatinine, Ser 9.14 0.44 - 1.00 mg/dL   Glucose, Bld 782 (H) 70 - 99 mg/dL    Comment: Glucose reference range applies only to samples taken after fasting for at least 8 hours.   Calcium, Ion 1.20 1.15 - 1.40 mmol/L   TCO2 26 22 - 32 mmol/L   Hemoglobin 15.3 (H) 12.0 - 15.0 g/dL   HCT 95.6 21.3 - 08.6 %   CT ABDOMEN PELVIS W CONTRAST  Result Date: 06/13/2023 CLINICAL DATA:  Abdominal pain nonlocalized EXAM: CT ABDOMEN AND PELVIS WITH CONTRAST TECHNIQUE: Multidetector CT imaging of the abdomen and pelvis was performed using the standard protocol following bolus administration of intravenous contrast. RADIATION DOSE REDUCTION: This exam was performed according to the departmental dose-optimization program which includes automated exposure control, adjustment of the mA and/or kV according to patient size and/or use of iterative reconstruction technique. CONTRAST:  75mL OMNIPAQUE IOHEXOL 350 MG/ML SOLN COMPARISON:  CT without contrast 312 20 FINDINGS: Lower chest: Mild linear opacity lung bases likely scar or atelectasis no pleural effusion. Small air cysts along the left lower lobe on series 5 image 10. There is some bronchiectasis along the lung bases. No  pleural effusion 3 mm nodule left lower lobe series 5 image 4 and just above the diaphragm on the left on series 5, image 23. These are unchanged from prior examination demonstrating long-term stability. No additional follow-up. Hepatobiliary: No focal liver abnormality is seen. Status post cholecystectomy. No biliary  dilatation. Patent portal vein. Pancreas: Unremarkable. No pancreatic ductal dilatation or surrounding inflammatory changes. Spleen: Normal in size without focal abnormality. Small anterior splenule. Adrenals/Urinary Tract: The adrenal glands are preserved. No enhancing renal mass or collecting system dilatation. The ureters have normal course and caliber extending down to the bladder. Preserved contours of the urinary bladder. Stomach/Bowel: Stomach is distended with fluid. There is also fluid along the duodenal and jejunum with some mildly dilated loops of jejunum. Slow transition to nondistended loops in the central anterior abdomen and pelvis. The ileum is relatively decompressed. There is some fluid and stranding along the mesentery adjacent to these loops there is large amount of stool in the right side of the colon and cecum with the cecum in the central pelvis above the bladder just right of midline. Otherwise the colon more distally has a normal course and caliber. Surgical changes along the rectosigmoid region. The appendix is poorly seen. No pericecal stranding or fluid. Vascular/Lymphatic: Aortic atherosclerosis. No enlarged abdominal or pelvic lymph nodes. Reproductive: Status post hysterectomy. No adnexal masses. Other: No free intra-air. Musculoskeletal: Degenerative changes of the spine and pelvis. Fixation hardware at L4-5. Associated streak artifact. IMPRESSION: Fluid-filled stomach and proximal small bowel, duodenal and jejunum with some dilatation of the jejunum up to 3.2 cm. There is a somewhat slow transition to decompressed loops in the anterior upper pelvis in the lower abdomen  with some surrounding mesenteric stranding and fluid. No pneumatosis. This could be a mild, low-grade obstruction that is developing versus ileus. Please correlate with clinical symptoms and recommend follow-up. No free air. Surgical changes along the rectosigmoid colon. Electronically Signed   By: Karen Kays M.D.   On: 06/13/2023 12:00      Assessment/Plan SBO vs gastroenteritis - CT scan with a fluid-filled stomach and proximal small bowel, duodenal and jejunum with some dilatation of the jejunum up to 3.2 cm; there is a somewhat slow transition to decompressed loops in the anterior upper pelvis in the lower abdomen with some surrounding mesenteric stranding and fluid; this could be a mild, low-grade obstruction that is developing versus ileus; no pneumatosis - WBC 13.9, VSS, tender but no peritonitis on exam. No indication for acute surgical intervention.  - Recommend NG tube for decompression, then will start SBO protocol with gastrograffin. We will follow.  ID - none VTE - SCDs, ok for chemical dvt ppx from surgical standpoint FEN - IVF, NPO/NGT to LIWS Foley - none  Chronic fatigue syndrome Fibromyalgia Asthma Hypothyroidism HLD  I reviewed ED provider notes, last 24 h vitals and pain scores, last 48 h intake and output, last 24 h labs and trends, and last 24 h imaging results.   Franne Forts, PA-C [facility] Surgery 06/13/2023, 1:53 PM Please see Amion for pager number during day hours 7:00am-4:30pm

## 2023-06-13 NOTE — ED Notes (Signed)
Pt ambulatory to and from restroom with steady gait. Pt placed on bedside cardiac monitor, NAD noted. Pt denies any current needs, call bell within reach. Ongoing care.

## 2023-06-13 NOTE — H&P (Signed)
History and Physical    [name] UJW:[id number] DOB: 1955-12-15 DOA: 06/13/2023  PCP: [name], MD  Patient coming from: home  I have personally briefly reviewed patient's old medical records in [facility]  Chief Complaint: abdominal pain nausea progressive x 1 week  HPI: [name] is a 67 y.o. female with medical history significant of  SVT, Asthma,hypothyroidism,GERD,OSA , history of multiple abdominal surgeries including hysterectomy, cholesytectomy, colon resection due to ischemic bowel related to adhesions with colostomy with reversal. Patient presents to ED with abdominal pain x 1 week with intermittent nausea. Patient notes last evening symptoms became severe. She notes she has increase pain more so in left lower quadrant and persistent nausea but no emesis. She notes her last bm was 4 days prior. She notes notes no fever but felt chilled.  She notes no difficulty with urine, no sob/or chest pain.   ED Course:  In ED  Vitals: afeb, bp 130/90, hr 67, rr18, sat 98% EKG:nsr Wbcv: 13.9, hgb14.5, plt 402 NA 141, k3.8, glu 125, cr1.07 Mag 2.2 Lipase33 UA;neg Tx zofran, dilaudid CTAB/pelvis IMPRESSION: Fluid-filled stomach and proximal small bowel, duodenal and jejunum with some dilatation of the jejunum up to 3.2 cm. There is a somewhat slow transition to decompressed loops in the anterior upper pelvis in the lower abdomen with some surrounding mesenteric stranding and fluid. No pneumatosis. This could be a mild, low-grade obstruction that is developing versus ileus. Please correlate with clinical symptoms and recommend follow-up.   No free air.   Surgical changes along the rectosigmoid colon. Review of Systems: As per HPI otherwise 10 point review of systems negative.   Past Medical History:  Diagnosis Date   Asthma    Back pain    Chronic fatigue syndrome    Dr. Hyacinth Meeker   Complication of anesthesia    Cough    /SOB, PFT's nl, improved with  Bronchodilation 8/11   Dysrhythmia    SVT   Endometrial cancer (HCC)    Esophageal spasm    GERD Dr. Hyacinth Meeker, Dr. Laural Benes; egd 2006 nl; UGI 3/13 Small HH, moderate GERD, nl motility; seen at [facility] [place] egd? neg, sone improvement on sucralfate susp   Fibromyalgia    Foot pain    Dr. Netta Corrigan   Hashimoto's thyroiditis    High triglycerides    Hypothyroidism    Dr. Wynne Dust 4/14   Leukocytoclastic vasculitis (HCC)    Dr. Danella Deis   Low HDL (under 40)    Mitral valve prolapse    Paroxysmal atrial tachycardia    Dr. Anne Fu   Patent foramen ovale    Pernicious anemia    Dr. Hyacinth Meeker   PONV (postoperative nausea and vomiting)    along time ago had nausea ans vomiting after surgery-25 years ago   Reflux    ? delayed gastric emptying--GES nl 01/2012 (6% at 2hrs)   RLS (restless legs syndrome)    low ferritin Dr. Hyacinth Meeker   Rosacea    Sleep apnea    Uterine carcinoma [facility])    Dr. Clifton James    Past Surgical History:  Procedure Laterality Date   ABDOMINAL EXPOSURE N/A 12/07/2021   Procedure: ABDOMINAL EXPOSURE;  Surgeon: Cephus Shelling, MD;  Location: [facility] OR;  Service: Vascular;  Laterality: N/A;   ABDOMINAL HYSTERECTOMY     bladder tack     x 2   BUNIONECTOMY     CALCANEAL OSTEOTOMY Right 01/04/2023   Procedure: CALCANEAL OSTEOTOMY;  Surgeon: Toni Arthurs,  MD;  Location: [facility];  Service: Orthopedics;  Laterality: Right;   CHOLECYSTECTOMY  2009   COLONOSCOPY     scr, 12/2008 ([facility]) nl   COLOSTOMY Left 06/05/2018   Procedure: COLOSTOMY;  Surgeon: Axel Filler, MD;  Location: [facility] OR;  Service: General;  Laterality: Left;   COLOSTOMY REVERSAL N/A 10/16/2018   Procedure: HARTMANN'S COLOSTOMY REVERSAL, RIGID PROCTOSCOPY;  Surgeon: Axel Filler, MD;  Location: [facility];  Service: General;  Laterality: N/A;   GASTROCNEMIUS RECESSION Right 01/04/2023   Procedure: GASTROCNEMIUS recession;  Surgeon: Toni Arthurs, MD;  Location: [facility];  Service:  Orthopedics;  Laterality: Right;   HAMMER TOE SURGERY     HYSTERECTOMY ABDOMINAL WITH SALPINGO-OOPHORECTOMY  2008   INCISIONAL HERNIA REPAIR  04/09/2019   x2   INCISIONAL HERNIA REPAIR N/A 04/09/2019   Procedure: INCISIONAL HERNIA REPAIR WITH MESH;  Surgeon: Axel Filler, MD;  Location: [facility] OR;  Service: General;  Laterality: N/A;   LAPAROSCOPY N/A 10/16/2018   Procedure: LAPAROSCOPY DIAGNOSTIC WITH LYSIS OF ADHESIONS;  Surgeon: Axel Filler, MD;  Location: [facility];  Service: General;  Laterality: N/A;   LUNG BIOPSY     L Lower lung pulm nodules, largest 4.1mm, low risk, repeat CT in 1 yr now following with pulm at [facility]; 9/13 Stable tiny lung nodules, no f/u needed   LYMPH NODE DISSECTION  2008   LYSIS OF ADHESION N/A 04/09/2019   Procedure: OPEN LYSIS OF ADHESION;  Surgeon: Axel Filler, MD;  Location: [facility] OR;  Service: General;  Laterality: N/A;   METATARSAL OSTEOTOMY     NECK SURGERY     x 2   OBLIQUE LUMBAR INTERBODY FUSION 1 LEVEL WITH PERCUTANEOUS SCREWS N/A 12/07/2021   Procedure: OBLIQUE LUMBAR INTERBODY FUSION 1 LEVEL WITH PERCUTANEOUS SCREWS (OLIF L4-5 WITH POSTERIOR SPINAL FUSION AND PEDICLE SCREWS);  Surgeon: Venita Lick, MD;  Location: [facility] OR;  Service: Orthopedics;  Laterality: N/A;  4 HRS DR. CLARK TO DO APPROACH LEFT TAP BLOCK WITH EXPAREL 3 C-BED   PARTIAL COLECTOMY N/A 06/05/2018   Procedure: EXPLORATORY LAPAROTOMY AND PARTIAL COLECTOMY;  Surgeon: Axel Filler, MD;  Location: [facility] OR;  Service: General;  Laterality: N/A;   POSTERIOR TIBIAL TENDON REPAIR Right 01/04/2023   Procedure: POSTERIOR TIBIAL TENDON TENOLYSIS AND  flexor digitorum longus TRANSFER TO NAVICULAR;  Surgeon: Toni Arthurs, MD;  Location: [facility];  Service: Orthopedics;  Laterality: Right;   TONSILLECTOMY     US ECHOCARDIOGRAPHY     with Nuclear test, Dr. Anne Fu, Low risk   VESICOVAGINAL FISTULA CLOSURE W/ TAH       reports that she has never smoked. She has never used  smokeless tobacco. She reports current alcohol use. She reports that she does not use drugs.  Allergies  Allergen Reactions   Aspirin Anaphylaxis    In higher doses   Bee Venom Shortness Of Breath, Itching, Swelling and Hives   Peanut-Containing Drug Products Anaphylaxis and Other (See Comments)   Ambien [Zolpidem Tartrate] Other (See Comments)    Hallucinations   Tape Itching, Swelling and Rash   Ciprofloxacin Nausea And Vomiting   Latex Rash   Minocycline Rash   Shellfish Allergy Rash    Family History  Problem Relation Age of Onset   Hashimoto's thyroiditis Mother    High blood pressure Mother    Stroke Mother    Other Mother        Borderline DM   Alzheimer's disease Mother    Ulcerative  colitis Father    Meniere's disease Father    Hearing loss Father    Stroke Father    Stroke Sister     Prior to Admission medications   Medication Sig Start Date End Date Taking? Authorizing Provider  acetaminophen (TYLENOL) 500 MG tablet Take 2 tablets (1,000 mg total) by mouth every 6 (six) hours as needed. 06/14/18   Barnetta Chapel, PA-C  albuterol (PROVENTIL HFA;VENTOLIN HFA) 108 (90 Base) MCG/ACT inhaler Inhale 2 puffs into the lungs every 4 (four) hours as needed for wheezing or shortness of breath. 06/13/16   [provider]  ARIPiprazole (ABILIFY) 2 MG tablet TAKE 1 TABLET BY MOUTH DAILY 06/04/23   [name], MD  Ascorbic Acid (VITAMIN C) 1000 MG tablet Take 1,000 mg by mouth in the morning.    [provider]  atorvastatin (LIPITOR) 10 MG tablet TAKE 1 TABLET BY MOUTH DAILY 06/13/23   Jake Bathe, MD  buPROPion (WELLBUTRIN XL) 150 MG 24 hr tablet Take 3 tablets (450 mg total) by mouth daily. 05/01/23   [name], MD  cyanocobalamin (VITAMIN B12) 1000 MCG/ML injection Inject 1 mL (1,000 mcg total) into the muscle every 30 (thirty) days. 11/29/22   [name], MD  docusate sodium (COLACE) 100 MG capsule Take 1 capsule (100 mg total) by mouth 2  (two) times daily. While taking narcotic pain medicine. 01/04/23   Jacinta Shoe, PA-C  doxycycline (PERIOSTAT) 20 MG tablet Take 20 mg by mouth 2 (two) times daily as needed.    [provider]  DULoxetine (CYMBALTA) 30 MG capsule Take 90 mg by mouth at bedtime.    [provider]  EPINEPHrine 0.3 mg/0.3 mL IJ SOAJ injection Inject 0.3 mg into the muscle as needed for anaphylaxis.    [provider]  fexofenadine (ALLEGRA) 180 MG tablet Take 180 mg by mouth daily.    [provider]  fluticasone-salmeterol (ADVAIR HFA) 115-21 MCG/ACT inhaler Inhale 2 puffs into the lungs 2 (two) times daily.    [provider]  Fluticasone-Umeclidin-Vilant (TRELEGY ELLIPTA) 100-62.5-25 MCG/ACT AEPB Inhale 1 puff into the lungs daily.    [provider]  hydroxychloroquine (PLAQUENIL) 200 MG tablet Take 200 mg by mouth 2 (two) times daily with a meal.    [provider]  ipratropium (ATROVENT) 0.06 % nasal spray Place 2 sprays into both nostrils in the morning and at bedtime. 01/02/20   [provider]  ketotifen (ZADITOR) 0.025 % ophthalmic solution Place 1 drop into both eyes in the morning.    [provider]  levothyroxine (SYNTHROID) 175 MCG tablet Take 175 mcg by mouth daily before breakfast. 06/10/23   [provider]  Levothyroxine Sodium 175 MCG CAPS Take 175 mcg by mouth daily before breakfast.    [provider]  metoprolol succinate (TOPROL-XL) 25 MG 24 hr tablet TAKE 1 TABLET BY MOUTH ONCE  DAILY 06/13/23   Jake Bathe, MD  nitroGLYCERIN (NITROSTAT) 0.4 MG SL tablet Place 1 tablet (0.4 mg total) under the tongue every 5 (five) minutes as needed for chest pain. 02/09/22   Orbie Pyo, MD  omalizumab Geoffry Paradise) 150 MG injection 300 mg every 30 (thirty) days. On the 13th of the month    [provider]  pantoprazole (PROTONIX) 40 MG tablet Take 80 mg by mouth 2 (two) times daily. 08/09/20    [provider]  polyethylene glycol (MIRALAX / GLYCOLAX) 17 g packet Take 17 g by  mouth daily as needed (constipation.).    [provider]  pramipexole (MIRAPEX) 0.125 MG tablet Take 3 tablets (0.375 mg total) by mouth at bedtime as needed (restless legs syndrome). 02/27/22   [name], MD  pregabalin (LYRICA) 75 MG capsule Take 1 capsule (75 mg total) by mouth 2 (two) times daily. 08/16/22   [name], MD  Probiotic Product (PROBIOTIC PO) Take 1 capsule by mouth in the morning.    [provider]  rivaroxaban (XARELTO) 10 MG TABS tablet Take 1 tablet (10 mg total) by mouth daily. 01/04/23   Jacinta Shoe, PA-C  senna (SENOKOT) 8.6 MG TABS tablet Take 2 tablets (17.2 mg total) by mouth 2 (two) times daily. 01/04/23   Jacinta Shoe, PA-C  Syringe, Disposable, (B-D SYRINGE LUER-LOK 3CC) 3 ML MISC     [provider]  Dwyane Luo 200-62.5-25 MCG/ACT AEPB Inhale 1 puff into the lungs daily. 05/22/23   [provider]    Physical Exam: Vitals:   06/13/23 0949 06/13/23 0950 06/13/23 1300 06/13/23 1404  BP: (!) 130/90  113/63 (!) 123/52  Pulse: 67  64 62  Resp: 18  16 16   Temp: 98.1 F (36.7 C)   98.3 F (36.8 C)  TempSrc: Oral   Oral  SpO2: 98%  92% 98%  Weight:  72.6 kg    Height:  5\' 5"  (1.651 m)      Constitutional: NAD, calm, comfortable Vitals:   06/13/23 0949 06/13/23 0950 06/13/23 1300 06/13/23 1404  BP: (!) 130/90  113/63 (!) 123/52  Pulse: 67  64 62  Resp: 18  16 16   Temp: 98.1 F (36.7 C)   98.3 F (36.8 C)  TempSrc: Oral   Oral  SpO2: 98%  92% 98%  Weight:  72.6 kg    Height:  5\' 5"  (1.651 m)     Eyes: PERRL, lids and conjunctivae normal ENMT: Mucous membranes are moist. Posterior pharynx clear of any exudate or lesions.Normal dentition.  Neck: normal, supple, no masses, no thyromegaly Respiratory: clear to auscultation bilaterally, no wheezing, no crackles. Normal respiratory effort. No accessory  muscle use.  Cardiovascular: Regular rate and rhythm, no murmurs / rubs / gallops. No extremity edema. 2+ pedal pulses.  Abdomen: + tenderness lower left qaud, epigastrium and right lower quad. Hypoactive bs. no masses palpated. No hepatosplenomegaly.  Musculoskeletal: no clubbing / cyanosis. No joint deformity upper and lower extremities. Good ROM, no contractures. Normal muscle tone.  Skin: no rashes, lesions, ulcers. No induration Neurologic: CN 2-12 grossly intact. Sensation intact,  Strength 5/5 in all 4.  Psychiatric: Normal judgment and insight. Alert and oriented x 3. Normal mood.    Labs on Admission: I have personally reviewed following labs and imaging studies  CBC: Recent Labs  Lab 06/13/23 1005 06/13/23 1013  WBC 13.9*  --   NEUTROABS 11.4*  --   HGB 14.5 15.3*  HCT 44.5 45.0  MCV 94.1  --   PLT 402*  --    Basic Metabolic Panel: Recent Labs  Lab 06/13/23 1005 06/13/23 1013  NA 141 142  K 3.8 3.9  CL 101 101  CO2 27  --   GLUCOSE 125* 122*  BUN 15 17  CREATININE 1.07* 1.00  CALCIUM 9.7  --   MG 2.2  --    GFR: Estimated Creatinine Clearance: 53.7 mL/min (by C-G formula based on SCr of 1 mg/dL). Liver Function Tests: Recent Labs  Lab 06/13/23 1005  AST  22  ALT 16  ALKPHOS 98  BILITOT 0.7  PROT 7.0  ALBUMIN 4.0   Recent Labs  Lab 06/13/23 1005  LIPASE 33   No results for input(s): "AMMONIA" in the last 168 hours. Coagulation Profile: No results for input(s): "INR", "PROTIME" in the last 168 hours. Cardiac Enzymes: No results for input(s): "CKTOTAL", "CKMB", "CKMBINDEX", "TROPONINI" in the last 168 hours. BNP (last 3 results) No results for input(s): "PROBNP" in the last 8760 hours. HbA1C: No results for input(s): "HGBA1C" in the last 72 hours. CBG: No results for input(s): "GLUCAP" in the last 168 hours. Lipid Profile: No results for input(s): "CHOL", "HDL", "LDLCALC", "TRIG", "CHOLHDL", "LDLDIRECT" in the last 72 hours. Thyroid Function  Tests: No results for input(s): "TSH", "T4TOTAL", "FREET4", "T3FREE", "THYROIDAB" in the last 72 hours. Anemia Panel: No results for input(s): "VITAMINB12", "FOLATE", "FERRITIN", "TIBC", "IRON", "RETICCTPCT" in the last 72 hours. Urine analysis:    Component Value Date/Time   COLORURINE YELLOW 06/13/2023 1005   APPEARANCEUR CLEAR 06/13/2023 1005   LABSPEC 1.020 06/13/2023 1005   PHURINE 7.0 06/13/2023 1005   GLUCOSEU NEGATIVE 06/13/2023 1005   HGBUR NEGATIVE 06/13/2023 1005   BILIRUBINUR NEGATIVE 06/13/2023 1005   KETONESUR NEGATIVE 06/13/2023 1005   PROTEINUR NEGATIVE 06/13/2023 1005   NITRITE NEGATIVE 06/13/2023 1005   LEUKOCYTESUR NEGATIVE 06/13/2023 1005    Radiological Exams on Admission: CT ABDOMEN PELVIS W CONTRAST  Result Date: 06/13/2023 CLINICAL DATA:  Abdominal pain nonlocalized EXAM: CT ABDOMEN AND PELVIS WITH CONTRAST TECHNIQUE: Multidetector CT imaging of the abdomen and pelvis was performed using the standard protocol following bolus administration of intravenous contrast. RADIATION DOSE REDUCTION: This exam was performed according to the departmental dose-optimization program which includes automated exposure control, adjustment of the mA and/or kV according to patient size and/or use of iterative reconstruction technique. CONTRAST:  75mL OMNIPAQUE IOHEXOL 350 MG/ML SOLN COMPARISON:  CT without contrast 312 20 FINDINGS: Lower chest: Mild linear opacity lung bases likely scar or atelectasis no pleural effusion. Small air cysts along the left lower lobe on series 5 image 10. There is some bronchiectasis along the lung bases. No pleural effusion 3 mm nodule left lower lobe series 5 image 4 and just above the diaphragm on the left on series 5, image 23. These are unchanged from prior examination demonstrating long-term stability. No additional follow-up. Hepatobiliary: No focal liver abnormality is seen. Status post cholecystectomy. No biliary dilatation. Patent portal vein.  Pancreas: Unremarkable. No pancreatic ductal dilatation or surrounding inflammatory changes. Spleen: Normal in size without focal abnormality. Small anterior splenule. Adrenals/Urinary Tract: The adrenal glands are preserved. No enhancing renal mass or collecting system dilatation. The ureters have normal course and caliber extending down to the bladder. Preserved contours of the urinary bladder. Stomach/Bowel: Stomach is distended with fluid. There is also fluid along the duodenal and jejunum with some mildly dilated loops of jejunum. Slow transition to nondistended loops in the central anterior abdomen and pelvis. The ileum is relatively decompressed. There is some fluid and stranding along the mesentery adjacent to these loops there is large amount of stool in the right side of the colon and cecum with the cecum in the central pelvis above the bladder just right of midline. Otherwise the colon more distally has a normal course and caliber. Surgical changes along the rectosigmoid region. The appendix is poorly seen. No pericecal stranding or fluid. Vascular/Lymphatic: Aortic atherosclerosis. No enlarged abdominal or pelvic lymph nodes. Reproductive: Status post hysterectomy. No adnexal masses. Other: No free  intra-air. Musculoskeletal: Degenerative changes of the spine and pelvis. Fixation hardware at L4-5. Associated streak artifact. IMPRESSION: Fluid-filled stomach and proximal small bowel, duodenal and jejunum with some dilatation of the jejunum up to 3.2 cm. There is a somewhat slow transition to decompressed loops in the anterior upper pelvis in the lower abdomen with some surrounding mesenteric stranding and fluid. No pneumatosis. This could be a mild, low-grade obstruction that is developing versus ileus. Please correlate with clinical symptoms and recommend follow-up. No free air. Surgical changes along the rectosigmoid colon. Electronically Signed   By: Karen Kays M.D.   On: 06/13/2023 12:00    EKG:  Independently reviewed. See above  Assessment/Plan  Early SBO vs ileus due to possible gastroenteritis  -admit to tele  -patient with continue nausea and abd pain will decompress with NG tube  - supportive ivfs/ antiemetics as well as non-opioid pain medication  -appreciate surgery input will follow recs / patient placed on gastrograffin protocol -KUB in am  -monitor fever curve and wbc count  -leukocytosis -possible stress but cannot r/o infection  -monitor  lactic and inflammatory markers  SVT -no active issues  -will ensure electrolytes remain stable    Asthma -no acute exacerbation  -prn nebs   Hypothyroidism -if npo x >48 hours will consider iv supplementation   GERD -ppi  OSA  -O2 at bedtime  -monitor pulse ox   DVT prophylaxis: scd Code Status: full/ as discussed per patient wishes in event of cardiac arrest  Family Communication:   Hesler,William (Spouse) [phone] (Home Phone)   Disposition Plan: patient  expected to be admitted greater than 2 midnights  Consults called: Surgery Fredricka Bonine Admission status: med tele   Lurline Del MD [facility]   If 7PM-7AM, please contact night-coverage [email or web address] Password [facility]  06/13/2023, 2:55 PM

## 2023-06-13 NOTE — ED Provider Notes (Signed)
[place] EMERGENCY DEPARTMENT AT [facility] Provider Note   CSN: [id number] Arrival date & time: 06/13/23  4259     History  Chief Complaint  Patient presents with   Abdominal Pain    Hx of colon obstructions in the past. 4 days of no BM. Reports lower abd bloating and nausea.     [name] is a 67 y.o. female.   Abdominal Pain Associated symptoms: constipation and nausea   Patient presents for abdominal pain.  Medical history includes asthma, chronic fatigue, chronic pain, sigmoid volvulus, bowel perforation, prior colostomy, fibromyalgia, arthritis, GERD.  Typically she will have a bowel movement every 2 days.  Last BM was 4 days ago.  Last night, she developed pain and bloating across her lower abdomen with nausea.  Symptoms have persisted this morning.  Current pain is 9/10 in severity.  She continues to have nausea.  She has not yet vomited.  She does feel that her abdomen is distended.     Home Medications Prior to Admission medications   Medication Sig Start Date End Date Taking? Authorizing Provider  acetaminophen (TYLENOL) 500 MG tablet Take 2 tablets (1,000 mg total) by mouth every 6 (six) hours as needed. 06/14/18   Barnetta Chapel, PA-C  albuterol (PROVENTIL HFA;VENTOLIN HFA) 108 (90 Base) MCG/ACT inhaler Inhale 2 puffs into the lungs every 4 (four) hours as needed for wheezing or shortness of breath. 06/13/16   [provider]  ARIPiprazole (ABILIFY) 2 MG tablet TAKE 1 TABLET BY MOUTH DAILY 06/04/23   [name], MD  Ascorbic Acid (VITAMIN C) 1000 MG tablet Take 1,000 mg by mouth in the morning.    [provider]  atorvastatin (LIPITOR) 10 MG tablet TAKE 1 TABLET BY MOUTH DAILY 06/13/23   Jake Bathe, MD  buPROPion (WELLBUTRIN XL) 150 MG 24 hr tablet Take 3 tablets (450 mg total) by mouth daily. 05/01/23   [name], MD  cyanocobalamin (VITAMIN B12) 1000 MCG/ML injection Inject 1 mL (1,000 mcg total) into the muscle every 30  (thirty) days. 11/29/22   [name], MD  docusate sodium (COLACE) 100 MG capsule Take 1 capsule (100 mg total) by mouth 2 (two) times daily. While taking narcotic pain medicine. 01/04/23   Jacinta Shoe, PA-C  doxycycline (PERIOSTAT) 20 MG tablet Take 20 mg by mouth 2 (two) times daily as needed.    [provider]  DULoxetine (CYMBALTA) 30 MG capsule Take 90 mg by mouth at bedtime.    [provider]  EPINEPHrine 0.3 mg/0.3 mL IJ SOAJ injection Inject 0.3 mg into the muscle as needed for anaphylaxis.    [provider]  fexofenadine (ALLEGRA) 180 MG tablet Take 180 mg by mouth daily.    [provider]  fluticasone-salmeterol (ADVAIR HFA) 115-21 MCG/ACT inhaler Inhale 2 puffs into the lungs 2 (two) times daily.    [provider]  Fluticasone-Umeclidin-Vilant (TRELEGY ELLIPTA) 100-62.5-25 MCG/ACT AEPB Inhale 1 puff into the lungs daily.    [provider]  hydroxychloroquine (PLAQUENIL) 200 MG tablet Take 200 mg by mouth 2 (two) times daily with a meal.    [provider]  ipratropium (ATROVENT) 0.06 % nasal spray Place 2 sprays into both nostrils in the morning and at bedtime. 01/02/20   [provider]  ketotifen (ZADITOR) 0.025 % ophthalmic solution Place 1 drop into both eyes in the morning.    [provider]  levothyroxine (SYNTHROID) 175 MCG tablet Take  175 mcg by mouth daily before breakfast. 06/10/23   [provider]  Levothyroxine Sodium 175 MCG CAPS Take 175 mcg by mouth daily before breakfast.    [provider]  metoprolol succinate (TOPROL-XL) 25 MG 24 hr tablet TAKE 1 TABLET BY MOUTH ONCE  DAILY 06/13/23   Jake Bathe, MD  nitroGLYCERIN (NITROSTAT) 0.4 MG SL tablet Place 1 tablet (0.4 mg total) under the tongue every 5 (five) minutes as needed for chest pain. 02/09/22   Orbie Pyo, MD  omalizumab Geoffry Paradise) 150 MG injection 300 mg every 30 (thirty) days. On the 13th of the  month    [provider]  pantoprazole (PROTONIX) 40 MG tablet Take 80 mg by mouth 2 (two) times daily. 08/09/20   [provider]  polyethylene glycol (MIRALAX / GLYCOLAX) 17 g packet Take 17 g by mouth daily as needed (constipation.).    [provider]  pramipexole (MIRAPEX) 0.125 MG tablet Take 3 tablets (0.375 mg total) by mouth at bedtime as needed (restless legs syndrome). 02/27/22   [name], MD  pregabalin (LYRICA) 75 MG capsule Take 1 capsule (75 mg total) by mouth 2 (two) times daily. 08/16/22   [name], MD  Probiotic Product (PROBIOTIC PO) Take 1 capsule by mouth in the morning.    [provider]  rivaroxaban (XARELTO) 10 MG TABS tablet Take 1 tablet (10 mg total) by mouth daily. 01/04/23   Jacinta Shoe, PA-C  senna (SENOKOT) 8.6 MG TABS tablet Take 2 tablets (17.2 mg total) by mouth 2 (two) times daily. 01/04/23   Jacinta Shoe, PA-C  Syringe, Disposable, (B-D SYRINGE LUER-LOK 3CC) 3 ML MISC     [provider]  Dwyane Luo 200-62.5-25 MCG/ACT AEPB Inhale 1 puff into the lungs daily. 05/22/23   [provider]      Allergies    Aspirin, Bee venom, Peanut-containing drug products, Ambien [zolpidem tartrate], Tape, Ciprofloxacin, Latex, Minocycline, and Shellfish allergy    Review of Systems   Review of Systems  Gastrointestinal:  Positive for abdominal distention, abdominal pain, constipation and nausea.  All other systems reviewed and are negative.   Physical Exam Updated Vital Signs BP (!) 123/52 (BP Location: Right Arm)   Pulse 62   Temp 98.3 F (36.8 C) (Oral)   Resp 16   Ht 5\' 5"  (1.651 m)   Wt 72.6 kg   SpO2 98%   BMI 26.63 kg/m  Physical Exam Vitals and nursing note reviewed.  Constitutional:      General: She is not in acute distress.    Appearance: She is well-developed. She is not ill-appearing, toxic-appearing or diaphoretic.  HENT:     Head: Normocephalic and atraumatic.      Mouth/Throat:     Mouth: Mucous membranes are moist.  Eyes:     General: No scleral icterus.    Conjunctiva/sclera: Conjunctivae normal.  Cardiovascular:     Rate and Rhythm: Normal rate and regular rhythm.  Pulmonary:     Effort: Pulmonary effort is normal. No respiratory distress.  Abdominal:     General: There is distension.     Palpations: Abdomen is soft.     Tenderness: There is generalized abdominal tenderness. There is no guarding or rebound.  Musculoskeletal:        General: No swelling.     Cervical back: Neck supple.  Skin:    General: Skin is warm and dry.  Neurological:     General:  No focal deficit present.     Mental Status: She is alert and oriented to person, place, and time.  Psychiatric:        Mood and Affect: Mood normal.        Behavior: Behavior normal.     ED Results / Procedures / Treatments   Labs (all labs ordered are listed, but only abnormal results are displayed) Labs Reviewed  COMPREHENSIVE METABOLIC PANEL - Abnormal; Notable for the following components:      Result Value   Glucose, Bld 125 (*)    Creatinine, Ser 1.07 (*)    GFR, Estimated 57 (*)    All other components within normal limits  CBC WITH DIFFERENTIAL/PLATELET - Abnormal; Notable for the following components:   WBC 13.9 (*)    Platelets 402 (*)    Neutro Abs 11.4 (*)    All other components within normal limits  CBC - Abnormal; Notable for the following components:   WBC 14.4 (*)    All other components within normal limits  I-STAT CHEM 8, ED - Abnormal; Notable for the following components:   Glucose, Bld 122 (*)    Hemoglobin 15.3 (*)    All other components within normal limits  LIPASE, BLOOD  URINALYSIS, ROUTINE W REFLEX MICROSCOPIC  MAGNESIUM  HEMOGLOBIN A1C  HIV ANTIBODY (ROUTINE TESTING W REFLEX)  COMPREHENSIVE METABOLIC PANEL  MAGNESIUM  PHOSPHORUS  URINALYSIS, ROUTINE W REFLEX MICROSCOPIC    EKG None  Radiology CT ABDOMEN PELVIS W CONTRAST  Result  Date: 06/13/2023 CLINICAL DATA:  Abdominal pain nonlocalized EXAM: CT ABDOMEN AND PELVIS WITH CONTRAST TECHNIQUE: Multidetector CT imaging of the abdomen and pelvis was performed using the standard protocol following bolus administration of intravenous contrast. RADIATION DOSE REDUCTION: This exam was performed according to the departmental dose-optimization program which includes automated exposure control, adjustment of the mA and/or kV according to patient size and/or use of iterative reconstruction technique. CONTRAST:  75mL OMNIPAQUE IOHEXOL 350 MG/ML SOLN COMPARISON:  CT without contrast 312 20 FINDINGS: Lower chest: Mild linear opacity lung bases likely scar or atelectasis no pleural effusion. Small air cysts along the left lower lobe on series 5 image 10. There is some bronchiectasis along the lung bases. No pleural effusion 3 mm nodule left lower lobe series 5 image 4 and just above the diaphragm on the left on series 5, image 23. These are unchanged from prior examination demonstrating long-term stability. No additional follow-up. Hepatobiliary: No focal liver abnormality is seen. Status post cholecystectomy. No biliary dilatation. Patent portal vein. Pancreas: Unremarkable. No pancreatic ductal dilatation or surrounding inflammatory changes. Spleen: Normal in size without focal abnormality. Small anterior splenule. Adrenals/Urinary Tract: The adrenal glands are preserved. No enhancing renal mass or collecting system dilatation. The ureters have normal course and caliber extending down to the bladder. Preserved contours of the urinary bladder. Stomach/Bowel: Stomach is distended with fluid. There is also fluid along the duodenal and jejunum with some mildly dilated loops of jejunum. Slow transition to nondistended loops in the central anterior abdomen and pelvis. The ileum is relatively decompressed. There is some fluid and stranding along the mesentery adjacent to these loops there is large amount of  stool in the right side of the colon and cecum with the cecum in the central pelvis above the bladder just right of midline. Otherwise the colon more distally has a normal course and caliber. Surgical changes along the rectosigmoid region. The appendix is poorly seen. No pericecal stranding or fluid. Vascular/Lymphatic: Aortic  atherosclerosis. No enlarged abdominal or pelvic lymph nodes. Reproductive: Status post hysterectomy. No adnexal masses. Other: No free intra-air. Musculoskeletal: Degenerative changes of the spine and pelvis. Fixation hardware at L4-5. Associated streak artifact. IMPRESSION: Fluid-filled stomach and proximal small bowel, duodenal and jejunum with some dilatation of the jejunum up to 3.2 cm. There is a somewhat slow transition to decompressed loops in the anterior upper pelvis in the lower abdomen with some surrounding mesenteric stranding and fluid. No pneumatosis. This could be a mild, low-grade obstruction that is developing versus ileus. Please correlate with clinical symptoms and recommend follow-up. No free air. Surgical changes along the rectosigmoid colon. Electronically Signed   By: Karen Kays M.D.   On: 06/13/2023 12:00    Procedures Procedures    Medications Ordered in ED Medications  diatrizoate meglumine-sodium (GASTROGRAFIN) 66-10 % solution 90 mL (has no administration in time range)  heparin injection 5,000 Units (has no administration in time range)  0.9 %  sodium chloride infusion (has no administration in time range)  ondansetron (ZOFRAN) tablet 4 mg (has no administration in time range)    Or  ondansetron (ZOFRAN) injection 4 mg (has no administration in time range)  albuterol (PROVENTIL) (2.5 MG/3ML) 0.083% nebulizer solution 2.5 mg (has no administration in time range)  ondansetron (ZOFRAN) injection 4 mg (4 mg Intravenous Given 06/13/23 1538)  HYDROmorphone (DILAUDID) injection 0.5-1 mg (1 mg Intravenous Given 06/13/23 1538)  pneumococcal 20-valent  conjugate vaccine (PREVNAR 20) injection 0.5 mL (has no administration in time range)  acetaminophen (OFIRMEV) IV 1,000 mg (has no administration in time range)  HYDROmorphone (DILAUDID) injection 0.5 mg (0.5 mg Intravenous Given 06/13/23 1020)  ondansetron (ZOFRAN) injection 4 mg (4 mg Intravenous Given 06/13/23 1020)  lactated ringers bolus 500 mL (0 mLs Intravenous Stopped 06/13/23 1114)  iohexol (OMNIPAQUE) 350 MG/ML injection 75 mL (75 mLs Intravenous Contrast Given 06/13/23 1049)    ED Course/ Medical Decision Making/ A&P                                 Medical Decision Making Amount and/or Complexity of Data Reviewed Labs: ordered. Radiology: ordered.  Risk Prescription drug management. Decision regarding hospitalization.   This patient presents to the ED for concern of abdominal pain, this involves an extensive number of treatment options, and is a complaint that carries with it a high risk of complications and morbidity.  The differential diagnosis includes obstruction, colitis, constipation, other intra-abdominal infection, hernia, UTI   Co morbidities that complicate the patient evaluation  asthma, chronic fatigue, chronic pain, sigmoid volvulus, bowel perforation, prior colostomy, fibromyalgia, arthritis, GERD   Additional history obtained:  Additional history obtained from N/A External records from outside source obtained and reviewed including EMR   Lab Tests:  I Ordered, and personally interpreted labs.  The pertinent results include: Leukocytosis is present.  Hemoglobin is normal.  Kidney function and electrolytes are normal.  There is no evidence of UTI.   Imaging Studies ordered:  I ordered imaging studies including CT of abdomen and pelvis I independently visualized and interpreted imaging which showed findings consistent with low-grade obstruction I agree with the radiologist interpretation   Cardiac Monitoring: / EKG:  The patient was maintained on a  cardiac monitor.  I personally viewed and interpreted the cardiac monitored which showed an underlying rhythm of: Sinus rhythm   Consultations Obtained:  I requested consultation with the general surgery,  and discussed  lab and imaging findings as well as pertinent plan - they recommend: Will see in consult   Problem List / ED Course / Critical interventions / Medication management  Patient presenting for abdominal pain and nausea.  Onset was last night.  She has a history of bowel obstruction and required prior surgery.  She had a colostomy for a while but this has since been reversed.  She has not had a bowel movement in the past 4 days.  She developed distention, pain, nausea last night.  Symptoms persisted today.  Vital signs on arrival are normal.  On exam, patient is well-appearing.  She does have lower abdominal tenderness.  Dilaudid and Zofran ordered for symptomatic relief.  Lab work and CT imaging were ordered.  CT imaging did in fact show evidence of low-grade obstruction.  On reassessment, patient states that her symptoms are relieved.  Will hold off on NG tube for now.  I did speak with general surgery who will see the patient in consult.  Patient was admitted to medicine for further management I ordered medication including IV fluids for hydration; Dilaudid for analgesia; Zofran for nausea Reevaluation of the patient after these medicines showed that the patient improved I have reviewed the patients home medicines and have made adjustments as needed   Social Determinants of Health:  Has access to outpatient care         Final Clinical Impression(s) / ED Diagnoses Final diagnoses:  Small bowel obstruction [facility])    Rx / DC Orders ED Discharge Orders     None         Gloris Manchester, MD 06/13/23 1623

## 2023-06-13 NOTE — ED Notes (Signed)
ED TO INPATIENT HANDOFF REPORT  ED Nurse Name and Phone #: Leavy Cella [phone]  S Name/Age/Gender [name] 67 y.o. female Room/Bed: 037C/037C  Code Status   Code Status: Prior  Home/SNF/Other Home Patient oriented to: self, place, time, and situation Is this baseline? Yes   Triage Complete: Triage complete  Chief Complaint Bowel obstruction (HCC) [K56.609]  Triage Note No notes on file   Allergies Allergies  Allergen Reactions   Aspirin Anaphylaxis    In higher doses   Bee Venom Shortness Of Breath, Itching, Swelling and Hives   Peanut-Containing Drug Products Anaphylaxis and Other (See Comments)   Ambien [Zolpidem Tartrate] Other (See Comments)    Hallucinations   Tape Itching, Swelling and Rash   Ciprofloxacin Nausea And Vomiting   Latex Rash   Minocycline Rash   Shellfish Allergy Rash    Level of Care/Admitting Diagnosis ED Disposition     ED Disposition  Admit   Condition  --   Comment  Hospital Area: [facility] [[id number]]  Level of Care: Telemetry Medical [104]  May admit patient to [facility] or [facility] if equivalent level of care is available:: No  Covid Evaluation: Asymptomatic - no recent exposure (last 10 days) testing not required  Diagnosis: Bowel obstruction [facility]) [[id number]]  Admitting Physician: Lurline Del [[id number]]  Attending Physician: Lurline Del [[id number]]  Certification:: I certify this patient will need inpatient services for at least 2 midnights  Expected Medical Readiness: 06/18/2023          B Medical/Surgery History Past Medical History:  Diagnosis Date   Asthma    Back pain    Chronic fatigue syndrome    Dr. Hyacinth Meeker   Complication of anesthesia    Cough    /SOB, PFT's nl, improved with Bronchodilation 8/11   Dysrhythmia    Endometrial cancer (HCC)    Esophageal spasm    GERD Dr. Hyacinth Meeker, Dr. Laural Benes; egd 2006 nl; UGI 3/13 Small HH, moderate GERD, nl motility; seen at [facility] ([place]  egd? neg, sone improvement on sucralfate susp   Fibromyalgia    Foot pain    Dr. Netta Corrigan   Hashimoto's thyroiditis    High triglycerides    Hypothyroidism    Dr. Wynne Dust 4/14   Leukocytoclastic vasculitis (HCC)    Dr. Danella Deis   Low HDL (under 40)    Memory loss    Mitral valve prolapse    Paroxysmal atrial tachycardia    Dr. Anne Fu   Patent foramen ovale    Pernicious anemia    Dr. Hyacinth Meeker   PONV (postoperative nausea and vomiting)    along time ago had nausea ans vomiting after surgery-25 years ago   Reflux    ? delayed gastric emptying--GES nl 01/2012 (6% at 2hrs)   RLS (restless legs syndrome)    low ferritin Dr. Hyacinth Meeker   Rosacea    Sleep apnea    Uterine carcinoma (HCC)    Dr. Clifton James   Past Surgical History:  Procedure Laterality Date   ABDOMINAL EXPOSURE N/A 12/07/2021   Procedure: ABDOMINAL EXPOSURE;  Surgeon: Cephus Shelling, MD;  Location: [facility] OR;  Service: Vascular;  Laterality: N/A;   ABDOMINAL HYSTERECTOMY     bladder tack     x 2   BUNIONECTOMY     CALCANEAL OSTEOTOMY Right 01/04/2023   Procedure: CALCANEAL OSTEOTOMY;  Surgeon: Toni Arthurs, MD;  Location: [facility];  Service: Orthopedics;  Laterality: Right;   CHOLECYSTECTOMY  2009  COLONOSCOPY     scr, 12/2008 ([place]) nl   COLOSTOMY Left 06/05/2018   Procedure: COLOSTOMY;  Surgeon: Axel Filler, MD;  Location: [facility] OR;  Service: General;  Laterality: Left;   COLOSTOMY REVERSAL N/A 10/16/2018   Procedure: HARTMANN'S COLOSTOMY REVERSAL, RIGID PROCTOSCOPY;  Surgeon: Axel Filler, MD;  Location: [facility] ORS;  Service: General;  Laterality: N/A;   GASTROCNEMIUS RECESSION Right 01/04/2023   Procedure: GASTROCNEMIUS recession;  Surgeon: Toni Arthurs, MD;  Location: [facility];  Service: Orthopedics;  Laterality: Right;   HAMMER TOE SURGERY     HYSTERECTOMY ABDOMINAL WITH SALPINGO-OOPHORECTOMY  2008   INCISIONAL HERNIA REPAIR  04/09/2019   x2   INCISIONAL HERNIA REPAIR N/A  04/09/2019   Procedure: INCISIONAL HERNIA REPAIR WITH MESH;  Surgeon: Axel Filler, MD;  Location: [facility] OR;  Service: General;  Laterality: N/A;   LAPAROSCOPY N/A 10/16/2018   Procedure: LAPAROSCOPY DIAGNOSTIC WITH LYSIS OF ADHESIONS;  Surgeon: Axel Filler, MD;  Location: [facility]S;  Service: General;  Laterality: N/A;   LUNG BIOPSY     L Lower lung pulm nodules, largest 4.69mm, low risk, repeat CT in 1 yr now following with pulm at [facility]; 9/13 Stable tiny lung nodules, no f/u needed   LYMPH NODE DISSECTION  2008   LYSIS OF ADHESION N/A 04/09/2019   Procedure: OPEN LYSIS OF ADHESION;  Surgeon: Axel Filler, MD;  Location: [facility] OR;  Service: General;  Laterality: N/A;   METATARSAL OSTEOTOMY     NECK SURGERY     x 2   OBLIQUE LUMBAR INTERBODY FUSION 1 LEVEL WITH PERCUTANEOUS SCREWS N/A 12/07/2021   Procedure: OBLIQUE LUMBAR INTERBODY FUSION 1 LEVEL WITH PERCUTANEOUS SCREWS (OLIF L4-5 WITH POSTERIOR SPINAL FUSION AND PEDICLE SCREWS);  Surgeon: Venita Lick, MD;  Location: [facility] OR;  Service: Orthopedics;  Laterality: N/A;  4 HRS DR. CLARK TO DO APPROACH LEFT TAP BLOCK WITH EXPAREL 3 C-BED   PARTIAL COLECTOMY N/A 06/05/2018   Procedure: EXPLORATORY LAPAROTOMY AND PARTIAL COLECTOMY;  Surgeon: Axel Filler, MD;  [facility];  Service: General;  Laterality: N/A;   POSTERIOR TIBIAL TENDON REPAIR Right 01/04/2023   Procedure: POSTERIOR TIBIAL TENDON TENOLYSIS AND  flexor digitorum longus TRANSFER TO NAVICULAR;  Surgeon: Toni Arthurs, MD;  Location: [facility];  Service: Orthopedics;  Laterality: Right;   TONSILLECTOMY     US ECHOCARDIOGRAPHY     with Nuclear test, Dr. Anne Fu, Low risk   VESICOVAGINAL FISTULA CLOSURE W/ TAH       A IV Location/Drains/Wounds Patient Lines/Drains/Airways Status     Active Line/Drains/Airways     Name Placement date Placement time Site Days   Peripheral IV 06/13/23 20 G Left Antecubital 06/13/23  1000  Antecubital  less than 1    Colostomy LLQ 06/05/18  1833  LLQ  1834   Incision - 5 Ports Abdomen 1: Right;Mid 2: Right;Lower Left;Upper 4: Upper 5: Umbilicus 10/16/18  1223  -- 1701            Intake/Output Last 24 hours No intake or output data in the 24 hours ending 06/13/23 1327  Labs/Imaging Results for orders placed or performed during the hospital encounter of 06/13/23 (from the past 48 hour(s))  Comprehensive metabolic panel     Status: Abnormal   Collection Time: 06/13/23 10:05 AM  Result Value Ref Range   Sodium 141 135 - 145 mmol/L   Potassium 3.8 3.5 - 5.1 mmol/L   Chloride 101 98 - 111 mmol/L   CO2 27  22 - 32 mmol/L   Glucose, Bld 125 (H) 70 - 99 mg/dL    Comment: Glucose reference range applies only to samples taken after fasting for at least 8 hours.   BUN 15 8 - 23 mg/dL   Creatinine, Ser 1.61 (H) 0.44 - 1.00 mg/dL   Calcium 9.7 8.9 - 09.6 mg/dL   Total Protein 7.0 6.5 - 8.1 g/dL   Albumin 4.0 3.5 - 5.0 g/dL   AST 22 15 - 41 U/L   ALT 16 0 - 44 U/L   Alkaline Phosphatase 98 38 - 126 U/L   Total Bilirubin 0.7 0.3 - 1.2 mg/dL   GFR, Estimated 57 (L) >60 mL/min    Comment: (NOTE) Calculated using the CKD-EPI Creatinine Equation (2021)    Anion gap 13 5 - 15    Comment: Performed at [facility], [address], [place], [place] [address]  Lipase, blood     Status: None   Collection Time: 06/13/23 10:05 AM  Result Value Ref Range   Lipase 33 11 - 51 U/L    Comment: Performed at [facility], [address] [place], [place] [address]  CBC with Diff     Status: Abnormal   Collection Time: 06/13/23 10:05 AM  Result Value Ref Range   WBC 13.9 (H) 4.0 - 10.5 K/uL   RBC 4.73 3.87 - 5.11 MIL/uL   Hemoglobin 14.5 12.0 - 15.0 g/dL   HCT 14.7 82.9 - 56.2 %   MCV 94.1 80.0 - 100.0 fL   MCH 30.7 26.0 - 34.0 pg   MCHC 32.6 30.0 - 36.0 g/dL   RDW 13.0 86.5 - 78.4 %   Platelets 402 (H) 150 - 400 K/uL   nRBC 0.0 0.0 - 0.2 %   Neutrophils Relative % 83 %   Neutro Abs 11.4 (H) 1.7  - 7.7 K/uL   Lymphocytes Relative 9 %   Lymphs Abs 1.3 0.7 - 4.0 K/uL   Monocytes Relative 5 %   Monocytes Absolute 0.7 0.1 - 1.0 K/uL   Eosinophils Relative 2 %   Eosinophils Absolute 0.3 0.0 - 0.5 K/uL   Basophils Relative 1 %   Basophils Absolute 0.1 0.0 - 0.1 K/uL   Immature Granulocytes 0 %   Abs Immature Granulocytes 0.06 0.00 - 0.07 K/uL    Comment: Performed at [facility], [address] [place], [place] [address]  Urinalysis, Routine w reflex microscopic -Urine, Clean Catch     Status: None   Collection Time: 06/13/23 10:05 AM  Result Value Ref Range   Color, Urine YELLOW YELLOW   APPearance CLEAR CLEAR   Specific Gravity, Urine 1.020 1.005 - 1.030   pH 7.0 5.0 - 8.0   Glucose, UA NEGATIVE NEGATIVE mg/dL   Hgb urine dipstick NEGATIVE NEGATIVE   Bilirubin Urine NEGATIVE NEGATIVE   Ketones, ur NEGATIVE NEGATIVE mg/dL   Protein, ur NEGATIVE NEGATIVE mg/dL   Nitrite NEGATIVE NEGATIVE   Leukocytes,Ua NEGATIVE NEGATIVE    Comment: Microscopic not done on urines with negative protein, blood, leukocytes, nitrite, or glucose < 500 mg/dL. Performed at [facility], [address], [place], [place] [address]   Magnesium     Status: None   Collection Time: 06/13/23 10:05 AM  Result Value Ref Range   Magnesium 2.2 1.7 - 2.4 mg/dL    Comment: Performed at [facility], [address], [place], [place] [address]  I-stat chem 8, ED (not  at [facility], [facility] or [facility])     Status: Abnormal   Collection Time: 06/13/23 10:13 AM  Result Value Ref Range   Sodium 142 135 - 145 mmol/L   Potassium 3.9 3.5 - 5.1 mmol/L   Chloride 101 98 - 111 mmol/L   BUN 17 8 - 23 mg/dL   Creatinine, Ser 1.61 0.44 - 1.00 mg/dL   Glucose, Bld 096 (H) 70 - 99 mg/dL    Comment: Glucose reference range applies only to samples taken after fasting for at least 8 hours.   Calcium, Ion 1.20 1.15 - 1.40 mmol/L   TCO2 26 22 - 32 mmol/L   Hemoglobin 15.3 (H) 12.0 - 15.0 g/dL   HCT 04.5 40.9 - 81.1 %    CT ABDOMEN PELVIS W CONTRAST  Result Date: 06/13/2023 CLINICAL DATA:  Abdominal pain nonlocalized EXAM: CT ABDOMEN AND PELVIS WITH CONTRAST TECHNIQUE: Multidetector CT imaging of the abdomen and pelvis was performed using the standard protocol following bolus administration of intravenous contrast. RADIATION DOSE REDUCTION: This exam was performed according to the departmental dose-optimization program which includes automated exposure control, adjustment of the mA and/or kV according to patient size and/or use of iterative reconstruction technique. CONTRAST:  75mL OMNIPAQUE IOHEXOL 350 MG/ML SOLN COMPARISON:  CT without contrast 312 20 FINDINGS: Lower chest: Mild linear opacity lung bases likely scar or atelectasis no pleural effusion. Small air cysts along the left lower lobe on series 5 image 10. There is some bronchiectasis along the lung bases. No pleural effusion 3 mm nodule left lower lobe series 5 image 4 and just above the diaphragm on the left on series 5, image 23. These are unchanged from prior examination demonstrating long-term stability. No additional follow-up. Hepatobiliary: No focal liver abnormality is seen. Status post cholecystectomy. No biliary dilatation. Patent portal vein. Pancreas: Unremarkable. No pancreatic ductal dilatation or surrounding inflammatory changes. Spleen: Normal in size without focal abnormality. Small anterior splenule. Adrenals/Urinary Tract: The adrenal glands are preserved. No enhancing renal mass or collecting system dilatation. The ureters have normal course and caliber extending down to the bladder. Preserved contours of the urinary bladder. Stomach/Bowel: Stomach is distended with fluid. There is also fluid along the duodenal and jejunum with some mildly dilated loops of jejunum. Slow transition to nondistended loops in the central anterior abdomen and pelvis. The ileum is relatively decompressed. There is some fluid and stranding along the mesentery adjacent  to these loops there is large amount of stool in the right side of the colon and cecum with the cecum in the central pelvis above the bladder just right of midline. Otherwise the colon more distally has a normal course and caliber. Surgical changes along the rectosigmoid region. The appendix is poorly seen. No pericecal stranding or fluid. Vascular/Lymphatic: Aortic atherosclerosis. No enlarged abdominal or pelvic lymph nodes. Reproductive: Status post hysterectomy. No adnexal masses. Other: No free intra-air. Musculoskeletal: Degenerative changes of the spine and pelvis. Fixation hardware at L4-5. Associated streak artifact. IMPRESSION: Fluid-filled stomach and proximal small bowel, duodenal and jejunum with some dilatation of the jejunum up to 3.2 cm. There is a somewhat slow transition to decompressed loops in the anterior upper pelvis in the lower abdomen with some surrounding mesenteric stranding and fluid. No pneumatosis. This could be a mild, low-grade obstruction that is developing versus ileus. Please correlate with clinical symptoms and recommend follow-up. No free air. Surgical changes along the rectosigmoid colon. Electronically Signed   By: Karen Kays M.D.   On: 06/13/2023 12:00  Pending Labs Unresulted Labs (From admission, onward)    None       Vitals/Pain Today's Vitals   06/13/23 0949 06/13/23 0950 06/13/23 1225  BP: (!) 130/90    Pulse: 67    Resp: 18    Temp: 98.1 F (36.7 C)    TempSrc: Oral    SpO2: 98%    Weight:  72.6 kg   Height:  5\' 5"  (1.651 m)   PainSc:  7  6     Isolation Precautions No active isolations  Medications Medications  HYDROmorphone (DILAUDID) injection 0.5 mg (0.5 mg Intravenous Given 06/13/23 1020)  ondansetron (ZOFRAN) injection 4 mg (4 mg Intravenous Given 06/13/23 1020)  lactated ringers bolus 500 mL (0 mLs Intravenous Stopped 06/13/23 1114)  iohexol (OMNIPAQUE) 350 MG/ML injection 75 mL (75 mLs Intravenous Contrast Given 06/13/23 1049)     Mobility walks     Focused Assessments Cardiac Assessment Handoff:    No results found for: "CKTOTAL", "CKMB", "CKMBINDEX", "TROPONINI" No results found for: "DDIMER" Does the Patient currently have chest pain? No   , Neuro Assessment Handoff:  Swallow screen pass? Yes          Neuro Assessment: Within Defined Limits Neuro Checks:      Has TPA been given? No If patient is a Neuro Trauma and patient is going to OR before floor call report to 4N Charge nurse: [phone] or [phone]  , Pulmonary Assessment Handoff:  Lung sounds:   O2 Device: Room Air      R Recommendations: See Admitting Provider Note  Report given to:   Additional Notes: see admission orders

## 2023-06-13 NOTE — ED Notes (Signed)
Pt states that she is feeling much better. Pain has decreased to a 6/10 and the nausea has subsided some as well.

## 2023-06-14 ENCOUNTER — Inpatient Hospital Stay (HOSPITAL_COMMUNITY): Payer: Medicare Other

## 2023-06-14 DIAGNOSIS — K56699 Other intestinal obstruction unspecified as to partial versus complete obstruction: Secondary | ICD-10-CM | POA: Diagnosis not present

## 2023-06-14 DIAGNOSIS — Z4682 Encounter for fitting and adjustment of non-vascular catheter: Secondary | ICD-10-CM | POA: Diagnosis not present

## 2023-06-14 LAB — COMPREHENSIVE METABOLIC PANEL
ALT: 14 U/L (ref 0–44)
AST: 17 U/L (ref 15–41)
Albumin: 3.7 g/dL (ref 3.5–5.0)
Alkaline Phosphatase: 83 U/L (ref 38–126)
Anion gap: 8 (ref 5–15)
BUN: 14 mg/dL (ref 8–23)
CO2: 27 mmol/L (ref 22–32)
Calcium: 9.1 mg/dL (ref 8.9–10.3)
Chloride: 104 mmol/L (ref 98–111)
Creatinine, Ser: 0.92 mg/dL (ref 0.44–1.00)
GFR, Estimated: >60 mL/min (ref >60)
Glucose, Bld: 88 mg/dL (ref 70–99)
Potassium: 4.2 mmol/L (ref 3.5–5.1)
Sodium: 139 mmol/L (ref 135–145)
Total Bilirubin: 1.1 mg/dL (ref 0.3–1.2)
Total Protein: 6.8 g/dL (ref 6.5–8.1)

## 2023-06-14 LAB — CBC
HCT: 41.2 % (ref 36.0–46.0)
Hemoglobin: 13.7 g/dL (ref 12.0–15.0)
MCH: 32.2 pg (ref 26.0–34.0)
MCHC: 33.3 g/dL (ref 30.0–36.0)
MCV: 96.9 fL (ref 80.0–100.0)
Platelets: 373 10*3/uL (ref 150–400)
RBC: 4.25 MIL/uL (ref 3.87–5.11)
RDW: 12.1 % (ref 11.5–15.5)
WBC: 13 10*3/uL — ABNORMAL HIGH (ref 4.0–10.5)
nRBC: 0 % (ref 0.0–0.2)

## 2023-06-14 MED ORDER — UMECLIDINIUM BROMIDE 62.5 MCG/ACT IN AEPB
1.0000 | INHALATION_SPRAY | Freq: Every day | RESPIRATORY_TRACT | Status: DC
Start: 2023-06-14 — End: 2023-06-14

## 2023-06-14 MED ORDER — FLUTICASONE FUROATE-VILANTEROL 200-25 MCG/ACT IN AEPB
1.0000 | INHALATION_SPRAY | Freq: Every day | RESPIRATORY_TRACT | Status: DC
Start: 2023-06-14 — End: 2023-06-14

## 2023-06-14 MED ORDER — ALBUTEROL SULFATE HFA 108 (90 BASE) MCG/ACT IN AERS: 2.0000 | INHALATION_SPRAY | RESPIRATORY_TRACT | Status: DC | PRN

## 2023-06-14 MED ORDER — ARFORMOTEROL TARTRATE 15 MCG/2ML IN NEBU
15.0000 ug | INHALATION_SOLUTION | Freq: Two times a day (BID) | RESPIRATORY_TRACT | Status: DC
  Administered 2023-06-14 – 2023-06-17 (×6): 15 ug via RESPIRATORY_TRACT
  Filled 2023-06-14 (×6): qty 2

## 2023-06-14 MED ORDER — IPRATROPIUM BROMIDE 0.02 % IN SOLN
0.5000 mg | Freq: Four times a day (QID) | RESPIRATORY_TRACT | Status: DC
  Administered 2023-06-14 – 2023-06-16 (×6): 0.5 mg via RESPIRATORY_TRACT
  Filled 2023-06-14 (×6): qty 2.5

## 2023-06-14 MED ORDER — BUDESONIDE 0.25 MG/2ML IN SUSP
0.2500 mg | Freq: Two times a day (BID) | RESPIRATORY_TRACT | Status: DC
  Administered 2023-06-15 – 2023-06-17 (×5): 0.25 mg via RESPIRATORY_TRACT
  Filled 2023-06-14 (×5): qty 2

## 2023-06-14 NOTE — Hospital Course (Addendum)
66 y.o.f w/ hx of SVT, asthma, hypothyroidism,GERD,OSA , history of multiple abdominal surgeries including hysterectomy, cholesytectomy, colon resection due to ischemic bowel related to adhesions with colostomy with reversal presented to ED with abdominal pain x 1 week with intermittent nausea and symptoms became severe so presented to the ED.  Last BM 4 days PTA In the ED: Vitals fairly stable, afebrile Labs showed stable renal function and electrolytes lipase 33 UA negative, EKG NSR CTAB/pelvis>>fluid-filled stomach and proximal small bowel, duodenal and jejunum with some dilatation of the jejunum up to 3.2 cm. There is a somewhat slow transition to decompressed loops in the anterior upper pelvis in the lower abdomen with some surrounding mesenteric stranding and fluid. No pneumatosis. This could be a mild, low-grade obstruction that is developing versus ileus. General surgery was consulted and admitted for further management

## 2023-06-14 NOTE — Plan of Care (Signed)
  Problem: Pain Managment: Goal: General experience of comfort will improve Outcome: Progressing   Problem: Safety: Goal: Ability to remain free from injury will improve Outcome: Progressing   

## 2023-06-14 NOTE — Progress Notes (Signed)
   06/14/23 1001  Mobility  Activity Ambulated with assistance in hallway  Level of Assistance Contact guard assist, steadying assist  Assistive Device Front wheel walker  Distance Ambulated (ft) 100 ft  Activity Response Tolerated well  Mobility Referral Yes  $Mobility charge 1 Mobility  Mobility Specialist Start Time (ACUTE ONLY) [phone]  Mobility Specialist Stop Time (ACUTE ONLY) 0955  Mobility Specialist Time Calculation (min) (ACUTE ONLY) 28 min   Mobility Specialist: Progress Note  Pt agreeable to mobility session - received in bed. Pt c/o left lower abdominal discomfort, otherwise asymptomatic throughout. Pt returned to chair with all needs met - call bell within reach. Family present.   Barnie Mort Mobility Specialist Please contact via SecureChat or Rehab office at [phone]

## 2023-06-14 NOTE — TOC Initial Note (Signed)
Transition of Care [facility]) - Initial/Assessment Note    Patient Details  Name: [name] MRN: [id number] Date of Birth: 08-18-1956  Transition of Care [facility]) CM/SW Contact:    Kingsley Plan, RN Phone Number: 06/14/2023, 11:40 AM  Clinical Narrative:                  Spoke to patient and husband at bedside.   Patient from home with husband. Recently had ankle surgery , has walker, cane , knee scooter , and 3 in 1 at home, currently not using and completed OP PT .   PCP Jacalyn Lefevre   TOC will continue to follow patient for disposition needs  Expected Discharge Plan: Home/Self Care Barriers to Discharge: Continued Medical Work up   Patient Goals and CMS Choice Patient states their goals for this hospitalization and ongoing recovery are:: to return to home   Choice offered to / list presented to : NA      Expected Discharge Plan and Services   Discharge Planning Services: CM Consult Post Acute Care Choice: NA Living arrangements for the past 2 months: Single Family Home                 DME Arranged: N/A DME Agency: NA       HH Arranged: NA          Prior Living Arrangements/Services Living arrangements for the past 2 months: Single Family Home Lives with:: Spouse Patient language and need for interpreter reviewed:: Yes Do you feel safe going back to the place where you live?: Yes      Need for Family Participation in Patient Care: Yes (Comment) Care giver support system in place?: Yes (comment) Current home services: DME Criminal Activity/Legal Involvement Pertinent to Current Situation/Hospitalization: No - Comment as needed  Activities of Daily Living Home Assistive Devices/Equipment: Cane (specify quad or straight), Eyeglasses, Raised toilet seat with rails, Walker (specify type), Other (Comment) ADL Screening (condition at time of admission) Patient's cognitive ability adequate to safely complete daily activities?: Yes Is the patient deaf or have  difficulty hearing?: No Does the patient have difficulty seeing, even when wearing glasses/contacts?: No Does the patient have difficulty concentrating, remembering, or making decisions?: No Patient able to express need for assistance with ADLs?: Yes Does the patient have difficulty dressing or bathing?: Yes Independently performs ADLs?: Yes (appropriate for developmental age) Does the patient have difficulty walking or climbing stairs?: No Weakness of Legs: None Weakness of Arms/Hands: None  Permission Sought/Granted   Permission granted to share information with : Yes, Verbal Permission Granted  Share Information with NAME: husband [name]           Emotional Assessment Appearance:: Appears stated age Attitude/Demeanor/Rapport: Engaged Affect (typically observed): Accepting Orientation: : Oriented to Self, Oriented to Place, Oriented to  Time, Oriented to Situation Alcohol / Substance Use: Not Applicable Psych Involvement: No (comment)  Admission diagnosis:  Bowel obstruction (HCC) [K56.609] Patient Active Problem List   Diagnosis Date Noted   Bowel obstruction (HCC) 06/13/2023   S/P lumbar fusion 12/07/2021   Adenoma of liver 10/17/2021   Post-menopause atrophic vaginitis 10/17/2021   Allergic rhinitis 10/17/2021   Allergic rhinitis due to animal (cat) (dog) hair and dander 10/17/2021   Allergy to peanuts 10/17/2021   Atrophic vaginitis 10/17/2021   Epidermoid cyst of skin 10/17/2021   Gastroesophageal reflux disease 10/17/2021   Heart murmur 10/17/2021   Idiopathic urticaria 10/17/2021   Menopausal flushing 10/17/2021   Moderate  persistent asthma, uncomplicated 10/17/2021   Unspecified dyspareunia (CODE) 10/17/2021   Vaginal discharge 10/17/2021   Back pain    Fibromyalgia    Osteoarthritis of carpometacarpal (CMC) joint of thumb 05/20/2021   Neck pain 08/04/2020   Cervico-occipital neuralgia 07/02/2020   S/P hernia repair 04/09/2019   S/P colostomy takedown  10/16/2018   Sigmoid volvulus (HCC) 06/07/2018   Bowel perforation (HCC) 06/07/2018   Anemia 06/07/2018   Hypokalemia 06/07/2018   Hypophosphatemia 06/07/2018   AKI (acute kidney injury) (HCC)    Leukocytosis (leucocytosis) 06/04/2018   Fecal impaction (HCC) 06/03/2018   Enterocolitis 06/03/2018   Asthma 06/03/2018   Chronic pain syndrome 06/03/2018   Trochanteric bursitis of left hip 01/22/2018   Arthritis of carpometacarpal (CMC) joint of left thumb 12/11/2017   Chronic fatigue syndrome 11/28/2017   Pernicious anemia 11/28/2017   Hashimoto's thyroiditis 11/28/2017   Supraventricular tachycardia 11/28/2017   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 [place] [address] Phone: [phone] Fax: [phone]  [facility] [place], [place] - [address] [place] [address] Phone: [phone] Fax: [phone]     Social Determinants of Health (SDOH) Social History: SDOH Screenings   Food Insecurity: No Food Insecurity (06/13/2023)  Housing: Low Risk  (06/13/2023)  Transportation Needs: No Transportation Needs (06/13/2023)  Utilities: Not At Risk (06/13/2023)  Depression (PHQ2-9): Low Risk  (12/12/2022)  Tobacco Use: Low Risk  (06/13/2023)   SDOH Interventions:     Readmission Risk Interventions     No data to display

## 2023-06-14 NOTE — Progress Notes (Signed)
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  Last BM 4 days PTA In the ED: Vitals fairly stable, afebrile Labs showed stable renal function and electrolytes lipase 33 UA negative, EKG NSR CTAB/pelvis>>fluid-filled stomach and proximal small bowel, duodenal and jejunum with some dilatation of the jejunum up to 3.2 cm. There is a somewhat slow transition to decompressed loops in the anterior upper pelvis in the lower abdomen with some surrounding mesenteric stranding and fluid. No pneumatosis. This could be a mild, low-grade obstruction that is developing versus ileus. General surgery was consulted and admitted for further management    Subjective: Patient seen and examined this morning NG tube in place reports she had a small flatus during night but none since.  No bowel movement.  Husband at the bedside Overnight patient has been afebrile, BP stable saturating well on room air Labs are stable electrolytes, mild leukocytosis  Assessment and Plan: Principal Problem:   Bowel obstruction (HCC) Active Problems:   Paroxysmal atrial tachycardia   Hypothyroidism   Gastroesophageal reflux disease   Early SBO versus ileus due to possible gastroenteritis: CT abdomen reviewed, last BM 4 days PTA and having intractable nausea and abdominal pain. Continue current conservative management with NG tube decompression, n.p.o., IV fluids, PPI antiemetics and pain management as needed.  Continue plan per general surgery keep K above 4 mag above 2 mobilize as able for bowel  function.  SVT history: No active issues currently monitor electrolytes  History of asthma: Not in acute exacerbation.  Continue nebs as needed  Hypothyroidism: If unable to use orally will consider IV Synthroid over the weekend  GERD: continue PPI  OSA:  on oxygen at bedtime  DVT prophylaxis: heparin injection 5,000 Units Start: 06/13/23 2200 Code Status:   Code Status: Full Code Family Communication: plan of care discussed with patient/husband at bedside. Patient status is: Inpatient because of bowel obstruction Level of care: Telemetry Medical   Dispo: The patient is from: Home            Anticipated disposition: Pending clinical improvement Objective: Vitals last 24 hrs: Vitals:   06/13/23 2359 06/14/23 0346 06/14/23 0500 06/14/23 0746  BP:  (!) 121/50  (!) 120/53  Pulse:  62  62  Resp:  19  17  Temp:  97.8 F (36.6 C)  97.8 F (36.6 C)  TempSrc:  Oral  Oral  SpO2: 99% 98%  98%  Weight:   73 kg   Height:       Weight change:   Physical Examination: General exam: alert awake, older than stated age HEENT:Oral mucosa moist, Ear/Nose WNL grossly Respiratory system: bilaterally clear BS, no use of accessory muscle Cardiovascular system: S1 & S2 +, No JVD. Gastrointestinal system: Abdomen soft,NT,ND, BS+ absent Nervous System:Alert, awake, moving extremities. Extremities: LE edema neg,distal peripheral pulses palpable.  Skin: No rashes,no icterus. MSK: Normal muscle bulk,tone, power  Medications reviewed:  Scheduled Meds:  heparin  5,000 Units Subcutaneous Q8H   pantoprazole (PROTONIX) IV  40 mg Intravenous Q24H   Continuous Infusions:  sodium chloride 75 mL/hr at 06/14/23 0612   acetaminophen  Diet Order             Diet NPO time specified  Diet effective now                   Intake/Output Summary (Last 24 hours) at 06/14/2023 1218 Last data filed at 06/14/2023 0612 Gross per 24 hour  Intake 634.58 ml  Output 640 ml  Net -5.42 ml   Net  IO Since Admission: -5.42 mL [06/14/23 1218]  Wt Readings from Last 3 Encounters:  06/14/23 73 kg  05/30/23 74.8 kg  01/04/23 78.7 kg     Unresulted Labs (From admission, onward)    None     Data Reviewed: I have personally reviewed following labs and imaging studies CBC: Recent Labs  Lab 06/13/23 1005 06/13/23 1013 06/13/23 1538 06/14/23 0130  WBC 13.9*  --  14.4* 13.0*  NEUTROABS 11.4*  --   --   --   HGB 14.5 15.3* 14.0 13.7  HCT 44.5 45.0 42.8 41.2  MCV 94.1  --  94.1 96.9  PLT 402*  --  383 373   Basic Metabolic Panel: Recent Labs  Lab 06/13/23 1005 06/13/23 1013 06/13/23 1538 06/14/23 0130  NA 141 142 139 139  K 3.8 3.9 4.3 4.2  CL 101 101 105 104  CO2 27  --  26 27  GLUCOSE 125* 122* 91 88  BUN 15 17 12 14   CREATININE 1.07* 1.00 0.90 0.92  CALCIUM 9.7  --  9.3 9.1  MG 2.2  --  2.2  --   PHOS  --   --  3.8  --    GFR: Estimated Creatinine Clearance: 58.6 mL/min (by C-G formula based on SCr of 0.92 mg/dL). Liver Function Tests: Recent Labs  Lab 06/13/23 1005 06/13/23 1538 06/14/23 0130  AST 22 19 17   ALT 16 14 14   ALKPHOS 98 86 83  BILITOT 0.7 0.8 1.1  PROT 7.0 6.7 6.8  ALBUMIN 4.0 3.8 3.7   Recent Labs  Lab 06/13/23 1005  LIPASE 33  HbA1C: Recent Labs    06/13/23 1538  HGBA1C 5.0  Antimicrobials: Anti-infectives (From admission, onward)    None      Culture/Microbiology    Component Value Date/Time   SDES BLOOD BLOOD LEFT HAND 06/03/2018 0930   SPECREQUEST  06/03/2018 0930    BOTTLES DRAWN AEROBIC ONLY Blood Culture adequate volume   CULT  06/03/2018 0930    NO GROWTH 5 DAYS Performed at [facility], [address], [place], [place] [address]    REPTSTATUS 06/08/2018 FINAL 06/03/2018 0930    Radiology Studies: DG Abd Portable 1V-Small Bowel Obstruction Protocol-initial, 8 hr delay  Result Date: 06/14/2023 CLINICAL DATA:  8 hour delayed imaging following administration of oral contrast. Evaluate progression of  enteric contrast material in patient with suspected ileus versus bowel obstruction. EXAM: PORTABLE ABDOMEN - 1 VIEW COMPARISON:  06/13/2023 FINDINGS: Enteric tube is identified with tip in the proximal stomach. The side port for the tube is at the level of the GE junction. Consider further advancing the tube by approximately 3-4 cm. Enteric contrast material is identified is identified predominantly within the colon up to the level of the distal transverse colon. No dilated small bowel loops identified. Right upper quadrant surgical clips. Postsurgical change within the lower lumbar spine. IMPRESSION: 1. No signs of high-grade bowel obstruction identified. 8 hour delayed imaging shows enteric contrast material up to the level of the distal transverse colon without dilated  small bowel loops. 2. Under advanced enteric tube with side port at the level of the GE junction. Consider further advancing by approximately 3-4 cm. Electronically Signed   By: Signa Kell M.D.   On: 06/14/2023 09:37   DG Abd Portable 1V-Small Bowel Protocol-Position Verification  Result Date: 06/13/2023 CLINICAL DATA:  NG tube placement EXAM: PORTABLE ABDOMEN - 1 VIEW COMPARISON:  06/13/2023 CT scan FINDINGS: Nasogastric tube has been placed, its tip and side port of both in the gastric body. Right upper quadrant clips from prior cholecystectomy. The lung bases appear clear. IMPRESSION: 1. NG tube tip and side port in the gastric body. Electronically Signed   By: Gaylyn Rong M.D.   On: 06/13/2023 17:26   CT ABDOMEN PELVIS W CONTRAST  Result Date: 06/13/2023 CLINICAL DATA:  Abdominal pain nonlocalized EXAM: CT ABDOMEN AND PELVIS WITH CONTRAST TECHNIQUE: Multidetector CT imaging of the abdomen and pelvis was performed using the standard protocol following bolus administration of intravenous contrast. RADIATION DOSE REDUCTION: This exam was performed according to the departmental dose-optimization program which includes automated  exposure control, adjustment of the mA and/or kV according to patient size and/or use of iterative reconstruction technique. CONTRAST:  75mL OMNIPAQUE IOHEXOL 350 MG/ML SOLN COMPARISON:  CT without contrast 312 20 FINDINGS: Lower chest: Mild linear opacity lung bases likely scar or atelectasis no pleural effusion. Small air cysts along the left lower lobe on series 5 image 10. There is some bronchiectasis along the lung bases. No pleural effusion 3 mm nodule left lower lobe series 5 image 4 and just above the diaphragm on the left on series 5, image 23. These are unchanged from prior examination demonstrating long-term stability. No additional follow-up. Hepatobiliary: No focal liver abnormality is seen. Status post cholecystectomy. No biliary dilatation. Patent portal vein. Pancreas: Unremarkable. No pancreatic ductal dilatation or surrounding inflammatory changes. Spleen: Normal in size without focal abnormality. Small anterior splenule. Adrenals/Urinary Tract: The adrenal glands are preserved. No enhancing renal mass or collecting system dilatation. The ureters have normal course and caliber extending down to the bladder. Preserved contours of the urinary bladder. Stomach/Bowel: Stomach is distended with fluid. There is also fluid along the duodenal and jejunum with some mildly dilated loops of jejunum. Slow transition to nondistended loops in the central anterior abdomen and pelvis. The ileum is relatively decompressed. There is some fluid and stranding along the mesentery adjacent to these loops there is large amount of stool in the right side of the colon and cecum with the cecum in the central pelvis above the bladder just right of midline. Otherwise the colon more distally has a normal course and caliber. Surgical changes along the rectosigmoid region. The appendix is poorly seen. No pericecal stranding or fluid. Vascular/Lymphatic: Aortic atherosclerosis. No enlarged abdominal or pelvic lymph nodes.  Reproductive: Status post hysterectomy. No adnexal masses. Other: No free intra-air. Musculoskeletal: Degenerative changes of the spine and pelvis. Fixation hardware at L4-5. Associated streak artifact. IMPRESSION: Fluid-filled stomach and proximal small bowel, duodenal and jejunum with some dilatation of the jejunum up to 3.2 cm. There is a somewhat slow transition to decompressed loops in the anterior upper pelvis in the lower abdomen with some surrounding mesenteric stranding and fluid. No pneumatosis. This could be a mild, low-grade obstruction that is developing versus ileus. Please correlate with clinical symptoms and recommend follow-up. No free air. Surgical changes along the rectosigmoid colon. Electronically Signed   By: Karen Kays M.D.   On: 06/13/2023 12:00  LOS: 1 day   Lanae Boast, MD [facility]  06/14/2023, 12:18 PM

## 2023-06-14 NOTE — Progress Notes (Signed)
Subjective: CC: Feels better overall. Still having some crampy abdominal pain, L > R, that required IV pain medication this morning. Also having nausea. NGT output bilious, 640cc/24 hours. Passing some flatus. No BM. Has not been oob.   Objective: Vital signs in last 24 hours: Temp:  [97.2 F (36.2 C)-98.4 F (36.9 C)] 97.8 F (36.6 C) (08/15 0746) Pulse Rate:  [62-67] 62 (08/15 0746) Resp:  [16-20] 17 (08/15 0746) BP: (113-133)/(50-90) 120/53 (08/15 0746) SpO2:  [92 %-99 %] 98 % (08/15 0746) Weight:  [72.6 kg-73 kg] 73 kg (08/15 0500) Last BM Date : 06/09/23  Intake/Output from previous day: 08/14 0701 - 08/15 0700 In: 634.6 [P.O.:60; I.V.:454.6; NG/GT:120] Out: 640 [Emesis/NG output:640] Intake/Output this shift: No intake/output data recorded.  PE: Gen:  Alert, NAD, pleasant Abd: Soft, mild distension, some L > R ttp without rigidity or guarding. Some BS. Well healed laparotomy and prior LLQ colostomy incisions as well and laparoscopic incisions and Pfannenstiel incision. NGT output bilious   Lab Results:  Recent Labs    06/13/23 1538 06/14/23 0130  WBC 14.4* 13.0*  HGB 14.0 13.7  HCT 42.8 41.2  PLT 383 373   BMET Recent Labs    06/13/23 1538 06/14/23 0130  NA 139 139  K 4.3 4.2  CL 105 104  CO2 26 27  GLUCOSE 91 88  BUN 12 14  CREATININE 0.90 0.92  CALCIUM 9.3 9.1   PT/INR No results for input(s): "LABPROT", "INR" in the last 72 hours. CMP     Component Value Date/Time   NA 139 06/14/2023 0130   K 4.2 06/14/2023 0130   CL 104 06/14/2023 0130   CO2 27 06/14/2023 0130   GLUCOSE 88 06/14/2023 0130   BUN 14 06/14/2023 0130   CREATININE 0.92 06/14/2023 0130   CALCIUM 9.1 06/14/2023 0130   PROT 6.8 06/14/2023 0130   ALBUMIN 3.7 06/14/2023 0130   AST 17 06/14/2023 0130   ALT 14 06/14/2023 0130   ALKPHOS 83 06/14/2023 0130   BILITOT 1.1 06/14/2023 0130   GFRNONAA >60 06/14/2023 0130   GFRAA >60 04/10/2019 0602   Lipase     Component  Value Date/Time   LIPASE 33 06/13/2023 1005    Studies/Results: DG Abd Portable 1V-Small Bowel Protocol-Position Verification  Result Date: 06/13/2023 CLINICAL DATA:  NG tube placement EXAM: PORTABLE ABDOMEN - 1 VIEW COMPARISON:  06/13/2023 CT scan FINDINGS: Nasogastric tube has been placed, its tip and side port of both in the gastric body. Right upper quadrant clips from prior cholecystectomy. The lung bases appear clear. IMPRESSION: 1. NG tube tip and side port in the gastric body. Electronically Signed   By: Gaylyn Rong M.D.   On: 06/13/2023 17:26   CT ABDOMEN PELVIS W CONTRAST  Result Date: 06/13/2023 CLINICAL DATA:  Abdominal pain nonlocalized EXAM: CT ABDOMEN AND PELVIS WITH CONTRAST TECHNIQUE: Multidetector CT imaging of the abdomen and pelvis was performed using the standard protocol following bolus administration of intravenous contrast. RADIATION DOSE REDUCTION: This exam was performed according to the departmental dose-optimization program which includes automated exposure control, adjustment of the mA and/or kV according to patient size and/or use of iterative reconstruction technique. CONTRAST:  75mL OMNIPAQUE IOHEXOL 350 MG/ML SOLN COMPARISON:  CT without contrast 312 20 FINDINGS: Lower chest: Mild linear opacity lung bases likely scar or atelectasis no pleural effusion. Small air cysts along the left lower lobe on series 5 image 10. There is some bronchiectasis along the lung  bases. No pleural effusion 3 mm nodule left lower lobe series 5 image 4 and just above the diaphragm on the left on series 5, image 23. These are unchanged from prior examination demonstrating long-term stability. No additional follow-up. Hepatobiliary: No focal liver abnormality is seen. Status post cholecystectomy. No biliary dilatation. Patent portal vein. Pancreas: Unremarkable. No pancreatic ductal dilatation or surrounding inflammatory changes. Spleen: Normal in size without focal abnormality. Small  anterior splenule. Adrenals/Urinary Tract: The adrenal glands are preserved. No enhancing renal mass or collecting system dilatation. The ureters have normal course and caliber extending down to the bladder. Preserved contours of the urinary bladder. Stomach/Bowel: Stomach is distended with fluid. There is also fluid along the duodenal and jejunum with some mildly dilated loops of jejunum. Slow transition to nondistended loops in the central anterior abdomen and pelvis. The ileum is relatively decompressed. There is some fluid and stranding along the mesentery adjacent to these loops there is large amount of stool in the right side of the colon and cecum with the cecum in the central pelvis above the bladder just right of midline. Otherwise the colon more distally has a normal course and caliber. Surgical changes along the rectosigmoid region. The appendix is poorly seen. No pericecal stranding or fluid. Vascular/Lymphatic: Aortic atherosclerosis. No enlarged abdominal or pelvic lymph nodes. Reproductive: Status post hysterectomy. No adnexal masses. Other: No free intra-air. Musculoskeletal: Degenerative changes of the spine and pelvis. Fixation hardware at L4-5. Associated streak artifact. IMPRESSION: Fluid-filled stomach and proximal small bowel, duodenal and jejunum with some dilatation of the jejunum up to 3.2 cm. There is a somewhat slow transition to decompressed loops in the anterior upper pelvis in the lower abdomen with some surrounding mesenteric stranding and fluid. No pneumatosis. This could be a mild, low-grade obstruction that is developing versus ileus. Please correlate with clinical symptoms and recommend follow-up. No free air. Surgical changes along the rectosigmoid colon. Electronically Signed   By: Karen Kays M.D.   On: 06/13/2023 12:00    Anti-infectives: Anti-infectives (From admission, onward)    None        Assessment/Plan SBO vs gastroenteritis - CT scan with a fluid-filled  stomach and proximal small bowel, duodenal and jejunum with some dilatation of the jejunum up to 3.2 cm; there is a somewhat slow transition to decompressed loops in the anterior upper pelvis in the lower abdomen with some surrounding mesenteric stranding and fluid; this could be a mild, low-grade obstruction that is developing versus ileus; no pneumatosis - Afebrile. No tachycardia or systolic hypotension. WBC downtrending slightly at 13. No peritonitis on exam. No indication for acute surgical intervention.  - Contrast in colon on xray and patient with some return of bowel function. Will leave NGT to LIWS today however given ongoing nausea and bilious NGT output. Hopefully can start clamping trial vs remove NGT tomorrow. AM abdominal xray written for.  - Keep K > 4, Mg > 2 and mobilize as able for bowel function - Hopefully patient will improve with conservative management. If patient fails to improve with conservative management, they may require exploratory surgery during admission   FEN - IVF, NPO/NGT to [facility].  VTE - SCDs, subcutaneous heparin. ID - none   Chronic fatigue syndrome Fibromyalgia Asthma Hypothyroidism HLD  I reviewed nursing notes, last 24 h vitals and pain scores, last 48 h intake and output, last 24 h labs and trends, and last 24 h imaging results.    LOS: 1 day    Casimiro Needle  Kirstie Mirza , PA-C [facility] 06/14/2023, 7:55 AM Please see Amion for pager number during day hours 7:00am-4:30pm

## 2023-06-15 ENCOUNTER — Inpatient Hospital Stay (HOSPITAL_COMMUNITY): Payer: Medicare Other

## 2023-06-15 DIAGNOSIS — K56699 Other intestinal obstruction unspecified as to partial versus complete obstruction: Secondary | ICD-10-CM | POA: Diagnosis not present

## 2023-06-15 DIAGNOSIS — E039 Hypothyroidism, unspecified: Secondary | ICD-10-CM | POA: Diagnosis not present

## 2023-06-15 LAB — BASIC METABOLIC PANEL
Anion gap: 14 (ref 5–15)
BUN: 13 mg/dL (ref 8–23)
CO2: 24 mmol/L (ref 22–32)
Calcium: 9.1 mg/dL (ref 8.9–10.3)
Chloride: 100 mmol/L (ref 98–111)
Creatinine, Ser: 0.92 mg/dL (ref 0.44–1.00)
GFR, Estimated: >60 mL/min (ref >60)
Glucose, Bld: 55 mg/dL — ABNORMAL LOW (ref 70–99)
Potassium: 3.8 mmol/L (ref 3.5–5.1)
Sodium: 138 mmol/L (ref 135–145)

## 2023-06-15 LAB — CBC
HCT: 38.3 % (ref 36.0–46.0)
Hemoglobin: 12.9 g/dL (ref 12.0–15.0)
MCH: 32.2 pg (ref 26.0–34.0)
MCHC: 33.7 g/dL (ref 30.0–36.0)
MCV: 95.5 fL (ref 80.0–100.0)
Platelets: 326 10*3/uL (ref 150–400)
RBC: 4.01 MIL/uL (ref 3.87–5.11)
RDW: 11.7 % (ref 11.5–15.5)
WBC: 9.7 10*3/uL (ref 4.0–10.5)
nRBC: 0 % (ref 0.0–0.2)

## 2023-06-15 LAB — MAGNESIUM: Magnesium: 2 mg/dL (ref 1.7–2.4)

## 2023-06-15 MED ORDER — BISACODYL 10 MG RE SUPP
10.0000 mg | Freq: Once | RECTAL | Status: AC
  Administered 2023-06-15: 10 mg via RECTAL
  Filled 2023-06-15: qty 1

## 2023-06-15 MED ORDER — LACTATED RINGERS IV SOLN: INTRAVENOUS | Status: AC

## 2023-06-15 NOTE — Progress Notes (Signed)
Mobility Specialist: Progress Note   06/15/23 1006  Mobility  Activity Ambulated with assistance in hallway  Level of Assistance Contact guard assist, steadying assist  Assistive Device Front wheel walker  Distance Ambulated (ft) 300 ft  Activity Response Tolerated well  Mobility Referral Yes  $Mobility charge 1 Mobility  Mobility Specialist Start Time (ACUTE ONLY) 0946  Mobility Specialist Stop Time (ACUTE ONLY) 1002  Mobility Specialist Time Calculation (min) (ACUTE ONLY) 16 min    Pt was agreeable to mobility session - received in bed. Had c/o mild abdominal discomfort; denies any pain, SOB, or dizziness. Left in bed with all needs met, call bell in reach. Husband in room.   Maurene Capes Mobility Specialist Please contact via SecureChat or Rehab office at [phone]

## 2023-06-15 NOTE — Progress Notes (Signed)
Patient ID: [name], female   DOB: [birth year], 67 y.o.   MRN: [id number] [facility] Surgery Progress Note     Subjective: CC-  Husband at bedside. Patient states that she continues to have similar intermittent left sided abdominal pain, but the nausea is less. Continues to pass small amount of flatus, no BM. Ambulated with mobility tech yesterday. NG with 250cc bilious output over night, 600cc total for the last 24 hours.  Objective: Vital signs in last 24 hours: Temp:  [98 F (36.7 C)-98.6 F (37 C)] 98.2 F (36.8 C) (08/16 0833) Pulse Rate:  [64-74] 74 (08/16 0833) Resp:  [16-18] 16 (08/16 0833) BP: (135-163)/(52-62) 143/52 (08/16 0833) SpO2:  [95 %-98 %] 96 % (08/16 0833) Weight:  [80.4 kg] 80.4 kg (08/16 0537) Last BM Date : 06/09/23  Intake/Output from previous day: 08/15 0701 - 08/16 0700 In: 120 [P.O.:120] Out: 600 [Emesis/NG output:600] Intake/Output this shift: No intake/output data recorded.  PE: Gen:  Alert, NAD, pleasant Abd: Soft, nondistended, TTP LLQ without rigidity or guarding. Few BS heard. Well healed laparotomy and prior LLQ colostomy incisions as well and laparoscopic incisions and Pfannenstiel incision. NGT output bilious   Lab Results:  Recent Labs    06/13/23 1538 06/14/23 0130  WBC 14.4* 13.0*  HGB 14.0 13.7  HCT 42.8 41.2  PLT 383 373   BMET Recent Labs    06/14/23 0130 06/15/23 0326  NA 139 138  K 4.2 3.8  CL 104 100  CO2 27 24  GLUCOSE 88 55*  BUN 14 13  CREATININE 0.92 0.92  CALCIUM 9.1 9.1   PT/INR No results for input(s): "LABPROT", "INR" in the last 72 hours. CMP     Component Value Date/Time   NA 138 06/15/2023 0326   K 3.8 06/15/2023 0326   CL 100 06/15/2023 0326   CO2 24 06/15/2023 0326   GLUCOSE 55 (L) 06/15/2023 0326   BUN 13 06/15/2023 0326   CREATININE 0.92 06/15/2023 0326   CALCIUM 9.1 06/15/2023 0326   PROT 6.8 06/14/2023 0130   ALBUMIN 3.7 06/14/2023 0130   AST 17 06/14/2023 0130   ALT 14  06/14/2023 0130   ALKPHOS 83 06/14/2023 0130   BILITOT 1.1 06/14/2023 0130   GFRNONAA >60 06/15/2023 0326   GFRAA >60 04/10/2019 0602   Lipase     Component Value Date/Time   LIPASE 33 06/13/2023 1005       Studies/Results: DG Abd Portable 1V-Small Bowel Obstruction Protocol-initial, 8 hr delay  Result Date: 06/14/2023 CLINICAL DATA:  8 hour delayed imaging following administration of oral contrast. Evaluate progression of enteric contrast material in patient with suspected ileus versus bowel obstruction. EXAM: PORTABLE ABDOMEN - 1 VIEW COMPARISON:  06/13/2023 FINDINGS: Enteric tube is identified with tip in the proximal stomach. The side port for the tube is at the level of the GE junction. Consider further advancing the tube by approximately 3-4 cm. Enteric contrast material is identified is identified predominantly within the colon up to the level of the distal transverse colon. No dilated small bowel loops identified. Right upper quadrant surgical clips. Postsurgical change within the lower lumbar spine. IMPRESSION: 1. No signs of high-grade bowel obstruction identified. 8 hour delayed imaging shows enteric contrast material up to the level of the distal transverse colon without dilated small bowel loops. 2. Under advanced enteric tube with side port at the level of the GE junction. Consider further advancing by approximately 3-4 cm. Electronically Signed   By: Ladona Ridgel  Bradly Chris M.D.   On: 06/14/2023 09:37   DG Abd Portable 1V-Small Bowel Protocol-Position Verification  Result Date: 06/13/2023 CLINICAL DATA:  NG tube placement EXAM: PORTABLE ABDOMEN - 1 VIEW COMPARISON:  06/13/2023 CT scan FINDINGS: Nasogastric tube has been placed, its tip and side port of both in the gastric body. Right upper quadrant clips from prior cholecystectomy. The lung bases appear clear. IMPRESSION: 1. NG tube tip and side port in the gastric body. Electronically Signed   By: Gaylyn Rong M.D.   On: 06/13/2023  17:26   CT ABDOMEN PELVIS W CONTRAST  Result Date: 06/13/2023 CLINICAL DATA:  Abdominal pain nonlocalized EXAM: CT ABDOMEN AND PELVIS WITH CONTRAST TECHNIQUE: Multidetector CT imaging of the abdomen and pelvis was performed using the standard protocol following bolus administration of intravenous contrast. RADIATION DOSE REDUCTION: This exam was performed according to the departmental dose-optimization program which includes automated exposure control, adjustment of the mA and/or kV according to patient size and/or use of iterative reconstruction technique. CONTRAST:  75mL OMNIPAQUE IOHEXOL 350 MG/ML SOLN COMPARISON:  CT without contrast 312 20 FINDINGS: Lower chest: Mild linear opacity lung bases likely scar or atelectasis no pleural effusion. Small air cysts along the left lower lobe on series 5 image 10. There is some bronchiectasis along the lung bases. No pleural effusion 3 mm nodule left lower lobe series 5 image 4 and just above the diaphragm on the left on series 5, image 23. These are unchanged from prior examination demonstrating long-term stability. No additional follow-up. Hepatobiliary: No focal liver abnormality is seen. Status post cholecystectomy. No biliary dilatation. Patent portal vein. Pancreas: Unremarkable. No pancreatic ductal dilatation or surrounding inflammatory changes. Spleen: Normal in size without focal abnormality. Small anterior splenule. Adrenals/Urinary Tract: The adrenal glands are preserved. No enhancing renal mass or collecting system dilatation. The ureters have normal course and caliber extending down to the bladder. Preserved contours of the urinary bladder. Stomach/Bowel: Stomach is distended with fluid. There is also fluid along the duodenal and jejunum with some mildly dilated loops of jejunum. Slow transition to nondistended loops in the central anterior abdomen and pelvis. The ileum is relatively decompressed. There is some fluid and stranding along the mesentery  adjacent to these loops there is large amount of stool in the right side of the colon and cecum with the cecum in the central pelvis above the bladder just right of midline. Otherwise the colon more distally has a normal course and caliber. Surgical changes along the rectosigmoid region. The appendix is poorly seen. No pericecal stranding or fluid. Vascular/Lymphatic: Aortic atherosclerosis. No enlarged abdominal or pelvic lymph nodes. Reproductive: Status post hysterectomy. No adnexal masses. Other: No free intra-air. Musculoskeletal: Degenerative changes of the spine and pelvis. Fixation hardware at L4-5. Associated streak artifact. IMPRESSION: Fluid-filled stomach and proximal small bowel, duodenal and jejunum with some dilatation of the jejunum up to 3.2 cm. There is a somewhat slow transition to decompressed loops in the anterior upper pelvis in the lower abdomen with some surrounding mesenteric stranding and fluid. No pneumatosis. This could be a mild, low-grade obstruction that is developing versus ileus. Please correlate with clinical symptoms and recommend follow-up. No free air. Surgical changes along the rectosigmoid colon. Electronically Signed   By: Karen Kays M.D.   On: 06/13/2023 12:00    Anti-infectives: Anti-infectives (From admission, onward)    None        Assessment/Plan SBO vs gastroenteritis - Continues to have LLQ pain, but nausea is improved and she  continues to pass a small amount of flatus. Xray report pending this morning, but she does have contrast in her colon and I do not see any significant dilated loops of small intestine. Labs pending. Will try clamping NG this morning and give sips of clear liquids. If patient complains of worsening abdominal pain, nausea, or vomiting then return NG to LIWS. Mobilize. Will also order dulcolax suppository today.   FEN - IVF, clamp NG, sips clear liquids VTE - SCDs, subcutaneous heparin. ID - none   Chronic fatigue  syndrome Fibromyalgia Asthma Hypothyroidism HLD GERD OSA Hx SVT  I reviewed hospitalist notes, last 24 h vitals and pain scores, last 48 h intake and output, last 24 h labs and trends, and last 24 h imaging results.    LOS: 2 days    Franne Forts, [facility] 06/15/2023, 8:33 AM Please see Amion for pager number during day hours 7:00am-4:30pm

## 2023-06-15 NOTE — Progress Notes (Signed)
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  Last BM 4 days PTA Labs showed stable renal function and electrolytes lipase 33 UA negative, EKG NSR CTAB/pelvis>>fluid-filled stomach and proximal small bowel, duodenal and jejunum with some dilatation of the jejunum up to 3.2 cm. There is a somewhat slow transition to decompressed loops in the anterior upper pelvis in the lower abdomen with some surrounding mesenteric stranding and fluid. No pneumatosis. This could be a mild, low-grade obstruction that is developing versus ileus. General surgery was consulted and admitted for further management     Assessment/Plan:   Early SBO versus ileus due to possible gastroenteritis: CT abdomen reviewed, last BM 4 days PTA and having intractable nausea and abdominal pain. Continue current conservative management with NG tube, clamped this morning, n.p.o., IV fluids, PPI antiemetics and pain management as needed.  Continue plan per general surgery keep K above 4 mag above 2 mobilize as able for bowel function. -General Surgery following   SVT history: No active issues currently monitor electrolytes   History of asthma: Not in acute exacerbation.  Continue nebs as needed   Hypothyroidism: If unable to use orally will consider IV Synthroid over the weekend   GERD: continue PPI   OSA:  on oxygen at bedtime    Medications     arformoterol  15 mcg Nebulization BID   budesonide (PULMICORT) nebulizer solution  0.25 mg Nebulization BID   heparin  5,000 Units  Subcutaneous Q8H   ipratropium  0.5 mg Nebulization Q6H   pantoprazole (PROTONIX) IV  40 mg Intravenous Q24H     Data Reviewed:   CBG:  No results for input(s): "GLUCAP" in the last 168 hours.  SpO2: 96 %    Vitals:   06/15/23 0537 06/15/23 0757 06/15/23 0833 06/15/23 1407  BP: (!) 135/54  (!) 143/52   Pulse: 69 71 74   Resp: 16 16 16 15   Temp: 98.6 F (37 C)  98.2 F (36.8 C)   TempSrc: Oral  Oral   SpO2: 98% 98% 96%   Weight: 80.4 kg     Height:          Data Reviewed:  Basic Metabolic Panel: Recent Labs  Lab 06/13/23 1005 06/13/23 1013 06/13/23 1538 06/14/23 0130 06/15/23 0326  NA 141 142 139 139 138  K 3.8 3.9 4.3 4.2 3.8  CL 101 101 105 104 100  CO2 27  --  26 27 24   GLUCOSE 125* 122* 91 88 55*  BUN 15 17 12 14 13   CREATININE 1.07* 1.00 0.90 0.92 0.92  CALCIUM 9.7  --  9.3 9.1 9.1  MG 2.2  --  2.2  --  2.0  PHOS  --   --  3.8  --   --     CBC: Recent Labs  Lab 06/13/23 1005 06/13/23 1013 06/13/23 1538 06/14/23 0130 06/15/23 0818  WBC 13.9*  --  14.4* 13.0* 9.7  NEUTROABS 11.4*  --   --   --   --   HGB 14.5 15.3* 14.0 13.7 12.9  HCT 44.5 45.0 42.8 41.2 38.3  MCV 94.1  --  94.1 96.9 95.5  PLT 402*  --  383 373 326    LFT Recent Labs  Lab 06/13/23 1005 06/13/23 1538 06/14/23 0130  AST 22 19 17   ALT 16 14 14   ALKPHOS 98 86 83  BILITOT 0.7 0.8 1.1  PROT 7.0 6.7 6.8  ALBUMIN 4.0 3.8 3.7     Antibiotics: Anti-infectives (From admission, onward)    None        DVT prophylaxis: Heparin  Code Status: Full code  Family Communication: Discussed with patient husband at bedside   CONSULTS General Surgery   Subjective   NG tube clamped, not passing gas   Objective    Physical Examination:   General-appears in no acute distress Heart-S1-S2, regular, no murmur auscultated Lungs-clear to auscultation bilaterally, no wheezing or crackles auscultated Abdomen-soft, tenderness in left lower quadrant Extremities-no  edema in the lower extremities Neuro-alert, oriented x3, no focal deficit noted  Status is: Inpatient:             Meredeth Ide   [facility] If 7PM-7AM, please contact night-coverage at [email or web address], Office  [phone]   06/15/2023, 6:54 PM  LOS: 2 days

## 2023-06-16 ENCOUNTER — Inpatient Hospital Stay (HOSPITAL_COMMUNITY): Payer: Medicare Other

## 2023-06-16 DIAGNOSIS — I4719 Other supraventricular tachycardia: Secondary | ICD-10-CM | POA: Diagnosis not present

## 2023-06-16 DIAGNOSIS — K56699 Other intestinal obstruction unspecified as to partial versus complete obstruction: Secondary | ICD-10-CM | POA: Diagnosis not present

## 2023-06-16 DIAGNOSIS — E039 Hypothyroidism, unspecified: Secondary | ICD-10-CM | POA: Diagnosis not present

## 2023-06-16 LAB — BASIC METABOLIC PANEL
Anion gap: 14 (ref 5–15)
BUN: 6 mg/dL — ABNORMAL LOW (ref 8–23)
CO2: 23 mmol/L (ref 22–32)
Calcium: 8.6 mg/dL — ABNORMAL LOW (ref 8.9–10.3)
Chloride: 100 mmol/L (ref 98–111)
Creatinine, Ser: 0.88 mg/dL (ref 0.44–1.00)
GFR, Estimated: >60 mL/min (ref >60)
Glucose, Bld: 69 mg/dL — ABNORMAL LOW (ref 70–99)
Potassium: 3.6 mmol/L (ref 3.5–5.1)
Sodium: 137 mmol/L (ref 135–145)

## 2023-06-16 LAB — CBC
HCT: 34.6 % — ABNORMAL LOW (ref 36.0–46.0)
Hemoglobin: 12 g/dL (ref 12.0–15.0)
MCH: 31.7 pg (ref 26.0–34.0)
MCHC: 34.7 g/dL (ref 30.0–36.0)
MCV: 91.3 fL (ref 80.0–100.0)
Platelets: 327 10*3/uL (ref 150–400)
RBC: 3.79 MIL/uL — ABNORMAL LOW (ref 3.87–5.11)
RDW: 11.8 % (ref 11.5–15.5)
WBC: 10.1 10*3/uL (ref 4.0–10.5)
nRBC: 0 % (ref 0.0–0.2)

## 2023-06-16 LAB — MAGNESIUM: Magnesium: 1.8 mg/dL (ref 1.7–2.4)

## 2023-06-16 NOTE — Progress Notes (Signed)
Patient ID: [name], female   DOB: [birth year], 67 y.o.   MRN: [id number] [facility] Progress Note     Subjective: CC-  Reports that she had multiple, small BM and is passing flatus. Denies nausea or abdominal pain this morning. AXR with contrast still in the right colon.  Denies complaints this morning.   Objective: Vital signs in last 24 hours: Temp:  [97.8 F (36.6 C)-98.7 F (37.1 C)] 97.8 F (36.6 C) (08/17 0541) Pulse Rate:  [80-83] 82 (08/17 0541) Resp:  [15-17] 17 (08/17 0541) BP: (147-151)/(65-67) 147/65 (08/17 0541) SpO2:  [94 %-97 %] 95 % (08/17 0700) Last BM Date : 06/15/23  Intake/Output from previous day: 08/16 0701 - 08/17 0700 In: 787.3 [I.V.:787.3] Out: 100 [Emesis/NG output:100] Intake/Output this shift: No intake/output data recorded.  PE: Gen:  Alert, NAD, pleasant Abd: Soft, nondistended, TTP LLQ without rigidity or guarding. Few BS heard. Well healed laparotomy and prior LLQ colostomy incisions as well and laparoscopic incisions and Pfannenstiel incision. NGT output bilious   Lab Results:  Recent Labs    06/15/23 0818 06/16/23 0353  WBC 9.7 10.1  HGB 12.9 12.0  HCT 38.3 34.6*  PLT 326 327   BMET Recent Labs    06/15/23 0326 06/16/23 0353  NA 138 137  K 3.8 3.6  CL 100 100  CO2 24 23  GLUCOSE 55* 69*  BUN 13 6*  CREATININE 0.92 0.88  CALCIUM 9.1 8.6*   PT/INR No results for input(s): "LABPROT", "INR" in the last 72 hours. CMP     Component Value Date/Time   NA 137 06/16/2023 0353   K 3.6 06/16/2023 0353   CL 100 06/16/2023 0353   CO2 23 06/16/2023 0353   GLUCOSE 69 (L) 06/16/2023 0353   BUN 6 (L) 06/16/2023 0353   CREATININE 0.88 06/16/2023 0353   CALCIUM 8.6 (L) 06/16/2023 0353   PROT 6.8 06/14/2023 0130   ALBUMIN 3.7 06/14/2023 0130   AST 17 06/14/2023 0130   ALT 14 06/14/2023 0130   ALKPHOS 83 06/14/2023 0130   BILITOT 1.1 06/14/2023 0130   GFRNONAA >60 06/16/2023 0353   GFRAA >60 04/10/2019 0602    Lipase     Component Value Date/Time   LIPASE 33 06/13/2023 1005       Studies/Results: DG Abd 1 View  Result Date: 06/15/2023 CLINICAL DATA:  Bowel obstruction EXAM: ABDOMEN - 1 VIEW COMPARISON:  06/14/2023 FINDINGS: Nasogastric tube with the tip projecting over the stomach, but the proximal port is above the esophagogastric junction. Recommend advancing the tube 7-8 cm. No bowel dilatation to suggest obstruction. Mild gaseous distension of the colon. Contrast in the ascending colon extending into the hepatic flexure. No evidence of pneumoperitoneum, portal venous gas or pneumatosis. No pathologic calcifications along the expected course of the ureters. No acute osseous abnormality. IMPRESSION: 1. Nasogastric tube with the tip projecting over the stomach, but the proximal port is above the esophagogastric junction. Recommend advancing the tube 7-8 cm. 2. No bowel dilatation to suggest obstruction. Contrast in the ascending colon extending into the hepatic flexure. Electronically Signed   By: Elige Ko M.D.   On: 06/15/2023 11:42    Anti-infectives: Anti-infectives (From admission, onward)    None        Assessment/Plan SBO vs gastroenteritis - Did well with NGT clamped and is passing gas w/o reports of pain - NGT removed. Will trial CLD. ADAT    FEN - CLD, ADAT VTE - SCDs, subcutaneous heparin.  ID - none   Chronic fatigue syndrome Fibromyalgia Asthma Hypothyroidism HLD GERD OSA Hx SVT  I reviewed hospitalist notes, last 24 h vitals and pain scores, last 48 h intake and output, last 24 h labs and trends, and last 24 h imaging results.    LOS: 3 days    Moise Boring, [facility] 06/16/2023, 9:00 AM Please see Amion for pager number during day hours 7:00am-4:30pm

## 2023-06-16 NOTE — Progress Notes (Signed)
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  Last BM 4 days PTA Labs showed stable renal function and electrolytes lipase 33 UA negative, EKG NSR CTAB/pelvis>>fluid-filled stomach and proximal small bowel, duodenal and jejunum with some dilatation of the jejunum up to 3.2 cm. There is a somewhat slow transition to decompressed loops in the anterior upper pelvis in the lower abdomen with some surrounding mesenteric stranding and fluid. No pneumatosis. This could be a mild, low-grade obstruction that is developing versus ileus. General surgery was consulted and admitted for further management     Assessment/Plan:   Early SBO versus ileus due to possible gastroenteritis: CT abdomen reviewed, last BM 4 days PTA and having intractable nausea and abdominal pain. -Treated with NG tube placement, now removed -Passing gas, started on clear liquid diet.  Advance as tolerated -General Surgery following  SVT history: No active issues currently monitor electrolytes   History of asthma: Not in acute exacerbation.  Continue nebs as needed   Hypothyroidism: If unable to use orally will consider IV Synthroid over the weekend   GERD: continue PPI   OSA:  on oxygen at bedtime    Medications     arformoterol  15 mcg Nebulization BID   budesonide (PULMICORT) nebulizer solution  0.25 mg Nebulization BID   heparin  5,000 Units Subcutaneous Q8H   pantoprazole (PROTONIX) IV  40 mg Intravenous Q24H     Data Reviewed:   CBG:  No results for input(s): "GLUCAP"  in the last 168 hours.  SpO2: 100 %    Vitals:   06/16/23 0144 06/16/23 0541 06/16/23 0700 06/16/23 0919  BP:  (!) 147/65  135/69  Pulse: 80 82  88  Resp:  17  20  Temp:  97.8 F (36.6 C)  98 F (36.7 C)  TempSrc:  Oral  Oral  SpO2: 96% 94% 95% 100%  Weight:      Height:          Data Reviewed:  Basic Metabolic Panel: Recent Labs  Lab 06/13/23 1005 06/13/23 1013 06/13/23 1538 06/14/23 0130 06/15/23 0326 06/16/23 0353  NA 141 142 139 139 138 137  K 3.8 3.9 4.3 4.2 3.8 3.6  CL 101 101 105 104 100 100  CO2 27  --  26 27 24 23   GLUCOSE 125* 122* 91 88 55* 69*  BUN 15 17 12 14 13  6*  CREATININE 1.07* 1.00 0.90 0.92 0.92 0.88  CALCIUM 9.7  --  9.3 9.1 9.1 8.6*  MG 2.2  --  2.2  --  2.0 1.8  PHOS  --   --  3.8  --   --   --     CBC: Recent Labs  Lab 06/13/23 1005 06/13/23 1013 06/13/23 1538 06/14/23 0130 06/15/23 0818 06/16/23 0353  WBC 13.9*  --  14.4* 13.0* 9.7 10.1  NEUTROABS 11.4*  --   --   --   --   --   HGB 14.5 15.3* 14.0 13.7 12.9 12.0  HCT 44.5 45.0 42.8 41.2 38.3 34.6*  MCV 94.1  --  94.1 96.9 95.5 91.3  PLT 402*  --  383 373 326 327    LFT Recent Labs  Lab 06/13/23 1005 06/13/23 1538 06/14/23 0130  AST 22 19 17   ALT 16 14 14   ALKPHOS 98 86 83  BILITOT 0.7 0.8 1.1  PROT 7.0 6.7 6.8  ALBUMIN 4.0 3.8 3.7     Antibiotics: Anti-infectives (From admission, onward)    None        DVT prophylaxis: Heparin  Code Status: Full code  Family Communication: Discussed with patient husband at bedside   CONSULTS General Surgery   Subjective   Patient had multiple small BMs, passing gas.  Started on clear liquid diet.  NG tube removed   Objective    Physical Examination:   General-appears in no acute distress Heart-S1-S2, regular, no murmur auscultated Lungs-clear to auscultation bilaterally, no wheezing or crackles auscultated Abdomen-soft, nontender, no organomegaly Extremities-no edema in the lower  extremities Neuro-alert, oriented x3, no focal deficit noted  Status is: Inpatient:             Meredeth Ide   [facility] If 7PM-7AM, please contact night-coverage at [email or web address], Office  [phone]   06/16/2023, 10:42 AM  LOS: 3 days

## 2023-06-16 NOTE — Plan of Care (Signed)

## 2023-06-17 DIAGNOSIS — I4719 Other supraventricular tachycardia: Secondary | ICD-10-CM | POA: Diagnosis not present

## 2023-06-17 DIAGNOSIS — K56699 Other intestinal obstruction unspecified as to partial versus complete obstruction: Secondary | ICD-10-CM | POA: Diagnosis not present

## 2023-06-17 NOTE — Plan of Care (Signed)
  Problem: Education: Goal: Knowledge of General Education information will improve Description: Including pain rating scale, medication(s)/side effects and non-pharmacologic comfort measures 06/17/2023 0748 by Letta Moynahan, RN Outcome: Progressing 06/17/2023 0748 by Letta Moynahan, RN Outcome: Progressing   Problem: Health Behavior/Discharge Planning: Goal: Ability to manage health-related needs will improve 06/17/2023 0748 by Letta Moynahan, RN Outcome: Progressing 06/17/2023 0748 by Letta Moynahan, RN Outcome: Progressing   Problem: Clinical Measurements: Goal: Ability to maintain clinical measurements within normal limits will improve 06/17/2023 0748 by Letta Moynahan, RN Outcome: Progressing 06/17/2023 0748 by Letta Moynahan, RN Outcome: Progressing Goal: Will remain free from infection 06/17/2023 0748 by Letta Moynahan, RN Outcome: Progressing 06/17/2023 0748 by Letta Moynahan, RN Outcome: Progressing Goal: Diagnostic test results will improve 06/17/2023 0748 by Letta Moynahan, RN Outcome: Progressing 06/17/2023 0748 by Letta Moynahan, RN Outcome: Progressing Goal: Respiratory complications will improve 06/17/2023 0748 by Letta Moynahan, RN Outcome: Progressing 06/17/2023 0748 by Letta Moynahan, RN Outcome: Progressing Goal: Cardiovascular complication will be avoided 06/17/2023 0748 by Letta Moynahan, RN Outcome: Progressing 06/17/2023 0748 by Letta Moynahan, RN Outcome: Progressing   Problem: Activity: Goal: Risk for activity intolerance will decrease 06/17/2023 0748 by Letta Moynahan, RN Outcome: Progressing 06/17/2023 0748 by Letta Moynahan, RN Outcome: Progressing   Problem: Nutrition: Goal: Adequate nutrition will be maintained 06/17/2023 0748 by Letta Moynahan, RN Outcome: Progressing 06/17/2023 0748 by Letta Moynahan, RN Outcome: Progressing   Problem: Coping: Goal: Level of anxiety will decrease 06/17/2023 0748 by Letta Moynahan, RN Outcome:  Progressing 06/17/2023 0748 by Letta Moynahan, RN Outcome: Progressing   Problem: Elimination: Goal: Will not experience complications related to bowel motility 06/17/2023 0748 by Letta Moynahan, RN Outcome: Progressing 06/17/2023 0748 by Letta Moynahan, RN Outcome: Progressing Goal: Will not experience complications related to urinary retention 06/17/2023 0748 by Letta Moynahan, RN Outcome: Progressing 06/17/2023 0748 by Letta Moynahan, RN Outcome: Progressing   Problem: Pain Managment: Goal: General experience of comfort will improve 06/17/2023 0748 by Letta Moynahan, RN Outcome: Progressing 06/17/2023 0748 by Letta Moynahan, RN Outcome: Progressing   Problem: Safety: Goal: Ability to remain free from injury will improve 06/17/2023 0748 by Letta Moynahan, RN Outcome: Progressing 06/17/2023 0748 by Letta Moynahan, RN Outcome: Progressing   Problem: Skin Integrity: Goal: Risk for impaired skin integrity will decrease 06/17/2023 0748 by Letta Moynahan, RN Outcome: Progressing 06/17/2023 0748 by Letta Moynahan, RN Outcome: Progressing

## 2023-06-17 NOTE — Progress Notes (Signed)
Patient ID: [name], female   DOB: Dec 13, 1955, 67 y.o.   MRN: [id number] [facility] Surgery Progress Note     Subjective: CC-  Tolerated a CLD without abdominal pain or nausea.  + Flatus. Abdomen soft and NT.  Denies complaints this morning.   Objective: Vital signs in last 24 hours: Temp:  [98.4 F (36.9 C)-98.5 F (36.9 C)] 98.4 F (36.9 C) (08/18 0737) Pulse Rate:  [71-86] 71 (08/18 0737) Resp:  [18-19] 19 (08/18 0737) BP: (123-157)/(51-59) 123/51 (08/18 0737) SpO2:  [95 %-99 %] 97 % (08/18 0857) Weight:  [73.9 kg] 73.9 kg (08/18 0500) Last BM Date : 06/16/23  Intake/Output from previous day: 08/17 0701 - 08/18 0700 In: 600 [P.O.:600] Out: 1 [Stool:1] Intake/Output this shift: No intake/output data recorded.  PE: Gen:  Alert, NAD, pleasant Abd: Soft, nondistended, TTP LLQ without rigidity or guarding. Few BS heard. Well healed laparotomy and prior LLQ colostomy incisions as well and laparoscopic incisions and Pfannenstiel incision. NGT output bilious   Lab Results:  Recent Labs    06/15/23 0818 06/16/23 0353  WBC 9.7 10.1  HGB 12.9 12.0  HCT 38.3 34.6*  PLT 326 327   BMET Recent Labs    06/15/23 0326 06/16/23 0353  NA 138 137  K 3.8 3.6  CL 100 100  CO2 24 23  GLUCOSE 55* 69*  BUN 13 6*  CREATININE 0.92 0.88  CALCIUM 9.1 8.6*   PT/INR No results for input(s): "LABPROT", "INR" in the last 72 hours. CMP     Component Value Date/Time   NA 137 06/16/2023 0353   K 3.6 06/16/2023 0353   CL 100 06/16/2023 0353   CO2 23 06/16/2023 0353   GLUCOSE 69 (L) 06/16/2023 0353   BUN 6 (L) 06/16/2023 0353   CREATININE 0.88 06/16/2023 0353   CALCIUM 8.6 (L) 06/16/2023 0353   PROT 6.8 06/14/2023 0130   ALBUMIN 3.7 06/14/2023 0130   AST 17 06/14/2023 0130   ALT 14 06/14/2023 0130   ALKPHOS 83 06/14/2023 0130   BILITOT 1.1 06/14/2023 0130   GFRNONAA >60 06/16/2023 0353   GFRAA >60 04/10/2019 0602   Lipase     Component Value Date/Time   LIPASE  33 06/13/2023 1005       Studies/Results: DG Abd Portable 1V  Result Date: 06/16/2023 CLINICAL DATA:  Small bowel obstruction EXAM: PORTABLE ABDOMEN - 1 VIEW COMPARISON:  06/15/2023 FINDINGS: Nasogastric tube with the tip projecting over the stomach. Contrast in the ascending colon and hepatic flexure. No bowel dilatation to suggest obstruction. No evidence of pneumoperitoneum, portal venous gas or pneumatosis. No pathologic calcifications along the expected course of the ureters. No acute osseous abnormality. Posterior spinal fusion at L4-5. IMPRESSION: 1. Nonobstructive bowel gas pattern. Electronically Signed   By: Elige Ko M.D.   On: 06/16/2023 10:10    Anti-infectives: Anti-infectives (From admission, onward)    None        Assessment/Plan SBO vs gastroenteritis - Advance to regular diet (ordered) - Can discharge this afternoon vs. Tomorrow morning pending tolerance of regular diet and ongoing bowel function    FEN - Regular VTE - SCDs, subcutaneous heparin. ID - none   Chronic fatigue syndrome Fibromyalgia Asthma Hypothyroidism HLD GERD OSA Hx SVT  I reviewed hospitalist notes, last 24 h vitals and pain scores, last 48 h intake and output, last 24 h labs and trends, and last 24 h imaging results.    LOS: 4 days    Earl Lites  Sampson Goon, [facility] Surgery 06/17/2023, 9:19 AM Please see Amion for pager number during day hours 7:00am-4:30pm

## 2023-06-17 NOTE — Plan of Care (Signed)
Problem: Education: Goal: Knowledge of General Education information will improve Description: Including pain rating scale, medication(s)/side effects and non-pharmacologic comfort measures 06/17/2023 1141 by Letta Moynahan, RN Outcome: Adequate for Discharge 06/17/2023 0748 by Letta Moynahan, RN Outcome: Progressing 06/17/2023 0748 by Letta Moynahan, RN Outcome: Progressing   Problem: Health Behavior/Discharge Planning: Goal: Ability to manage health-related needs will improve 06/17/2023 1141 by Letta Moynahan, RN Outcome: Adequate for Discharge 06/17/2023 0748 by Letta Moynahan, RN Outcome: Progressing 06/17/2023 0748 by Letta Moynahan, RN Outcome: Progressing   Problem: Clinical Measurements: Goal: Ability to maintain clinical measurements within normal limits will improve 06/17/2023 1141 by Letta Moynahan, RN Outcome: Adequate for Discharge 06/17/2023 0748 by Letta Moynahan, RN Outcome: Progressing 06/17/2023 0748 by Letta Moynahan, RN Outcome: Progressing Goal: Will remain free from infection 06/17/2023 1141 by Letta Moynahan, RN Outcome: Adequate for Discharge 06/17/2023 0748 by Letta Moynahan, RN Outcome: Progressing 06/17/2023 0748 by Letta Moynahan, RN Outcome: Progressing Goal: Diagnostic test results will improve 06/17/2023 1141 by Letta Moynahan, RN Outcome: Adequate for Discharge 06/17/2023 0748 by Letta Moynahan, RN Outcome: Progressing 06/17/2023 0748 by Letta Moynahan, RN Outcome: Progressing Goal: Respiratory complications will improve 06/17/2023 1141 by Letta Moynahan, RN Outcome: Adequate for Discharge 06/17/2023 0748 by Letta Moynahan, RN Outcome: Progressing 06/17/2023 0748 by Letta Moynahan, RN Outcome: Progressing Goal: Cardiovascular complication will be avoided 06/17/2023 1141 by Letta Moynahan, RN Outcome: Adequate for Discharge 06/17/2023 0748 by Letta Moynahan, RN Outcome: Progressing 06/17/2023 0748 by Letta Moynahan, RN Outcome: Progressing    Problem: Activity: Goal: Risk for activity intolerance will decrease 06/17/2023 1141 by Letta Moynahan, RN Outcome: Adequate for Discharge 06/17/2023 0748 by Letta Moynahan, RN Outcome: Progressing 06/17/2023 0748 by Letta Moynahan, RN Outcome: Progressing   Problem: Nutrition: Goal: Adequate nutrition will be maintained 06/17/2023 1141 by Letta Moynahan, RN Outcome: Adequate for Discharge 06/17/2023 0748 by Letta Moynahan, RN Outcome: Progressing 06/17/2023 0748 by Letta Moynahan, RN Outcome: Progressing   Problem: Coping: Goal: Level of anxiety will decrease 06/17/2023 1141 by Letta Moynahan, RN Outcome: Adequate for Discharge 06/17/2023 0748 by Letta Moynahan, RN Outcome: Progressing 06/17/2023 0748 by Letta Moynahan, RN Outcome: Progressing   Problem: Elimination: Goal: Will not experience complications related to bowel motility 06/17/2023 1141 by Letta Moynahan, RN Outcome: Adequate for Discharge 06/17/2023 0748 by Letta Moynahan, RN Outcome: Progressing 06/17/2023 0748 by Letta Moynahan, RN Outcome: Progressing Goal: Will not experience complications related to urinary retention 06/17/2023 1141 by Letta Moynahan, RN Outcome: Adequate for Discharge 06/17/2023 0748 by Letta Moynahan, RN Outcome: Progressing 06/17/2023 0748 by Letta Moynahan, RN Outcome: Progressing   Problem: Pain Managment: Goal: General experience of comfort will improve 06/17/2023 1141 by Letta Moynahan, RN Outcome: Adequate for Discharge 06/17/2023 0748 by Letta Moynahan, RN Outcome: Progressing 06/17/2023 0748 by Letta Moynahan, RN Outcome: Progressing   Problem: Safety: Goal: Ability to remain free from injury will improve 06/17/2023 1141 by Letta Moynahan, RN Outcome: Adequate for Discharge 06/17/2023 0748 by Letta Moynahan, RN Outcome: Progressing 06/17/2023 0748 by Letta Moynahan, RN Outcome: Progressing   Problem: Skin Integrity: Goal: Risk for impaired skin integrity will decrease 06/17/2023  1141 by Letta Moynahan, RN Outcome: Adequate for Discharge 06/17/2023 0748 by Letta Moynahan, RN Outcome: Progressing 06/17/2023 0748 by Letta Moynahan, RN  Outcome: Progressing

## 2023-06-17 NOTE — Discharge Summary (Signed)
Physician Discharge Summary   Patient: [name] MRN: [id number] DOB: 1955-12-12  Admit date:     06/13/2023  Discharge date: 06/17/23  Discharge Physician: Meredeth Ide   PCP: Venita Sheffield, MD   Recommendations at discharge:   Follow-up PCP in one week  Discharge Diagnoses: Principal Problem:   Bowel obstruction (HCC) Active Problems:   Paroxysmal atrial tachycardia   Hypothyroidism   Gastroesophageal reflux disease  Resolved Problems:   * No resolved hospital problems. 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  Last BM 4 days PTA In the ED: Vitals fairly stable, afebrile Labs showed stable renal function and electrolytes lipase 33 UA negative, EKG NSR CTAB/pelvis>>fluid-filled stomach and proximal small bowel, duodenal and jejunum with some dilatation of the jejunum up to 3.2 cm. There is a somewhat slow transition to decompressed loops in the anterior upper pelvis in the lower abdomen with some surrounding mesenteric stranding and fluid. No pneumatosis. This could be a mild, low-grade obstruction that is developing versus ileus. General surgery was consulted and admitted for further management  Assessment and Plan:  Early SBO versus ileus due to possible gastroenteritis: CT abdomen reviewed, last BM 4 days PTA and having intractable nausea and abdominal pain. -Treated with NG tube placement, now removed -Passing gas, had BM -Tolerated regular diet -General surgery has cleared for discharge -Will discharge home   SVT history: No active issues currently monitor electrolytes   History of asthma: Not in acute exacerbation.  Continue nebs as  needed   Hypothyroidism: Continue home regimen   GERD: continue PPI   OSA:  on oxygen at bedtime        Consultants: General Surgery Procedures performed:  Disposition: Home Diet recommendation:  Discharge Diet Orders (From admission, onward)     Start     Ordered   06/17/23 0000  Diet - low sodium heart healthy        06/17/23 1134           Regular diet DISCHARGE MEDICATION: Allergies as of 06/17/2023       Reactions   Asa [aspirin] Anaphylaxis   325mg  or higher Pt ok with 81mg  daily dose   Bee Venom Shortness Of Breath, Itching, Swelling, Hives   Peanut-containing Drug Products Anaphylaxis, Other (See Comments)   Ambien [zolpidem Tartrate] Other (See Comments)   Hallucinations   Tape Itching, Swelling, Rash   Cipro [ciprofloxacin Hcl] Nausea And Vomiting   Latex Rash   Minocycline Rash   Shellfish Allergy Rash        Medication List     TAKE these medications    acetaminophen 500 MG tablet Commonly known as: TYLENOL Take 2 tablets (1,000 mg total) by mouth every 6 (six) hours as needed.   albuterol 108 (90 Base) MCG/ACT inhaler Commonly known as: VENTOLIN HFA Inhale 2 puffs into the lungs every 4 (four) hours as needed for wheezing or shortness of breath.   ARIPiprazole 2 MG tablet Commonly known as: ABILIFY TAKE 1 TABLET BY MOUTH DAILY What changed: when to take this   aspirin EC 81 MG tablet Take 81 mg by mouth at bedtime.   atorvastatin 10 MG tablet Commonly known as: LIPITOR TAKE 1  TABLET BY MOUTH DAILY   buPROPion 150 MG 24 hr tablet Commonly known as: WELLBUTRIN XL Take 3 tablets (450 mg total) by mouth daily.   cyanocobalamin 1000 MCG/ML injection Commonly known as: VITAMIN B12 Inject 1 mL (1,000 mcg total) into the muscle every 30 (thirty) days.   docusate sodium 100 MG capsule Commonly known as: Colace Take 1 capsule (100 mg total) by mouth 2 (two) times daily. While taking narcotic pain medicine.   doxycycline 20 MG  tablet Commonly known as: PERIOSTAT Take 20 mg by mouth See admin instructions. 20 mg twice daily for 7 days as needed for rosacea flares.   EPINEPHrine 0.3 mg/0.3 mL Soaj injection Commonly known as: EPI-PEN Inject 0.3 mg into the muscle as needed for anaphylaxis.   fexofenadine 180 MG tablet Commonly known as: ALLEGRA Take 180 mg by mouth daily.   ipratropium 0.06 % nasal spray Commonly known as: ATROVENT Place 2 sprays into both nostrils 2 (two) times daily as needed for rhinitis.   ketotifen 0.025 % ophthalmic solution Commonly known as: ZADITOR Place 1 drop into both eyes daily.   levothyroxine 175 MCG tablet Commonly known as: SYNTHROID Take 175 mcg by mouth daily before breakfast.   metoprolol succinate 25 MG 24 hr tablet Commonly known as: TOPROL-XL TAKE 1 TABLET BY MOUTH ONCE  DAILY   naproxen sodium 220 MG tablet Commonly known as: ALEVE Take 220 mg by mouth daily as needed (general pain, headache).   nitroGLYCERIN 0.4 MG SL tablet Commonly known as: NITROSTAT Place 1 tablet (0.4 mg total) under the tongue every 5 (five) minutes as needed for chest pain.   pantoprazole 40 MG tablet Commonly known as: PROTONIX Take 40 mg by mouth 2 (two) times daily.   polyethylene glycol 17 g packet Commonly known as: MIRALAX / GLYCOLAX Take 17 g by mouth daily as needed (constipation).   pramipexole 0.125 MG tablet Commonly known as: MIRAPEX Take 3 tablets (0.375 mg total) by mouth at bedtime as needed (restless legs syndrome).   PROBIOTIC PO Take 1 capsule by mouth daily.   Trelegy Ellipta 200-62.5-25 MCG/ACT Aepb Generic drug: Fluticasone-Umeclidin-Vilant Inhale 1 puff into the lungs at bedtime.   VITAMIN C PO Take 1 tablet by mouth daily.   VITAMIN D-3 PO Take 1 capsule by mouth daily.   Xolair 150 MG injection Generic drug: omalizumab 300 mg every 30 (thirty) days. Every 29th or 30th        Discharge Exam: Filed Weights   06/14/23 0500 06/15/23  0537 06/17/23 0500  Weight: 73 kg 80.4 kg 73.9 kg   General-appears in no acute distress Heart-S1-S2, regular, no murmur auscultated Lungs-clear to auscultation bilaterally, no wheezing or crackles auscultated Abdomen-soft, nontender, no organomegaly Extremities-no edema in the lower extremities Neuro-alert, oriented x3, no focal deficit noted  Condition at discharge: good  The results of significant diagnostics from this hospitalization (including imaging, microbiology, ancillary and laboratory) are listed below for reference.   Imaging Studies: DG Abd Portable 1V  Result Date: 06/16/2023 CLINICAL DATA:  Small bowel obstruction EXAM: PORTABLE ABDOMEN - 1 VIEW COMPARISON:  06/15/2023 FINDINGS: Nasogastric tube with the tip projecting over the stomach. Contrast in the ascending colon and hepatic flexure. No bowel dilatation to suggest obstruction. No evidence of pneumoperitoneum, portal venous gas or pneumatosis. No pathologic calcifications along the expected course of the ureters. No acute osseous abnormality. Posterior spinal fusion at L4-5. IMPRESSION: 1. Nonobstructive bowel gas pattern. Electronically Signed   By: Dillard Cannon.D.  On: 06/16/2023 10:10   DG Abd 1 View  Result Date: 06/15/2023 CLINICAL DATA:  Bowel obstruction EXAM: ABDOMEN - 1 VIEW COMPARISON:  06/14/2023 FINDINGS: Nasogastric tube with the tip projecting over the stomach, but the proximal port is above the esophagogastric junction. Recommend advancing the tube 7-8 cm. No bowel dilatation to suggest obstruction. Mild gaseous distension of the colon. Contrast in the ascending colon extending into the hepatic flexure. No evidence of pneumoperitoneum, portal venous gas or pneumatosis. No pathologic calcifications along the expected course of the ureters. No acute osseous abnormality. IMPRESSION: 1. Nasogastric tube with the tip projecting over the stomach, but the proximal port is above the esophagogastric junction.  Recommend advancing the tube 7-8 cm. 2. No bowel dilatation to suggest obstruction. Contrast in the ascending colon extending into the hepatic flexure. Electronically Signed   By: Elige Ko M.D.   On: 06/15/2023 11:42   DG Abd Portable 1V-Small Bowel Obstruction Protocol-initial, 8 hr delay  Result Date: 06/14/2023 CLINICAL DATA:  8 hour delayed imaging following administration of oral contrast. Evaluate progression of enteric contrast material in patient with suspected ileus versus bowel obstruction. EXAM: PORTABLE ABDOMEN - 1 VIEW COMPARISON:  06/13/2023 FINDINGS: Enteric tube is identified with tip in the proximal stomach. The side port for the tube is at the level of the GE junction. Consider further advancing the tube by approximately 3-4 cm. Enteric contrast material is identified is identified predominantly within the colon up to the level of the distal transverse colon. No dilated small bowel loops identified. Right upper quadrant surgical clips. Postsurgical change within the lower lumbar spine. IMPRESSION: 1. No signs of high-grade bowel obstruction identified. 8 hour delayed imaging shows enteric contrast material up to the level of the distal transverse colon without dilated small bowel loops. 2. Under advanced enteric tube with side port at the level of the GE junction. Consider further advancing by approximately 3-4 cm. Electronically Signed   By: Signa Kell M.D.   On: 06/14/2023 09:37   DG Abd Portable 1V-Small Bowel Protocol-Position Verification  Result Date: 06/13/2023 CLINICAL DATA:  NG tube placement EXAM: PORTABLE ABDOMEN - 1 VIEW COMPARISON:  06/13/2023 CT scan FINDINGS: Nasogastric tube has been placed, its tip and side port of both in the gastric body. Right upper quadrant clips from prior cholecystectomy. The lung bases appear clear. IMPRESSION: 1. NG tube tip and side port in the gastric body. Electronically Signed   By: Gaylyn Rong M.D.   On: 06/13/2023 17:26   CT  ABDOMEN PELVIS W CONTRAST  Result Date: 06/13/2023 CLINICAL DATA:  Abdominal pain nonlocalized EXAM: CT ABDOMEN AND PELVIS WITH CONTRAST TECHNIQUE: Multidetector CT imaging of the abdomen and pelvis was performed using the standard protocol following bolus administration of intravenous contrast. RADIATION DOSE REDUCTION: This exam was performed according to the departmental dose-optimization program which includes automated exposure control, adjustment of the mA and/or kV according to patient size and/or use of iterative reconstruction technique. CONTRAST:  75mL OMNIPAQUE IOHEXOL 350 MG/ML SOLN COMPARISON:  CT without contrast 312 20 FINDINGS: Lower chest: Mild linear opacity lung bases likely scar or atelectasis no pleural effusion. Small air cysts along the left lower lobe on series 5 image 10. There is some bronchiectasis along the lung bases. No pleural effusion 3 mm nodule left lower lobe series 5 image 4 and just above the diaphragm on the left on series 5, image 23. These are unchanged from prior examination demonstrating long-term stability. No additional follow-up. Hepatobiliary: No  focal liver abnormality is seen. Status post cholecystectomy. No biliary dilatation. Patent portal vein. Pancreas: Unremarkable. No pancreatic ductal dilatation or surrounding inflammatory changes. Spleen: Normal in size without focal abnormality. Small anterior splenule. Adrenals/Urinary Tract: The adrenal glands are preserved. No enhancing renal mass or collecting system dilatation. The ureters have normal course and caliber extending down to the bladder. Preserved contours of the urinary bladder. Stomach/Bowel: Stomach is distended with fluid. There is also fluid along the duodenal and jejunum with some mildly dilated loops of jejunum. Slow transition to nondistended loops in the central anterior abdomen and pelvis. The ileum is relatively decompressed. There is some fluid and stranding along the mesentery adjacent to these  loops there is large amount of stool in the right side of the colon and cecum with the cecum in the central pelvis above the bladder just right of midline. Otherwise the colon more distally has a normal course and caliber. Surgical changes along the rectosigmoid region. The appendix is poorly seen. No pericecal stranding or fluid. Vascular/Lymphatic: Aortic atherosclerosis. No enlarged abdominal or pelvic lymph nodes. Reproductive: Status post hysterectomy. No adnexal masses. Other: No free intra-air. Musculoskeletal: Degenerative changes of the spine and pelvis. Fixation hardware at L4-5. Associated streak artifact. IMPRESSION: Fluid-filled stomach and proximal small bowel, duodenal and jejunum with some dilatation of the jejunum up to 3.2 cm. There is a somewhat slow transition to decompressed loops in the anterior upper pelvis in the lower abdomen with some surrounding mesenteric stranding and fluid. No pneumatosis. This could be a mild, low-grade obstruction that is developing versus ileus. Please correlate with clinical symptoms and recommend follow-up. No free air. Surgical changes along the rectosigmoid colon. Electronically Signed   By: Karen Kays M.D.   On: 06/13/2023 12:00    Microbiology: Results for orders placed or performed during the hospital encounter of 12/07/21  SARS CORONAVIRUS 2 (TAT 6-24 HRS) Nasopharyngeal Nasopharyngeal Swab     Status: None   Collection Time: 12/05/21 12:04 PM   Specimen: Nasopharyngeal Swab  Result Value Ref Range Status   SARS Coronavirus 2 NEGATIVE NEGATIVE Final    Comment: (NOTE) SARS-CoV-2 target nucleic acids are NOT DETECTED.  The SARS-CoV-2 RNA is generally detectable in upper and lower respiratory specimens during the acute phase of infection. Negative results do not preclude SARS-CoV-2 infection, do not rule out co-infections with other pathogens, and should not be used as the sole basis for treatment or other patient management  decisions. Negative results must be combined with clinical observations, patient history, and epidemiological information. The expected result is Negative.  Fact Sheet for Patients: HairSlick.no  Fact Sheet for Healthcare Providers: quierodirigir.com  This test is not yet approved or cleared by the [place] FDA and  has been authorized for detection and/or diagnosis of SARS-CoV-2 by FDA under an Emergency Use Authorization (EUA). This EUA will remain  in effect (meaning this test can be used) for the duration of the COVID-19 declaration under Se ction 564(b)(1) of the Act, 21 U.S.C. section 360bbb-3(b)(1), unless the authorization is terminated or revoked sooner.  Performed at [facility], [address], [place], [place] [address]     Labs: CBC: Recent Labs  Lab 06/13/23 1005 06/13/23 1013 06/13/23 1538 06/14/23 0130 06/15/23 0818 06/16/23 0353  WBC 13.9*  --  14.4* 13.0* 9.7 10.1  NEUTROABS 11.4*  --   --   --   --   --   HGB 14.5 15.3* 14.0 13.7 12.9 12.0  HCT  44.5 45.0 42.8 41.2 38.3 34.6*  MCV 94.1  --  94.1 96.9 95.5 91.3  PLT 402*  --  383 373 326 327   Basic Metabolic Panel: Recent Labs  Lab 06/13/23 1005 06/13/23 1013 06/13/23 1538 06/14/23 0130 06/15/23 0326 06/16/23 0353  NA 141 142 139 139 138 137  K 3.8 3.9 4.3 4.2 3.8 3.6  CL 101 101 105 104 100 100  CO2 27  --  26 27 24 23   GLUCOSE 125* 122* 91 88 55* 69*  BUN 15 17 12 14 13  6*  CREATININE 1.07* 1.00 0.90 0.92 0.92 0.88  CALCIUM 9.7  --  9.3 9.1 9.1 8.6*  MG 2.2  --  2.2  --  2.0 1.8  PHOS  --   --  3.8  --   --   --    Liver Function Tests: Recent Labs  Lab 06/13/23 1005 06/13/23 1538 06/14/23 0130  AST 22 19 17   ALT 16 14 14   ALKPHOS 98 86 83  BILITOT 0.7 0.8 1.1  PROT 7.0 6.7 6.8  ALBUMIN 4.0 3.8 3.7   CBG: No results for input(s): "GLUCAP" in the last 168 hours.  Discharge time spent: greater than 30  minutes.  Signed: Meredeth Ide, MD [facility] 06/17/2023

## 2023-06-17 NOTE — Plan of Care (Signed)

## 2023-06-18 ENCOUNTER — Telehealth: Payer: Self-pay | Admitting: *Deleted

## 2023-06-18 NOTE — Transitions of Care (Post Inpatient/ED Visit) (Signed)
06/18/2023  Name: [name] MRN: [id number] DOB: May 27, 1956  Today's TOC FU Call Status: Today's TOC FU Call Status:: Successful TOC FU Call Completed TOC FU Call Complete Date: 06/18/23  Transition Care Management Follow-up Telephone Call Date of Discharge: 06/17/23 Discharge Facility: [facility]) Type of Discharge: Inpatient Admission Primary Inpatient Discharge Diagnosis:: Bowel Obstruction How have you been since you were released from the hospital?: Better Any questions or concerns?: No  Items Reviewed: Did you receive and understand the discharge instructions provided?: Yes Medications obtained,verified, and reconciled?: Yes (Medications Reviewed) Any new allergies since your discharge?: No Dietary orders reviewed?: No Do you have support at home?: Yes People in Home: spouse Name of Support/Comfort Primary Source: [name]  Medications Reviewed Today: Medications Reviewed Today     Reviewed by Luella Cook, RN (Case Manager) on 06/18/23 at 1630  Med List Status: <None>   Medication Order Taking? Sig Documenting Provider Last Dose Status Informant  acetaminophen (TYLENOL) 500 MG tablet [id number] Yes Take 2 tablets (1,000 mg total) by mouth every 6 (six) hours as needed. Barnetta Chapel, PA-C Taking Active Self, Pharmacy Records  albuterol (PROVENTIL HFA;VENTOLIN HFA) 108 (90 Base) MCG/ACT inhaler [id number] Yes Inhale 2 puffs into the lungs every 4 (four) hours as needed for wheezing or shortness of breath. [provider] Taking Active Self, Pharmacy Records  ARIPiprazole (ABILIFY) 2 MG tablet [id number] Yes TAKE 1 TABLET BY MOUTH DAILY  Patient taking differently: Take 2 mg by mouth at bedtime.   [name], MD Taking Active Self, Pharmacy Records  Ascorbic Acid (VITAMIN C PO) [id number] Yes Take 1 tablet by mouth daily. [provider] Taking Active Self, Pharmacy Records  aspirin EC 81 MG tablet [id number] Yes Take 81 mg by mouth at  bedtime. [provider] Taking Active Self, Pharmacy Records  atorvastatin (LIPITOR) 10 MG tablet [id number] Yes TAKE 1 TABLET BY MOUTH DAILY  Patient taking differently: Take 10 mg by mouth at bedtime.   Jake Bathe, MD Taking Active Self, Pharmacy Records  buPROPion (WELLBUTRIN XL) 150 MG 24 hr tablet [id number] Yes Take 3 tablets (450 mg total) by mouth daily. [name], MD Taking Active Self, Pharmacy Records  Cholecalciferol (VITAMIN D-3 PO) [id number] Yes Take 1 capsule by mouth daily. [provider] Taking Active Self, Pharmacy Records  cyanocobalamin (VITAMIN B12) 1000 MCG/ML injection [id number] Yes Inject 1 mL (1,000 mcg total) into the muscle every 30 (thirty) days. [name], MD Taking Active Self, Pharmacy Records           Med Note (COFFELL, Marzella Schlein   Wed Jun 13, 2023  6:07 PM) 1st of the month  docusate sodium (COLACE) 100 MG capsule [id number] Yes Take 1 capsule (100 mg total) by mouth 2 (two) times daily. While taking narcotic pain medicine. Jacinta Shoe, PA-C Taking Active Self, Pharmacy Records  doxycycline (PERIOSTAT) 20 MG tablet [id number] Yes Take 20 mg by mouth See admin instructions. 20 mg twice daily for 7 days as needed for rosacea flares. [provider] Taking Active Self, Pharmacy Records  EPINEPHrine 0.3 mg/0.3 mL IJ SOAJ injection [id number] Yes Inject 0.3 mg into the muscle as needed for anaphylaxis. [provider] Taking Active Self, Pharmacy Records  fexofenadine [facility]) 180 MG tablet [id number] Yes Take 180 mg by mouth daily. [provider] Taking Active Self, Pharmacy Records  ipratropium (ATROVENT) 0.06 % nasal spray [id number] Yes Place 2 sprays into both nostrils 2 (two) times daily  as needed for rhinitis. [provider] Taking Active Self, Pharmacy Records  ketotifen (ZADITOR) 0.025 % ophthalmic solution [id number] Yes Place 1 drop into both eyes daily. [provider] Taking  Active Self, Pharmacy Records  levothyroxine (SYNTHROID) 175 MCG tablet [id number] Yes Take 175 mcg by mouth daily before breakfast. [provider] Taking Active Self, Pharmacy Records  metoprolol succinate (TOPROL-XL) 25 MG 24 hr tablet [id number] Yes TAKE 1 TABLET BY MOUTH ONCE  DAILY Jake Bathe, MD Taking Active Self, Pharmacy Records  naproxen sodium (ALEVE) 220 MG tablet [id number] Yes Take 220 mg by mouth daily as needed (general pain, headache). [provider] Taking Active Self, Pharmacy Records  nitroGLYCERIN (NITROSTAT) 0.4 MG SL tablet [id number] Yes Place 1 tablet (0.4 mg total) under the tongue every 5 (five) minutes as needed for chest pain. Orbie Pyo, MD Taking Active Self, Pharmacy Records  omalizumab [facility]) 150 MG injection [id number] Yes 300 mg every 30 (thirty) days. Every 29th or 30th [provider] Taking Active Self, Pharmacy Records  pantoprazole (PROTONIX) 40 MG tablet [id number] Yes Take 40 mg by mouth 2 (two) times daily. [provider] Taking Active Self, Pharmacy Records  polyethylene glycol (MIRALAX / GLYCOLAX) 17 g packet [id number] Yes Take 17 g by mouth daily as needed (constipation). [provider] Taking Active Self, Pharmacy Records  pramipexole (MIRAPEX) 0.125 MG tablet [id number] Yes Take 3 tablets (0.375 mg total) by mouth at bedtime as needed (restless legs syndrome). [name], MD Taking Active Self, Pharmacy Records  Probiotic Product (PROBIOTIC PO) [id number] Yes Take 1 capsule by mouth daily. [provider] Taking Active Self, Pharmacy Records  [facility] ELLIPTA 200-62.5-25 MCG/ACT AEPB [id number] Yes Inhale 1 puff into the lungs at bedtime. [provider] Taking Active Self, Pharmacy Records            Home Care and Equipment/Supplies: Were Home Health Services Ordered?: NA Any new equipment or medical supplies ordered?: NA  Functional Questionnaire: Do you need assistance  with bathing/showering or dressing?: Yes Do you need assistance with meal preparation?: Yes Do you need assistance with eating?: No Do you have difficulty maintaining continence: No Do you need assistance with getting out of bed/getting out of a chair/moving?: No Do you have difficulty managing or taking your medications?: No  Follow up appointments reviewed: PCP Follow-up appointment confirmed?: Yes Date of PCP follow-up appointment?: 06/26/23 Follow-up Provider: [facility] Follow-up appointment confirmed?: NA Do you need transportation to your follow-up appointment?: No Do you understand care options if your condition(s) worsen?: Yes-patient verbalized understanding  SDOH Interventions Today    Flowsheet Row Most Recent Value  SDOH Interventions   Food Insecurity Interventions Intervention Not Indicated  Housing Interventions Intervention Not Indicated  Transportation Interventions Intervention Not Indicated, Patient Resources (Friends/Family)      Interventions Today    Flowsheet Row Most Recent Value  General Interventions   General Interventions Discussed/Reviewed General Interventions Discussed, General Interventions Reviewed, Doctor Visits  Pharmacy Interventions   Pharmacy Dicussed/Reviewed Pharmacy Topics Discussed      TOC Interventions Today    Flowsheet Row Most Recent Value  TOC Interventions   TOC Interventions Discussed/Reviewed TOC Interventions Discussed, TOC Interventions Reviewed       Gean Maidens BSN RN [facility] [phone]

## 2023-06-26 ENCOUNTER — Ambulatory Visit (INDEPENDENT_AMBULATORY_CARE_PROVIDER_SITE_OTHER): Payer: Medicare Other | Admitting: Sports Medicine

## 2023-06-26 ENCOUNTER — Encounter: Payer: Self-pay | Admitting: Sports Medicine

## 2023-06-26 VITALS — BP 130/70 | HR 67 | Temp 97.8°F | Resp 16 | Ht 65.0 in | Wt 160.0 lb

## 2023-06-26 DIAGNOSIS — K56609 Unspecified intestinal obstruction, unspecified as to partial versus complete obstruction: Secondary | ICD-10-CM

## 2023-06-26 DIAGNOSIS — Z23 Encounter for immunization: Secondary | ICD-10-CM | POA: Diagnosis not present

## 2023-06-26 NOTE — Addendum Note (Signed)
Addended by: Rudi Heap on: 06/26/2023 03:27 PM   Modules accepted: Orders

## 2023-06-26 NOTE — Progress Notes (Signed)
Careteam: Patient Care Team: Venita Sheffield, MD as PCP - General (Internal Medicine) Jake Bathe, MD as PCP - Cardiology (Cardiology) Glyn Ade, PA-C as Physician Assistant (Dermatology) Eileen Stanford, MD as Referring Physician (Allergy and Immunology) Bernette Redbird, MD as Consulting Physician (Gastroenterology) Axel Filler, MD as Consulting Physician (General Surgery) Venita Lick, MD as Consulting Physician (Orthopedic Surgery) Inis Sizer, MD (Internal Medicine) Regal, Kirstie Peri, DPM as Consulting Physician (Podiatry) Dorisann Frames, MD as Referring Physician (Endocrinology) Essie Hart, MD (Inactive) as Referring Physician (Obstetrics and Gynecology) Golda Acre, OD (Optometry) Barron Alvine, MD (Inactive) (Urology) Krystal Clark, MD as Referring Physician (Rheumatology)  PLACE OF SERVICE:  [facility]  Advanced Directive information    Allergies  Allergen Reactions   Asa [Aspirin] Anaphylaxis    325mg  or higher Pt ok with 81mg  daily dose   Bee Venom Shortness Of Breath, Itching, Swelling and Hives   Peanut-Containing Drug Products Anaphylaxis and Other (See Comments)   Ambien [Zolpidem Tartrate] Other (See Comments)    Hallucinations   Tape Itching, Swelling and Rash   Cipro [Ciprofloxacin Hcl] Nausea And Vomiting   Latex Rash   Minocycline Rash   Shellfish Allergy Rash    Chief Complaint  Patient presents with   Transitions Of Care    Patient is being seen for a Transition of care , blockage     HPI: Patient is a 67 y.o. female is here for  HFU  Pt was hospitalized for SBO   States she is feeling better a Going  back to her part  time job  Denies abdominal pain, nausea, vomiting,  Tolerating PO diet  Denies fevers, chills, runny nose, sore throat , cough , chest pain, palpitations, dysuria, diarrhea, loose or dark stools.   Hospitalized from  Admit date:     06/13/2023  Discharge date: 06/17/23  Discharge  Diagnoses: Principal Problem:   Bowel obstruction (HCC) Active Problems:   Paroxysmal atrial tachycardia   Hypothyroidism   Gastroesophageal reflux disease Hospital Course:   67 y.o.f w/ hx of SVT, asthma, hypothyroidism,GERD,OSA , history of multiple abdominal surgeries including hysterectomy, cholesytectomy, colon resection due to ischemic bowel related to adhesions with colostomy with reversal presented to ED with abdominal pain x 1 week with intermittent nausea.CTAB/pelvis>>fluid-filled stomach and proximal small bowel, duodenal and jejunum with some dilatation of the jejunum up to 3.2 cm. There is a somewhat slow transition to decompressed loops in the anterior upper pelvis in the lower abdomen with some surrounding mesenteric stranding and fluid. No pneumatosis. This could be a mild, low-grade obstruction that is developing versus ileus. General surgery was consulted and admitted for further management. Treated with NG tube placement, now removed -Passing gas, had BM -Tolerated regular diet -General surgery has cleared for discharge -Will discharge home    Review of Systems:  Review of Systems  Constitutional:  Negative for chills and fever.  HENT:  Negative for congestion and sore throat.   Eyes:  Negative for double vision.  Respiratory:  Negative for cough, sputum production and shortness of breath.   Cardiovascular:  Negative for chest pain, palpitations and leg swelling.  Gastrointestinal:  Negative for abdominal pain, heartburn and nausea.  Genitourinary:  Negative for dysuria, frequency and hematuria.  Musculoskeletal:  Negative for falls and myalgias.  Neurological:  Negative for dizziness, sensory change and focal weakness.    Past Medical History:  Diagnosis Date   Asthma    Back pain    Chronic fatigue syndrome  Dr. Hyacinth Meeker   Complication of anesthesia    Cough    /SOB, PFT's nl, improved with Bronchodilation 8/11   Dysrhythmia    SVT   Endometrial cancer  (HCC)    Esophageal spasm    GERD Dr. Hyacinth Meeker, Dr. Laural Benes; egd 2006 nl; UGI 3/13 Small HH, moderate GERD, nl motility; seen at [facility] ([place]), egd? neg, sone improvement on sucralfate susp   Fibromyalgia    Foot pain    Dr. Netta Corrigan   Hashimoto's thyroiditis    High triglycerides    Hypothyroidism    Dr. Wynne Dust 4/14   Leukocytoclastic vasculitis (HCC)    Dr. Danella Deis   Low HDL (under 40)    Mitral valve prolapse    Paroxysmal atrial tachycardia    Dr. Anne Fu   Patent foramen ovale    Pernicious anemia    Dr. Hyacinth Meeker   PONV (postoperative nausea and vomiting)    along time ago had nausea ans vomiting after surgery-25 years ago   Reflux    ? delayed gastric emptying--GES nl 01/2012 (6% at 2hrs)   RLS (restless legs syndrome)    low ferritin Dr. Hyacinth Meeker   Rosacea    Sleep apnea    Uterine carcinoma (HCC)    Dr. Clifton James   Past Surgical History:  Procedure Laterality Date   ABDOMINAL EXPOSURE N/A 12/07/2021   Procedure: ABDOMINAL EXPOSURE;  Surgeon: Cephus Shelling, MD;  Location: [facility] OR;  Service: Vascular;  Laterality: N/A;   ABDOMINAL HYSTERECTOMY     bladder tack     x 2   BUNIONECTOMY     CALCANEAL OSTEOTOMY Right 01/04/2023   Procedure: CALCANEAL OSTEOTOMY;  Surgeon: Toni Arthurs, MD;  Location: [facility];  Service: Orthopedics;  Laterality: Right;   CHOLECYSTECTOMY  2009   COLONOSCOPY     scr, 12/2008 ([facility]) nl   COLOSTOMY Left 06/05/2018   Procedure: COLOSTOMY;  Surgeon: Axel Filler, MD;  Location: [facility] OR;  Service: General;  Laterality: Left;   COLOSTOMY REVERSAL N/A 10/16/2018   Procedure: HARTMANN'S COLOSTOMY REVERSAL, RIGID PROCTOSCOPY;  Surgeon: Axel Filler, MD;  Location: [facility] ORS;  Service: General;  Laterality: N/A;   GASTROCNEMIUS RECESSION Right 01/04/2023   Procedure: GASTROCNEMIUS recession;  Surgeon: Toni Arthurs, MD;  Location: [facility];  Service: Orthopedics;  Laterality: Right;   HAMMER TOE SURGERY     HYSTERECTOMY  ABDOMINAL WITH SALPINGO-OOPHORECTOMY  2008   INCISIONAL HERNIA REPAIR  04/09/2019   x2   INCISIONAL HERNIA REPAIR N/A 04/09/2019   Procedure: INCISIONAL HERNIA REPAIR WITH MESH;  Surgeon: Axel Filler, MD;  Location: [facility] OR;  Service: General;  Laterality: N/A;   LAPAROSCOPY N/A 10/16/2018   Procedure: LAPAROSCOPY DIAGNOSTIC WITH LYSIS OF ADHESIONS;  Surgeon: Axel Filler, MD;  Location: [facility];  Service: General;  Laterality: N/A;   LUNG BIOPSY     L Lower lung pulm nodules, largest 4.54mm, low risk, repeat CT in 1 yr now following with pulm at [facility]; 9/13 Stable tiny lung nodules, no f/u needed   LYMPH NODE DISSECTION  2008   LYSIS OF ADHESION N/A 04/09/2019   Procedure: OPEN LYSIS OF ADHESION;  Surgeon: Axel Filler, MD;  Location: [facility] OR;  Service: General;  Laterality: N/A;   METATARSAL OSTEOTOMY     NECK SURGERY     x 2   OBLIQUE LUMBAR INTERBODY FUSION 1 LEVEL WITH PERCUTANEOUS SCREWS N/A 12/07/2021   Procedure: OBLIQUE LUMBAR INTERBODY FUSION 1 LEVEL WITH PERCUTANEOUS SCREWS (OLIF L4-5 WITH  POSTERIOR SPINAL FUSION AND PEDICLE SCREWS);  Surgeon: Venita Lick, MD;  Location: [facility] OR;  Service: Orthopedics;  Laterality: N/A;  4 HRS DR. CLARK TO DO APPROACH LEFT TAP BLOCK WITH EXPAREL 3 C-BED   PARTIAL COLECTOMY N/A 06/05/2018   Procedure: EXPLORATORY LAPAROTOMY AND PARTIAL COLECTOMY;  Surgeon: Axel Filler, MD;  Location: [facility] OR;  Service: General;  Laterality: N/A;   POSTERIOR TIBIAL TENDON REPAIR Right 01/04/2023   Procedure: POSTERIOR TIBIAL TENDON TENOLYSIS AND  flexor digitorum longus TRANSFER TO NAVICULAR;  Surgeon: Toni Arthurs, MD;  Location: [facility];  Service: Orthopedics;  Laterality: Right;   TONSILLECTOMY     US ECHOCARDIOGRAPHY     with Nuclear test, Dr. Anne Fu, Low risk   VESICOVAGINAL FISTULA CLOSURE W/ TAH     Social History:   reports that she has never smoked. She has never used smokeless tobacco. She reports current alcohol use. She  reports that she does not use drugs.  Family History  Problem Relation Age of Onset   Hashimoto's thyroiditis Mother    High blood pressure Mother    Stroke Mother    Other Mother        Borderline DM   Alzheimer's disease Mother    Ulcerative colitis Father    Meniere's disease Father    Hearing loss Father    Stroke Father    Stroke Sister     Medications: Patient's Medications  New Prescriptions   No medications on file  Previous Medications   ACETAMINOPHEN (TYLENOL) 500 MG TABLET    Take 2 tablets (1,000 mg total) by mouth every 6 (six) hours as needed.   ALBUTEROL (PROVENTIL HFA;VENTOLIN HFA) 108 (90 BASE) MCG/ACT INHALER    Inhale 2 puffs into the lungs every 4 (four) hours as needed for wheezing or shortness of breath.   ARIPIPRAZOLE (ABILIFY) 2 MG TABLET    TAKE 1 TABLET BY MOUTH DAILY   ASCORBIC ACID (VITAMIN C PO)    Take 1 tablet by mouth daily.   ASPIRIN EC 81 MG TABLET    Take 81 mg by mouth at bedtime.   ATORVASTATIN (LIPITOR) 10 MG TABLET    TAKE 1 TABLET BY MOUTH DAILY   BUPROPION (WELLBUTRIN XL) 150 MG 24 HR TABLET    Take 3 tablets (450 mg total) by mouth daily.   CHOLECALCIFEROL (VITAMIN D-3 PO)    Take 1 capsule by mouth daily.   CYANOCOBALAMIN (VITAMIN B12) 1000 MCG/ML INJECTION    Inject 1 mL (1,000 mcg total) into the muscle every 30 (thirty) days.   DOCUSATE SODIUM (COLACE) 100 MG CAPSULE    Take 1 capsule (100 mg total) by mouth 2 (two) times daily. While taking narcotic pain medicine.   DOXYCYCLINE (PERIOSTAT) 20 MG TABLET    Take 20 mg by mouth See admin instructions. 20 mg twice daily for 7 days as needed for rosacea flares.   EPINEPHRINE 0.3 MG/0.3 ML IJ SOAJ INJECTION    Inject 0.3 mg into the muscle as needed for anaphylaxis.   FEXOFENADINE (ALLEGRA) 180 MG TABLET    Take 180 mg by mouth daily.   IPRATROPIUM (ATROVENT) 0.06 % NASAL SPRAY    Place 2 sprays into both nostrils 2 (two) times daily as needed for rhinitis.   KETOTIFEN (ZADITOR) 0.025 %  OPHTHALMIC SOLUTION    Place 1 drop into both eyes daily.   LEVOTHYROXINE (SYNTHROID) 175 MCG TABLET    Take 175 mcg by mouth daily before breakfast.  METOPROLOL SUCCINATE (TOPROL-XL) 25 MG 24 HR TABLET    TAKE 1 TABLET BY MOUTH ONCE  DAILY   NAPROXEN SODIUM (ALEVE) 220 MG TABLET    Take 220 mg by mouth daily as needed (general pain, headache).   NITROGLYCERIN (NITROSTAT) 0.4 MG SL TABLET    Place 1 tablet (0.4 mg total) under the tongue every 5 (five) minutes as needed for chest pain.   OMALIZUMAB (XOLAIR) 150 MG INJECTION    300 mg every 30 (thirty) days. Every 29th or 30th   PANTOPRAZOLE (PROTONIX) 40 MG TABLET    Take 40 mg by mouth 2 (two) times daily.   POLYETHYLENE GLYCOL (MIRALAX / GLYCOLAX) 17 G PACKET    Take 17 g by mouth daily as needed (constipation).   PRAMIPEXOLE (MIRAPEX) 0.125 MG TABLET    Take 3 tablets (0.375 mg total) by mouth at bedtime as needed (restless legs syndrome).   PROBIOTIC PRODUCT (PROBIOTIC PO)    Take 1 capsule by mouth daily.   TRELEGY ELLIPTA 200-62.5-25 MCG/ACT AEPB    Inhale 1 puff into the lungs at bedtime.  Modified Medications   No medications on file  Discontinued Medications   No medications on file    Physical Exam:  There were no vitals filed for this visit. There is no height or weight on file to calculate BMI. Wt Readings from Last 3 Encounters:  06/17/23 162 lb 14.7 oz (73.9 kg)  05/30/23 164 lb 12.8 oz (74.8 kg)  01/04/23 173 lb 8 oz (78.7 kg)    Physical Exam Constitutional:      Appearance: Normal appearance.  HENT:     Head: Normocephalic and atraumatic.  Cardiovascular:     Rate and Rhythm: Normal rate and regular rhythm.     Heart sounds: No murmur heard. Pulmonary:     Effort: Pulmonary effort is normal. No respiratory distress.     Breath sounds: Normal breath sounds. No wheezing.  Abdominal:     General: Bowel sounds are normal. There is no distension.     Palpations: Abdomen is soft.     Tenderness: There is no  abdominal tenderness. There is no guarding or rebound.  Musculoskeletal:        General: No swelling or tenderness.  Skin:    General: Skin is dry.  Neurological:     Mental Status: She is alert. Mental status is at baseline.     Sensory: No sensory deficit.     Motor: No weakness.      Labs reviewed: Basic Metabolic Panel: Recent Labs    06/13/23 1538 06/14/23 0130 06/15/23 0326 06/16/23 0353  NA 139 139 138 137  K 4.3 4.2 3.8 3.6  CL 105 104 100 100  CO2 26 27 24 23   GLUCOSE 91 88 55* 69*  BUN 12 14 13  6*  CREATININE 0.90 0.92 0.92 0.88  CALCIUM 9.3 9.1 9.1 8.6*  MG 2.2  --  2.0 1.8  PHOS 3.8  --   --   --    Liver Function Tests: Recent Labs    06/13/23 1005 06/13/23 1538 06/14/23 0130  AST 22 19 17   ALT 16 14 14   ALKPHOS 98 86 83  BILITOT 0.7 0.8 1.1  PROT 7.0 6.7 6.8  ALBUMIN 4.0 3.8 3.7   Recent Labs    06/13/23 1005  LIPASE 33   No results for input(s): "AMMONIA" in the last 8760 hours. CBC: Recent Labs    06/13/23 1005 06/13/23 1013 06/14/23 0130  06/15/23 0818 06/16/23 0353  WBC 13.9*   < > 13.0* 9.7 10.1  NEUTROABS 11.4*  --   --   --   --   HGB 14.5   < > 13.7 12.9 12.0  HCT 44.5   < > 41.2 38.3 34.6*  MCV 94.1   < > 96.9 95.5 91.3  PLT 402*   < > 373 326 327   < > = values in this interval not displayed.   Lipid Panel: No results for input(s): "CHOL", "HDL", "LDLCALC", "TRIG", "CHOLHDL", "LDLDIRECT" in the last 8760 hours. TSH: No results for input(s): "TSH" in the last 8760 hours. A1C: Lab Results  Component Value Date   HGBA1C 5.0 06/13/2023     Assessment/Plan  1. SBO (small bowel obstruction) (HCC) Pt is here for hospital follow up visit  Denies abdominal pain, nausea, vomiting, dysuria, hematuria, bloody or dark stools Tolerating PO diet  Pt states she has long standing h/o constipation , moves her bowels every other day Instructed patient to increase water and fiber intake Instructed her to take stool softeners.

## 2023-06-29 DIAGNOSIS — L501 Idiopathic urticaria: Secondary | ICD-10-CM | POA: Diagnosis not present

## 2023-07-05 DIAGNOSIS — J3081 Allergic rhinitis due to animal (cat) (dog) hair and dander: Secondary | ICD-10-CM | POA: Diagnosis not present

## 2023-07-05 DIAGNOSIS — Z91010 Allergy to peanuts: Secondary | ICD-10-CM | POA: Diagnosis not present

## 2023-07-05 DIAGNOSIS — J4540 Moderate persistent asthma, uncomplicated: Secondary | ICD-10-CM | POA: Diagnosis not present

## 2023-07-05 DIAGNOSIS — J3089 Other allergic rhinitis: Secondary | ICD-10-CM | POA: Diagnosis not present

## 2023-07-11 DIAGNOSIS — Z23 Encounter for immunization: Secondary | ICD-10-CM | POA: Diagnosis not present

## 2023-07-26 ENCOUNTER — Encounter: Payer: Self-pay | Admitting: Nurse Practitioner

## 2023-07-27 ENCOUNTER — Ambulatory Visit (INDEPENDENT_AMBULATORY_CARE_PROVIDER_SITE_OTHER): Payer: Medicare Other | Admitting: Nurse Practitioner

## 2023-07-27 ENCOUNTER — Encounter: Payer: Self-pay | Admitting: Nurse Practitioner

## 2023-07-27 VITALS — BP 120/84 | HR 64 | Temp 97.9°F | Ht 65.0 in | Wt 158.8 lb

## 2023-07-27 DIAGNOSIS — E785 Hyperlipidemia, unspecified: Secondary | ICD-10-CM | POA: Diagnosis not present

## 2023-07-27 DIAGNOSIS — I4719 Other supraventricular tachycardia: Secondary | ICD-10-CM

## 2023-07-27 DIAGNOSIS — J45909 Unspecified asthma, uncomplicated: Secondary | ICD-10-CM

## 2023-07-27 DIAGNOSIS — N390 Urinary tract infection, site not specified: Secondary | ICD-10-CM

## 2023-07-27 DIAGNOSIS — F32A Depression, unspecified: Secondary | ICD-10-CM

## 2023-07-27 DIAGNOSIS — K219 Gastro-esophageal reflux disease without esophagitis: Secondary | ICD-10-CM | POA: Diagnosis not present

## 2023-07-27 DIAGNOSIS — R300 Dysuria: Secondary | ICD-10-CM | POA: Diagnosis not present

## 2023-07-27 DIAGNOSIS — G2581 Restless legs syndrome: Secondary | ICD-10-CM

## 2023-07-27 DIAGNOSIS — E039 Hypothyroidism, unspecified: Secondary | ICD-10-CM

## 2023-07-27 LAB — POCT URINALYSIS DIPSTICK (MANUAL)
Nitrite, UA: POSITIVE — AB
Poct Bilirubin: NEGATIVE
Poct Blood: 250 — AB
Poct Glucose: NORMAL mg/dL
Poct Ketones: NEGATIVE
Poct Protein: NEGATIVE mg/dL
Poct Urobilinogen: NORMAL mg/dL
Spec Grav, UA: 1.01 (ref 1.010–1.025)
pH, UA: 7.5 (ref 5.0–8.0)

## 2023-07-27 MED ORDER — NITROFURANTOIN MONOHYD MACRO 100 MG PO CAPS
100.0000 mg | ORAL_CAPSULE | Freq: Two times a day (BID) | ORAL | 0 refills | Status: DC
Start: 2023-07-27 — End: 2023-07-30

## 2023-07-27 NOTE — Assessment & Plan Note (Signed)
on Mirapex

## 2023-07-27 NOTE — Assessment & Plan Note (Signed)
UTI symptoms: burning sensation upon urination, urinary frequency, urgency, suprapubic area tenderness, generalized malaise for 10 days.  UA C/S Will treat Nitrofurantonin 100mg  bid x 7 days.  07/30/23 dc Nitrofurantoin, start Cefedinir 300mg  bid x 5 days per urine C/S

## 2023-07-27 NOTE — Assessment & Plan Note (Signed)
prn Epi, Albuterol, Atrovent nasal, Allegra, Zaditor eye drops, Trelegy, Xolair

## 2023-07-27 NOTE — Assessment & Plan Note (Signed)
Heart rate is controlled,  on Metoprolol. Bun/creat 6/0.88 06/16/23

## 2023-07-27 NOTE — Assessment & Plan Note (Signed)
taking Pantoprazole, Hgb 12 06/16/23

## 2023-07-27 NOTE — Progress Notes (Signed)
Location:   Clinic [facility]   Place of Service:    Provider: Chipper Oman NP  Code Status: DNR Goals of Care:     07/26/2023    3:54 PM  Advanced Directives  Does Patient Have a Medical Advance Directive? Yes  Type of Advance Directive Living will;Healthcare Power of Attorney  Does patient want to make changes to medical advance directive? No - Patient declined  Copy of Healthcare Power of Attorney in Chart? Yes - validated most recent copy scanned in chart (See row information)     Chief Complaint  Patient presents with  . Acute Visit    Possible UTI, patient c/o burning when urinating. Patient does not produce a lot of urine when she voids. Symptoms x 1.5 weeks. ? If UTI related to Xyzal, patient recently stopped.     HPI: Patient is a 67 y.o. female seen today for an acute visit for UTI symptoms: burning sensation upon urination, urinary frequency, urgency, suprapubic area tenderness, generalized malaise for 10 days.     Depression, on Abilify, Wellbutrin  Asthma prn Epi, Albuterol, Atrovent nasal, Allegra, Zaditor eye drops, Trelegy, Xolair  RLE, on Mirapex  GERD, taking Pantoprazole, Hgb 12 06/16/23  Hypothyroidism, on Levothyroxine Paroxysmal atrial tachycardia,  on Metoprolol. Bun/creat 6/0.88 06/16/23  HLD on Atorvastatin Past Medical History:  Diagnosis Date  . Asthma   . Back pain   . Chronic fatigue syndrome    Dr. Hyacinth Meeker  . Complication of anesthesia   . Cough    /SOB, PFT's nl, improved with Bronchodilation 8/11  . Dysrhythmia    SVT  . Endometrial cancer (HCC)   . Esophageal spasm    GERD Dr. Hyacinth Meeker, Dr. Laural Benes; egd 2006 nl; UGI 3/13 Small HH, moderate GERD, nl motility; seen at [facility] ([place]), egd? neg, sone improvement on sucralfate susp  . Fibromyalgia   . Foot pain    Dr. Netta Corrigan  . Hashimoto's thyroiditis   . High triglycerides   . Hypothyroidism    Dr. Wynne Dust 4/14  . Leukocytoclastic vasculitis (HCC)    Dr. Danella Deis  . Low HDL (under 40)   .  Mitral valve prolapse   . Paroxysmal atrial tachycardia    Dr. Anne Fu  . Patent foramen ovale   . Pernicious anemia    Dr. Hyacinth Meeker  . PONV (postoperative nausea and vomiting)    along time ago had nausea ans vomiting after surgery-25 years ago  . Reflux    ? delayed gastric emptying--GES nl 01/2012 (6% at 2hrs)  . RLS (restless legs syndrome)    low ferritin Dr. Hyacinth Meeker  . Rosacea   . Sleep apnea   . Uterine carcinoma (HCC)    Dr. Clifton James    Past Surgical History:  Procedure Laterality Date  . ABDOMINAL EXPOSURE N/A 12/07/2021   Procedure: ABDOMINAL EXPOSURE;  Surgeon: Cephus Shelling, MD;  Location: [facility] OR;  Service: Vascular;  Laterality: N/A;  . ABDOMINAL HYSTERECTOMY    . bladder tack     x 2  . BUNIONECTOMY    . CALCANEAL OSTEOTOMY Right 01/04/2023   Procedure: CALCANEAL OSTEOTOMY;  Surgeon: Toni Arthurs, MD;  Location: [facility];  Service: Orthopedics;  Laterality: Right;  . CHOLECYSTECTOMY  2009  . COLONOSCOPY     scr, 12/2008 ([place]) nl  . COLOSTOMY Left 06/05/2018   Procedure: COLOSTOMY;  Surgeon: Axel Filler, MD;  Location: [facility] OR;  Service: General;  Laterality: Left;  . COLOSTOMY REVERSAL N/A 10/16/2018   Procedure:  HARTMANN'S COLOSTOMY REVERSAL, RIGID PROCTOSCOPY;  Surgeon: Axel Filler, MD;  Location: [facility] ORS;  Service: General;  Laterality: N/A;  . GASTROCNEMIUS RECESSION Right 01/04/2023   Procedure: GASTROCNEMIUS recession;  Surgeon: Toni Arthurs, MD;  Location: [facility];  Service: Orthopedics;  Laterality: Right;  . HAMMER TOE SURGERY    . HYSTERECTOMY ABDOMINAL WITH SALPINGO-OOPHORECTOMY  2008  . INCISIONAL HERNIA REPAIR  04/09/2019   x2  . INCISIONAL HERNIA REPAIR N/A 04/09/2019   Procedure: INCISIONAL HERNIA REPAIR WITH MESH;  Surgeon: Axel Filler, MD;  Location: [facility] OR;  Service: General;  Laterality: N/A;  . LAPAROSCOPY N/A 10/16/2018   Procedure: LAPAROSCOPY DIAGNOSTIC WITH LYSIS OF ADHESIONS;  Surgeon: Axel Filler, MD;  Location: [facility];  Service: General;  Laterality: N/A;  . LUNG BIOPSY     L Lower lung pulm nodules, largest 4.46mm, low risk, repeat CT in 1 yr now following with pulm at [facility]; 9/13 Stable tiny lung nodules, no f/u needed  . LYMPH NODE DISSECTION  2008  . LYSIS OF ADHESION N/A 04/09/2019   Procedure: OPEN LYSIS OF ADHESION;  Surgeon: Axel Filler, MD;  Location: [facility] OR;  Service: General;  Laterality: N/A;  . METATARSAL OSTEOTOMY    . NECK SURGERY     x 2  . OBLIQUE LUMBAR INTERBODY FUSION 1 LEVEL WITH PERCUTANEOUS SCREWS N/A 12/07/2021   Procedure: OBLIQUE LUMBAR INTERBODY FUSION 1 LEVEL WITH PERCUTANEOUS SCREWS (OLIF L4-5 WITH POSTERIOR SPINAL FUSION AND PEDICLE SCREWS);  Surgeon: Venita Lick, MD;  Location: [facility] OR;  Service: Orthopedics;  Laterality: N/A;  4 HRS DR. CLARK TO DO APPROACH LEFT TAP BLOCK WITH EXPAREL 3 C-BED  . PARTIAL COLECTOMY N/A 06/05/2018   Procedure: EXPLORATORY LAPAROTOMY AND PARTIAL COLECTOMY;  Surgeon: Axel Filler, MD;  Location: [facility] OR;  Service: General;  Laterality: N/A;  . POSTERIOR TIBIAL TENDON REPAIR Right 01/04/2023   Procedure: POSTERIOR TIBIAL TENDON TENOLYSIS AND  flexor digitorum longus TRANSFER TO NAVICULAR;  Surgeon: Toni Arthurs, MD;  Location: [facility];  Service: Orthopedics;  Laterality: Right;  . TONSILLECTOMY    . US ECHOCARDIOGRAPHY     with Nuclear test, Dr. Anne Fu, Low risk  . VESICOVAGINAL FISTULA CLOSURE W/ TAH      Allergies  Allergen Reactions  . Asa [Aspirin] Anaphylaxis    325mg  or higher Pt ok with 81mg  daily dose  . Bee Venom Shortness Of Breath, Itching, Swelling and Hives  . Peanut-Containing Drug Products Anaphylaxis and Other (See Comments)  . Ambien [Zolpidem Tartrate] Other (See Comments)    Hallucinations  . Tape Itching, Swelling and Rash  . Cipro [Ciprofloxacin Hcl] Nausea And Vomiting  . Latex Rash  . Minocycline Rash  . Shellfish Allergy Rash    Allergies as of 07/27/2023        Reactions   Asa [aspirin] Anaphylaxis   325mg  or higher Pt ok with 81mg  daily dose   Bee Venom Shortness Of Breath, Itching, Swelling, Hives   Peanut-containing Drug Products Anaphylaxis, Other (See Comments)   Ambien [zolpidem Tartrate] Other (See Comments)   Hallucinations   Tape Itching, Swelling, Rash   Cipro [ciprofloxacin Hcl] Nausea And Vomiting   Latex Rash   Minocycline Rash   Shellfish Allergy Rash        Medication List        Accurate as of July 27, 2023  4:42 PM. If you have any questions, ask your nurse or doctor.  STOP taking these medications    fexofenadine 180 MG tablet Commonly known as: ALLEGRA Stopped by: Trampas Stettner X Louann Hopson       TAKE these medications    acetaminophen 500 MG tablet Commonly known as: TYLENOL Take 2 tablets (1,000 mg total) by mouth every 6 (six) hours as needed.   albuterol 108 (90 Base) MCG/ACT inhaler Commonly known as: VENTOLIN HFA Inhale 2 puffs into the lungs every 4 (four) hours as needed for wheezing or shortness of breath.   ARIPiprazole 2 MG tablet Commonly known as: ABILIFY TAKE 1 TABLET BY MOUTH DAILY   aspirin EC 81 MG tablet Take 81 mg by mouth at bedtime.   atorvastatin 10 MG tablet Commonly known as: LIPITOR TAKE 1 TABLET BY MOUTH DAILY   buPROPion 150 MG 24 hr tablet Commonly known as: WELLBUTRIN XL Take 3 tablets (450 mg total) by mouth daily.   cyanocobalamin 1000 MCG/ML injection Commonly known as: VITAMIN B12 Inject 1 mL (1,000 mcg total) into the muscle every 30 (thirty) days.   docusate sodium 100 MG capsule Commonly known as: Colace Take 1 capsule (100 mg total) by mouth 2 (two) times daily. While taking narcotic pain medicine.   doxycycline 20 MG tablet Commonly known as: PERIOSTAT Take 20 mg by mouth See admin instructions. 20 mg twice daily for 7 days as needed for rosacea flares.   EPINEPHrine 0.3 mg/0.3 mL Soaj injection Commonly known as: EPI-PEN Inject 0.3 mg  into the muscle as needed for anaphylaxis.   ipratropium 0.06 % nasal spray Commonly known as: ATROVENT Place 2 sprays into both nostrils 2 (two) times daily as needed for rhinitis.   ketotifen 0.025 % ophthalmic solution Commonly known as: ZADITOR Place 1 drop into both eyes daily.   levothyroxine 175 MCG tablet Commonly known as: SYNTHROID Take 175 mcg by mouth daily before breakfast.   metoprolol succinate 25 MG 24 hr tablet Commonly known as: TOPROL-XL TAKE 1 TABLET BY MOUTH ONCE  DAILY   naproxen sodium 220 MG tablet Commonly known as: ALEVE Take 220 mg by mouth daily as needed (general pain, headache).   nitrofurantoin (macrocrystal-monohydrate) 100 MG capsule Commonly known as: Macrobid Take 1 capsule (100 mg total) by mouth 2 (two) times daily for 7 days. Started by: Chistopher Mangino X Kaysea Raya   nitroGLYCERIN 0.4 MG SL tablet Commonly known as: NITROSTAT Place 1 tablet (0.4 mg total) under the tongue every 5 (five) minutes as needed for chest pain.   pantoprazole 40 MG tablet Commonly known as: PROTONIX Take 40 mg by mouth 2 (two) times daily.   polyethylene glycol 17 g packet Commonly known as: MIRALAX / GLYCOLAX Take 17 g by mouth daily as needed (constipation).   pramipexole 0.125 MG tablet Commonly known as: MIRAPEX Take 3 tablets (0.375 mg total) by mouth at bedtime as needed (restless legs syndrome).   PROBIOTIC PO Take 1 capsule by mouth daily.   Trelegy Ellipta 200-62.5-25 MCG/ACT Aepb Generic drug: Fluticasone-Umeclidin-Vilant Inhale 1 puff into the lungs at bedtime.   VITAMIN C PO Take 1 tablet by mouth daily.   VITAMIN D-3 PO Take 1 capsule by mouth daily.   Xolair 150 MG injection Generic drug: omalizumab 300 mg every 30 (thirty) days. Every 29th or 30th        Review of Systems:  Review of Systems  Constitutional:  Positive for fatigue. Negative for appetite change and fever.  HENT:  Negative for congestion.   Respiratory:  Negative for cough.    Cardiovascular:  Negative for leg swelling.  Gastrointestinal:  Positive for abdominal pain. Negative for constipation, nausea and vomiting.       Suprapubic area discomfort.   Genitourinary:  Positive for dysuria, frequency and urgency.  Musculoskeletal:  Negative for gait problem.  Neurological:  Negative for tremors and headaches.  Psychiatric/Behavioral:  Negative for confusion.     Health Maintenance  Topic Date Due  . MAMMOGRAM  Never done  . DEXA SCAN  Never done  . Zoster Vaccines- Shingrix (2 of 2) 07/27/2020  . Pneumonia Vaccine 50+ Years old (2 of 2 - PCV) 08/22/2022  . COVID-19 Vaccine (7 - 2023-24 season) 07/01/2023  . Medicare Annual Wellness (AWV)  12/13/2023  . DTaP/Tdap/Td (3 - Tdap) 02/05/2028  . Colonoscopy  10/04/2028  . INFLUENZA VACCINE  Completed  . Hepatitis C Screening  Completed  . HPV VACCINES  Aged Out    Physical Exam: Vitals:   07/27/23 1513  BP: 120/84  Pulse: 64  Temp: 97.9 F (36.6 C)  TempSrc: Temporal  SpO2: 99%  Weight: 158 lb 12.8 oz (72 kg)  Height: 5\' 5"  (1.651 m)   Body mass index is 26.43 kg/m. Physical Exam Vitals and nursing note reviewed.  Constitutional:      Appearance: Normal appearance.  HENT:     Head: Normocephalic and atraumatic.     Nose: Nose normal.     Mouth/Throat:     Mouth: Mucous membranes are moist.  Eyes:     Extraocular Movements: Extraocular movements intact.     Conjunctiva/sclera: Conjunctivae normal.     Pupils: Pupils are equal, round, and reactive to light.  Cardiovascular:     Rate and Rhythm: Normal rate and regular rhythm.     Heart sounds: No murmur heard. Pulmonary:     Effort: Pulmonary effort is normal.     Breath sounds: No rales.  Abdominal:     General: Bowel sounds are normal.     Palpations: Abdomen is soft.     Tenderness: There is abdominal tenderness. There is no right CVA tenderness, left CVA tenderness, guarding or rebound.     Comments: Suprapubic area discomfort.    Musculoskeletal:     Cervical back: Normal range of motion and neck supple.     Left lower leg: No edema.  Skin:    General: Skin is warm and dry.  Neurological:     General: No focal deficit present.     Mental Status: She is alert and oriented to person, place, and time. Mental status is at baseline.     Gait: Gait normal.  Psychiatric:        Mood and Affect: Mood normal.        Behavior: Behavior normal.        Thought Content: Thought content normal.        Judgment: Judgment normal.    Labs reviewed: Basic Metabolic Panel: Recent Labs    06/13/23 1538 06/14/23 0130 06/15/23 0326 06/16/23 0353  NA 139 139 138 137  K 4.3 4.2 3.8 3.6  CL 105 104 100 100  CO2 26 27 24 23   GLUCOSE 91 88 55* 69*  BUN 12 14 13  6*  CREATININE 0.90 0.92 0.92 0.88  CALCIUM 9.3 9.1 9.1 8.6*  MG 2.2  --  2.0 1.8  PHOS 3.8  --   --   --    Liver Function Tests: Recent Labs    06/13/23 1005 06/13/23 1538 06/14/23 0130  AST 22 19  17  ALT 16 14 14   ALKPHOS 98 86 83  BILITOT 0.7 0.8 1.1  PROT 7.0 6.7 6.8  ALBUMIN 4.0 3.8 3.7   Recent Labs    06/13/23 1005  LIPASE 33   No results for input(s): "AMMONIA" in the last 8760 hours. CBC: Recent Labs    06/13/23 1005 06/13/23 1013 06/14/23 0130 06/15/23 0818 06/16/23 0353  WBC 13.9*   < > 13.0* 9.7 10.1  NEUTROABS 11.4*  --   --   --   --   HGB 14.5   < > 13.7 12.9 12.0  HCT 44.5   < > 41.2 38.3 34.6*  MCV 94.1   < > 96.9 95.5 91.3  PLT 402*   < > 373 326 327   < > = values in this interval not displayed.   Lipid Panel: No results for input(s): "CHOL", "HDL", "LDLCALC", "TRIG", "CHOLHDL", "LDLDIRECT" in the last 8760 hours. Lab Results  Component Value Date   HGBA1C 5.0 06/13/2023    Procedures since last visit: No results found.  Assessment/Plan Hyperlipidemia on Atorvastatin  Paroxysmal atrial tachycardia Heart rate is controlled,  on Metoprolol. Bun/creat 6/0.88 06/16/23  Hypothyroidism  on  Levothyroxine  GERD (gastroesophageal reflux disease)  taking Pantoprazole, Hgb 12 06/16/23  Restless legs  on Mirapex  Asthma prn Epi, Albuterol, Atrovent nasal, Allegra, Zaditor eye drops, Trelegy, Xolair  Depression on Abilify, Wellbutrin  Dysuria UTI symptoms: burning sensation upon urination, urinary frequency, urgency, suprapubic area tenderness, generalized malaise for 10 days.  UA C/S Will treat Nitrofurantonin 100mg  bid x 7 days.     Labs/tests ordered:  none  Next appt:  11/28/2023

## 2023-07-27 NOTE — Assessment & Plan Note (Signed)
on Abilify, Wellbutrin

## 2023-07-27 NOTE — Assessment & Plan Note (Signed)
on Atorvastatin 

## 2023-07-27 NOTE — Assessment & Plan Note (Signed)
on Levothyroxine

## 2023-07-30 ENCOUNTER — Other Ambulatory Visit: Payer: Self-pay | Admitting: Nurse Practitioner

## 2023-07-30 ENCOUNTER — Encounter: Payer: Self-pay | Admitting: Nurse Practitioner

## 2023-07-30 DIAGNOSIS — N390 Urinary tract infection, site not specified: Secondary | ICD-10-CM

## 2023-07-30 DIAGNOSIS — L501 Idiopathic urticaria: Secondary | ICD-10-CM | POA: Diagnosis not present

## 2023-07-30 LAB — URINE CULTURE
MICRO NUMBER:: 15526792
SPECIMEN QUALITY:: ADEQUATE

## 2023-07-30 MED ORDER — CEFDINIR 300 MG PO CAPS: 300.0000 mg | ORAL_CAPSULE | Freq: Two times a day (BID) | ORAL | 0 refills | Status: AC

## 2023-08-15 ENCOUNTER — Other Ambulatory Visit: Payer: Self-pay | Admitting: Cardiology

## 2023-08-22 ENCOUNTER — Ambulatory Visit: Payer: No Typology Code available for payment source | Admitting: Cardiology

## 2023-08-28 ENCOUNTER — Encounter: Payer: Self-pay | Admitting: Cardiology

## 2023-08-28 ENCOUNTER — Other Ambulatory Visit: Payer: Self-pay

## 2023-08-28 ENCOUNTER — Ambulatory Visit: Payer: Medicare Other | Attending: Cardiology | Admitting: Cardiology

## 2023-08-28 VITALS — BP 138/66 | HR 73 | Ht 65.0 in | Wt 149.8 lb

## 2023-08-28 DIAGNOSIS — E7800 Pure hypercholesterolemia, unspecified: Secondary | ICD-10-CM | POA: Diagnosis not present

## 2023-08-28 DIAGNOSIS — I700 Atherosclerosis of aorta: Secondary | ICD-10-CM

## 2023-08-28 DIAGNOSIS — R072 Precordial pain: Secondary | ICD-10-CM

## 2023-08-28 DIAGNOSIS — Q2112 Patent foramen ovale: Secondary | ICD-10-CM

## 2023-08-28 DIAGNOSIS — I4719 Other supraventricular tachycardia: Secondary | ICD-10-CM

## 2023-08-28 NOTE — Patient Instructions (Signed)
Medication Instructions:  Your physician recommends that you continue on your current medications as directed. Please refer to the Current Medication list given to you today. *If you need a refill on your cardiac medications before your next appointment, please call your pharmacy*   Lab Work: FASTING LIPID PANEL IN 1-2 WEEKS  If you have labs (blood work) drawn today and your tests are completely normal, you will receive your results only by: MyChart Message (if you have MyChart) OR A paper copy in the mail If you have any lab test that is abnormal or we need to change your treatment, we will call you to review the results.   Testing/Procedures: NONE ORDERED   Follow-Up: At [facility], you and your health needs are our priority.  As part of our continuing mission to provide you with exceptional heart care, we have created designated Provider Care Teams.  These Care Teams include your primary Cardiologist (physician) and Advanced Practice Providers (APPs -  Physician Assistants and Nurse Practitioners) who all work together to provide you with the care you need, when you need it.  We recommend signing up for the patient portal called "MyChart".  Sign up information is provided on this After Visit Summary.  MyChart is used to connect with patients for Virtual Visits (Telemedicine).  Patients are able to view lab/test results, encounter notes, upcoming appointments, etc.  Non-urgent messages can be sent to your provider as well.   To learn more about what you can do with MyChart, go to [email or web address].    Your next appointment:   12 month(s)  Provider:   Donato Schultz, MD     Other Instructions

## 2023-08-28 NOTE — Progress Notes (Signed)
Cardiology Office Note:   Date:  08/28/2023  ID:  BRAYLEI ELLENWOOD, DOB [birth year], MRN [id number] PCP: Venita Sheffield, MD  [facility] Providers Cardiologist:  Donato Schultz, MD    History of Present Illness:   Discussed the use of AI scribe software for clinical note transcription with the patient, who gave verbal consent to proceed.  History of Present Illness   The patient, a 67 year old with a history of precordial pain, patent foramen ovale, paroxysmal atrial tachycardia, pure hypercholesterolemia, and aortic atherosclerosis, reports a stable year without chest discomfort or shortness of breath. The patient's last stress test in the previous year showed no EKG changes or perfusion defects.  In an effort to improve health, the patient embarked on a weight loss journey in November of the previous year, and has successfully lost 45 pounds as of today. This weight loss was achieved through increased physical activity and careful dietary changes. The patient reports feeling younger and more energetic, with improved exertional tolerance.  The patient uses a CPAP machine for sleep and reports no issues with its settings.  The patient's allergist has told patient she has concern about lower extremity size. Per patient, increased size of LE is not new and she describes as 'heavy legs.' She has no leg pain or chronic wounds.   The patient's medication regimen includes Toprol XL 25 for palpitations, Atorvastatin 10 for cholesterol management, and a daily baby aspirin. The patient reports no issues with bleeding or bruising.  The patient's cholesterol was last checked in 2022, with an LDL of 67. The patient is not fasting at the time of this consultation, but agrees to return for a fasting lipid panel to check current cholesterol levels.  The patient has a history of paroxysmal atrial tachycardia but reports no recent symptoms. A carotid study conducted in 2015 showed soft plaque, but the  patient has not experienced symptoms such as headaches that would indicate an issue. The patient's cholesterol is well-controlled, and she is not experiencing symptoms of heart failure.      Today patient denies chest pain, shortness of breath, lower extremity edema, fatigue, palpitations, melena, hematuria, hemoptysis, diaphoresis, weakness, presyncope, syncope, orthopnea, and PND.   Studies Reviewed:    03/06/22 Stress Test    The study is normal. The study is low risk.   No ST deviation was noted.   LV perfusion is normal. There is no evidence of ischemia. There is no evidence of infarction.   Left ventricular function is normal. Nuclear stress EF: 61 %. The left ventricular ejection fraction is normal (55-65%). End diastolic cavity size is normal. End systolic cavity size is normal.   Prior study not available for comparison.  Risk Assessment/Calculations:              Physical Exam:   VS:  BP 138/66   Pulse 73   Ht 5\' 5"  (1.651 m)   Wt 149 lb 12.8 oz (67.9 kg)   SpO2 96%   BMI 24.93 kg/m    Wt Readings from Last 3 Encounters:  08/28/23 149 lb 12.8 oz (67.9 kg)  07/27/23 158 lb 12.8 oz (72 kg)  06/26/23 160 lb (72.6 kg)     Physical Exam Vitals reviewed.  Constitutional:      Appearance: Normal appearance.  HENT:     Head: Normocephalic.     Nose: Nose normal.  Eyes:     Pupils: Pupils are equal, round, and reactive to light.  Cardiovascular:  Rate and Rhythm: Normal rate and regular rhythm.     Pulses: Normal pulses.     Heart sounds: Normal heart sounds. No murmur heard.    No friction rub. No gallop.  Pulmonary:     Effort: Pulmonary effort is normal.     Breath sounds: Normal breath sounds.  Abdominal:     General: Abdomen is flat.  Musculoskeletal:     Right lower leg: Edema (mild non-pitting edema. Suggestive of lymphedema) present.     Left lower leg: Edema (mild non-pitting edema. Suggestive of lymphedema) present.  Skin:    General: Skin is  warm and dry.     Capillary Refill: Capillary refill takes less than 2 seconds.  Neurological:     General: No focal deficit present.     Mental Status: She is alert and oriented to person, place, and time.  Psychiatric:        Mood and Affect: Mood normal.        Behavior: Behavior normal.        Thought Content: Thought content normal.        Judgment: Judgment normal.        ASSESSMENT AND PLAN:     Assessment and Plan    Weight Loss Significant weight loss of 45 pounds since November 2023. Improved energy levels and exertional tolerance. -Congratulated patient on weight loss journey. Continue current diet and exercise regimen.  Hypercholesterolemia Last LDL was 67 in May 2022. Likely improved with recent weight loss. -Check fasting lipid panel in the next couple of weeks. -Continue Atorvastatin 10mg    Precordial Pain No chest pain or shortness of breath in the past year. Stress test in May 2023 showed no EKG changes or perfusion defects. Recent clinic ECG normal. -Continue monitoring symptoms.  Paroxysmal Atrial Tachycardia No recent symptoms of palpitations. -Continue Toprol XL 25mg  daily.  Aortic atherosclerosis CT abd/pelvis in 2019 incidentally noted abdominal aortic atherosclerosis. -Continue ASA 81mg , lipid management, close monitoring of BP.  Patent Foramen Ovale No symptoms of heart failure or stroke. -Continue daily baby aspirin.  Lower Extremity Edema Possible lymphedema. No symptoms of heart failure. -Try compression stockings.  Sleep Apnea Using CPAP machine nightly.  -Continue current CPAP use per sleep specialist  Follow-up in 1 year unless patient needs otherwise.             Signed, Perlie Gold, PA-C

## 2023-08-30 DIAGNOSIS — L501 Idiopathic urticaria: Secondary | ICD-10-CM | POA: Diagnosis not present

## 2023-09-03 ENCOUNTER — Ambulatory Visit
Admit: 2023-09-03 | Discharge: 2023-09-04 | Payer: MEDICARE | Attending: Student in an Organized Health Care Education/Training Program | Primary: Student in an Organized Health Care Education/Training Program

## 2023-09-03 DIAGNOSIS — L719 Rosacea, unspecified: Principal | ICD-10-CM

## 2023-09-03 DIAGNOSIS — L958 Other vasculitis limited to the skin: Principal | ICD-10-CM

## 2023-09-06 DIAGNOSIS — E7800 Pure hypercholesterolemia, unspecified: Secondary | ICD-10-CM | POA: Diagnosis not present

## 2023-09-07 LAB — LIPID PANEL
Chol/HDL Ratio: 3.5 ratio (ref 0.0–4.4)
Cholesterol, Total: 134 mg/dL (ref 100–199)
HDL: 38 mg/dL — ABNORMAL LOW (ref >39)
LDL Chol Calc (NIH): 70 mg/dL (ref 0–99)
Triglycerides: 148 mg/dL (ref 0–149)
VLDL Cholesterol Cal: 26 mg/dL (ref 5–40)

## 2023-09-19 ENCOUNTER — Other Ambulatory Visit: Payer: Self-pay | Admitting: Family Medicine

## 2023-09-21 NOTE — Telephone Encounter (Signed)
Patient is requesting 1 year supply.

## 2023-09-28 ENCOUNTER — Other Ambulatory Visit: Payer: Self-pay

## 2023-09-28 ENCOUNTER — Emergency Department (HOSPITAL_BASED_OUTPATIENT_CLINIC_OR_DEPARTMENT_OTHER): Payer: No Typology Code available for payment source

## 2023-09-28 ENCOUNTER — Ambulatory Visit (HOSPITAL_COMMUNITY)
Admission: EM | Admit: 2023-09-28 | Discharge: 2023-09-28 | Disposition: A | Discharge status: Home / Self Care | Payer: Medicare Other

## 2023-09-28 ENCOUNTER — Encounter (HOSPITAL_BASED_OUTPATIENT_CLINIC_OR_DEPARTMENT_OTHER): Payer: Self-pay

## 2023-09-28 ENCOUNTER — Emergency Department (HOSPITAL_BASED_OUTPATIENT_CLINIC_OR_DEPARTMENT_OTHER): Payer: Medicare Other | Admitting: Radiology

## 2023-09-28 ENCOUNTER — Emergency Department (HOSPITAL_BASED_OUTPATIENT_CLINIC_OR_DEPARTMENT_OTHER)
Admission: EM | Admit: 2023-09-28 | Discharge: 2023-09-28 | Disposition: A | Discharge status: Home / Self Care | Payer: Medicare Other | Attending: Emergency Medicine | Admitting: Emergency Medicine

## 2023-09-28 ENCOUNTER — Encounter (HOSPITAL_COMMUNITY): Payer: Self-pay | Admitting: Emergency Medicine

## 2023-09-28 ENCOUNTER — Emergency Department (HOSPITAL_BASED_OUTPATIENT_CLINIC_OR_DEPARTMENT_OTHER): Payer: Medicare Other

## 2023-09-28 DIAGNOSIS — E039 Hypothyroidism, unspecified: Secondary | ICD-10-CM | POA: Diagnosis not present

## 2023-09-28 DIAGNOSIS — S0990XA Unspecified injury of head, initial encounter: Secondary | ICD-10-CM

## 2023-09-28 DIAGNOSIS — R519 Headache, unspecified: Secondary | ICD-10-CM | POA: Insufficient documentation

## 2023-09-28 DIAGNOSIS — Z8541 Personal history of malignant neoplasm of cervix uteri: Secondary | ICD-10-CM | POA: Diagnosis not present

## 2023-09-28 DIAGNOSIS — Z79899 Other long term (current) drug therapy: Secondary | ICD-10-CM | POA: Insufficient documentation

## 2023-09-28 DIAGNOSIS — S298XXA Other specified injuries of thorax, initial encounter: Secondary | ICD-10-CM

## 2023-09-28 DIAGNOSIS — Z7982 Long term (current) use of aspirin: Secondary | ICD-10-CM | POA: Insufficient documentation

## 2023-09-28 DIAGNOSIS — S299XXA Unspecified injury of thorax, initial encounter: Secondary | ICD-10-CM | POA: Diagnosis not present

## 2023-09-28 DIAGNOSIS — J45909 Unspecified asthma, uncomplicated: Secondary | ICD-10-CM | POA: Diagnosis not present

## 2023-09-28 DIAGNOSIS — W19XXXA Unspecified fall, initial encounter: Secondary | ICD-10-CM

## 2023-09-28 DIAGNOSIS — Z043 Encounter for examination and observation following other accident: Secondary | ICD-10-CM | POA: Diagnosis not present

## 2023-09-28 DIAGNOSIS — Z91040 Latex allergy status: Secondary | ICD-10-CM | POA: Diagnosis not present

## 2023-09-28 DIAGNOSIS — W101XXA Fall (on)(from) sidewalk curb, initial encounter: Secondary | ICD-10-CM

## 2023-09-28 DIAGNOSIS — Z23 Encounter for immunization: Secondary | ICD-10-CM | POA: Insufficient documentation

## 2023-09-28 DIAGNOSIS — S01111A Laceration without foreign body of right eyelid and periocular area, initial encounter: Secondary | ICD-10-CM | POA: Diagnosis not present

## 2023-09-28 DIAGNOSIS — Z91010 Allergy to peanuts: Secondary | ICD-10-CM | POA: Insufficient documentation

## 2023-09-28 DIAGNOSIS — S0993XA Unspecified injury of face, initial encounter: Secondary | ICD-10-CM | POA: Diagnosis present

## 2023-09-28 DIAGNOSIS — R220 Localized swelling, mass and lump, head: Secondary | ICD-10-CM | POA: Diagnosis not present

## 2023-09-28 DIAGNOSIS — I6782 Cerebral ischemia: Secondary | ICD-10-CM | POA: Diagnosis not present

## 2023-09-28 DIAGNOSIS — S0181XA Laceration without foreign body of other part of head, initial encounter: Secondary | ICD-10-CM

## 2023-09-28 DIAGNOSIS — R0781 Pleurodynia: Secondary | ICD-10-CM | POA: Diagnosis not present

## 2023-09-28 MED ORDER — TETANUS-DIPHTH-ACELL PERTUSSIS 5-2.5-18.5 LF-MCG/0.5 IM SUSY
0.5000 mL | PREFILLED_SYRINGE | Freq: Once | INTRAMUSCULAR | Status: AC
  Administered 2023-09-28: 0.5 mL via INTRAMUSCULAR
  Filled 2023-09-28: qty 0.5

## 2023-09-28 MED ORDER — LIDOCAINE-EPINEPHRINE (PF) 2 %-1:200000 IJ SOLN
10.0000 mL | Freq: Once | INTRAMUSCULAR | Status: AC
  Administered 2023-09-28: 10 mL
  Filled 2023-09-28: qty 20

## 2023-09-28 NOTE — ED Triage Notes (Signed)
Pt to triage via self ambulation c/o GLF with abrasions to right side forehead, right ribs and left hand. Pt states she fell while chasing her dog. Pt denies LOC or Thinners. VSS NAD PT on room air. Pt a/o x 4 bleeding controlled at this time.

## 2023-09-28 NOTE — Discharge Instructions (Signed)
The stitches can come out in about 5 days. 

## 2023-09-28 NOTE — ED Notes (Signed)
Patient is being discharged from the Urgent Care and sent to the Emergency Department via private vehicle . Per Rising PA, patient is in need of higher level of care due to facial laceration & head injury today. Patient is aware and verbalizes understanding of plan of care.  Vitals:   09/28/23 1953  BP: 127/73  Pulse: 76  Resp: 16  Temp: 97.6 F (36.4 C)  SpO2: 97%

## 2023-09-28 NOTE — ED Provider Notes (Signed)
[place] EMERGENCY DEPARTMENT AT [facility] Provider Note   CSN: [id number] Arrival date & time: 09/28/23  2023     History  Chief Complaint  Patient presents with   [name]    [name] is a 67 y.o. female.   Fall  Patient presents with fall.  Was chasing after her dog that ran away and fell and hit her face.  No loss conscious but does have laceration.  Also hit right chest.  Seen in urgent care and sent here.    Past Medical History:  Diagnosis Date   Asthma    Back pain    Chronic fatigue syndrome    Dr. Hyacinth Meeker   Complication of anesthesia    Cough    /SOB, PFT's nl, improved with Bronchodilation 8/11   Dysrhythmia    SVT   Endometrial cancer (HCC)    Esophageal spasm    GERD Dr. Hyacinth Meeker, Dr. Laural Benes; egd 2006 nl; UGI 3/13 Small HH, moderate GERD, nl motility; seen at [facility] ([place]), egd? neg, sone improvement on sucralfate susp   Fibromyalgia    Foot pain    Dr. Netta Corrigan   Hashimoto's thyroiditis    High triglycerides    Hypothyroidism    Dr. Wynne Dust 4/14   Leukocytoclastic vasculitis (HCC)    Dr. Danella Deis   Low HDL (under 40)    Mitral valve prolapse    Paroxysmal atrial tachycardia (HCC)    Dr. Anne Fu   Patent foramen ovale    Pernicious anemia    Dr. Hyacinth Meeker   PONV (postoperative nausea and vomiting)    along time ago had nausea ans vomiting after surgery-25 years ago   Reflux    ? delayed gastric emptying--GES nl 01/2012 (6% at 2hrs)   RLS (restless legs syndrome)    low ferritin Dr. Hyacinth Meeker   Rosacea    Sleep apnea    Uterine carcinoma (HCC)    Dr. Clifton James    Home Medications Prior to Admission medications   Medication Sig Start Date End Date Taking? Authorizing Provider  acetaminophen (TYLENOL) 500 MG tablet Take 2 tablets (1,000 mg total) by mouth every 6 (six) hours as needed. 06/14/18   Barnetta Chapel, PA-C  albuterol (PROVENTIL HFA;VENTOLIN HFA) 108 (90 Base) MCG/ACT inhaler Inhale 2 puffs into the lungs every 4 (four) hours  as needed for wheezing or shortness of breath. 06/13/16   [provider]  ARIPiprazole (ABILIFY) 2 MG tablet TAKE 1 TABLET BY MOUTH DAILY 06/04/23   [name], MD  Ascorbic Acid (VITAMIN C PO) Take 1 tablet by mouth daily.    [provider]  aspirin EC 81 MG tablet Take 81 mg by mouth at bedtime.    [provider]  atorvastatin (LIPITOR) 10 MG tablet Take 1 tablet (10 mg total) by mouth daily. 08/15/23   Jake Bathe, MD  buPROPion (WELLBUTRIN XL) 150 MG 24 hr tablet TAKE 3 TABLETS BY MOUTH DAILY 09/21/23   Venita Sheffield, MD  Cholecalciferol (VITAMIN D-3 PO) Take 1 capsule by mouth daily.    [provider]  cyanocobalamin (VITAMIN B12) 1000 MCG/ML injection Inject 1 mL (1,000 mcg total) into the muscle every 30 (thirty) days. 11/29/22   [name], MD  docusate sodium (COLACE) 100 MG capsule Take 1 capsule (100 mg total) by mouth 2 (two) times daily. While taking narcotic pain medicine. 01/04/23   Jacinta Shoe, PA-C  doxycycline (VIBRA-TABS) 100 MG tablet Take 100 mg by mouth 2 (  two) times daily. 07/18/23   [provider]  EPINEPHrine 0.3 mg/0.3 mL IJ SOAJ injection Inject 0.3 mg into the muscle as needed for anaphylaxis.    [provider]  ipratropium (ATROVENT) 0.06 % nasal spray Place 2 sprays into both nostrils 2 (two) times daily as needed for rhinitis. 01/02/20   [provider]  ketotifen (ZADITOR) 0.025 % ophthalmic solution Place 1 drop into both eyes daily.    [provider]  levocetirizine (XYZAL) 5 MG tablet SMARTSIG:1 Tablet(s) By Mouth Every Evening 08/13/23   [provider]  levothyroxine (SYNTHROID) 150 MCG tablet Take 150 mcg by mouth daily. 08/05/23   [provider]  metoprolol succinate (TOPROL-XL) 25 MG 24 hr tablet Take 1 tablet (25 mg total) by mouth daily. 08/15/23   Jake Bathe, MD  naproxen sodium (ALEVE) 220 MG tablet Take 220 mg by mouth daily as needed  (general pain, headache).    [provider]  nitroGLYCERIN (NITROSTAT) 0.4 MG SL tablet Place 1 tablet (0.4 mg total) under the tongue every 5 (five) minutes as needed for chest pain. 02/09/22   Orbie Pyo, MD  omalizumab Geoffry Paradise) 150 MG injection 300 mg every 30 (thirty) days. Every 29th or 30th    [provider]  pantoprazole (PROTONIX) 40 MG tablet Take 40 mg by mouth 2 (two) times daily. 08/09/20   [provider]  polyethylene glycol (MIRALAX / GLYCOLAX) 17 g packet Take 17 g by mouth daily as needed (constipation).    [provider]  pramipexole (MIRAPEX) 0.125 MG tablet Take 3 tablets (0.375 mg total) by mouth at bedtime as needed (restless legs syndrome). 02/27/22   [name], MD  Probiotic Product (PROBIOTIC PO) Take 1 capsule by mouth daily.    [provider]  Dwyane Luo 200-62.5-25 MCG/ACT AEPB Inhale 1 puff into the lungs at bedtime. 05/22/23   [provider]      Allergies    Asa [aspirin], Bee venom, Peanut-containing drug products, Ambien [zolpidem tartrate], Tape, Cipro [ciprofloxacin hcl], Latex, Minocycline, and Shellfish allergy    Review of Systems   Review of Systems  Physical Exam Updated Vital Signs BP (!) 132/53   Pulse 60   Temp 97.9 F (36.6 C)   Resp 14   Wt 67.9 kg   SpO2 100%   BMI 24.91 kg/m  Physical Exam Vitals reviewed.  HENT:     Head:     Comments: Laceration to right eyebrow area.  Approximately 3 cm long. Eyes:     Extraocular Movements: Extraocular movements intact.     Pupils: Pupils are equal, round, and reactive to light.  Cardiovascular:     Rate and Rhythm: Normal rate.  Pulmonary:     Comments: Tenderness right lateral chest wall.  No crepitance no deformity. Chest:     Chest wall: Tenderness present.  Abdominal:     Tenderness: There is no abdominal tenderness.  Musculoskeletal:        General: No tenderness.  Skin:    General: Skin is warm.   Neurological:     Mental Status: She is alert and oriented to person, place, and time.     ED Results / Procedures / Treatments   Labs (all labs ordered are listed, but only abnormal results are displayed) Labs Reviewed - No data to display  EKG None  Radiology DG Ribs Unilateral W/Chest Right  Result Date: 09/28/2023 CLINICAL DATA:  Status post fall with subsequent anterior  lower right rib pain. EXAM: RIGHT RIBS AND CHEST - 3+ VIEW COMPARISON:  None Available. FINDINGS: A radiopaque marker was placed at the site of the patient's pain. No fracture or other bone lesions are seen involving the ribs. Postoperative changes are seen within the visualized portion of the lower cervical spine. There is no evidence of pneumothorax or pleural effusion. Both lungs are clear. Heart size and mediastinal contours are within normal limits. Radiopaque surgical clips are seen within the right upper quadrant. IMPRESSION: 1. No acute rib fracture. 2. No acute cardiopulmonary disease. Electronically Signed   By: Aram Candela M.D.   On: 09/28/2023 21:43   CT Head Wo Contrast  Result Date: 09/28/2023 CLINICAL DATA:  Status post fall. EXAM: CT HEAD WITHOUT CONTRAST TECHNIQUE: Contiguous axial images were obtained from the base of the skull through the vertex without intravenous contrast. RADIATION DOSE REDUCTION: This exam was performed according to the departmental dose-optimization program which includes automated exposure control, adjustment of the mA and/or kV according to patient size and/or use of iterative reconstruction technique. COMPARISON:  None Available. FINDINGS: Brain: There is mild cerebral atrophy with widening of the extra-axial spaces and ventricular dilatation. There are areas of decreased attenuation within the white matter tracts of the supratentorial brain, consistent with microvascular disease changes. Vascular: No hyperdense vessel or unexpected calcification. Skull: Normal. Negative  for fracture or focal lesion. Sinuses/Orbits: No acute finding. Other: There is mild right frontal and right supra orbital scalp soft tissue swelling. An associated scalp soft tissue defect is noted. IMPRESSION: 1. Mild right frontal and right supra orbital scalp soft tissue swelling without evidence of an acute fracture or acute intracranial abnormality. 2. Generalized cerebral atrophy with chronic white matter small vessel ischemic changes. Electronically Signed   By: Aram Candela M.D.   On: 09/28/2023 21:40    Procedures .Laceration Repair  Date/Time: 09/28/2023 10:24 PM  Performed by: Benjiman Core, MD Authorized by: Benjiman Core, MD   Consent:    Consent obtained:  Verbal   Consent given by:  Patient Universal protocol:    Patient identity confirmed:  Verbally with patient Anesthesia:    Anesthesia method:  Local infiltration   Local anesthetic:  Lidocaine 2% WITH epi Laceration details:    Location:  Face   Face location:  R eyebrow   Length (cm):  3 Pre-procedure details:    Preparation:  Patient was prepped and draped in usual sterile fashion Exploration:    Limited defect created (wound extended): no     Hemostasis achieved with:  Epinephrine Treatment:    Area cleansed with:  Shur-Clens   Amount of cleaning:  Standard   Debridement:  None Skin repair:    Repair method:  Sutures   Suture size:  5-0   Suture material:  Prolene   Suture technique:  Simple interrupted   Number of sutures: 8 superficial. 1 Deep. Approximation:    Approximation:  Close Repair type:    Repair type:  Intermediate Post-procedure details:    Procedure completion:  Tolerated well, no immediate complications     Medications Ordered in ED Medications  lidocaine-EPINEPHrine (XYLOCAINE W/EPI) 2 %-1:200000 (PF) injection 10 mL (10 mLs Infiltration Given 09/28/23 2115)  Tdap (BOOSTRIX) injection 0.5 mL (0.5 mLs Intramuscular Given 09/28/23 2113)    ED Course/ Medical Decision  Making/ A&P  Medical Decision Making Amount and/or Complexity of Data Reviewed Radiology: ordered.  Risk Prescription drug management.   Patient with fall.  Eyebrow laceration.  Differential diagnosis includes intracranial hemorrhage.  Cervical spine clinically cleared.  No midline tenderness.  Does have some mild chest tenderness.  Rib films done and reassuring.  No abdominal tenderness.  Doubt intra-abdominal injury such as liver laceration.  Laceration closed.  Will discharge home.        Final Clinical Impression(s) / ED Diagnoses Final diagnoses:  Fall, initial encounter  Blunt chest trauma, initial encounter  Facial laceration, initial encounter    Rx / DC Orders ED Discharge Orders     None         Benjiman Core, MD 09/28/23 2359

## 2023-09-28 NOTE — ED Triage Notes (Signed)
Pt presents after falling while chasing her dog today. She hit head on sidewalk and has laceration above right eyebrow and abrasion on left hand.

## 2023-09-28 NOTE — ED Provider Notes (Signed)
67 year old woman fell while chasing her dog. Reports landed face first. Hit her right forehead on the sidewalk. Was reporting dizziness. She did not lose consciousness.  Not anti-coagulated.  Redirected to the ED for imaging    [name], Lurena Joiner, Cordelia Poche 09/28/23 2005

## 2023-10-01 DIAGNOSIS — L501 Idiopathic urticaria: Secondary | ICD-10-CM | POA: Diagnosis not present

## 2023-10-03 ENCOUNTER — Encounter: Payer: Self-pay | Admitting: Sports Medicine

## 2023-10-03 ENCOUNTER — Ambulatory Visit (INDEPENDENT_AMBULATORY_CARE_PROVIDER_SITE_OTHER): Payer: Medicare Other | Admitting: Sports Medicine

## 2023-10-03 VITALS — BP 122/74 | HR 70 | Temp 97.2°F | Resp 16 | Ht 65.0 in | Wt 153.6 lb

## 2023-10-03 DIAGNOSIS — Z4802 Encounter for removal of sutures: Secondary | ICD-10-CM | POA: Diagnosis not present

## 2023-10-03 DIAGNOSIS — W19XXXS Unspecified fall, sequela: Secondary | ICD-10-CM | POA: Diagnosis not present

## 2023-10-03 NOTE — Progress Notes (Signed)
Careteam: Patient Care Team: Venita Sheffield, MD as PCP - General (Internal Medicine) Jake Bathe, MD as PCP - Cardiology (Cardiology) Glyn Ade, PA-C as Physician Assistant (Dermatology) Eileen Stanford, MD as Referring Physician (Allergy and Immunology) Bernette Redbird, MD as Consulting Physician (Gastroenterology) Axel Filler, MD as Consulting Physician (General Surgery) Venita Lick, MD as Consulting Physician (Orthopedic Surgery) Inis Sizer, MD (Internal Medicine) Charlsie Merles Kirstie Peri, DPM as Consulting Physician (Podiatry) Dorisann Frames, MD as Referring Physician (Endocrinology) Essie Hart, MD (Inactive) as Referring Physician (Obstetrics and Gynecology) Golda Acre, OD (Optometry) Barron Alvine, MD (Inactive) (Urology) Krystal Clark, MD as Referring Physician (Rheumatology)  PLACE OF SERVICE:  [facility]  Advanced Directive information Does Patient Have a Medical Advance Directive?: Yes, Type of Advance Directive: Healthcare Power of Velma;Living will;Out of facility DNR (pink MOST or yellow form), Does patient want to make changes to medical advance directive?: No - Patient declined  Allergies  Allergen Reactions   Asa [Aspirin] Anaphylaxis    325mg  or higher Pt ok with 81mg  daily dose   Bee Venom Shortness Of Breath, Itching, Swelling and Hives   Peanut-Containing Drug Products Anaphylaxis and Other (See Comments)   Ambien [Zolpidem Tartrate] Other (See Comments)    Hallucinations   Tape Itching, Swelling and Rash   Cipro [Ciprofloxacin Hcl] Nausea And Vomiting   Latex Rash   Minocycline Rash   Shellfish Allergy Rash    Chief Complaint  Patient presents with   Procedure    Patient needs stitches removed.      HPI: Patient is a 67 y.o. female is here for suture removal  Pt reports that she was taking her dog for a walk and trip and fell over the curb Went to ED Ct head  MPRESSION: 1. Mild right frontal and right supra  orbital scalp soft tissue swelling without evidence of an acute fracture or acute intracranial abnormality. 2. Generalized cerebral atrophy with chronic white matter small vessel ischemic changes. Denies headache , nausea, vomiting, dizzy or lightheadedness   Review of Systems:  Review of Systems  Constitutional:  Negative for chills and fever.  HENT:  Negative for congestion and sore throat.   Eyes:  Negative for double vision.  Respiratory:  Negative for cough, sputum production and shortness of breath.   Cardiovascular:  Negative for chest pain, palpitations and leg swelling.  Gastrointestinal:  Negative for abdominal pain, heartburn and nausea.  Genitourinary:  Negative for dysuria, frequency and hematuria.  Musculoskeletal:  Positive for falls. Negative for myalgias.  Neurological:  Negative for dizziness, sensory change, focal weakness and headaches.   Negative unless indicated in HPI.   Past Medical History:  Diagnosis Date   Asthma    Back pain    Chronic fatigue syndrome    Dr. Hyacinth Meeker   Complication of anesthesia    Cough    /SOB, PFT's nl, improved with Bronchodilation 8/11   Dysrhythmia    SVT   Endometrial cancer (HCC)    Esophageal spasm    GERD Dr. Hyacinth Meeker, Dr. Laural Benes; egd 2006 nl; UGI 3/13 Small HH, moderate GERD, nl motility; seen at [facility] ([place]), egd? neg, sone improvement on sucralfate susp   Fibromyalgia    Foot pain    Dr. Netta Corrigan   Hashimoto's thyroiditis    High triglycerides    Hypothyroidism    Dr. Wynne Dust 4/14   Leukocytoclastic vasculitis (HCC)    Dr. Danella Deis   Low HDL (under 40)    Mitral valve prolapse  Paroxysmal atrial tachycardia (HCC)    Dr. Anne Fu   Patent foramen ovale    Pernicious anemia    Dr. Hyacinth Meeker   PONV (postoperative nausea and vomiting)    along time ago had nausea ans vomiting after surgery-25 years ago   Reflux    ? delayed gastric emptying--GES nl 01/2012 (6% at 2hrs)   RLS (restless legs syndrome)    low  ferritin Dr. Hyacinth Meeker   Rosacea    Sleep apnea    Uterine carcinoma [facility])    Dr. Clifton James   Past Surgical History:  Procedure Laterality Date   ABDOMINAL EXPOSURE N/A 12/07/2021   Procedure: ABDOMINAL EXPOSURE;  Surgeon: Cephus Shelling, MD;  Location: [facility] OR;  Service: Vascular;  Laterality: N/A;   ABDOMINAL HYSTERECTOMY     bladder tack     x 2   BUNIONECTOMY     CALCANEAL OSTEOTOMY Right 01/04/2023   Procedure: CALCANEAL OSTEOTOMY;  Surgeon: Toni Arthurs, MD;  Location: [facility];  Service: Orthopedics;  Laterality: Right;   CHOLECYSTECTOMY  2009   COLONOSCOPY     scr, 12/2008 ([facility]) nl   COLOSTOMY Left 06/05/2018   Procedure: COLOSTOMY;  Surgeon: Axel Filler, MD;  Location[facility] OR;  Service: General;  Laterality: Left;   COLOSTOMY REVERSAL N/A 10/16/2018   Procedure: HARTMANN'S COLOSTOMY REVERSAL, RIGID PROCTOSCOPY;  Surgeon: Axel Filler, MD;  Location: [facility] ORS;  Service: General;  Laterality: N/A;   GASTROCNEMIUS RECESSION Right 01/04/2023   Procedure: GASTROCNEMIUS recession;  Surgeon: Toni Arthurs, MD;  Location: [facility];  Service: Orthopedics;  Laterality: Right;   HAMMER TOE SURGERY     HYSTERECTOMY ABDOMINAL WITH SALPINGO-OOPHORECTOMY  2008   INCISIONAL HERNIA REPAIR  04/09/2019   x2   INCISIONAL HERNIA REPAIR N/A 04/09/2019   Procedure: INCISIONAL HERNIA REPAIR WITH MESH;  Surgeon: Axel Filler, MD;  Location: [facility] OR;  Service: General;  Laterality: N/A;   LAPAROSCOPY N/A 10/16/2018   Procedure: LAPAROSCOPY DIAGNOSTIC WITH LYSIS OF ADHESIONS;  Surgeon: Axel Filler, MD;  Location: [facility];  Service: General;  Laterality: N/A;   LUNG BIOPSY     L Lower lung pulm nodules, largest 4.30mm, low risk, repeat CT in 1 yr now following with pulm at [facility]; 9/13 Stable tiny lung nodules, no f/u needed   LYMPH NODE DISSECTION  2008   LYSIS OF ADHESION N/A 04/09/2019   Procedure: OPEN LYSIS OF ADHESION;  Surgeon: Axel Filler, MD;   Location: [facility] OR;  Service: General;  Laterality: N/A;   METATARSAL OSTEOTOMY     NECK SURGERY     x 2   OBLIQUE LUMBAR INTERBODY FUSION 1 LEVEL WITH PERCUTANEOUS SCREWS N/A 12/07/2021   Procedure: OBLIQUE LUMBAR INTERBODY FUSION 1 LEVEL WITH PERCUTANEOUS SCREWS (OLIF L4-5 WITH POSTERIOR SPINAL FUSION AND PEDICLE SCREWS);  Surgeon: Venita Lick, MD;  Location: [facility] OR;  Service: Orthopedics;  Laterality: N/A;  4 HRS DR. CLARK TO DO APPROACH LEFT TAP BLOCK WITH EXPAREL 3 C-BED   PARTIAL COLECTOMY N/A 06/05/2018   Procedure: EXPLORATORY LAPAROTOMY AND PARTIAL COLECTOMY;  Surgeon: Axel Filler, MD;  Location: [facility] OR;  Service: General;  Laterality: N/A;   POSTERIOR TIBIAL TENDON REPAIR Right 01/04/2023   Procedure: POSTERIOR TIBIAL TENDON TENOLYSIS AND  flexor digitorum longus TRANSFER TO NAVICULAR;  Surgeon: Toni Arthurs, MD;  Location: [facility];  Service: Orthopedics;  Laterality: Right;   TONSILLECTOMY     US ECHOCARDIOGRAPHY     with Nuclear test, Dr. Anne Fu, Low  risk   VESICOVAGINAL FISTULA CLOSURE W/ TAH     Social History:   reports that she has never smoked. She has never used smokeless tobacco. She reports current alcohol use. She reports that she does not use drugs.  Family History  Problem Relation Age of Onset   Hashimoto's thyroiditis Mother    High blood pressure Mother    Stroke Mother    Other Mother        Borderline DM   Alzheimer's disease Mother    Ulcerative colitis Father    Meniere's disease Father    Hearing loss Father    Stroke Father    Stroke Sister     Medications: Patient's Medications  New Prescriptions   No medications on file  Previous Medications   ACETAMINOPHEN (TYLENOL) 500 MG TABLET    Take 2 tablets (1,000 mg total) by mouth every 6 (six) hours as needed.   ALBUTEROL (PROVENTIL HFA;VENTOLIN HFA) 108 (90 BASE) MCG/ACT INHALER    Inhale 2 puffs into the lungs every 4 (four) hours as needed for wheezing or shortness of breath.    ARIPIPRAZOLE (ABILIFY) 2 MG TABLET    TAKE 1 TABLET BY MOUTH DAILY   ASCORBIC ACID (VITAMIN C PO)    Take 1 tablet by mouth daily.   ASPIRIN EC 81 MG TABLET    Take 81 mg by mouth at bedtime.   ATORVASTATIN (LIPITOR) 10 MG TABLET    Take 1 tablet (10 mg total) by mouth daily.   BUPROPION (WELLBUTRIN XL) 150 MG 24 HR TABLET    TAKE 3 TABLETS BY MOUTH DAILY   CHOLECALCIFEROL (VITAMIN D-3 PO)    Take 1 capsule by mouth daily.   CYANOCOBALAMIN (VITAMIN B12) 1000 MCG/ML INJECTION    Inject 1 mL (1,000 mcg total) into the muscle every 30 (thirty) days.   DOCUSATE SODIUM (COLACE) 100 MG CAPSULE    Take 1 capsule (100 mg total) by mouth 2 (two) times daily. While taking narcotic pain medicine.   DOXYCYCLINE (VIBRA-TABS) 100 MG TABLET    Take 100 mg by mouth 2 (two) times daily.   EPINEPHRINE 0.3 MG/0.3 ML IJ SOAJ INJECTION    Inject 0.3 mg into the muscle as needed for anaphylaxis.   IPRATROPIUM (ATROVENT) 0.06 % NASAL SPRAY    Place 2 sprays into both nostrils 2 (two) times daily as needed for rhinitis.   KETOTIFEN (ZADITOR) 0.025 % OPHTHALMIC SOLUTION    Place 1 drop into both eyes daily.   LEVOCETIRIZINE (XYZAL) 5 MG TABLET    SMARTSIG:1 Tablet(s) By Mouth Every Evening   LEVOTHYROXINE (SYNTHROID) 150 MCG TABLET    Take 150 mcg by mouth daily.   METOPROLOL SUCCINATE (TOPROL-XL) 25 MG 24 HR TABLET    Take 1 tablet (25 mg total) by mouth daily.   NAPROXEN SODIUM (ALEVE) 220 MG TABLET    Take 220 mg by mouth daily as needed (general pain, headache).   NITROGLYCERIN (NITROSTAT) 0.4 MG SL TABLET    Place 1 tablet (0.4 mg total) under the tongue every 5 (five) minutes as needed for chest pain.   OMALIZUMAB (XOLAIR) 150 MG INJECTION    300 mg every 30 (thirty) days. Every 29th or 30th   PANTOPRAZOLE (PROTONIX) 40 MG TABLET    Take 40 mg by mouth 2 (two) times daily.   POLYETHYLENE GLYCOL (MIRALAX / GLYCOLAX) 17 G PACKET    Take 17 g by mouth daily as needed (constipation).   PRAMIPEXOLE (MIRAPEX) 0.125 MG  TABLET    Take 3 tablets (0.375 mg total) by mouth at bedtime as needed (restless legs syndrome).   PROBIOTIC PRODUCT (PROBIOTIC PO)    Take 1 capsule by mouth daily.   TRELEGY ELLIPTA 200-62.5-25 MCG/ACT AEPB    Inhale 1 puff into the lungs at bedtime.  Modified Medications   No medications on file  Discontinued Medications   No medications on file    Physical Exam: Vitals:   10/03/23 1422  BP: 122/74  Pulse: 70  Resp: 16  Temp: (!) 97.2 F (36.2 C)  SpO2: 96%  Weight: 153 lb 9.6 oz (69.7 kg)  Height: 5\' 5"  (1.651 m)   Body mass index is 25.56 kg/m. BP Readings from Last 3 Encounters:  10/03/23 122/74  09/28/23 (!) 132/53  09/28/23 127/73   Wt Readings from Last 3 Encounters:  10/03/23 153 lb 9.6 oz (69.7 kg)  09/28/23 149 lb 11.1 oz (67.9 kg)  08/28/23 149 lb 12.8 oz (67.9 kg)    Physical Exam Constitutional:      Appearance: Normal appearance.  HENT:     Head: Normocephalic.     Comments: Rt fore head laceration covered with sutures No gaping  Swollen Mild tenderness on palpation  Cardiovascular:     Rate and Rhythm: Normal rate and regular rhythm.  Pulmonary:     Effort: Pulmonary effort is normal. No respiratory distress.     Breath sounds: Normal breath sounds. No wheezing.  Abdominal:     General: Bowel sounds are normal. There is no distension.     Tenderness: There is no abdominal tenderness. There is no guarding or rebound.     Comments:    Musculoskeletal:        General: No swelling or tenderness.  Neurological:     Mental Status: She is alert. Mental status is at baseline.     Sensory: No sensory deficit.     Motor: No weakness.     Labs reviewed: Basic Metabolic Panel: Recent Labs    06/13/23 1538 06/14/23 0130 06/15/23 0326 06/16/23 0353  NA 139 139 138 137  K 4.3 4.2 3.8 3.6  CL 105 104 100 100  CO2 26 27 24 23   GLUCOSE 91 88 55* 69*  BUN 12 14 13  6*  CREATININE 0.90 0.92 0.92 0.88  CALCIUM 9.3 9.1 9.1 8.6*  MG 2.2  --  2.0  1.8  PHOS 3.8  --   --   --    Liver Function Tests: Recent Labs    06/13/23 1005 06/13/23 1538 06/14/23 0130  AST 22 19 17   ALT 16 14 14   ALKPHOS 98 86 83  BILITOT 0.7 0.8 1.1  PROT 7.0 6.7 6.8  ALBUMIN 4.0 3.8 3.7   Recent Labs    06/13/23 1005  LIPASE 33   No results for input(s): "AMMONIA" in the last 8760 hours. CBC: Recent Labs    06/13/23 1005 06/13/23 1013 06/14/23 0130 06/15/23 0818 06/16/23 0353  WBC 13.9*   < > 13.0* 9.7 10.1  NEUTROABS 11.4*  --   --   --   --   HGB 14.5   < > 13.7 12.9 12.0  HCT 44.5   < > 41.2 38.3 34.6*  MCV 94.1   < > 96.9 95.5 91.3  PLT 402*   < > 373 326 327   < > = values in this interval not displayed.   Lipid Panel: Recent Labs    09/06/23 0921  CHOL 134  HDL 38*  LDLCALC 70  TRIG 536  CHOLHDL 3.5   TSH: No results for input(s): "TSH" in the last 8760 hours. A1C: Lab Results  Component Value Date   HGBA1C 5.0 06/13/2023     Assessment/Plan 1. Visit for suture removal Rt fore head  cleaned with alcohol swab  Sutures removed No gaping  No drainage Instructed patient to use bacitracin   2. Fall, sequela Denies headache, nausea, vomiting, dizzy or lightheadedness  No follow-ups on file.:

## 2023-10-10 ENCOUNTER — Encounter: Payer: Medicare Other | Admitting: Sports Medicine

## 2023-10-30 ENCOUNTER — Other Ambulatory Visit: Payer: Self-pay | Admitting: Cardiology

## 2023-11-01 DIAGNOSIS — L501 Idiopathic urticaria: Secondary | ICD-10-CM | POA: Diagnosis not present

## 2023-11-28 ENCOUNTER — Ambulatory Visit: Payer: Medicare Other | Admitting: Family Medicine

## 2023-11-28 ENCOUNTER — Ambulatory Visit (INDEPENDENT_AMBULATORY_CARE_PROVIDER_SITE_OTHER): Payer: Medicare Other | Admitting: Sports Medicine

## 2023-11-28 ENCOUNTER — Encounter: Payer: Self-pay | Admitting: Sports Medicine

## 2023-11-28 VITALS — BP 124/66 | HR 66 | Temp 97.2°F | Resp 16 | Ht 65.0 in | Wt 149.0 lb

## 2023-11-28 DIAGNOSIS — Z23 Encounter for immunization: Secondary | ICD-10-CM | POA: Diagnosis not present

## 2023-11-28 DIAGNOSIS — G2581 Restless legs syndrome: Secondary | ICD-10-CM | POA: Diagnosis not present

## 2023-11-28 DIAGNOSIS — E039 Hypothyroidism, unspecified: Secondary | ICD-10-CM

## 2023-11-28 DIAGNOSIS — F411 Generalized anxiety disorder: Secondary | ICD-10-CM

## 2023-11-28 DIAGNOSIS — F322 Major depressive disorder, single episode, severe without psychotic features: Secondary | ICD-10-CM

## 2023-11-28 DIAGNOSIS — J4520 Mild intermittent asthma, uncomplicated: Secondary | ICD-10-CM

## 2023-11-28 DIAGNOSIS — K219 Gastro-esophageal reflux disease without esophagitis: Secondary | ICD-10-CM

## 2023-11-28 DIAGNOSIS — R5383 Other fatigue: Secondary | ICD-10-CM

## 2023-11-28 MED ORDER — PRAMIPEXOLE DIHYDROCHLORIDE 0.125 MG PO TABS: 0.3750 mg | ORAL_TABLET | Freq: Every evening | ORAL | 1 refills | Status: AC | PRN

## 2023-11-28 MED ORDER — VENLAFAXINE HCL ER 75 MG PO CP24: 75.0000 mg | ORAL_CAPSULE | Freq: Every day | ORAL | 0 refills | Status: DC

## 2023-11-28 NOTE — Progress Notes (Signed)
Careteam: Patient Care Team: Venita Sheffield, MD as PCP - General (Internal Medicine) Jake Bathe, MD as PCP - Cardiology (Cardiology) Glyn Ade, PA-C as Physician Assistant (Dermatology) Eileen Stanford, MD as Referring Physician (Allergy and Immunology) Bernette Redbird, MD as Consulting Physician (Gastroenterology) Axel Filler, MD as Consulting Physician (General Surgery) Venita Lick, MD as Consulting Physician (Orthopedic Surgery) Inis Sizer, MD (Internal Medicine) Charlsie Merles Kirstie Peri, DPM as Consulting Physician (Podiatry) Dorisann Frames, MD as Referring Physician (Endocrinology) Essie Hart, MD (Inactive) as Referring Physician (Obstetrics and Gynecology) Golda Acre, OD (Optometry) Barron Alvine, MD (Inactive) (Urology) Krystal Clark, MD as Referring Physician (Rheumatology) Mammography, [facility] (Diagnostic Radiology)  PLACE OF SERVICE:  [facility]  Advanced Directive information Does Patient Have a Medical Advance Directive?: Yes, Type of Advance Directive: Healthcare Power of Becker;Out of facility DNR (pink MOST or yellow form);Living will, Does patient want to make changes to medical advance directive?: No - Patient declined  Allergies  Allergen Reactions   Asa [Aspirin] Anaphylaxis    325mg  or higher Pt ok with 81mg  daily dose   Bee Venom Shortness Of Breath, Itching, Swelling and Hives   Peanut-Containing Drug Products Anaphylaxis and Other (See Comments)   Ambien [Zolpidem Tartrate] Other (See Comments)    Hallucinations   Tape Itching, Swelling and Rash   Cipro [Ciprofloxacin Hcl] Nausea And Vomiting   Latex Rash   Minocycline Rash   Shellfish Allergy Rash    Chief Complaint  Patient presents with   Medical Management of Chronic Issues    6 month follow up.    Immunizations    Discuss the need for Shingrix vaccine, Covid Booster, Pne vaccine.    Health Maintenance    Discuss the need for Dexa scan, and Mammogram.    Discussed the use of AI scribe software for clinical note transcription with the patient, who gave verbal consent to proceed.  History of Present Illness   The patient is a 68 year old female with anxiety and depression who presents with increased irritability and mood changes. She is accompanied by her husband, Annette Stable.  She experiences increased irritability and mood changes, which are atypical for her. Her husband has noticed these changes, describing her as not being herself lately. She attributes some of her irritability to the current political climate, expressing concerns about the country's situation. She feels angry most of the time and finds it difficult to stop worrying, particularly about national issues. She prays nightly about these concerns, which is a new experience for her. She has trouble relaxing daily and feels restless unless engaged in her [facility], which she does every day. She also reports being easily annoyed and irritable every day. She worries about potential negative impacts on her social security and Medicare, as she and her husband rely on these for their livelihood. She expresses concern about the possibility of federal funding cuts affecting these programs.  She has a history of depression and currently takes bupropion. She feels depressed most of the time and tries to maintain a positive demeanor despite her internal dialogue. She has lost interest in activities and experiences difficulty sleeping, often undersleeping due to trouble relaxing. Her energy levels are low most of the time, but her appetite remains stable as she is focused on maintaining her weight. She denies any thoughts of self-harm but feels she is not living up to her usual upbeat role in the family. She has experienced these symptoms for at least three months. She sometimes has difficulty concentrating,  especially on tasks that require significant focus.  She has asthma and uses Trelegy nightly, which  effectively manages her symptoms. She has not needed her rescue inhaler frequently and does not wake up at night to use it. No shortness of breath, chest pain, or palpitations.  Her current medications include aspirin 81 mg, aripiprazole, Lipitor, Wellbutrin, vitamin D, B12 injections, levocetirizine, levothyroxine, metoprolol, and Xolair. She takes Mirapex as needed for restless leg syndrome, typically three tablets at night if needed.         Review of Systems:  Review of Systems  Constitutional:  Negative for chills and fever.  HENT:  Negative for congestion and sore throat.   Eyes:  Negative for double vision.  Respiratory:  Negative for cough, sputum production and shortness of breath.   Cardiovascular:  Negative for chest pain, palpitations and leg swelling.  Gastrointestinal:  Negative for abdominal pain, heartburn and nausea.  Genitourinary:  Negative for dysuria, frequency and hematuria.  Musculoskeletal:  Negative for falls and myalgias.  Neurological:  Negative for dizziness, sensory change and focal weakness.  Psychiatric/Behavioral:  Positive for depression. Negative for suicidal ideas. The patient is nervous/anxious and has insomnia.    Negative unless indicated in HPI.   Past Medical History:  Diagnosis Date   Asthma    Back pain    Chronic fatigue syndrome    Dr. Hyacinth Meeker   Complication of anesthesia    Cough    /SOB, PFT's nl, improved with Bronchodilation 8/11   Dysrhythmia    SVT   Endometrial cancer (HCC)    Esophageal spasm    GERD Dr. Hyacinth Meeker, Dr. Laural Benes; egd 2006 nl; UGI 3/13 Small HH, moderate GERD, nl motility; seen at [facility] ([place]), egd? neg, sone improvement on sucralfate susp   Fibromyalgia    Foot pain    Dr. Netta Corrigan   Hashimoto's thyroiditis    High triglycerides    Hypothyroidism    Dr. Wynne Dust 4/14   Leukocytoclastic vasculitis (HCC)    Dr. Danella Deis   Low HDL (under 40)    Mitral valve prolapse    Paroxysmal atrial tachycardia (HCC)     Dr. Anne Fu   Patent foramen ovale    Pernicious anemia    Dr. Hyacinth Meeker   PONV (postoperative nausea and vomiting)    along time ago had nausea ans vomiting after surgery-25 years ago   Reflux    ? delayed gastric emptying--GES nl 01/2012 (6% at 2hrs)   RLS (restless legs syndrome)    low ferritin Dr. Hyacinth Meeker   Rosacea    Sleep apnea    Uterine carcinoma (HCC)    Dr. Clifton James   Past Surgical History:  Procedure Laterality Date   ABDOMINAL EXPOSURE N/A 12/07/2021   Procedure: ABDOMINAL EXPOSURE;  Surgeon: Cephus Shelling, MD;  Location[facility] OR;  Service: Vascular;  Laterality: N/A;   ABDOMINAL HYSTERECTOMY     bladder tack     x 2   BUNIONECTOMY     CALCANEAL OSTEOTOMY Right 01/04/2023   Procedure: CALCANEAL OSTEOTOMY;  Surgeon: Toni Arthurs, MD;  Location: [facility];  Service: Orthopedics;  Laterality: Right;   CHOLECYSTECTOMY  2009   COLONOSCOPY     scr, 12/2008 ([facility]) nl   COLOSTOMY Left 06/05/2018   Procedure: COLOSTOMY;  Surgeon: Axel Filler, MD;  Location: [facility] OR;  Service: General;  Laterality: Left;   COLOSTOMY REVERSAL N/A 10/16/2018   Procedure: HARTMANN'S COLOSTOMY REVERSAL, RIGID PROCTOSCOPY;  Surgeon: Axel Filler, MD;  Location: [facility];  Service: General;  Laterality: N/A;   GASTROCNEMIUS RECESSION Right 01/04/2023   Procedure: GASTROCNEMIUS recession;  Surgeon: Toni Arthurs, MD;  Location: [facility];  Service: Orthopedics;  Laterality: Right;   HAMMER TOE SURGERY     HYSTERECTOMY ABDOMINAL WITH SALPINGO-OOPHORECTOMY  2008   INCISIONAL HERNIA REPAIR  04/09/2019   x2   INCISIONAL HERNIA REPAIR N/A 04/09/2019   Procedure: INCISIONAL HERNIA REPAIR WITH MESH;  Surgeon: Axel Filler, MD;  Location: [facility] OR;  Service: General;  Laterality: N/A;   LAPAROSCOPY N/A 10/16/2018   Procedure: LAPAROSCOPY DIAGNOSTIC WITH LYSIS OF ADHESIONS;  Surgeon: Axel Filler, MD;  Location: [facility] ORS;  Service: General;  Laterality: N/A;   LUNG BIOPSY      L Lower lung pulm nodules, largest 4.62mm, low risk, repeat CT in 1 yr now following with pulm at [facility]; 9/13 Stable tiny lung nodules, no f/u needed   LYMPH NODE DISSECTION  2008   LYSIS OF ADHESION N/A 04/09/2019   Procedure: OPEN LYSIS OF ADHESION;  Surgeon: Axel Filler, MD;  Location: [facility] OR;  Service: General;  Laterality: N/A;   METATARSAL OSTEOTOMY     NECK SURGERY     x 2   OBLIQUE LUMBAR INTERBODY FUSION 1 LEVEL WITH PERCUTANEOUS SCREWS N/A 12/07/2021   Procedure: OBLIQUE LUMBAR INTERBODY FUSION 1 LEVEL WITH PERCUTANEOUS SCREWS (OLIF L4-5 WITH POSTERIOR SPINAL FUSION AND PEDICLE SCREWS);  Surgeon: Venita Lick, MD;  Location: [facility] OR;  Service: Orthopedics;  Laterality: N/A;  4 HRS DR. CLARK TO DO APPROACH LEFT TAP BLOCK WITH EXPAREL 3 C-BED   PARTIAL COLECTOMY N/A 06/05/2018   Procedure: EXPLORATORY LAPAROTOMY AND PARTIAL COLECTOMY;  Surgeon: Axel Filler, MD;  Location: [facility] OR;  Service: General;  Laterality: N/A;   POSTERIOR TIBIAL TENDON REPAIR Right 01/04/2023   Procedure: POSTERIOR TIBIAL TENDON TENOLYSIS AND  flexor digitorum longus TRANSFER TO NAVICULAR;  Surgeon: Toni Arthurs, MD;  Location: [facility];  Service: Orthopedics;  Laterality: Right;   TONSILLECTOMY     US ECHOCARDIOGRAPHY     with Nuclear test, Dr. Anne Fu, Low risk   VESICOVAGINAL FISTULA CLOSURE W/ TAH     Social History:   reports that she has never smoked. She has never used smokeless tobacco. She reports current alcohol use. She reports that she does not use drugs.  Family History  Problem Relation Age of Onset   Hashimoto's thyroiditis Mother    High blood pressure Mother    Stroke Mother    Other Mother        Borderline DM   Alzheimer's disease Mother    Ulcerative colitis Father    Meniere's disease Father    Hearing loss Father    Stroke Father    Stroke Sister     Medications: Patient's Medications  New Prescriptions   No medications on file  Previous Medications    ACETAMINOPHEN (TYLENOL) 500 MG TABLET    Take 2 tablets (1,000 mg total) by mouth every 6 (six) hours as needed.   ALBUTEROL (PROVENTIL HFA;VENTOLIN HFA) 108 (90 BASE) MCG/ACT INHALER    Inhale 2 puffs into the lungs every 4 (four) hours as needed for wheezing or shortness of breath.   ARIPIPRAZOLE (ABILIFY) 2 MG TABLET    TAKE 1 TABLET BY MOUTH DAILY   ASCORBIC ACID (VITAMIN C PO)    Take 1 tablet by mouth daily.   ASPIRIN EC 81 MG TABLET    Take 81 mg by mouth at bedtime.  ATORVASTATIN (LIPITOR) 10 MG TABLET    TAKE 1 TABLET BY MOUTH DAILY   BUPROPION (WELLBUTRIN XL) 150 MG 24 HR TABLET    TAKE 3 TABLETS BY MOUTH DAILY   CHOLECALCIFEROL (VITAMIN D-3 PO)    Take 1 capsule by mouth daily.   CYANOCOBALAMIN (VITAMIN B12) 1000 MCG/ML INJECTION    Inject 1 mL (1,000 mcg total) into the muscle every 30 (thirty) days.   DOCUSATE SODIUM (COLACE) 100 MG CAPSULE    Take 1 capsule (100 mg total) by mouth 2 (two) times daily. While taking narcotic pain medicine.   DOXYCYCLINE (VIBRA-TABS) 100 MG TABLET    Take 100 mg by mouth 2 (two) times daily.   EPINEPHRINE 0.3 MG/0.3 ML IJ SOAJ INJECTION    Inject 0.3 mg into the muscle as needed for anaphylaxis.   IPRATROPIUM (ATROVENT) 0.06 % NASAL SPRAY    Place 2 sprays into both nostrils 2 (two) times daily as needed for rhinitis.   KETOTIFEN (ZADITOR) 0.025 % OPHTHALMIC SOLUTION    Place 1 drop into both eyes daily.   LEVOCETIRIZINE (XYZAL) 5 MG TABLET    SMARTSIG:1 Tablet(s) By Mouth Every Evening   LEVOTHYROXINE (SYNTHROID) 150 MCG TABLET    Take 150 mcg by mouth daily.   METOPROLOL SUCCINATE (TOPROL-XL) 25 MG 24 HR TABLET    Take 1 tablet (25 mg total) by mouth daily.   NAPROXEN SODIUM (ALEVE) 220 MG TABLET    Take 220 mg by mouth daily as needed (general pain, headache).   NITROGLYCERIN (NITROSTAT) 0.4 MG SL TABLET    Place 1 tablet (0.4 mg total) under the tongue every 5 (five) minutes as needed for chest pain.   OMALIZUMAB (XOLAIR) 150 MG INJECTION    300 mg  every 30 (thirty) days. Every 29th or 30th   PANTOPRAZOLE (PROTONIX) 40 MG TABLET    Take 40 mg by mouth 2 (two) times daily.   POLYETHYLENE GLYCOL (MIRALAX / GLYCOLAX) 17 G PACKET    Take 17 g by mouth daily as needed (constipation).   PRAMIPEXOLE (MIRAPEX) 0.125 MG TABLET    Take 3 tablets (0.375 mg total) by mouth at bedtime as needed (restless legs syndrome).   PROBIOTIC PRODUCT (PROBIOTIC PO)    Take 1 capsule by mouth daily.   TRELEGY ELLIPTA 200-62.5-25 MCG/ACT AEPB    Inhale 1 puff into the lungs at bedtime.  Modified Medications   No medications on file  Discontinued Medications   No medications on file    Physical Exam: Vitals:   11/28/23 1052  BP: 124/66  Pulse: 66  Resp: 16  Temp: (!) 97.2 F (36.2 C)  SpO2: 99%  Weight: 149 lb (67.6 kg)  Height: 5\' 5"  (1.651 m)   Body mass index is 24.79 kg/m. BP Readings from Last 3 Encounters:  11/28/23 124/66  10/03/23 122/74  09/28/23 (!) 132/53   Wt Readings from Last 3 Encounters:  11/28/23 149 lb (67.6 kg)  10/03/23 153 lb 9.6 oz (69.7 kg)  09/28/23 149 lb 11.1 oz (67.9 kg)    Physical Exam Constitutional:      Appearance: Normal appearance.  HENT:     Head: Normocephalic and atraumatic.  Cardiovascular:     Rate and Rhythm: Normal rate and regular rhythm.  Pulmonary:     Effort: Pulmonary effort is normal. No respiratory distress.     Breath sounds: Normal breath sounds. No wheezing.  Abdominal:     General: Bowel sounds are normal. There is no distension.  Tenderness: There is no abdominal tenderness. There is no guarding or rebound.     Comments:    Musculoskeletal:        General: No swelling or tenderness.  Neurological:     Mental Status: She is alert. Mental status is at baseline.     Sensory: No sensory deficit.     Motor: No weakness.     Labs reviewed: Basic Metabolic Panel: Recent Labs    06/13/23 1538 06/14/23 0130 06/15/23 0326 06/16/23 0353  NA 139 139 138 137  K 4.3 4.2 3.8 3.6   CL 105 104 100 100  CO2 26 27 24 23   GLUCOSE 91 88 55* 69*  BUN 12 14 13  6*  CREATININE 0.90 0.92 0.92 0.88  CALCIUM 9.3 9.1 9.1 8.6*  MG 2.2  --  2.0 1.8  PHOS 3.8  --   --   --    Liver Function Tests: Recent Labs    06/13/23 1005 06/13/23 1538 06/14/23 0130  AST 22 19 17   ALT 16 14 14   ALKPHOS 98 86 83  BILITOT 0.7 0.8 1.1  PROT 7.0 6.7 6.8  ALBUMIN 4.0 3.8 3.7   Recent Labs    06/13/23 1005  LIPASE 33   No results for input(s): "AMMONIA" in the last 8760 hours. CBC: Recent Labs    06/13/23 1005 06/13/23 1013 06/14/23 0130 06/15/23 0818 06/16/23 0353  WBC 13.9*   < > 13.0* 9.7 10.1  NEUTROABS 11.4*  --   --   --   --   HGB 14.5   < > 13.7 12.9 12.0  HCT 44.5   < > 41.2 38.3 34.6*  MCV 94.1   < > 96.9 95.5 91.3  PLT 402*   < > 373 326 327   < > = values in this interval not displayed.   Lipid Panel: Recent Labs    09/06/23 0921  CHOL 134  HDL 38*  LDLCALC 70  TRIG 161  CHOLHDL 3.5   TSH: No results for input(s): "TSH" in the last 8760 hours. A1C: Lab Results  Component Value Date   HGBA1C 5.0 06/13/2023     Assessment/Plan  1. GAD (generalized anxiety disorder) (Primary)  GAD -7  19 Will start effexor Follow up in 4 weeks - venlafaxine XR (EFFEXOR XR) 75 MG 24 hr capsule; Take 1 capsule (75 mg total) by mouth daily with breakfast.  Dispense: 30 capsule; Refill: 0  2. Current severe episode of major depressive disorder without psychotic features without prior episode (HCC) Phq9- 12 Will start effexor Cont with wellbutrin Follow up in 4 weeks  - venlafaxine XR (EFFEXOR XR) 75 MG 24 hr capsule; Take 1 capsule (75 mg total) by mouth daily with breakfast.  Dispense: 30 capsule; Refill: 0  3. Hypothyroidism, unspecified type  Will check TSH and adjust dose accordingly  - TSH  4. Gastroesophageal reflux disease, unspecified whether esophagitis present Denies bloody or dark stools Cont with protonix  5. Restless legs  Reports needing  to take Mirapex on an as-needed basis. -Refill Mirapex prescription and send to [facility] pharmacy.  6. Other fatigue   - Basic Metabolic Panel with eGFR - TSH  7. Need for pneumococcal 20-valent conjugate vaccination   - Pneumococcal conjugate vaccine 20-valent (Prevnar 20)  Asthma Well-controlled with nightly use of Trelegy and occasional use of Albuterol. -Continue current regimen.  Other orders - pramipexole (MIRAPEX) 0.125 MG tablet; Take 3 tablets (0.375 mg total) by mouth at bedtime as  needed (restless legs syndrome).  Dispense: 270 tablet; Refill: 1   Severe Anxiety and Depression Noted irritability, difficulty relaxing, restlessness, and worry. Currently on Bupropion, which is more effective for depression than anxiety. -Transition from Bupropion to Effexor, which treats both depression and anxiety. -Schedule a video visit in 4 weeks to assess response to medication change.

## 2023-11-29 LAB — BASIC METABOLIC PANEL WITH GFR
BUN: 25 mg/dL (ref 7–25)
CO2: 29 mmol/L (ref 20–32)
Calcium: 10.3 mg/dL (ref 8.6–10.4)
Chloride: 106 mmol/L (ref 98–110)
Creat: 0.94 mg/dL (ref 0.50–1.05)
Glucose, Bld: 82 mg/dL (ref 65–139)
Potassium: 4.5 mmol/L (ref 3.5–5.3)
Sodium: 142 mmol/L (ref 135–146)
eGFR: 66 mL/min/{1.73_m2} (ref >=60)

## 2023-11-29 LAB — TSH: TSH: 1.12 m[IU]/L (ref 0.40–4.50)

## 2023-12-03 DIAGNOSIS — L501 Idiopathic urticaria: Secondary | ICD-10-CM | POA: Diagnosis not present

## 2023-12-17 ENCOUNTER — Encounter: Payer: Self-pay | Admitting: Family

## 2023-12-17 ENCOUNTER — Ambulatory Visit (INDEPENDENT_AMBULATORY_CARE_PROVIDER_SITE_OTHER): Payer: Medicare Other | Admitting: Family

## 2023-12-17 VITALS — BP 118/70 | HR 62 | Temp 97.6°F | Resp 20 | Ht 65.0 in | Wt 149.4 lb

## 2023-12-17 DIAGNOSIS — E2839 Other primary ovarian failure: Secondary | ICD-10-CM

## 2023-12-17 DIAGNOSIS — Z0000 Encounter for general adult medical examination without abnormal findings: Secondary | ICD-10-CM

## 2023-12-17 NOTE — Patient Instructions (Signed)
[name] , Thank you for taking time to come for your Medicare Wellness Visit. I appreciate your ongoing commitment to your health goals. Please review the following plan we discussed and let me know if I can assist you in the future.   Screening recommendations/referrals: Colonoscopy : up to date  Mammogram : up to date  Bone Density : due ordered today  Recommended yearly ophthalmology/optometry visit for glaucoma screening and checkup Recommended yearly dental visit for hygiene and checkup  Vaccinations: Influenza vaccine- due annually in September/October Pneumococcal vaccine : up to date  Tdap vaccine : up to date  Shingles vaccine: Due     Advanced directives: yes   Conditions/risks identified:  advanced age (>36men, >80 women);family history of premature cardiovascular disease;hypertension;dyslipidemia;sedentary lifestyle  Next appointment: 1 year    Preventive Care 49 Years and Older, Female Preventive care refers to lifestyle choices and visits with your health care provider that can promote health and wellness. What does preventive care include? A yearly physical exam. This is also called an annual well check. Dental exams once or twice a year. Routine eye exams. Ask your health care provider how often you should have your eyes checked. Personal lifestyle choices, including: Daily care of your teeth and gums. Regular physical activity. Eating a healthy diet. Avoiding tobacco and drug use. Limiting alcohol use. Practicing safe sex. Taking low-dose aspirin every day. Taking vitamin and mineral supplements as recommended by your health care provider. What happens during an annual well check? The services and screenings done by your health care provider during your annual well check will depend on your age, overall health, lifestyle risk factors, and family history of disease. Counseling  Your health care provider may ask you questions about your: Alcohol use. Tobacco  use. Drug use. Emotional well-being. Home and relationship well-being. Sexual activity. Eating habits. History of falls. Memory and ability to understand (cognition). Work and work Astronomer. Reproductive health. Screening  You may have the following tests or measurements: Height, weight, and BMI. Blood pressure. Lipid and cholesterol levels. These may be checked every 5 years, or more frequently if you are over 55 years old. Skin check. Lung cancer screening. You may have this screening every year starting at age 63 if you have a 30-pack-year history of smoking and currently smoke or have quit within the past 15 years. Fecal occult blood test (FOBT) of the stool. You may have this test every year starting at age 4. Flexible sigmoidoscopy or colonoscopy. You may have a sigmoidoscopy every 5 years or a colonoscopy every 10 years starting at age 6. Hepatitis C blood test. Hepatitis B blood test. Sexually transmitted disease (STD) testing. Diabetes screening. This is done by checking your blood sugar (glucose) after you have not eaten for a while (fasting). You may have this done every 1-3 years. Bone density scan. This is done to screen for osteoporosis. You may have this done starting at age 55. Mammogram. This may be done every 1-2 years. Talk to your health care provider about how often you should have regular mammograms. Talk with your health care provider about your test results, treatment options, and if necessary, the need for more tests. Vaccines  Your health care provider may recommend certain vaccines, such as: Influenza vaccine. This is recommended every year. Tetanus, diphtheria, and acellular pertussis (Tdap, Td) vaccine. You may need a Td booster every 10 years. Zoster vaccine. You may need this after age 33. Pneumococcal 13-valent conjugate (PCV13) vaccine. One dose is  recommended after age 4. Pneumococcal polysaccharide (PPSV23) vaccine. One dose is recommended  after age 61. Talk to your health care provider about which screenings and vaccines you need and how often you need them. This information is not intended to replace advice given to you by your health care provider. Make sure you discuss any questions you have with your health care provider. Document Released: 11/12/2015 Document Revised: 07/05/2016 Document Reviewed: 08/17/2015 Elsevier Interactive Patient Education  2017 [employer].  Fall Prevention in the Home Falls can cause injuries. They can happen to people of all ages. There are many things you can do to make your home safe and to help prevent falls. What can I do on the outside of my home? Regularly fix the edges of walkways and driveways and fix any cracks. Remove anything that might make you trip as you walk through a door, such as a raised step or threshold. Trim any bushes or trees on the path to your home. Use bright outdoor lighting. Clear any walking paths of anything that might make someone trip, such as rocks or tools. Regularly check to see if handrails are loose or broken. Make sure that both sides of any steps have handrails. Any raised decks and porches should have guardrails on the edges. Have any leaves, snow, or ice cleared regularly. Use sand or salt on walking paths during winter. Clean up any spills in your garage right away. This includes oil or grease spills. What can I do in the bathroom? Use night lights. Install grab bars by the toilet and in the tub and shower. Do not use towel bars as grab bars. Use non-skid mats or decals in the tub or shower. If you need to sit down in the shower, use a plastic, non-slip stool. Keep the floor dry. Clean up any water that spills on the floor as soon as it happens. Remove soap buildup in the tub or shower regularly. Attach bath mats securely with double-sided non-slip rug tape. Do not have throw rugs and other things on the floor that can make you trip. What can I do  in the bedroom? Use night lights. Make sure that you have a light by your bed that is easy to reach. Do not use any sheets or blankets that are too big for your bed. They should not hang down onto the floor. Have a firm chair that has side arms. You can use this for support while you get dressed. Do not have throw rugs and other things on the floor that can make you trip. What can I do in the kitchen? Clean up any spills right away. Avoid walking on wet floors. Keep items that you use a lot in easy-to-reach places. If you need to reach something above you, use a strong step stool that has a grab bar. Keep electrical cords out of the way. Do not use floor polish or wax that makes floors slippery. If you must use wax, use non-skid floor wax. Do not have throw rugs and other things on the floor that can make you trip. What can I do with my stairs? Do not leave any items on the stairs. Make sure that there are handrails on both sides of the stairs and use them. Fix handrails that are broken or loose. Make sure that handrails are as long as the stairways. Check any carpeting to make sure that it is firmly attached to the stairs. Fix any carpet that is loose or worn. Avoid having  throw rugs at the top or bottom of the stairs. If you do have throw rugs, attach them to the floor with carpet tape. Make sure that you have a light switch at the top of the stairs and the bottom of the stairs. If you do not have them, ask someone to add them for you. What else can I do to help prevent falls? Wear shoes that: Do not have high heels. Have rubber bottoms. Are comfortable and fit you well. Are closed at the toe. Do not wear sandals. If you use a stepladder: Make sure that it is fully opened. Do not climb a closed stepladder. Make sure that both sides of the stepladder are locked into place. Ask someone to hold it for you, if possible. Clearly mark and make sure that you can see: Any grab bars or  handrails. First and last steps. Where the edge of each step is. Use tools that help you move around (mobility aids) if they are needed. These include: Canes. Walkers. Scooters. Crutches. Turn on the lights when you go into a dark area. Replace any light bulbs as soon as they burn out. Set up your furniture so you have a clear path. Avoid moving your furniture around. If any of your floors are uneven, fix them. If there are any pets around you, be aware of where they are. Review your medicines with your doctor. Some medicines can make you feel dizzy. This can increase your chance of falling. Ask your doctor what other things that you can do to help prevent falls. This information is not intended to replace advice given to you by your health care provider. Make sure you discuss any questions you have with your health care provider. Document Released: 08/12/2009 Document Revised: 03/23/2016 Document Reviewed: 11/20/2014 Elsevier Interactive Patient Education  2017 [employer].

## 2023-12-17 NOTE — Progress Notes (Signed)
Subjective:   [name] is a 68 y.o. female who presents for Medicare Annual (Subsequent) preventive examination.  Visit Complete: In person  Patient Medicare AWV questionnaire was completed by the patient on 12/17/2023; I have confirmed that all information answered by patient is correct and no changes since this date.  Cardiac Risk Factors include: advanced age (>17men, >31 women);family history of premature cardiovascular disease;hypertension;dyslipidemia;sedentary lifestyle     Objective:    Today's Vitals   12/17/23 1110  BP: 118/70  Pulse: 62  Resp: 20  Temp: 97.6 F (36.4 C)  SpO2: 96%  Weight: 149 lb 6.4 oz (67.8 kg)  Height: 5\' 5"  (1.651 m)   Body mass index is 24.86 kg/m.     12/17/2023   10:56 AM 11/28/2023   10:56 AM 10/03/2023    2:27 PM 07/26/2023    3:54 PM 06/26/2023    2:11 PM 06/13/2023    2:23 PM 05/30/2023    8:44 AM  Advanced Directives  Does Patient Have a Medical Advance Directive? Yes Yes Yes Yes Yes Yes Yes  Type of Estate agent of [place];Out of facility DNR (pink MOST or yellow form);Living will Healthcare Power of [place];Out of facility DNR (pink MOST or yellow form);Living will Healthcare Power of [place];Living will;Out of facility DNR (pink MOST or yellow form) Living will;Healthcare Power of [facility] Living will;Healthcare Power of [place];Out of facility DNR (pink MOST or yellow form) Healthcare Power of [place];Living will Living will  Does patient want to make changes to medical advance directive? No - Patient declined No - Patient declined No - Patient declined No - Patient declined No - Patient declined No - Patient declined No - Patient declined  Copy of Healthcare Power of Attorney in Chart? Yes - validated most recent copy scanned in chart (See row information) Yes - validated most recent copy scanned in chart (See row information) Yes - validated most recent copy scanned in chart (See row information) Yes -  validated most recent copy scanned in chart (See row information) No - copy requested No - copy requested   Would patient like information on creating a medical advance directive?      No - Patient declined No - Patient declined    Current Medications (verified) Outpatient Encounter Medications as of 12/17/2023  Medication Sig   acetaminophen (TYLENOL) 500 MG tablet Take 2 tablets (1,000 mg total) by mouth every 6 (six) hours as needed.   albuterol (PROVENTIL HFA;VENTOLIN HFA) 108 (90 Base) MCG/ACT inhaler Inhale 2 puffs into the lungs every 4 (four) hours as needed for wheezing or shortness of breath.   ARIPiprazole (ABILIFY) 2 MG tablet TAKE 1 TABLET BY MOUTH DAILY   Ascorbic Acid (VITAMIN C PO) Take 1 tablet by mouth daily.   aspirin EC 81 MG tablet Take 81 mg by mouth at bedtime.   atorvastatin (LIPITOR) 10 MG tablet TAKE 1 TABLET BY MOUTH DAILY   buPROPion (WELLBUTRIN XL) 150 MG 24 hr tablet TAKE 3 TABLETS BY MOUTH DAILY   Cholecalciferol (VITAMIN D-3 PO) Take 1 capsule by mouth daily.   cyanocobalamin (VITAMIN B12) 1000 MCG/ML injection Inject 1 mL (1,000 mcg total) into the muscle every 30 (thirty) days.   docusate sodium (COLACE) 100 MG capsule Take 1 capsule (100 mg total) by mouth 2 (two) times daily. While taking narcotic pain medicine.   EPINEPHrine 0.3 mg/0.3 mL IJ SOAJ injection Inject 0.3 mg into the muscle as needed for anaphylaxis.  ipratropium (ATROVENT) 0.06 % nasal spray Place 2 sprays into both nostrils 2 (two) times daily as needed for rhinitis.   ketotifen (ZADITOR) 0.025 % ophthalmic solution Place 1 drop into both eyes daily.   levocetirizine (XYZAL) 5 MG tablet SMARTSIG:1 Tablet(s) By Mouth Every Evening   levothyroxine (SYNTHROID) 150 MCG tablet Take 150 mcg by mouth daily.   metoprolol succinate (TOPROL-XL) 25 MG 24 hr tablet Take 1 tablet (25 mg total) by mouth daily.   naproxen sodium (ALEVE) 220 MG tablet Take 220 mg by mouth daily as needed (general pain,  headache).   nitroGLYCERIN (NITROSTAT) 0.4 MG SL tablet Place 1 tablet (0.4 mg total) under the tongue every 5 (five) minutes as needed for chest pain.   omalizumab (XOLAIR) 150 MG injection 300 mg every 30 (thirty) days. Every 29th or 30th   pantoprazole (PROTONIX) 40 MG tablet Take 40 mg by mouth 2 (two) times daily.   polyethylene glycol (MIRALAX / GLYCOLAX) 17 g packet Take 17 g by mouth daily as needed (constipation).   pramipexole (MIRAPEX) 0.125 MG tablet Take 3 tablets (0.375 mg total) by mouth at bedtime as needed (restless legs syndrome).   Probiotic Product (PROBIOTIC PO) Take 1 capsule by mouth daily.   TRELEGY ELLIPTA 200-62.5-25 MCG/ACT AEPB Inhale 1 puff into the lungs at bedtime.   venlafaxine XR (EFFEXOR XR) 75 MG 24 hr capsule Take 1 capsule (75 mg total) by mouth daily with breakfast.   No facility-administered encounter medications on file as of 12/17/2023.    Allergies (verified) Asa [aspirin], Bee venom, Peanut-containing drug products, Ambien [zolpidem tartrate], Tape, Cipro [ciprofloxacin hcl], Latex, Minocycline, and Shellfish allergy   History: Past Medical History:  Diagnosis Date   Asthma    Back pain    Chronic fatigue syndrome    Dr. Hyacinth Meeker   Complication of anesthesia    Cough    /SOB, PFT's nl, improved with Bronchodilation 8/11   Dysrhythmia    SVT   Endometrial cancer (HCC)    Esophageal spasm    GERD Dr. Hyacinth Meeker, Dr. Laural Benes; egd 2006 nl; UGI 3/13 [facility] HH, moderate GERD, nl motility; seen at [facility] ([place] egd? neg, sone improvement on sucralfate susp   Fibromyalgia    Foot pain    Dr. Netta Corrigan   Hashimoto's thyroiditis    High triglycerides    Hypothyroidism    Dr. Wynne Dust 4/14   Leukocytoclastic vasculitis (HCC)    Dr. Danella Deis   Low HDL (under 40)    Mitral valve prolapse    Paroxysmal atrial tachycardia (HCC)    Dr. Anne Fu   Patent foramen ovale    Pernicious anemia    Dr. Hyacinth Meeker   PONV (postoperative nausea and vomiting)    along  time ago had nausea ans vomiting after surgery-25 years ago   Reflux    ? delayed gastric emptying--GES nl 01/2012 (6% at 2hrs)   RLS (restless legs syndrome)    low ferritin Dr. Hyacinth Meeker   Rosacea    Sleep apnea    Uterine carcinoma [facility])    Dr. Clifton James   Past Surgical History:  Procedure Laterality Date   ABDOMINAL EXPOSURE N/A 12/07/2021   Procedure: ABDOMINAL EXPOSURE;  Surgeon: Cephus Shelling, MD;  Location: [facility] OR;  Service: Vascular;  Laterality: N/A;   ABDOMINAL HYSTERECTOMY     bladder tack     x 2   BUNIONECTOMY     CALCANEAL OSTEOTOMY Right 01/04/2023   Procedure: CALCANEAL OSTEOTOMY;  Surgeon: Toni Arthurs,  MD;  Location: [facility];  Service: Orthopedics;  Laterality: Right;   CHOLECYSTECTOMY  2009   COLONOSCOPY     scr, 12/2008 ([facility]) nl   COLOSTOMY Left 06/05/2018   Procedure: COLOSTOMY;  Surgeon: Axel Filler, MD;  Location: [facility] OR;  Service: General;  Laterality: Left;   COLOSTOMY REVERSAL N/A 10/16/2018   Procedure: HARTMANN'S COLOSTOMY REVERSAL, RIGID PROCTOSCOPY;  Surgeon: Axel Filler, MD;  Location: [facility] ORS;  Service: General;  Laterality: N/A;   GASTROCNEMIUS RECESSION Right 01/04/2023   Procedure: GASTROCNEMIUS recession;  Surgeon: Toni Arthurs, MD;  Location: [facility];  Service: Orthopedics;  Laterality: Right;   HAMMER TOE SURGERY     HYSTERECTOMY ABDOMINAL WITH SALPINGO-OOPHORECTOMY  2008   INCISIONAL HERNIA REPAIR  04/09/2019   x2   INCISIONAL HERNIA REPAIR N/A 04/09/2019   Procedure: INCISIONAL HERNIA REPAIR WITH MESH;  Surgeon: Axel Filler, MD;  Location: [facility] OR;  Service: General;  Laterality: N/A;   LAPAROSCOPY N/A 10/16/2018   Procedure: LAPAROSCOPY DIAGNOSTIC WITH LYSIS OF ADHESIONS;  Surgeon: Axel Filler, MD;  Location: [facility] ORS;  Service: General;  Laterality: N/A;   LUNG BIOPSY     L Lower lung pulm nodules, largest 4.60mm, low risk, repeat CT in 1 yr now following with pulm at [facility]; 9/13 Stable tiny  lung nodules, no f/u needed   LYMPH NODE DISSECTION  2008   LYSIS OF ADHESION N/A 04/09/2019   Procedure: OPEN LYSIS OF ADHESION;  Surgeon: Axel Filler, MD;  Location: [facility] OR;  Service: General;  Laterality: N/A;   METATARSAL OSTEOTOMY     NECK SURGERY     x 2   OBLIQUE LUMBAR INTERBODY FUSION 1 LEVEL WITH PERCUTANEOUS SCREWS N/A 12/07/2021   Procedure: OBLIQUE LUMBAR INTERBODY FUSION 1 LEVEL WITH PERCUTANEOUS SCREWS (OLIF L4-5 WITH POSTERIOR SPINAL FUSION AND PEDICLE SCREWS);  Surgeon: Venita Lick, MD;  Location[facility] OR;  Service: Orthopedics;  Laterality: N/A;  4 HRS DR. CLARK TO DO APPROACH LEFT TAP BLOCK WITH EXPAREL 3 C-BED   PARTIAL COLECTOMY N/A 06/05/2018   Procedure: EXPLORATORY LAPAROTOMY AND PARTIAL COLECTOMY;  Surgeon: Axel Filler, MD;  Location: [facility] OR;  Service: General;  Laterality: N/A;   POSTERIOR TIBIAL TENDON REPAIR Right 01/04/2023   Procedure: POSTERIOR TIBIAL TENDON TENOLYSIS AND  flexor digitorum longus TRANSFER TO NAVICULAR;  Surgeon: Toni Arthurs, MD;  Location: [facility];  Service: Orthopedics;  Laterality: Right;   TONSILLECTOMY     US ECHOCARDIOGRAPHY     with Nuclear test, Dr. Anne Fu, Low risk   VESICOVAGINAL FISTULA CLOSURE W/ TAH     Family History  Problem Relation Age of Onset   Hashimoto's thyroiditis Mother    High blood pressure Mother    Stroke Mother    Other Mother        Borderline DM   Alzheimer's disease Mother    Ulcerative colitis Father    Meniere's disease Father    Hearing loss Father    Stroke Father    Stroke Sister    Social History   Socioeconomic History   Marital status: Married    Spouse name: Not on file   Number of children: Not on file   Years of education: Not on file   Highest education level: Not on file  Occupational History   Not on file  Tobacco Use   Smoking status: Never   Smokeless tobacco: Never  Vaping Use   Vaping status: Never Used  Substance and Sexual Activity  Alcohol  use: Yes    Comment: maybe a glass of wine/month   Drug use: Never   Sexual activity: Not on file  Other Topics Concern   Not on file  Social History Narrative   Diet: High calorie/ high fat      Caffeine: Yes      Married, if yes what year: Married, 1986      Do you live in a house, apartment, assisted living, condo, trailer, ect: House      Is it one or more stories: One story      How many persons live in your home? 2      Pets: 2      Highest level or education completed: completed college, some graduate school      Current/Past profession: Retail-Division management      Exercise: Rarely                 Type and how often: Not often- experience PEM         Living Will:    DNR:   POA/HPOA:      Functional Status:   Do you have difficulty bathing or dressing yourself? No   Do you have difficulty preparing food or eating?No   Do you have difficulty managing your medications?No   Do you have difficulty managing your finances?No   Do you have difficulty affording your medications?No   Social Drivers of Corporate investment banker Strain: Not on file  Food Insecurity: No Food Insecurity (06/18/2023)   Hunger Vital Sign    Worried About Running Out of Food in the Last Year: Never true    Ran Out of Food in the Last Year: Never true  Transportation Needs: No Transportation Needs (06/18/2023)   PRAPARE - Administrator, Civil Service (Medical): No    Lack of Transportation (Non-Medical): No  Physical Activity: Not on file  Stress: Not on file  Social Connections: Not on file    Tobacco Counseling Counseling given: Not Answered   Clinical Intake:  Pre-visit preparation completed: No  Pain : No/denies pain     BMI - recorded: 24.86 Nutritional Status: BMI of 19-24  Normal Nutritional Risks: None Diabetes: No  How often do you need to have someone help you when you read instructions, pamphlets, or other written materials from your doctor or  pharmacy?: 1 - Never What is the last grade level you completed in school?: Bachelor's degree  Interpreter Needed?: No      Activities of Daily Living    12/17/2023   11:25 AM 06/13/2023    2:23 PM  In your present state of health, do you have any difficulty performing the following activities:  Hearing? 0 0  Vision? 0 0  Difficulty concentrating or making decisions? 0 0  Walking or climbing stairs? 0 0  Dressing or bathing? 0 1  Doing errands, shopping? 0 1  Preparing Food and eating ? N   Using the Toilet? N   In the past six months, have you accidently leaked urine? N   Do you have problems with loss of bowel control? N   Managing your Medications? N   Managing your Finances? N   Housekeeping or managing your Housekeeping? N     Patient Care Team: Venita Sheffield, MD as PCP - General (Internal Medicine) Jake Bathe, MD as PCP - Cardiology (Cardiology) Suzi Roots as Physician Assistant (Dermatology) Eileen Stanford, MD as Referring Physician (  Allergy and Immunology) Bernette Redbird, MD as Consulting Physician (Gastroenterology) Axel Filler, MD as Consulting Physician (General Surgery) Venita Lick, MD as Consulting Physician (Orthopedic Surgery) Inis Sizer, MD (Internal Medicine) Regal, Kirstie Peri, DPM as Consulting Physician (Podiatry) Dorisann Frames, MD as Referring Physician (Endocrinology) Essie Hart, MD (Inactive) as Referring Physician (Obstetrics and Gynecology) Golda Acre, OD (Optometry) Barron Alvine, MD (Inactive) (Urology) Krystal Clark, MD as Referring Physician (Rheumatology) Mammography, [facility] (Diagnostic Radiology)  Indicate any recent Medical Services you may have received from other than [facility] providers in the past year (date may be approximate).     Assessment:   This is a routine wellness examination for [name].  Hearing/Vision screen Vision Screening   Right eye Left eye Both eyes  Without correction      With correction 20/50 20/50 20/50   Hearing Screening - Comments:: No hearing issues.   Goals Addressed             This Visit's Progress    exercise       Stay active        Depression Screen    12/17/2023   10:55 AM 11/28/2023   11:11 AM 12/12/2022   11:18 AM 11/29/2022   10:45 AM 07/05/2021   10:44 AM  PHQ 2/9 Scores  PHQ - 2 Score 0 5 0 0 0  PHQ- 9 Score  12 0 0   Exception Documentation   Other- indicate reason in comment box    Not completed   annual wellness visit      Fall Risk    12/17/2023   10:55 AM 11/28/2023   10:56 AM 10/03/2023    2:26 PM 07/27/2023    3:12 PM 06/26/2023    2:13 PM  Fall Risk   Falls in the past year? 0 0 1 1 1   Number falls in past yr: 0 0 0 0   Injury with Fall? 0 0 1 0 0  Risk for fall due to :  No Fall Risks History of fall(s);Impaired balance/gait;Impaired mobility No Fall Risks   Follow up  Falls evaluation completed;Education provided;Falls prevention discussed Falls evaluation completed;Education provided;Falls prevention discussed Falls evaluation completed Education provided;Falls evaluation completed    MEDICARE RISK AT HOME: Medicare Risk at Home Any stairs in or around the home?: Yes If so, are there any without handrails?: No Home free of loose throw rugs in walkways, pet beds, electrical cords, etc?: No Adequate lighting in your home to reduce risk of falls?: No Life alert?: No Use of a cane, walker or w/c?: No Grab bars in the bathroom?: Yes Shower chair or bench in shower?: Yes Elevated toilet seat or a handicapped toilet?: No  TIMED UP AND GO:  Was the test performed?  Yes  Length of time to ambulate 10 feet: 5 sec Gait steady and fast without use of assistive device    Cognitive Function:    12/17/2023   11:04 AM 07/22/2019    3:50 PM  MMSE - Mini Mental State Exam  Orientation to time 5 5  Orientation to Place 5 5  Registration 3 3  Attention/ Calculation 5 5  Recall 3 2  Language- name 2 objects 2 2   Language- repeat 1 1  Language- follow 3 step command 3 3  Language- read & follow direction 1 1  Write a sentence 1 1  Copy design 1 1  Total score 30 29        12/12/2022   11:20 AM  6CIT Screen  What Year? 0 points  What month? 0 points  What time? 0 points  Count back from 20 0 points  Months in reverse 0 points  Repeat phrase 0 points  Total Score 0 points    Immunizations Immunization History  Administered Date(s) Administered   Fluad Quad(high Dose 65+) 08/02/2021, 07/28/2022, 06/26/2023   Influenza, High Dose Seasonal PF 09/24/2017, 11/24/2019, 08/16/2020, 08/22/2021   Influenza-Unspecified 04/29/2013, 08/01/2016, 07/29/2017   PFIZER Comirnaty(Gray Top)Covid-19 Tri-Sucrose Vaccine 02/25/2021   PFIZER(Purple Top)SARS-COV-2 Vaccination 12/05/2019, 12/30/2019, 08/16/2020, 08/22/2021   PNEUMOCOCCAL CONJUGATE-20 11/28/2023   Pfizer Covid-19 Vaccine Bivalent Booster 19yrs & up 07/28/2022   Pneumococcal Polysaccharide-23 09/24/2017, 04/11/2019, 11/24/2019, 08/16/2020, 08/22/2021   Pneumococcal-Unspecified 04/30/2011   Td 02/04/2018   Td (Adult), 2 Lf Tetanus Toxid, Preservative Free 02/04/2018   Tdap 09/28/2023   Zoster Recombinant(Shingrix) 03/02/2020   Zoster, Live 03/01/2020, 06/01/2020    TDAP status: Up to date  Flu Vaccine status: Up to date  Pneumococcal vaccine status: Up to date  Covid-19 vaccine status: Information provided on how to obtain vaccines.   Qualifies for Shingles Vaccine? Yes   Zostavax completed No   Shingrix Completed?: No.    Education has been provided regarding the importance of this vaccine. Patient has been advised to call insurance company to determine out of pocket expense if they have not yet received this vaccine. Advised may also receive vaccine at local pharmacy or Health Dept. Verbalized acceptance and understanding.  Screening Tests Health Maintenance  Topic Date Due   DEXA SCAN  Never done   Zoster Vaccines- Shingrix (2  of 2) 07/27/2020   COVID-19 Vaccine (7 - 2024-25 season) 07/01/2023   Medicare Annual Wellness (AWV)  12/16/2024   MAMMOGRAM  05/15/2025   Colonoscopy  08/30/2028   DTaP/Tdap/Td (4 - Td or Tdap) 09/27/2033   Pneumonia Vaccine 1+ Years old  Completed   INFLUENZA VACCINE  Completed   Hepatitis C Screening  Completed   HPV VACCINES  Aged Out    Health Maintenance  Health Maintenance Due  Topic Date Due   DEXA SCAN  Never done   Zoster Vaccines- Shingrix (2 of 2) 07/27/2020   COVID-19 Vaccine (7 - 2024-25 season) 07/01/2023    Colorectal cancer screening: Type of screening: Colonoscopy. Completed 08/30/2018. Repeat every 10 years  Mammogram status: Completed 05/16/2023 . Repeat every year  Bone Density status: Ordered today. Pt provided with contact info and advised to call to schedule appt.  Lung Cancer Screening: (Low Dose CT Chest recommended if Age 70-80 years, 20 pack-year currently smoking OR have quit w/in 15years.) does not qualify.   Lung Cancer Screening Referral: No   Additional Screening:  Hepatitis C Screening: does qualify; Completed yes   Vision Screening: Recommended annual ophthalmology exams for early detection of glaucoma and other disorders of the eye. Is the patient up to date with their annual eye exam?  Yes  Who is the provider or what is the name of the office in which the patient attends annual eye exams? Dr.Skeleton  If pt is not established with a provider, would they like to be referred to a provider to establish care? No .   Dental Screening: Recommended annual dental exams for proper oral hygiene  Diabetic Foot Exam: Diabetic Foot Exam: Completed N/A   Community Resource Referral / Chronic Care Management: CRR required this visit?  No   CCM required this visit?  No     Plan:     I have  personally reviewed and noted the following in the patient's chart:   Medical and social history Use of alcohol, tobacco or illicit drugs  Current  medications and supplements including opioid prescriptions. Patient is not currently taking opioid prescriptions. Functional ability and status Nutritional status Physical activity Advanced directives List of other physicians Hospitalizations, surgeries, and ER visits in previous 12 months Vitals Screenings to include cognitive, depression, and falls Referrals and appointments  In addition, I have reviewed and discussed with patient certain preventive protocols, quality metrics, and best practice recommendations. A written personalized care plan for preventive services as well as general preventive health recommendations were provided to patient.     Caesar Bookman, NP   12/17/2023   After Visit Summary: (In Person-Printed) AVS printed and given to the patient  Nurse Notes: Advised to get COVID-19 and Shingles at the pharmacy

## 2023-12-26 ENCOUNTER — Encounter: Payer: Self-pay | Admitting: Sports Medicine

## 2023-12-26 ENCOUNTER — Telehealth: Payer: Medicare Other | Admitting: Sports Medicine

## 2023-12-26 DIAGNOSIS — F32A Depression, unspecified: Secondary | ICD-10-CM | POA: Diagnosis not present

## 2023-12-26 DIAGNOSIS — F320 Major depressive disorder, single episode, mild: Secondary | ICD-10-CM

## 2023-12-26 DIAGNOSIS — F411 Generalized anxiety disorder: Secondary | ICD-10-CM | POA: Diagnosis not present

## 2023-12-26 DIAGNOSIS — E039 Hypothyroidism, unspecified: Secondary | ICD-10-CM | POA: Diagnosis not present

## 2023-12-26 MED ORDER — VENLAFAXINE HCL ER 150 MG PO CP24: 150.0000 mg | ORAL_CAPSULE | Freq: Every day | ORAL | 0 refills | Status: DC

## 2023-12-26 NOTE — Progress Notes (Signed)
 Careteam: Patient Care Team: Venita Sheffield, MD as PCP - General (Internal Medicine) Jake Bathe, MD as PCP - Cardiology (Cardiology) Glyn Ade, PA-C as Physician Assistant (Dermatology) Eileen Stanford, MD as Referring Physician (Allergy and Immunology) Bernette Redbird, MD as Consulting Physician (Gastroenterology) Axel Filler, MD as Consulting Physician (General Surgery) Venita Lick, MD as Consulting Physician (Orthopedic Surgery) Inis Sizer, MD (Internal Medicine) Charlsie Merles Kirstie Peri, DPM as Consulting Physician (Podiatry) Dorisann Frames, MD as Referring Physician (Endocrinology) Essie Hart, MD (Inactive) as Referring Physician (Obstetrics and Gynecology) Golda Acre, OD (Optometry) Barron Alvine, MD (Inactive) (Urology) Krystal Clark, MD as Referring Physician (Rheumatology) Mammography, [facility] (Diagnostic Radiology)  PLACE OF SERVICE:  [facility]  Advanced Directive information Does Patient Have a Medical Advance Directive?: Yes, Type of Advance Directive: Healthcare Power of [place];Living will;Out of facility DNR (pink MOST or yellow form), Does patient want to make changes to medical advance directive?: No - Patient declined  Allergies  Allergen Reactions   Asa [Aspirin] Anaphylaxis    325mg  or higher Pt ok with 81mg  daily dose   Bee Venom Shortness Of Breath, Itching, Swelling and Hives   Peanut-Containing Drug Products Anaphylaxis and Other (See Comments)   Ambien [Zolpidem Tartrate] Other (See Comments)    Hallucinations   Tape Itching, Swelling and Rash   Cipro [Ciprofloxacin Hcl] Nausea And Vomiting   Latex Rash   Minocycline Rash   Shellfish Allergy Rash    Chief Complaint  Patient presents with   Follow-up    4 week follow up on medication Effexor                                           Video visit                                            Start time 3.20                                              End time 3.36  I  connected with patient on   by mychart and verified that I am speaking with the correct person using two identifiers.   Location: Patient:  [name] Provider:  Dr Jacquenette Shone   I discussed the limitations, risks, security and privacy concerns of performing an evaluation and management service by telephone and the availability of in person appointments. I also discussed with the patient that there may be a patient responsible charge related to this service. The patient expressed understanding and agreed to proceed.     I discussed the assessment and treatment plan with the patient. The patient was provided an opportunity to ask questions and all were answered. The patient agreed with the plan and demonstrated an understanding of the instructions.   The patient was advised to call back or seek an in-person evaluation if the symptoms worsen or if the condition fails to improve as anticipated.   Discussed the use of AI scribe software for clinical note transcription with the patient, who gave verbal consent to proceed.  History of Present Illness   [name] is a 68 year old female with  depression and anxiety who presents for medication management.  She experiences ongoing issues with anxiety, feeling nervous or on edge every day, and finds it difficult to control her worrying. These symptoms occur daily, and she worries about various things. She also finds it hard to relax and sit still, feeling irritable and easily annoyed, with thoughts that something awful might happen.  Regarding her depression, she lacks interest or pleasure in activities, although she tries to do embroidery and read books. She feels down or depressed more than three times a week. Her sleep is disturbed; she wakes up during the night and has difficulty returning to sleep. She feels tired and lacks energy most of the time. Her appetite is affected, and she struggles with focus and concentration when reading or  embroidering.  No problems with breathing, chest pain, dizziness, lightheadedness, stomach pain, nausea, or pain with urination.         Review of Systems:  Review of Systems  Constitutional:  Negative for chills and fever.  HENT:  Negative for congestion and sore throat.   Eyes:  Negative for double vision.  Respiratory:  Negative for cough, sputum production and shortness of breath.   Cardiovascular:  Negative for chest pain, palpitations and leg swelling.  Gastrointestinal:  Negative for abdominal pain, heartburn and nausea.  Genitourinary:  Negative for dysuria, frequency and hematuria.  Musculoskeletal:  Negative for falls and myalgias.  Neurological:  Negative for dizziness, sensory change and focal weakness.  Psychiatric/Behavioral:  Positive for depression. Negative for suicidal ideas. The patient is nervous/anxious.    Negative unless indicated in HPI.   Past Medical History:  Diagnosis Date   Asthma    Back pain    Chronic fatigue syndrome    Dr. Hyacinth Meeker   Complication of anesthesia    Cough    /SOB, PFT's nl, improved with Bronchodilation 8/11   Dysrhythmia    SVT   Endometrial cancer (HCC)    Esophageal spasm    GERD Dr. Hyacinth Meeker, Dr. Laural Benes; egd 2006 nl; UGI 3/13 Small HH, moderate GERD, nl motility; seen at [facility] ([place] egd? neg, sone improvement on sucralfate susp   Fibromyalgia    Foot pain    Dr. Netta Corrigan   Hashimoto's thyroiditis    High triglycerides    Hypothyroidism    Dr. Wynne Dust 4/14   Leukocytoclastic vasculitis (HCC)    Dr. Danella Deis   Low HDL (under 40)    Mitral valve prolapse    Paroxysmal atrial tachycardia (HCC)    Dr. Anne Fu   Patent foramen ovale    Pernicious anemia    Dr. Hyacinth Meeker   PONV (postoperative nausea and vomiting)    along time ago had nausea ans vomiting after surgery-25 years ago   Reflux    ? delayed gastric emptying--GES nl 01/2012 (6% at 2hrs)   RLS (restless legs syndrome)    low ferritin Dr. Hyacinth Meeker   Rosacea     Sleep apnea    Uterine carcinoma (HCC)    Dr. Clifton James   Past Surgical History:  Procedure Laterality Date   ABDOMINAL EXPOSURE N/A 12/07/2021   Procedure: ABDOMINAL EXPOSURE;  Surgeon: Cephus Shelling, MD;  Location: [facility] OR;  Service: Vascular;  Laterality: N/A;   ABDOMINAL HYSTERECTOMY     bladder tack     x 2   BUNIONECTOMY     CALCANEAL OSTEOTOMY Right 01/04/2023   Procedure: CALCANEAL OSTEOTOMY;  Surgeon: Toni Arthurs, MD;  Location: [facility];  Service: Orthopedics;  Laterality: Right;   CHOLECYSTECTOMY  2009   COLONOSCOPY     scr, 12/2008 ([facility]) nl   COLOSTOMY Left 06/05/2018   Procedure: COLOSTOMY;  Surgeon: Axel Filler, MD;  Location: [facility] OR;  Service: General;  Laterality: Left;   COLOSTOMY REVERSAL N/A 10/16/2018   Procedure: HARTMANN'S COLOSTOMY REVERSAL, RIGID PROCTOSCOPY;  Surgeon: Axel Filler, MD;  Location: [facility] ORS;  Service: General;  Laterality: N/A;   GASTROCNEMIUS RECESSION Right 01/04/2023   Procedure: GASTROCNEMIUS recession;  Surgeon: Toni Arthurs, MD;  Location: [facility];  Service: Orthopedics;  Laterality: Right;   HAMMER TOE SURGERY     HYSTERECTOMY ABDOMINAL WITH SALPINGO-OOPHORECTOMY  2008   INCISIONAL HERNIA REPAIR  04/09/2019   x2   INCISIONAL HERNIA REPAIR N/A 04/09/2019   Procedure: INCISIONAL HERNIA REPAIR WITH MESH;  Surgeon: Axel Filler, MD;  Location: [facility] OR;  Service: General;  Laterality: N/A;   LAPAROSCOPY N/A 10/16/2018   Procedure: LAPAROSCOPY DIAGNOSTIC WITH LYSIS OF ADHESIONS;  Surgeon: Axel Filler, MD;  Location: [facility];  Service: General;  Laterality: N/A;   LUNG BIOPSY     L Lower lung pulm nodules, largest 4.40mm, low risk, repeat CT in 1 yr now following with pulm at [facility]; 9/13 Stable tiny lung nodules, no f/u needed   LYMPH NODE DISSECTION  2008   LYSIS OF ADHESION N/A 04/09/2019   Procedure: OPEN LYSIS OF ADHESION;  Surgeon: Axel Filler, MD;  Location: [facility] OR;  Service: General;   Laterality: N/A;   METATARSAL OSTEOTOMY     NECK SURGERY     x 2   OBLIQUE LUMBAR INTERBODY FUSION 1 LEVEL WITH PERCUTANEOUS SCREWS N/A 12/07/2021   Procedure: OBLIQUE LUMBAR INTERBODY FUSION 1 LEVEL WITH PERCUTANEOUS SCREWS (OLIF L4-5 WITH POSTERIOR SPINAL FUSION AND PEDICLE SCREWS);  Surgeon: Venita Lick, MD;  Location: [facility] OR;  Service: Orthopedics;  Laterality: N/A;  4 HRS DR. CLARK TO DO APPROACH LEFT TAP BLOCK WITH EXPAREL 3 C-BED   PARTIAL COLECTOMY N/A 06/05/2018   Procedure: EXPLORATORY LAPAROTOMY AND PARTIAL COLECTOMY;  Surgeon: Axel Filler, MD;  Location: [facility] OR;  Service: General;  Laterality: N/A;   POSTERIOR TIBIAL TENDON REPAIR Right 01/04/2023   Procedure: POSTERIOR TIBIAL TENDON TENOLYSIS AND  flexor digitorum longus TRANSFER TO NAVICULAR;  Surgeon: Toni Arthurs, MD;  Location: [facility];  Service: Orthopedics;  Laterality: Right;   TONSILLECTOMY     US ECHOCARDIOGRAPHY     with Nuclear test, Dr. Anne Fu, Low risk   VESICOVAGINAL FISTULA CLOSURE W/ TAH     Social History:   reports that she has never smoked. She has never used smokeless tobacco. She reports current alcohol use. She reports that she does not use drugs.  Family History  Problem Relation Age of Onset   Hashimoto's thyroiditis Mother    High blood pressure Mother    Stroke Mother    Other Mother        Borderline DM   Alzheimer's disease Mother    Ulcerative colitis Father    Meniere's disease Father    Hearing loss Father    Stroke Father    Stroke Sister     Medications: Patient's Medications  New Prescriptions   VENLAFAXINE XR (EFFEXOR XR) 150 MG 24 HR CAPSULE    Take 1 capsule (150 mg total) by mouth daily with breakfast.  Previous Medications   ACETAMINOPHEN (TYLENOL) 500 MG TABLET    Take 2 tablets (1,000 mg total) by mouth every 6 (six) hours as needed.  ALBUTEROL (PROVENTIL HFA;VENTOLIN HFA) 108 (90 BASE) MCG/ACT INHALER    Inhale 2 puffs into the lungs every 4 (four)  hours as needed for wheezing or shortness of breath.   ARIPIPRAZOLE (ABILIFY) 2 MG TABLET    TAKE 1 TABLET BY MOUTH DAILY   ASCORBIC ACID (VITAMIN C PO)    Take 1 tablet by mouth daily.   ASPIRIN EC 81 MG TABLET    Take 81 mg by mouth at bedtime.   ATORVASTATIN (LIPITOR) 10 MG TABLET    TAKE 1 TABLET BY MOUTH DAILY   BUPROPION (WELLBUTRIN XL) 150 MG 24 HR TABLET    TAKE 3 TABLETS BY MOUTH DAILY   CHOLECALCIFEROL (VITAMIN D-3 PO)    Take 1 capsule by mouth daily.   CYANOCOBALAMIN (VITAMIN B12) 1000 MCG/ML INJECTION    Inject 1 mL (1,000 mcg total) into the muscle every 30 (thirty) days.   DOCUSATE SODIUM (COLACE) 100 MG CAPSULE    Take 1 capsule (100 mg total) by mouth 2 (two) times daily. While taking narcotic pain medicine.   EPINEPHRINE 0.3 MG/0.3 ML IJ SOAJ INJECTION    Inject 0.3 mg into the muscle as needed for anaphylaxis.   IPRATROPIUM (ATROVENT) 0.06 % NASAL SPRAY    Place 2 sprays into both nostrils 2 (two) times daily as needed for rhinitis.   KETOTIFEN (ZADITOR) 0.025 % OPHTHALMIC SOLUTION    Place 1 drop into both eyes daily.   LEVOCETIRIZINE (XYZAL) 5 MG TABLET    SMARTSIG:1 Tablet(s) By Mouth Every Evening   LEVOTHYROXINE (SYNTHROID) 150 MCG TABLET    Take 150 mcg by mouth daily.   METOPROLOL SUCCINATE (TOPROL-XL) 25 MG 24 HR TABLET    Take 1 tablet (25 mg total) by mouth daily.   NAPROXEN SODIUM (ALEVE) 220 MG TABLET    Take 220 mg by mouth daily as needed (general pain, headache).   NITROGLYCERIN (NITROSTAT) 0.4 MG SL TABLET    Place 1 tablet (0.4 mg total) under the tongue every 5 (five) minutes as needed for chest pain.   OMALIZUMAB (XOLAIR) 150 MG INJECTION    300 mg every 30 (thirty) days. Every 29th or 30th   PANTOPRAZOLE (PROTONIX) 40 MG TABLET    Take 40 mg by mouth 2 (two) times daily.   POLYETHYLENE GLYCOL (MIRALAX / GLYCOLAX) 17 G PACKET    Take 17 g by mouth daily as needed (constipation).   PRAMIPEXOLE (MIRAPEX) 0.125 MG TABLET    Take 3 tablets (0.375 mg total) by  mouth at bedtime as needed (restless legs syndrome).   PROBIOTIC PRODUCT (PROBIOTIC PO)    Take 1 capsule by mouth daily.   TRELEGY ELLIPTA 200-62.5-25 MCG/ACT AEPB    Inhale 1 puff into the lungs at bedtime.  Modified Medications   No medications on file  Discontinued Medications   VENLAFAXINE XR (EFFEXOR XR) 75 MG 24 HR CAPSULE    Take 1 capsule (75 mg total) by mouth daily with breakfast.    Physical Exam: There were no vitals filed for this visit. There is no height or weight on file to calculate BMI. BP Readings from Last 3 Encounters:  12/17/23 118/70  11/28/23 124/66  10/03/23 122/74   Wt Readings from Last 3 Encounters:  12/17/23 149 lb 6.4 oz (67.8 kg)  11/28/23 149 lb (67.6 kg)  10/03/23 153 lb 9.6 oz (69.7 kg)      Labs reviewed: Basic Metabolic Panel: Recent Labs    06/13/23 1538 06/14/23 0130 06/15/23 0326 06/16/23  4098 11/28/23 1129  NA 139   < > 138 137 142  K 4.3   < > 3.8 3.6 4.5  CL 105   < > 100 100 106  CO2 26   < > 24 23 29   GLUCOSE 91   < > 55* 69* 82  BUN 12   < > 13 6* 25  CREATININE 0.90   < > 0.92 0.88 0.94  CALCIUM 9.3   < > 9.1 8.6* 10.3  MG 2.2  --  2.0 1.8  --   PHOS 3.8  --   --   --   --   TSH  --   --   --   --  1.12   < > = values in this interval not displayed.   Liver Function Tests: Recent Labs    06/13/23 1005 06/13/23 1538 06/14/23 0130  AST 22 19 17   ALT 16 14 14   ALKPHOS 98 86 83  BILITOT 0.7 0.8 1.1  PROT 7.0 6.7 6.8  ALBUMIN 4.0 3.8 3.7   Recent Labs    06/13/23 1005  LIPASE 33   No results for input(s): "AMMONIA" in the last 8760 hours. CBC: Recent Labs    06/13/23 1005 06/13/23 1013 06/14/23 0130 06/15/23 0818 06/16/23 0353  WBC 13.9*   < > 13.0* 9.7 10.1  NEUTROABS 11.4*  --   --   --   --   HGB 14.5   < > 13.7 12.9 12.0  HCT 44.5   < > 41.2 38.3 34.6*  MCV 94.1   < > 96.9 95.5 91.3  PLT 402*   < > 373 326 327   < > = values in this interval not displayed.   Lipid Panel: Recent Labs     09/06/23 0921  CHOL 134  HDL 38*  LDLCALC 70  TRIG 119  CHOLHDL 3.5   TSH: Recent Labs    11/28/23 1129  TSH 1.12   A1C: Lab Results  Component Value Date   HGBA1C 5.0 06/13/2023    Assessment and Plan    Generalized Anxiety Disorder Daily nervousness, inability to control worrying, difficulty relaxing, restlessness, irritability 75mg  of current medication tolerated well. -Increase medication dose to 150mg  daily. Declined behavioral health therapy   Depression Mild anhedonia, daily feelings of depression, interrupted sleep, low energy, and difficulty concentrating. No suicidal ideation. -Continue current medication and monitor for improvement with increased dose. -Encourage continuation of enjoyable activities such as reading and embroidery. Increase effexor to 150 mg daily   Follow-up in 3-4 weeks via video visit to assess medication efficacy and tolerance.          Return in about 1 week (around 01/02/2024), or 4 weeks   video visit with me in4 weeks  3 pm slot.:   [name]

## 2023-12-26 NOTE — Progress Notes (Signed)
   This service is provided via telemedicine  No vital signs collected/recorded due to the encounter was a telemedicine visit.   Location of patient (ex: home, work):  Home  Patient consents to a telephone visit:  Yes  Location of the provider (ex: office, home):  [employer]  Name of any referring provider:  Venita Sheffield, MD   Names of all persons participating in the telemedicine service and their role in the encounter:  Patient, Meda Klinefelter, RMA, Dr.Prashanthi Veludandi.    Time spent on call:  8 minutes spent on the phone with Medical Assistant.

## 2024-01-01 DIAGNOSIS — L501 Idiopathic urticaria: Secondary | ICD-10-CM | POA: Diagnosis not present

## 2024-01-02 DIAGNOSIS — E039 Hypothyroidism, unspecified: Secondary | ICD-10-CM | POA: Diagnosis not present

## 2024-01-02 DIAGNOSIS — E063 Autoimmune thyroiditis: Secondary | ICD-10-CM | POA: Diagnosis not present

## 2024-01-16 ENCOUNTER — Encounter: Payer: Self-pay | Admitting: Sports Medicine

## 2024-01-16 ENCOUNTER — Telehealth: Admitting: Sports Medicine

## 2024-01-16 DIAGNOSIS — R5383 Other fatigue: Secondary | ICD-10-CM

## 2024-01-16 DIAGNOSIS — F419 Anxiety disorder, unspecified: Secondary | ICD-10-CM | POA: Diagnosis not present

## 2024-01-16 DIAGNOSIS — F411 Generalized anxiety disorder: Secondary | ICD-10-CM

## 2024-01-16 DIAGNOSIS — F339 Major depressive disorder, recurrent, unspecified: Secondary | ICD-10-CM | POA: Diagnosis not present

## 2024-01-16 DIAGNOSIS — F332 Major depressive disorder, recurrent severe without psychotic features: Secondary | ICD-10-CM

## 2024-01-16 MED ORDER — VENLAFAXINE HCL 100 MG PO TABS: 100.0000 mg | ORAL_TABLET | Freq: Two times a day (BID) | ORAL | 0 refills | Status: DC

## 2024-01-16 NOTE — Progress Notes (Signed)
 Careteam: Patient Care Team: Venita Sheffield, MD as PCP - General (Internal Medicine) Jake Bathe, MD as PCP - Cardiology (Cardiology) Glyn Ade, PA-C as Physician Assistant (Dermatology) Eileen Stanford, MD as Referring Physician (Allergy and Immunology) Bernette Redbird, MD as Consulting Physician (Gastroenterology) Axel Filler, MD as Consulting Physician (General Surgery) Venita Lick, MD as Consulting Physician (Orthopedic Surgery) Inis Sizer, MD (Internal Medicine) Regal, Kirstie Peri, DPM as Consulting Physician (Podiatry) Dorisann Frames, MD as Referring Physician (Endocrinology) Essie Hart, MD (Inactive) as Referring Physician (Obstetrics and Gynecology) Golda Acre, OD (Optometry) Barron Alvine, MD (Inactive) (Urology) Krystal Clark, MD as Referring Physician (Rheumatology) Mammography, [facility] (Diagnostic Radiology)  PLACE OF SERVICE:  [facility]  Advanced Directive information    Allergies  Allergen Reactions   Asa [Aspirin] Anaphylaxis    325mg  or higher Pt ok with 81mg  daily dose   Bee Venom Shortness Of Breath, Itching, Swelling and Hives   Peanut-Containing Drug Products Anaphylaxis and Other (See Comments)   Ambien [Zolpidem Tartrate] Other (See Comments)    Hallucinations   Tape Itching, Swelling and Rash   Cipro [Ciprofloxacin Hcl] Nausea And Vomiting   Latex Rash   Minocycline Rash   Shellfish Allergy Rash    Chief Complaint  Patient presents with   Medical Management of Chronic Issues    medication f/u                                          VIDEO VIRTUAL VISIT                                            START TIME  3.05                                              END TIME  3.15    Discussed the use of AI scribe software for clinical note transcription with the patient, who gave verbal consent to proceed.  History of Present Illness   [name] is a 68 year old female who was evaluated by a video virtual visit for  depression  She has been experiencing mood changes and a lack of motivation. Initially, her mood improved after increasing her Effexor dose from 75 mg to 150 mg, but it has declined over the past two weeks. She describes a lack of energy and motivation, although her appetite and sleep remain unchanged. No thoughts of self-harm are present. Reports her husband was recently diagnosed with bladder cancer.  Her current medications include Effexor 150 mg, Wellbutrin, and Abilify. She notes that her anxiety levels are stable. She has four pills of Effexor left at the current dose.  No pain, breathing difficulties, fever, congestion, cough, sore throat, nausea, vomiting, urinary symptoms, or blood in urine or stool.         Review of Systems:  Review of Systems  Constitutional:  Positive for malaise/fatigue. Negative for chills and fever.  HENT:  Negative for congestion and sore throat.   Eyes:  Negative for double vision.  Respiratory:  Negative for cough, sputum production and shortness of breath.   Cardiovascular:  Negative for chest pain, palpitations  and leg swelling.  Gastrointestinal:  Negative for abdominal pain, heartburn and nausea.  Genitourinary:  Negative for dysuria, frequency and hematuria.  Musculoskeletal:  Negative for falls and myalgias.  Neurological:  Negative for dizziness, sensory change and focal weakness.  Psychiatric/Behavioral:  Positive for depression. Negative for suicidal ideas.    Negative unless indicated in HPI.   Past Medical History:  Diagnosis Date   Asthma    Back pain    Chronic fatigue syndrome    Dr. Hyacinth Meeker   Complication of anesthesia    Cough    /SOB, PFT's nl, improved with Bronchodilation 8/11   Dysrhythmia    SVT   Endometrial cancer (HCC)    Esophageal spasm    GERD Dr. Hyacinth Meeker, Dr. Laural Benes; egd 2006 nl; UGI 3/13 Small HH, moderate GERD, nl motility; seen at [facility] egd? neg, sone improvement on sucralfate susp   Fibromyalgia     Foot pain    Dr. Netta Corrigan   Hashimoto's thyroiditis    High triglycerides    Hypothyroidism    Dr. Wynne Dust 4/14   Leukocytoclastic vasculitis (HCC)    Dr. Danella Deis   Low HDL (under 40)    Mitral valve prolapse    Paroxysmal atrial tachycardia (HCC)    Dr. Anne Fu   Patent foramen ovale    Pernicious anemia    Dr. Hyacinth Meeker   PONV (postoperative nausea and vomiting)    along time ago had nausea ans vomiting after surgery-25 years ago   Reflux    ? delayed gastric emptying--GES nl 01/2012 (6% at 2hrs)   RLS (restless legs syndrome)    low ferritin Dr. Hyacinth Meeker   Rosacea    Sleep apnea    Uterine carcinoma (HCC)    Dr. Clifton James   Past Surgical History:  Procedure Laterality Date   ABDOMINAL EXPOSURE N/A 12/07/2021   Procedure: ABDOMINAL EXPOSURE;  Surgeon: Cephus Shelling, MD;  [facility] OR;  Service: Vascular;  Laterality: N/A;   ABDOMINAL HYSTERECTOMY     bladder tack     x 2   BUNIONECTOMY     CALCANEAL OSTEOTOMY Right 01/04/2023   Procedure: CALCANEAL OSTEOTOMY;  Surgeon: Toni Arthurs, MD;  Location: [facility];  Service: Orthopedics;  Laterality: Right;   CHOLECYSTECTOMY  2009   COLONOSCOPY     scr, 12/2008 ([facility]) nl   COLOSTOMY Left 06/05/2018   Procedure: COLOSTOMY;  Surgeon: Axel Filler, MD;  Location: [facility] OR;  Service: General;  Laterality: Left;   COLOSTOMY REVERSAL N/A 10/16/2018   Procedure: HARTMANN'S COLOSTOMY REVERSAL, RIGID PROCTOSCOPY;  Surgeon: Axel Filler, MD;  Location: [facility] ORS;  Service: General;  Laterality: N/A;   GASTROCNEMIUS RECESSION Right 01/04/2023   Procedure: GASTROCNEMIUS recession;  Surgeon: Toni Arthurs, MD;  Location: [facility];  Service: Orthopedics;  Laterality: Right;   HAMMER TOE SURGERY     HYSTERECTOMY ABDOMINAL WITH SALPINGO-OOPHORECTOMY  2008   INCISIONAL HERNIA REPAIR  04/09/2019   x2   INCISIONAL HERNIA REPAIR N/A 04/09/2019   Procedure: INCISIONAL HERNIA REPAIR WITH MESH;  Surgeon: Axel Filler, MD;  Location: [facility] OR;  Service: General;  Laterality: N/A;   LAPAROSCOPY N/A 10/16/2018   Procedure: LAPAROSCOPY DIAGNOSTIC WITH LYSIS OF ADHESIONS;  Surgeon: Axel Filler, MD;  Location: [facility];  Service: General;  Laterality: N/A;   LUNG BIOPSY     L Lower lung pulm nodules, largest 4.11mm, low risk, repeat CT in 1 yr now following with pulm [facility]; 9/13 Stable tiny  lung nodules, no f/u needed   LYMPH NODE DISSECTION  2008   LYSIS OF ADHESION N/A 04/09/2019   Procedure: OPEN LYSIS OF ADHESION;  Surgeon: Axel Filler, MD;  Location: [facility] OR;  Service: General;  Laterality: N/A;   METATARSAL OSTEOTOMY     NECK SURGERY     x 2   OBLIQUE LUMBAR INTERBODY FUSION 1 LEVEL WITH PERCUTANEOUS SCREWS N/A 12/07/2021   Procedure: OBLIQUE LUMBAR INTERBODY FUSION 1 LEVEL WITH PERCUTANEOUS SCREWS (OLIF L4-5 WITH POSTERIOR SPINAL FUSION AND PEDICLE SCREWS);  Surgeon: Venita Lick, MD;  Location[facility] OR;  Service: Orthopedics;  Laterality: N/A;  4 HRS DR. CLARK TO DO APPROACH LEFT TAP BLOCK WITH EXPAREL 3 C-BED   PARTIAL COLECTOMY N/A 06/05/2018   Procedure: EXPLORATORY LAPAROTOMY AND PARTIAL COLECTOMY;  Surgeon: Axel Filler, MD;  Location: [facility] OR;  Service: General;  Laterality: N/A;   POSTERIOR TIBIAL TENDON REPAIR Right 01/04/2023   Procedure: POSTERIOR TIBIAL TENDON TENOLYSIS AND  flexor digitorum longus TRANSFER TO NAVICULAR;  Surgeon: Toni Arthurs, MD;  Location: [facility];  Service: Orthopedics;  Laterality: Right;   TONSILLECTOMY     US ECHOCARDIOGRAPHY     with Nuclear test, Dr. Anne Fu, Low risk   VESICOVAGINAL FISTULA CLOSURE W/ TAH     Social History:   reports that she has never smoked. She has never used smokeless tobacco. She reports current alcohol use. She reports that she does not use drugs.  Family History  Problem Relation Age of Onset   Hashimoto's thyroiditis Mother    High blood pressure Mother    Stroke Mother    Other Mother         Borderline DM   Alzheimer's disease Mother    Ulcerative colitis Father    Meniere's disease Father    Hearing loss Father    Stroke Father    Stroke Sister     Medications: Patient's Medications  New Prescriptions   VENLAFAXINE (EFFEXOR) 100 MG TABLET    Take 1 tablet (100 mg total) by mouth 2 (two) times daily.  Previous Medications   ACETAMINOPHEN (TYLENOL) 500 MG TABLET    Take 2 tablets (1,000 mg total) by mouth every 6 (six) hours as needed.   ALBUTEROL (PROVENTIL HFA;VENTOLIN HFA) 108 (90 BASE) MCG/ACT INHALER    Inhale 2 puffs into the lungs every 4 (four) hours as needed for wheezing or shortness of breath.   ARIPIPRAZOLE (ABILIFY) 2 MG TABLET    TAKE 1 TABLET BY MOUTH DAILY   ASCORBIC ACID (VITAMIN C PO)    Take 1 tablet by mouth daily.   ASPIRIN EC 81 MG TABLET    Take 81 mg by mouth at bedtime.   ATORVASTATIN (LIPITOR) 10 MG TABLET    TAKE 1 TABLET BY MOUTH DAILY   BUPROPION (WELLBUTRIN XL) 150 MG 24 HR TABLET    TAKE 3 TABLETS BY MOUTH DAILY   CHOLECALCIFEROL (VITAMIN D-3 PO)    Take 1 capsule by mouth daily.   CYANOCOBALAMIN (VITAMIN B12) 1000 MCG/ML INJECTION    Inject 1 mL (1,000 mcg total) into the muscle every 30 (thirty) days.   DOCUSATE SODIUM (COLACE) 100 MG CAPSULE    Take 1 capsule (100 mg total) by mouth 2 (two) times daily. While taking narcotic pain medicine.   EPINEPHRINE 0.3 MG/0.3 ML IJ SOAJ INJECTION    Inject 0.3 mg into the muscle as needed for anaphylaxis.   IPRATROPIUM (ATROVENT) 0.06 % NASAL SPRAY    Place  2 sprays into both nostrils 2 (two) times daily as needed for rhinitis.   KETOTIFEN (ZADITOR) 0.025 % OPHTHALMIC SOLUTION    Place 1 drop into both eyes daily.   LEVOCETIRIZINE (XYZAL) 5 MG TABLET    SMARTSIG:1 Tablet(s) By Mouth Every Evening   LEVOTHYROXINE (SYNTHROID) 150 MCG TABLET    Take 150 mcg by mouth daily.   METOPROLOL SUCCINATE (TOPROL-XL) 25 MG 24 HR TABLET    Take 1 tablet (25 mg total) by mouth daily.   NAPROXEN SODIUM (ALEVE) 220 MG  TABLET    Take 220 mg by mouth daily as needed (general pain, headache).   NITROGLYCERIN (NITROSTAT) 0.4 MG SL TABLET    Place 1 tablet (0.4 mg total) under the tongue every 5 (five) minutes as needed for chest pain.   OMALIZUMAB (XOLAIR) 150 MG INJECTION    300 mg every 30 (thirty) days. Every 29th or 30th   PANTOPRAZOLE (PROTONIX) 40 MG TABLET    Take 40 mg by mouth 2 (two) times daily.   POLYETHYLENE GLYCOL (MIRALAX / GLYCOLAX) 17 G PACKET    Take 17 g by mouth daily as needed (constipation).   PRAMIPEXOLE (MIRAPEX) 0.125 MG TABLET    Take 3 tablets (0.375 mg total) by mouth at bedtime as needed (restless legs syndrome).   PROBIOTIC PRODUCT (PROBIOTIC PO)    Take 1 capsule by mouth daily.   TRELEGY ELLIPTA 200-62.5-25 MCG/ACT AEPB    Inhale 1 puff into the lungs at bedtime.  Modified Medications   No medications on file  Discontinued Medications   VENLAFAXINE XR (EFFEXOR XR) 150 MG 24 HR CAPSULE    Take 1 capsule (150 mg total) by mouth daily with breakfast.    Physical Exam: There were no vitals filed for this visit. There is no height or weight on file to calculate BMI. BP Readings from Last 3 Encounters:  12/17/23 118/70  11/28/23 124/66  10/03/23 122/74   Wt Readings from Last 3 Encounters:  12/17/23 149 lb 6.4 oz (67.8 kg)  11/28/23 149 lb (67.6 kg)  10/03/23 153 lb 9.6 oz (69.7 kg)      Basic Metabolic Panel: Recent Labs    06/13/23 1538 06/14/23 0130 06/15/23 0326 06/16/23 0353 11/28/23 1129  NA 139   < > 138 137 142  K 4.3   < > 3.8 3.6 4.5  CL 105   < > 100 100 106  CO2 26   < > 24 23 29   GLUCOSE 91   < > 55* 69* 82  BUN 12   < > 13 6* 25  CREATININE 0.90   < > 0.92 0.88 0.94  CALCIUM 9.3   < > 9.1 8.6* 10.3  MG 2.2  --  2.0 1.8  --   PHOS 3.8  --   --   --   --   TSH  --   --   --   --  1.12   < > = values in this interval not displayed.   Liver Function Tests: Recent Labs    06/13/23 1005 06/13/23 1538 06/14/23 0130  AST 22 19 17   ALT 16 14 14    ALKPHOS 98 86 83  BILITOT 0.7 0.8 1.1  PROT 7.0 6.7 6.8  ALBUMIN 4.0 3.8 3.7   Recent Labs    06/13/23 1005  LIPASE 33   No results for input(s): "AMMONIA" in the last 8760 hours. CBC: Recent Labs    06/13/23 1005 06/13/23 1013 06/14/23 0130  06/15/23 0818 06/16/23 0353  WBC 13.9*   < > 13.0* 9.7 10.1  NEUTROABS 11.4*  --   --   --   --   HGB 14.5   < > 13.7 12.9 12.0  HCT 44.5   < > 41.2 38.3 34.6*  MCV 94.1   < > 96.9 95.5 91.3  PLT 402*   < > 373 326 327   < > = values in this interval not displayed.   Lipid Panel: Recent Labs    09/06/23 0921  CHOL 134  HDL 38*  LDLCALC 70  TRIG 956  CHOLHDL 3.5   TSH: Recent Labs    11/28/23 1129  TSH 1.12   A1C: Lab Results  Component Value Date   HGBA1C 5.0 06/13/2023    Assessment and Plan    Major Depressive Disorder Mood decline despite Effexor increase to 150 mg.  No self-harm thoughts. Current regimen includes Effexor, Wellbutrin, and Abilify. Considered increasing Effexor to 225 mg.   Will refer to psychiatry - Increase Effexor to 100 mg twice daily, total 200 mg/day. - Send prescription to [facility]. - Finish current 150 mg Effexor before new dose. -   Anxiety Anxiety unchanged. Twice-daily Effexor may stabilize medication levels and improve symptoms.  Referral to psychiatry            No follow-ups on file.:   [name]

## 2024-01-16 NOTE — Progress Notes (Signed)
 This service is provided via telemedicine  No vital signs collected/recorded due to the encounter was a telemedicine visit.   Location of patient (ex: home, work):  Home   Patient consents to a telephone visit:  Yes, 01/16/24   Location of the provider (ex: office, home):  [facility]  Name of any referring provider:  Venita Sheffield, MD   Names of all persons participating in the telemedicine service and their role in the encounter: Jinny Blossom, Venita Sheffield, MD , and patient  Time spent on call:  11 minutes

## 2024-01-16 NOTE — Progress Notes (Signed)
 I connected with  [name] on 01/16/24 by a video enabled telemedicine application and verified that I am speaking with the correct person using two identifiers.   I discussed the limitations of evaluation and management by telemedicine. The patient expressed understanding and agreed to proceed.

## 2024-01-22 ENCOUNTER — Ambulatory Visit: Payer: Self-pay | Admitting: Sports Medicine

## 2024-01-22 MED ORDER — CITALOPRAM HYDROBROMIDE 20 MG PO TABS: 20.0000 mg | ORAL_TABLET | Freq: Every day | ORAL | 0 refills | Status: DC

## 2024-01-22 NOTE — Telephone Encounter (Signed)
 Inez Pilgrim, RN  [facility] Clinical35 minutes ago (2:44 PM)    Lorain Childes- please advise   Inez Pilgrim, RN35 minutes ago (2:44 PM)       Summary: Stopping medication advise needed    Copied From CRM [id number]. Reason for Triage: Please call Pt. Gutman regarding the medication venlafaxine (EFFEXOR) 100 MG tablet should is considering stopping it. Please advise        Chief Complaint: Medication question Symptoms: headache, constipation Frequency: constant Pertinent Negatives: Patient denies . Disposition: [] ED /[] Urgent Care (no appt availability in office) / [] Appointment(In office/virtual)/ []  [place] Virtual Care/ [] Home Care/ [] Refused Recommended Disposition /[] [place] Mobile Bus/ [x]  Follow-up with PCP Additional Notes: Pt concerned about stronger dose of Effexor causing headache, nausea, and dizziness but most importantly constipation. Pt report history of intestinal blockage requiring ostomy so she's very concerned about the constipation. Pt sts that she stopped taking the Effexor 3 days ago and wants to possibly explore other medication options or coming off the medication completely.    Reason for Disposition  [1] Caller has URGENT medicine question about med that PCP or specialist prescribed AND [2] triager unable to answer question  Answer Assessment - Initial Assessment Questions 1. NAME of MEDICINE: "What medicine(s) are you calling about?"     Effexor  2. QUESTION: "What is your question?" (e.g., double dose of medicine, side effect)     Concerned about the new dose and constipation side effects  3. PRESCRIBER: "Who prescribed the medicine?" Reason: if prescribed by specialist, call should be referred to that group.     Veludandi  4. SYMPTOMS: "Do you have any symptoms?" If Yes, ask: "What symptoms are you having?"  "How bad are the symptoms (e.g., mild, moderate, severe)     Constipation, headache, significant fatigue  Protocols used: Medication Question  Call-A-AH        Message sent to Venita Sheffield, MD

## 2024-01-22 NOTE — Telephone Encounter (Signed)
 Summary: Stopping medication advise needed   Copied From CRM [id number]. Reason for Triage: Please call Pt. Vasseur regarding the medication venlafaxine (EFFEXOR) 100 MG tablet should is considering stopping it. Please advise      Chief Complaint: Medication question Symptoms: headache, constipation Frequency: constant Pertinent Negatives: Patient denies . Disposition: [] ED /[] Urgent Care (no appt availability in office) / [] Appointment(In office/virtual)/ []  [place] [facility]/ [] Home Care/ [] Refused Recommended Disposition /[] [place] Mobile Bus/ [x]  Follow-up with PCP Additional Notes: Pt concerned about stronger dose of Effexor causing headache, nausea, and dizziness but most importantly constipation. Pt report history of intestinal blockage requiring ostomy so she's very concerned about the constipation. Pt sts that she stopped taking the Effexor 3 days ago and wants to possibly explore other medication options or coming off the medication completely.    Reason for Disposition  [1] Caller has URGENT medicine question about med that PCP or specialist prescribed AND [2] triager unable to answer question  Answer Assessment - Initial Assessment Questions 1. NAME of MEDICINE: "What medicine(s) are you calling about?"     Effexor  2. QUESTION: "What is your question?" (e.g., double dose of medicine, side effect)     Concerned about the new dose and constipation side effects  3. PRESCRIBER: "Who prescribed the medicine?" Reason: if prescribed by specialist, call should be referred to that group.     Veludandi  4. SYMPTOMS: "Do you have any symptoms?" If Yes, ask: "What symptoms are you having?"  "How bad are the symptoms (e.g., mild, moderate, severe)     Constipation, headache, significant fatigue  Protocols used: Medication Question Call-A-AH

## 2024-01-22 NOTE — Telephone Encounter (Signed)
 Jill Sheffield, MD  P[facility] Clinical32 minutes ago (3:43 PM)    Plz call the patient and inform to stop effexor and start celexa 20 mg Follow up with psychiatry  Patient agree and will follow-up with psychiatry

## 2024-01-24 ENCOUNTER — Ambulatory Visit: Payer: Self-pay

## 2024-01-24 ENCOUNTER — Telehealth: Payer: Self-pay

## 2024-01-24 MED ORDER — CITALOPRAM HYDROBROMIDE 20 MG PO TABS: 20.0000 mg | ORAL_TABLET | Freq: Every day | ORAL | 0 refills | Status: DC

## 2024-01-24 NOTE — Telephone Encounter (Signed)
 Copied from CRM [id number]. Topic: Clinical - Prescription Issue >> Jan 24, 2024  9:57 AM Elmarie Shiley H wrote: Reason for CRM: Patient [name] called to request new Celexa prescription be sent to the below pharmacy. Patient called earlier today reporting withdrawal symptoms. Please expedite.   citalopram (CELEXA) 20 MG tablet [[id number]]  [facility] [place], [place] - [address] [facility] [address] [place] [address] Phone: [phone]  Fax: [phone] DEA #: -- [id number] Reason: --   Patient aware rx sent, it appears on 01/22/24 the wrong class (phone in) was selected instead of normal which sends rx electronically

## 2024-01-24 NOTE — Telephone Encounter (Signed)
 I spoke with this patient and informed her that celexa sent to pharmacy- see other message that was converted to a telephone call today: RX was celexa that was to be sent in on 01/22/24 had a class of "Phoned in" instead of normal, which sends it to the pharmacy electronically.  I will send this to Venita Sheffield, MD as a Lorain Childes

## 2024-01-24 NOTE — Telephone Encounter (Signed)
 Chief Complaint: Worsened withdraw symptoms from coming off Effexor cold [place] Symptoms: sweating, nausea, body aches Frequency: worsened over two days Pertinent Negatives: Patient denies fever, chest pain, Disposition: [] ED /[] Urgent Care (no appt availability in office) / [] Appointment(In office/virtual)/ []  [place] Virtual Care/ [] Home Care/ [] Refused Recommended Disposition /[] [place] Mobile Bus/ [x]  Follow-up with PCP Additional Notes: Patient called and stated she was having worsened symptoms since she last called in on Tuesday, stating she is having sweating nearly around the clock and nausea that she is treating with Zofran. Advised patient that PCP put in notes for patient to pick up Celexa and begin taking it, and follow up with Psychiatry. Patient states she never received a text that it was ready at pharmacy but she will follow up now to check on it, and states she hasn't felt well enough to call Psychiatry but is going to do that now as well. Advised patient to monitor for cardiac symptoms as well. Patient will follow up with PCP if symptoms worsen and not managed by Celexa and Psychiatry.   Copied from CRM [id number]. Topic: Clinical - Red Word Triage >> Jan 24, 2024  9:33 AM Dennison Nancy wrote: Red Word that prompted transfer to Nurse Triage: patient spoke nurse Cherelle regarding  venlafaxine (EFFEXOR) 100 MG tablet ,having side effects having withdraw symptom , patient stop taking the medication having really bad sweats, nausea really bad haven't eaten in 24 hours ,patient read online of side effects and stated she is have some brain zap can't really describe how it feels . Reason for Disposition  Unexplained nausea  Answer Assessment - Initial Assessment Questions 1. NAUSEA SEVERITY: "How bad is the nausea?" (e.g., mild, moderate, severe; dehydration, weight loss)   - MILD: loss of appetite without change in eating habits   - MODERATE: decreased oral intake without significant  weight loss, dehydration, or malnutrition   - SEVERE: inadequate caloric or fluid intake, significant weight loss, symptoms of dehydration     Moderate and worsening as she has been coming off the Effexor 2. ONSET: "When did the nausea begin?"     Last two days 3. VOMITING: "Any vomiting?" If Yes, ask: "How many times today?"     N/a 4. RECURRENT SYMPTOM: "Have you had nausea before?" If Yes, ask: "When was the last time?" "What happened that time?"     N/a 5. CAUSE: "What do you think is causing the nausea?"     Withdraw from coming off Effexor  Protocols used: Nausea-A-AH

## 2024-01-31 DIAGNOSIS — L501 Idiopathic urticaria: Secondary | ICD-10-CM | POA: Diagnosis not present

## 2024-02-05 DIAGNOSIS — F411 Generalized anxiety disorder: Secondary | ICD-10-CM | POA: Insufficient documentation

## 2024-02-11 ENCOUNTER — Telehealth: Payer: Self-pay

## 2024-02-11 DIAGNOSIS — R930 Abnormal findings on diagnostic imaging of skull and head, not elsewhere classified: Secondary | ICD-10-CM

## 2024-02-11 NOTE — Telephone Encounter (Signed)
 Mast, Man X, NP  You24 minutes ago (4:34 PM)    Do you want me to make an referral to neurology?  Thanks

## 2024-02-11 NOTE — Telephone Encounter (Signed)
 Copied from CRM [id number]. Topic: Referral - Request for Referral >> Feb 11, 2024  3:18 PM Blair Bumpers wrote: Did the patient discuss referral with their provider in the last year? Yes (If No - schedule appointment) (If Yes - send message)  Appointment offered? No  Type of order/referral and detailed reason for visit: Referral to [facility]   Preference of office, provider, location: [facility]  If referral order, have you been seen by this specialty before? Yes, she states she was a former patient 5 years ago (If Yes, this issue or another issue? When? Where?  Can we respond through MyChart? Yes

## 2024-02-11 NOTE — Telephone Encounter (Signed)
 Please read message patient left and make decision based off provider notes and patient request. Message routed back to Mast, NP

## 2024-02-11 NOTE — Telephone Encounter (Signed)
 Message routed to Mast, NP due to PCP Venita Sheffield, MD being out of office.

## 2024-02-12 ENCOUNTER — Encounter: Payer: Self-pay | Admitting: Nurse Practitioner

## 2024-02-12 DIAGNOSIS — R930 Abnormal findings on diagnostic imaging of skull and head, not elsewhere classified: Secondary | ICD-10-CM | POA: Insufficient documentation

## 2024-02-12 NOTE — Telephone Encounter (Signed)
 Please refer to [place] documentation. Message routed to Mast, NP.

## 2024-02-12 NOTE — Telephone Encounter (Signed)
 Mast, Man X, NP  You10 minutes ago (10:15 AM)    Would you please help me to check the message to figure out what to do?  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

## 2024-02-12 NOTE — Telephone Encounter (Signed)
 Mast, Man X, NP  You31 minutes ago (11:07 AM)    Your message said the patient needs Neurology referral. Dr. Marily Shows noted stated referral to Psychiatry and the patient agree with f/u Psychiatry she had. Please call the patient again to clarify her need and let me know.  Thank you

## 2024-02-12 NOTE — Telephone Encounter (Signed)
 Mast, Man X, NP  You1 hour ago (11:48 AM)    Thank you The patient needs f/u Neurology she saw before Let me know if anything else I can do for her.    ManXie patient needs a new referral. Referral order is pending and needs diagnosis association and to be signed

## 2024-02-13 ENCOUNTER — Encounter: Payer: Self-pay | Admitting: Physician Assistant

## 2024-02-21 ENCOUNTER — Other Ambulatory Visit: Payer: Self-pay | Admitting: Sports Medicine

## 2024-02-21 NOTE — Telephone Encounter (Signed)
 High risk warning populated when attempting to refill medication  Please review and approval if appropriate   Thanks,  [facility] W/CMA

## 2024-02-27 ENCOUNTER — Other Ambulatory Visit: Payer: Self-pay | Admitting: Family Medicine

## 2024-02-27 ENCOUNTER — Other Ambulatory Visit: Payer: Self-pay | Admitting: Sports Medicine

## 2024-02-27 DIAGNOSIS — F339 Major depressive disorder, recurrent, unspecified: Secondary | ICD-10-CM

## 2024-02-28 NOTE — Telephone Encounter (Signed)
 Pharmacy requested refill.  Pended and sent to Dr. Nathaneil Bakes for approval due to HIGH ALERT Warning.

## 2024-02-29 DIAGNOSIS — L501 Idiopathic urticaria: Secondary | ICD-10-CM | POA: Diagnosis not present
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
## 2024-03-04 ENCOUNTER — Ambulatory Visit (HOSPITAL_BASED_OUTPATIENT_CLINIC_OR_DEPARTMENT_OTHER): Payer: Self-pay | Admitting: Psychiatry

## 2024-03-04 ENCOUNTER — Other Ambulatory Visit: Payer: Self-pay

## 2024-03-04 ENCOUNTER — Encounter (HOSPITAL_COMMUNITY): Payer: Self-pay | Admitting: Psychiatry

## 2024-03-04 VITALS — BP 133/72 | HR 75 | Ht 65.0 in | Wt 155.0 lb

## 2024-03-04 DIAGNOSIS — F411 Generalized anxiety disorder: Secondary | ICD-10-CM

## 2024-03-04 DIAGNOSIS — F3340 Major depressive disorder, recurrent, in remission, unspecified: Secondary | ICD-10-CM | POA: Diagnosis not present

## 2024-03-04 MED ORDER — CITALOPRAM HYDROBROMIDE 10 MG PO TABS: 10.0000 mg | ORAL_TABLET | Freq: Every day | ORAL | 0 refills | Status: DC

## 2024-03-04 MED ORDER — FLUOXETINE HCL 10 MG PO CAPS: ORAL_CAPSULE | ORAL | 2 refills | Status: DC

## 2024-03-06 ENCOUNTER — Telehealth (HOSPITAL_COMMUNITY): Payer: Self-pay

## 2024-03-06 NOTE — Telephone Encounter (Signed)
 I called patient back and left voicemail with appropriate information for her

## 2024-03-06 NOTE — Telephone Encounter (Signed)
 Patient saw you earlier this week and she noted that on her AVS it says she takes 150 mg of Bupropion , but she takes 450 mg. She was not sure if that would have an effect on the medication that you prescribed. Please review and advise, thank you

## 2024-03-07 ENCOUNTER — Ambulatory Visit: Payer: Self-pay

## 2024-03-07 NOTE — Telephone Encounter (Signed)
  Chief Complaint: uti sx  Symptoms: urinary frequency, urgency, vaginal pain Frequency: onset 03/06/24 Pertinent Negatives: Patient denies fever, back/abd/flank pain, hematuria and all other symptoms Disposition: [] ED /[] Urgent Care (no appt availability in office) / [x] Appointment(In office/virtual)/ []  [place] [facility]/ [] Home Care/ [] Refused Recommended Disposition /[] [place] [facility]/ []  Follow-up with PCP Additional Notes:  Called in requesting appointment today or Monday to evaluate for UTI. She has a history of recurring uti, feels she may be developing one at this time. Yesterday developed sharp burning and stabbing pain in vaginal area as well as urinary urgency and increased frequency. Took dose of Azo prior to calling in, pending effect. Denies all other symptoms. Acute evaluation advised, no appointments available in practice location until Monday. Per patient request scheduled acute visit on Monday with alternate provider, plan to seek treatment at urgent care if symptoms progress, develops fever, abdominal/back/flank pain. Educated on care advice as documented in protocol, patient verbalized understanding. Discussed reasons to call back or to seek evaluation at UC/ER.    Copied from CRM [id number]. Topic: Clinical - Red Word Triage >> Mar 07, 2024 12:22 PM Brittney F wrote: Red Word that prompted transfer to Nurse Triage:   Possible UTI; Sharp stabbing pain in vaginal area; high frequency of urination Reason for Disposition  Age > 50 years  Protocols used: Urination Pain - Female-A-AH

## 2024-03-10 ENCOUNTER — Ambulatory Visit: Admitting: Adult Health

## 2024-03-15 NOTE — Progress Notes (Signed)
 Assessment/Plan:   [name] is a very pleasant 68 y.o. 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 MoCA today is 28/30. She reports that she was going to be a part of a study for  [employer] ([id number]) in Early Alzheimer's Disease (TRAILRUNNER-ALZ 3) in view that her mother had AD. She was not able to go through the study because she had surgical procedure at the time of the study initiation so she was dropped of it. She reports that she is APOE E3/E3. She is going to bring the pertinent labs for review.  Workup is in progress.  Patient is able to participate on ADLs and continues to drive without significant difficulties.  MoCA today is 28/30   Memory Impairment of unclear etiology  MRI brain with and without contrast to assess for underlying structural abnormality and assess vascular load  Neurocognitive testing to further evaluate cognitive concerns and determine other underlying cause of memory changes, including potential contribution from sleep, anxiety, attention, or depression  Check B12  Continue to control mood as per psychiatry.  She is on Wellbutrin , Celexa , Prozac , Abilify  Recommend good control of cardiovascular risk factors Folllow up in 3-4  months   Subjective:   The patient is here alone.   How long did patient have memory difficulties?  Since the 1990s, initially attributed to chronic fatigue syndrome and seen by [facility] in 2020 without any findings suggestive of neurodegenerative disease, there was concern for vascular disease. No follow up since then. She was never placed on antidementia medication.  Patient some difficulty remembering new information, conversations and names.   Long-term memory is good. repeats oneself?  "Sometimes" Disoriented when walking into a room?  Patient denies except occasionally not remembering what patient came to the room for    Leaving objects in unusual places? Denies.   Wandering behavior?  denies .  Any personality changes? "I have been a little more irritable lately". Psychiatry placed her with Prozac  and seems to be therapeutic. Any history of depression?:  Endorsed, with recurrence on March 2025, this is being followed by behavioral therapy and psychiatry. Hallucinations or paranoia?  Denies   Seizures?  Denies    Any sleep changes?   Sleeps well denies vivid dreams  She has RLS.  Denies sleepwalking   Sleep apnea?  Endorsed, on CPAP. Any hygiene concerns?  Denies   Independent of bathing and dressing?  Endorsed  Does the patient needs help with medications? Patient is in charge  Who is in charge of the finances? Patient is in charge    Any changes in appetite?  Denies     Patient have trouble swallowing? Denies.   Does the patient cook? Not as much, denies forgetting her own recipes    Any kitchen accidents such as leaving the stove on? Denies.   Any history of headaches?   Intermittently "but not migraines", tension type Chronic pain ? Denies.   Ambulates with difficulty?  Denies. "Fairly active".   Recent falls or head injuries?  On November 2024 the patient had a mechanical fall with head injury  R frontal area, no LOC, negative CT of the head Vision changes? Denies.   Any stroke like symptoms? Denies.   Any tremors?   Denies.   Any  anosmia?  Denies.   Any incontinence of urine?  Some frequency, urgency, secondary to UTI May 2025 Any bowel dysfunction? IBS with constipation. Patient lives with her husband   History of heavy alcohol intake? Denies.   History of heavy tobacco use? Denies.   Family history of dementia?  Mother had Alzheimer's disease.  Strong family history of cerebrovascular disease, 1 sister died with a  stroke. Does patient drive? Yes, denies getting lost PT [employer]   Pertinent labs January 2025 TSH 1.12  Past Medical History:  Diagnosis Date   Asthma    Back pain    Chronic fatigue syndrome    Dr. Annabell Key   Complication of anesthesia    Cough    /SOB, PFT's nl, improved with Bronchodilation 8/11   Dysrhythmia    SVT   Endometrial cancer (HCC)    Esophageal spasm    GERD Dr. Annabell Key, Dr. Lincoln Renshaw; egd 2006 nl; UGI 3/13 [facility], moderate GERD, nl motility; seen at [facility] ([place]), egd? neg, sone improvement on sucralfate susp   Fibromyalgia    Foot pain    Dr. Assunta Lax   Hashimoto's thyroiditis    High triglycerides    Hypothyroidism    Dr. Darra Elliot 4/14   Leukocytoclastic vasculitis (HCC)    Dr. Alanda Allegra   Low HDL (under 40)    Mitral valve prolapse    Paroxysmal atrial tachycardia (HCC)    Dr. Renna Cary   Patent foramen ovale    Pernicious anemia    Dr. Annabell Key   PONV (postoperative nausea and vomiting)    along time ago had nausea ans vomiting after surgery-25 years ago   Reflux    ? delayed gastric emptying--GES nl 01/2012 (6% at 2hrs)   RLS (restless legs syndrome)    low ferritin Dr. Annabell Key   Rosacea    Sleep apnea    Uterine carcinoma (HCC)    Dr. Monroe Antigua     Past Surgical History:  Procedure Laterality Date   ABDOMINAL EXPOSURE N/A 12/07/2021   Procedure: ABDOMINAL EXPOSURE;  Surgeon: Young Hensen, MD;  Location: [facility] OR;  Service: Vascular;  Laterality: N/A;   ABDOMINAL HYSTERECTOMY     bladder tack     x 2   BUNIONECTOMY     CALCANEAL OSTEOTOMY Right 01/04/2023   Procedure: CALCANEAL OSTEOTOMY;  Surgeon: Amada Backer, MD;  Location: [facility];  Service: Orthopedics;  Laterality: Right;   CHOLECYSTECTOMY  2009   COLONOSCOPY     scr, 12/2008 ([facility]) nl   COLOSTOMY Left 06/05/2018   Procedure: COLOSTOMY;  Surgeon: Shela Derby, MD;  Location: [facility];  Service: General;  Laterality: Left;   COLOSTOMY  REVERSAL N/A 10/16/2018   Procedure: HARTMANN'S COLOSTOMY REVERSAL, RIGID PROCTOSCOPY;  Surgeon: Shela Derby, MD;  Location: [facility] ORS;  Service: General;  Laterality: N/A;   GASTROCNEMIUS RECESSION Right 01/04/2023   Procedure: GASTROCNEMIUS recession;  Surgeon: Amada Backer, MD;  Location: [facility];  Service: Orthopedics;  Laterality: Right;   HAMMER TOE SURGERY     HYSTERECTOMY ABDOMINAL WITH SALPINGO-OOPHORECTOMY  2008   INCISIONAL HERNIA REPAIR  04/09/2019   x2   INCISIONAL HERNIA REPAIR N/A 04/09/2019   Procedure: INCISIONAL HERNIA REPAIR WITH MESH;  Surgeon: Shela Derby, MD;  Location: [facility] OR;  Service: General;  Laterality: N/A;   LAPAROSCOPY N/A 10/16/2018   Procedure: LAPAROSCOPY DIAGNOSTIC WITH LYSIS OF ADHESIONS;  Surgeon: Shela Derby, MD;  Location: [facility];  Service: General;  Laterality: N/A;  LUNG BIOPSY     L Lower lung pulm nodules, largest 4.73mm, low risk, repeat CT in 1 yr now following with pulm at [facility]; 9/13 Stable tiny lung nodules, no f/u needed   LYMPH NODE DISSECTION  2008   LYSIS OF ADHESION N/A 04/09/2019   Procedure: OPEN LYSIS OF ADHESION;  Surgeon: Shela Derby, MD;  Location: [facility] OR;  Service: General;  Laterality: N/A;   METATARSAL OSTEOTOMY     NECK SURGERY     x 2   OBLIQUE LUMBAR INTERBODY FUSION 1 LEVEL WITH PERCUTANEOUS SCREWS N/A 12/07/2021   Procedure: OBLIQUE LUMBAR INTERBODY FUSION 1 LEVEL WITH PERCUTANEOUS SCREWS (OLIF L4-5 WITH POSTERIOR SPINAL FUSION AND PEDICLE SCREWS);  Surgeon: Mort Ards, MD;  Location: [facility] OR;  Service: Orthopedics;  Laterality: N/A;  4 HRS DR. CLARK TO DO APPROACH LEFT TAP BLOCK WITH EXPAREL  3 C-BED   PARTIAL COLECTOMY N/A 06/05/2018   Procedure: EXPLORATORY LAPAROTOMY AND PARTIAL COLECTOMY;  Surgeon: Shela Derby, MD;  Location: [facility] OR;  Service: General;  Laterality: N/A;   POSTERIOR TIBIAL TENDON REPAIR Right 01/04/2023   Procedure: POSTERIOR TIBIAL TENDON TENOLYSIS AND  flexor  digitorum longus TRANSFER TO NAVICULAR;  Surgeon: Amada Backer, MD;  Location: [facility];  Service: Orthopedics;  Laterality: Right;   TONSILLECTOMY     US  ECHOCARDIOGRAPHY     with Nuclear test, Dr. Renna Cary, Low risk   VESICOVAGINAL FISTULA CLOSURE W/ TAH       Allergies  Allergen Reactions   Asa [Aspirin ] Anaphylaxis    325mg  or higher Pt ok with 81mg  daily dose   Bee Venom Shortness Of Breath, Itching, Swelling and Hives   Peanut-Containing Drug Products Anaphylaxis and Other (See Comments)   Ambien [Zolpidem Tartrate] Other (See Comments)    Hallucinations   Tape Itching, Swelling and Rash   Cipro  [Ciprofloxacin  Hcl] Nausea And Vomiting   Latex Rash   Minocycline Rash   Shellfish Allergy Rash    Current Outpatient Medications  Medication Instructions   acetaminophen  (TYLENOL ) 1,000 mg, Oral, Every 6 hours PRN   albuterol  (PROVENTIL  HFA;VENTOLIN  HFA) 108 (90 Base) MCG/ACT inhaler 2 puffs, Every 4 hours PRN   ARIPiprazole  (ABILIFY ) 2 mg, Oral, Daily   Ascorbic Acid (VITAMIN C PO) 1 tablet, Daily   aspirin  EC 81 mg, Daily at bedtime   atorvastatin  (LIPITOR) 10 mg, Oral, Daily   buPROPion  (WELLBUTRIN  XL) 450 mg, Oral, Daily   Cholecalciferol  (VITAMIN D -3 PO) 1 capsule, Daily   citalopram  (CELEXA ) 10 mg, Oral, Daily   cyanocobalamin  (VITAMIN B12) 1,000 mcg, Intramuscular, Every 30 days   docusate sodium  (COLACE) 100 mg, Oral, 2 times daily, While taking narcotic pain medicine.   EPINEPHrine  (EPI-PEN) 0.3 mg, As needed   FLUoxetine  (PROZAC ) 10 MG capsule Take 1 capsule (10 mg total) by mouth daily for 7 days, THEN 2 capsules (20 mg total) daily for 23 days.   ipratropium (ATROVENT ) 0.06 % nasal spray 2 sprays, 2 times daily PRN   ketotifen  (ZADITOR ) 0.025 % ophthalmic solution 1 drop, Daily   levocetirizine (XYZAL) 5 MG tablet SMARTSIG:1 Tablet(s) By Mouth Every Evening   levothyroxine  (SYNTHROID ) 150 mcg, Daily   metoprolol  succinate (TOPROL -XL) 25 mg, Oral,  Daily   naproxen sodium (ALEVE) 220 mg, Daily PRN   nitroGLYCERIN  (NITROSTAT ) 0.4 mg, Sublingual, Every 5 min PRN   pantoprazole  (PROTONIX ) 40 mg, 2 times daily   polyethylene glycol (MIRALAX  / GLYCOLAX ) 17 g, Daily PRN   pramipexole  (MIRAPEX ) 0.375 mg, Oral, At  bedtime PRN   Probiotic Product (PROBIOTIC PO) 1 capsule, Daily   TRELEGY ELLIPTA 200-62.5-25 MCG/ACT AEPB 1 puff, Daily at bedtime   Xolair  300 mg, Every 30 days     VITALS:   Vitals:   03/17/24 1317  BP: (!) 118/57  Pulse: 62  Resp: 20  SpO2: 98%  Weight: 156 lb (70.8 kg)      PHYSICAL EXAM   HEENT:  Normocephalic, atraumatic. The superficial temporal arteries are without ropiness or tenderness. Cardiovascular: Regular rate and rhythm. Lungs: Clear to auscultation bilaterally. Neck: There are no carotid bruits noted bilaterally.  NEUROLOGICAL:     No data to display             12/17/2023   11:04 AM 07/22/2019    3:50 PM  MMSE - Mini Mental State Exam  Orientation to time 5 5  Orientation to Place 5 5  Registration 3 3  Attention/ Calculation 5 5  Recall 3 2  Language- name 2 objects 2 2  Language- repeat 1 1  Language- follow 3 step command 3 3  Language- read & follow direction 1 1  Write a sentence 1 1  Copy design 1 1  Total score 30 29     Orientation:  Alert and oriented to person, place and time. No aphasia or dysarthria. Fund of knowledge is appropriate. Recent memory impaired and remote memory intact.  Attention and concentration are normal.  Able to name objects and repeat phrases. Delayed recall  /5 Cranial nerves: There is good facial symmetry. Extraocular muscles are intact and visual fields are full to confrontational testing. Speech is fluent and clear. No tongue deviation. Hearing is intact to conversational tone. Tone: Tone is good throughout. Sensation: Sensation is intact to light touch. Vibration is intact at the bilateral big toe.  Coordination: The patient has no difficulty  with RAM's or FNF bilaterally. Normal finger to nose  Motor: Strength is 5/5 in the bilateral upper and lower extremities. There is no pronator drift. There are no fasciculations noted. DTR's: Deep tendon reflexes are 2/4 bilaterally. Gait and Station: The patient is able to ambulate without difficulty The patient is able to heel toe walk. Gait is cautious and narrow. The patient is able to ambulate in a tandem fashion.       Thank you for allowing us  the opportunity to participate in the care of this nice patient. Please do not hesitate to contact us  for any questions or concerns.   Total time spent on today's visit was 64 minutes dedicated to this patient today, preparing to see patient, examining the patient, ordering tests and/or medications and counseling the patient, documenting clinical information in the EHR or other health record, independently interpreting results and communicating results to the patient/family, discussing treatment and goals, answering patient's questions and coordinating care.  Cc:  Tye Gall, MD  Tex Filbert 03/17/2024 2:42 PM

## 2024-03-17 ENCOUNTER — Other Ambulatory Visit

## 2024-03-17 ENCOUNTER — Encounter

## 2024-03-17 ENCOUNTER — Ambulatory Visit (INDEPENDENT_AMBULATORY_CARE_PROVIDER_SITE_OTHER): Payer: Self-pay | Admitting: Physician Assistant

## 2024-03-17 ENCOUNTER — Encounter: Payer: Self-pay | Admitting: Physician Assistant

## 2024-03-17 VITALS — BP 118/57 | HR 62 | Resp 20 | Wt 156.0 lb

## 2024-03-17 DIAGNOSIS — R413 Other amnesia: Secondary | ICD-10-CM

## 2024-03-17 LAB — VITAMIN B12: Vitamin B-12: 300 pg/mL (ref 200–1100)

## 2024-03-17 NOTE — Patient Instructions (Addendum)
 It was a pleasure to see you today at our office.   Recommendations:  Neurocognitive evaluation at our office   MRI of the brain, the radiology office will call you to arrange you appointment  [id number] Check labs today B12  [address] Follow up in 4 months   Please bring the  copy of your test results  For psychiatric meds, mood meds: Please have your primary care physician manage these medications.  If you have any severe symptoms of a stroke, or other severe issues such as confusion,severe chills or fever, etc call 911 or go to the ER as you may need to be evaluated further For guidance regarding [facility] and if placement were needed at the facility, contact Social Worker tel: [phone]  For assessment of decision of mental capacity and competency:  Call Dr. Laverne Potter, geriatric psychiatrist at [phone] Counseling regarding caregiver distress, including caregiver depression, anxiety and issues regarding community resources, adult day care programs, adult living facilities, or memory care questions:  please contact your  Primary Doctor's Social Worker   FOR Memory  decline, memory medications: Call our office [phone]    [email or web address]   RECOMMENDATIONS FOR ALL PATIENTS WITH MEMORY PROBLEMS: 1. Continue to exercise (Recommend 30 minutes of walking everyday, or 3 hours every week) 2. Increase social interactions - continue going to [place] and enjoy social gatherings with friends and family 3. Eat healthy, avoid fried foods and eat more fruits and vegetables 4. Maintain adequate blood pressure, blood sugar, and blood cholesterol level. Reducing the risk of stroke and cardiovascular disease also helps promoting better memory. 5. Avoid stressful situations. Live a simple life and avoid aggravations. Organize your time and prepare for the next day in anticipation. 6. Sleep well, avoid any  interruptions of sleep and avoid any distractions in the bedroom that may interfere with adequate sleep quality 7. Avoid sugar, avoid sweets as there is a strong link between excessive sugar intake, diabetes, and cognitive impairment We discussed the Mediterranean diet, which has been shown to help patients reduce the risk of progressive memory disorders and reduces cardiovascular risk. This includes eating fish, eat fruits and green leafy vegetables, nuts like almonds and hazelnuts, walnuts, and also use olive oil. Avoid fast foods and fried foods as much as possible. Avoid sweets and sugar as sugar use has been linked to worsening of memory function.  There is always a concern of gradual progression of memory problems. If this is the case, then we may need to adjust level of care according to patient needs. Support, both to the patient and caregiver, should then be put into place.      You have been referred for a neuropsychological evaluation (i.e., evaluation of memory and thinking abilities). Please bring someone with you to this appointment if possible, as it is helpful for the doctor to hear from both you and another adult who knows you well. Please bring eyeglasses and hearing aids if you wear them.    The evaluation will take approximately 3 hours and has two parts:   The first part is a clinical interview with the neuropsychologist (Dr. Kitty Perkins or Dr. Donavon Fudge). During the interview, the neuropsychologist will speak with you and the individual you brought to the appointment.    The second part of the evaluation is testing with the doctor's technician Bernabe Brew or Burdette Carolin). During the testing, the technician will ask you to remember different types of material, solve problems, and answer some questionnaires. Your  family member will not be present for this portion of the evaluation.   Please note: We must reserve several hours of the neuropsychologist's time and the psychometrician's time for your  evaluation appointment. As such, there is a No-Show fee of $100. If you are unable to attend any of your appointments, please contact our office as soon as possible to reschedule.      DRIVING: Regarding driving, in patients with progressive memory problems, driving will be impaired. We advise to have someone else do the driving if trouble finding directions or if minor accidents are reported. Independent driving assessment is available to determine safety of driving.   If you are interested in the driving assessment, you can contact the following:  [facility] [employer] [facility] in [place] [phone]  [facility] [phone]  [facility] [phone]  [facility] [phone] or [phone]   FALL PRECAUTIONS: Be cautious when walking. Scan the area for obstacles that may increase the risk of trips and falls. When getting up in the mornings, sit up at the edge of the bed for a few minutes before getting out of bed. Consider elevating the bed at the head end to avoid drop of blood pressure when getting up. Walk always in a well-lit room (use night lights in the walls). Avoid area rugs or power cords from appliances in the middle of the walkways. Use a walker or a cane if necessary and consider physical therapy for balance exercise. Get your eyesight checked regularly.  FINANCIAL OVERSIGHT: Supervision, especially oversight when making financial decisions or transactions is also recommended.  HOME SAFETY: Consider the safety of the kitchen when operating appliances like stoves, microwave oven, and blender. Consider having supervision and share cooking responsibilities until no longer able to participate in those. Accidents with firearms and other hazards in the house should be identified and addressed as well.   ABILITY TO BE LEFT ALONE: If patient is unable to contact 911 operator, consider using [employer], or when the need is there, arrange for someone to  stay with patients. Smoking is a fire hazard, consider supervision or cessation. Risk of wandering should be assessed by caregiver and if detected at any point, supervision and safe proof recommendations should be instituted.  MEDICATION SUPERVISION: Inability to self-administer medication needs to be constantly addressed. Implement a mechanism to ensure safe administration of the medications.      Mediterranean Diet A Mediterranean diet refers to food and lifestyle choices that are based on the traditions of countries located on the [employer] [place]. This way of eating has been shown to help prevent certain conditions and improve outcomes for people who have chronic diseases, like kidney disease and heart disease. What are tips for following this plan? Lifestyle  Cook and eat meals together with your family, when possible. Drink enough fluid to keep your urine clear or pale yellow. Be physically active every day. This includes: Aerobic exercise like running or swimming. Leisure activities like gardening, walking, or housework. Get 7-8 hours of sleep each night. If recommended by your health care provider, drink red wine in moderation. This means 1 glass a day for nonpregnant women and 2 glasses a day for men. A glass of wine equals 5 oz (150 mL). Reading food labels  Check the serving size of packaged foods. For foods such as rice and pasta, the serving size refers to the amount of cooked product, not dry. Check the total fat in packaged foods. Avoid foods that have saturated fat or  trans fats. Check the ingredients list for added sugars, such as corn syrup. Shopping  At the grocery store, buy most of your food from the areas near the walls of the store. This includes: Fresh fruits and vegetables (produce). Grains, beans, nuts, and seeds. Some of these may be available in unpackaged forms or large amounts (in bulk). Fresh seafood. Poultry and eggs. Low-fat dairy products. Buy whole  ingredients instead of prepackaged foods. Buy fresh fruits and vegetables in-season from local farmers markets. Buy frozen fruits and vegetables in resealable bags. If you do not have access to quality fresh seafood, buy precooked frozen shrimp or canned fish, such as tuna, salmon, or sardines. Buy small amounts of raw or cooked vegetables, salads, or olives from the deli or salad bar at your store. Stock your pantry so you always have certain foods on hand, such as olive oil, canned tuna, canned tomatoes, rice, pasta, and beans. Cooking  Cook foods with extra-virgin olive oil instead of using butter or other vegetable oils. Have meat as a side dish, and have vegetables or grains as your main dish. This means having meat in small portions or adding small amounts of meat to foods like pasta or stew. Use beans or vegetables instead of meat in common dishes like chili or lasagna. Experiment with different cooking methods. Try roasting or broiling vegetables instead of steaming or sauteing them. Add frozen vegetables to soups, stews, pasta, or rice. Add nuts or seeds for added healthy fat at each meal. You can add these to yogurt, salads, or vegetable dishes. Marinate fish or vegetables using olive oil, lemon juice, garlic, and fresh herbs. Meal planning  Plan to eat 1 vegetarian meal one day each week. Try to work up to 2 vegetarian meals, if possible. Eat seafood 2 or more times a week. Have healthy snacks readily available, such as: Vegetable sticks with hummus. Greek yogurt. Fruit and nut trail mix. Eat balanced meals throughout the week. This includes: Fruit: 2-3 servings a day Vegetables: 4-5 servings a day Low-fat dairy: 2 servings a day Fish, poultry, or lean meat: 1 serving a day Beans and legumes: 2 or more servings a week Nuts and seeds: 1-2 servings a day Whole grains: 6-8 servings a day Extra-virgin olive oil: 3-4 servings a day Limit red meat and sweets to only a few servings  a month What are my food choices? Mediterranean diet Recommended Grains: Whole-grain pasta. Brown rice. Bulgar wheat. Polenta. Couscous. Whole-wheat bread. Dwyane Glad. Vegetables: Artichokes. Beets. Broccoli. Cabbage. Carrots. Eggplant. Green beans. Chard. Kale. Spinach. Onions. Leeks. Peas. Squash. Tomatoes. Peppers. Radishes. Fruits: Apples. Apricots. Avocado. Berries. Bananas. Cherries. Dates. Figs. Grapes. Lemons. Melon. Oranges. Peaches. Plums. Pomegranate. Meats and other protein foods: Beans. Almonds. Sunflower seeds. Pine nuts. Peanuts. Cod. Salmon. Scallops. Shrimp. Tuna. Tilapia. Clams. Oysters. Eggs. Dairy: Low-fat milk. Cheese. Greek yogurt. Beverages: Water . Red wine. Herbal tea. Fats and oils: Extra virgin olive oil. Avocado oil. Grape seed oil. Sweets and desserts: [place] yogurt with honey. Baked apples. Poached pears. Trail mix. Seasoning and other foods: Basil. Cilantro. Coriander. Cumin. Mint. Parsley. Sage. Rosemary. Tarragon. Garlic. Oregano. Thyme. Pepper. Balsalmic vinegar. Tahini. Hummus. Tomato sauce. Olives. Mushrooms. Limit these Grains: Prepackaged pasta or rice dishes. Prepackaged cereal with added sugar. Vegetables: Deep fried potatoes (french fries). Fruits: Fruit canned in syrup. Meats and other protein foods: Beef. Pork. Lamb. Poultry with skin. Hot dogs. Helene Loader. Dairy: Ice cream. Sour cream. Whole milk. Beverages: Juice. Sugar-sweetened soft drinks. Beer. Liquor and spirits. Fats  and oils: Butter. Canola oil. Vegetable oil. Beef fat (tallow). Lard. Sweets and desserts: Cookies. Cakes. Pies. Candy. Seasoning and other foods: Mayonnaise. Premade sauces and marinades. The items listed may not be a complete list. Talk with your dietitian about what dietary choices are right for you. Summary The Mediterranean diet includes both food and lifestyle choices. Eat a variety of fresh fruits and vegetables, beans, nuts, seeds, and whole grains. Limit the amount of  red meat and sweets that you eat. Talk with your health care provider about whether it is safe for you to drink red wine in moderation. This means 1 glass a day for nonpregnant women and 2 glasses a day for men. A glass of wine equals 5 oz (150 mL). This information is not intended to replace advice given to you by your health care provider. Make sure you discuss any questions you have with your health care provider. Document Released: 06/08/2016 Document Revised: 07/11/2016 Document Reviewed: 06/08/2016 Elsevier Interactive Patient Education  2017 [employer].

## 2024-03-18 ENCOUNTER — Ambulatory Visit: Payer: Self-pay | Admitting: Physician Assistant

## 2024-03-18 NOTE — Telephone Encounter (Signed)
 Pt. Dropped off Results placed inbox

## 2024-03-31 ENCOUNTER — Ambulatory Visit: Admission: RE | Admit: 2024-03-31 | Source: Ambulatory Visit

## 2024-04-01 DIAGNOSIS — L501 Idiopathic urticaria: Secondary | ICD-10-CM | POA: Diagnosis not present

## 2024-04-07 NOTE — Progress Notes (Signed)
 BH MD/PA/NP OP Progress Note  04/10/2024 2:58 PM SWEDEN LESURE  MRN:  [id number]  Visit Diagnosis:    ICD-10-CM   1. GAD (generalized anxiety disorder)  F41.1 FLUoxetine  (PROZAC ) 20 MG capsule    2. Recurrent major depressive disorder, in remission [facility])  F33.40       Assessment: [name] is a 68 y.o. female with a history of MDD, GAD, fibromyalgia, Hypothyroidism who presented to [facility] at [facility] for initial evaluation on 03/04/2024.    At initial evaluation patient reported symptoms of anxiety including excessive worry, fears something awful happening, difficulty relaxing, increased irritability, and feelings of being on edge.  Symptoms are not constant and occur secondary to triggers.  That said they are significantly impacting her day-to-day life.  Patient has a past history of depression/chronic fatigue syndrome.  Symptoms have been well managed with medication and she denies any significant depressive symptoms at initial evaluation.  The patient does have a past trauma history she denies symptoms consistent with PTSD diagnosis.  Patient met criteria for GAD and MDD in remission.  [name] presents for follow-up evaluation. Today, 04/10/24, patient reports some improvement in anxiety in the interim.  This could be related to the medication or limiting the outside triggers causing the anxiety.  Patient is tolerated the transition from Celexa  to Prozac  well denying any adverse side effects.  There was some question of 3 pound weight gain which we will continue to monitor.  Patient will follow up in 2 months.  Risk Assessment: An assessment of suicide and violence risk factors was performed as part of this evaluation and is not significantly changed from the last visit. While future psychiatric events cannot be accurately predicted, the patient does not currently require acute inpatient psychiatric care and does not currently meet [place]  involuntary  commitment criteria. Patient was given contact information for crisis resources, behavioral health clinic and was instructed to call 911 for emergencies.   Plan: # GAD/MDD Past medication trials: Duloxetine  (lack of benefit), Prozac  (sexual side effects), Paxil, Effexor  (headaches, nausea, constipation,dizziness), Celexa  (hallucinations, dizziness) Xanax  (sedation) Status of problem: Ongoing Interventions: - Continue Bupropion  XL 450 mg daily (initially prescribed for chronic fatigue syndrome) - Continue Prozac  20 mg daily - Continue Abilify  2 mg daily - CMP, CBC, Lipid panel, A1c reviewed  Chief Complaint:  Chief Complaint  Patient presents with   Follow-up   HPI: [name] presents initially reporting that there has been no notable change this past month.  She completed the taper off of Celexa  and started the Prozac  and seems to be tolerating well.  That said on further exploration patient reports that her anxiety has decreased and she is not feeling as completely helpless or stranded as she had in the past.  This could be in part due to the medication change but also could be due to her changing some of her prior behaviors.  Now patient and her partner only watch the news from 6-7 at night instead of all evening.  By removing herself from the perpetual negative stimulus of the news [name] noticed that her mind is not as focused on that helplessness.  The only concern she raised today was there has been about a 3 pound weight gain recently.  Patient does tractor regularly and says she was 153 on her scale this morning.  We will continue to monitor this as her last 2 office readings were 155 though that was with clothes on.  Of the medication she is taking both Prozac  and Abilify  have risk for weight gain with Abilify  being the more likely offender.  That said she has been on Abilify  for a number of years without this weight gain which would be atypical.  We will continue to monitor and consider  alternatives to Prozac  in the future if needed.  Discussed whether we should titrate dose today however given patient's gradual improvement and Prozac 's time to reach a steady state in the body we opted to continue to monitor and reevaluate at next visit.  Past Psychiatric History:  Past psychiatric diagnoses: MDD and GAD Psychiatric hospitalizations:No hospitalizations Past suicide attempts: Denies Hx of self harm: Denies Hx of violence towards others: None Prior psychiatric providers: No prior psychiatric providers Prior therapy: Had done it around 40 years ago, after Access to firearms: No  Prior medication trials: Paxil, Prozac  (the sexual dysfunction), Effexor  (he headaches, nausea, constipation), Celexa  (hallucinations), Ambien (sleepwalking, vivid dreams)   Substance use: Alcohol a glass a month. Used marijuana college a couple times in college. Denies any other substance use hx,    Past Medical History:  Past Medical History:  Diagnosis Date   Asthma    Back pain    Chronic fatigue syndrome    Dr. Annabell Key   Complication of anesthesia    Cough    /SOB, PFT's nl, improved with Bronchodilation 8/11   Dysrhythmia    SVT   Endometrial cancer (HCC)    Esophageal spasm    GERD Dr. Annabell Key, Dr. Lincoln Renshaw; egd 2006 nl; UGI 3/13 Small HH, moderate GERD, nl motility; seen at [facility] egd? neg, sone improvement on sucralfate susp   Fibromyalgia    Foot pain    Dr. Assunta Lax   Hashimoto's thyroiditis    High triglycerides    Hypothyroidism    Dr. Darra Elliot 4/14   Leukocytoclastic vasculitis (HCC)    Dr. Alanda Allegra   Low HDL (under 40)    Mitral valve prolapse    Paroxysmal atrial tachycardia (HCC)    Dr. Renna Cary   Patent foramen ovale    Pernicious anemia    Dr. Annabell Key   PONV (postoperative nausea and vomiting)    along time ago had nausea ans vomiting after surgery-25 years ago   Reflux    ? delayed gastric emptying--GES nl 01/2012 (6% at 2hrs)   RLS (restless legs  syndrome)    low ferritin Dr. Annabell Key   Rosacea    Sleep apnea    Uterine carcinoma (HCC)    Dr. Monroe Antigua    Past Surgical History:  Procedure Laterality Date   ABDOMINAL EXPOSURE N/A 12/07/2021   Procedure: ABDOMINAL EXPOSURE;  Surgeon: Young Hensen, MD;  Location: [facility] OR;  Service: Vascular;  Laterality: N/A;   ABDOMINAL HYSTERECTOMY     bladder tack     x 2   BUNIONECTOMY     CALCANEAL OSTEOTOMY Right 01/04/2023   Procedure: CALCANEAL OSTEOTOMY;  Surgeon: Amada Backer, MD;  Location: [facility];  Service: Orthopedics;  Laterality: Right;   CHOLECYSTECTOMY  2009   COLONOSCOPY     scr, 12/2008 ([place]) nl   COLOSTOMY Left 06/05/2018   Procedure: COLOSTOMY;  Surgeon: Shela Derby, MD;  Location: [facility] OR;  Service: General;  Laterality: Left;   COLOSTOMY REVERSAL N/A 10/16/2018   Procedure: HARTMANN'S COLOSTOMY REVERSAL, RIGID PROCTOSCOPY;  Surgeon: Shela Derby, MD;  Location: [facility] ORS;  Service: General;  Laterality: N/A;   GASTROCNEMIUS RECESSION Right 01/04/2023   Procedure:  GASTROCNEMIUS recession;  Surgeon: Amada Backer, MD;  Location: [facility];  Service: Orthopedics;  Laterality: Right;   HAMMER TOE SURGERY     HYSTERECTOMY ABDOMINAL WITH SALPINGO-OOPHORECTOMY  2008   INCISIONAL HERNIA REPAIR  04/09/2019   x2   INCISIONAL HERNIA REPAIR N/A 04/09/2019   Procedure: INCISIONAL HERNIA REPAIR WITH MESH;  Surgeon: Shela Derby, MD;  Location: [facility] OR;  Service: General;  Laterality: N/A;   LAPAROSCOPY N/A 10/16/2018   Procedure: LAPAROSCOPY DIAGNOSTIC WITH LYSIS OF ADHESIONS;  Surgeon: Shela Derby, MD;  Location: [facility] ORS;  Service: General;  Laterality: N/A;   LUNG BIOPSY     L Lower lung pulm nodules, largest 4.73mm, low risk, repeat CT in 1 yr now following with pulm at [facility]; 9/13 Stable tiny lung nodules, no f/u needed   LYMPH NODE DISSECTION  2008   LYSIS OF ADHESION N/A 04/09/2019   Procedure: OPEN LYSIS OF ADHESION;  Surgeon: Shela Derby, MD;  Location: [facility] OR;  Service: General;  Laterality: N/A;   METATARSAL OSTEOTOMY     NECK SURGERY     x 2   OBLIQUE LUMBAR INTERBODY FUSION 1 LEVEL WITH PERCUTANEOUS SCREWS N/A 12/07/2021   Procedure: OBLIQUE LUMBAR INTERBODY FUSION 1 LEVEL WITH PERCUTANEOUS SCREWS (OLIF L4-5 WITH POSTERIOR SPINAL FUSION AND PEDICLE SCREWS);  Surgeon: Mort Ards, MD;  Location: [facility] OR;  Service: Orthopedics;  Laterality: N/A;  4 HRS DR. CLARK TO DO APPROACH LEFT TAP BLOCK WITH EXPAREL  3 C-BED   PARTIAL COLECTOMY N/A 06/05/2018   Procedure: EXPLORATORY LAPAROTOMY AND PARTIAL COLECTOMY;  Surgeon: Shela Derby, MD;  Location: [facility] OR;  Service: General;  Laterality: N/A;   POSTERIOR TIBIAL TENDON REPAIR Right 01/04/2023   Procedure: POSTERIOR TIBIAL TENDON TENOLYSIS AND  flexor digitorum longus TRANSFER TO NAVICULAR;  Surgeon: Amada Backer, MD;  Location: [facility];  Service: Orthopedics;  Laterality: Right;   TONSILLECTOMY     US  ECHOCARDIOGRAPHY     with Nuclear test, Dr. Renna Cary, Low risk   VESICOVAGINAL FISTULA CLOSURE W/ TAH     Family History:  Family History  Problem Relation Age of Onset   Hashimoto's thyroiditis Mother    High blood pressure Mother    Stroke Mother    Other Mother        Borderline DM   Alzheimer's disease Mother    Ulcerative colitis Father    Meniere's disease Father    Hearing loss Father    Stroke Father    Stroke Sister     Social History:  Social History   Socioeconomic History   Marital status: Married    Spouse name: Not on file   Number of children: 2   Years of education: 13   Highest education level: Not on file  Occupational History   Not on file  Tobacco Use   Smoking status: Never   Smokeless tobacco: Never  Vaping Use   Vaping status: Never Used  Substance and Sexual Activity   Alcohol use: Yes    Comment: maybe a glass of wine/month   Drug use: Never   Sexual activity: Not on file  Other Topics Concern   Not on  file  Social History Narrative   Diet: High calorie/ high fat      Caffeine: Yes      Married, if yes what year: Married, 1986      Do you live in a house, apartment, assisted living, [place], trailer, ect: [employer]  Is it one or more stories: One story      How many persons live in your home? 2      Pets: 2      Highest level or education completed: completed college, some graduate school      Current/Past profession: Retail-Division management      Exercise: Rarely                 Type and how often: Not often- experience PEM         Living Will:    DNR:   POA/HPOA:      Functional Status:   Do you have difficulty bathing or dressing yourself? No   Do you have difficulty preparing food or eating?No   Do you have difficulty managing your medications?No   Do you have difficulty managing your finances?No   Do you have difficulty affording your medications?No   Drinks caffeine coke products every other day   One floor home   Right handed   Social Drivers of Health   Financial Resource Strain: Not on file  Food Insecurity: No Food Insecurity (06/18/2023)   Hunger Vital Sign    Worried About Running Out of Food in the Last Year: Never true    Ran Out of Food in the Last Year: Never true  Transportation Needs: No Transportation Needs (06/18/2023)   PRAPARE - Administrator, Civil Service (Medical): No    Lack of Transportation (Non-Medical): No  Physical Activity: Not on file  Stress: Not on file  Social Connections: Not on file    Allergies:  Allergies  Allergen Reactions   Asa [Aspirin ] Anaphylaxis    325mg  or higher Pt ok with 81mg  daily dose   Bee Venom Shortness Of Breath, Itching, Swelling and Hives   Peanut-Containing Drug Products Anaphylaxis and Other (See Comments)   Ambien [Zolpidem Tartrate] Other (See Comments)    Hallucinations   Tape Itching, Swelling and Rash   Cipro  [Ciprofloxacin  Hcl] Nausea And Vomiting   Latex Rash   Minocycline  Rash   Shellfish Allergy Rash    Current Medications: Current Outpatient Medications  Medication Sig Dispense Refill   acetaminophen  (TYLENOL ) 500 MG tablet Take 2 tablets (1,000 mg total) by mouth every 6 (six) hours as needed. 30 tablet 0   albuterol  (PROVENTIL  HFA;VENTOLIN  HFA) 108 (90 Base) MCG/ACT inhaler Inhale 2 puffs into the lungs every 4 (four) hours as needed for wheezing or shortness of breath.     ARIPiprazole  (ABILIFY ) 2 MG tablet TAKE 1 TABLET BY MOUTH DAILY 30 tablet 5   Ascorbic Acid (VITAMIN C PO) Take 1 tablet by mouth daily.     aspirin  EC 81 MG tablet Take 81 mg by mouth at bedtime.     atorvastatin  (LIPITOR) 10 MG tablet TAKE 1 TABLET BY MOUTH DAILY 90 tablet 2   buPROPion  (WELLBUTRIN  XL) 150 MG 24 hr tablet TAKE 3 TABLETS BY MOUTH DAILY 270 tablet 1   Cholecalciferol  (VITAMIN D -3 PO) Take 1 capsule by mouth daily.     cyanocobalamin  (VITAMIN B12) 1000 MCG/ML injection Inject 1 mL (1,000 mcg total) into the muscle every 30 (thirty) days. 1 mL 3   docusate sodium  (COLACE) 100 MG capsule Take 1 capsule (100 mg total) by mouth 2 (two) times daily. While taking narcotic pain medicine. 30 capsule 0   EPINEPHrine  0.3 mg/0.3 mL IJ SOAJ injection Inject 0.3 mg into the muscle as needed for anaphylaxis.     FLUoxetine  (  PROZAC ) 20 MG capsule Take 1 capsule (20 mg total) by mouth daily for 7 days. 90 capsule 0   ipratropium (ATROVENT ) 0.06 % nasal spray Place 2 sprays into both nostrils 2 (two) times daily as needed for rhinitis.     ketotifen  (ZADITOR ) 0.025 % ophthalmic solution Place 1 drop into both eyes daily.     levocetirizine (XYZAL) 5 MG tablet SMARTSIG:1 Tablet(s) By Mouth Every Evening     levothyroxine  (SYNTHROID ) 150 MCG tablet Take 150 mcg by mouth daily.     metoprolol  succinate (TOPROL -XL) 25 MG 24 hr tablet Take 1 tablet (25 mg total) by mouth daily. 90 tablet 3   naproxen sodium (ALEVE) 220 MG tablet Take 220 mg by mouth daily as needed (general pain, headache).      nitroGLYCERIN  (NITROSTAT ) 0.4 MG SL tablet Place 1 tablet (0.4 mg total) under the tongue every 5 (five) minutes as needed for chest pain. 25 tablet 3   omalizumab  (XOLAIR ) 150 MG injection 300 mg every 30 (thirty) days. Every 29th or 30th     pantoprazole  (PROTONIX ) 40 MG tablet Take 40 mg by mouth 2 (two) times daily.     polyethylene glycol (MIRALAX  / GLYCOLAX ) 17 g packet Take 17 g by mouth daily as needed (constipation).     pramipexole  (MIRAPEX ) 0.125 MG tablet Take 3 tablets (0.375 mg total) by mouth at bedtime as needed (restless legs syndrome). 270 tablet 1   Probiotic Product (PROBIOTIC PO) Take 1 capsule by mouth daily.     TRELEGY ELLIPTA 200-62.5-25 MCG/ACT AEPB Inhale 1 puff into the lungs at bedtime.     No current facility-administered medications for this visit.     Psychiatric Specialty Exam: There were no vitals taken for this visit.There is no height or weight on file to calculate BMI. Review of Systems  General Appearance: Well Groomed  Eye Contact:  Good  Speech:  Clear and Coherent  Volume:  Normal  Mood:  mild anxiety  Affect:  Appropriate  Thought Content: Logical   Suicidal Thoughts:  No  Homicidal Thoughts:  No  Thought Process:  Coherent and Goal Directed  Orientation:  Full (Time, Place, and Person)    Memory: Immediate;   Good  Judgment:  Good  Insight:  Fair  Concentration:  Concentration: Good  Recall:  not formally assessed   Fund of Knowledge: Good  Language: Good  Psychomotor Activity:  Normal  Akathisia:  No  AIMS (if indicated): not done  Assets:  Communication Skills Desire for Improvement Financial Resources/Insurance Housing  ADL's:  Intact  Cognition: WNL  Sleep:  Good   Metabolic Disorder Labs: Lab Results  Component Value Date   HGBA1C 5.0 06/13/2023   MPG 96.8 06/13/2023   MPG 134 08/16/2022   No results found for: PROLACTIN Lab Results  Component Value Date   CHOL 134 09/06/2023   TRIG 148 09/06/2023   HDL 38  (L) 09/06/2023   CHOLHDL 3.5 09/06/2023   VLDL 46 (H) 05/23/2016   LDLCALC 70 09/06/2023   LDLCALC 40 05/23/2016   Lab Results  Component Value Date   TSH 1.12 11/28/2023    Therapeutic Level Labs: No results found for: LITHIUM No results found for: VALPROATE No results found for: CBMZ   Screenings: GAD-7    Flowsheet Row [facility]20[facility] Video Visit from 12/26/2023 in [facility] Office Visit from 11/28/2023 in [facility]  Total GAD-7 Score 9 16 19    Mini-Mental    Flowsheet Row Office Visit from 12/17/2023 in [facility] Office Visit from 07/22/2019 in [facility]  Total Score (max 30 points ) 30 29   PHQ2-9    Flowsheet Row Office Visit from 03/04/2024 in [facility] Video Visit from 01/16/2024 in [facility] Video Visit from 12/26/2023 in [facility] Office Visit from 12/17/2023 in [facility] Office Visit from 11/28/2023 in [facility]  PHQ-2 Total Score 1 3 2  0 5  PHQ-9 Total Score -- 8 6 -- 12   Flowsheet Row Office Visit from 03/04/2024 in [facility] Most recent reading at 03/04/2024  1:55 PM ED from 09/28/2023 in [facility] at [facility] Most recent reading at 09/28/2023  8:32 PM UC from 09/28/2023 in [facility] Most recent reading at 09/28/2023  7:56 PM  C-SSRS RISK CATEGORY No Risk No Risk No Risk    Collaboration of Care: Collaboration of Care: Medication Management AEB medication prescription and Other provider involved in patient's care AEB neurology chart  review  Patient/Guardian was advised Release of Information must be obtained prior to any record release in order to collaborate their care with an outside provider. Patient/Guardian was advised if they have not already done so to contact the registration department to sign all necessary forms in order for us  to release information regarding their care.   Consent: Patient/Guardian gives verbal consent for treatment and assignment of benefits for services provided during this visit. Patient/Guardian expressed understanding and agreed to proceed.    Yves Herb, MD 04/10/2024, 2:59 PM   Virtual Visit via Video Note  I connected with Teresia Fennel on 04/10/24 at  2:30 PM EDT by a video enabled telemedicine application and verified that I am speaking with the correct person using two identifiers.  Location: Patient: Home Provider: Home Office   I discussed the limitations of evaluation and management by telemedicine and the availability of in person appointments. The patient expressed understanding and agreed to proceed.   I discussed the assessment and treatment plan with the patient. The patient was provided an opportunity to ask questions and all were answered. The patient agreed with the plan and demonstrated an understanding of the instructions.   The patient was advised to call back or seek an in-person evaluation if the symptoms worsen or if the condition fails to improve as anticipated.  I provided 20 minutes of non-face-to-face time during this encounter.   Yves Herb, MD

## 2024-04-09 DIAGNOSIS — G4733 Obstructive sleep apnea (adult) (pediatric): Secondary | ICD-10-CM | POA: Diagnosis not present

## 2024-04-10 ENCOUNTER — Telehealth (HOSPITAL_COMMUNITY): Admitting: Psychiatry

## 2024-04-10 ENCOUNTER — Encounter (HOSPITAL_COMMUNITY): Payer: Self-pay | Admitting: Psychiatry

## 2024-04-10 DIAGNOSIS — F3340 Major depressive disorder, recurrent, in remission, unspecified: Secondary | ICD-10-CM

## 2024-04-10 DIAGNOSIS — F411 Generalized anxiety disorder: Secondary | ICD-10-CM

## 2024-04-10 MED ORDER — FLUOXETINE HCL 20 MG PO CAPS: 20.0000 mg | ORAL_CAPSULE | Freq: Every day | ORAL | 0 refills | Status: DC

## 2024-04-11 ENCOUNTER — Ambulatory Visit
Admission: RE | Admit: 2024-04-11 | Discharge: 2024-04-11 | Disposition: A | Discharge status: Home / Self Care | Source: Ambulatory Visit | Attending: Physician Assistant | Admitting: Physician Assistant

## 2024-04-11 DIAGNOSIS — R413 Other amnesia: Secondary | ICD-10-CM | POA: Diagnosis not present

## 2024-04-11 MED ORDER — GADOPICLENOL 0.5 MMOL/ML IV SOLN
7.0000 mL | Freq: Once | INTRAVENOUS | Status: AC | PRN
  Administered 2024-04-11: 7 mL via INTRAVENOUS

## 2024-04-23 DIAGNOSIS — Z01419 Encounter for gynecological examination (general) (routine) without abnormal findings: Secondary | ICD-10-CM | POA: Diagnosis not present

## 2024-04-23 DIAGNOSIS — N952 Postmenopausal atrophic vaginitis: Secondary | ICD-10-CM | POA: Diagnosis not present

## 2024-04-23 DIAGNOSIS — R3915 Urgency of urination: Secondary | ICD-10-CM | POA: Diagnosis not present

## 2024-05-01 DIAGNOSIS — L501 Idiopathic urticaria: Secondary | ICD-10-CM | POA: Diagnosis not present

## 2024-05-06 ENCOUNTER — Institutional Professional Consult (permissible substitution): Admitting: Psychology

## 2024-05-06 ENCOUNTER — Ambulatory Visit: Payer: Self-pay

## 2024-05-14 ENCOUNTER — Encounter: Admitting: Psychology

## 2024-05-21 DIAGNOSIS — Z1231 Encounter for screening mammogram for malignant neoplasm of breast: Secondary | ICD-10-CM | POA: Diagnosis not present

## 2024-05-30 DIAGNOSIS — L501 Idiopathic urticaria: Secondary | ICD-10-CM | POA: Diagnosis not present

## 2024-06-09 NOTE — Progress Notes (Signed)
 BH MD/PA/NP OP Progress Note  06/12/2024 2:31 PM [name]  MRN:  [id number]  Visit Diagnosis:    ICD-10-CM   1. GAD (generalized anxiety disorder)  F41.1     2. Recurrent major depressive disorder, in remission [facility])  F33.40      Assessment: [name] is a 68 y.o. female with a history of MDD, GAD, fibromyalgia, Hypothyroidism who presented to [facility] at [facility] for initial evaluation on 03/04/2024.    At initial evaluation patient reported symptoms of anxiety including excessive worry, fears something awful happening, difficulty relaxing, increased irritability, and feelings of being on edge.  Symptoms are not constant and occur secondary to triggers.  That said they are significantly impacting her day-to-day life.  Patient has a past history of depression/chronic fatigue syndrome.  Symptoms have been well managed with medication and she denies any significant depressive symptoms at initial evaluation.  The patient does have a past trauma history she denies symptoms consistent with PTSD diagnosis.  Patient met criteria for GAD and MDD in remission.  [name] presents for follow-up evaluation. Today, 06/12/24, patient reports that her mood and anxiety have been stable in the interim.  There are several ongoing psychosocial stressors patient has set up boundaries to control her exposure to these.  By doing so the anxiety has not worsened.  She is taking her medication consistently denying any adverse side effects.  We will continue on her current regimen and follow up in 3 months.  Patient due for repeat metabolic blood work at that time.  Risk Assessment: An assessment of suicide and violence risk factors was performed as part of this evaluation and is not significantly changed from the last visit. While future psychiatric events cannot be accurately predicted, the patient does not currently require acute inpatient psychiatric care and does not currently meet  [place]  involuntary commitment criteria. Patient was given contact information for crisis resources, behavioral health clinic and was instructed to call 911 for emergencies.   Plan: # GAD/MDD Past medication trials: Duloxetine  (lack of benefit), Prozac  (sexual side effects), Paxil, Effexor  (headaches, nausea, constipation,dizziness), Celexa  (hallucinations, dizziness) Xanax  (sedation) Status of problem: Ongoing Interventions: - Continue Bupropion  XL 450 mg daily (initially prescribed for chronic fatigue syndrome) - Continue Prozac  20 mg daily - Continue Abilify  2 mg daily - CMP, CBC, Lipid panel, A1c reviewed  - Repeat in November of 2024  Chief Complaint:  Chief Complaint  Patient presents with   Follow-up   HPI: [name] presents reporting that she has been doing fairly well over the last few months.  Despite numerous ongoing wrote events she has done well as long as she maintains her barriers.  Patient reports that by minimally watching the news and avoiding talking or getting involved in too many politics has substantially lowered her anxiety levels.  When she does stay in this bubble without those things she is upbeat and optimistic about the future.  She has been enjoying a stitching group, spending time with her friends, and going to church.  When topics do start to straighten more toward politics though or she watches too much the news the anxiety symptoms can creeping and become more substantial.  Patient continues to take her medications consistently denying any adverse side effects.  The weight concerns mentioned previously have remained stable in the interim.  She has stayed in the 150 to 153 pound range without any further weight gain.  Given improvement patient would like to  continue on her current regimen. Past Psychiatric History:  Past psychiatric diagnoses: MDD and GAD Psychiatric hospitalizations:No hospitalizations Past suicide attempts: Denies Hx of self harm:  Denies Hx of violence towards others: None Prior psychiatric providers: No prior psychiatric providers Prior therapy: Had done it around 40 years ago, after Access to firearms: No  Prior medication trials: Paxil, Prozac  (the sexual dysfunction), Effexor  (he headaches, nausea, constipation), Celexa  (hallucinations), Ambien (sleepwalking, vivid dreams)   Substance use: Alcohol a glass a month. Used marijuana college a couple times in college. Denies any other substance use hx,    Past Medical History:  Past Medical History:  Diagnosis Date   Asthma    Back pain    Chronic fatigue syndrome    Dr. Cleotilde   Complication of anesthesia    Cough    /SOB, PFT's nl, improved with Bronchodilation 8/11   Dysrhythmia    SVT   Endometrial cancer (HCC)    Esophageal spasm    GERD Dr. Cleotilde, Dr. Vicci; egd 2006 nl; UGI 3/13 Small HH, moderate GERD, nl motility; seen at [facility] [place] egd? neg, sone improvement on sucralfate susp   Fibromyalgia    Foot pain    Dr. Terrie   Hashimoto's thyroiditis    High triglycerides    Hypothyroidism    Dr. Melodee 4/14   Leukocytoclastic vasculitis (HCC)    Dr. Helga   Low HDL (under 40)    Mitral valve prolapse    Paroxysmal atrial tachycardia (HCC)    Dr. Jeffrie   Patent foramen ovale    Pernicious anemia    Dr. Cleotilde   PONV (postoperative nausea and vomiting)    along time ago had nausea ans vomiting after surgery-25 years ago   Reflux    ? delayed gastric emptying--GES nl 01/2012 (6% at 2hrs)   RLS (restless legs syndrome)    low ferritin Dr. Cleotilde   Rosacea    Sleep apnea    Uterine carcinoma (HCC)    Dr. Arlee    Past Surgical History:  Procedure Laterality Date   ABDOMINAL EXPOSURE N/A 12/07/2021   Procedure: ABDOMINAL EXPOSURE;  Surgeon: Gretta Lonni PARAS, MD;  Location: [facility] OR;  Service: Vascular;  Laterality: N/A;   ABDOMINAL HYSTERECTOMY     bladder tack     x 2   BUNIONECTOMY     CALCANEAL OSTEOTOMY Right  01/04/2023   Procedure: CALCANEAL OSTEOTOMY;  Surgeon: Kit Rush, MD;  Location: [facility];  Service: Orthopedics;  Laterality: Right;   CHOLECYSTECTOMY  2009   COLONOSCOPY     scr, 12/2008 ([facility]) nl   COLOSTOMY Left 06/05/2018   Procedure: COLOSTOMY;  Surgeon: Rubin Calamity, MD;  Location: [facility] OR;  Service: General;  Laterality: Left;   COLOSTOMY REVERSAL N/A 10/16/2018   Procedure: HARTMANN'S COLOSTOMY REVERSAL, RIGID PROCTOSCOPY;  Surgeon: Rubin Calamity, MD;  Location: [facility]S;  Service: General;  Laterality: N/A;   GASTROCNEMIUS RECESSION Right 01/04/2023   Procedure: GASTROCNEMIUS recession;  Surgeon: Kit Rush, MD;  Location: [facility];  Service: Orthopedics;  Laterality: Right;   HAMMER TOE SURGERY     HYSTERECTOMY ABDOMINAL WITH SALPINGO-OOPHORECTOMY  2008   INCISIONAL HERNIA REPAIR  04/09/2019   x2   INCISIONAL HERNIA REPAIR N/A 04/09/2019   Procedure: INCISIONAL HERNIA REPAIR WITH MESH;  Surgeon: Rubin Calamity, MD;  Location: [facility] OR;  Service: General;  Laterality: N/A;   LAPAROSCOPY N/A 10/16/2018   Procedure: LAPAROSCOPY DIAGNOSTIC WITH LYSIS OF ADHESIONS;  Surgeon:  Rubin Calamity, MD;  Location: [facility];  Service: General;  Laterality: N/A;   LUNG BIOPSY     L Lower lung pulm nodules, largest 4.33mm, low risk, repeat CT in 1 yr now following with pulm at [facility]; 9/13 Stable tiny lung nodules, no f/u needed   LYMPH NODE DISSECTION  2008   LYSIS OF ADHESION N/A 04/09/2019   Procedure: OPEN LYSIS OF ADHESION;  Surgeon: Rubin Calamity, MD;  Location: [facility] OR;  Service: General;  Laterality: N/A;   METATARSAL OSTEOTOMY     NECK SURGERY     x 2   OBLIQUE LUMBAR INTERBODY FUSION 1 LEVEL WITH PERCUTANEOUS SCREWS N/A 12/07/2021   Procedure: OBLIQUE LUMBAR INTERBODY FUSION 1 LEVEL WITH PERCUTANEOUS SCREWS (OLIF L4-5 WITH POSTERIOR SPINAL FUSION AND PEDICLE SCREWS);  Surgeon: Burnetta Aures, MD;  Location: [facility] OR;  Service: Orthopedics;  Laterality:  N/A;  4 HRS DR. CLARK TO DO APPROACH LEFT TAP BLOCK WITH EXPAREL  3 C-BED   PARTIAL COLECTOMY N/A 06/05/2018   Procedure: EXPLORATORY LAPAROTOMY AND PARTIAL COLECTOMY;  Surgeon: Rubin Calamity, MD;  Location[facility] OR;  Service: General;  Laterality: N/A;   POSTERIOR TIBIAL TENDON REPAIR Right 01/04/2023   Procedure: POSTERIOR TIBIAL TENDON TENOLYSIS AND  flexor digitorum longus TRANSFER TO NAVICULAR;  Surgeon: Kit Rush, MD;  Location: [facility];  Service: Orthopedics;  Laterality: Right;   TONSILLECTOMY     US  ECHOCARDIOGRAPHY     with Nuclear test, Dr. Jeffrie, Low risk   VESICOVAGINAL FISTULA CLOSURE W/ TAH     Family History:  Family History  Problem Relation Age of Onset   Hashimoto's thyroiditis Mother    High blood pressure Mother    Stroke Mother    Other Mother        Borderline DM   Alzheimer's disease Mother    Ulcerative colitis Father    Meniere's disease Father    Hearing loss Father    Stroke Father    Stroke Sister     Social History:  Social History   Socioeconomic History   Marital status: Married    Spouse name: Not on file   Number of children: 2   Years of education: 13   Highest education level: Not on file  Occupational History   Not on file  Tobacco Use   Smoking status: Never   Smokeless tobacco: Never  Vaping Use   Vaping status: Never Used  Substance and Sexual Activity   Alcohol use: Yes    Comment: maybe a glass of wine/month   Drug use: Never   Sexual activity: Not on file  Other Topics Concern   Not on file  Social History Narrative   Diet: High calorie/ high fat      Caffeine: Yes      Married, if yes what year: Married, 1986      Do you live in a house, apartment, assisted living, condo, trailer, ect: House      Is it one or more stories: One story      How many persons live in your home? 2      Pets: 2      Highest level or education completed: completed college, some graduate school       Current/Past profession: Retail-Division management      Exercise: Rarely                 Type and how often: Not often- experience PEM  Living Will:    DNR:   POA/HPOA:      Functional Status:   Do you have difficulty bathing or dressing yourself? No   Do you have difficulty preparing food or eating?No   Do you have difficulty managing your medications?No   Do you have difficulty managing your finances?No   Do you have difficulty affording your medications?No   Drinks caffeine coke products every other day   One floor home   Right handed   Social Drivers of Health   Financial Resource Strain: Not on file  Food Insecurity: No Food Insecurity (06/18/2023)   Hunger Vital Sign    Worried About Running Out of Food in the Last Year: Never true    Ran Out of Food in the Last Year: Never true  Transportation Needs: No Transportation Needs (06/18/2023)   PRAPARE - Administrator, Civil Service (Medical): No    Lack of Transportation (Non-Medical): No  Physical Activity: Not on file  Stress: Not on file  Social Connections: Not on file    Allergies:  Allergies  Allergen Reactions   Dorethia Mare ] Anaphylaxis    325mg  or higher Pt ok with 81mg  daily dose   Bee Venom Shortness Of Breath, Itching, Swelling and Hives   Peanut-Containing Drug Products Anaphylaxis and Other (See Comments)   Ambien [Zolpidem Tartrate] Other (See Comments)    Hallucinations   Tape Itching, Swelling and Rash   Cipro  [Ciprofloxacin  Hcl] Nausea And Vomiting   Latex Rash   Minocycline Rash   Shellfish Allergy Rash    Current Medications: Current Outpatient Medications  Medication Sig Dispense Refill   acetaminophen  (TYLENOL ) 500 MG tablet Take 2 tablets (1,000 mg total) by mouth every 6 (six) hours as needed. 30 tablet 0   albuterol  (PROVENTIL  HFA;VENTOLIN  HFA) 108 (90 Base) MCG/ACT inhaler Inhale 2 puffs into the lungs every 4 (four) hours as needed for wheezing or shortness of  breath.     ARIPiprazole  (ABILIFY ) 2 MG tablet TAKE 1 TABLET BY MOUTH DAILY 30 tablet 5   Ascorbic Acid (VITAMIN C PO) Take 1 tablet by mouth daily.     aspirin  EC 81 MG tablet Take 81 mg by mouth at bedtime.     atorvastatin  (LIPITOR) 10 MG tablet TAKE 1 TABLET BY MOUTH DAILY 90 tablet 2   buPROPion  (WELLBUTRIN  XL) 150 MG 24 hr tablet TAKE 3 TABLETS BY MOUTH DAILY 270 tablet 1   Cholecalciferol  (VITAMIN D -3 PO) Take 1 capsule by mouth daily.     cyanocobalamin  (VITAMIN B12) 1000 MCG/ML injection Inject 1 mL (1,000 mcg total) into the muscle every 30 (thirty) days. 1 mL 3   docusate sodium  (COLACE) 100 MG capsule Take 1 capsule (100 mg total) by mouth 2 (two) times daily. While taking narcotic pain medicine. 30 capsule 0   EPINEPHrine  0.3 mg/0.3 mL IJ SOAJ injection Inject 0.3 mg into the muscle as needed for anaphylaxis.     FLUoxetine  (PROZAC ) 20 MG capsule Take 1 capsule (20 mg total) by mouth daily for 7 days. 90 capsule 0   ipratropium (ATROVENT ) 0.06 % nasal spray Place 2 sprays into both nostrils 2 (two) times daily as needed for rhinitis.     ketotifen  (ZADITOR ) 0.025 % ophthalmic solution Place 1 drop into both eyes daily.     levocetirizine (XYZAL) 5 MG tablet SMARTSIG:1 Tablet(s) By Mouth Every Evening     levothyroxine  (SYNTHROID ) 150 MCG tablet Take 150 mcg by mouth daily.  metoprolol  succinate (TOPROL -XL) 25 MG 24 hr tablet Take 1 tablet (25 mg total) by mouth daily. 90 tablet 3   naproxen sodium (ALEVE) 220 MG tablet Take 220 mg by mouth daily as needed (general pain, headache).     nitroGLYCERIN  (NITROSTAT ) 0.4 MG SL tablet Place 1 tablet (0.4 mg total) under the tongue every 5 (five) minutes as needed for chest pain. 25 tablet 3   omalizumab  (XOLAIR ) 150 MG injection 300 mg every 30 (thirty) days. Every 29th or 30th     pantoprazole  (PROTONIX ) 40 MG tablet Take 40 mg by mouth 2 (two) times daily.     polyethylene glycol (MIRALAX  / GLYCOLAX ) 17 g packet Take 17 g by mouth daily  as needed (constipation).     pramipexole  (MIRAPEX ) 0.125 MG tablet Take 3 tablets (0.375 mg total) by mouth at bedtime as needed (restless legs syndrome). 270 tablet 1   Probiotic Product (PROBIOTIC PO) Take 1 capsule by mouth daily.     TRELEGY ELLIPTA 200-62.5-25 MCG/ACT AEPB Inhale 1 puff into the lungs at bedtime.     No current facility-administered medications for this visit.     Psychiatric Specialty Exam: There were no vitals taken for this visit.There is no height or weight on file to calculate BMI. Review of Systems  General Appearance: Well Groomed  Eye Contact:  Good  Speech:  Clear and Coherent  Volume:  Normal  Mood:  Euthymic  Affect:  Appropriate  Thought Content: Logical   Suicidal Thoughts:  No  Homicidal Thoughts:  No  Thought Process:  Coherent and Goal Directed  Orientation:  Full (Time, Place, and Person)    Memory: Immediate;   Good  Judgment:  Good  Insight:  Fair  Concentration:  Concentration: Good  Recall:  not formally assessed   Fund of Knowledge: Good  Language: Good  Psychomotor Activity:  Normal  Akathisia:  No  AIMS (if indicated): not done  Assets:  Communication Skills Desire for Improvement Financial Resources/Insurance Housing  ADL's:  Intact  Cognition: WNL  Sleep:  Good   Metabolic Disorder Labs: Lab Results  Component Value Date   HGBA1C 5.0 06/13/2023   MPG 96.8 06/13/2023   MPG 134 08/16/2022   No results found for: PROLACTIN Lab Results  Component Value Date   CHOL 134 09/06/2023   TRIG 148 09/06/2023   HDL 38 (L) 09/06/2023   CHOLHDL 3.5 09/06/2023   VLDL 46 (H) 05/23/2016   LDLCALC 70 09/06/2023   LDLCALC 40 05/23/2016   Lab Results  Component Value Date   TSH 1.12 11/28/2023    Therapeutic Level Labs: No results found for: LITHIUM No results found for: VALPROATE No results found for: CBMZ   Screenings: GAD-7    Flowsheet Row [facility] Video Visit from 12/26/2023 in [facility] Office Visit from 11/28/2023 in [facility]  Total GAD-7 Score 9 16 19    Mini-Mental    Flowsheet Row Office Visit from 12/17/2023 in [facility] Office Visit from 07/22/2019 in [facility]  Total Score (max 30 points ) 30 29   PHQ2-9    Flowsheet Row Office Visit from 03/04/2024 in [facility] Video Visit from 01/16/2024 in [facility] Video Visit from 12/26/2023 in [facility] Office Visit from 12/17/2023 in [facility] Office Visit from 11/28/2023 in [facility]  PHQ-2 Total Score 1 3 2  0 5  PHQ-9 Total Score -- 8 6 -- 12   Flowsheet Row Office Visit from 03/04/2024 in [facility] Most recent reading at 03/04/2024  1:55 PM ED from 09/28/2023 in [facility] at [facility] Most recent reading at 09/28/2023  8:32 PM UC from 09/28/2023 in [facility] Most recent reading at 09/28/2023  7:56 PM  C-SSRS RISK CATEGORY No Risk No Risk No Risk    Collaboration of Care: Collaboration of Care: Medication Management AEB medication prescription and Other provider involved in patient's care AEB neurology chart review  Patient/Guardian was advised Release of Information must be obtained prior to any record release in order to collaborate their care with an outside provider. Patient/Guardian was advised if they have not already done so to contact the registration department to sign all necessary forms in order for us  to release information regarding their care.   Consent: Patient/Guardian gives verbal consent for treatment and assignment  of benefits for services provided during this visit. Patient/Guardian expressed understanding and agreed to proceed.    Jill CHRISTELLA Finder, MD 06/12/2024, 2:31 PM   Virtual Visit via Video Note  I connected with [name] on 06/12/24 at  2:30 PM EDT by a video enabled telemedicine application and verified that I am speaking with the correct person using two identifiers.  Location: Patient: Home Provider: Home Office   I discussed the limitations of evaluation and management by telemedicine and the availability of in person appointments. The patient expressed understanding and agreed to proceed.   I discussed the assessment and treatment plan with the patient. The patient was provided an opportunity to ask questions and all were answered. The patient agreed with the plan and demonstrated an understanding of the instructions.   The patient was advised to call back or seek an in-person evaluation if the symptoms worsen or if the condition fails to improve as anticipated.  I provided 20 minutes of non-face-to-face time during this encounter.   Jill CHRISTELLA Finder, MD

## 2024-06-11 ENCOUNTER — Other Ambulatory Visit (HOSPITAL_BASED_OUTPATIENT_CLINIC_OR_DEPARTMENT_OTHER)

## 2024-06-12 ENCOUNTER — Telehealth (HOSPITAL_BASED_OUTPATIENT_CLINIC_OR_DEPARTMENT_OTHER): Admitting: Psychiatry

## 2024-06-12 ENCOUNTER — Encounter (HOSPITAL_COMMUNITY): Payer: Self-pay | Admitting: Psychiatry

## 2024-06-12 DIAGNOSIS — F3340 Major depressive disorder, recurrent, in remission, unspecified: Secondary | ICD-10-CM | POA: Diagnosis not present

## 2024-06-12 DIAGNOSIS — F411 Generalized anxiety disorder: Secondary | ICD-10-CM | POA: Diagnosis not present

## 2024-06-12 MED ORDER — FLUOXETINE HCL 20 MG PO CAPS: 20.0000 mg | ORAL_CAPSULE | Freq: Every day | ORAL | 0 refills | Status: DC

## 2024-06-14 ENCOUNTER — Other Ambulatory Visit: Payer: Self-pay | Admitting: Nurse Practitioner

## 2024-06-14 DIAGNOSIS — F339 Major depressive disorder, recurrent, unspecified: Secondary | ICD-10-CM

## 2024-06-16 ENCOUNTER — Other Ambulatory Visit: Payer: Self-pay | Admitting: Cardiology

## 2024-06-16 MED ORDER — ARIPIPRAZOLE 2 MG PO TABS: 2.0000 mg | ORAL_TABLET | Freq: Every day | ORAL | 3 refills | Status: AC

## 2024-06-24 ENCOUNTER — Ambulatory Visit (INDEPENDENT_AMBULATORY_CARE_PROVIDER_SITE_OTHER): Admitting: Psychology

## 2024-06-24 ENCOUNTER — Ambulatory Visit

## 2024-06-24 DIAGNOSIS — R419 Unspecified symptoms and signs involving cognitive functions and awareness: Secondary | ICD-10-CM

## 2024-06-24 NOTE — Progress Notes (Unsigned)
 NEUROPSYCHOLOGICAL EVALUATION [place]. [facility]  [place] Department of Neurology  Date of Evaluation: 06/24/2024  REASON FOR REFERRAL   [name] is a 68 year old, right-handed, White female with 16 years of formal education. She was referred for neuropsychological evaluation by Camie Sevin, PA-C, to assess current neurocognitive functioning, document potential cognitive deficits, and assist with treatment planning. This is her first neuropsychological evaluation.  SUMMARY OF RESULTS   Premorbid cognitive abilities are estimated to be in the high average range based on word reading and sociodemographic factors. Consistent with this baseline estimate, performance today was intact across all domains, including attention/working memory, processing speed, executive functioning, language, visuospatial abilities, and learning/memory. Most notably, some of her strongest scores were observed on tasks of learning/memory.  On self-report questionnaires, she endorsed mild symptoms of depression and anxiety. She did not report excessive daytime sleepiness.   DIAGNOSTIC IMPRESSION   Results of the current evaluation indicated normal cognitive functioning. She does not show evidence of a neurocognitive disorder at this time. Her overall profile reflects healthy cognitive aging and well-preserved cognitive and functional abilities. Subjective cognitive concerns are likely multifactorial, related to normal aging, sleep disturbance, chronic fatigue, chronic pain, and mood symptoms/stress. Improvement in modifiable factors may lead to a reduction in perceived cognitive difficulties. Of note, she expressed concern about her family history of Alzheimer's disease, as well as worry stemming from some of the findings of the initial workup she completed when she enrolled in an Alzheimer's research study. While this concern is understandable, her current cognitive profile does not suggest evidence of a  neurodegenerative disorder at this time.  Results provide a baseline for future comparison should reevaluation become necessary.  ICD-10 Codes: R41.9 Cognitive concerns with normal neuropsychological exam   RECOMMENDATIONS   In consultation with your doctor, schedule cognitive reevaluation on an as-needed basis to assess for cognitive decline and update treatment recommendations. Reevaluation should occur during a period of medical and affective stability.  Continue treatment for depression and anxiety, especially given that emotional distress can exacerbate cognitive difficulties. Discuss current medication regimen with your prescribing provider to ensure you are receiving maximum benefit.  Prioritize physical health through diet, exercise, and sleep.  Exercise: Regular physical activity supports cardiovascular health, improves mood, and helps preserve mobility and independence. Aim for at least 150 minutes of moderate aerobic exercise per week (e.g., brisk walking, swimming, gardening). Even light activity or short walks can be beneficial and help boost your energy over time.  Diet: A brain-healthy diet such as the Mediterranean or MIND diet is rich in fruits, vegetables, whole grains, healthy fats, and lean proteins. This type of diet has been linked to a reduced risk of cognitive decline. Focus on incorporating these foods at your own pace, aiming for consistent, balanced meals.  Sleep: Getting adequate, quality sleep is essential for healthy aging. You may benefit from the consistent implementation of sleep hygiene techniques, including:  Go to bed and get up at the same time each day to help your body establish a regular rhythm. Establish and maintain a bedtime routine. Certain activities such as stretching, meditating, listening to soft music, or reading ~15 minutes before bedtime can be a great way to regularly get your brain and body ready for sleep. Avoid taking naps during the  day. Avoid alcohol and caffeine for 5 or 6 hours before going to bed. Get regular exercise, but not in the hours before bedtime. Use comfortable bedding and maintain a cool temperature in your bedroom.  Block out light and distracting noise. Avoid watching television or using your phone/computer in bed. Avoid staying in bed if you have difficulty falling asleep. If you have not been able to get to sleep after about 20 minutes or more, get up and do something calming or boring until you feel sleepy, then return to bed and try again.  Continue staying socially and mentally engaged. Maintaining strong social connections and regularly stimulating your brain can help protect against cognitive decline. This includes staying connected with friends and family, volunteering, or participating in community groups. Mentally engaging activities--such as reading, doing puzzles, playing strategy games, or learning a new language or musical instrument--promote brain plasticity. If you are interested in activities to support cognitive engagement, this site offers a variety of apps and games organized by difficulty level:  https://www.barrowneuro.org/get-to-know-barrow/centers-programs/neurorehabilitation-center/neuro-rehab-apps-and-games/  Consider implementing compensatory strategies to maximize independence and maintain daily functioning. Examples include:   Adhere to routine. Compensatory strategies work best when they are used consistently. Use a planner, calendar, or white board that has the schedule and important events for the day clearly listed to reference and cross off when tasks are complete.   Ask for written information, especially if it is new or unfamiliar (e.g., information provided at a doctor's appointment).   Create an organized environment. Keep items that can be easily misplaced in a sensible location and get into the habit of always returning the items to those places.   Pay attention and  reduce distractions. Make a point of focusing attention on information you want to remember. One-on-one interaction is more likely to facilitate attention and minimize distraction. Make eye contact and repeat the information out loud after you hear it. Reduce interruptions or distractions especially when attempting to learn new information.   Create associations. When learning something new, think about and understand the information. Explain it in your own words or try to associate it with something you already know. Take notes to help remember important details.  Evaluate goals and plan accordingly. When confronted by many different tasks, begin by making a list that prioritizes each task and estimates the time it will take to complete. Break down complicated tasks into smaller, more manageable steps.   Focus on one task at a time and complete each task before starting another. Avoid multitasking.  DISPOSITION   Patient will follow up with the referring provider, Ms. Wertman. No follow-up neuropsychological testing was scheduled at this time. Please feel free to refer the patient for repeated evaluation if she shows a significant change in neurocognitive status. She will be provided verbal feedback in approximately one week regarding the findings and impression during this visit.  The remainder of the report includes the details of the patient's background and a table of results from the current evaluation, which support the summary and recommendations described above.  BACKGROUND   History of Presenting Illness: The following information was obtained from a review of medical records and an interview with the patient. Briefly, the patient enrolled in an Alzheimer's disease research study due to concerns related to her mother's history of Alzheimer's disease. 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 However, she subsequently required Achilles tendon surgery and had to withdraw from the study. Despite not completing participation, she continued to have cognitive concerns and therefore established care with Lauraine Sevin, PA-C, at [facility] on  03/17/2024. MoCA = 28/30. She was referred for neuropsychological evaluation accordingly.  Cognitive Functioning: During today's appointment, the patient reported cognitive concerns over the past five to six years that have gradually worsened. Specifically, she noted short-term memory issues, such as forgetting where she left off in a book, sometimes forgetting details of conversations, relying more heavily on written reminders, and forgetting why she walked into a room. She also acknowledged slower processing speed as well as occasional difficulties with attention and word finding. She otherwise denied major problems with navigation and executive functioning (e.g., planning, organizing, problem-solving).  Physical Functioning: Patient reported difficulties with sleep initiation and maintenance. She is consistent with using her CPAP machine nightly. She endorsed restless leg syndrome and occasionally acting out her dreams. She also reported chronic fatigue, which is usually worsened by certain weather, and chronic pain related to fibromyalgia. Appetite is stable. No changes to sense of taste or smell were reported. Regarding her vision, she acknowledged that she probably needs a new prescription for her glasses and mentioned that she is in the early stages of cataracts. Hearing is stable. She denied recent falls but noted she feels a little less steady on her feet. She denied any tremors.  Emotional Functioning: Patient acknowledged some recent stressors but described her recent mood as pretty upbeat and happy. She denied suicidal ideation. She engages in many hobbies such as reading, needlework, and making jewelry, as well as  collecting buttons and first editions.  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  Other Relevant Medical History: Remarkable for fibromyalgia, restless leg syndrome, hyperlipidemia, sleep apnea, asthma, and Hashimoto's thyroiditis. Please refer to the medical record for a more comprehensive problem list. History of stroke, CNS infection, significant head injury, or seizure was denied. She reported only one head injury, resulting from a fall; however, she did not lose consciousness and required only a few stitches  Current Medications: Per record, acetaminophen , albuterol , aripiprazole , aspirin , atorvastatin , bupropion , docusate sodium , fluoxetine , ipratropium, ketotifen , levothyroxine , metoprolol , MiraLAX , naproxen, nitroglycerin , omalizumab , pramipexole , probiotic, Trelegy Ellipta, vitamin B12, vitamin C, and vitamin D3.   Functional Status: Patient independently performs all basic and instrumental (e.g., driving, finances, medication) activities of daily living without difficulty.  Family Neurological History: Remarkable for Alzheimer's disease in the patient's mother and stroke in numerous family members.  Psychiatric History: Remarkable for depression and anxiety, currently managed with medication prescribed by psychiatry. She is not currently in therapy but acknowledged having participated in counseling previously. History of suicidal ideation and psychiatric hospitalizations was not reported. Of note, she reported experiencing hallucinations in the past; however, these episodes occurred only in the context of medications such as Effexor  or Ambien.  Substance Use History: Patient reported infrequent alcohol consumption. Current use of nicotine, marijuana, and illicit substances was denied.  Social and Developmental History: Patient was born in [place], [place]. History  of perinatal complications and developmental delays was not reported. She is married with two stepchildren. She lives with her husband in their private residence.   Educational and Occupational History: No history of childhood learning disability, special education services, or grade retention was reported. Patient described herself as a pretty good student, stating that she was in gifted classes from around fifth grade through high school. She earned a bachelor's degree in psychology and subsequently took some graduate courses. 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  BEHAVIORAL OBSERVATIONS   Patient arrived on time and was unaccompanied. She ambulated independently and without gait disturbance. She was alert and fully oriented. She was appropriately groomed and dressed for the setting. No significant sensory or motor abnormalities were observed. Vision and hearing were adequate for testing purposes. Speech was of normal rate, prosody, and volume. No conversational word-finding difficulties, paraphasic errors, or dysarthria were observed. Comprehension was conversationally intact. Thought processes were linear, logical, and coherent. Thought content was organized and devoid of delusions. Insight appeared appropriate. Affect was even and congruent with euthymic mood. She was cooperative and gave adequate effort during testing, including on standalone and embedded measures of performance validity. Results are thought to accurately reflect her cognitive functioning at this time.  NEUROPSYCHOLOGICAL TESTING RESULTS   Tests Administered: Animal Naming Test; Brief Visuospatial Memory Test-Revised (BVMT-R) - Form 1; Controlled Oral Word Association Test (COWAT): FAS; Delis-Kaplan Executive Function System (D-KEFS) - Subtest(s): Color-Word Interference Test;  Epworth Sleepiness Scale (ESS); Geriatric Anxiety Scale-10 Item (GAS-10); Geriatric Depression Scale Short Form (GDS-SF); Hopkins Verbal Learning Test Revised (HVLT-R) - From 1; Judgment of Line Orientation (JLO) - Form V; Neuropsychological Assessment Battery (NAB) - Subtest(s): Naming Form 1; Standalone performance validity test (PVT); Test of Premorbid Functioning (TOPF); Trail Making Test (TMT); Wechsler Adult Intelligence Scale Fifth Edition (WAIS-5) - Subtest(s): Similarities, Clinical cytogeneticist, Digits Forward, Digit Sequencing, Coding, Symbol Search, Digits Backward; and Wechsler Memory Scale Fourth Edition (WMS-IV) - Subtest(s): Logical Memory (LM).  Test results are provided in the table below. Whenever possible, the patient's scores were compared against age-, sex-, and education-corrected normative samples. Interpretive descriptions are based on the AACN consensus conference statement on uniform labeling (Guilmette et al., 2020).  PREMORBID FUNCTIONING RAW  RANGE  TOPF 61 StdS=118 High Average  ATTENTION & WORKING MEMORY RAW  RANGE  WAIS-5 Digits Forward -- ss=12 High Average  WAIS-5 Digits Backward -- ss=11 Average  WAIS-5 Digit Sequencing -- ss=7 Low Average  PROCESSING SPEED RAW  RANGE  Trails A 47''0e T=37 Low Average  WAIS-5 Coding  -- ss=8 Average  WAIS-5 Symbol Search -- ss=11 Average  DKEFS CWIT Color Naming 31''0e ss=11 Average  DKEFS CWIT Word Reading 27''1e ss=9 Average  EXECUTIVE FUNCTION RAW  RANGE  Trails B 83''0e T=47 Average  WAIS-5 Similarities -- ss=10 Average  COWAT Letter Fluency 15+9+11 T=42 Low Average  DKEFS CWIT Inhibition 71''2e ss=10 Average  DKEFS CWIT Inhibition/Switching 69''2e ss=11 Average  LANGUAGE RAW  RANGE  COWAT Letter Fluency 15+9+11 T=42 Low Average  Animal Naming Test 20 T=48 Average  NAB Naming Test 31/31 T=56 WNL  VISUOSPATIAL RAW  RANGE  WAIS-5 Block Design -- ss=10 Average  JLO C/S=29/30 86+%ile WNL  BVMT-R Copy Trial 12/12 -- WNL  VERBAL  LEARNING & MEMORY RAW  RANGE  HVLT Learning Trials (6+9+11)/36 T=54 Average  HVLT Delayed Recall 11/12 T=59 High Average  HVLT Recognition Hits 12 -- --  HVLT Recognition False Positives 0 -- --  HVLT Discrimination Index 12 T=59 High Average  WMS-IV LM-I  (13+14+17)/53 ss=15 Above Average  WMS-IV LM-II  (14+16)/39 ss=15 Above Average  WMS-IV LM Recognition  (8+15)/23 >75%ile High Average to Exceptionally High  VISUAL LEARNING & MEMORY RAW  RANGE  BVMT-R Total Recall (7+9+11)/36 T=60 High Average  BVMT-R Delayed Recall 10/12 T=59 High Average  BVMT-R Recognition Hits 6 >16%ile WNL  BVMT-R Recognition False Alarms 0 >16%ile WNL  BVMT-R Recognition Discrimination Index 6 >16%ile WNL  QUESTIONNAIRES RAW  RANGE  GDS-SF 5 -- Mild  GAS-10 9 -- Mild  ESS 7 -- WNL  *Note: ss = scaled score; StdS = standard score; T = t-score; C/S = corrected raw score; WNL = within normal limits; BNL= below normal limits; D/C = discontinued. Scores from skewed distributions are typically interpreted as WNL (>=16th %ile) or BNL (<16th %ile).   INFORMED CONSENT   Patient was provided with a verbal description of the nature and purpose of the neuropsychological evaluation. Also reviewed were the foreseeable risks and/or discomforts and benefits of the procedure, limits of confidentiality, and mandatory reporting requirements of this provider. Patient was given the opportunity to have their questions answered. Oral consent to participate was provided by the patient.   This report was prepared as part of a clinical evaluation and is not intended for forensic use.  SERVICE   This evaluation was conducted by Renda Beckwith, Psy.D. In addition to time spent directly with the patient, total professional time (120 minutes) includes record review, integration of relevant medical history, test selection, interpretation of findings, and report preparation. Additionally, all testing was administered and scored directly by Dr.  Beckwith (130 minutes).  Psychiatric Diagnostic Evaluation Services (Professional): 09208 x 1 Neuropsychological Testing Evaluation Services (Professional): 03867 x 1 Neuropsychological Testing Evaluation Services (Professional): 03866 x 1 Neuropsychological Test Administration and Scoring: [phone] x 1 Neuropsychological Test Administration and Scoring: [phone] x 3  This report was generated using voice recognition software. While this document has been carefully reviewed, transcription errors may be present. I apologize in advance for any inconvenience. Please contact me if further clarification is needed.            Renda Beckwith, Psy.D.             Neuropsychologist

## 2024-06-25 ENCOUNTER — Encounter: Payer: Self-pay | Admitting: Psychology

## 2024-07-02 ENCOUNTER — Ambulatory Visit (INDEPENDENT_AMBULATORY_CARE_PROVIDER_SITE_OTHER): Admitting: Psychology

## 2024-07-02 DIAGNOSIS — R419 Unspecified symptoms and signs involving cognitive functions and awareness: Secondary | ICD-10-CM

## 2024-07-02 NOTE — Progress Notes (Addendum)
   NEUROPSYCHOLOGY FEEDBACK SESSION [name]. [name]  [place] Department of Neurology  Date of Feedback Session: 07/02/2024  REASON FOR REFERRAL   [name] is a 68 year old, right-handed, White female with 16 years of formal education. She was referred for neuropsychological evaluation by Camie Sevin, PA-C, to assess current neurocognitive functioning, document potential cognitive deficits, and assist with treatment planning. This is her first neuropsychological evaluation.  FEEDBACK   Patient completed a comprehensive neuropsychological evaluation on 06/24/2024. Please refer to that encounter for the full report and recommendations. Briefly, results  indicated normal cognitive functioning. Her overall profile reflects healthy cognitive aging and well-preserved cognitive and functional abilities. Subjective cognitive concerns are likely multifactorial, related to normal aging, sleep disturbance, chronic fatigue, chronic pain, and mood symptoms/stress.  Today, the patient was unaccompanied. She was provided verbal feedback regarding the findings and impression during this visit, and her questions were answered. A copy of the report was provided at the conclusion of the visit.  DISPOSITION   Patient will follow up with the referring provider, Ms. Wertman. No follow-up neuropsychological testing was scheduled at this time. Please feel free to refer the patient for repeated evaluation if she shows a significant change in neurocognitive status.  SERVICE   This feedback session was conducted by Renda Beckwith, Psy.D. Given that the time spent preparing, conducting, and documenting the current feedback session totaled only 25 minutes, no billing was submitted.  This report was generated using voice recognition software. While this document has been carefully reviewed, transcription errors may be present. I apologize in advance for any inconvenience. Please contact me if further  clarification is needed.

## 2024-07-05 DIAGNOSIS — Z23 Encounter for immunization: Secondary | ICD-10-CM | POA: Diagnosis not present

## 2024-07-08 DIAGNOSIS — L501 Idiopathic urticaria: Secondary | ICD-10-CM | POA: Diagnosis not present

## 2024-07-15 ENCOUNTER — Encounter: Admitting: Psychology

## 2024-07-18 ENCOUNTER — Ambulatory Visit: Admitting: Physician Assistant

## 2024-07-20 ENCOUNTER — Other Ambulatory Visit: Payer: Self-pay | Admitting: Cardiology

## 2024-07-20 ENCOUNTER — Other Ambulatory Visit: Payer: Self-pay | Admitting: Sports Medicine

## 2024-07-23 DIAGNOSIS — G4733 Obstructive sleep apnea (adult) (pediatric): Secondary | ICD-10-CM | POA: Diagnosis not present

## 2024-07-25 ENCOUNTER — Emergency Department (HOSPITAL_BASED_OUTPATIENT_CLINIC_OR_DEPARTMENT_OTHER)
Admission: EM | Admit: 2024-07-25 | Discharge: 2024-07-25 | Disposition: A | Discharge status: Home / Self Care | Attending: Emergency Medicine | Admitting: Emergency Medicine

## 2024-07-25 ENCOUNTER — Emergency Department (HOSPITAL_BASED_OUTPATIENT_CLINIC_OR_DEPARTMENT_OTHER)

## 2024-07-25 ENCOUNTER — Encounter (HOSPITAL_BASED_OUTPATIENT_CLINIC_OR_DEPARTMENT_OTHER): Payer: Self-pay | Admitting: Emergency Medicine

## 2024-07-25 ENCOUNTER — Other Ambulatory Visit: Payer: Self-pay

## 2024-07-25 DIAGNOSIS — Z8542 Personal history of malignant neoplasm of other parts of uterus: Secondary | ICD-10-CM | POA: Diagnosis not present

## 2024-07-25 DIAGNOSIS — Z91010 Allergy to peanuts: Secondary | ICD-10-CM | POA: Insufficient documentation

## 2024-07-25 DIAGNOSIS — S0990XA Unspecified injury of head, initial encounter: Secondary | ICD-10-CM | POA: Diagnosis not present

## 2024-07-25 DIAGNOSIS — Z91040 Latex allergy status: Secondary | ICD-10-CM | POA: Insufficient documentation

## 2024-07-25 DIAGNOSIS — S199XXA Unspecified injury of neck, initial encounter: Secondary | ICD-10-CM | POA: Diagnosis not present

## 2024-07-25 DIAGNOSIS — S0011XA Contusion of right eyelid and periocular area, initial encounter: Secondary | ICD-10-CM | POA: Diagnosis not present

## 2024-07-25 DIAGNOSIS — M79641 Pain in right hand: Secondary | ICD-10-CM | POA: Diagnosis not present

## 2024-07-25 DIAGNOSIS — M47812 Spondylosis without myelopathy or radiculopathy, cervical region: Secondary | ICD-10-CM | POA: Diagnosis not present

## 2024-07-25 DIAGNOSIS — W19XXXA Unspecified fall, initial encounter: Secondary | ICD-10-CM | POA: Diagnosis not present

## 2024-07-25 DIAGNOSIS — S0591XA Unspecified injury of right eye and orbit, initial encounter: Secondary | ICD-10-CM | POA: Diagnosis present

## 2024-07-25 DIAGNOSIS — S0993XA Unspecified injury of face, initial encounter: Secondary | ICD-10-CM | POA: Diagnosis not present

## 2024-07-25 DIAGNOSIS — Z7982 Long term (current) use of aspirin: Secondary | ICD-10-CM | POA: Diagnosis not present

## 2024-07-25 DIAGNOSIS — Z981 Arthrodesis status: Secondary | ICD-10-CM | POA: Diagnosis not present

## 2024-07-25 DIAGNOSIS — S0083XA Contusion of other part of head, initial encounter: Secondary | ICD-10-CM | POA: Diagnosis not present

## 2024-07-25 DIAGNOSIS — S0511XA Contusion of eyeball and orbital tissues, right eye, initial encounter: Secondary | ICD-10-CM | POA: Diagnosis not present

## 2024-07-25 DIAGNOSIS — M4802 Spinal stenosis, cervical region: Secondary | ICD-10-CM | POA: Diagnosis not present

## 2024-07-25 NOTE — ED Triage Notes (Signed)
 Pt caox4 ambulatory NAD reporting yesterday afternoon, pt states she was seen this morning at [facility] for hand injury for fall but they recommended her come to ED for CT of the head d/t hitting her head and taking aspirin . Pt denies LOC.

## 2024-07-25 NOTE — ED Provider Notes (Signed)
 [place] EMERGENCY DEPARTMENT AT [facility] Provider Note   CSN: [id number] Arrival date & time: 07/25/24  1219     Patient presents with: [place]   [name] is a 68 y.o. female.  Patient with past history significant for endometrial cancer, bowel obstruction, memory impairment presents the emergency department with concerns of a fall.  Reportedly took a mechanical fall when she missed stepped off a curb.  She states that she struck the ground with the right side of her face.  Has some bruising and swelling to the right periorbital region with some tenderness over this area.  Denies significant visual disturbance.  Endorses a slight headache but denies any vomiting.  No reported loss of consciousness.    Fall       Prior to Admission medications   Medication Sig Start Date End Date Taking? Authorizing Provider  acetaminophen  (TYLENOL ) 500 MG tablet Take 2 tablets (1,000 mg total) by mouth every 6 (six) hours as needed. 06/14/18   Tammy Sor, PA-C  albuterol  (PROVENTIL  HFA;VENTOLIN  HFA) 108 (90 Base) MCG/ACT inhaler Inhale 2 puffs into the lungs every 4 (four) hours as needed for wheezing or shortness of breath. 06/13/16   [provider]  ARIPiprazole  (ABILIFY ) 2 MG tablet Take 1 tablet (2 mg total) by mouth daily. 06/16/24   Sherlynn Madden, MD  Ascorbic Acid (VITAMIN C PO) Take 1 tablet by mouth daily.    [provider]  aspirin  EC 81 MG tablet Take 81 mg by mouth at bedtime.    [provider]  atorvastatin  (LIPITOR) 10 MG tablet TAKE 1 TABLET BY MOUTH DAILY 06/19/24   Jeffrie Oneil BROCKS, MD  buPROPion  (WELLBUTRIN  XL) 150 MG 24 hr tablet TAKE 3 TABLETS BY MOUTH DAILY 07/21/24   Sherlynn Madden, MD  Cholecalciferol  (VITAMIN D -3 PO) Take 1 capsule by mouth daily.    [provider]  cyanocobalamin  (VITAMIN B12) 1000 MCG/ML injection Inject 1 mL (1,000 mcg total) into the muscle every 30 (thirty) days. 11/29/22   Cleotilde Garnette HERO, MD  docusate sodium  (COLACE) 100 MG capsule Take 1 capsule (100 mg total) by mouth 2 (two) times daily. While taking narcotic pain medicine. 01/04/23   Aniceto Eva Grebe, PA-C  EPINEPHrine  0.3 mg/0.3 mL IJ SOAJ injection Inject 0.3 mg into the muscle as needed for anaphylaxis.    [provider]  FLUoxetine  (PROZAC ) 20 MG capsule Take 1 capsule (20 mg total) by mouth daily. 06/12/24 09/10/24  Carvin Arvella HERO, MD  ipratropium (ATROVENT ) 0.06 % nasal spray Place 2 sprays into both nostrils 2 (two) times daily as needed for rhinitis. 01/02/20   [provider]  ketotifen  (ZADITOR ) 0.025 % ophthalmic solution Place 1 drop into both eyes daily.    [provider]  levocetirizine (XYZAL) 5 MG tablet SMARTSIG:1 Tablet(s) By Mouth Every Evening 08/13/23   [provider]  levothyroxine  (SYNTHROID ) 150 MCG tablet Take 150 mcg by mouth daily. 08/05/23   [provider]  metoprolol  succinate (TOPROL -XL) 25 MG 24 hr tablet Take 1 tablet (25 mg total) by mouth daily. Pt needs to schedule appt with provider for further refills 07/21/24   Jeffrie Oneil BROCKS, MD  naproxen sodium (ALEVE) 220 MG tablet Take 220 mg by mouth daily as needed (general pain, headache).    [provider]  nitroGLYCERIN  (NITROSTAT ) 0.4 MG SL tablet Place 1 tablet (0.4 mg total) under the tongue every 5 (five) minutes as needed for chest pain. 02/09/22  Thukkani, Arun K, MD  omalizumab  (XOLAIR ) 150 MG injection 300 mg every 30 (thirty) days. Every 29th or 30th    [provider]  pantoprazole  (PROTONIX ) 40 MG tablet Take 40 mg by mouth 2 (two) times daily. 08/09/20   [provider]  polyethylene glycol (MIRALAX  / GLYCOLAX ) 17 g packet Take 17 g by mouth daily as needed (constipation).    [provider]  pramipexole  (MIRAPEX ) 0.125 MG tablet Take 3 tablets (0.375 mg total) by mouth at bedtime as needed (restless legs syndrome). 11/28/23   Sherlynn Madden, MD   Probiotic Product (PROBIOTIC PO) Take 1 capsule by mouth daily.    [provider]  DOMINIC BECK 200-62.5-25 MCG/ACT AEPB Inhale 1 puff into the lungs at bedtime. 05/22/23   [provider]    Allergies: Asa [aspirin ], Bee venom, Peanut-containing drug products, Ambien [zolpidem tartrate], Tape, Cipro  [ciprofloxacin  hcl], Latex, Minocycline, and Shellfish allergy    Review of Systems  HENT:  Positive for facial swelling.   All other systems reviewed and are negative.   Updated Vital Signs BP 137/63 (BP Location: Right Arm)   Pulse (!) 58   Temp 98.2 F (36.8 C) (Oral)   Resp 17   Ht 5' 5 (1.651 m)   Wt 68.5 kg   SpO2 100%   BMI 25.13 kg/m   Physical Exam Vitals and nursing note reviewed.  Constitutional:      General: She is not in acute distress.    Appearance: She is well-developed.  HENT:     Head: Normocephalic.      Comments: Bruising and swelling the superior and lateral portions of the right orbit. No obvious injury to the eye and no appreciable crepitus to palpation of the orbit. Eyes:     Conjunctiva/sclera: Conjunctivae normal.  Cardiovascular:     Rate and Rhythm: Normal rate and regular rhythm.     Heart sounds: No murmur heard. Pulmonary:     Effort: Pulmonary effort is normal. No respiratory distress.     Breath sounds: Normal breath sounds.  Abdominal:     Palpations: Abdomen is soft.     Tenderness: There is no abdominal tenderness.  Musculoskeletal:        General: No swelling.     Cervical back: Neck supple.  Skin:    General: Skin is warm and dry.     Capillary Refill: Capillary refill takes less than 2 seconds.  Neurological:     Mental Status: She is alert.  Psychiatric:        Mood and Affect: Mood normal.     (all labs ordered are listed, but only abnormal results are displayed) Labs Reviewed - No data to display  EKG: None  Radiology: CT Maxillofacial Wo Contrast Result Date: 07/25/2024 EXAM: CT OF THE FACE  WITHOUT CONTRAST 07/25/2024 01:56:12 PM TECHNIQUE: CT of the face was performed without the administration of intravenous contrast. Multiplanar reformatted images are provided for review. Automated exposure control, iterative reconstruction, and/or weight based adjustment of the mA/kV was utilized to reduce the radiation dose to as low as reasonably achievable. COMPARISON: MR head without and with contrast 04/11/2024. CLINICAL HISTORY: Facial trauma, blunt. Perpatient fell on curb, takes baby Asprin. FINDINGS: FACIAL BONES: No acute facial fracture. No mandibular dislocation. No suspicious bone lesion. ORBITS: Globes are intact. No acute traumatic injury. No inflammatory change. SINUSES AND MASTOIDS: No acute abnormality. SOFT TISSUES: No acute abnormality. IMPRESSION: 1. No acute facial fracture. Electronically signed by: Lonni Necessary  MD 07/25/2024 02:45 PM EDT RP Workstation: [id number]   CT Head Wo Contrast Result Date: 07/25/2024 EXAM: CT HEAD WITHOUT CONTRAST 07/25/2024 01:56:12 PM TECHNIQUE: CT of the head was performed without the administration of intravenous contrast. Automated exposure control, iterative reconstruction, and/or weight based adjustment of the mA/kV was utilized to reduce the radiation dose to as low as reasonably achievable. COMPARISON: CT head with contrast 04/11/2024 and CT head without contrast 09/28/2023. CLINICAL HISTORY: Head trauma, moderate-severe. Perpatient fell on curb, takes baby Asprin. FINDINGS: BRAIN AND VENTRICLES: Subcortical white matter hypoattenuation is moderately advanced for age, stable from the prior studies. No acute hemorrhage. No evidence of acute infarct. No hydrocephalus. No extra-axial collection. No mass effect or midline shift. ORBITS: No acute abnormality. SINUSES: No acute abnormality. SOFT TISSUES AND SKULL: No acute soft tissue abnormality. No skull fracture. IMPRESSION: 1. No acute intracranial abnormality. 2. Moderately advanced subcortical  white matter hypoattenuation, stable from prior studies. Electronically signed by: Lonni Necessary MD 07/25/2024 02:36 PM EDT RP Workstation: [id number]   CT Cervical Spine Wo Contrast Result Date: 07/25/2024 CLINICAL DATA:  Neck trauma (Age >= 65y) Status post fall.  On baby aspirin . EXAM: CT CERVICAL SPINE WITHOUT CONTRAST TECHNIQUE: Multidetector CT imaging of the cervical spine was performed without intravenous contrast. Multiplanar CT image reconstructions were also generated. RADIATION DOSE REDUCTION: This exam was performed according to the departmental dose-optimization program which includes automated exposure control, adjustment of the mA and/or kV according to patient size and/or use of iterative reconstruction technique. COMPARISON:  Cervical myelogram CT 01/31/2011. FINDINGS: Alignment: Straightening without focal angulation or listhesis. Skull base and vertebrae: No evidence of acute cervical spine fracture or traumatic subluxation. Remote C5-6 disc arthroplasty with interval C6-7 ACDF. The hardware appears intact. Soft tissues and spinal canal: No prevertebral fluid or swelling. No visible canal hematoma. Disc levels: Multilevel facet arthropathy with interfacetal ankylosis bilaterally from C2 through C4. Progressive moderate foraminal narrowing on the left at C4-5. Mild-to-moderate osseous foraminal narrowing bilaterally at C7-T1. Upper chest: Clear lung apices. Other: Bilateral carotid atherosclerosis. IMPRESSION: 1. No evidence of acute cervical spine fracture, traumatic subluxation or static signs of instability. 2. Remote C5-6 disc arthroplasty with interval C6-7 ACDF. 3. Progressive moderate foraminal narrowing on the left at C4-5. Electronically Signed   By: Elsie Perone M.D.   On: 07/25/2024 14:33     Procedures   Medications Ordered in the ED - No data to display                                  Medical Decision Making Amount and/or Complexity of Data  Reviewed Radiology: ordered.   This patient presents to the ED for concern of fall.  Differential diagnosis includes mechanical fall, facial contusion, maxillofacial fracture, intracranial hemorrhage   Imaging Studies ordered:  I ordered imaging studies including CT head, CT maxillofacial, CT cervical spine I independently visualized and interpreted imaging which showed negative for any acute findings or injuries I agree with the radiologist interpretation   Problem List / ED Course:  Patient presents to the emergency department with concerns of mechanical fall.  Past history significant for endometrial cancer, bowel obstruction, memory impairment.  [facility] for this fall and was advised to come to the emergency department for for evaluation given her reported head injury from her fall and concurrent aspirin  use.  She takes 81 mg baby  aspirin  daily.  She denies any significant headache and has had only some slight nausea but denies any vomiting.  She also endorses some slight blurring of her vision in her right eye but she feels that this is likely due to some of the swelling that she has noted.  She denies any eye pain in the eye itself or any clouding of her vision. On exam, patient has no abnormal heart or lung sounds.  There is only slight tenderness towards the cervical spine.  Rotation of the cervical spine unremarkable.  Point tenderness primarily towards the right eyebrow but no lesions are seen.  Pupils are equal and reactive bilaterally.  EOMs intact. Based on patient's mechanism, suspect likely primarily superficial injury but due to her aspirin  use, will evaluate CT imaging of her head and neck as well as her face for further assessment to ensure no signs of any intracranial normality or possible occult fracture. CT head, neck, and maxillofacial are all thankfully negative. Suspect primarily superficial abrasion/contusion to the  right eyebrow but no signs of fracture at this time. Will discharge home with instructions to follow up closely with PCP for further evaluation and hand surgery as recommend from orthopedic urgent care. Return precautions discussed. Discharged home in stable condition.   Social Determinants of Health:  None  Final diagnoses:  Fall, initial encounter  Contusion of right eyelid, initial encounter    ED Discharge Orders     None          Cecily Legrand LABOR, PA-C 07/25/24 1457    Armenta Canning, MD 07/31/24 1652

## 2024-07-25 NOTE — Discharge Instructions (Signed)
 You were seen in the emergency department today for concerns of a fall.  You had imaging of your head, neck, and face ordered given the way in which you fell on the ground.  You thankfully have no signs of any new fracture, head bleeding, or any other abnormalities.  Take Tylenol  ibuprofen for pain.  You should use ice over the left eyebrow for the next day or so to help reduce swelling.  For any concerns of worsening symptoms, return to the emergency department.

## 2024-07-25 NOTE — ED Notes (Signed)
 Discharge instructions, follow up care, and pain management reviewed and explained, pt verbalized understanding. Pt caox4 ambulatory NAD on d/c.

## 2024-07-29 NOTE — Progress Notes (Signed)
 Cardiology Office Note:    Date:  08/01/2024   ID:  [name], DOB 1956/05/17, MRN [id number]  PCP:  Sherlynn Madden, MD  Cardiologist:  Oneil Parchment, MD     Referring MD: Sherlynn Madden, *   Chief Complaint: follow-up of PAT  History of Present Illness:    [name] is a 68 y.o. female with a history of aortic atherosclerosis, paroxysmal atrial tachycardia, PFO, hyperlipidemia, obstructive sleep apnea on CPAP, esophageal spasms, Hashimoto's thyroiditis, pernicious anemia, fibromyalgia, chronic fatigue syndrome, restless leg syndrome, and endometrial cancer who is followed by Dr. Parchment and presents today for follow-up.   Patient has a history of atypical chest pain and paroxysmal atrial tachycardia in the past. Transcranial dopplers in 07/2019 showed a PFO but not felt to have any indication for closure. No history of stroke. She has been on Aspirin  for this. Myoview  in 02/2022 was low risk with no evidence of ischemia.   She was last seen by Artist Pouch, PA-C, in 07/2023 at which time she was doing well from a cardiac standpoint.   She was recently seen in the ED on 07/25/2024 after a mechanical fall after she tripped on a curb and fell and struck the ground with the right side of her face. Head/ C-spine/ Maxillofacial CT showed no acute findings or fractures and she was felt to be stable for discharge.   Patient presents today for follow-up.  Patient describes a funny sensation in her chest when she lays down at night to go to bed. She states she is only aware of this because all the noises are gone.  This does not sound like any concerning cardiac chest pain.  However, she also reports rare episodes of chest pressure that occur about 1-2 times a year.  She had one of these episodes last week when she was just doing routine activities around her house and it lasted for a couple of hours and then resolved on its own.  She has some shortness of breath which she attributes  to her asthma.  This usually occurs with activity or if it is really hot and humid outside but improves with her inhaler.  This is not new and is stable.  She denies any shortness of breath at rest.  No orthopnea or PND.  No edema.  She denies any palpitations.  She occasionally has some lightheadedness/dizziness with quick position changes but denies any syncope.  She is concerned because one of the CTs that they did in the ED after her mechanical fall showed bilateral carotid atherosclerosis and she has a strong family history of severe strokes.  EKGs/Labs/Other Studies Reviewed:    The following studies were reviewed:  Transcranial Doppler 08/14/2019: Summary: A vascular evaluation was performed. The right middle cerebral artery was  studied. An IV was inserted into the patient's right forearm. Verbal  informed consent was obtained.    HITS heard at rest and during Valsalva.  Partial curtain noted during Valsalva.   Positive TCD Bubble study indicative of medium size right to left shunt  _______________  Myoview  03/06/2022:   The study is normal. The study is low risk.   No ST deviation was noted.   LV perfusion is normal. There is no evidence of ischemia. There is no evidence of infarction.   Left ventricular function is normal. Nuclear stress EF: 61 %. The left ventricular ejection fraction is normal (55-65%). End diastolic cavity size is normal. End systolic cavity size is normal.  Prior study not available for comparison.   EKG:  EKG ordered today.   EKG Interpretation Date/Time:  Friday August 01 2024 13:53:16 EDT Ventricular Rate:  58 PR Interval:  154 QRS Duration:  78 QT Interval:  444 QTC Calculation: 435 R Axis:   84  Text Interpretation: Sinus bradycardia When compared with ECG of 13-Jun-2023 10:04, PREVIOUS ECG IS PRESENT No significant change since last tracing Confirmed by Wisdom Seybold [phone]) on 08/01/2024 1:59:40 PM    Recent Labs: 11/28/2023: TSH  1.12 08/01/2024: BUN 19; Creatinine, Ser 0.98; Potassium 4.5; Sodium 140  Recent Lipid Panel    Component Value Date/Time   CHOL 134 09/06/2023 0921   TRIG 148 09/06/2023 0921   HDL 38 (L) 09/06/2023 0921   CHOLHDL 3.5 09/06/2023 0921   CHOLHDL 2.9 05/23/2016 0910   VLDL 46 (H) 05/23/2016 0910   LDLCALC 70 09/06/2023 0921    Physical Exam:    Vital Signs: BP (!) 144/70 (BP Location: Right Arm, Patient Position: Sitting)   Pulse (!) 58   Ht 5' 5 (1.651 m)   Wt 155 lb 6.4 oz (70.5 kg)   SpO2 97%   BMI 25.86 kg/m     Wt Readings from Last 3 Encounters:  08/01/24 155 lb 6.4 oz (70.5 kg)  07/25/24 151 lb (68.5 kg)  03/17/24 156 lb (70.8 kg)     General: 68 y.o. female in no acute distress. HEENT: Normocephalic and atraumatic. Sclera clear.  Neck: Supple. No carotid bruits. No JVD. Heart: RRR. No murmurs, gallops, or rubs.  Lungs: No increased work of breathing. Clear to ausculation bilaterally. No wheezes, rhonchi, or rales.  Extremities: No lower extremity edema.  Skin: Warm and dry. Neuro: No focal deficits. Psych: Normal affect. Responds appropriately.   Assessment:    1. Precordial pain   2. Paroxysmal atrial tachycardia   3. PFO (patent foramen ovale)   4. Atherosclerosis of both carotid arteries   5. Elevated BP without diagnosis of hypertension   6. Pure hypercholesterolemia   7. Obstructive sleep apnea     Plan:    Chest Pain She describes rare episodes of chest pressure a couple of times a years. Last episode was last week and occurred while she was doing routine activities around the house and lasted for a couple of hours.  - EKG today shows no acute ischemic changes.  - Will order a coronary CTA. Heart rate in the 50s today so will not give an Lopressor  (already on Toprol -XL). Will check BMET today.   Paroxysmal Atrial Tachycardia History of PAT.  - Stable. No significant palpitations.  - Continue Toprol -XL 25mg  daily.   PFO Noted on transcranial  dopplers in 2020. This was discussed with Dr. Wonda at that time and no indication for closure.  - Continue Aspirin  81mg  daily.   Bilateral Carotid Atherosclerosis Noted on recent head imaging.  - No carotid bruits noted on exam.  - Already on aspirin  and statin.  - She is very concerned about this given family history of stroke. Will order bilateral carotid dopplers.   Elevated BP without Formal Diagnosis of Hypertension BP initially 150/80 in the office and then 144/70 on my personal recheck at the end of the visit. She does not routine monitor her BP at home.  - Asked patient to keep a BP/ HR log for 1-2 weeks and send me a MyChart message with the results.   Hyperlipidemia Lipid panel in 08/2023: Total Cholesterol 134, Triglycerides 148, HDL 38, LDL  70.  - Continue Lipitor 10mg  daily.   Obstructive Sleep Apnea - Continue CPAP.  Disposition: Follow up in 3 months.    Signed, Aline FORBES Door, PA-C  08/02/2024 8:52 AM    [facility]

## 2024-08-01 ENCOUNTER — Ambulatory Visit: Attending: Student | Admitting: Student

## 2024-08-01 VITALS — BP 144/70 | HR 58 | Ht 65.0 in | Wt 155.4 lb

## 2024-08-01 DIAGNOSIS — I700 Atherosclerosis of aorta: Secondary | ICD-10-CM

## 2024-08-01 DIAGNOSIS — I4719 Other supraventricular tachycardia: Secondary | ICD-10-CM | POA: Diagnosis not present

## 2024-08-01 DIAGNOSIS — I6523 Occlusion and stenosis of bilateral carotid arteries: Secondary | ICD-10-CM | POA: Insufficient documentation

## 2024-08-01 DIAGNOSIS — R072 Precordial pain: Secondary | ICD-10-CM | POA: Diagnosis not present

## 2024-08-01 DIAGNOSIS — Q2112 Patent foramen ovale: Secondary | ICD-10-CM | POA: Diagnosis not present

## 2024-08-01 DIAGNOSIS — E7800 Pure hypercholesterolemia, unspecified: Secondary | ICD-10-CM | POA: Diagnosis not present

## 2024-08-01 DIAGNOSIS — R030 Elevated blood-pressure reading, without diagnosis of hypertension: Secondary | ICD-10-CM | POA: Insufficient documentation

## 2024-08-01 DIAGNOSIS — G4733 Obstructive sleep apnea (adult) (pediatric): Secondary | ICD-10-CM | POA: Diagnosis not present

## 2024-08-01 NOTE — Patient Instructions (Signed)
 Thank you for choosing [facility]!     Medication Instructions:  No medication changes were made during today's visit.  *If you need a refill on your cardiac medications before your next appointment, please call your pharmacy*   Lab Work: BMET If you have labs (blood work) drawn today and your tests are completely normal, you will receive your results only by: MyChart Message (if you have MyChart) OR A paper copy in the mail If you have any lab test that is abnormal or we need to change your treatment, we will call you to review the results.   Testing/Procedures:   Your cardiac CT will be scheduled at one of the below locations:   [facility] [address] [place], [place] [address] [phone] (Severe contrast allergies only)  OR   [facility] [address] [place], [place] [address] [phone]  OR   [facility] [address] [place], [place] [address] [phone]  OR   [facility] [address]  [place], [place] [address] [phone]  OR   [facility] [address] [place], [place] [phone]  If scheduled at [facility], please arrive at the [facility] and Children's Entrance (Entrance C2) of [facility] 30 minutes prior to test start time. You can use the FREE valet parking offered at entrance C (encouraged to control the heart rate for the test)  Proceed to the [facility] Radiology Department (first floor) to check-in and test prep.  All radiology patients and guests should use entrance C2 at [facility], accessed from [facility], even though the hospital's physical address listed is [address].  If scheduled at the Heart and Vascular Tower at [address], please enter the parking lot using the [address] entrance and use the FREE valet service at the patient drop-off area. Enter the  building and check-in with registration on the main floor.  If scheduled at [facility], please arrive to the Heart and Vascular Center 15 mins early for check-in and test prep.  There is spacious parking and easy access to the radiology department from the [facility] Heart and Vascular entrance. Please enter here and check-in with the desk attendant.   If scheduled at [facility], please arrive 30 minutes early for check-in and test prep.  Please follow these instructions carefully (unless otherwise directed):  An IV will be required for this test and Nitroglycerin  will be given.  Hold all erectile dysfunction medications at least 3 days (72 hrs) prior to test. (Ie viagra, cialis, sildenafil, tadalafil, etc)   On the Night Before the Test: Be sure to Drink plenty of water . Do not consume any caffeinated/decaffeinated beverages or chocolate 12 hours prior to your test. Do not take any antihistamines 12 hours prior to your test.  If the patient has contrast allergy: Patient will need a prescription for Prednisone  and very clear instructions (as follows): Prednisone  50 mg - take 13 hours prior to test Take another Prednisone  50 mg 7 hours prior to test Take another Prednisone  50 mg 1 hour prior to test Take Benadryl  50 mg 1 hour prior to test Patient must complete all four doses of above prophylactic medications. Patient will need a ride after test due to Benadryl .  On the Day of the Test: Drink plenty of water  until 1 hour prior to the test. Do not eat any food  1 hour prior to test. You may take your regular medications prior to the test.  Take metoprolol  (Lopressor ) two hours prior to test. If you take Furosemide /Hydrochlorothiazide/Spironolactone/Chlorthalidone, please HOLD on the morning of the test. Patients who wear a continuous glucose monitor MUST remove the device prior to scanning. FEMALES- please wear underwire-free bra if available, avoid dresses &  tight clothing       After the Test: Drink plenty of water . After receiving IV contrast, you may experience a mild flushed feeling. This is normal. On occasion, you may experience a mild rash up to 24 hours after the test. This is not dangerous. If this occurs, you can take Benadryl  25 mg, Zyrtec, Claritin , or Allegra and increase your fluid intake. (Patients taking Tikosyn should avoid Benadryl , and may take Zyrtec, Claritin , or Allegra) If you experience trouble breathing, this can be serious. If it is severe call 911 IMMEDIATELY. If it is mild, please call our office.  We will call to schedule your test 2-4 weeks out understanding that some insurance companies will need an authorization prior to the service being performed.   For more information and frequently asked questions, please visit our website : [email or web address]  For non-scheduling related questions, please contact the cardiac imaging nurse navigator should you have any questions/concerns: Cardiac Imaging Nurse Navigators Direct Office Dial: [phone]   For scheduling needs, including cancellations and rescheduling, please call [place], [phone].   Your physician has requested that you have a carotid duplex. This test is an ultrasound of the carotid arteries in your neck. It looks at blood flow through these arteries that supply the brain with blood. Allow one hour for this exam. There are no restrictions or special instructions.   Your next appointment:   3 month(s)   Provider:   Aline Door, PA-C           Follow-Up: At [facility], you and your health needs are our priority.  As part of our continuing mission to provide you with exceptional heart care, we have created designated Provider Care Teams.  These Care Teams include your primary Cardiologist (physician) and Advanced Practice Providers (APPs -  Physician Assistants and Nurse Practitioners) who all work together to provide you  with the care you need, when you need it. We recommend signing up for the patient portal called MyChart.  Sign up information is provided on this After Visit Summary.  MyChart is used to connect with patients for Virtual Visits (Telemedicine).  Patients are able to view lab/test results, encounter notes, upcoming appointments, etc.  Non-urgent messages can be sent to your provider as well.   To learn more about what you can do with MyChart, go to [email or web address].

## 2024-08-02 ENCOUNTER — Encounter: Payer: Self-pay | Admitting: Student

## 2024-08-02 LAB — BASIC METABOLIC PANEL WITH GFR
BUN/Creatinine Ratio: 19 (ref 12–28)
BUN: 19 mg/dL (ref 8–27)
CO2: 23 mmol/L (ref 20–29)
Calcium: 9.7 mg/dL (ref 8.7–10.3)
Chloride: 102 mmol/L (ref 96–106)
Creatinine, Ser: 0.98 mg/dL (ref 0.57–1.00)
Glucose: 90 mg/dL (ref 70–99)
Potassium: 4.5 mmol/L (ref 3.5–5.2)
Sodium: 140 mmol/L (ref 134–144)
eGFR: 62 mL/min/1.73 (ref >59)

## 2024-08-03 ENCOUNTER — Ambulatory Visit: Payer: Self-pay | Admitting: Student

## 2024-08-08 ENCOUNTER — Other Ambulatory Visit: Payer: No Typology Code available for payment source

## 2024-08-08 DIAGNOSIS — L501 Idiopathic urticaria: Secondary | ICD-10-CM | POA: Diagnosis not present

## 2024-08-11 ENCOUNTER — Encounter (HOSPITAL_COMMUNITY): Payer: Self-pay

## 2024-08-12 ENCOUNTER — Ambulatory Visit (HOSPITAL_COMMUNITY)
Admission: RE | Admit: 2024-08-12 | Discharge: 2024-08-12 | Disposition: A | Discharge status: Home / Self Care | Source: Ambulatory Visit | Attending: Student | Admitting: Student

## 2024-08-12 DIAGNOSIS — R072 Precordial pain: Secondary | ICD-10-CM

## 2024-08-12 DIAGNOSIS — I2510 Atherosclerotic heart disease of native coronary artery without angina pectoris: Secondary | ICD-10-CM | POA: Insufficient documentation

## 2024-08-12 MED ORDER — NITROGLYCERIN 0.4 MG SL SUBL
0.8000 mg | SUBLINGUAL_TABLET | Freq: Once | SUBLINGUAL | Status: AC
  Administered 2024-08-12: 0.8 mg via SUBLINGUAL

## 2024-08-12 MED ORDER — IOHEXOL 350 MG/ML SOLN
100.0000 mL | Freq: Once | INTRAVENOUS | Status: AC | PRN
  Administered 2024-08-12: 100 mL via INTRAVENOUS

## 2024-08-15 ENCOUNTER — Ambulatory Visit (HOSPITAL_COMMUNITY)
Admission: RE | Admit: 2024-08-15 | Discharge: 2024-08-15 | Disposition: A | Discharge status: Home / Self Care | Source: Ambulatory Visit | Attending: Student | Admitting: Student

## 2024-08-15 DIAGNOSIS — I6523 Occlusion and stenosis of bilateral carotid arteries: Secondary | ICD-10-CM | POA: Insufficient documentation

## 2024-08-25 NOTE — Progress Notes (Unsigned)
 BH MD/PA/NP OP Progress Note  08/29/2024 10:33 AM RHIANNE SOMAN  MRN:  [id number]  Visit Diagnosis:    ICD-10-CM   1. Encounter for long-term (current) use of medications  Z79.899 Hemoglobin A1c    Lipid panel    2. GAD (generalized anxiety disorder)  F41.1 FLUoxetine  (PROZAC ) 40 MG capsule    3. Recurrent major depressive disorder, in remission  F33.40       Assessment: WAYNE BRUNKER is a 68 y.o. female with a history of MDD, GAD, fibromyalgia, Hypothyroidism who presented to [facility] for initial evaluation on 03/04/2024.    At initial evaluation patient reported symptoms of anxiety including excessive worry, fears something awful happening, difficulty relaxing, increased irritability, and feelings of being on edge.  Symptoms are not constant and occur secondary to triggers.  That said they are significantly impacting her day-to-day life.  Patient has a past history of depression/chronic fatigue syndrome.  Symptoms have been well managed with medication and she denies any significant depressive symptoms at initial evaluation.  The patient does have a past trauma history she denies symptoms consistent with PTSD diagnosis.  Patient met criteria for GAD and MDD in remission.  Grayce DELENA Pride presents for follow-up evaluation. Today, 08/29/24, patient has been experiencing gradual increase in depression over the past 6 weeks.  Some of this may be related to grief response by loss of her pet symptoms had been present before this.  She is endorsed increased amotivation and fatigue.  Will titrate Prozac  to 40 mg and reviewed the risk and benefits.  There had been some concern of sexual side effects however on clarification these began prior to starting the Prozac .  Will continue on the remainder of her current regimen and follow-up in 3 months.  Will order metabolic labs.   Psychotherapeutic interventions were used during today's session. From 10:07 AM to 10:25 AM.  Therapeutic interventions included empathic listening and supportive therapy. Used supportive interviewing techniques to provide emotional validation. Improvement was evidenced by patient's participation and identified commitment to therapy goals.    Risk Assessment: An assessment of suicide and violence risk factors was performed as part of this evaluation and is not significantly changed from the last visit. While future psychiatric events cannot be accurately predicted, the patient does not currently require acute inpatient psychiatric care and does not currently meet [place]  involuntary commitment criteria. Patient was given contact information for crisis resources, behavioral health clinic and was instructed to call 911 for emergencies.   Plan: # GAD/MDD Past medication trials: Duloxetine  (lack of benefit), Prozac , Paxil, Effexor  (headaches, nausea, constipation,dizziness), Celexa  (hallucinations, dizziness) Xanax  (sedation) Status of problem: Ongoing Interventions: - Continue Bupropion  XL 450 mg daily (initially prescribed for chronic fatigue syndrome) - Increase Prozac  to 40 mg daily - Continue Abilify  2 mg daily - CMP, CBC, Lipid panel, A1c reviewed  - Repeat in November of 2025  Chief Complaint:  Chief Complaint  Patient presents with   Follow-up   HPI: Ingrid presents reporting that she is okay, a bit more down.  She had put her dog down a couple weeks ago as it was 14 and struggling with medical issues.  It was a tough decision but Kellyn felt that her dog has been really suffering.  She has had some more grief since putting the dog down and has been particularly tough.  Empathic listening techniques were used and support was provided.  Reminded patient that it is appropriate to grieve  the loss of a pet or loved 1.  Anaissa also noticed that she has been having an insidious onset of decreased motivation for the past month or 2.  She does not recognize when it first started but  gradually notes that she is behaving less like her baseline.  She is typically very busy completing tasks, doing chores, or things she enjoys.  Lately however there has been a noticeable lack of interest in doing any of these things.  Given ongoing grief and amotivation we did express concern about a resurgence of depressive symptoms.  Discussed options to help manage this including medication titration and therapy.  Patient was interested in titrating Prozac  and risk and benefits reviewed.  We did discuss prior mention sexual side effects and patient acknowledges that these were present before starting Prozac .  In regards to therapy patient opted to hold off at this time.  Past Psychiatric History:  Past psychiatric diagnoses: MDD and GAD Psychiatric hospitalizations:No hospitalizations Past suicide attempts: Denies Hx of self harm: Denies Hx of violence towards others: None Prior psychiatric providers: No prior psychiatric providers Prior therapy: Had done it around 40 years ago, after Access to firearms: No  Prior medication trials: Paxil, Prozac , Effexor  (he headaches, nausea, constipation), Celexa  (hallucinations), Ambien (sleepwalking, vivid dreams)   Substance use: Alcohol a glass a month. Used marijuana college a couple times in college. Denies any other substance use hx,    Past Medical History:  Past Medical History:  Diagnosis Date   Asthma    Back pain    Chronic fatigue syndrome    Dr. Cleotilde   Complication of anesthesia    Cough    /SOB, PFT's nl, improved with Bronchodilation 8/11   Dysrhythmia    SVT   Endometrial cancer (HCC)    Esophageal spasm    GERD Dr. Cleotilde, Dr. Vicci; egd 2006 nl; UGI 3/13 Small HH, moderate GERD, nl motility; seen at [facility] ([place] egd? neg, sone improvement on sucralfate susp   Fibromyalgia    Foot pain    Dr. Terrie   Hashimoto's thyroiditis    High triglycerides    Hypothyroidism    Dr. Melodee 4/14   Leukocytoclastic  vasculitis (HCC)    Dr. Helga   Low HDL (under 40)    Mitral valve prolapse    Paroxysmal atrial tachycardia    Dr. Jeffrie   Patent foramen ovale    Pernicious anemia    Dr. Cleotilde   PONV (postoperative nausea and vomiting)    along time ago had nausea ans vomiting after surgery-25 years ago   Reflux    ? delayed gastric emptying--GES nl 01/2012 (6% at 2hrs)   RLS (restless legs syndrome)    low ferritin Dr. Cleotilde   Rosacea    Sleep apnea    Uterine carcinoma (HCC)    Dr. Arlee    Past Surgical History:  Procedure Laterality Date   ABDOMINAL EXPOSURE N/A 12/07/2021   Procedure: ABDOMINAL EXPOSURE;  Surgeon: Gretta Lonni PARAS, MD;  Location: [facility] OR;  Service: Vascular;  Laterality: N/A;   ABDOMINAL HYSTERECTOMY     bladder tack     x 2   BUNIONECTOMY     CALCANEAL OSTEOTOMY Right 01/04/2023   Procedure: CALCANEAL OSTEOTOMY;  Surgeon: Kit Rush, MD;  Location: [facility];  Service: Orthopedics;  Laterality: Right;   CHOLECYSTECTOMY  2009   COLONOSCOPY     scr, 12/2008 ([facility]) nl   COLOSTOMY Left 06/05/2018   Procedure:  COLOSTOMY;  Surgeon: Rubin Calamity, MD;  Location: [facility] OR;  Service: General;  Laterality: Left;   COLOSTOMY REVERSAL N/A 10/16/2018   Procedure: HARTMANN'S COLOSTOMY REVERSAL, RIGID PROCTOSCOPY;  Surgeon: Rubin Calamity, MD;  Location: [facility] ORS;  Service: General;  Laterality: N/A;   GASTROCNEMIUS RECESSION Right 01/04/2023   Procedure: GASTROCNEMIUS recession;  Surgeon: Kit Rush, MD;  Location: [facility];  Service: Orthopedics;  Laterality: Right;   HAMMER TOE SURGERY     HYSTERECTOMY ABDOMINAL WITH SALPINGO-OOPHORECTOMY  2008   INCISIONAL HERNIA REPAIR  04/09/2019   x2   INCISIONAL HERNIA REPAIR N/A 04/09/2019   Procedure: INCISIONAL HERNIA REPAIR WITH MESH;  Surgeon: Rubin Calamity, MD;  Location: [facility] OR;  Service: General;  Laterality: N/A;   LAPAROSCOPY N/A 10/16/2018   Procedure: LAPAROSCOPY DIAGNOSTIC WITH LYSIS OF  ADHESIONS;  Surgeon: Rubin Calamity, MD;  Location: [facility]S;  Service: General;  Laterality: N/A;   LUNG BIOPSY     L Lower lung pulm nodules, largest 4.31mm, low risk, repeat CT in 1 yr now following with pulm [facility]; 9/13 Stable tiny lung nodules, no f/u needed   LYMPH NODE DISSECTION  2008   LYSIS OF ADHESION N/A 04/09/2019   Procedure: OPEN LYSIS OF ADHESION;  Surgeon: Rubin Calamity, MD;  Location: [facility] OR;  Service: General;  Laterality: N/A;   METATARSAL OSTEOTOMY     NECK SURGERY     x 2   OBLIQUE LUMBAR INTERBODY FUSION 1 LEVEL WITH PERCUTANEOUS SCREWS N/A 12/07/2021   Procedure: OBLIQUE LUMBAR INTERBODY FUSION 1 LEVEL WITH PERCUTANEOUS SCREWS (OLIF L4-5 WITH POSTERIOR SPINAL FUSION AND PEDICLE SCREWS);  Surgeon: Burnetta Aures, MD;  Location: [facility] OR;  Service: Orthopedics;  Laterality: N/A;  4 HRS DR. CLARK TO DO APPROACH LEFT TAP BLOCK WITH EXPAREL  3 C-BED   PARTIAL COLECTOMY N/A 06/05/2018   Procedure: EXPLORATORY LAPAROTOMY AND PARTIAL COLECTOMY;  Surgeon: Rubin Calamity, MD;  Location: [facility] OR;  Service: General;  Laterality: N/A;   POSTERIOR TIBIAL TENDON REPAIR Right 01/04/2023   Procedure: POSTERIOR TIBIAL TENDON TENOLYSIS AND  flexor digitorum longus TRANSFER TO NAVICULAR;  Surgeon: Kit Rush, MD;  Location: [facility];  Service: Orthopedics;  Laterality: Right;   TONSILLECTOMY     US  ECHOCARDIOGRAPHY     with Nuclear test, Dr. Jeffrie, Low risk   VESICOVAGINAL FISTULA CLOSURE W/ TAH     Family History:  Family History  Problem Relation Age of Onset   Hashimoto's thyroiditis Mother    High blood pressure Mother    Stroke Mother    Other Mother        Borderline DM   Alzheimer's disease Mother    Ulcerative colitis Father    Meniere's disease Father    Hearing loss Father    Stroke Father    Stroke Sister     Social History:  Social History   Socioeconomic History   Marital status: Married    Spouse name: Not on file   Number of children: 2    Years of education: 16   Highest education level: Not on file  Occupational History   Not on file  Tobacco Use   Smoking status: Never   Smokeless tobacco: Never  Vaping Use   Vaping status: Never Used  Substance and Sexual Activity   Alcohol use: Yes    Comment: maybe a glass of wine/month   Drug use: Never   Sexual activity: Not on file  Other Topics Concern   Not  on file  Social History Narrative   Diet: High calorie/ high fat      Caffeine: Yes      Married, if yes what year: Married, 1986      Do you live in a house, apartment, assisted living, condo, trailer, ect: House      Is it one or more stories: One story      How many persons live in your home? 2      Pets: 2      Highest level or education completed: completed college, some graduate school      Current/Past profession: Retail-Division management      Exercise: Rarely                 Type and how often: Not often- experience PEM         Living Will:    DNR:   POA/HPOA:      Functional Status:   Do you have difficulty bathing or dressing yourself? No   Do you have difficulty preparing food or eating?No   Do you have difficulty managing your medications?No   Do you have difficulty managing your finances?No   Do you have difficulty affording your medications?No   Drinks caffeine coke products every other day   One floor home   Right handed   Social Drivers of Health   Financial Resource Strain: Not on file  Food Insecurity: No Food Insecurity (06/18/2023)   Hunger Vital Sign    Worried About Running Out of Food in the Last Year: Never true    Ran Out of Food in the Last Year: Never true  Transportation Needs: No Transportation Needs (06/18/2023)   PRAPARE - Administrator, Civil Service (Medical): No    Lack of Transportation (Non-Medical): No  Physical Activity: Not on file  Stress: Not on file  Social Connections: Not on file    Allergies:  Allergies  Allergen Reactions    Dorethia Parisian ] Anaphylaxis    325mg  or higher Pt ok with 81mg  daily dose   Bee Venom Shortness Of Breath, Itching, Swelling and Hives   Peanut-Containing Drug Products Anaphylaxis and Other (See Comments)   Ambien [Zolpidem Tartrate] Other (See Comments)    Hallucinations   Tape Itching, Swelling and Rash   Cipro  [Ciprofloxacin  Hcl] Nausea And Vomiting   Latex Rash   Minocycline Rash   Shellfish Allergy Rash    Current Medications: Current Outpatient Medications  Medication Sig Dispense Refill   acetaminophen  (TYLENOL ) 500 MG tablet Take 2 tablets (1,000 mg total) by mouth every 6 (six) hours as needed. 30 tablet 0   albuterol  (PROVENTIL  HFA;VENTOLIN  HFA) 108 (90 Base) MCG/ACT inhaler Inhale 2 puffs into the lungs every 4 (four) hours as needed for wheezing or shortness of breath.     ARIPiprazole  (ABILIFY ) 2 MG tablet Take 1 tablet (2 mg total) by mouth daily. 90 tablet 3   Ascorbic Acid (VITAMIN C PO) Take 1 tablet by mouth daily.     aspirin  EC 81 MG tablet Take 81 mg by mouth at bedtime.     atorvastatin  (LIPITOR) 10 MG tablet TAKE 1 TABLET BY MOUTH DAILY 90 tablet 3   buPROPion  (WELLBUTRIN  XL) 150 MG 24 hr tablet TAKE 3 TABLETS BY MOUTH DAILY 270 tablet 1   Cholecalciferol  (VITAMIN D -3 PO) Take 1 capsule by mouth daily.     cyanocobalamin  (VITAMIN B12) 1000 MCG/ML injection Inject 1 mL (1,000 mcg total) into the muscle every  30 (thirty) days. 1 mL 3   docusate sodium  (COLACE) 100 MG capsule Take 1 capsule (100 mg total) by mouth 2 (two) times daily. While taking narcotic pain medicine. 30 capsule 0   EPINEPHrine  0.3 mg/0.3 mL IJ SOAJ injection Inject 0.3 mg into the muscle as needed for anaphylaxis.     estradiol  (ESTRACE ) 0.1 MG/GM vaginal cream Place vaginally 3 (three) times a week.     FLUoxetine  (PROZAC ) 40 MG capsule Take 1 capsule (40 mg total) by mouth daily. 90 capsule 0   ipratropium (ATROVENT ) 0.06 % nasal spray Place 2 sprays into both nostrils 2 (two) times daily as  needed for rhinitis.     ketotifen  (ZADITOR ) 0.025 % ophthalmic solution Place 1 drop into both eyes daily.     levothyroxine  (SYNTHROID ) 150 MCG tablet Take 150 mcg by mouth daily.     metoprolol  succinate (TOPROL -XL) 25 MG 24 hr tablet Take 1 tablet (25 mg total) by mouth daily. Pt needs to schedule appt with provider for further refills 30 tablet 1   naproxen sodium (ALEVE) 220 MG tablet Take 220 mg by mouth daily as needed (general pain, headache).     nitroGLYCERIN  (NITROSTAT ) 0.4 MG SL tablet Place 1 tablet (0.4 mg total) under the tongue every 5 (five) minutes as needed for chest pain. 25 tablet 3   omalizumab  (XOLAIR ) 150 MG injection 300 mg every 30 (thirty) days. Every 29th or 30th     Oyster Shell Calcium  500 MG TABS daily at 6 (six) AM.     polyethylene glycol (MIRALAX  / GLYCOLAX ) 17 g packet Take 17 g by mouth daily as needed (constipation).     pramipexole  (MIRAPEX ) 0.125 MG tablet Take 3 tablets (0.375 mg total) by mouth at bedtime as needed (restless legs syndrome). 270 tablet 1   TRELEGY ELLIPTA 200-62.5-25 MCG/ACT AEPB Inhale 1 puff into the lungs at bedtime.     No current facility-administered medications for this visit.     Psychiatric Specialty Exam: There were no vitals taken for this visit.There is no height or weight on file to calculate BMI. Review of Systems  General Appearance: Well Groomed  Eye Contact:  Good  Speech:  Clear and Coherent  Volume:  Normal  Mood:  dysthymic  Affect:  Appropriate  Thought Content: Logical   Suicidal Thoughts:  No  Homicidal Thoughts:  No  Thought Process:  Coherent and Goal Directed  Orientation:  Full (Time, Place, and Person)    Memory: Immediate;   Good  Judgment:  Good  Insight:  Fair  Concentration:  Concentration: Good  Recall:  not formally assessed   Fund of Knowledge: Good  Language: Good  Psychomotor Activity:  Normal  Akathisia:  No  AIMS (if indicated): not done  Assets:  Communication Skills Desire for  Improvement Financial Resources/Insurance Housing  ADL's:  Intact  Cognition: WNL  Sleep:  Good   Metabolic Disorder Labs: Lab Results  Component Value Date   HGBA1C 5.0 06/13/2023   MPG 96.8 06/13/2023   MPG 134 08/16/2022   No results found for: PROLACTIN Lab Results  Component Value Date   CHOL 134 09/06/2023   TRIG 148 09/06/2023   HDL 38 (L) 09/06/2023   CHOLHDL 3.5 09/06/2023   VLDL 46 (H) 05/23/2016   LDLCALC 70 09/06/2023   LDLCALC 40 05/23/2016   Lab Results  Component Value Date   TSH 1.12 11/28/2023    Therapeutic Level Labs: No results found for: LITHIUM No results found  for: VALPROATE No results found for: CBMZ   Screenings: GAD-7    Flowsheet Row Office Visit from [facility]/04/2024 in [facility] Video Visit from 12/26/2023 in [facility] Office Visit from 11/28/2023 in [facility]  Total GAD-7 Score 9 16 19    Mini-Mental    Flowsheet Row Office Visit from 12/17/2023 in [facility] Office Visit from 07/22/2019 in [facility]  Total Score (max 30 points ) 30 29   PHQ2-9    Flowsheet Row Office Visit from 03/04/2024 in [facility] Video Visit from 01/16/2024 in [facility] Video Visit from 12/26/2023 in [facility] Office Visit from 12/17/2023 in [facility] Office Visit from 11/28/2023 in [facility]  PHQ-2 Total Score 1 3 2  0 5  PHQ-9 Total Score -- 8 6 -- 12   Flowsheet Row ED from 07/25/2024 in [facility] at [facility] Office Visit from 03/04/2024 in [facility] ED from 09/28/2023 in [facility] Emergency Department at  [facility]  C-SSRS RISK CATEGORY No Risk No Risk No Risk    Collaboration of Care: Collaboration of Care: Medication Management AEB medication prescription and Other provider involved in patient's care AEB ED, cardiology, and neurology chart review  Patient/Guardian was advised Release of Information must be obtained prior to any record release in order to collaborate their care with an outside provider. Patient/Guardian was advised if they have not already done so to contact the registration department to sign all necessary forms in order for us  to release information regarding their care.   Consent: Patient/Guardian gives verbal consent for treatment and assignment of benefits for services provided during this visit. Patient/Guardian expressed understanding and agreed to proceed.    Arvella CHRISTELLA Finder, MD 08/29/2024, 10:33 AM   Virtual Visit via Video Note  I connected with Grayce Pride on 08/29/24 at 10:00 AM EDT by a video enabled telemedicine application and verified that I am speaking with the correct person using two identifiers.  Location: Patient: Home Provider: Home Office   I discussed the limitations of evaluation and management by telemedicine and the availability of in person appointments. The patient expressed understanding and agreed to proceed.   I discussed the assessment and treatment plan with the patient. The patient was provided an opportunity to ask questions and all were answered. The patient agreed with the plan and demonstrated an understanding of the instructions.   The patient was advised to call back or seek an in-person evaluation if the symptoms worsen or if the condition fails to improve as anticipated.  I provided 20 minutes of non-face-to-face time during this encounter.   Arvella CHRISTELLA Finder, MD

## 2024-08-29 ENCOUNTER — Encounter (HOSPITAL_COMMUNITY): Payer: Self-pay | Admitting: Psychiatry

## 2024-08-29 ENCOUNTER — Telehealth (HOSPITAL_COMMUNITY): Admitting: Psychiatry

## 2024-08-29 DIAGNOSIS — F411 Generalized anxiety disorder: Secondary | ICD-10-CM

## 2024-08-29 DIAGNOSIS — F3340 Major depressive disorder, recurrent, in remission, unspecified: Secondary | ICD-10-CM

## 2024-08-29 DIAGNOSIS — Z79899 Other long term (current) drug therapy: Secondary | ICD-10-CM | POA: Diagnosis not present

## 2024-08-29 DIAGNOSIS — Z5181 Encounter for therapeutic drug level monitoring: Secondary | ICD-10-CM

## 2024-08-29 MED ORDER — FLUOXETINE HCL 40 MG PO CAPS: 40.0000 mg | ORAL_CAPSULE | Freq: Every day | ORAL | 0 refills | Status: DC

## 2024-09-02 DIAGNOSIS — Z23 Encounter for immunization: Secondary | ICD-10-CM | POA: Diagnosis not present

## 2024-09-08 DIAGNOSIS — L501 Idiopathic urticaria: Secondary | ICD-10-CM | POA: Diagnosis not present

## 2024-09-18 DIAGNOSIS — J4540 Moderate persistent asthma, uncomplicated: Secondary | ICD-10-CM | POA: Diagnosis not present

## 2024-09-18 DIAGNOSIS — Z91010 Allergy to peanuts: Secondary | ICD-10-CM | POA: Diagnosis not present

## 2024-09-18 DIAGNOSIS — J3089 Other allergic rhinitis: Secondary | ICD-10-CM | POA: Diagnosis not present

## 2024-09-18 DIAGNOSIS — J3081 Allergic rhinitis due to animal (cat) (dog) hair and dander: Secondary | ICD-10-CM | POA: Diagnosis not present

## 2024-09-22 ENCOUNTER — Other Ambulatory Visit: Payer: Self-pay | Admitting: Cardiology

## 2024-09-22 DIAGNOSIS — L719 Rosacea, unspecified: Principal | ICD-10-CM

## 2024-09-22 MED ORDER — METRONIDAZOLE 0.75 % TOPICAL GEL
TOPICAL | 5 refills | 0.00000 days | Status: CP
Start: 2024-09-22 — End: ?

## 2024-09-22 MED ORDER — DOXYCYCLINE HYCLATE 50 MG CAPSULE
ORAL_CAPSULE | Freq: Every day | ORAL | 5 refills | 30.00000 days | Status: CP
Start: 2024-09-22 — End: ?

## 2024-09-22 MED ORDER — DOXYCYCLINE HYCLATE 100 MG TABLET
Freq: Two times a day (BID) | ORAL | 0.00000 days | Status: CN
Start: 2024-09-22 — End: ?

## 2024-10-03 ENCOUNTER — Telehealth: Payer: Self-pay | Admitting: Cardiology

## 2024-10-03 NOTE — Telephone Encounter (Signed)
*  STAT* If patient is at the pharmacy, call can be transferred to refill team.   1. Which medications need to be refilled? (please list name of each medication and dose if known)  metoprolol succinate (TOPROL-XL) 25 MG 24 hr tablet   2. Which pharmacy/location (including street and city if local pharmacy) is medication to be sent to? [facility] [place] - [address]   3. Do they need a 30 day or 90 day supply? 90 day

## 2024-10-03 NOTE — Telephone Encounter (Signed)
 Returned call to pt.  She  has been made aware that her Metoprolol  was sent to [facility] on 09/29/24.  She verbalized understanding and thanked me for the call.

## 2024-10-08 DIAGNOSIS — L501 Idiopathic urticaria: Secondary | ICD-10-CM | POA: Diagnosis not present

## 2024-11-03 NOTE — Progress Notes (Signed)
 BH MD/PA/NP OP Progress Note  11/06/2024 1:54 PM NATASHIA ROSEMAN  MRN:  [id number]  Visit Diagnosis:    ICD-10-CM   1. GAD (generalized anxiety disorder)  F41.1 FLUoxetine  (PROZAC ) 40 MG capsule    2. Encounter for long-term (current) use of medications  Z79.899 Hemoglobin A1c    Lipid panel    3. Recurrent major depressive disorder, in remission  F33.40        Assessment: VENISHA BOEHNING is a 69 y.o. female with a history of MDD, GAD, fibromyalgia, Hypothyroidism who presented to [facility] for initial evaluation on 03/04/2024.    At initial evaluation patient reported symptoms of anxiety including excessive worry, fears something awful happening, difficulty relaxing, increased irritability, and feelings of being on edge.  Symptoms are not constant and occur secondary to triggers.  That said they are significantly impacting her day-to-day life.  Patient has a past history of depression/chronic fatigue syndrome.  Symptoms have been well managed with medication and she denies any significant depressive symptoms at initial evaluation.  The patient does have a past trauma history she denies symptoms consistent with PTSD diagnosis.  Patient met criteria for GAD and MDD in remission.  Grayce DELENA Pride presents for follow-up evaluation. Today, 11/06/2024, patient had improvement in neurovegetative symptoms of depression with the titration of fluoxetine .  She denies any adverse side effects from the Prozac .  Patient endorsed concern of weight gain though there has been limited change over the last 3 months.  We will continue to monitor and ordered metabolic labs today.  Patient will complete them in the interim.  Will continue on her current regimen and follow-up in 2 months.  If moods remain stable could consider tapering Prozac  back to 20 mg at that time.  Risk Assessment: An assessment of suicide and violence risk factors was performed as part of this evaluation and is not  significantly changed from the last visit. While future psychiatric events cannot be accurately predicted, the patient does not currently require acute inpatient psychiatric care and does not currently meet [place]  involuntary commitment criteria. Patient was given contact information for crisis resources, behavioral health clinic and was instructed to call 911 for emergencies.   Plan: # GAD/MDD Past medication trials: Duloxetine  (lack of benefit), Prozac , Paxil, Effexor  (headaches, nausea, constipation,dizziness), Celexa  (hallucinations, dizziness) Xanax  (sedation) Status of problem: Ongoing Interventions: - Continue Bupropion  XL 450 mg daily (initially prescribed for chronic fatigue syndrome) - Continue Prozac  40 mg daily - Continue Abilify  2 mg daily - CMP, CBC, Lipid panel, A1c reviewed  - Repeat metabolic labs ordered today  Chief Complaint:  Chief Complaint  Patient presents with   Follow-up   HPI: Karem presents reporting that she felt thinks that things have been going better the last 2 months with the Prozac  titration. She does typically have more difficulty with the holidays but this time it was more enjoyable then usual. This was despite the fact that they spent the holiday with her sister who she can have a difficult relationship with due to personality and world view differences.   The motivation has improved and she is back to her baseline. Has noticed that she is keeping up with the chores and things that had previously fallen off. She has been going to work consistently and keeping up with things there as well.  Patient denies any notable side effects from the titration of Prozac .  She does still have some concern about weight gain noting  that she was 154 when she weighed herself this morning.  She was 155 back in October when weighed so the current 154 is likely within normal fluctuations.  We will continue to monitor for any significant changes in weight however.   Patient did not get metabolic blood work in the interim and discussed coming to the office to get it prior to her next appointment.  If moods are stable over the next several months patient is interested in trialing the lower dose of Prozac  again.  Past Psychiatric History:  Past psychiatric diagnoses: MDD and GAD Psychiatric hospitalizations:No hospitalizations Past suicide attempts: Denies Hx of self harm: Denies Hx of violence towards others: None Prior psychiatric providers: No prior psychiatric providers Prior therapy: Had done it around 40 years ago, after Access to firearms: No  Prior medication trials: Paxil, Prozac , Effexor  (he headaches, nausea, constipation), Celexa  (hallucinations), Ambien (sleepwalking, vivid dreams)   Substance use: Alcohol a glass a month. Used marijuana college a couple times in college. Denies any other substance use hx,    Past Medical History:  Past Medical History:  Diagnosis Date   Asthma    Back pain    Chronic fatigue syndrome    Dr. Cleotilde   Complication of anesthesia    Cough    /SOB, PFT's nl, improved with Bronchodilation 8/11   Dysrhythmia    SVT   Endometrial cancer (HCC)    Esophageal spasm    GERD Dr. Cleotilde, Dr. Vicci; egd 2006 nl; UGI 3/13 Small HH, moderate GERD, nl motility; seen at [facility] ([place]), egd? neg, sone improvement on sucralfate susp   Fibromyalgia    Foot pain    Dr. Terrie   Hashimoto's thyroiditis    High triglycerides    Hypothyroidism    Dr. Melodee 4/14   Leukocytoclastic vasculitis (HCC)    Dr. Helga   Low HDL (under 40)    Mitral valve prolapse    Paroxysmal atrial tachycardia    Dr. Jeffrie   Patent foramen ovale    Pernicious anemia    Dr. Cleotilde   PONV (postoperative nausea and vomiting)    along time ago had nausea ans vomiting after surgery-25 years ago   Reflux    ? delayed gastric emptying--GES nl 01/2012 (6% at 2hrs)   RLS (restless legs syndrome)    low ferritin Dr. Cleotilde    Rosacea    Sleep apnea    Uterine carcinoma (HCC)    Dr. Arlee    Past Surgical History:  Procedure Laterality Date   ABDOMINAL EXPOSURE N/A 12/07/2021   Procedure: ABDOMINAL EXPOSURE;  Surgeon: Gretta Lonni PARAS, MD;  Location: [facility] OR;  Service: Vascular;  Laterality: N/A;   ABDOMINAL HYSTERECTOMY     bladder tack     x 2   BUNIONECTOMY     CALCANEAL OSTEOTOMY Right 01/04/2023   Procedure: CALCANEAL OSTEOTOMY;  Surgeon: Kit Rush, MD;  Location: [facility];  Service: Orthopedics;  Laterality: Right;   CHOLECYSTECTOMY  2009   COLONOSCOPY     scr, 12/2008 ([facility]) nl   COLOSTOMY Left 06/05/2018   Procedure: COLOSTOMY;  Surgeon: Rubin Calamity, MD;  Location: [facility] OR;  Service: General;  Laterality: Left;   COLOSTOMY REVERSAL N/A 10/16/2018   Procedure: HARTMANN'S COLOSTOMY REVERSAL, RIGID PROCTOSCOPY;  Surgeon: Rubin Calamity, MD;  Location: [facility] ORS;  Service: General;  Laterality: N/A;   GASTROCNEMIUS RECESSION Right 01/04/2023   Procedure: GASTROCNEMIUS recession;  Surgeon: Kit Rush, MD;  [facility]: [facility];  Service: Orthopedics;  Laterality: Right;   HAMMER TOE SURGERY     HYSTERECTOMY ABDOMINAL WITH SALPINGO-OOPHORECTOMY  2008   INCISIONAL HERNIA REPAIR  04/09/2019   x2   INCISIONAL HERNIA REPAIR N/A 04/09/2019   Procedure: INCISIONAL HERNIA REPAIR WITH MESH;  Surgeon: Rubin Calamity, MD;  Location: [facility] OR;  Service: General;  Laterality: N/A;   LAPAROSCOPY N/A 10/16/2018   Procedure: LAPAROSCOPY DIAGNOSTIC WITH LYSIS OF ADHESIONS;  Surgeon: Rubin Calamity, MD;  Location: [facility]S;  Service: General;  Laterality: N/A;   LUNG BIOPSY     L Lower lung pulm nodules, largest 4.103mm, low risk, repeat CT in 1 yr now following with pulm at [facility]; 9/13 Stable tiny lung nodules, no f/u needed   LYMPH NODE DISSECTION  2008   LYSIS OF ADHESION N/A 04/09/2019   Procedure: OPEN LYSIS OF ADHESION;  Surgeon: Rubin Calamity, MD;  Location: [facility] OR;  Service:  General;  Laterality: N/A;   METATARSAL OSTEOTOMY     NECK SURGERY     x 2   OBLIQUE LUMBAR INTERBODY FUSION 1 LEVEL WITH PERCUTANEOUS SCREWS N/A 12/07/2021   Procedure: OBLIQUE LUMBAR INTERBODY FUSION 1 LEVEL WITH PERCUTANEOUS SCREWS (OLIF L4-5 WITH POSTERIOR SPINAL FUSION AND PEDICLE SCREWS);  Surgeon: Burnetta Aures, MD;  Location: [facility] OR;  Service: Orthopedics;  Laterality: N/A;  4 HRS DR. CLARK TO DO APPROACH LEFT TAP BLOCK WITH EXPAREL  3 C-BED   PARTIAL COLECTOMY N/A 06/05/2018   Procedure: EXPLORATORY LAPAROTOMY AND PARTIAL COLECTOMY;  Surgeon: Rubin Calamity, MD;  Location: [facility] OR;  Service: General;  Laterality: N/A;   POSTERIOR TIBIAL TENDON REPAIR Right 01/04/2023   Procedure: POSTERIOR TIBIAL TENDON TENOLYSIS AND  flexor digitorum longus TRANSFER TO NAVICULAR;  Surgeon: Kit Rush, MD;  Location: [facility];  Service: Orthopedics;  Laterality: Right;   TONSILLECTOMY     US  ECHOCARDIOGRAPHY     with Nuclear test, Dr. Jeffrie, Low risk   VESICOVAGINAL FISTULA CLOSURE W/ TAH     Family History:  Family History  Problem Relation Age of Onset   Hashimoto's thyroiditis Mother    High blood pressure Mother    Stroke Mother    Other Mother        Borderline DM   Alzheimer's disease Mother    Ulcerative colitis Father    Meniere's disease Father    Hearing loss Father    Stroke Father    Stroke Sister     Social History:  Social History   Socioeconomic History   Marital status: Married    Spouse name: Not on file   Number of children: 2   Years of education: 16   Highest education level: Not on file  Occupational History   Not on file  Tobacco Use   Smoking status: Never   Smokeless tobacco: Never  Vaping Use   Vaping status: Never Used  Substance and Sexual Activity   Alcohol use: Yes    Comment: maybe a glass of wine/month   Drug use: Never   Sexual activity: Not on file  Other Topics Concern   Not on file  Social History Narrative   Diet:  High calorie/ high fat      Caffeine: Yes      Married, if yes what year: Married, 1986      Do you live in a house, apartment, assisted living, condo, trailer, ect: House      Is it one or more stories: One insurance claims handler  How many persons live in your home? 2      Pets: 2      Highest level or education completed: completed college, some graduate school      Current/Past profession: Retail-Division management      Exercise: Rarely                 Type and how often: Not often- experience PEM         Living Will:    DNR:   POA/HPOA:      Functional Status:   Do you have difficulty bathing or dressing yourself? No   Do you have difficulty preparing food or eating?No   Do you have difficulty managing your medications?No   Do you have difficulty managing your finances?No   Do you have difficulty affording your medications?No   Drinks caffeine coke products every other day   One floor home   Right handed   Social Drivers of Health   Tobacco Use: Low Risk (11/06/2024)   Patient History    Smoking Tobacco Use: Never    Smokeless Tobacco Use: Never    Passive Exposure: Not on file  Financial Resource Strain: Not on file  Food Insecurity: No Food Insecurity (06/18/2023)   Hunger Vital Sign    Worried About Running Out of Food in the Last Year: Never true    Ran Out of Food in the Last Year: Never true  Transportation Needs: No Transportation Needs (06/18/2023)   PRAPARE - Administrator, Civil Service (Medical): No    Lack of Transportation (Non-Medical): No  Physical Activity: Not on file  Stress: Not on file  Social Connections: Not on file  Depression (PHQ2-9): Low Risk (03/04/2024)   Depression (PHQ2-9)    PHQ-2 Score: 1  Recent Concern: Depression (PHQ2-9) - Medium Risk (01/16/2024)   Depression (PHQ2-9)    PHQ-2 Score: 8  Alcohol Screen: Not on file  Housing: Low Risk (06/18/2023)   Housing    Last Housing Risk Score: 0  Utilities: Not At Risk (06/13/2023)    AHC Utilities    Threatened with loss of utilities: No  Health Literacy: Not on file    Allergies:  Allergies  Allergen Reactions   Dorethia Age ] Anaphylaxis    325mg  or higher Pt ok with 81mg  daily dose   Bee Venom Shortness Of Breath, Itching, Swelling and Hives   Peanut-Containing Drug Products Anaphylaxis and Other (See Comments)   Ambien [Zolpidem Tartrate] Other (See Comments)    Hallucinations   Tape Itching, Swelling and Rash   Cipro  [Ciprofloxacin  Hcl] Nausea And Vomiting   Latex Rash   Minocycline Rash   Shellfish Allergy Rash    Current Medications: Current Outpatient Medications  Medication Sig Dispense Refill   acetaminophen  (TYLENOL ) 500 MG tablet Take 2 tablets (1,000 mg total) by mouth every 6 (six) hours as needed. 30 tablet 0   albuterol  (PROVENTIL  HFA;VENTOLIN  HFA) 108 (90 Base) MCG/ACT inhaler Inhale 2 puffs into the lungs every 4 (four) hours as needed for wheezing or shortness of breath.     ARIPiprazole  (ABILIFY ) 2 MG tablet Take 1 tablet (2 mg total) by mouth daily. 90 tablet 3   Ascorbic Acid (VITAMIN C PO) Take 1 tablet by mouth daily.     aspirin  EC 81 MG tablet Take 81 mg by mouth at bedtime.     atorvastatin  (LIPITOR) 10 MG tablet TAKE 1 TABLET BY MOUTH DAILY 90 tablet 3   buPROPion  (WELLBUTRIN  XL)  150 MG 24 hr tablet TAKE 3 TABLETS BY MOUTH DAILY 270 tablet 1   Cholecalciferol  (VITAMIN D -3 PO) Take 1 capsule by mouth daily.     cyanocobalamin  (VITAMIN B12) 1000 MCG/ML injection Inject 1 mL (1,000 mcg total) into the muscle every 30 (thirty) days. 1 mL 3   docusate sodium  (COLACE) 100 MG capsule Take 1 capsule (100 mg total) by mouth 2 (two) times daily. While taking narcotic pain medicine. 30 capsule 0   EPINEPHrine  0.3 mg/0.3 mL IJ SOAJ injection Inject 0.3 mg into the muscle as needed for anaphylaxis.     estradiol  (ESTRACE ) 0.1 MG/GM vaginal cream Place vaginally 3 (three) times a week.     FLUoxetine  (PROZAC ) 40 MG capsule Take 1 capsule (40  mg total) by mouth daily. 90 capsule 0   ipratropium (ATROVENT ) 0.06 % nasal spray Place 2 sprays into both nostrils 2 (two) times daily as needed for rhinitis.     ketotifen  (ZADITOR ) 0.025 % ophthalmic solution Place 1 drop into both eyes daily.     levothyroxine  (SYNTHROID ) 150 MCG tablet Take 150 mcg by mouth daily.     metoprolol  succinate (TOPROL -XL) 25 MG 24 hr tablet TAKE 1 TABLET BY MOUTH DAILY 90 tablet 3   naproxen sodium (ALEVE) 220 MG tablet Take 220 mg by mouth daily as needed (general pain, headache).     nitroGLYCERIN  (NITROSTAT ) 0.4 MG SL tablet Place 1 tablet (0.4 mg total) under the tongue every 5 (five) minutes as needed for chest pain. 25 tablet 3   omalizumab  (XOLAIR ) 150 MG injection 300 mg every 30 (thirty) days. Every 29th or 30th     Oyster Shell Calcium  500 MG TABS daily at 6 (six) AM.     polyethylene glycol (MIRALAX  / GLYCOLAX ) 17 g packet Take 17 g by mouth daily as needed (constipation).     pramipexole  (MIRAPEX ) 0.125 MG tablet Take 3 tablets (0.375 mg total) by mouth at bedtime as needed (restless legs syndrome). 270 tablet 1   TRELEGY ELLIPTA 200-62.5-25 MCG/ACT AEPB Inhale 1 puff into the lungs at bedtime.     No current facility-administered medications for this visit.     Psychiatric Specialty Exam: There were no vitals taken for this visit.There is no height or weight on file to calculate BMI. Review of Systems  General Appearance: Well Groomed  Eye Contact:  Good  Speech:  Clear and Coherent  Volume:  Normal  Mood:  Euthymic  Affect:  Appropriate  Thought Content: Logical   Suicidal Thoughts:  No  Homicidal Thoughts:  No  Thought Process:  Coherent and Goal Directed  Orientation:  Full (Time, Place, and Person)    Memory: Immediate;   Good  Judgment:  Good  Insight:  Fair  Concentration:  Concentration: Good  Recall:  not formally assessed   Fund of Knowledge: Good  Language: Good  Psychomotor Activity:  Normal  Akathisia:  No  AIMS (if  indicated): not done  Assets:  Communication Skills Desire for Improvement Financial Resources/Insurance Housing  ADL's:  Intact  Cognition: WNL  Sleep:  Good   Metabolic Disorder Labs: Lab Results  Component Value Date   HGBA1C 5.0 06/13/2023   MPG 96.8 06/13/2023   MPG 134 08/16/2022   No results found for: PROLACTIN Lab Results  Component Value Date   CHOL 134 09/06/2023   TRIG 148 09/06/2023   HDL 38 (L) 09/06/2023   CHOLHDL 3.5 09/06/2023   VLDL 46 (H) 05/23/2016   LDLCALC 70  09/06/2023   LDLCALC 40 05/23/2016   Lab Results  Component Value Date   TSH 1.12 11/28/2023    Therapeutic Level Labs: No results found for: LITHIUM No results found for: VALPROATE No results found for: CBMZ   Screenings: GAD-7    Flowsheet Row Office Visit from [facility]/04/2024 in [facility] Video Visit from 12/26/2023 in [facility] Office Visit from 11/28/2023 in [facility]  Total GAD-7 Score 9 16 19    Mini-Mental    Flowsheet Row Office Visit from 12/17/2023 in [facility] Office Visit from 07/22/2019 in [facility]  Total Score (max 30 points ) 30 29   PHQ2-9    Flowsheet Row Office Visit from 03/04/2024 in [facility] Video Visit from 01/16/2024 in [facility] Video Visit from 12/26/2023 in [facility] Office Visit from 12/17/2023 in [facility] Office Visit from 11/28/2023 in [facility]  PHQ-2 Total Score 1 3 2  0 5  PHQ-9 Total Score -- 8 6 -- 12   Flowsheet Row ED from 07/25/2024 in [facility] Emergency Department at [facility] Office Visit from 03/04/2024 in [facility] ED from 09/28/2023 in [facility] Emergency Department at [facility]  C-SSRS RISK CATEGORY No Risk No Risk No Risk    Collaboration of Care: Collaboration of Care: Medication Management AEB medication prescription and Other provider involved in patient's care AEB ED, cardiology, and neurology chart review  Patient/Guardian was advised Release of Information must be obtained prior to any record release in order to collaborate their care with an outside provider. Patient/Guardian was advised if they have not already done so to contact the registration department to sign all necessary forms in order for us  to release information regarding their care.   Consent: Patient/Guardian gives verbal consent for treatment and assignment of benefits for services provided during this visit. Patient/Guardian expressed understanding and agreed to proceed.    Arvella CHRISTELLA Finder, MD 11/06/2024, 1:54 PM   Virtual Visit via Video Note  I connected with Grayce Pride on 11/06/2024 at  1:30 PM EST by a video enabled telemedicine application and verified that I am speaking with the correct person using two identifiers.  Location: Patient: Home Provider: Home Office   I discussed the limitations of evaluation and management by telemedicine and the availability of in person appointments. The patient expressed understanding and agreed to proceed.   I discussed the assessment and treatment plan with the patient. The patient was provided an opportunity to ask questions and all were answered. The patient agreed with the plan and demonstrated an understanding of the instructions.   The patient was advised to call back or seek an in-person evaluation if the symptoms worsen or if the condition fails to improve as anticipated.  I provided 20 minutes of non-face-to-face time during this encounter.   Arvella CHRISTELLA Finder, MD

## 2024-11-04 ENCOUNTER — Ambulatory Visit (HOSPITAL_BASED_OUTPATIENT_CLINIC_OR_DEPARTMENT_OTHER)
Admission: RE | Admit: 2024-11-04 | Discharge: 2024-11-04 | Disposition: A | Discharge status: Home / Self Care | Source: Ambulatory Visit | Attending: Family | Admitting: Family

## 2024-11-04 DIAGNOSIS — E2839 Other primary ovarian failure: Secondary | ICD-10-CM | POA: Insufficient documentation

## 2024-11-06 ENCOUNTER — Encounter: Payer: Self-pay | Admitting: Student

## 2024-11-06 ENCOUNTER — Encounter (HOSPITAL_COMMUNITY): Payer: Self-pay | Admitting: Psychiatry

## 2024-11-06 ENCOUNTER — Telehealth (HOSPITAL_COMMUNITY): Admitting: Psychiatry

## 2024-11-06 DIAGNOSIS — F3340 Major depressive disorder, recurrent, in remission, unspecified: Secondary | ICD-10-CM

## 2024-11-06 DIAGNOSIS — F411 Generalized anxiety disorder: Secondary | ICD-10-CM

## 2024-11-06 DIAGNOSIS — Z79899 Other long term (current) drug therapy: Secondary | ICD-10-CM | POA: Diagnosis not present

## 2024-11-06 MED ORDER — FLUOXETINE HCL 40 MG PO CAPS: 40.0000 mg | ORAL_CAPSULE | Freq: Every day | ORAL | 0 refills | Status: AC

## 2024-11-07 ENCOUNTER — Ambulatory Visit: Payer: Self-pay | Admitting: Family

## 2024-11-11 ENCOUNTER — Ambulatory Visit (HOSPITAL_COMMUNITY)

## 2024-11-11 DIAGNOSIS — Z79899 Other long term (current) drug therapy: Secondary | ICD-10-CM

## 2024-11-11 NOTE — Progress Notes (Signed)
 Patient arrived today for her due labs. I drew a purple and a tiger top using a 23 G butterfly in the right hand. Patient tolerated well and without complaint

## 2024-11-12 LAB — LIPID PANEL
Chol/HDL Ratio: 2.9 ratio (ref 0.0–4.4)
Cholesterol, Total: 128 mg/dL (ref 100–199)
HDL: 44 mg/dL
LDL Chol Calc (NIH): 62 mg/dL (ref 0–99)
Triglycerides: 122 mg/dL (ref 0–149)
VLDL Cholesterol Cal: 22 mg/dL (ref 5–40)

## 2024-11-12 LAB — HEMOGLOBIN A1C
Est. average glucose Bld gHb Est-mCnc: 105 mg/dL
Hgb A1c MFr Bld: 5.3 % (ref 4.8–5.6)

## 2024-11-16 NOTE — Progress Notes (Unsigned)
 "  Cardiology Office Note:    Date:  11/16/2024   ID:  [name], DOB August 30, 1956, MRN [id number]  PCP:  Sherlynn Madden, MD  Cardiologist:  Oneil Parchment, MD { Click to update primary MD,subspecialty MD or APP then REFRESH:1}    Referring MD: Sherlynn Madden, MD   Chief Complaint: follow-up of chest pain  History of Present Illness:    [name] is a 69 y.o. 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 Parchment and presents today for follow-up of chest pain.   Patient has a history of atypical chest pain and paroxysmal atrial tachycardia in the past. Transcranial dopplers in 07/2019 showed a PFO but not felt to have any indication for closure. No history of stroke. She has been on Aspirin  for this. Myoview  in 02/2022 was low risk with no evidence of ischemia.   She was last seen by me in 07/2024 at which time she reported a funny sensation in her chest when she laid down at night to go to bed as well as rare episodes of chest pressure. She had one these episodes the week prior to her visit with routine activities around the house and it lasted for a couple of hours. Coronary CTA was ordered for further evaluation and showed a coronary calcium  score of 10.7 (48th percentile for age and sex) and minimal atherosclerosis (<25%) of ostial LAD. Carotid dopplers were also ordered vien carotid atherosclerosis noted on recent head imaging and showed mild stenosis (1-39%) of bilateral ICAs.  Patient presents today for follow-up. ***  Minimal Non-Obstructive CAD Coronary CTA in 07/2024 showed a coronary calcium  score of 10.7 (48th percentile for age  and sex) and minimal atherosclerosis (<25%) of ostial LAD. - No chest pain. *** - Continue Aspirin  81mg  daily and Lipitor 10mg  daily.   Paroxysmal Atrial Tachycardia History of PAT.  - Stable. No significant palpitations. *** - Continue Toprol -XL 25mg  daily.    PFO Noted on transcranial dopplers in 2020. This was discussed with Dr. Wonda at that time and no indication for closure.  - Continue Aspirin  81mg  daily.    Mild Carotid Stenosis Carotid dopplers in 07/2024 showed mild stenosis (1-39%) of bilateral ICAs. - Continue aspirin  and statin as above.  Elevated BP without Formal Diagnosis of Hypertension *** BP initially 150/80 in the office and then 144/70 on my personal recheck at the end of the visit. She does not routine monitor her BP at home.  - Asked patient to keep a BP/ HR log for 1-2 weeks and send me a MyChart message with the results.    Hyperlipidemia Lipid panel on 11/11/2024: Total Cholesterol 128, Triglycerides 122, HDL 44, LDL 62. - Continue Lipitor 10mg  daily.    Obstructive Sleep Apnea - Continue CPAP.  EKGs/Labs/Other Studies Reviewed:    The following studies were reviewed:  Coronary CTA 08/12/2024: Impressions: 1. Coronary calcium  score of 10.7. This was 48th percentile for age-, sex, and race-matched controls. 2. Normal coronary origin with right dominance. 3. Minimal atherosclerosis: <25% ostial LAD. 4. Consider non atherosclerotic causes of chest pain. _______________  Carotid Dopplers 08/15/2024: Summary:  - Right Carotid: Velocities in the right ICA are consistent with a 1-39%  stenosis.  - Left Carotid: Velocities in the  left ICA are consistent with a 1-39%  stenosis.  - Vertebrals: Bilateral vertebral arteries demonstrate antegrade flow.  - Subclavians: Normal flow hemodynamics were seen in bilateral subclavian  arteries.    EKG:  EKG not ordered today.   Recent Labs: 11/28/2023: TSH 1.12 08/01/2024: BUN 19; Creatinine, Ser 0.98;  Potassium 4.5; Sodium 140  Recent Lipid Panel    Component Value Date/Time   CHOL 128 11/11/2024 1418   TRIG 122 11/11/2024 1418   HDL 44 11/11/2024 1418   CHOLHDL 2.9 11/11/2024 1418   CHOLHDL 2.9 05/23/2016 0910   VLDL 46 (H) 05/23/2016 0910   LDLCALC 62 11/11/2024 1418    Physical Exam:    Vital Signs: There were no vitals taken for this visit.    Wt Readings from Last 3 Encounters:  08/01/24 155 lb 6.4 oz (70.5 kg)  07/25/24 151 lb (68.5 kg)  03/17/24 156 lb (70.8 kg)     General: 69 y.o. female in no acute distress. HEENT: Normocephalic and atraumatic. Sclera clear.  Neck: Supple. No carotid bruits. No JVD. Heart: *** RRR. Distinct S1 and S2. No murmurs, gallops, or rubs.  Lungs: No increased work of breathing. Clear to ausculation bilaterally. No wheezes, rhonchi, or rales.  Abdomen: Soft, non-distended, and non-tender to palpation.  Extremities: No lower extremity edema.  Radial and distal pedal pulses 2+ and equal bilaterally. Skin: Warm and dry. Neuro: No focal deficits. Psych: Normal affect. Responds appropriately.   Assessment:    No diagnosis found.  Plan:     Disposition: Follow up in ***   Signed, Krisha Beegle E Twila Rappa, PA-C  11/16/2024 12:40 PM    [facility] "

## 2024-11-20 ENCOUNTER — Encounter: Payer: Self-pay | Admitting: Emergency Medicine

## 2024-11-20 ENCOUNTER — Ambulatory Visit

## 2024-11-20 ENCOUNTER — Ambulatory Visit: Attending: Emergency Medicine | Admitting: Emergency Medicine

## 2024-11-20 VITALS — BP 110/50 | HR 61 | Ht 65.0 in | Wt 156.0 lb

## 2024-11-20 DIAGNOSIS — I4719 Other supraventricular tachycardia: Secondary | ICD-10-CM

## 2024-11-20 DIAGNOSIS — E785 Hyperlipidemia, unspecified: Secondary | ICD-10-CM | POA: Diagnosis not present

## 2024-11-20 DIAGNOSIS — I2510 Atherosclerotic heart disease of native coronary artery without angina pectoris: Secondary | ICD-10-CM | POA: Diagnosis not present

## 2024-11-20 DIAGNOSIS — Q2112 Patent foramen ovale: Secondary | ICD-10-CM | POA: Diagnosis not present

## 2024-11-20 DIAGNOSIS — G4733 Obstructive sleep apnea (adult) (pediatric): Secondary | ICD-10-CM | POA: Diagnosis not present

## 2024-11-20 DIAGNOSIS — I6523 Occlusion and stenosis of bilateral carotid arteries: Secondary | ICD-10-CM | POA: Insufficient documentation

## 2024-11-20 DIAGNOSIS — Z79899 Other long term (current) drug therapy: Secondary | ICD-10-CM | POA: Insufficient documentation

## 2024-11-20 NOTE — Progress Notes (Signed)
 " Cardiology Office Note:    Date:  11/20/2024  ID:  [name], DOB [birth year], MRN [id number] PCP: Sherlynn Madden, MD  [facility] Providers Cardiologist:  Oneil Parchment, MD       Patient Profile:       Chief Complaint: 21-month follow-up History of Present Illness:  [name] is a 69 y.o. female with visit-pertinent history of aortic atherosclerosis, paroxysmal atrial tachycardia, PFO, hyperlipidemia, obstructive sleep apnea on CPAP, esophageal spasms, Hashimoto's thyroiditis, anemia, fibromyalgia, chronic fatigue syndrome, restless leg syndrome, endometrial cancer  Patient has a history of atypical chest pain and paroxysmal atrial tachycardia.  Transcranial Dopplers in 07/2019 showed a PFO but not felt to have any indication for closure.  She has no history of stroke and remains on aspirin  for this.  Myoview  in in 02/2022 was low risk with no evidence of ischemia.  Patient was last seen in clinic by [place], [place].  She described rare episodes of chest pressure couple of times over the years.  EKG was without acute ischemic changes.  She was without any significant palpitations.  She underwent coronary CTA on 08/12/2024 with coronary calcium  score 10.7 (48th percentile) with minimal atherosclerosis to ostial LAD.  She underwent carotid duplex on 08/15/2024 consistent with 1-39% stenosis in bilateral ICAs.   Discussed the use of AI scribe software for clinical note transcription with the patient, who gave verbal consent to proceed.  History of Present Illness [name] is a 69 year old female with atrial tachycardia and coronary artery atherosclerosis who presents for 68-month follow-up.  She reports intermittent chest discomfort and palpitations. The chest discomfort is occasional, not clearly exertional, and she is unsure of specific triggers. She notes a fluttering sensation in her chest that feels familiar but more noticeable recently over the past several months.  She  has atrial tachycardia and a patent foramen ovale. Palpitations occur several times daily, last seconds, and are random without relation to activity or rest. She notes an increase in frequency over the past six months.  Prior coronary CTA and carotid ultrasound showed minimal plaque, and a 2023 stress test was normal. She takes a statin and aspirin  for coronary artery atherosclerosis.  She denies any exertional chest discomfort, exertional dyspnea, or syncope.  No orthopnea, PND, lower extremity swelling.  Her family history is notable for strokes in both parents and a sister. She has sleep apnea treated with CPAP and had elevated blood pressure at a prior visit which is normal today.   Review of systems:  Please see the history of present illness. All other systems are reviewed and otherwise negative.      Studies Reviewed:    EKG Interpretation Date/Time:  Thursday November 20 2024 09:50:22 EST Ventricular Rate:  61 PR Interval:  148 QRS Duration:  76 QT Interval:  428 QTC Calculation: 430 R Axis:   73  Text Interpretation: Normal sinus rhythm Normal ECG When compared with ECG of 01-Aug-2024 13:53, No significant change was found Confirmed by Rana Dixon [phone]) on 11/20/2024 12:21:18 PM    Carotid duplex 08/15/2024 Right Carotid: Velocities in the right ICA are consistent with a 1-39%  stenosis.   Left Carotid: Velocities in the left ICA are consistent with a 1-39%  stenosis.   Vertebrals: Bilateral vertebral arteries demonstrate antegrade flow.  Subclavians: Normal flow hemodynamics were seen in bilateral subclavian               arteries.   Coronary CTA 08/12/2024  1. Coronary calcium  score of 10.7. This was 48th percentile for age-, sex, and race-matched controls.   2. Normal coronary origin with right dominance.   3. Minimal atherosclerosis: <25% ostial LAD.   4. Consider non atherosclerotic causes of chest pain.  Lexiscan  Myoview  03/06/2022   The study is  normal. The study is low risk.   No ST deviation was noted.   LV perfusion is normal. There is no evidence of ischemia. There is no evidence of infarction.   Left ventricular function is normal. Nuclear stress EF: 61 %. The left ventricular ejection fraction is normal (55-65%). End diastolic cavity size is normal. End systolic cavity size is normal.   Prior study not available for comparison.  Risk Assessment/Calculations:              Physical Exam:   VS:  BP (!) 110/50 (BP Location: Left Arm, Patient Position: Sitting, Cuff Size: Normal)   Pulse 61   Ht 5' 5 (1.651 m)   Wt 156 lb (70.8 kg)   BMI 25.96 kg/m    Wt Readings from Last 3 Encounters:  11/20/24 156 lb (70.8 kg)  08/01/24 155 lb 6.4 oz (70.5 kg)  07/25/24 151 lb (68.5 kg)    GEN: Well nourished, well developed in no acute distress NECK: No JVD; No carotid bruits CARDIAC: RRR, no murmurs, rubs, gallops RESPIRATORY:  Clear to auscultation without rales, wheezing or rhonchi  ABDOMEN: Soft, non-tender, non-distended EXTREMITIES:  No edema; No acute deformity      Assessment and Plan:  Coronary artery disease Lexiscan  Myoview  02/2022 was normal without evidence of ischemia or infarction Coronary CTA 07/2024 with CAC 10.7 (48th percentile) and minimal atherosclerosis in the ostial LAD - Today patient is stable without anginal symptoms.  She denies exertional chest discomfort.  No indication further ischemic evaluation at this time - Continue aspirin  81 mg daily and atorvastatin  10 mg daily  Paroxysmal atrial tachycardia History of PAT - Today she reports increased frequency of palpitations that occur several times daily, lasts seconds at a time and are random without aggravating factors.  She describes these palpitations as flutters - Would not increase beta-blocking therapy given baseline heart rate of 60 - Will plan for 7-day ZIO to assess PAT burden and rule out episodes of PAF - TSH, BMET, CBC - I set up her  A-fib and heart rate monitor on her Apple watch today in clinic - Continue metoprolol  succinate 25 mg daily  PFO History of PFO noted on transcranial Dopplers in 2020.  Her PFO was discussed with Dr. Wonda at that time and there was no indication for closure - No symptoms of stroke - Continue aspirin  81 mg daily  Carotid artery disease Carotid duplex 07/2024 showed bilateral 1-39% ICA stenosis - No new neurological symptoms today - Continue aspirin  81 mg daily and atorvastatin  10 mg daily - Can repeat Dopplers in 2 years for routine monitoring  Hyperlipidemia, LDL goal <70 LDL 62 on 10/2024 and well-controlled - Continue atorvastatin  10 mg daily  Obstructive sleep apnea - Remains adherent to CPAP therapy      Dispo:  Return in about 3 months (around 02/18/2025).  Signed, Lum LITTIE Louis, NP  "

## 2024-11-20 NOTE — Progress Notes (Unsigned)
 Enrolled for [employer] to mail a ZIO XT long term holter monitor to the patients address on file.   Dr. Jeffrie to read.

## 2024-11-20 NOTE — Patient Instructions (Addendum)
 Medication Instructions:  NO CHANGES  Lab Work: TSH, CBC, AND BMET TO BE DONE TODAY.  Testing/Procedures: GEOFFRY HEWS- Long Term Monitor Instructions  Your physician has requested you wear a ZIO patch monitor for 7 days.  This is a single patch monitor. [employer] supplies one patch monitor per enrollment. Additional stickers are not available. Please do not apply patch if you will be having a Nuclear Stress Test,  Echocardiogram, Cardiac CT, MRI, or Chest Xray during the period you would be wearing the  monitor. The patch cannot be worn during these tests. You cannot remove and re-apply the  ZIO XT patch monitor.  Your ZIO patch monitor will be mailed 3 day USPS to your address on file. It may take 3-5 days  to receive your monitor after you have been enrolled.  Once you have received your monitor, please review the enclosed instructions. Your monitor  has already been registered assigning a specific monitor serial # to you.  Billing and Patient Assistance Program Information  We have supplied [employer] with any of your insurance information on file for billing purposes. [employer] offers a sliding scale Patient Assistance Program for patients that do not have  insurance, or whose insurance does not completely cover the cost of the ZIO monitor.  You must apply for the Patient Assistance Program to qualify for this discounted rate.  To apply, please call [employer] at [phone], select option 4, select option 2, ask to apply for  Patient Assistance Program. Meredeth will ask your household income, and how many people  are in your household. They will quote your out-of-pocket cost based on that information.  [employer] will also be able to set up a 34-month, interest-free payment plan if needed.  Applying the monitor   Shave hair from upper left chest.  Hold abrader disc by orange tab. Rub abrader in 40 strokes over the upper left chest as  indicated in your monitor instructions.  Clean area with 4  enclosed alcohol pads. Let dry.  Apply patch as indicated in monitor instructions. Patch will be placed under collarbone on left  side of chest with arrow pointing upward.  Rub patch adhesive wings for 2 minutes. Remove white label marked 1. Remove the white  label marked 2. Rub patch adhesive wings for 2 additional minutes.  While looking in a mirror, press and release button in center of patch. A small green light will  flash 3-4 times. This will be your only indicator that the monitor has been turned on.  Do not shower for the first 24 hours. You may shower after the first 24 hours.  Press the button if you feel a symptom. You will hear a small click. Record Date, Time and  Symptom in the Patient Logbook.  When you are ready to remove the patch, follow instructions on the last 2 pages of Patient  Logbook. Stick patch monitor onto the last page of Patient Logbook.  Place Patient Logbook in the blue and white box. Use locking tab on box and tape box closed  securely. The blue and white box has prepaid postage on it. Please place it in the mailbox as  soon as possible. Your physician should have your test results approximately 7 days after the  monitor has been mailed back to [facility].  Call [facility] Customer Care at [phone] if you have questions regarding  your ZIO XT patch monitor. Call them immediately if you see an orange light blinking on your  monitor.  If  your monitor falls off in less than 4 days, contact our Monitor department at [phone].  If your monitor becomes loose or falls off after 4 days call [employer] at [phone] for  suggestions on securing your monitor   Follow-Up: At [facility], you and your health needs are our priority.  As part of our continuing mission to provide you with exceptional heart care, our providers are all part of one team.  This team includes your primary Cardiologist (physician) and Advanced Practice Providers or  APPs (Physician Assistants and Nurse Practitioners) who all work together to provide you with the care you need, when you need it.  Your next appointment:   3 MONTHS  Provider:   Oneil Parchment, MD, Lum Louis, DNP, OR Aline Door, [place]   Other Instructions:

## 2024-11-21 ENCOUNTER — Ambulatory Visit: Payer: Self-pay | Admitting: Emergency Medicine

## 2024-11-21 LAB — BASIC METABOLIC PANEL WITH GFR
BUN/Creatinine Ratio: 25 (ref 12–28)
BUN: 24 mg/dL (ref 8–27)
CO2: 24 mmol/L (ref 20–29)
Calcium: 8.4 mg/dL — ABNORMAL LOW (ref 8.7–10.3)
Chloride: 103 mmol/L (ref 96–106)
Creatinine, Ser: 0.95 mg/dL (ref 0.57–1.00)
Glucose: 73 mg/dL (ref 70–99)
Potassium: 4.5 mmol/L (ref 3.5–5.2)
Sodium: 143 mmol/L (ref 134–144)
eGFR: 64 mL/min/1.73

## 2024-11-21 LAB — CBC
Hematocrit: 43.2 % (ref 34.0–46.6)
Hemoglobin: 14.4 g/dL (ref 11.1–15.9)
MCH: 31.3 pg (ref 26.6–33.0)
MCHC: 33.3 g/dL (ref 31.5–35.7)
MCV: 94 fL (ref 79–97)
Platelets: 393 x10E3/uL (ref 150–450)
RBC: 4.6 x10E6/uL (ref 3.77–5.28)
RDW: 11.8 % (ref 11.7–15.4)
WBC: 8.6 x10E3/uL (ref 3.4–10.8)

## 2024-11-21 LAB — TSH: TSH: 1.8 u[IU]/mL (ref 0.450–4.500)

## 2024-11-24 ENCOUNTER — Ambulatory Visit: Admitting: Student

## 2024-12-23 ENCOUNTER — Encounter: Payer: Medicare Other | Admitting: Family

## 2025-01-15 ENCOUNTER — Telehealth (HOSPITAL_COMMUNITY): Admitting: Psychiatry

## 2025-02-18 ENCOUNTER — Ambulatory Visit: Admitting: Emergency Medicine
